# Patient Record
Sex: Female | Born: 1965 | Race: Black or African American | Hispanic: No | Marital: Single | Smoking: Former smoker
Health system: Southern US, Community
[De-identification: ages and names within clinical notes are randomized; demographics above are authoritative.]

## PROBLEM LIST (undated history)

## (undated) DIAGNOSIS — E119 Type 2 diabetes mellitus without complications: Secondary | ICD-10-CM

## (undated) DIAGNOSIS — I10 Essential (primary) hypertension: Secondary | ICD-10-CM

## (undated) DIAGNOSIS — J45909 Unspecified asthma, uncomplicated: Secondary | ICD-10-CM

## (undated) DIAGNOSIS — K529 Noninfective gastroenteritis and colitis, unspecified: Secondary | ICD-10-CM

## (undated) HISTORY — DX: Noninfective gastroenteritis and colitis, unspecified: K52.9

---

## 2019-11-15 DIAGNOSIS — G5603 Carpal tunnel syndrome, bilateral upper limbs: Secondary | ICD-10-CM | POA: Insufficient documentation

## 2019-11-15 DIAGNOSIS — Z720 Tobacco use: Secondary | ICD-10-CM | POA: Insufficient documentation

## 2019-11-15 DIAGNOSIS — J302 Other seasonal allergic rhinitis: Secondary | ICD-10-CM | POA: Insufficient documentation

## 2019-11-15 DIAGNOSIS — G4733 Obstructive sleep apnea (adult) (pediatric): Secondary | ICD-10-CM | POA: Insufficient documentation

## 2019-11-15 DIAGNOSIS — I10 Essential (primary) hypertension: Secondary | ICD-10-CM | POA: Insufficient documentation

## 2019-11-15 DIAGNOSIS — E1169 Type 2 diabetes mellitus with other specified complication: Secondary | ICD-10-CM | POA: Insufficient documentation

## 2019-11-15 DIAGNOSIS — E119 Type 2 diabetes mellitus without complications: Secondary | ICD-10-CM | POA: Insufficient documentation

## 2019-11-15 DIAGNOSIS — G4700 Insomnia, unspecified: Secondary | ICD-10-CM | POA: Insufficient documentation

## 2019-11-15 DIAGNOSIS — J4520 Mild intermittent asthma, uncomplicated: Secondary | ICD-10-CM | POA: Insufficient documentation

## 2020-12-12 DIAGNOSIS — J386 Stenosis of larynx: Secondary | ICD-10-CM

## 2020-12-12 HISTORY — DX: Stenosis of larynx: J38.6

## 2021-09-20 ENCOUNTER — Emergency Department (HOSPITAL_COMMUNITY): Payer: Medicare Other

## 2021-09-20 ENCOUNTER — Encounter (HOSPITAL_COMMUNITY): Payer: Self-pay

## 2021-09-20 ENCOUNTER — Other Ambulatory Visit (HOSPITAL_COMMUNITY): Payer: Medicare Other

## 2021-09-20 ENCOUNTER — Inpatient Hospital Stay (HOSPITAL_COMMUNITY): Payer: Medicare Other

## 2021-09-20 ENCOUNTER — Inpatient Hospital Stay (HOSPITAL_COMMUNITY)
Admission: EM | Admit: 2021-09-20 | Discharge: 2021-11-15 | DRG: 003 | Disposition: A | Discharge status: Home Health | Payer: Medicare Other | Attending: Internal Medicine | Admitting: Internal Medicine

## 2021-09-20 ENCOUNTER — Other Ambulatory Visit: Payer: Self-pay

## 2021-09-20 DIAGNOSIS — R0902 Hypoxemia: Secondary | ICD-10-CM

## 2021-09-20 DIAGNOSIS — Z930 Tracheostomy status: Secondary | ICD-10-CM

## 2021-09-20 DIAGNOSIS — Z79899 Other long term (current) drug therapy: Secondary | ICD-10-CM

## 2021-09-20 DIAGNOSIS — J9601 Acute respiratory failure with hypoxia: Secondary | ICD-10-CM | POA: Diagnosis present

## 2021-09-20 DIAGNOSIS — G9340 Encephalopathy, unspecified: Secondary | ICD-10-CM

## 2021-09-20 DIAGNOSIS — J4551 Severe persistent asthma with (acute) exacerbation: Secondary | ICD-10-CM | POA: Diagnosis not present

## 2021-09-20 DIAGNOSIS — I161 Hypertensive emergency: Secondary | ICD-10-CM | POA: Diagnosis present

## 2021-09-20 DIAGNOSIS — I160 Hypertensive urgency: Secondary | ICD-10-CM | POA: Diagnosis not present

## 2021-09-20 DIAGNOSIS — T380X5A Adverse effect of glucocorticoids and synthetic analogues, initial encounter: Secondary | ICD-10-CM | POA: Diagnosis not present

## 2021-09-20 DIAGNOSIS — J384 Edema of larynx: Secondary | ICD-10-CM | POA: Diagnosis not present

## 2021-09-20 DIAGNOSIS — R0603 Acute respiratory distress: Secondary | ICD-10-CM

## 2021-09-20 DIAGNOSIS — I249 Acute ischemic heart disease, unspecified: Secondary | ICD-10-CM

## 2021-09-20 DIAGNOSIS — N184 Chronic kidney disease, stage 4 (severe): Secondary | ICD-10-CM | POA: Diagnosis present

## 2021-09-20 DIAGNOSIS — J810 Acute pulmonary edema: Secondary | ICD-10-CM | POA: Diagnosis present

## 2021-09-20 DIAGNOSIS — K3184 Gastroparesis: Secondary | ICD-10-CM | POA: Diagnosis not present

## 2021-09-20 DIAGNOSIS — I4510 Unspecified right bundle-branch block: Secondary | ICD-10-CM | POA: Diagnosis present

## 2021-09-20 DIAGNOSIS — E1143 Type 2 diabetes mellitus with diabetic autonomic (poly)neuropathy: Secondary | ICD-10-CM | POA: Diagnosis present

## 2021-09-20 DIAGNOSIS — R140 Abdominal distension (gaseous): Secondary | ICD-10-CM

## 2021-09-20 DIAGNOSIS — Z781 Physical restraint status: Secondary | ICD-10-CM

## 2021-09-20 DIAGNOSIS — D6489 Other specified anemias: Secondary | ICD-10-CM | POA: Diagnosis not present

## 2021-09-20 DIAGNOSIS — G928 Other toxic encephalopathy: Principal | ICD-10-CM | POA: Diagnosis present

## 2021-09-20 DIAGNOSIS — I469 Cardiac arrest, cause unspecified: Secondary | ICD-10-CM | POA: Diagnosis not present

## 2021-09-20 DIAGNOSIS — G4700 Insomnia, unspecified: Secondary | ICD-10-CM | POA: Diagnosis not present

## 2021-09-20 DIAGNOSIS — E1165 Type 2 diabetes mellitus with hyperglycemia: Secondary | ICD-10-CM | POA: Diagnosis present

## 2021-09-20 DIAGNOSIS — J189 Pneumonia, unspecified organism: Secondary | ICD-10-CM | POA: Diagnosis not present

## 2021-09-20 DIAGNOSIS — J440 Chronic obstructive pulmonary disease with acute lower respiratory infection: Secondary | ICD-10-CM | POA: Diagnosis present

## 2021-09-20 DIAGNOSIS — E669 Obesity, unspecified: Secondary | ICD-10-CM | POA: Diagnosis present

## 2021-09-20 DIAGNOSIS — J45901 Unspecified asthma with (acute) exacerbation: Secondary | ICD-10-CM | POA: Diagnosis not present

## 2021-09-20 DIAGNOSIS — R0602 Shortness of breath: Secondary | ICD-10-CM

## 2021-09-20 DIAGNOSIS — Y95 Nosocomial condition: Secondary | ICD-10-CM | POA: Diagnosis present

## 2021-09-20 DIAGNOSIS — F05 Delirium due to known physiological condition: Secondary | ICD-10-CM | POA: Diagnosis not present

## 2021-09-20 DIAGNOSIS — H510 Palsy (spasm) of conjugate gaze: Secondary | ICD-10-CM | POA: Diagnosis present

## 2021-09-20 DIAGNOSIS — Z6838 Body mass index (BMI) 38.0-38.9, adult: Secondary | ICD-10-CM | POA: Diagnosis not present

## 2021-09-20 DIAGNOSIS — Z01818 Encounter for other preprocedural examination: Secondary | ICD-10-CM

## 2021-09-20 DIAGNOSIS — J9503 Malfunction of tracheostomy stoma: Secondary | ICD-10-CM | POA: Diagnosis present

## 2021-09-20 DIAGNOSIS — J4541 Moderate persistent asthma with (acute) exacerbation: Secondary | ICD-10-CM | POA: Diagnosis not present

## 2021-09-20 DIAGNOSIS — E1122 Type 2 diabetes mellitus with diabetic chronic kidney disease: Secondary | ICD-10-CM | POA: Diagnosis present

## 2021-09-20 DIAGNOSIS — I129 Hypertensive chronic kidney disease with stage 1 through stage 4 chronic kidney disease, or unspecified chronic kidney disease: Secondary | ICD-10-CM | POA: Diagnosis present

## 2021-09-20 DIAGNOSIS — R092 Respiratory arrest: Secondary | ICD-10-CM | POA: Diagnosis not present

## 2021-09-20 DIAGNOSIS — R490 Dysphonia: Secondary | ICD-10-CM | POA: Diagnosis not present

## 2021-09-20 DIAGNOSIS — E11649 Type 2 diabetes mellitus with hypoglycemia without coma: Secondary | ICD-10-CM | POA: Diagnosis not present

## 2021-09-20 DIAGNOSIS — B370 Candidal stomatitis: Secondary | ICD-10-CM | POA: Diagnosis present

## 2021-09-20 DIAGNOSIS — J9600 Acute respiratory failure, unspecified whether with hypoxia or hypercapnia: Secondary | ICD-10-CM | POA: Diagnosis not present

## 2021-09-20 DIAGNOSIS — K219 Gastro-esophageal reflux disease without esophagitis: Secondary | ICD-10-CM | POA: Diagnosis present

## 2021-09-20 DIAGNOSIS — R061 Stridor: Secondary | ICD-10-CM | POA: Diagnosis not present

## 2021-09-20 DIAGNOSIS — N179 Acute kidney failure, unspecified: Secondary | ICD-10-CM | POA: Diagnosis not present

## 2021-09-20 DIAGNOSIS — Z4659 Encounter for fitting and adjustment of other gastrointestinal appliance and device: Secondary | ICD-10-CM

## 2021-09-20 DIAGNOSIS — I468 Cardiac arrest due to other underlying condition: Secondary | ICD-10-CM | POA: Diagnosis present

## 2021-09-20 DIAGNOSIS — R1313 Dysphagia, pharyngeal phase: Secondary | ICD-10-CM | POA: Diagnosis not present

## 2021-09-20 DIAGNOSIS — N1832 Chronic kidney disease, stage 3b: Secondary | ICD-10-CM | POA: Diagnosis not present

## 2021-09-20 DIAGNOSIS — N170 Acute kidney failure with tubular necrosis: Secondary | ICD-10-CM | POA: Diagnosis present

## 2021-09-20 DIAGNOSIS — F17210 Nicotine dependence, cigarettes, uncomplicated: Secondary | ICD-10-CM | POA: Diagnosis present

## 2021-09-20 DIAGNOSIS — R339 Retention of urine, unspecified: Secondary | ICD-10-CM | POA: Diagnosis not present

## 2021-09-20 DIAGNOSIS — I152 Hypertension secondary to endocrine disorders: Secondary | ICD-10-CM | POA: Diagnosis not present

## 2021-09-20 DIAGNOSIS — Z0189 Encounter for other specified special examinations: Secondary | ICD-10-CM

## 2021-09-20 DIAGNOSIS — R1312 Dysphagia, oropharyngeal phase: Secondary | ICD-10-CM | POA: Diagnosis not present

## 2021-09-20 DIAGNOSIS — D649 Anemia, unspecified: Secondary | ICD-10-CM | POA: Diagnosis not present

## 2021-09-20 DIAGNOSIS — Z978 Presence of other specified devices: Secondary | ICD-10-CM | POA: Diagnosis not present

## 2021-09-20 DIAGNOSIS — F419 Anxiety disorder, unspecified: Secondary | ICD-10-CM | POA: Diagnosis not present

## 2021-09-20 DIAGNOSIS — Z20822 Contact with and (suspected) exposure to covid-19: Secondary | ICD-10-CM | POA: Diagnosis present

## 2021-09-20 DIAGNOSIS — E875 Hyperkalemia: Secondary | ICD-10-CM | POA: Diagnosis not present

## 2021-09-20 DIAGNOSIS — J45909 Unspecified asthma, uncomplicated: Secondary | ICD-10-CM

## 2021-09-20 DIAGNOSIS — R739 Hyperglycemia, unspecified: Secondary | ICD-10-CM | POA: Diagnosis not present

## 2021-09-20 DIAGNOSIS — E119 Type 2 diabetes mellitus without complications: Secondary | ICD-10-CM | POA: Diagnosis not present

## 2021-09-20 DIAGNOSIS — J383 Other diseases of vocal cords: Secondary | ICD-10-CM | POA: Diagnosis not present

## 2021-09-20 DIAGNOSIS — Z794 Long term (current) use of insulin: Secondary | ICD-10-CM

## 2021-09-20 DIAGNOSIS — J386 Stenosis of larynx: Secondary | ICD-10-CM | POA: Diagnosis present

## 2021-09-20 DIAGNOSIS — E1159 Type 2 diabetes mellitus with other circulatory complications: Secondary | ICD-10-CM | POA: Diagnosis not present

## 2021-09-20 DIAGNOSIS — I10 Essential (primary) hypertension: Secondary | ICD-10-CM | POA: Diagnosis not present

## 2021-09-20 DIAGNOSIS — Z430 Encounter for attention to tracheostomy: Secondary | ICD-10-CM

## 2021-09-20 DIAGNOSIS — R112 Nausea with vomiting, unspecified: Secondary | ICD-10-CM | POA: Diagnosis not present

## 2021-09-20 HISTORY — DX: Type 2 diabetes mellitus without complications: E11.9

## 2021-09-20 HISTORY — DX: Essential (primary) hypertension: I10

## 2021-09-20 HISTORY — DX: Unspecified asthma, uncomplicated: J45.909

## 2021-09-20 HISTORY — DX: Encephalopathy, unspecified: G93.40

## 2021-09-20 LAB — CBC
HCT: 41.5 % (ref 36.0–46.0)
Hemoglobin: 13.3 g/dL (ref 12.0–15.0)
MCH: 28.9 pg (ref 26.0–34.0)
MCHC: 32 g/dL (ref 30.0–36.0)
MCV: 90.2 fL (ref 80.0–100.0)
Platelets: 227 10*3/uL (ref 150–400)
RBC: 4.6 MIL/uL (ref 3.87–5.11)
RDW: 13.1 % (ref 11.5–15.5)
WBC: 14.4 10*3/uL — ABNORMAL HIGH (ref 4.0–10.5)
nRBC: 0 % (ref 0.0–0.2)

## 2021-09-20 LAB — ECHOCARDIOGRAM COMPLETE
AR max vel: 1.92 cm2
AV Area VTI: 2.28 cm2
AV Area mean vel: 1.95 cm2
AV Mean grad: 6 mmHg
AV Peak grad: 11.7 mmHg
Ao pk vel: 1.71 m/s
Area-P 1/2: 2.93 cm2
Height: 66 in
MV VTI: 2.58 cm2
S' Lateral: 2.4 cm
Weight: 3527.36 oz

## 2021-09-20 LAB — COMPREHENSIVE METABOLIC PANEL
ALT: 15 U/L (ref 0–44)
AST: 31 U/L (ref 15–41)
Albumin: 3.1 g/dL — ABNORMAL LOW (ref 3.5–5.0)
Alkaline Phosphatase: 104 U/L (ref 38–126)
Anion gap: 12 (ref 5–15)
BUN: 36 mg/dL — ABNORMAL HIGH (ref 6–20)
CO2: 26 mmol/L (ref 22–32)
Calcium: 9.8 mg/dL (ref 8.9–10.3)
Chloride: 98 mmol/L (ref 98–111)
Creatinine, Ser: 2.17 mg/dL — ABNORMAL HIGH (ref 0.44–1.00)
GFR, Estimated: 26 mL/min — ABNORMAL LOW (ref >60)
Glucose, Bld: 457 mg/dL — ABNORMAL HIGH (ref 70–99)
Potassium: 4.4 mmol/L (ref 3.5–5.1)
Sodium: 136 mmol/L (ref 135–145)
Total Bilirubin: 0.9 mg/dL (ref 0.3–1.2)
Total Protein: 6.9 g/dL (ref 6.5–8.1)

## 2021-09-20 LAB — DIFFERENTIAL
Abs Immature Granulocytes: 0.07 10*3/uL (ref 0.00–0.07)
Basophils Absolute: 0 10*3/uL (ref 0.0–0.1)
Basophils Relative: 0 %
Eosinophils Absolute: 0.1 10*3/uL (ref 0.0–0.5)
Eosinophils Relative: 1 %
Immature Granulocytes: 1 %
Lymphocytes Relative: 19 %
Lymphs Abs: 2.7 10*3/uL (ref 0.7–4.0)
Monocytes Absolute: 0.6 10*3/uL (ref 0.1–1.0)
Monocytes Relative: 4 %
Neutro Abs: 10.9 10*3/uL — ABNORMAL HIGH (ref 1.7–7.7)
Neutrophils Relative %: 75 %

## 2021-09-20 LAB — I-STAT ARTERIAL BLOOD GAS, ED
Acid-Base Excess: 1 mmol/L (ref 0.0–2.0)
Bicarbonate: 26 mmol/L (ref 20.0–28.0)
Calcium, Ion: 1.24 mmol/L (ref 1.15–1.40)
HCT: 41 % (ref 36.0–46.0)
Hemoglobin: 13.9 g/dL (ref 12.0–15.0)
O2 Saturation: 100 %
Patient temperature: 98.9
Potassium: 3.8 mmol/L (ref 3.5–5.1)
Sodium: 135 mmol/L (ref 135–145)
TCO2: 27 mmol/L (ref 22–32)
pCO2 arterial: 40.7 mmHg (ref 32.0–48.0)
pH, Arterial: 7.414 (ref 7.350–7.450)
pO2, Arterial: 230 mmHg — ABNORMAL HIGH (ref 83.0–108.0)

## 2021-09-20 LAB — HEMOGLOBIN A1C
Hgb A1c MFr Bld: 12.7 % — ABNORMAL HIGH (ref 4.8–5.6)
Mean Plasma Glucose: 317.79 mg/dL

## 2021-09-20 LAB — RAPID URINE DRUG SCREEN, HOSP PERFORMED
Amphetamines: NOT DETECTED
Barbiturates: NOT DETECTED
Benzodiazepines: NOT DETECTED
Cocaine: NOT DETECTED
Opiates: NOT DETECTED
Tetrahydrocannabinol: NOT DETECTED

## 2021-09-20 LAB — URINALYSIS, ROUTINE W REFLEX MICROSCOPIC
Bilirubin Urine: NEGATIVE
Bilirubin Urine: NEGATIVE
Glucose, UA: >=500 mg/dL — AB
Glucose, UA: >=500 mg/dL — AB
Ketones, ur: NEGATIVE mg/dL
Ketones, ur: NEGATIVE mg/dL
Leukocytes,Ua: NEGATIVE
Leukocytes,Ua: NEGATIVE
Nitrite: NEGATIVE
Nitrite: NEGATIVE
Protein, ur: >=300 mg/dL — AB
Protein, ur: 100 mg/dL — AB
Specific Gravity, Urine: 1.022 (ref 1.005–1.030)
Specific Gravity, Urine: 1.012 (ref 1.005–1.030)
pH: 6 (ref 5.0–8.0)
pH: 5 (ref 5.0–8.0)

## 2021-09-20 LAB — GLUCOSE, CAPILLARY
Glucose-Capillary: 197 mg/dL — ABNORMAL HIGH (ref 70–99)
Glucose-Capillary: 197 mg/dL — ABNORMAL HIGH (ref 70–99)
Glucose-Capillary: 217 mg/dL — ABNORMAL HIGH (ref 70–99)
Glucose-Capillary: 219 mg/dL — ABNORMAL HIGH (ref 70–99)
Glucose-Capillary: 230 mg/dL — ABNORMAL HIGH (ref 70–99)
Glucose-Capillary: 293 mg/dL — ABNORMAL HIGH (ref 70–99)
Glucose-Capillary: 330 mg/dL — ABNORMAL HIGH (ref 70–99)
Glucose-Capillary: 376 mg/dL — ABNORMAL HIGH (ref 70–99)
Glucose-Capillary: 399 mg/dL — ABNORMAL HIGH (ref 70–99)
Glucose-Capillary: 438 mg/dL — ABNORMAL HIGH (ref 70–99)
Glucose-Capillary: 466 mg/dL — ABNORMAL HIGH (ref 70–99)
Glucose-Capillary: 516 mg/dL (ref 70–99)

## 2021-09-20 LAB — CBG MONITORING, ED
Glucose-Capillary: 477 mg/dL — ABNORMAL HIGH (ref 70–99)
Glucose-Capillary: 470 mg/dL — ABNORMAL HIGH (ref 70–99)
Glucose-Capillary: 547 mg/dL (ref 70–99)

## 2021-09-20 LAB — PROTIME-INR
INR: 0.9 (ref 0.8–1.2)
Prothrombin Time: 12.4 seconds (ref 11.4–15.2)

## 2021-09-20 LAB — HIV ANTIBODY (ROUTINE TESTING W REFLEX): HIV Screen 4th Generation wRfx: NONREACTIVE

## 2021-09-20 LAB — AMMONIA: Ammonia: 30 umol/L (ref 9–35)

## 2021-09-20 LAB — I-STAT CHEM 8, ED
BUN: 52 mg/dL — ABNORMAL HIGH (ref 6–20)
Calcium, Ion: 1.08 mmol/L — ABNORMAL LOW (ref 1.15–1.40)
Chloride: 104 mmol/L (ref 98–111)
Creatinine, Ser: 2.2 mg/dL — ABNORMAL HIGH (ref 0.44–1.00)
Glucose, Bld: 458 mg/dL — ABNORMAL HIGH (ref 70–99)
HCT: 41 % (ref 36.0–46.0)
Hemoglobin: 13.9 g/dL (ref 12.0–15.0)
Potassium: 4.6 mmol/L (ref 3.5–5.1)
Sodium: 136 mmol/L (ref 135–145)
TCO2: 28 mmol/L (ref 22–32)

## 2021-09-20 LAB — I-STAT BETA HCG BLOOD, ED (MC, WL, AP ONLY): I-stat hCG, quantitative: <5 m[IU]/mL (ref <5)

## 2021-09-20 LAB — ETHANOL: Alcohol, Ethyl (B): <10 mg/dL (ref <10)

## 2021-09-20 LAB — APTT: aPTT: 27 seconds (ref 24–36)

## 2021-09-20 LAB — MRSA NEXT GEN BY PCR, NASAL: MRSA by PCR Next Gen: NOT DETECTED

## 2021-09-20 LAB — OSMOLALITY: Osmolality: 333 mOsm/kg (ref 275–295)

## 2021-09-20 LAB — RESP PANEL BY RT-PCR (FLU A&B, COVID) ARPGX2
Influenza A by PCR: NEGATIVE
Influenza B by PCR: NEGATIVE
SARS Coronavirus 2 by RT PCR: NEGATIVE

## 2021-09-20 LAB — CK: Total CK: 316 U/L — ABNORMAL HIGH (ref 38–234)

## 2021-09-20 LAB — BETA-HYDROXYBUTYRIC ACID: Beta-Hydroxybutyric Acid: 0.36 mmol/L — ABNORMAL HIGH (ref 0.05–0.27)

## 2021-09-20 LAB — MAGNESIUM: Magnesium: 1.9 mg/dL (ref 1.7–2.4)

## 2021-09-20 MED ORDER — AMLODIPINE BESYLATE 5 MG PO TABS
5.0000 mg | ORAL_TABLET | Freq: Every day | ORAL | Status: DC
  Administered 2021-09-20: 5 mg
  Filled 2021-09-20: qty 1

## 2021-09-20 MED ORDER — DOCUSATE SODIUM 50 MG/5ML PO LIQD
100.0000 mg | Freq: Two times a day (BID) | ORAL | Status: DC | PRN
  Administered 2021-09-22: 100 mg
  Filled 2021-09-20: qty 10

## 2021-09-20 MED ORDER — ETOMIDATE 2 MG/ML IV SOLN
30.0000 mg | Freq: Once | INTRAVENOUS | Status: AC
  Administered 2021-09-20: 30 mg via INTRAVENOUS
  Filled 2021-09-20: qty 20

## 2021-09-20 MED ORDER — ORAL CARE MOUTH RINSE
15.0000 mL | OROMUCOSAL | Status: DC
  Administered 2021-09-20 – 2021-09-25 (×53): 15 mL via OROMUCOSAL

## 2021-09-20 MED ORDER — INSULIN REGULAR(HUMAN) IN NACL 100-0.9 UT/100ML-% IV SOLN
INTRAVENOUS | Status: AC
  Administered 2021-09-20: 5 [IU]/h via INTRAVENOUS
  Administered 2021-09-21: 4 [IU]/h via INTRAVENOUS
  Filled 2021-09-20 (×3): qty 100

## 2021-09-20 MED ORDER — FENTANYL CITRATE PF 50 MCG/ML IJ SOSY
100.0000 ug | PREFILLED_SYRINGE | INTRAMUSCULAR | Status: DC | PRN
  Administered 2021-09-20: 100 ug via INTRAVENOUS
  Filled 2021-09-20: qty 2

## 2021-09-20 MED ORDER — LORAZEPAM 2 MG/ML IJ SOLN
2.0000 mg | Freq: Once | INTRAMUSCULAR | Status: AC
  Administered 2021-09-20: 2 mg via INTRAVENOUS
  Filled 2021-09-20: qty 1

## 2021-09-20 MED ORDER — NICARDIPINE HCL IN NACL 20-0.86 MG/200ML-% IV SOLN
3.0000 mg/h | INTRAVENOUS | Status: DC
Start: 2021-09-20 — End: 2021-09-21
  Administered 2021-09-20: 5 mg/h via INTRAVENOUS
  Administered 2021-09-20: 2.5 mg/h via INTRAVENOUS
  Administered 2021-09-20 (×2): 12.5 mg/h via INTRAVENOUS
  Filled 2021-09-20 (×6): qty 200

## 2021-09-20 MED ORDER — ONDANSETRON HCL 4 MG/2ML IJ SOLN
4.0000 mg | Freq: Four times a day (QID) | INTRAMUSCULAR | Status: DC | PRN
  Administered 2021-10-09 – 2021-11-14 (×8): 4 mg via INTRAVENOUS
  Filled 2021-09-20 (×9): qty 2

## 2021-09-20 MED ORDER — POLYETHYLENE GLYCOL 3350 17 G PO PACK: 17.0000 g | PACK | Freq: Every day | ORAL | Status: DC | PRN

## 2021-09-20 MED ORDER — ACETAMINOPHEN 325 MG PO TABS
650.0000 mg | ORAL_TABLET | Freq: Four times a day (QID) | ORAL | Status: DC | PRN
  Filled 2021-09-20: qty 2

## 2021-09-20 MED ORDER — PROPOFOL 1000 MG/100ML IV EMUL
0.0000 ug/kg/min | INTRAVENOUS | Status: DC
  Administered 2021-09-20: 5 ug/kg/min via INTRAVENOUS
  Administered 2021-09-20: 40 ug/kg/min via INTRAVENOUS
  Administered 2021-09-20 – 2021-09-21 (×3): 30 ug/kg/min via INTRAVENOUS
  Filled 2021-09-20: qty 200
  Filled 2021-09-20 (×5): qty 100

## 2021-09-20 MED ORDER — DEXTROSE 50 % IV SOLN
0.0000 mL | INTRAVENOUS | Status: DC | PRN
  Administered 2021-09-24 – 2021-10-13 (×3): 50 mL via INTRAVENOUS
  Filled 2021-09-20 (×5): qty 50

## 2021-09-20 MED ORDER — ACETAMINOPHEN 325 MG PO TABS
650.0000 mg | ORAL_TABLET | Freq: Four times a day (QID) | ORAL | Status: DC | PRN
  Administered 2021-09-20 – 2021-09-21 (×3): 650 mg
  Filled 2021-09-20 (×2): qty 2

## 2021-09-20 MED ORDER — LABETALOL HCL 5 MG/ML IV SOLN
5.0000 mg | Freq: Once | INTRAVENOUS | Status: AC
  Administered 2021-09-20: 5 mg via INTRAVENOUS
  Filled 2021-09-20: qty 4

## 2021-09-20 MED ORDER — LABETALOL HCL 5 MG/ML IV SOLN
10.0000 mg | Freq: Once | INTRAVENOUS | Status: AC
  Administered 2021-09-20: 10 mg via INTRAVENOUS

## 2021-09-20 MED ORDER — DEXTROSE IN LACTATED RINGERS 5 % IV SOLN: INTRAVENOUS | Status: DC

## 2021-09-20 MED ORDER — AMLODIPINE BESYLATE 10 MG PO TABS
10.0000 mg | ORAL_TABLET | Freq: Every day | ORAL | Status: DC
  Administered 2021-09-21 – 2021-09-24 (×4): 10 mg
  Filled 2021-09-20 (×4): qty 1

## 2021-09-20 MED ORDER — PANTOPRAZOLE 2 MG/ML SUSPENSION
40.0000 mg | Freq: Every day | ORAL | Status: DC
  Administered 2021-09-20 – 2021-09-24 (×5): 40 mg
  Filled 2021-09-20 (×5): qty 20

## 2021-09-20 MED ORDER — CARVEDILOL 12.5 MG PO TABS: 12.5000 mg | ORAL_TABLET | Freq: Two times a day (BID) | ORAL | Status: DC

## 2021-09-20 MED ORDER — ROCURONIUM BROMIDE 10 MG/ML (PF) SYRINGE
PREFILLED_SYRINGE | INTRAVENOUS | Status: AC
  Administered 2021-09-20: 100 mg via INTRAVENOUS
  Filled 2021-09-20: qty 10

## 2021-09-20 MED ORDER — HEPARIN SODIUM (PORCINE) 5000 UNIT/ML IJ SOLN
5000.0000 [IU] | Freq: Three times a day (TID) | INTRAMUSCULAR | Status: DC
  Administered 2021-09-20 – 2021-10-14 (×74): 5000 [IU] via SUBCUTANEOUS
  Filled 2021-09-20 (×73): qty 1

## 2021-09-20 MED ORDER — ZIPRASIDONE MESYLATE 20 MG IM SOLR
INTRAMUSCULAR | Status: AC
  Administered 2021-09-20: 20 mg via INTRAMUSCULAR
  Filled 2021-09-20: qty 20

## 2021-09-20 MED ORDER — CHLORHEXIDINE GLUCONATE CLOTH 2 % EX PADS
6.0000 | MEDICATED_PAD | Freq: Every day | CUTANEOUS | Status: DC
Start: 2021-09-20 — End: 2021-11-08
  Administered 2021-09-20 – 2021-11-07 (×46): 6 via TOPICAL

## 2021-09-20 MED ORDER — FENTANYL CITRATE PF 50 MCG/ML IJ SOSY
50.0000 ug | PREFILLED_SYRINGE | INTRAMUSCULAR | Status: DC | PRN
  Administered 2021-09-20: 50 ug via INTRAVENOUS
  Filled 2021-09-20: qty 1

## 2021-09-20 MED ORDER — MIDAZOLAM HCL 2 MG/2ML IJ SOLN
2.0000 mg | INTRAMUSCULAR | Status: DC | PRN
Start: 2021-09-20 — End: 2021-09-20
  Filled 2021-09-20: qty 2

## 2021-09-20 MED ORDER — ROCURONIUM BROMIDE 10 MG/ML (PF) SYRINGE: 1.0000 mg/kg | PREFILLED_SYRINGE | Freq: Once | INTRAVENOUS | Status: AC

## 2021-09-20 MED ORDER — ZIPRASIDONE MESYLATE 20 MG IM SOLR: 20.0000 mg | Freq: Once | INTRAMUSCULAR | Status: AC

## 2021-09-20 MED ORDER — SODIUM CHLORIDE 0.9% FLUSH
3.0000 mL | Freq: Once | INTRAVENOUS | Status: AC
  Administered 2021-09-20: 3 mL via INTRAVENOUS

## 2021-09-20 MED ORDER — LACTATED RINGERS IV SOLN: INTRAVENOUS | Status: DC

## 2021-09-20 MED ORDER — FENTANYL CITRATE PF 50 MCG/ML IJ SOSY
100.0000 ug | PREFILLED_SYRINGE | INTRAMUSCULAR | Status: DC | PRN
  Administered 2021-09-21 (×2): 100 ug via INTRAVENOUS
  Filled 2021-09-20 (×4): qty 2

## 2021-09-20 MED ORDER — CHLORHEXIDINE GLUCONATE 0.12% ORAL RINSE (MEDLINE KIT)
15.0000 mL | Freq: Two times a day (BID) | OROMUCOSAL | Status: DC
  Administered 2021-09-20 – 2021-09-25 (×11): 15 mL via OROMUCOSAL

## 2021-09-20 MED ORDER — CARVEDILOL 12.5 MG PO TABS
12.5000 mg | ORAL_TABLET | Freq: Two times a day (BID) | ORAL | Status: DC
  Administered 2021-09-20: 12.5 mg
  Filled 2021-09-20: qty 1

## 2021-09-20 MED ORDER — FUROSEMIDE 10 MG/ML IJ SOLN
60.0000 mg | Freq: Once | INTRAMUSCULAR | Status: AC
  Administered 2021-09-20: 60 mg via INTRAVENOUS
  Filled 2021-09-20: qty 6

## 2021-09-20 MED ORDER — MIDAZOLAM HCL 2 MG/2ML IJ SOLN
2.0000 mg | INTRAMUSCULAR | Status: DC | PRN
  Administered 2021-09-20 – 2021-09-23 (×9): 2 mg via INTRAVENOUS
  Filled 2021-09-20 (×9): qty 2

## 2021-09-20 MED ORDER — FENTANYL CITRATE PF 50 MCG/ML IJ SOSY: 100.0000 ug | PREFILLED_SYRINGE | INTRAMUSCULAR | Status: DC | PRN

## 2021-09-20 MED ORDER — SODIUM CHLORIDE 0.9 % IV SOLN
2.0000 g | INTRAVENOUS | Status: DC
  Filled 2021-09-20 (×2): qty 20

## 2021-09-20 NOTE — Consult Note (Signed)
NEURO HOSPITALIST CONSULT NOTE   Requestig physician: Dr. Dayna Barker  Reason for Consult: Bizarre behavior  History obtained from:  EMS and Chart     HPI:                                                                                                                                          Maureen Jones is an 55 y.o. female who presented to the ED via EMS from home for assessment of bizarre, fluctuating behavior. LKN was 0200 when she was outside smoking with a family member (unknown what type of smoking implement or substance(s) used). She then went inside and family heard a loud boom coming from the room she was in. The patient was then found to be confused, pulling her clothes off and combative. EMS was called and on arrival to the home, the patient was unable to answer questions and seemed to be favoring her left side.   SBP was 166 on scene. HR 88. O2 saturation normal. CBG 456.   History reviewed. No pertinent past medical history.  History reviewed. No pertinent surgical history.  No family history on file.          Social History:  has no history on file for tobacco use, alcohol use, and drug use.  No Known Allergies  HOME MEDICATIONS:                                                                                                                     Unknown  ROS:  Unable to obtain due to AMS.    Blood pressure (!) 215/88, pulse 90, resp. rate (!) 33, weight 100 kg, SpO2 96 %.   General Examination:                                                                                                       Physical Exam  HEENT-  Normocephalic   Lungs- Respirations unlabored Extremities- No edema  Neurological Examination Mental Status: Initially patient with eyes closed and not answering questions or following commands,  appearing to have a depressed level of consciousness. After sustained sternal rub, patient opens eyes and starts exclaiming with a combined agitated/excited and sedated affect. She does not directly answer questions while in this state, but her exclamations are with intact grammar and syntax. Mild dysarthria which has quality that is suggestive of sedation. Not following commands when awakened and agitated.   Cranial Nerves: II: Does track. Briefly fixated on examiner after sternal rub. No definite blink to threat.  III,IV, VI: No ptosis. Eyes conjugate. No forced gaze deviation. Unable to formally assess EOM.  V: Unable to formally assess facial sensation.  VII: Face is symmetric at rest and when agitated.  VIII: Not responding to questions or commands IX,X: No hypophonia or hoarseness XI: Unable to assess. Will turn head to left and right while agitated XII: Does not protrude tongue to command. No lingual dysarthria.  Motor: Not following commands.  Moves BUE and BLE equally and forcefully when agitated.  Tone normal BUE and BLE when initially in a calm, unresponsive state.  Muscle bulk normal and symmetric x 4.  When agitated, was able to rise up to a seated position abruptly and rapidly without assistance, while on the CT scanner table   Sensory: Reacts to stimuli x 4.  Deep Tendon Reflexes: Unable to obtain due to agitation and thrashing. Toes downgoing bilaterally when initially not responding Cerebellar: Unable to formally assess. Spontaneous movements without ataxia.  Gait: Unable to assess   Lab Results: Basic Metabolic Panel: Recent Labs  Lab 09/20/21 0323 09/20/21 0330  NA 136 136  K 4.4 4.6  CL 98 104  CO2 26  --   GLUCOSE 457* 458*  BUN 36* 52*  CREATININE 2.17* 2.20*  CALCIUM 9.8  --     CBC: Recent Labs  Lab 09/20/21 0323 09/20/21 0330  WBC 14.4*  --   NEUTROABS 10.9*  --   HGB 13.3 13.9  HCT 41.5 41.0  MCV 90.2  --   PLT 227  --     Cardiac  Enzymes: No results for input(s): CKTOTAL, CKMB, CKMBINDEX, TROPONINI in the last 168 hours.  Lipid Panel: No results for input(s): CHOL, TRIG, HDL, CHOLHDL, VLDL, LDLCALC in the last 168 hours.  Imaging: CT HEAD CODE STROKE WO CONTRAST  Result Date: 09/20/2021 CLINICAL DATA:  Code stroke.  Right-sided weakness EXAM: CT HEAD WITHOUT CONTRAST TECHNIQUE: Contiguous axial images were obtained from the base of the skull through the vertex without intravenous contrast. COMPARISON:  None. FINDINGS: Brain: There is no mass, hemorrhage or extra-axial  collection. The size and configuration of the ventricles and extra-axial CSF spaces are normal. 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 Vascular: No abnormal hyperdensity of the major intracranial arteries or dural venous sinuses. No intracranial atherosclerosis. Skull: The visualized skull base, calvarium and extracranial soft tissues are normal. Sinuses/Orbits: No fluid levels or advanced mucosal thickening of the visualized paranasal sinuses. No mastoid or middle ear effusion. The orbits are normal. ASPECTS [facility] Stroke Program Early CT Score) - Ganglionic level infarction (caudate, lentiform nuclei, internal capsule, insula, M1-M3 cortex): 7 - Supraganglionic infarction (M4-M6 cortex): 3 Total score (0-10 with 10 being normal): 10 IMPRESSION: 1. No acute intracranial abnormality. 2. 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 3. ASPECTS is 10. These results were communicated to Dr. Kerney Elbe at 3:32 am on 09/20/2021 by text page via the [facility] messaging system. Electronically Signed   By: Ulyses Jarred M.D.   On: 09/20/2021 03:34     Assessment: 55 year old female presenting as a Code Stroke with acute onset of agitation with bizarre behavior 1. Exam reveals findings most consistent with an agitated delirium. DDx includes illicit substance intoxication and psychotic break. Low on the DDx but also possible would be an atypical  presentation of seizure. No focal weakness noted. Also with no tremor, jerking, posturing or twitching.   2. CT head: No acute intracranial abnormality. 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 ASPECTS is 10.  3. Overall presentation not consistent with stroke. No ataxia, ocular motility defect or pupillary abnormalities to suggest an unusual presentation of top of basilar syndrome or an artery of [place]on infarction. Risks of IV thrombolysis significantly outweigh potential benefits.   Recommendations: 1. [place] toxicology 2. Close monitoring with frequent neuro checks 3. May require sedation.  4. EEG 5. MRI brain when able.    Electronically signed: Dr. Kerney Elbe 09/20/2021, 4:19 AM

## 2021-09-20 NOTE — ED Triage Notes (Signed)
Pt arrives [facility] after family member called out for bizarre behavior. Per family pt went outside at 77 to smoke a cigarette with her nephew. Immediately afterwards pt went inside and family heard a "loud boom". Family then found pt confused, pulling clothes off and combative. Upon EMS arrival pt unable to answer questions and favoring her left extremities.

## 2021-09-20 NOTE — Progress Notes (Signed)
EEG done at bedside. Results pending.  

## 2021-09-20 NOTE — Code Documentation (Addendum)
Stroke Response Nurse Documentation Code Documentation  Maureen Jones is a 55 y.o. female arriving to [facility] ED via [facility] on 10/10 with past medical hx of HTN, DM. On No antithrombotic. Code stroke was activated by EMS.   Patient from home where she was LKW at 0200 after reportedly smoking a cigarette and now complaining of AMS, confusion. Pt is agitated and uncooperative making for a difficult exam. She is not following commands, however localizing to noxious stimuli bilaterally and stating responses like "that hurts" when stimulated.   Stroke team at the bedside on patient arrival. Labs drawn and patient cleared for CT by Dr. Dayna Barker. Patient to CT with team. NIHSS 4, see documentation for details and code stroke times. Patient with disoriented and not following commands on exam. The following imaging  was completed:  CT. Patient required frequent direction and reorientation for exam including medications to complete CT. Patient is not a candidate for IV Thrombolytic and not a candidate for IR due to No LVO symptoms.   Bedside handoff with ED RN Ysidro Evert.    Madelynn Done  Rapid Response RN

## 2021-09-20 NOTE — Procedures (Signed)
Patient Name: Maureen Jones  MRN: [id number]  Epilepsy Attending: Lora Havens  Referring Physician/Provider: Noe Gens, NP Date: 09/20/2021 Duration: 24.35 mins  Patient history: 55yo F with ams. EEG to evaluate for seizure  Level of alertness: comatose  AEDs during EEG study: Propofol  Technical aspects: This EEG study was done with scalp electrodes positioned according to the 10-20 International system of electrode placement. Electrical activity was acquired at a sampling rate of '500Hz'$  and reviewed with a high frequency filter of '70Hz'$  and a low frequency filter of '1Hz'$ . EEG data were recorded continuously and digitally stored.   Description: EEG showed continuous generalized background attenuation. Hyperventilation and photic stimulation were not performed.     Of note, EEG was technically difficult due to significant myogenic artifact.  ABNORMALITY - Background attenuation, generalized  IMPRESSION: This technically difficult study is suggestive of severe to profound diffuse encephalopathy, nonspecific etiology. No seizures or epileptiform discharges were seen throughout the recording.  If suspicion for ictal- interictal activity remains a concern, a prolonged study can be considered.   Camp [name]

## 2021-09-20 NOTE — Progress Notes (Signed)
Pt transported via vent to 4N to MRI & back with complications noted.

## 2021-09-20 NOTE — ED Notes (Signed)
Pt refused CXR and became combative with radiology.

## 2021-09-20 NOTE — Progress Notes (Signed)
  Echocardiogram 2D Echocardiogram has been performed.  Maureen Jones 09/20/2021, 5:10 PM

## 2021-09-20 NOTE — Progress Notes (Signed)
Belongings brought up with patient: shirt, underwear.

## 2021-09-20 NOTE — Progress Notes (Signed)
Inpatient Diabetes Program Recommendations  AACE/ADA: New Consensus Statement on Inpatient Glycemic Control (2015)  Target Ranges:  Prepandial:   less than 140 mg/dL      Peak postprandial:   less than 180 mg/dL (1-2 hours)      Critically ill patients:  140 - 180 mg/dL   Lab Results  Component Value Date   GLUCAP 470 (H) 09/20/2021    Review of Glycemic Control  Diabetes history: DM2 Outpatient Diabetes medications: Basaglar 60 units am, 65 units pm, Novolog 15 units tid meal coverage, 5 units ac smoothies, Metformin 850 mg bid Current orders for Inpatient glycemic control: IV insulin  Inpatient Diabetes Program Recommendations:   Agree with plan to start on IV insulin. Will follow regarding transition. See above for home regimen of insulin.  Thank you, Nani Gasser. Marquee Fuchs, RN, MSN, CDE  Diabetes Coordinator Inpatient Glycemic Control Team Team Pager [phone] (8am-5pm) 09/20/2021 9:56 AM

## 2021-09-20 NOTE — Plan of Care (Signed)
Neurology Plan of Care with addition to consult:  We were paged by the RN because patient had a downward gaze with a few slow beats of nystagmus in the OS. Asked RN to get the stat MRI brain ordered prior and if there was a delay with that, get a stat CT head. MRI was able to be done. Dr. Theda Sers reviewed MRI brain with no acute changes noted. Final read also showed no acute finding.  We examined patient's eyes. Downward gaze OU was found with a few slow beats of nystagmus with fatigability to OS. No new findings on MRIb to suggest emergent concerns, but will get MRA head and neck to assess intra/extracranial arteries and vascular deformities. This could also be metabolic issue.   Plan: MRA head and neck. Neuro will f/up results.    Clance Boll, MSN, APN-BC Neurology Nurse Practitioner Pager [phone]  Above discussed with and approved by Dr. Corine Shelter.

## 2021-09-20 NOTE — ED Notes (Signed)
Pt's sister called to get an update on patient, Maureen Jones [phone]

## 2021-09-20 NOTE — ED Provider Notes (Signed)
Blood pressure (!) 200/81, pulse (!) 112, temperature 98.8 F (37.1 C), temperature source Axillary, resp. rate (!) 24, height '5\' 6"'$  (1.676 m), weight 100 kg, SpO2 98 %.  Assuming care from Dr. Dayna Barker.  In short, Maureen Jones is a 55 y.o. female with a chief complaint of Code Stroke .  Refer to the original H&P for additional details.  The current plan of care is to follow up MRI. ICU team made aware and will follow/admit after MRI.     Margette Fast, MD 09/20/21 [phone]

## 2021-09-20 NOTE — H&P (Signed)
NAMETomecka Jones, MRN:  [id number], DOB:  1966/05/08, LOS: 0 ADMISSION DATE:  09/20/2021, CONSULTATION DATE: 10/10 REFERRING MD:  Dr. Laverta Baltimore, CHIEF COMPLAINT:  AMS   History of Present Illness:  55 y/o F who presented to [facility] on 10/10 with reports of acute altered mental status.   She was reportedly at home and went to smoke a cigarette 0200 with her nephew. She was apparently seen by her family at that time.  After smoking, the family heard a loud thud and she was found altered and confused.  On arrival to the ER she was noted to be agitated, confused, localized to pain and stated "that hurts" with stimulation.  She was seen initially as a CODE STROKE.  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 She was hypertensive with initial pressure of 240/112 with MAP of 136.  Initial labs showed glucose of 437, BUN 36, Sr CR 2.17, CK 316, ammonia 30, Mg 1.9, WBC 14.4. UDS was negative.  COVID & flu screening were negative.  CXR showed low lung volumes, ETT in good position, L>R interstitial opacities.     PCCM called for ICU admission.   Pertinent  Medical History  HTN DM   Significant Hospital Events: Including procedures, antibiotic start and stop dates in addition to other pertinent events   10/10 Admit with acute encephalopathy, hypertensive  Interim History / Subjective:  As above   Objective   Blood pressure (!) 143/84, pulse (!) 109, temperature 98.8 F (37.1 C), temperature source Axillary, resp. rate 19, height '5\' 6"'$  (1.676 m), weight 100 kg, SpO2 96 %.    Vent Mode: PRVC FiO2 (%):  [40 %-100 %] 40 % Set Rate:  [16 bmp-24 bmp] 16 bmp Vt Set:  [470 mL] 470 mL PEEP:  [5 cmH20] 5 cmH20 Plateau Pressure:  [14 cmH20-20 cmH20] 20 cmH20  No intake or output data in the 24 hours ending 09/20/21 0937 Filed Weights   09/20/21 0326  Weight: 100 kg    Examination: General: ill appearing adult female lying in bed on  vent HENT: ETT, pupils 80m, anicteric Lungs: non-labored on vent, lungs bilaterally coarse with rhonchi Cardiovascular: S1S2 RRR, no m/r/g Abdomen: protuberant, soft, bsx4 active  Extremities: warm/dry, no edema  Neuro: sedate on propofol, moves all extremities spontaneously, does not follow commands  Resolved Hospital Problem list     Assessment & Plan:   Acute Hypertensive Emergency  Initial BP 240/112, MAP 136  -admit to ICU -cardene gtt for MAP reduction of 25% 6-24h, goal MAP ~100 -avoid hypotension -add oral amlodipine to wean cardene gtt -assess ECHO given cardiomegaly on CXR  Acute Metabolic Encephalopathy  Suspect in setting of hypertensive emergency, possible PRES. CT head negative.  -MRI to rule out PRES -propofol for sedation in setting of hypertension -correct hyperglycemia  -frequent neuro exams  Acute Hypoxic Respiratory Failure  Rule out pulmonary edema vs Aspiration  Tobacco Abuse Low lung volumes on CXR -PRVC 8cc/kg -wean PEEP / fiO2 for sats >90% -follow CXR -lasix as below  -empiric rocephin for possible aspiration  -cessation counseling when able for tobacco abuse   AKI  10/2020 cr 1.7 -lasix 60 mg IV x1  -Trend BMP / urinary output -Replace electrolytes as indicated -Avoid nephrotoxic agents as able, ensure adequate renal perfusion  DM with Hyperglycemia Not DKA, doubt HHS given glucose.  Suspect uncontrolled DM with stress response.  -assess HgbA1c -begin insulin gtt -assess beta  hydroxy  Best Practice (right click and "Reselect all SmartList Selections" daily)  Diet/type: tubefeeds DVT prophylaxis: systemic heparin GI prophylaxis: PPI Lines: N/A Foley:  N/A Code Status:  full code Last date of multidisciplinary goals of care discussion: pending   Labs   CBC: Recent Labs  Lab 09/20/21 0323 09/20/21 0330 09/20/21 0715  WBC 14.4*  --   --   NEUTROABS 10.9*  --   --   HGB 13.3 13.9 13.9  HCT 41.5 41.0 41.0  MCV 90.2  --   --    PLT 227  --   --     Basic Metabolic Panel: Recent Labs  Lab 09/20/21 0323 09/20/21 0330 09/20/21 0644 09/20/21 0715  NA 136 136  --  135  K 4.4 4.6  --  3.8  CL 98 104  --   --   CO2 26  --   --   --   GLUCOSE 457* 458*  --   --   BUN 36* 52*  --   --   CREATININE 2.17* 2.20*  --   --   CALCIUM 9.8  --   --   --   MG  --   --  1.9  --    GFR: Estimated Creatinine Clearance: 34.9 mL/min (A) (by C-G formula based on SCr of 2.2 mg/dL (H)). Recent Labs  Lab 09/20/21 0323  WBC 14.4*    Liver Function Tests: Recent Labs  Lab 09/20/21 0323  AST 31  ALT 15  ALKPHOS 104  BILITOT 0.9  PROT 6.9  ALBUMIN 3.1*   No results for input(s): LIPASE, AMYLASE in the last 168 hours. Recent Labs  Lab 09/20/21 0644  AMMONIA 30    ABG    Component Value Date/Time   PHART 7.414 09/20/2021 0715   PCO2ART 40.7 09/20/2021 0715   PO2ART 230 (H) 09/20/2021 0715   HCO3 26.0 09/20/2021 0715   TCO2 27 09/20/2021 0715   O2SAT 100.0 09/20/2021 0715     Coagulation Profile: Recent Labs  Lab 09/20/21 0323  INR 0.9    Cardiac Enzymes: Recent Labs  Lab 09/20/21 0611  CKTOTAL 316*    HbA1C: No results found for: HGBA1C  CBG: Recent Labs  Lab 09/20/21 0321 09/20/21 0403  GLUCAP 477* 470*    Review of Systems:   Unable to complete as patient is altered on mechanical ventilation.   Past Medical History:  She,  has a past medical history of Diabetes ([place]) and Hypertension.   Surgical History:  History reviewed. No pertinent surgical history.   Social History:   reports that she has been smoking cigarettes. She does not have any smokeless tobacco history on file.   Family History:  Her family history is not on file.   Allergies No Known Allergies   Home Medications  Prior to Admission medications   Not on File     Critical care time: 36 minutes     Noe Gens, MSN, APRN, NP-C, AGACNP-BC [facility] 09/20/2021, 9:37  AM   Please see [email or web address] for pager details.   From 7A-7P if no response, please call [phone] After hours, please call ELink [phone]

## 2021-09-20 NOTE — Procedures (Signed)
Intubation Procedure Note  Devondra Nakano  [id number]  1966/04/10  Date:09/20/21  Time:6:16 AM   Provider Performing:Warren Kugelman, Raelyn Number    Procedure: Intubation ([id number])  Indication(s) Respiratory Failure  Consent Unable to obtain consent due to emergent nature of procedure.   Anesthesia Per bedsideMD   Time Out Verified patient identification, verified procedure, site/side was marked, verified correct patient position, special equipment/implants available, medications/allergies/relevant history reviewed, required imaging and test results available.   Sterile Technique Usual hand hygeine, masks, and gloves were used   Procedure Description Patient positioned in bed supine.  Sedation given as noted above.  Patient was intubated with endotracheal tube using Glidescope.  View was Grade 1 full glottis .  Number of attempts was 1.  Colorimetric CO2 detector was consistent with tracheal placement.   Complications/Tolerance None; patient tolerated the procedure well. Chest X-ray is ordered to verify placement.   EBL Minimal   Specimen(s) None

## 2021-09-20 NOTE — Progress Notes (Signed)
Pt transported from the ED via vent to 4N ICU. RT notified of transport. No complications noted.

## 2021-09-20 NOTE — ED Provider Notes (Signed)
[facility] EMERGENCY DEPARTMENT Provider Note   CSN: [id number] Arrival date & time: 09/20/21  [id number]  An emergency department physician performed an initial assessment on this suspected stroke patient at 0317.  History Chief Complaint  Patient presents with   Code Stroke    Maureen Jones is a 55 y.o. female.  Last seen normal at 0200 when smoking cigarette. Now is altered with reported right sided weakness. Not able to assess secondary to mental status and not following commands.    Altered Mental Status Presenting symptoms: behavior changes   Severity:  Mild Episode history:  Single Timing:  Constant Progression:  Worsening Chronicity:  New     History reviewed. No pertinent past medical history.  There are no problems to display for this patient.  More information obtained from his sister and niece and nephew.  Apparently patient was smoking a cigarette on 2:00.  Niece and nephew heard her vomit.  When they went into the room where she was vomiting she collapsed and fell.  She states that she was making moaning and groaning type noises.  At that time is when the sister went in there and saw the same thing.  Patient was unresponsive.  And that when EMS showed up.  She adamantly denies any recent illnesses, trauma, drug use, alcohol use or any other possible precipitating factor.  OB History   No obstetric history on file.     No family history on file.  Social History   Tobacco Use   Smoking status: Unknown    Home Medications Prior to Admission medications   Not on File    Allergies    Patient has no known allergies.  Review of Systems   Review of Systems  Unable to perform ROS: Mental status change   Physical Exam Updated Vital Signs BP (!) 134/114 (BP Location: Right Leg)   Pulse (!) 109   Temp 98.8 F (37.1 C) (Axillary)   Resp (!) 25   Ht '5\' 6"'$  (1.676 m)   Wt 100 kg   SpO2 94%   BMI 35.58 kg/m   Physical  Exam Vitals and nursing note reviewed.  Constitutional:      Appearance: She is well-developed.  HENT:     Head: Normocephalic and atraumatic.     Mouth/Throat:     Pharynx: No oropharyngeal exudate or posterior oropharyngeal erythema.  Eyes:     Conjunctiva/sclera: Conjunctivae normal.     Pupils: Pupils are equal, round, and reactive to light.  Cardiovascular:     Rate and Rhythm: Normal rate and regular rhythm.  Pulmonary:     Effort: No respiratory distress.     Breath sounds: No stridor.  Abdominal:     General: Abdomen is flat. There is no distension.  Musculoskeletal:     Cervical back: Normal range of motion.  Neurological:     Mental Status: She is alert.     Comments: Not following commands. No obvious facial droop. Using right side slightly less but still is able to use it and being restrained by paramedics/EMS.    ED Results / Procedures / Treatments   Labs (all labs ordered are listed, but only abnormal results are displayed) Labs Reviewed  CBC - Abnormal; Notable for the following components:      Result Value   WBC 14.4 (*)    All other components within normal limits  DIFFERENTIAL - Abnormal; Notable for the following components:   Neutro Abs 10.9 (*)  All other components within normal limits  COMPREHENSIVE METABOLIC PANEL - Abnormal; Notable for the following components:   Glucose, Bld 457 (*)    BUN 36 (*)    Creatinine, Ser 2.17 (*)    Albumin 3.1 (*)    GFR, Estimated 26 (*)    All other components within normal limits  URINALYSIS, ROUTINE W REFLEX MICROSCOPIC - Abnormal; Notable for the following components:   Glucose, UA >=500 (*)    Hgb urine dipstick SMALL (*)    Protein, ur >=300 (*)    Bacteria, UA RARE (*)    All other components within normal limits  I-STAT CHEM 8, ED - Abnormal; Notable for the following components:   BUN 52 (*)    Creatinine, Ser 2.20 (*)    Glucose, Bld 458 (*)    Calcium, Ion 1.08 (*)    All other components  within normal limits  CBG MONITORING, ED - Abnormal; Notable for the following components:   Glucose-Capillary 477 (*)    All other components within normal limits  CBG MONITORING, ED - Abnormal; Notable for the following components:   Glucose-Capillary 470 (*)    All other components within normal limits  RESP PANEL BY RT-PCR (FLU A&B, COVID) ARPGX2  PROTIME-INR  APTT  RAPID URINE DRUG SCREEN, HOSP PERFORMED  I-STAT BETA HCG BLOOD, ED ([facility], [facility], [facility] ONLY)    EKG EKG Interpretation  Date/Time:  Monday September 20 2021 03:50:33 EDT Ventricular Rate:  95 PR Interval:  160 QRS Duration: 153 QT Interval:  397 QTC Calculation: 500 R Axis:   55 Text Interpretation: Sinus rhythm Right atrial enlargement Right bundle branch block Confirmed by Merrily Pew [phone]) on 09/20/2021 4:00:59 AM  Radiology CT HEAD CODE STROKE WO CONTRAST  Result Date: 09/20/2021 CLINICAL DATA:  Code stroke.  Right-sided weakness EXAM: CT HEAD WITHOUT CONTRAST TECHNIQUE: Contiguous axial images were obtained from the base of the skull through the vertex without intravenous contrast. COMPARISON:  None. FINDINGS: Brain: There is no mass, hemorrhage or extra-axial collection. The size and configuration of the ventricles and extra-axial CSF spaces are normal. 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 Vascular: No abnormal hyperdensity of the major intracranial arteries or dural venous sinuses. No intracranial atherosclerosis. Skull: The visualized skull base, calvarium and extracranial soft tissues are normal. Sinuses/Orbits: No fluid levels or advanced mucosal thickening of the visualized paranasal sinuses. No mastoid or middle ear effusion. The orbits are normal. ASPECTS [facility] Stroke Program Early CT Score) - Ganglionic level infarction (caudate, lentiform nuclei, internal capsule, insula, M1-M3 cortex): 7 - Supraganglionic infarction (M4-M6 cortex): 3 Total score (0-10 with 10 being normal): 10 IMPRESSION: 1.  No acute intracranial abnormality. 2. 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 3. ASPECTS is 10. These results were communicated to Dr. Kerney Elbe at 3:32 am on 09/20/2021 by text page via the [facility] messaging system. Electronically Signed   By: Ulyses Jarred M.D.   On: 09/20/2021 03:34    Procedures .Critical Care Performed by: Merrily Pew, MD Authorized by: Merrily Pew, MD   Critical care provider statement:    Critical care time (minutes):  65   Critical care was necessary to treat or prevent imminent or life-threatening deterioration of the following conditions:  Respiratory failure, CNS failure or compromise and endocrine crisis   Critical care was time spent personally by me on the following activities:  Discussions with consultants, evaluation of patient's response to treatment, examination of patient, ordering  and performing treatments and interventions, ordering and review of laboratory studies, ordering and review of radiographic studies, pulse oximetry, re-evaluation of patient's condition, obtaining history from patient or surrogate and review of old charts   Medications Ordered in ED Medications  fentaNYL (SUBLIMAZE) injection 100 mcg (has no administration in time range)  fentaNYL (SUBLIMAZE) injection 100 mcg (has no administration in time range)  propofol (DIPRIVAN) 1000 MG/100ML infusion (5 mcg/kg/min  100 kg Intravenous New Bag/Given 09/20/21 0604)  midazolam (VERSED) injection 2 mg (has no administration in time range)  midazolam (VERSED) injection 2 mg (has no administration in time range)  sodium chloride flush (NS) 0.9 % injection 3 mL (3 mLs Intravenous Given 09/20/21 0350)  ziprasidone (GEODON) injection 20 mg (20 mg Intramuscular Given 09/20/21 0330)  labetalol (NORMODYNE) injection 5 mg (5 mg Intravenous Given 09/20/21 0401)  labetalol (NORMODYNE) injection 10 mg (10 mg Intravenous Given 09/20/21 0529)  LORazepam (ATIVAN) injection 2 mg (2 mg  Intravenous Given 09/20/21 0456)  LORazepam (ATIVAN) injection 2 mg (2 mg Intravenous Given 09/20/21 0537)  etomidate (AMIDATE) injection 30 mg (30 mg Intravenous Given 09/20/21 0557)  rocuronium bromide 10 mg/mL (PF) syringe (100 mg Intravenous Given 09/20/21 0558)    ED Course  I have reviewed the triage vital signs and the nursing notes.  Pertinent labs & imaging results that were available during my care of the patient were reviewed by me and considered in my medical decision making (see chart for details).    MDM Rules/Calculators/A&P                         MS of unclear etiology. Reported R sided weakness and abrupt onset, will continue code stroke. Ct scan.   Patient without an obvious cause for encephalopathy besides her hypertension.  This was aggressively lowered using labetalol however patient remained persistently agitated requiring multiple doses of antipsychotic and benzodiazepines.  She needs an MRI but is not following commands and is not staying still continuously pulling leads off and blood pressure cuff and pulse ox.  Consulted critical care who agreed with likely need for intubation to perform the MRI and they suggested using Cleviprex if possible for blood pressure control however if she is on propofol sedation they said that Cardene may be a better choice.  This things will be initiated.  Attempted to update the sister but she did not answer the phone.  We will try again.  ICU aware, will admit after MRI. Dr. Laverta Baltimore aware.   Final Clinical Impression(s) / ED Diagnoses Final diagnoses:  None    Rx / DC Orders ED Discharge Orders     None        Mikiya Nebergall, Corene Cornea, MD 09/20/21 2308

## 2021-09-21 ENCOUNTER — Inpatient Hospital Stay (HOSPITAL_COMMUNITY): Payer: Medicare Other

## 2021-09-21 DIAGNOSIS — G9340 Encephalopathy, unspecified: Secondary | ICD-10-CM | POA: Diagnosis not present

## 2021-09-21 LAB — GLUCOSE, CAPILLARY
Glucose-Capillary: 145 mg/dL — ABNORMAL HIGH (ref 70–99)
Glucose-Capillary: 162 mg/dL — ABNORMAL HIGH (ref 70–99)
Glucose-Capillary: 166 mg/dL — ABNORMAL HIGH (ref 70–99)
Glucose-Capillary: 166 mg/dL — ABNORMAL HIGH (ref 70–99)
Glucose-Capillary: 168 mg/dL — ABNORMAL HIGH (ref 70–99)
Glucose-Capillary: 175 mg/dL — ABNORMAL HIGH (ref 70–99)
Glucose-Capillary: 175 mg/dL — ABNORMAL HIGH (ref 70–99)
Glucose-Capillary: 179 mg/dL — ABNORMAL HIGH (ref 70–99)
Glucose-Capillary: 184 mg/dL — ABNORMAL HIGH (ref 70–99)
Glucose-Capillary: 213 mg/dL — ABNORMAL HIGH (ref 70–99)
Glucose-Capillary: 243 mg/dL — ABNORMAL HIGH (ref 70–99)

## 2021-09-21 LAB — CBC
HCT: 39.2 % (ref 36.0–46.0)
Hemoglobin: 13.1 g/dL (ref 12.0–15.0)
MCH: 29.2 pg (ref 26.0–34.0)
MCHC: 33.4 g/dL (ref 30.0–36.0)
MCV: 87.5 fL (ref 80.0–100.0)
Platelets: 221 10*3/uL (ref 150–400)
RBC: 4.48 MIL/uL (ref 3.87–5.11)
RDW: 13.2 % (ref 11.5–15.5)
WBC: 23.2 10*3/uL — ABNORMAL HIGH (ref 4.0–10.5)
nRBC: 0 % (ref 0.0–0.2)

## 2021-09-21 LAB — BASIC METABOLIC PANEL
Anion gap: 14 (ref 5–15)
BUN: 35 mg/dL — ABNORMAL HIGH (ref 6–20)
CO2: 23 mmol/L (ref 22–32)
Calcium: 10 mg/dL (ref 8.9–10.3)
Chloride: 104 mmol/L (ref 98–111)
Creatinine, Ser: 2.5 mg/dL — ABNORMAL HIGH (ref 0.44–1.00)
GFR, Estimated: 22 mL/min — ABNORMAL LOW (ref >60)
Glucose, Bld: 170 mg/dL — ABNORMAL HIGH (ref 70–99)
Potassium: 3.7 mmol/L (ref 3.5–5.1)
Sodium: 141 mmol/L (ref 135–145)

## 2021-09-21 LAB — PHOSPHORUS
Phosphorus: 3.1 mg/dL (ref 2.5–4.6)
Phosphorus: 3 mg/dL (ref 2.5–4.6)
Phosphorus: 3.3 mg/dL (ref 2.5–4.6)

## 2021-09-21 LAB — MAGNESIUM
Magnesium: 1.8 mg/dL (ref 1.7–2.4)
Magnesium: 1.8 mg/dL (ref 1.7–2.4)
Magnesium: 1.8 mg/dL (ref 1.7–2.4)

## 2021-09-21 LAB — TRIGLYCERIDES: Triglycerides: 607 mg/dL — ABNORMAL HIGH (ref <150)

## 2021-09-21 MED ORDER — HYDRALAZINE HCL 25 MG PO TABS
25.0000 mg | ORAL_TABLET | Freq: Three times a day (TID) | ORAL | Status: DC | PRN
  Filled 2021-09-21: qty 1

## 2021-09-21 MED ORDER — INSULIN DETEMIR 100 UNIT/ML ~~LOC~~ SOLN
50.0000 [IU] | Freq: Two times a day (BID) | SUBCUTANEOUS | Status: DC
  Administered 2021-09-21 – 2021-09-22 (×4): 50 [IU] via SUBCUTANEOUS
  Filled 2021-09-21 (×6): qty 0.5

## 2021-09-21 MED ORDER — LACTATED RINGERS IV BOLUS
1000.0000 mL | Freq: Once | INTRAVENOUS | Status: AC
  Administered 2021-09-21: 1000 mL via INTRAVENOUS

## 2021-09-21 MED ORDER — PROSOURCE TF PO LIQD
45.0000 mL | Freq: Two times a day (BID) | ORAL | Status: DC
  Administered 2021-09-21: 45 mL
  Filled 2021-09-21: qty 45

## 2021-09-21 MED ORDER — CARVEDILOL 12.5 MG PO TABS
25.0000 mg | ORAL_TABLET | Freq: Two times a day (BID) | ORAL | Status: DC
  Administered 2021-09-21 – 2021-09-24 (×6): 25 mg
  Filled 2021-09-21 (×6): qty 2

## 2021-09-21 MED ORDER — GLUCERNA 1.5 CAL PO LIQD
1000.0000 mL | ORAL | Status: DC
  Administered 2021-09-21 – 2021-09-22 (×2): 1000 mL
  Filled 2021-09-21 (×5): qty 1000

## 2021-09-21 MED ORDER — FENTANYL 2500MCG IN NS 250ML (10MCG/ML) PREMIX INFUSION
0.0000 ug/h | INTRAVENOUS | Status: DC
  Administered 2021-09-21 – 2021-09-22 (×2): 50 ug/h via INTRAVENOUS
  Administered 2021-09-23: 200 ug/h via INTRAVENOUS
  Administered 2021-09-23: 175 ug/h via INTRAVENOUS
  Administered 2021-09-23: 200 ug/h via INTRAVENOUS
  Administered 2021-09-24: 250 ug/h via INTRAVENOUS
  Filled 2021-09-21 (×6): qty 250

## 2021-09-21 MED ORDER — SODIUM CHLORIDE 0.9 % IV SOLN
2.0000 g | INTRAVENOUS | Status: AC
  Administered 2021-09-21 – 2021-09-25 (×5): 2 g via INTRAVENOUS
  Filled 2021-09-21 (×5): qty 20

## 2021-09-21 MED ORDER — NICARDIPINE HCL IN NACL 20-0.86 MG/200ML-% IV SOLN
0.0000 mg/h | INTRAVENOUS | Status: DC
  Administered 2021-09-21: 5 mg/h via INTRAVENOUS
  Administered 2021-09-22: 3 mg/h via INTRAVENOUS
  Administered 2021-09-23 – 2021-09-25 (×7): 5 mg/h via INTRAVENOUS
  Administered 2021-09-25 (×3): 7.5 mg/h via INTRAVENOUS
  Filled 2021-09-21 (×3): qty 200
  Filled 2021-09-21: qty 400
  Filled 2021-09-21 (×8): qty 200

## 2021-09-21 MED ORDER — DEXMEDETOMIDINE HCL IN NACL 400 MCG/100ML IV SOLN
0.4000 ug/kg/h | INTRAVENOUS | Status: DC
Start: 2021-09-21 — End: 2021-09-24
  Administered 2021-09-21: 0.4 ug/kg/h via INTRAVENOUS
  Administered 2021-09-21 – 2021-09-22 (×4): 1.2 ug/kg/h via INTRAVENOUS
  Administered 2021-09-22: 1 ug/kg/h via INTRAVENOUS
  Administered 2021-09-22 (×4): 1.2 ug/kg/h via INTRAVENOUS
  Administered 2021-09-23: 1 ug/kg/h via INTRAVENOUS
  Administered 2021-09-23: 1.2 ug/kg/h via INTRAVENOUS
  Administered 2021-09-23: 0.8 ug/kg/h via INTRAVENOUS
  Administered 2021-09-23: 1.2 ug/kg/h via INTRAVENOUS
  Administered 2021-09-23: 0.8 ug/kg/h via INTRAVENOUS
  Administered 2021-09-24 (×2): 1 ug/kg/h via INTRAVENOUS
  Filled 2021-09-21 (×5): qty 100
  Filled 2021-09-21: qty 200
  Filled 2021-09-21 (×13): qty 100

## 2021-09-21 MED ORDER — VITAL HIGH PROTEIN PO LIQD
1000.0000 mL | ORAL | Status: DC
  Administered 2021-09-21: 1000 mL

## 2021-09-21 MED ORDER — HYDRALAZINE HCL 25 MG PO TABS
25.0000 mg | ORAL_TABLET | Freq: Three times a day (TID) | ORAL | Status: DC | PRN
  Administered 2021-09-21 – 2021-09-23 (×3): 25 mg
  Filled 2021-09-21: qty 1

## 2021-09-21 MED ORDER — VITAL HIGH PROTEIN PO LIQD
1000.0000 mL | ORAL | Status: AC
  Administered 2021-09-21: 1000 mL

## 2021-09-21 MED ORDER — FENTANYL BOLUS VIA INFUSION
100.0000 ug | INTRAVENOUS | Status: DC | PRN
Start: 2021-09-21 — End: 2021-09-24
  Administered 2021-09-21 – 2021-09-24 (×8): 100 ug via INTRAVENOUS
  Filled 2021-09-21: qty 100

## 2021-09-21 MED ORDER — INSULIN ASPART 100 UNIT/ML IJ SOLN
0.0000 [IU] | INTRAMUSCULAR | Status: DC
  Administered 2021-09-21: 3 [IU] via SUBCUTANEOUS
  Administered 2021-09-21 (×2): 5 [IU] via SUBCUTANEOUS
  Administered 2021-09-21: 3 [IU] via SUBCUTANEOUS
  Administered 2021-09-22: 6 [IU] via SUBCUTANEOUS
  Administered 2021-09-22: 3 [IU] via SUBCUTANEOUS
  Administered 2021-09-22: 8 [IU] via SUBCUTANEOUS
  Administered 2021-09-22 (×2): 3 [IU] via SUBCUTANEOUS
  Administered 2021-09-23 (×2): 5 [IU] via SUBCUTANEOUS
  Administered 2021-09-23 (×2): 3 [IU] via SUBCUTANEOUS
  Administered 2021-09-23: 5 [IU] via SUBCUTANEOUS
  Administered 2021-09-23 – 2021-09-24 (×2): 2 [IU] via SUBCUTANEOUS
  Administered 2021-09-25 (×2): 5 [IU] via SUBCUTANEOUS
  Administered 2021-09-25: 8 [IU] via SUBCUTANEOUS
  Administered 2021-09-25: 2 [IU] via SUBCUTANEOUS
  Administered 2021-09-25: 11 [IU] via SUBCUTANEOUS
  Administered 2021-09-26: 5 [IU] via SUBCUTANEOUS
  Administered 2021-09-26 (×2): 11 [IU] via SUBCUTANEOUS
  Administered 2021-09-26 (×2): 5 [IU] via SUBCUTANEOUS
  Administered 2021-09-26: 11 [IU] via SUBCUTANEOUS
  Administered 2021-09-27 (×2): 2 [IU] via SUBCUTANEOUS
  Administered 2021-09-27: 8 [IU] via SUBCUTANEOUS
  Administered 2021-09-27: 15 [IU] via SUBCUTANEOUS
  Administered 2021-09-28: 11 [IU] via SUBCUTANEOUS
  Administered 2021-09-28: 3 [IU] via SUBCUTANEOUS
  Administered 2021-09-28: 11 [IU] via SUBCUTANEOUS
  Administered 2021-09-28: 2 [IU] via SUBCUTANEOUS
  Administered 2021-09-28: 5 [IU] via SUBCUTANEOUS
  Administered 2021-09-28: 3 [IU] via SUBCUTANEOUS
  Administered 2021-09-29: 2 [IU] via SUBCUTANEOUS
  Administered 2021-09-29: 8 [IU] via SUBCUTANEOUS

## 2021-09-21 MED ORDER — ACETAMINOPHEN 325 MG PO TABS
650.0000 mg | ORAL_TABLET | ORAL | Status: DC | PRN
  Administered 2021-09-22 – 2021-09-23 (×2): 650 mg
  Filled 2021-09-21 (×2): qty 2

## 2021-09-21 MED ORDER — VITAL HIGH PROTEIN PO LIQD: 1000.0000 mL | ORAL | Status: DC

## 2021-09-21 NOTE — Progress Notes (Signed)
Maureen Jones, MRN:  [id number], DOB:  [birth year], LOS: 1 ADMISSION DATE:  09/20/2021, CONSULTATION DATE: 10/10 REFERRING MD:  Dr. Laverta [place], CHIEF COMPLAINT:  AMS   History of Present Illness:  55 y/o F who presented to [facility] on 10/10 with reports of acute altered mental status.   She was reportedly at home and went to smoke a cigarette 0200 with her nephew. She was apparently seen by her family at that time.  After smoking, the family heard a loud thud and she was found altered and confused.  On arrival to the ER she was noted to be agitated, confused, localized to pain and stated "that hurts" with stimulation.  She was seen initially as a CODE STROKE.  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 She was hypertensive with initial pressure of 240/112 with MAP of 136.  Initial labs showed glucose of 437, BUN 36, Sr CR 2.17, CK 316, ammonia 30, Mg 1.9, WBC 14.4. UDS was negative.  COVID & flu screening were negative.  CXR showed low lung volumes, ETT in good position, L>R interstitial opacities.     PCCM called for ICU admission.   Pertinent  Medical History  HTN DM   Significant Hospital Events: Including procedures, antibiotic start and stop dates in addition to other pertinent events   10/10 Admit with acute encephalopathy, hypertensive  Interim History / Subjective:  NAEON. Remains febrile. Did not get CTX ordered yesterday. Minimal vent settings. Off cardene.  Objective   Blood pressure (!) 143/69, pulse 75, temperature (!) 101.1 F (38.4 C), temperature source Axillary, resp. rate (!) 24, height '5\' 6"'$  (1.676 m), weight 100.2 kg, SpO2 94 %.    Vent Mode: PRVC FiO2 (%):  [40 %] 40 % Set Rate:  [24 bmp] 24 bmp Vt Set:  [470 mL] 470 mL PEEP:  [5 cmH20] 5 cmH20 Pressure Support:  [5 cmH20] 5 cmH20 Plateau Pressure:  [12 S192499 cmH20] 21 cmH20   Intake/Output Summary (Last 24 hours) at 09/21/2021 1202 Last data  filed at 09/21/2021 0700 Gross per 24 hour  Intake 1598.43 ml  Output 1060 ml  Net 538.43 ml   Filed Weights   09/20/21 0326 09/21/21 0500  Weight: 100 kg 100.2 kg    Examination: General: ill appearing adult female lying in bed on vent HENT: ETT, pupils 57m, anicteric Lungs: non-labored on vent, lungs bilaterally coarse with rhonchi Cardiovascular: S1S2 RRR, no m/r/g Abdomen: protuberant, soft, bsx4 active  Extremities: warm/dry, no edema  Neuro: sedate on propofol, moves all extremities spontaneously, does not follow commands  Resolved Hospital Problem list     Assessment & Plan:   Acute Hypertensive Emergency  Initial BP 240/112, MAP 136  -amlodipine 10 mg daily, coreg 25 mg BID --off cardene --TTE WNL --SBP < 10000000 Acute Metabolic Encephalopathy  Suspect in setting of hypertensive emergency, possible PRES. CT head negative.  -MRI negative -stop propofol (hyperTG) -Precedex, fentanyl gtt -hyperglycemia improved   Acute Hypoxic Respiratory Failure  Rule out pulmonary edema vs Aspiration  Tobacco Abuse -PRVC 8cc/kg -wean PEEP / fiO2 for sats >90% -empiric rocephin for aspiration/CAP -cessation counseling when able for tobacco abuse   AKI  10/2020 cr 1.7 -Trend BMP / urinary output -Replace electrolytes as indicated -Avoid nephrotoxic agents as able, ensure adequate renal perfusion - Start TF  DM with Hyperglycemia Not DKA, doubt HHS given glucose.  Suspect uncontrolled DM with stress response.  -hyperglycemia improved -  SQ insulin levemir 50 u BID (120 u at home) and SSI  Best Practice (right click and "Reselect all SmartList Selections" daily)  Diet/type: tubefeeds DVT prophylaxis: systemic heparin GI prophylaxis: PPI Lines: N/A Foley:  N/A Code Status:  full code Last date of multidisciplinary goals of care discussion: pending   Labs   CBC: Recent Labs  Lab 09/20/21 0323 09/20/21 0330 09/20/21 0715 09/21/21 0454  WBC 14.4*  --   --  23.2*   NEUTROABS 10.9*  --   --   --   HGB 13.3 13.9 13.9 13.1  HCT 41.5 41.0 41.0 39.2  MCV 90.2  --   --  87.5  PLT 227  --   --  221     Basic Metabolic Panel: Recent Labs  Lab 09/20/21 0323 09/20/21 0330 09/20/21 0644 09/20/21 0715 09/21/21 0454 09/21/21 1026  NA 136 136  --  135 141  --   K 4.4 4.6  --  3.8 3.7  --   CL 98 104  --   --  104  --   CO2 26  --   --   --  23  --   GLUCOSE 457* 458*  --   --  170*  --   BUN 36* 52*  --   --  35*  --   CREATININE 2.17* 2.20*  --   --  2.50*  --   CALCIUM 9.8  --   --   --  10.0  --   MG  --   --  1.9  --  1.8 1.8  PHOS  --   --   --   --  3.1 3.0    GFR: Estimated Creatinine Clearance: 30.7 mL/min (A) (by C-G formula based on SCr of 2.5 mg/dL (H)). Recent Labs  Lab 09/20/21 0323 09/21/21 0454  WBC 14.4* 23.2*     Liver Function Tests: Recent Labs  Lab 09/20/21 0323  AST 31  ALT 15  ALKPHOS 104  BILITOT 0.9  PROT 6.9  ALBUMIN 3.1*    No results for input(s): LIPASE, AMYLASE in the last 168 hours. Recent Labs  Lab 09/20/21 0644  AMMONIA 30     ABG    Component Value Date/Time   PHART 7.414 09/20/2021 0715   PCO2ART 40.7 09/20/2021 0715   PO2ART 230 (H) 09/20/2021 0715   HCO3 26.0 09/20/2021 0715   TCO2 27 09/20/2021 0715   O2SAT 100.0 09/20/2021 0715      Coagulation Profile: Recent Labs  Lab 09/20/21 0323  INR 0.9     Cardiac Enzymes: Recent Labs  Lab 09/20/21 0611  CKTOTAL 316*     HbA1C: Hgb A1c MFr Bld  Date/Time Value Ref Range Status  09/20/2021 12:02 PM 12.7 (H) 4.8 - 5.6 % Final    Comment:    (NOTE) Pre diabetes:          5.7%-6.4%  Diabetes:              >6.4%  Glycemic control for   <7.0% adults with diabetes     CBG: Recent Labs  Lab 09/21/21 0359 09/21/21 0551 09/21/21 0810 09/21/21 1009 09/21/21 1139  GLUCAP 175* 145* 166* 162* 166*     Review of Systems:   Unable to complete as patient is altered on mechanical ventilation.   Past Medical  History:  She,  has a past medical history of Diabetes ([place]) and Hypertension.   Surgical History:  History reviewed. No pertinent  surgical history.   Social History:   reports that she has been smoking cigarettes. She does not have any smokeless tobacco history on file.   Family History:  Her family history is not on file.   Allergies No Known Allergies   Home Medications  Prior to Admission medications   Not on File     Critical care time:     CRITICAL CARE Performed by: Lanier Clam   Total critical care time: 35 minutes  Critical care time was exclusive of separately billable procedures and treating other patients.  Critical care was necessary to treat or prevent imminent or life-threatening deterioration.  Critical care was time spent personally by me on the following activities: development of treatment plan with patient and/or surrogate as well as nursing, discussions with consultants, evaluation of patient's response to treatment, examination of patient, obtaining history from patient or surrogate, ordering and performing treatments and interventions, ordering and review of laboratory studies, ordering and review of radiographic studies, pulse oximetry and re-evaluation of patient's condition.   Lanier Clam, MD [facility] 09/21/2021, 12:02 PM   Please see [email or web address] for contact info   From 7A-7P if no response, please call [phone] After hours, please call ELink [phone]

## 2021-09-21 NOTE — Progress Notes (Signed)
[facility] Progress Note Patient Name: Maureen Jones DOB: 1966-08-19 MRN: [id number]   Date of Service  09/21/2021  HPI/Events of Note  Despite being on oral antihypertensives and PRN hydralazine; pt's BP still quite higher than goal  eICU Interventions  Restarted nicardipine gtt     Intervention Category Intermediate Interventions: Hypertension - evaluation and management  Tilden Dome 09/21/2021, 10:42 PM

## 2021-09-21 NOTE — Progress Notes (Signed)
Initial Nutrition Assessment  DOCUMENTATION CODES:   Obesity unspecified  INTERVENTION:   Initiate tube feeding via OG tube: Glucerna 1.5 at 50 ml/h (1200 ml per day)  Provides 1800 kcal, 99 gm protein, 912 ml free water daily Total carbohydrate: 157 grams  D/C Vital High Protein and ProSource    NUTRITION DIAGNOSIS:   Inadequate oral intake related to inability to eat as evidenced by NPO status.  GOAL:   Patient will meet greater than or equal to 90% of their needs  MONITOR:   TF tolerance, Diet advancement  REASON FOR ASSESSMENT:   Consult, Ventilator Enteral/tube feeding initiation and management  ASSESSMENT:   Pt with PMH of DM, HTN, and smoking admitted with acute encephalopathy.   Pt discussed during ICU rounds and with RN.  Per MD possible PRES. Pt weaned off of insulin drip. Started on levemir 50 u BID, is on 120 units per day at home, in additional novolog for meal coverage.   Patient is currently intubated on ventilator support MV: 11.3 L/min Temp (24hrs), Avg:100.8 F (38.2 C), Min:98.4 F (36.9 C), Max:103.1 F (39.5 C)   Medications reviewed and include: SSI, levemir 50 units BID, protonix  Precedex Fentanyl   Labs reviewed:  CBG's 162-168   NUTRITION - FOCUSED PHYSICAL EXAM:  Flowsheet Row Most Recent Value  Orbital Region No depletion  Upper Arm Region No depletion  Thoracic and Lumbar Region No depletion  Buccal Region No depletion  Temple Region No depletion  Clavicle Bone Region No depletion  Clavicle and Acromion Bone Region No depletion  Scapular Bone Region No depletion  Dorsal Hand No depletion  Patellar Region No depletion  Anterior Thigh Region No depletion  Posterior Calf Region No depletion  Edema (RD Assessment) None  Hair Reviewed  Eyes Unable to assess  Mouth Unable to assess  Skin Reviewed  Nails Reviewed       Diet Order:   Diet Order             Diet NPO time specified  Diet effective now                    EDUCATION NEEDS:   Not appropriate for education at this time  Skin:  Skin Assessment: Reviewed RN Assessment  Last BM:  10/11 small  Height:   Ht Readings from Last 1 Encounters:  09/20/21 '5\' 6"'$  (1.676 m)    Weight:   Wt Readings from Last 1 Encounters:  09/21/21 100.2 kg    BMI:  Body mass index is 35.65 kg/m.  Estimated Nutritional Needs:   Kcal:  1700-1900  Protein:  90-110 grams  Fluid:  > 1.7 L/day  Lockie Pares., RD, LDN, CNSC See AMiON for contact information

## 2021-09-21 NOTE — H&P (Signed)
NAMEOrfelinda Jones, MRN:  [id number], DOB:  01/17/1966, LOS: 1 ADMISSION DATE:  09/20/2021, CONSULTATION DATE: 10/10 REFERRING MD:  Dr. Laverta Baltimore, CHIEF COMPLAINT:  AMS   History of Present Illness:  55 y/o F who presented to [facility] on 10/10 with reports of acute altered mental status.   She was reportedly at home and went to smoke a cigarette 0200 with her nephew. She was apparently seen by her family at that time.  After smoking, the family heard a loud thud and she was found altered and confused.  On arrival to the ER she was noted to be agitated, confused, localized to pain and stated "that hurts" with stimulation.  She was seen initially as a CODE STROKE.  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 She was hypertensive with initial pressure of 240/112 with MAP of 136.  Initial labs showed glucose of 437, BUN 36, Sr CR 2.17, CK 316, ammonia 30, Mg 1.9, WBC 14.4. UDS was negative.  COVID & flu screening were negative.  CXR showed low lung volumes, ETT in good position, L>R interstitial opacities.     PCCM called for ICU admission.   Pertinent  Medical History  HTN DM   Significant Hospital Events: Including procedures, antibiotic start and stop dates in addition to other pertinent events   10/10 Admit with acute encephalopathy, hypertensive  Interim History / Subjective:  This AM more agitated and tachypneic when trying to wean sedation and given 100 of fentanyl. SBT for 1 hour and back on PRVC after desatting to 80s.   Objective   Blood pressure (!) 172/88, pulse 95, temperature (!) 101.1 F (38.4 C), temperature source Axillary, resp. rate (!) 21, height '5\' 6"'$  (1.676 m), weight 100.2 kg, SpO2 95 %.    Vent Mode: PSV;CPAP FiO2 (%):  [40 %] 40 % Set Rate:  [24 bmp] 24 bmp Vt Set:  [470 mL] 470 mL PEEP:  [5 cmH20] 5 cmH20 Pressure Support:  [5 cmH20] 5 cmH20 Plateau Pressure:  [12 cmH20-20 cmH20] 19 cmH20   Intake/Output  Summary (Last 24 hours) at 09/21/2021 0910 Last data filed at 09/21/2021 0700 Gross per 24 hour  Intake 1598.43 ml  Output 1060 ml  Net 538.43 ml   Filed Weights   09/20/21 0326 09/21/21 0500  Weight: 100 kg 100.2 kg    Examination: General: ill appearing adult female lying in bed on vent HENT: ETT, pupils 29m, anicteric Lungs: non-labored on vent, bilateral rhonchi on auscultation Cardiovascular: S1S2 RRR, no m/r/g Abdomen: protuberant, soft, normal active bowel sounds  Extremities: warm/dry, no edema  Neuro: sedate on fentanyl and propofol, does not follow commands or arouse to vocal or tactile stimuli  Resolved Hospital Problem list     Assessment & Plan:  Hypertensive Emergency  Blood pressure improving   Blood pressure has improved on Cardene gtt. Can discontinue Cardene gtt and start amlodipine and coreg.  -cardene gtt discontinued  -amlodipine and coreg  -avoid hypotension -given cardiomegaly on CXR, TTE done which showed normal EF (65-70%) and LVH  Acute Metabolic Encephalopathy  Suspect in setting of hypertensive emergency, possible PRES. CT head negative. MRI head negative, thus making PRES less likely. MRA head and neck negative as well. -hypertension improving and weaned off of cardene gtt -wean propofol for sedation as triglycerides in 600s, start presedex -overall, continue to wean sedation as tolerated for extubation -blood glucose trending down hyperglycemia  -frequent neuro exams  Leukocytosis  Persistent fever  Patient febrile yesterday, overnight, and this AM. Blood cx pending. Possible source could be pulmonary 2/2 CAP. UA negative.  - Start on empiric coverage with ceftriaxone  - f/u blood cx - tylenol PRN  - follow CXR   Acute Hypoxic Respiratory Failure  Pulmonary edema improved with diuresis  CAP Tobacco Abuse 10/11 CXR improved from 10/10, however possible persistent left hilar opacities consistent with CAP.  -PRVC 8cc/kg -wean PEEP /  fiO2 for sats >90% -follow CXR -s/p lasix -empiric rocephin for possible CAP  -cessation counseling when able for tobacco abuse   AKI  10/2020 cr 1.7 -s/p lasix 60 mg IV x1  -Trend BMP / urinary output -Replace electrolytes as indicated -Avoid nephrotoxic agents as able, ensure adequate renal perfusion  DM with Hyperglycemia (a1c 12.7) Unlikely DKA or HHS, most likely due to uncontrolled DM with stress response as A1c is 12.7. BG has improved with insulin gtt and can likely transition to subQ insulin this afternoon.  -insulin gtt and can transition to subQ insulin this afternoon  Best Practice (right click and "Reselect all SmartList Selections" daily)  Diet/type: tubefeeds DVT prophylaxis: systemic heparin GI prophylaxis: PPI Lines: N/A Foley:  N/A Code Status:  full code Last date of multidisciplinary goals of care discussion: pending   Labs   CBC: Recent Labs  Lab 09/20/21 0323 09/20/21 0330 09/20/21 0715 09/21/21 0454  WBC 14.4*  --   --  23.2*  NEUTROABS 10.9*  --   --   --   HGB 13.3 13.9 13.9 13.1  HCT 41.5 41.0 41.0 39.2  MCV 90.2  --   --  87.5  PLT 227  --   --  221     Basic Metabolic Panel: Recent Labs  Lab 09/20/21 0323 09/20/21 0330 09/20/21 0644 09/20/21 0715 09/21/21 0454  NA 136 136  --  135 141  K 4.4 4.6  --  3.8 3.7  CL 98 104  --   --  104  CO2 26  --   --   --  23  GLUCOSE 457* 458*  --   --  170*  BUN 36* 52*  --   --  35*  CREATININE 2.17* 2.20*  --   --  2.50*  CALCIUM 9.8  --   --   --  10.0  MG  --   --  1.9  --  1.8  PHOS  --   --   --   --  3.1    GFR: Estimated Creatinine Clearance: 30.7 mL/min (A) (by C-G formula based on SCr of 2.5 mg/dL (H)). Recent Labs  Lab 09/20/21 0323 09/21/21 0454  WBC 14.4* 23.2*     Liver Function Tests: Recent Labs  Lab 09/20/21 0323  AST 31  ALT 15  ALKPHOS 104  BILITOT 0.9  PROT 6.9  ALBUMIN 3.1*    No results for input(s): LIPASE, AMYLASE in the last 168 hours. Recent  Labs  Lab 09/20/21 0644  AMMONIA 30     ABG    Component Value Date/Time   PHART 7.414 09/20/2021 0715   PCO2ART 40.7 09/20/2021 0715   PO2ART 230 (H) 09/20/2021 0715   HCO3 26.0 09/20/2021 0715   TCO2 27 09/20/2021 0715   O2SAT 100.0 09/20/2021 0715      Coagulation Profile: Recent Labs  Lab 09/20/21 0323  INR 0.9     Cardiac Enzymes: Recent Labs  Lab 09/20/21 0611  CKTOTAL 316*  HbA1C: Hgb A1c MFr Bld  Date/Time Value Ref Range Status  09/20/2021 12:02 PM 12.7 (H) 4.8 - 5.6 % Final    Comment:    (NOTE) Pre diabetes:          5.7%-6.4%  Diabetes:              >6.4%  Glycemic control for   <7.0% adults with diabetes     CBG: Recent Labs  Lab 09/21/21 0142 09/21/21 0255 09/21/21 0359 09/21/21 0551 09/21/21 0810  GLUCAP 179* 175* 175* 145* 166*     Review of Systems:   Unable to complete as patient is altered on mechanical ventilation.   Past Medical History:  She,  has a past medical history of Diabetes ([place]) and Hypertension.   Surgical History:  History reviewed. No pertinent surgical history.   Social History:   reports that she has been smoking cigarettes. She does not have any smokeless tobacco history on file.   Family History:  Her family history is not on file.   Allergies No Known Allergies   Home Medications  Prior to Admission medications   Not on File     Critical care time: 30 minutes     Jacqlyn Larsen, MS4

## 2021-09-21 NOTE — Progress Notes (Signed)
Alerted Elink MD that patient's systolic [id number] despite prn hydralazine being given.  Was given orders to start Cardene.

## 2021-09-21 NOTE — Progress Notes (Signed)
Pt transported to and from MRI w/o event.

## 2021-09-21 NOTE — Progress Notes (Signed)
Stroke Neurology Progress Note  IDENTIFYING INFORMATION  Maureen Jones MR# [id number] 09/21/2021  HISTORY OF PRESENT ILLNESS  Maureen Jones is a 55 y.o. female who  has a past medical history of Diabetes ([place]) and Hypertension. Presenting as a Code Stroke with acute onset of agitation with bizarre behavior 1. Exam reveals findings most consistent with an agitated delirium. DDx includes illicit substance intoxication and psychotic break. Low on the DDx but also possible would be an atypical presentation of seizure. No focal weakness noted. Also with no tremor, jerking, posturing or twitching.   2. CT head: No acute intracranial abnormality. 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 ASPECTS is 10.  3. Overall presentation not consistent with stroke. No ataxia, ocular motility defect or pupillary abnormalities to suggest an unusual presentation of top of basilar syndrome or an artery of [place] infarction. Risks of IV thrombolysis significantly outweigh potential benefits.   INTERVAL HISTORY  Yesterday (09/20/2021) patient noted to have downward gaze. MRI brain with no acute changes noted. Angiography did not show vascular abnormality as etiology for up gaze palsy. Thought more likely metabolic. Today, eyes are in primary gaze.   MEDICATIONS    Current Facility-Administered Medications (Endocrine & Metabolic):    insulin regular, human (MYXREDLIN) 100 units/ 100 mL infusion   Current Facility-Administered Medications (Cardiovascular):    amLODipine (NORVASC) tablet 10 mg   carvedilol (COREG) tablet 25 mg     Current Facility-Administered Medications (Analgesics):    acetaminophen (TYLENOL) tablet 650 mg   fentaNYL (SUBLIMAZE) bolus via infusion 100 mcg   fentaNYL (SUBLIMAZE) injection 100 mcg   fentaNYL (SUBLIMAZE) injection 50 mcg   fentaNYL 2542mg in NS 2535m(1038mml) infusion-PREMIX   propofol (DIPRIVAN) 1000 MG/100ML infusion   Current  Facility-Administered Medications (Hematological):    heparin injection 5,000 Units   Current Facility-Administered Medications (Other):    cefTRIAXone (ROCEPHIN) 2 g in sodium chloride 0.9 % 100 mL IVPB   chlorhexidine gluconate (MEDLINE KIT) (PERIDEX) 0.12 % solution 15 mL   Chlorhexidine Gluconate Cloth 2 % PADS 6 each   dexmedetomidine (PRECEDEX) 400 MCG/100ML (4 mcg/mL) infusion   dextrose 50 % solution 0-50 mL   docusate (COLACE) 50 MG/5ML liquid 100 mg   MEDLINE mouth rinse   midazolam (VERSED) injection 2 mg   ondansetron (ZOFRAN) injection 4 mg   pantoprazole sodium (PROTONIX) 40 mg/20 mL oral suspension 40 mg   polyethylene glycol (MIRALAX / GLYCOLAX) packet 17 g  No current outpatient medications on file.  VITAL SIGNS  Temp:  [100.9 F (38.3 C)-103.1 F (39.5 C)] 101.1 F (38.4 C) (10/11 0800) Pulse Rate:  [91-116] 95 (10/11 0700) Resp:  [18-28] 21 (10/11 0700) BP: (93-172)/(60-99) 130/66 (10/11 0921) SpO2:  [93 %-100 %] 95 % (10/11 0757) FiO2 (%):  [40 %] 40 % (10/11 0757) Weight:  [100.2 kg] 100.2 kg (10/11 0500)  PHYSICAL EXAM   General Physical Exam  General: NAD, intubated and heavily sedated lying comfortably in bed. HENT: ET tube in place. Normal external appearance of ears and nose. CV/Chest: No JVD, normal S1S2 Lungs: No audible wheezing/cuff leak. No accessory muscle use Abdomen: Non distended, non tender Extremities: Warm and well perfused. No appreciable edema, cyanosis or deformity. Skin: No rash. Normal palpation of skin.   Musculoskeletal: No joint tenderness. Normal digits and nails by inspection. No clubbing.  Neurologic Examination   The patient is intubated and heavily sedated fentanyl and propofol. Eyes in primary gaze. Doll's (-)  Pupils equal, round, ~3mm72m> ~  78m to light. No purposeful movements or withdrawal to pain.   IMAGING/DIAGNOSTIC STUDIES   MRI brain 09/20/2021: No acute infarction, hemorrhage, hydrocephalus, extra-axial  collection or mass lesion. Mild T2 hyperintense signal in the periventricular white matter, likely the sequela of chronic small vessel ischemic disease. 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 Mildly prominent ventricles for age, which may reflect central atrophy.  MRA head and neck 09/20/2021: Normal   EEG: This technically difficult study is suggestive of severe to profound diffuse encephalopathy, nonspecific etiology. No seizures or epileptiform discharges were seen throughout the recording.  Lab Results  Component Value Date   HGBA1C 12.7 (H) 09/20/2021   Glucose 458 (09/20/2021)--->170 (09/21/2021)  ASSESSMENT AND PLAN  Maureen Jones is a 55y.o. female who presents with acute hypoxic respiratory failure, HTN emergency and toxic Metabolic encephalopathy likely multifactorial. Today the patient is normoglycemic and  there is no longer up gaze palsy and eyes are in primary gaze.  Continue correcting metabolic derangements. Neurology will continue to follow.  Electronically signed by:  HLynnae Sandhoff MD Page: [phone]/10/2021, 9:23 AM

## 2021-09-21 NOTE — Progress Notes (Signed)
Inpatient Diabetes Program Recommendations  AACE/ADA: New Consensus Statement on Inpatient Glycemic Control (2015)  Target Ranges:  Prepandial:   less than 140 mg/dL      Peak postprandial:   less than 180 mg/dL (1-2 hours)      Critically ill patients:  140 - 180 mg/dL   Lab Results  Component Value Date   GLUCAP 166 (H) 09/21/2021   HGBA1C 12.7 (H) 09/20/2021    Review of Glycemic Control Results for Maureen Jones, Maureen Jones (MRN [id number]) as of 09/21/2021 09:29  Ref. Range 09/21/2021 02:55 09/21/2021 03:59 09/21/2021 05:51 09/21/2021 08:10  Glucose-Capillary Latest Ref Range: 70 - 99 mg/dL 175 (H) 175 (H) 145 (H) 166 (H)    Diabetes history: DM2 Outpatient Diabetes medications: Basaglar 60 units am, 65 units pm, Novolog 15 units tid meal coverage, 5 units ac smoothies, Metformin 850 mg bid Current orders for Inpatient glycemic control: IV insulin   Inpatient Diabetes Program Recommendations:    When ready to transition consider: -Semglee 60 units 2 hours prior to discontinuation of IV insulin, then QD to follow -Novolog 2-6 units Q4H  Thanks, Bronson Curb, MSN, RNC-OB Diabetes Coordinator [phone] (8a-5p)

## 2021-09-22 ENCOUNTER — Inpatient Hospital Stay (HOSPITAL_COMMUNITY): Payer: Medicare Other

## 2021-09-22 DIAGNOSIS — Z978 Presence of other specified devices: Secondary | ICD-10-CM | POA: Diagnosis not present

## 2021-09-22 DIAGNOSIS — G9340 Encephalopathy, unspecified: Secondary | ICD-10-CM | POA: Diagnosis not present

## 2021-09-22 LAB — CBC
HCT: 40.1 % (ref 36.0–46.0)
Hemoglobin: 13 g/dL (ref 12.0–15.0)
MCH: 29.1 pg (ref 26.0–34.0)
MCHC: 32.4 g/dL (ref 30.0–36.0)
MCV: 89.7 fL (ref 80.0–100.0)
Platelets: 164 10*3/uL (ref 150–400)
RBC: 4.47 MIL/uL (ref 3.87–5.11)
RDW: 13.2 % (ref 11.5–15.5)
WBC: 18 10*3/uL — ABNORMAL HIGH (ref 4.0–10.5)
nRBC: 0 % (ref 0.0–0.2)

## 2021-09-22 LAB — POCT I-STAT 7, (LYTES, BLD GAS, ICA,H+H)
Acid-Base Excess: 2 mmol/L (ref 0.0–2.0)
Bicarbonate: 25.7 mmol/L (ref 20.0–28.0)
Calcium, Ion: 1.29 mmol/L (ref 1.15–1.40)
HCT: 33 % — ABNORMAL LOW (ref 36.0–46.0)
Hemoglobin: 11.2 g/dL — ABNORMAL LOW (ref 12.0–15.0)
O2 Saturation: 100 %
Potassium: 3.5 mmol/L (ref 3.5–5.1)
Sodium: 142 mmol/L (ref 135–145)
TCO2: 27 mmol/L (ref 22–32)
pCO2 arterial: 36.5 mmHg (ref 32.0–48.0)
pH, Arterial: 7.455 — ABNORMAL HIGH (ref 7.350–7.450)
pO2, Arterial: 254 mmHg — ABNORMAL HIGH (ref 83.0–108.0)

## 2021-09-22 LAB — MAGNESIUM
Magnesium: 1.9 mg/dL (ref 1.7–2.4)
Magnesium: 2 mg/dL (ref 1.7–2.4)

## 2021-09-22 LAB — BASIC METABOLIC PANEL
Anion gap: 10 (ref 5–15)
BUN: 36 mg/dL — ABNORMAL HIGH (ref 6–20)
CO2: 24 mmol/L (ref 22–32)
Calcium: 9.7 mg/dL (ref 8.9–10.3)
Chloride: 108 mmol/L (ref 98–111)
Creatinine, Ser: 2.18 mg/dL — ABNORMAL HIGH (ref 0.44–1.00)
GFR, Estimated: 26 mL/min — ABNORMAL LOW (ref >60)
Glucose, Bld: 184 mg/dL — ABNORMAL HIGH (ref 70–99)
Potassium: 3.1 mmol/L — ABNORMAL LOW (ref 3.5–5.1)
Sodium: 142 mmol/L (ref 135–145)

## 2021-09-22 LAB — GLUCOSE, CAPILLARY
Glucose-Capillary: 174 mg/dL — ABNORMAL HIGH (ref 70–99)
Glucose-Capillary: 198 mg/dL — ABNORMAL HIGH (ref 70–99)
Glucose-Capillary: 172 mg/dL — ABNORMAL HIGH (ref 70–99)
Glucose-Capillary: 183 mg/dL — ABNORMAL HIGH (ref 70–99)
Glucose-Capillary: 245 mg/dL — ABNORMAL HIGH (ref 70–99)
Glucose-Capillary: 261 mg/dL — ABNORMAL HIGH (ref 70–99)

## 2021-09-22 LAB — PHOSPHORUS
Phosphorus: 3.4 mg/dL (ref 2.5–4.6)
Phosphorus: 3.3 mg/dL (ref 2.5–4.6)

## 2021-09-22 MED ORDER — ETOMIDATE 2 MG/ML IV SOLN
INTRAVENOUS | Status: AC
  Administered 2021-09-22: 20 mg via INTRAVENOUS
  Filled 2021-09-22: qty 20

## 2021-09-22 MED ORDER — HALOPERIDOL LACTATE 5 MG/ML IJ SOLN
5.0000 mg | Freq: Once | INTRAMUSCULAR | Status: AC
  Administered 2021-09-22: 5 mg via INTRAVENOUS
  Filled 2021-09-22: qty 1

## 2021-09-22 MED ORDER — DEXAMETHASONE SODIUM PHOSPHATE 10 MG/ML IJ SOLN
6.0000 mg | Freq: Once | INTRAMUSCULAR | Status: AC
  Administered 2021-09-22: 6 mg via INTRAVENOUS
  Filled 2021-09-22: qty 1

## 2021-09-22 MED ORDER — ETOMIDATE 2 MG/ML IV SOLN: 20.0000 mg | Freq: Once | INTRAVENOUS | Status: AC

## 2021-09-22 MED ORDER — HYDRALAZINE HCL 50 MG PO TABS
50.0000 mg | ORAL_TABLET | Freq: Three times a day (TID) | ORAL | Status: DC
  Administered 2021-09-22 – 2021-09-23 (×2): 50 mg
  Filled 2021-09-22 (×3): qty 1

## 2021-09-22 MED ORDER — FENTANYL CITRATE PF 50 MCG/ML IJ SOSY
100.0000 ug | PREFILLED_SYRINGE | Freq: Once | INTRAMUSCULAR | Status: AC
  Administered 2021-09-22: 100 ug via INTRAVENOUS

## 2021-09-22 MED ORDER — MORPHINE SULFATE (PF) 2 MG/ML IV SOLN
2.0000 mg | Freq: Once | INTRAVENOUS | Status: AC
  Administered 2021-09-22: 2 mg via INTRAVENOUS
  Filled 2021-09-22: qty 1

## 2021-09-22 MED ORDER — ROCURONIUM BROMIDE 10 MG/ML (PF) SYRINGE
PREFILLED_SYRINGE | INTRAVENOUS | Status: AC
  Administered 2021-09-22: 100 mg via INTRAVENOUS
  Filled 2021-09-22: qty 10

## 2021-09-22 MED ORDER — POTASSIUM CHLORIDE 10 MEQ/100ML IV SOLN
10.0000 meq | INTRAVENOUS | Status: AC
  Administered 2021-09-22 (×4): 10 meq via INTRAVENOUS
  Filled 2021-09-22 (×4): qty 100

## 2021-09-22 MED ORDER — ROCURONIUM BROMIDE 50 MG/5ML IV SOLN
100.0000 mg | Freq: Once | INTRAVENOUS | Status: AC
  Filled 2021-09-22: qty 10

## 2021-09-22 NOTE — Procedures (Signed)
Intubation Procedure Note  Jaimelynn Dimmick  [id number]  [birth year]  Date:09/22/21  Time:11:14 AM   Provider Performing:Celvin Taney D Rollene Rotunda    Procedure: Intubation ([id number])  Indication(s) Respiratory Failure  Consent Unable to obtain consent due to emergent nature of procedure.   Anesthesia Etomidate, Fentanyl, and Rocuronium   Time Out Verified patient identification, verified procedure, site/side was marked, verified correct patient position, special equipment/implants available, medications/allergies/relevant history reviewed, required imaging and test results available.   Sterile Technique Usual hand hygeine, masks, and gloves were used   Procedure Description Patient positioned in bed supine.  Sedation given as noted above.  Patient was intubated with endotracheal tube using Glidescope.  View was Grade 1 full glottis .  Number of attempts was 1.  Colorimetric CO2 detector was consistent with tracheal placement.   Complications/Tolerance None; patient tolerated the procedure well. Chest X-ray is ordered to verify placement.   EBL Minimal   Specimen(s) None  JD Rexene Agent [facility] 09/22/2021, 11:15 AM  Please see [email or web address] for pager details.  From 7A-7P if no response, please call [phone]. After hours, please call ELink [phone].

## 2021-09-22 NOTE — Progress Notes (Signed)
NAMEOrvella Jones, MRN:  [id number], DOB:  [birth year], LOS: 2 ADMISSION DATE:  09/20/2021, CONSULTATION DATE: 10/10 REFERRING MD:  Dr. Laverta Baltimore, CHIEF COMPLAINT:  AMS   History of Present Illness:  55 y/o F who presented to [facility] on 10/10 with reports of acute altered mental status.   She was reportedly at home and went to smoke a cigarette 0200 with her nephew. She was apparently seen by her family at that time.  After smoking, the family heard a loud thud and she was found altered and confused.  On arrival to the ER she was noted to be agitated, confused, localized to pain and stated "that hurts" with stimulation.  She was seen initially as a CODE STROKE.  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 She was hypertensive with initial pressure of 240/112 with MAP of 136.  Initial labs showed glucose of 437, BUN 36, Sr CR 2.17, CK 316, ammonia 30, Mg 1.9, WBC 14.4. UDS was negative.  COVID & flu screening were negative.  CXR showed low lung volumes, ETT in good position, L>R interstitial opacities.     PCCM called for ICU admission.   Pertinent  Medical History  HTN DM   Significant Hospital Events: Including procedures, antibiotic start and stop dates in addition to other pertinent events   10/10 Admit with acute encephalopathy, hypertensive  Interim History / Subjective:  Passed SBT, intermittent followed commands. Extubated. Developed tachypnea and increased WOB, stridulous sounds. Re-intubated. Minimal edema on fiberoptic view. Suspect not mobilizing secretions.   Objective   Blood pressure (!) 167/72, pulse 76, temperature (!) 100.6 F (38.1 C), temperature source Axillary, resp. rate (!) 24, height '5\' 6"'$  (1.676 m), weight 101.9 kg, SpO2 99 %.    Vent Mode: PRVC FiO2 (%):  [40 %-100 %] 100 % Set Rate:  [24 bmp] 24 bmp Vt Set:  [470 mL] 470 mL PEEP:  [5 cmH20-10 cmH20] 10 cmH20 Pressure Support:  [5 cmH20] 5  cmH20 Plateau Pressure:  [20 cmH20-25 cmH20] 25 cmH20   Intake/Output Summary (Last 24 hours) at 09/22/2021 1315 Last data filed at 09/22/2021 1200 Gross per 24 hour  Intake 3352.95 ml  Output 1132 ml  Net 2220.95 ml    Filed Weights   09/20/21 0326 09/21/21 0500 09/22/21 0500  Weight: 100 kg 100.2 kg 101.9 kg    Examination: General: ill appearing adult female lying in bed on vent HENT: ETT, pupils 53m, anicteric Lungs: non-labored on vent, lungs bilaterally coarse with rhonchi Cardiovascular: S1S2 RRR, no m/r/g Abdomen: protuberant, soft, bsx4 active  Extremities: warm/dry, no edema  Neuro: sedate on propofol, moves all extremities spontaneously, does not follow commands  Resolved Hospital Problem list     Assessment & Plan:   Acute Hypertensive Emergency  Initial BP 240/112, MAP 136  -amlodipine 10 mg daily, coreg 25 mg BID, hydralazine 50 mg TID --wean cardene --TTE WNL --SBP < 10000000 Acute Metabolic Encephalopathy  Suspect in setting of hypertensive emergency, possible PRES. CT head negative.  -MRI negative -Precedex, fentanyl gtt -hyperglycemia improved   Acute Hypoxic Respiratory Failure  Pulmonary edema vs Aspiration  Tobacco Abuse -PRVC 8cc/kg -wean PEEP / fiO2 for sats >90% -empiric rocephin for aspiration/CAP -cessation counseling when able for tobacco abuse  -Reintubated 10/12 with increased WOB, not mobilizing secretions, stridor, dexamethasone given  AKI  10/2020 cr 1.7. Cr improved after gentle hydration 10/11. -Trend BMP / urinary output -Replace electrolytes as  indicated -Avoid nephrotoxic agents as able, ensure adequate renal perfusion  DM with Hyperglycemia Not DKA, doubt HHS given glucose.  Suspect uncontrolled DM with stress response.  -hyperglycemia improved -SQ insulin levemir 50 u BID (120 u at home) and SSI  Best Practice (right click and "Reselect all SmartList Selections" daily)  Diet/type: tubefeeds DVT prophylaxis: systemic  heparin GI prophylaxis: PPI Lines: N/A Foley:  N/A Code Status:  full code Last date of multidisciplinary goals of care discussion: pending   Labs   CBC: Recent Labs  Lab 09/20/21 0323 09/20/21 0330 09/20/21 0715 09/21/21 0454 09/22/21 0448 09/22/21 1226  WBC 14.4*  --   --  23.2* 18.0*  --   NEUTROABS 10.9*  --   --   --   --   --   HGB 13.3 13.9 13.9 13.1 13.0 11.2*  HCT 41.5 41.0 41.0 39.2 40.1 33.0*  MCV 90.2  --   --  87.5 89.7  --   PLT 227  --   --  221 164  --      Basic Metabolic Panel: Recent Labs  Lab 09/20/21 0323 09/20/21 0330 09/20/21 0644 09/20/21 0715 09/21/21 0454 09/21/21 1026 09/21/21 1622 09/22/21 0448 09/22/21 1226  NA 136 136  --  135 141  --   --  142 142  K 4.4 4.6  --  3.8 3.7  --   --  3.1* 3.5  CL 98 104  --   --  104  --   --  108  --   CO2 26  --   --   --  23  --   --  24  --   GLUCOSE 457* 458*  --   --  170*  --   --  184*  --   BUN 36* 52*  --   --  35*  --   --  36*  --   CREATININE 2.17* 2.20*  --   --  2.50*  --   --  2.18*  --   CALCIUM 9.8  --   --   --  10.0  --   --  9.7  --   MG  --   --  1.9  --  1.8 1.8 1.8 1.9  --   PHOS  --   --   --   --  3.1 3.0 3.3 3.4  --     GFR: Estimated Creatinine Clearance: 35.5 mL/min (A) (by C-G formula based on SCr of 2.18 mg/dL (H)). Recent Labs  Lab 09/20/21 0323 09/21/21 0454 09/22/21 0448  WBC 14.4* 23.2* 18.0*     Liver Function Tests: Recent Labs  Lab 09/20/21 0323  AST 31  ALT 15  ALKPHOS 104  BILITOT 0.9  PROT 6.9  ALBUMIN 3.1*    No results for input(s): LIPASE, AMYLASE in the last 168 hours. Recent Labs  Lab 09/20/21 0644  AMMONIA 30     ABG    Component Value Date/Time   PHART 7.455 (H) 09/22/2021 1226   PCO2ART 36.5 09/22/2021 1226   PO2ART 254 (H) 09/22/2021 1226   HCO3 25.7 09/22/2021 1226   TCO2 27 09/22/2021 1226   O2SAT 100.0 09/22/2021 1226      Coagulation Profile: Recent Labs  Lab 09/20/21 0323  INR 0.9     Cardiac  Enzymes: Recent Labs  Lab 09/20/21 0611  CKTOTAL 316*     HbA1C: Hgb A1c MFr Bld  Date/Time Value Ref Range Status  09/20/2021 12:02 PM 12.7 (H) 4.8 - 5.6 % Final    Comment:    (NOTE) Pre diabetes:          5.7%-6.4%  Diabetes:              >6.4%  Glycemic control for   <7.0% adults with diabetes     CBG: Recent Labs  Lab 09/21/21 1934 09/21/21 2327 09/22/21 0339 09/22/21 0743 09/22/21 1132  GLUCAP 243* 213* 174* 198* 172*     Review of Systems:   Unable to complete as patient is altered on mechanical ventilation.   Past Medical History:  She,  has a past medical history of Diabetes ([place]) and Hypertension.   Surgical History:  History reviewed. No pertinent surgical history.   Social History:   reports that she has been smoking cigarettes. She does not have any smokeless tobacco history on file.   Family History:  Her family history is not on file.   Allergies No Known Allergies   Home Medications  Prior to Admission medications   Not on File     Critical care time:     CRITICAL CARE Performed by: Lanier Clam   Total critical care time: 40 minutes  Critical care time was exclusive of separately billable procedures and treating other patients.  Critical care was necessary to treat or prevent imminent or life-threatening deterioration.  Critical care was time spent personally by me on the following activities: development of treatment plan with patient and/or surrogate as well as nursing, discussions with consultants, evaluation of patient's response to treatment, examination of patient, obtaining history from patient or surrogate, ordering and performing treatments and interventions, ordering and review of laboratory studies, ordering and review of radiographic studies, pulse oximetry and re-evaluation of patient's condition.   Lanier Clam, MD [facility] 09/22/2021, 1:15 PM   Please see [email or web address] for  contact info   From 7A-7P if no response, please call [phone] After hours, please call ELink [phone]

## 2021-09-22 NOTE — Progress Notes (Signed)
NAMEBrandilee Jones, MRN:  [id number], DOB:  1966-07-03, LOS: 2 ADMISSION DATE:  09/20/2021, CONSULTATION DATE: 10/10 REFERRING MD:  Dr. Laverta Baltimore, CHIEF COMPLAINT:  AMS   History of Present Illness:  55 y/o F who presented to [facility] on 10/10 with reports of acute altered mental status.   She was reportedly at home and went to smoke a cigarette 0200 with her nephew. She was apparently seen by her family at that time.  After smoking, the family heard a loud thud and she was found altered and confused.  On arrival to the ER she was noted to be agitated, confused, localized to pain and stated "that hurts" with stimulation.  She was seen initially as a CODE STROKE.  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 She was hypertensive with initial pressure of 240/112 with MAP of 136.  Initial labs showed glucose of 437, BUN 36, Sr CR 2.17, CK 316, ammonia 30, Mg 1.9, WBC 14.4. UDS was negative.  COVID & flu screening were negative.  CXR showed low lung volumes, ETT in good position, L>R interstitial opacities.     PCCM called for ICU admission.   Pertinent  Medical History  HTN DM   Significant Hospital Events: Including procedures, antibiotic start and stop dates in addition to other pertinent events   10/10 Admit with acute encephalopathy, hypertensive 10/11 off cardene gtt, hyperglycemia improved and off insulin gtt  10/11 ON restarted cardene gtt d/t hypertensive episodes  10/12 AM normotensive and plan to transition off cardene gtt, start hydralazine  Interim History / Subjective:  ON hypertensive on oral BP meds and PRN hydralazine, so Cardene drip restarted. Normotensive this AM.  Objective   Blood pressure 137/73, pulse 72, temperature 98.3 F (36.8 C), temperature source Axillary, resp. rate (!) 24, height '5\' 6"'$  (1.676 m), weight 101.9 kg, SpO2 96 %.    Vent Mode: PRVC FiO2 (%):  [40 %] 40 % Set Rate:  [24 bmp] 24 bmp Vt Set:   [470 mL] 470 mL PEEP:  [5 cmH20] 5 cmH20 Pressure Support:  [5 cmH20] 5 cmH20 Plateau Pressure:  [20 cmH20-24 cmH20] 20 cmH20   Intake/Output Summary (Last 24 hours) at 09/22/2021 [id number] Last data filed at 09/22/2021 0600 Gross per 24 hour  Intake 2781.76 ml  Output 1022 ml  Net 1759.76 ml    Filed Weights   09/20/21 0326 09/21/21 0500 09/22/21 0500  Weight: 100 kg 100.2 kg 101.9 kg    Examination: General: ill appearing adult female lying in bed on vent, somnolent HENT: ETT, pupils 55m, anicteric Lungs: non-labored on vent, lungs clear on auscultation anteriorly, difficult to appreciate lung sounds d/t vent Cardiovascular: S1S2 RRR, no m/r/g Abdomen: protuberant, soft, bsx4 active  Extremities: warm/dry, no edema  Neuro: sedated on presedex and fentanyl, moves all extremities spontaneously, does not follow commands  Resolved Hospital Problem list     Assessment & Plan:   Acute Hypertensive Emergency  Long standing hypertension Initial BP 240/112, MAP 136. Cardene gtt restarted ON d/t persistent hypertension, which could have been precipitated by transitioning propofol to presedex. Additionally, pt has long standing, uncontrolled HTN which is likely contributing to persistent hypertension.  -discontinue Cardene gtt -start hydralazine 50 mg q8h for BP control -once renal function improves, transition hydralazine to ACE or ARB -continue amlodipine 10 mg daily, coreg 25 mg BID -TTE WNL -goal SBP < 10000000 Acute Metabolic Encephalopathy  Suspect in setting of  hypertensive emergency. CT head negative.  -MRI negative, making PRES less likely -stopped propofol (hyperTG) -Precedex, fentanyl gtt -hyperglycemia improved  -continue to wean sedation as tolerated  Acute Hypoxic Respiratory Failure  Rule out pulmonary edema vs Aspiration  Tobacco Abuse Patient now afebrile and CBC pending.  -PRVC 8cc/kg -wean PEEP / fiO2 for sats >90% -day 2 of empiric rocephin for  aspiration/CAP, continue for 3 more days -cessation counseling when able for tobacco abuse   AKI  10/2020 cr 1.7 On 10/11, low UO throughout the day that improved with IV hydration -Trend BMP / urinary output -Replace electrolytes as indicated -Avoid nephrotoxic agents as able, ensure adequate renal perfusion -Continue TF   DM with Hyperglycemia Not DKA, doubt HHS given glucose.  Suspect uncontrolled DM with stress response.  -hyperglycemia much improved -SQ insulin levemir 50 u BID (120 u at home) and SSI -Once off TF, will likely need to adjust insulin requirments  Best Practice (right click and "Reselect all SmartList Selections" daily)  Diet/type: tubefeeds DVT prophylaxis: systemic heparin GI prophylaxis: PPI Lines: N/A Foley:  N/A Code Status:  full code Last date of multidisciplinary goals of care discussion: pending   Labs   CBC: Recent Labs  Lab 09/20/21 0323 09/20/21 0330 09/20/21 0715 09/21/21 0454  WBC 14.4*  --   --  23.2*  NEUTROABS 10.9*  --   --   --   HGB 13.3 13.9 13.9 13.1  HCT 41.5 41.0 41.0 39.2  MCV 90.2  --   --  87.5  PLT 227  --   --  221     Basic Metabolic Panel: Recent Labs  Lab 09/20/21 0323 09/20/21 0330 09/20/21 0644 09/20/21 0715 09/21/21 0454 09/21/21 1026 09/21/21 1622 09/22/21 0448  NA 136 136  --  135 141  --   --  142  K 4.4 4.6  --  3.8 3.7  --   --  3.1*  CL 98 104  --   --  104  --   --  108  CO2 26  --   --   --  23  --   --  24  GLUCOSE 457* 458*  --   --  170*  --   --  184*  BUN 36* 52*  --   --  35*  --   --  36*  CREATININE 2.17* 2.20*  --   --  2.50*  --   --  2.18*  CALCIUM 9.8  --   --   --  10.0  --   --  9.7  MG  --   --  1.9  --  1.8 1.8 1.8 1.9  PHOS  --   --   --   --  3.1 3.0 3.3 3.4    GFR: Estimated Creatinine Clearance: 35.5 mL/min (A) (by C-G formula based on SCr of 2.18 mg/dL (H)). Recent Labs  Lab 09/20/21 0323 09/21/21 0454  WBC 14.4* 23.2*     Liver Function Tests: Recent Labs   Lab 09/20/21 0323  AST 31  ALT 15  ALKPHOS 104  BILITOT 0.9  PROT 6.9  ALBUMIN 3.1*    No results for input(s): LIPASE, AMYLASE in the last 168 hours. Recent Labs  Lab 09/20/21 0644  AMMONIA 30     ABG    Component Value Date/Time   PHART 7.414 09/20/2021 0715   PCO2ART 40.7 09/20/2021 0715   PO2ART 230 (H) 09/20/2021 0715   HCO3 26.0 09/20/2021  0715   TCO2 27 09/20/2021 0715   O2SAT 100.0 09/20/2021 0715      Coagulation Profile: Recent Labs  Lab 09/20/21 0323  INR 0.9     Cardiac Enzymes: Recent Labs  Lab 09/20/21 0611  CKTOTAL 316*     HbA1C: Hgb A1c MFr Bld  Date/Time Value Ref Range Status  09/20/2021 12:02 PM 12.7 (H) 4.8 - 5.6 % Final    Comment:    (NOTE) Pre diabetes:          5.7%-6.4%  Diabetes:              >6.4%  Glycemic control for   <7.0% adults with diabetes     CBG: Recent Labs  Lab 09/21/21 1139 09/21/21 1532 09/21/21 1934 09/21/21 2327 09/22/21 0339  GLUCAP 166* 168* 243* 213* 174*     Review of Systems:   Unable to complete as patient is altered on mechanical ventilation.   Past Medical History:  She,  has a past medical history of Diabetes ([place]) and Hypertension.   Surgical History:  History reviewed. No pertinent surgical history.   Social History:   reports that she has been smoking cigarettes. She does not have any smokeless tobacco history on file.   Family History:  Her family history is not on file.   Allergies No Known Allergies   Home Medications  Prior to Admission medications   Not on File     Critical care time: 35 minutes     Total critical care time: 30 minutes  Jacqlyn Larsen, Medical Student

## 2021-09-22 NOTE — Procedures (Signed)
Extubation Procedure Note  Patient Details:   Name: Maureen Jones DOB: 1966/10/04 MRN: [id number]   Airway Documentation:    Vent end date: (not recorded) Vent end time: (not recorded)   Evaluation  O2 sats: stable throughout and currently acceptable Complications: No apparent complications Patient did tolerate procedure well. Bilateral Breath Sounds: Clear, Diminished   Yes  Patient extubated to a 6L [place]. Cuff leak was heard. No Stridor noted. MD and RN at bedside. Patient tolerated well.  Renato Gails [facility] 09/22/2021, 10:24 AM

## 2021-09-22 NOTE — Progress Notes (Signed)
[place] Progress Note Patient Name: Maureen Jones DOB: [birth year] MRN: [id number]   Date of Service  09/22/2021  HPI/Events of Note  K low  eICU Interventions  Ordered IV K     Intervention Category Minor Interventions: Electrolytes abnormality - evaluation and management  Tilden Dome 09/22/2021, 6:04 AM

## 2021-09-22 NOTE — Progress Notes (Signed)
Upon shift report patient was following commands,which was witnessed by dayshift RN, Estill Bamberg.  Subsequently patient was very restless, trying to talk over the tube and get out of bed.  Prn versed was given as to prevent the patient from self-extubation.  Will continue to observe and act accordingly.

## 2021-09-22 NOTE — Progress Notes (Signed)
Stroke Neurology Progress Note  IDENTIFYING INFORMATION  Lorah Kalina MR# [id number] 09/22/2021  HISTORY OF PRESENT ILLNESS  Maureen Jones is a 55 y.o. female who  has a past medical history of Diabetes ([place]) and Hypertension. Presenting as a Code Stroke with acute onset of agitation with bizarre behavior 1. Exam reveals findings most consistent with an agitated delirium. DDx includes illicit substance intoxication and psychotic break. Low on the DDx but also possible would be an atypical presentation of seizure. No focal weakness noted. Also with no tremor, jerking, posturing or twitching.   2. CT head: No acute intracranial abnormality. 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 ASPECTS 10.  3. Overall presentation not consistent with stroke. No ataxia, ocular motility defect or pupillary abnormalities to suggest an unusual presentation of top of basilar syndrome or an artery of [place]heron infarction. Risks of IV thrombolysis significantly outweigh potential benefits.  09/20/2021 patient noted to have downward gaze. MRI brain with no acute changes noted. Angiography did not show vascular abnormality as etiology for up gaze palsy. 93/73/4287 metabolic derangements significantly improved and eyes were in primary gaze.  INTERVAL HISTORY   Today she is awake, still intubated and agitated, trying to climb out of bed.  MEDICATIONS    Current Facility-Administered Medications (Endocrine & Metabolic):    insulin aspart (novoLOG) injection 0-15 Units   insulin detemir (LEVEMIR) injection 50 Units   Current Facility-Administered Medications (Cardiovascular):    amLODipine (NORVASC) tablet 10 mg   carvedilol (COREG) tablet 25 mg   hydrALAZINE (APRESOLINE) tablet 25 mg   hydrALAZINE (APRESOLINE) tablet 50 mg   nicardipine (CARDENE) 20mg  in 0.86% saline 260ml IV infusion (0.1 mg/ml)     Current Facility-Administered Medications (Analgesics):    acetaminophen (TYLENOL) tablet  650 mg   fentaNYL (SUBLIMAZE) bolus via infusion 100 mcg   fentaNYL (SUBLIMAZE) injection 100 mcg   fentaNYL (SUBLIMAZE) injection 50 mcg   fentaNYL 2560mcg in NS 219mL (60mcg/ml) infusion-PREMIX   Current Facility-Administered Medications (Hematological):    heparin injection 5,000 Units   Current Facility-Administered Medications (Other):    cefTRIAXone (ROCEPHIN) 2 g in sodium chloride 0.9 % 100 mL IVPB   chlorhexidine gluconate (MEDLINE KIT) (PERIDEX) 0.12 % solution 15 mL   Chlorhexidine Gluconate Cloth 2 % PADS 6 each   dexmedetomidine (PRECEDEX) 400 MCG/100ML (4 mcg/mL) infusion   dextrose 50 % solution 0-50 mL   docusate (COLACE) 50 MG/5ML liquid 100 mg   feeding supplement (GLUCERNA 1.5 CAL) liquid 1,000 mL   MEDLINE mouth rinse   midazolam (VERSED) injection 2 mg   ondansetron (ZOFRAN) injection 4 mg   pantoprazole sodium (PROTONIX) 40 mg/20 mL oral suspension 40 mg   polyethylene glycol (MIRALAX / GLYCOLAX) packet 17 g   potassium chloride 10 mEq in 100 mL IVPB  No current outpatient medications on file.  VITAL SIGNS  Temp:  [98.3 F (36.8 C)-99.4 F (37.4 C)] 99.4 F (37.4 C) (10/12 0800) Pulse Rate:  [65-75] 73 (10/12 0700) Resp:  [24] 24 (10/12 0700) BP: (119-188)/(68-107) 137/84 (10/12 0809) SpO2:  [92 %-96 %] 94 % (10/12 0809) FiO2 (%):  [40 %-50 %] 50 % (10/12 0809) Weight:  [101.9 kg] 101.9 kg (10/12 0500)  PHYSICAL EXAM   General Physical Exam  General: NAD, intubated and agitated, sitting up in bed, trying to climb out of bed. HENT: ET tube in place. Normal external appearance of ears and nose. CV/Chest: No JVD, normal S1S2 Lungs: No audible wheezing/cuff leak. No accessory muscle  use Abdomen: Non distended, non tender Extremities: Warm and well perfused. No appreciable edema, cyanosis or deformity. Skin: No rash. Normal palpation of skin.   Musculoskeletal: No joint tenderness. Normal digits and nails by inspection. No clubbing.  Neurologic  Examination   The patient is intubated but not sedated. EOMI, Pupils equal, round, ~66mm --> ~8mm to light. Moves all extremities purposefully and consistently with good strength.   IMAGING/DIAGNOSTIC STUDIES   MRI brain 09/20/2021: No acute infarction, hemorrhage, hydrocephalus, extra-axial collection or mass lesion. Mild T2 hyperintense signal in the periventricular white matter, likely the sequela of chronic small vessel ischemic disease. 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 Mildly prominent ventricles for age, which may reflect central atrophy.  MRA head and neck 09/20/2021: Normal   EEG: This technically difficult study is suggestive of severe to profound diffuse encephalopathy, nonspecific etiology. No seizures or epileptiform discharges were seen throughout the recording.  Lab Results  Component Value Date   HGBA1C 12.7 (H) 09/20/2021   Glucose (09/20/2021) 458 --->170 (09/21/2021)  09/22/2021  WBC (09/21/2021) 23.2 -------> 18.0 (09/22/2021)    ASSESSMENT AND PLAN  Maureen Jones is a 55 y.o. female who presents with acute hypoxic respiratory failure, HTN emergency and toxic metabolic encephalopathy likely multifactorial. She was treated with ABX for CAP and WBC count has significantly trended down. Today the patient is agitated and though still intubated she is sitting up in bed trying to climb out of bed. Her status continues to improve.  Continue correcting metabolic derangements. Monitor for delirium and avoid sedative/hypnotics and reorient patient, lights on during day, up out of bed. Neurology will follow peripherally.  Electronically signed by:  Lynnae Sandhoff, MD Page: [phone] 09/22/2021, 10:09 AM

## 2021-09-23 ENCOUNTER — Inpatient Hospital Stay (HOSPITAL_COMMUNITY): Payer: Medicare Other

## 2021-09-23 DIAGNOSIS — Z978 Presence of other specified devices: Secondary | ICD-10-CM | POA: Diagnosis not present

## 2021-09-23 DIAGNOSIS — G9340 Encephalopathy, unspecified: Secondary | ICD-10-CM | POA: Diagnosis not present

## 2021-09-23 LAB — BASIC METABOLIC PANEL
Anion gap: 10 (ref 5–15)
BUN: 36 mg/dL — ABNORMAL HIGH (ref 6–20)
CO2: 22 mmol/L (ref 22–32)
Calcium: 9.4 mg/dL (ref 8.9–10.3)
Chloride: 110 mmol/L (ref 98–111)
Creatinine, Ser: 2.02 mg/dL — ABNORMAL HIGH (ref 0.44–1.00)
GFR, Estimated: 29 mL/min — ABNORMAL LOW (ref >60)
Glucose, Bld: 281 mg/dL — ABNORMAL HIGH (ref 70–99)
Potassium: 3.8 mmol/L (ref 3.5–5.1)
Sodium: 142 mmol/L (ref 135–145)

## 2021-09-23 LAB — GLUCOSE, CAPILLARY
Glucose-Capillary: 234 mg/dL — ABNORMAL HIGH (ref 70–99)
Glucose-Capillary: 232 mg/dL — ABNORMAL HIGH (ref 70–99)
Glucose-Capillary: 198 mg/dL — ABNORMAL HIGH (ref 70–99)
Glucose-Capillary: 145 mg/dL — ABNORMAL HIGH (ref 70–99)
Glucose-Capillary: 208 mg/dL — ABNORMAL HIGH (ref 70–99)
Glucose-Capillary: 193 mg/dL — ABNORMAL HIGH (ref 70–99)

## 2021-09-23 MED ORDER — LACTATED RINGERS IV BOLUS
500.0000 mL | Freq: Once | INTRAVENOUS | Status: AC
  Administered 2021-09-23: 500 mL via INTRAVENOUS

## 2021-09-23 MED ORDER — INSULIN DETEMIR 100 UNIT/ML ~~LOC~~ SOLN
55.0000 [IU] | Freq: Two times a day (BID) | SUBCUTANEOUS | Status: DC
  Administered 2021-09-23 – 2021-09-24 (×3): 55 [IU] via SUBCUTANEOUS
  Filled 2021-09-23 (×4): qty 0.55

## 2021-09-23 MED ORDER — HYDRALAZINE HCL 25 MG PO TABS: 25.0000 mg | ORAL_TABLET | Freq: Three times a day (TID) | ORAL | Status: DC | PRN

## 2021-09-23 MED ORDER — METHYLPREDNISOLONE SODIUM SUCC 40 MG IJ SOLR
40.0000 mg | Freq: Once | INTRAMUSCULAR | Status: AC
  Administered 2021-09-24: 40 mg via INTRAVENOUS
  Filled 2021-09-23: qty 1

## 2021-09-23 MED ORDER — LACTATED RINGERS BOLUS PEDS: 500.0000 mL | Freq: Once | INTRAVENOUS | Status: DC

## 2021-09-23 MED ORDER — HYDRALAZINE HCL 50 MG PO TABS
100.0000 mg | ORAL_TABLET | Freq: Three times a day (TID) | ORAL | Status: DC
  Administered 2021-09-23 – 2021-09-24 (×3): 100 mg
  Filled 2021-09-23 (×3): qty 2

## 2021-09-23 NOTE — Progress Notes (Signed)
Patient experiencing hypertension 198/86 at 0300.  Prn per tube hydralazine given at 0246.  Alerted Elink RN at [employer].  Will allow time for prn bp med to have effects.  RN will contact Elink again if this does not decrease bp.

## 2021-09-23 NOTE — Progress Notes (Signed)
RT note. RT called to patient room per RN, patient had self extubated. Upon arrival ETT still in place reading 22cm @ the lips, previously reading 25 @ the lips. Advanced ETT back to 25cm, Chest XR ordered. Patient achieving volumes and sating 95% at this time. RT will continue to monitor.

## 2021-09-23 NOTE — Progress Notes (Signed)
   09/23/21 1200  Provider Notification  Provider Name/Title M. Hunsucker MD  Date Provider Notified 09/23/21  Time Provider Notified 1227  Notification Type Page  Notification Reason Other (Comment) (clarify SBP goal)  Provider response Other (Comment) (orders clarified)  Date of Provider Response 09/23/21  Time of Provider Response 1227   Contradicting SBP goal in notes, PRN med and gtt med.  Clarified with MD that SBP goal is <160. Will maintain SBP <160 at this time.

## 2021-09-23 NOTE — Progress Notes (Signed)
[place] Progress Note Patient Name: Maureen Jones DOB: 1966/03/28 MRN: [id number]   Date of Service  09/23/2021  HPI/Events of Note  May we increase fent gtt ceiling? Pt is on vent, restrained, and maxed on sedation and was found sitting on side of bed  On Fentanyl drip at 200, also receiving prn Fentanyl and midazolam  Wt 102 kg  eICU Interventions  Ordered to increase ceiling to Fentanyl 300 mcg     Intervention Category Minor Interventions: Agitation / anxiety - evaluation and management  Shona Needles Chinmayi Rumer 09/23/2021, 2:23 AM

## 2021-09-23 NOTE — Progress Notes (Addendum)
Inpatient Diabetes Program Recommendations  AACE/ADA: New Consensus Statement on Inpatient Glycemic Control (2015)  Target Ranges:  Prepandial:   less than 140 mg/dL      Peak postprandial:   less than 180 mg/dL (1-2 hours)      Critically ill patients:  140 - 180 mg/dL   Lab Results  Component Value Date   GLUCAP 198 (H) 09/23/2021   HGBA1C 12.7 (H) 09/20/2021    Review of Glycemic Control Results for Maureen Jones, Maureen Jones (MRN [id number]) as of 09/23/2021 13:31  Ref. Range 09/22/2021 19:35 09/22/2021 23:24 09/23/2021 03:30 09/23/2021 07:43 09/23/2021 11:39  Glucose-Capillary Latest Ref Range: 70 - 99 mg/dL 245 (H) 261 (H) 234 (H) 232 (H) 198 (H)   Diabetes history: DM 2 Outpatient Diabetes medications:  Tresiba 120 units daily Current orders for Inpatient glycemic control:  Novolog moderate q 4 hours Levemir 55 units bid Glucerna 50 ml/hr Inpatient Diabetes Program Recommendations:    May consider adding Novolog tube feed coverage 3 units q 4 hours (hold if tube feed held).   Thanks,  Adah Perl, RN, BC-ADM Inpatient Diabetes Coordinator Pager [phone]  (8a-5p)

## 2021-09-23 NOTE — Progress Notes (Signed)
NAMEVirgene Jones, MRN:  [id number], DOB:  27-Nov-1966, LOS: 3 ADMISSION DATE:  09/20/2021, CONSULTATION DATE: 10/10 REFERRING MD:  Dr. Laverta Baltimore, CHIEF COMPLAINT:  AMS   History of Present Illness:  55 y/o F who presented to [facility] on 10/10 with reports of acute altered mental status.   She was reportedly at home and went to smoke a cigarette 0200 with her nephew. She was apparently seen by her family at that time.  After smoking, the family heard a loud thud and she was found altered and confused.  On arrival to the ER she was noted to be agitated, confused, localized to pain and stated "that hurts" with stimulation.  She was seen initially as a CODE STROKE.  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 She was hypertensive with initial pressure of 240/112 with MAP of 136.  Initial labs showed glucose of 437, BUN 36, Sr CR 2.17, CK 316, ammonia 30, Mg 1.9, WBC 14.4. UDS was negative.  COVID & flu screening were negative.  CXR showed low lung volumes, ETT in good position, L>R interstitial opacities.     PCCM called for ICU admission.   Pertinent  Medical History  HTN DM   Significant Hospital Events: Including procedures, antibiotic start and stop dates in addition to other pertinent events   10/10 Admit with acute encephalopathy, hypertensive 10/12 extubated, increased WOB, re-intubated  Interim History / Subjective:  Passed SBT, requiring more sedation meds. Plan solumedrol tomorrow AM prior to extubation  Objective   Blood pressure 137/89, pulse 71, temperature 99.1 F (37.3 C), temperature source Axillary, resp. rate 13, height '5\' 6"'$  (1.676 m), weight 102.5 kg, SpO2 97 %.    Vent Mode: PSV;CPAP FiO2 (%):  [40 %-50 %] 40 % Set Rate:  [24 bmp] 24 bmp Vt Set:  [470 mL] 470 mL PEEP:  [5 cmH20-10 cmH20] 5 cmH20 Pressure Support:  [8 cmH20] 8 cmH20 Plateau Pressure:  [21 cmH20] 21 cmH20   Intake/Output Summary (Last 24  hours) at 09/23/2021 1602 Last data filed at 09/23/2021 1417 Gross per 24 hour  Intake 2652.35 ml  Output 760 ml  Net 1892.35 ml    Filed Weights   09/21/21 0500 09/22/21 0500 09/23/21 0500  Weight: 100.2 kg 101.9 kg 102.5 kg    Examination: General: ill appearing adult female lying in bed on vent HENT: ETT, pupils 28m, anicteric Lungs: non-labored on vent, lungs bilaterally coarse with rhonchi Cardiovascular: S1S2 RRR, no m/r/g Abdomen: protuberant, soft, bsx4 active  Extremities: warm/dry, no edema  Neuro: sedate, moves all extremities spontaneously,  follows commands  Resolved Hospital Problem list     Assessment & Plan:   Acute Hypertensive Emergency  Initial BP 240/112, MAP 136  -amlodipine 10 mg daily, coreg 25 mg BID, hydralazine 100 mg TID --wean cardene --TTE WNL --SBP < 10000000 Acute Metabolic Encephalopathy  Suspect in setting of hypertensive emergency, possible PRES. CT head negative.  -MRI negative -Precedex, fentanyl gtt -hyperglycemia improved   Acute Hypoxic Respiratory Failure  Pulmonary edema vs Aspiration  Tobacco Abuse -PRVC 8cc/kg -wean PEEP / fiO2 for sats >90% -empiric rocephin for aspiration/CAP -cessation counseling when able for tobacco abuse  -Reintubated 10/12 with increased WOB, not mobilizing secretions, stridor, dexamethasone given  AKI  10/2020 cr 1.7. Cr improved after gentle hydration 10/11. -Trend BMP / urinary output -Replace electrolytes as indicated -Avoid nephrotoxic agents as able, ensure adequate renal perfusion  DM with  Hyperglycemia Not DKA, doubt HHS given glucose.  Suspect uncontrolled DM with stress response.  -hyperglycemia improved -SQ insulin levemir 55 u BID (120 u daily at home) and SSI  Best Practice (right click and "Reselect all SmartList Selections" daily)  Diet/type: tubefeeds DVT prophylaxis: systemic heparin GI prophylaxis: PPI Lines: N/A Foley:  N/A Code Status:  full code Last date of  multidisciplinary goals of care discussion: pending   Labs   CBC: Recent Labs  Lab 09/20/21 0323 09/20/21 0330 09/20/21 0715 09/21/21 0454 09/22/21 0448 09/22/21 1226  WBC 14.4*  --   --  23.2* 18.0*  --   NEUTROABS 10.9*  --   --   --   --   --   HGB 13.3 13.9 13.9 13.1 13.0 11.2*  HCT 41.5 41.0 41.0 39.2 40.1 33.0*  MCV 90.2  --   --  87.5 89.7  --   PLT 227  --   --  221 164  --      Basic Metabolic Panel: Recent Labs  Lab 09/20/21 0323 09/20/21 0330 09/20/21 0644 09/20/21 0715 09/21/21 0454 09/21/21 1026 09/21/21 1622 09/22/21 0448 09/22/21 1226 09/22/21 1654 09/23/21 0159  NA 136 136  --  135 141  --   --  142 142  --  142  K 4.4 4.6  --  3.8 3.7  --   --  3.1* 3.5  --  3.8  CL 98 104  --   --  104  --   --  108  --   --  110  CO2 26  --   --   --  23  --   --  24  --   --  22  GLUCOSE 457* 458*  --   --  170*  --   --  184*  --   --  281*  BUN 36* 52*  --   --  35*  --   --  36*  --   --  36*  CREATININE 2.17* 2.20*  --   --  2.50*  --   --  2.18*  --   --  2.02*  CALCIUM 9.8  --   --   --  10.0  --   --  9.7  --   --  9.4  MG  --   --    < >  --  1.8 1.8 1.8 1.9  --  2.0  --   PHOS  --   --   --   --  3.1 3.0 3.3 3.4  --  3.3  --    < > = values in this interval not displayed.    GFR: Estimated Creatinine Clearance: 38.5 mL/min (A) (by C-G formula based on SCr of 2.02 mg/dL (H)). Recent Labs  Lab 09/20/21 0323 09/21/21 0454 09/22/21 0448  WBC 14.4* 23.2* 18.0*     Liver Function Tests: Recent Labs  Lab 09/20/21 0323  AST 31  ALT 15  ALKPHOS 104  BILITOT 0.9  PROT 6.9  ALBUMIN 3.1*    No results for input(s): LIPASE, AMYLASE in the last 168 hours. Recent Labs  Lab 09/20/21 0644  AMMONIA 30     ABG    Component Value Date/Time   PHART 7.455 (H) 09/22/2021 1226   PCO2ART 36.5 09/22/2021 1226   PO2ART 254 (H) 09/22/2021 1226   HCO3 25.7 09/22/2021 1226   TCO2 27 09/22/2021 1226   O2SAT 100.0 09/22/2021 1226  Coagulation  Profile: Recent Labs  Lab 09/20/21 0323  INR 0.9     Cardiac Enzymes: Recent Labs  Lab 09/20/21 0611  CKTOTAL 316*     HbA1C: Hgb A1c MFr Bld  Date/Time Value Ref Range Status  09/20/2021 12:02 PM 12.7 (H) 4.8 - 5.6 % Final    Comment:    (NOTE) Pre diabetes:          5.7%-6.4%  Diabetes:              >6.4%  Glycemic control for   <7.0% adults with diabetes     CBG: Recent Labs  Lab 09/22/21 2324 09/23/21 0330 09/23/21 0743 09/23/21 1139 09/23/21 1526  GLUCAP 261* 234* 232* 198* 145*     Review of Systems:   Unable to complete as patient is altered on mechanical ventilation.   Past Medical History:  She,  has a past medical history of Diabetes ([place]) and Hypertension.   Surgical History:  History reviewed. No pertinent surgical history.   Social History:   reports that she has been smoking cigarettes. She does not have any smokeless tobacco history on file.   Family History:  Her family history is not on file.   Allergies No Known Allergies   Home Medications  Prior to Admission medications   Not on File     Critical care time:     CRITICAL CARE Performed by: Lanier Clam   Total critical care time: 35 minutes  Critical care time was exclusive of separately billable procedures and treating other patients.  Critical care was necessary to treat or prevent imminent or life-threatening deterioration.  Critical care was time spent personally by me on the following activities: development of treatment plan with patient and/or surrogate as well as nursing, discussions with consultants, evaluation of patient's response to treatment, examination of patient, obtaining history from patient or surrogate, ordering and performing treatments and interventions, ordering and review of laboratory studies, ordering and review of radiographic studies, pulse oximetry and re-evaluation of patient's condition.   Lanier Clam, MD [facility] 09/23/2021, 4:02 PM   Please see [email or web address] for contact info   From 7A-7P if no response, please call [phone] After hours, please call ELink [phone]

## 2021-09-23 NOTE — Progress Notes (Signed)
Pt attempting to grab at ETT when placed on PS/CPAP. Mittens placed, ETT tightened to [facility] and soft wrist restraints shortened. Pt does better with minimal stimulation due to anxiety.

## 2021-09-23 NOTE — Progress Notes (Signed)
NAMEHensleigh Jones, MRN:  [id number], DOB:  10-25-1966, LOS: 3 ADMISSION DATE:  09/20/2021, CONSULTATION DATE: 10/10 REFERRING MD:  Dr. Laverta Baltimore, CHIEF COMPLAINT:  AMS   History of Present Illness:  55 y/o F who presented to [facility] on 10/10 with reports of acute altered mental status.   She was reportedly at home and went to smoke a cigarette 0200 with her nephew. She was apparently seen by her family at that time.  After smoking, the family heard a loud thud and she was found altered and confused.  On arrival to the ER she was noted to be agitated, confused, localized to pain and stated "that hurts" with stimulation.  She was seen initially as a CODE STROKE.  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 She was hypertensive with initial pressure of 240/112 with MAP of 136.  Initial labs showed glucose of 437, BUN 36, Sr CR 2.17, CK 316, ammonia 30, Mg 1.9, WBC 14.4. UDS was negative.  COVID & flu screening were negative.  CXR showed low lung volumes, ETT in good position, L>R interstitial opacities.     PCCM called for ICU admission.   Pertinent  Medical History  HTN DM   Significant Hospital Events: Including procedures, antibiotic start and stop dates in addition to other pertinent events   10/10 Admit with acute encephalopathy, hypertensive 10/11 off cardene gtt, hyperglycemia improved and off insulin gtt  10/11 ON restarted cardene gtt d/t hypertensive episodes  10/12 AM normotensive and plan to transition off cardene gtt, start hydralazine 10/12 extubated and re-intubated d/t increased work of breathing and tachypnea  Interim History / Subjective:  ON hypertensive episode, given PRN hydralazine and restarted on Cardene gtt. Found sitting up on side of bed and had pulled up ET tube, which had to be reinserted. Also pulled out OG tube overnight, which was placed again/ Fentanyl ceiling increased from 200 mcg to 300 mcg.    Objective   Blood pressure (!) 156/75, pulse 76, temperature 98.6 F (37 C), temperature source Axillary, resp. rate 18, height '5\' 6"'$  (1.676 m), weight 102.5 kg, SpO2 94 %.    Vent Mode: PRVC FiO2 (%):  [40 %-100 %] 40 % Set Rate:  [24 bmp] 24 bmp Vt Set:  [470 mL] 470 mL PEEP:  [5 cmH20-10 cmH20] 5 cmH20 Pressure Support:  [5 cmH20] 5 cmH20 Plateau Pressure:  [21 cmH20-25 cmH20] 21 cmH20   Intake/Output Summary (Last 24 hours) at 09/23/2021 [id number] Last data filed at 09/23/2021 [id number] Gross per 24 hour  Intake 2951.02 ml  Output 1235 ml  Net 1716.02 ml    Filed Weights   09/21/21 0500 09/22/21 0500 09/23/21 0500  Weight: 100.2 kg 101.9 kg 102.5 kg    Examination: General: ill appearing adult female lying in bed on vent, somnolent HENT: ETT, pupils 21m, anicteric Lungs: non-labored on vent, lungs clear on auscultation anteriorly Cardiovascular: S1S2 RRR, no m/r/g Abdomen: protuberant, soft, bsx4 active  Extremities: warm/dry, no edema  Neuro: sedated on presedex and fentanyl, moves all extremities spontaneously, follows commands   Resolved Hospital Problem list     Assessment & Plan:   Acute Hypertensive Emergency  Long standing hypertension Initial BP 240/112, MAP 136. Hypertensive ON and this AM. Patient is likely having hyperactive delirium overnight and this may be contributing to hypertension. Will increase hydralazine today for better control. -discontinue Cardene gtt -increase hydralazine to 100 mg q8h for BP control -once  renal function improves, transition hydralazine to ACE or ARB -continue amlodipine 10 mg daily, coreg 25 mg BID -TTE WNL -goal SBP < 0000000  Acute Metabolic Encephalopathy  Suspect in setting of hypertensive emergency. CT head negative.  Likely some component of hyperactive delirium at night contributing to agitation. -MRI negative, making PRES less likely -stopped propofol (hyperTG) -Precedex, fentanyl gtt -hyperglycemia improved   -continue to wean sedation as tolerated  Acute Hypoxic Respiratory Failure  Rule out pulmonary edema vs Aspiration  Tobacco Abuse Plan for extubation on 10/14 after administration of solumedrol for airway edema. -PRVC 8cc/kg -wean PEEP / fiO2 for sats >90% -day 3 of empiric rocephin for aspiration/CAP, continue for 2 more days -cessation counseling when able for tobacco abuse   AKI  10/2020 cr 1.7 Adequate UO on 10/12 -Trend BMP / urinary output -Replace electrolytes as indicated -Avoid nephrotoxic agents as able, ensure adequate renal perfusion -Continue TF   DM with Hyperglycemia Not DKA, doubt HHS given glucose.  Suspect uncontrolled DM with stress response.  -increase SQ insulin levemir 55 u BID (120 u at home) and SSI -Once off TF, will likely need to adjust insulin requirments  Best Practice (right click and "Reselect all SmartList Selections" daily)  Diet/type: tubefeeds DVT prophylaxis: systemic heparin GI prophylaxis: PPI Lines: N/A Foley:  N/A Code Status:  full code Last date of multidisciplinary goals of care discussion: pending   Labs   CBC: Recent Labs  Lab 09/20/21 0323 09/20/21 0330 09/20/21 0715 09/21/21 0454 09/22/21 0448 09/22/21 1226  WBC 14.4*  --   --  23.2* 18.0*  --   NEUTROABS 10.9*  --   --   --   --   --   HGB 13.3 13.9 13.9 13.1 13.0 11.2*  HCT 41.5 41.0 41.0 39.2 40.1 33.0*  MCV 90.2  --   --  87.5 89.7  --   PLT 227  --   --  221 164  --      Basic Metabolic Panel: Recent Labs  Lab 09/20/21 0323 09/20/21 0330 09/20/21 0644 09/20/21 0715 09/21/21 0454 09/21/21 1026 09/21/21 1622 09/22/21 0448 09/22/21 1226 09/22/21 1654 09/23/21 0159  NA 136 136  --  135 141  --   --  142 142  --  142  K 4.4 4.6  --  3.8 3.7  --   --  3.1* 3.5  --  3.8  CL 98 104  --   --  104  --   --  108  --   --  110  CO2 26  --   --   --  23  --   --  24  --   --  22  GLUCOSE 457* 458*  --   --  170*  --   --  184*  --   --  281*  BUN 36* 52*   --   --  35*  --   --  36*  --   --  36*  CREATININE 2.17* 2.20*  --   --  2.50*  --   --  2.18*  --   --  2.02*  CALCIUM 9.8  --   --   --  10.0  --   --  9.7  --   --  9.4  MG  --   --    < >  --  1.8 1.8 1.8 1.9  --  2.0  --   PHOS  --   --   --   --  3.1 3.0 3.3 3.4  --  3.3  --    < > = values in this interval not displayed.    GFR: Estimated Creatinine Clearance: 38.5 mL/min (A) (by C-G formula based on SCr of 2.02 mg/dL (H)). Recent Labs  Lab 09/20/21 0323 09/21/21 0454 09/22/21 0448  WBC 14.4* 23.2* 18.0*     Liver Function Tests: Recent Labs  Lab 09/20/21 0323  AST 31  ALT 15  ALKPHOS 104  BILITOT 0.9  PROT 6.9  ALBUMIN 3.1*    No results for input(s): LIPASE, AMYLASE in the last 168 hours. Recent Labs  Lab 09/20/21 0644  AMMONIA 30     ABG    Component Value Date/Time   PHART 7.455 (H) 09/22/2021 1226   PCO2ART 36.5 09/22/2021 1226   PO2ART 254 (H) 09/22/2021 1226   HCO3 25.7 09/22/2021 1226   TCO2 27 09/22/2021 1226   O2SAT 100.0 09/22/2021 1226      Coagulation Profile: Recent Labs  Lab 09/20/21 0323  INR 0.9     Cardiac Enzymes: Recent Labs  Lab 09/20/21 0611  CKTOTAL 316*     HbA1C: Hgb A1c MFr Bld  Date/Time Value Ref Range Status  09/20/2021 12:02 PM 12.7 (H) 4.8 - 5.6 % Final    Comment:    (NOTE) Pre diabetes:          5.7%-6.4%  Diabetes:              >6.4%  Glycemic control for   <7.0% adults with diabetes     CBG: Recent Labs  Lab 09/22/21 1132 09/22/21 1518 09/22/21 1935 09/22/21 2324 09/23/21 0330  GLUCAP 172* 183* 245* 261* 234*     Review of Systems:   Unable to complete as patient is altered on mechanical ventilation.   Past Medical History:  She,  has a past medical history of Diabetes ([place]) and Hypertension.   Surgical History:  History reviewed. No pertinent surgical history.   Social History:   reports that she has been smoking cigarettes. She does not have any smokeless tobacco  history on file.   Family History:  Her family history is not on file.   Allergies No Known Allergies   Home Medications  Prior to Admission medications   Not on File     Critical care time: 35 minutes    Jacqlyn Larsen, Medical Student

## 2021-09-23 NOTE — Progress Notes (Signed)
   09/23/21 1645  Provider Notification  Provider Name/Title JD Rollene Rotunda PA  Date Provider Notified 09/23/21  Time Provider Notified 1655  Notification Type Call  Notification Reason Other (Comment) (UOP low)  Provider response No new orders (continue to monitor)  Date of Provider Response 09/23/21  Time of Provider Response 1655   At 1000 Patient UOP 175m, '@1300'$  bladder scan for 1019m  At 1600 UOP 10071mbladder scan only 56m37mMade PA JD PaynRollene Rotundare, continue to monitor UOP at this time.

## 2021-09-23 NOTE — Progress Notes (Addendum)
The patient had sat at the edge of the bed, while bilaterally restrained at the wrists. She had pulled out her OG tube.  The RN that found her said that she was trying to say the words bathroom over the tube. PRN Versed and fentanyl boluses were given.  RT was called.  New OG tube will be placed.  Chest and abdomen xrays will be obtained to confirm ETT and OG placement.

## 2021-09-24 DIAGNOSIS — I161 Hypertensive emergency: Secondary | ICD-10-CM | POA: Diagnosis not present

## 2021-09-24 DIAGNOSIS — J9601 Acute respiratory failure with hypoxia: Secondary | ICD-10-CM

## 2021-09-24 DIAGNOSIS — G9340 Encephalopathy, unspecified: Secondary | ICD-10-CM | POA: Diagnosis not present

## 2021-09-24 LAB — BASIC METABOLIC PANEL
Anion gap: 9 (ref 5–15)
BUN: 40 mg/dL — ABNORMAL HIGH (ref 6–20)
CO2: 25 mmol/L (ref 22–32)
Calcium: 9.5 mg/dL (ref 8.9–10.3)
Chloride: 111 mmol/L (ref 98–111)
Creatinine, Ser: 2.07 mg/dL — ABNORMAL HIGH (ref 0.44–1.00)
GFR, Estimated: 28 mL/min — ABNORMAL LOW (ref >60)
Glucose, Bld: 131 mg/dL — ABNORMAL HIGH (ref 70–99)
Potassium: 4 mmol/L (ref 3.5–5.1)
Sodium: 145 mmol/L (ref 135–145)

## 2021-09-24 LAB — GLUCOSE, CAPILLARY
Glucose-Capillary: 135 mg/dL — ABNORMAL HIGH (ref 70–99)
Glucose-Capillary: 92 mg/dL (ref 70–99)
Glucose-Capillary: 73 mg/dL (ref 70–99)
Glucose-Capillary: 45 mg/dL — ABNORMAL LOW (ref 70–99)
Glucose-Capillary: 56 mg/dL — ABNORMAL LOW (ref 70–99)
Glucose-Capillary: 137 mg/dL — ABNORMAL HIGH (ref 70–99)
Glucose-Capillary: 121 mg/dL — ABNORMAL HIGH (ref 70–99)
Glucose-Capillary: 69 mg/dL — ABNORMAL LOW (ref 70–99)

## 2021-09-24 MED ORDER — RACEPINEPHRINE HCL 2.25 % IN NEBU: 0.5000 mL | INHALATION_SOLUTION | Freq: Once | RESPIRATORY_TRACT | Status: AC

## 2021-09-24 MED ORDER — DEXAMETHASONE SODIUM PHOSPHATE 10 MG/ML IJ SOLN
6.0000 mg | Freq: Four times a day (QID) | INTRAMUSCULAR | Status: AC
  Administered 2021-09-24 – 2021-09-25 (×4): 6 mg via INTRAVENOUS
  Filled 2021-09-24 (×4): qty 1

## 2021-09-24 MED ORDER — RACEPINEPHRINE HCL 2.25 % IN NEBU
INHALATION_SOLUTION | RESPIRATORY_TRACT | Status: AC
  Administered 2021-09-24: 0.5 mL via RESPIRATORY_TRACT
  Filled 2021-09-24: qty 0.5

## 2021-09-24 MED ORDER — INSULIN DETEMIR 100 UNIT/ML ~~LOC~~ SOLN: 55.0000 [IU] | Freq: Two times a day (BID) | SUBCUTANEOUS | Status: DC

## 2021-09-24 MED ORDER — HYDRALAZINE HCL 20 MG/ML IJ SOLN
10.0000 mg | INTRAMUSCULAR | Status: AC | PRN
  Administered 2021-09-25 – 2021-09-26 (×3): 20 mg via INTRAVENOUS
  Filled 2021-09-24 (×3): qty 1

## 2021-09-24 MED ORDER — FUROSEMIDE 10 MG/ML IJ SOLN
40.0000 mg | Freq: Once | INTRAMUSCULAR | Status: AC
  Administered 2021-09-24: 40 mg via INTRAVENOUS
  Filled 2021-09-24: qty 4

## 2021-09-24 MED ORDER — DEXTROSE 50 % IV SOLN
50.0000 mL | Freq: Once | INTRAVENOUS | Status: AC
  Administered 2021-09-24: 50 mL via INTRAVENOUS

## 2021-09-24 MED ORDER — ALBUTEROL SULFATE (2.5 MG/3ML) 0.083% IN NEBU
2.5000 mg | INHALATION_SOLUTION | Freq: Four times a day (QID) | RESPIRATORY_TRACT | Status: DC
  Administered 2021-09-24 – 2021-09-26 (×9): 2.5 mg via RESPIRATORY_TRACT
  Filled 2021-09-24 (×9): qty 3

## 2021-09-24 MED ORDER — AMLODIPINE BESYLATE 10 MG PO TABS
10.0000 mg | ORAL_TABLET | Freq: Every day | ORAL | Status: DC
  Administered 2021-09-25 – 2021-10-04 (×9): 10 mg via ORAL
  Filled 2021-09-24 (×9): qty 1

## 2021-09-24 MED ORDER — PANTOPRAZOLE SODIUM 40 MG PO TBEC
40.0000 mg | DELAYED_RELEASE_TABLET | Freq: Every day | ORAL | Status: DC
  Administered 2021-09-25 – 2021-09-26 (×2): 40 mg via ORAL
  Filled 2021-09-24 (×2): qty 1

## 2021-09-24 MED ORDER — DEXTROSE 50 % IV SOLN
12.5000 g | INTRAVENOUS | Status: AC
  Administered 2021-09-24: 12.5 g via INTRAVENOUS
  Filled 2021-09-24: qty 50

## 2021-09-24 MED ORDER — CARVEDILOL 25 MG PO TABS
25.0000 mg | ORAL_TABLET | Freq: Two times a day (BID) | ORAL | Status: DC
  Administered 2021-09-24 – 2021-10-04 (×18): 25 mg via ORAL
  Filled 2021-09-24 (×7): qty 1
  Filled 2021-09-24: qty 2
  Filled 2021-09-24 (×6): qty 1
  Filled 2021-09-24: qty 2
  Filled 2021-09-24: qty 1
  Filled 2021-09-24: qty 2
  Filled 2021-09-24 (×2): qty 1

## 2021-09-24 MED ORDER — SODIUM CHLORIDE 3 % IN NEBU
4.0000 mL | INHALATION_SOLUTION | Freq: Four times a day (QID) | RESPIRATORY_TRACT | Status: DC
  Administered 2021-09-24 – 2021-09-26 (×8): 4 mL via RESPIRATORY_TRACT
  Filled 2021-09-24 (×8): qty 4

## 2021-09-24 MED ORDER — DEXTROSE 10 % IV SOLN: INTRAVENOUS | Status: DC

## 2021-09-24 MED ORDER — DEXMEDETOMIDINE HCL IN NACL 400 MCG/100ML IV SOLN
0.4000 ug/kg/h | INTRAVENOUS | Status: DC
  Administered 2021-09-24: 0.8 ug/kg/h via INTRAVENOUS
  Administered 2021-09-24: 0.4 ug/kg/h via INTRAVENOUS
  Administered 2021-09-25: 0.9 ug/kg/h via INTRAVENOUS
  Administered 2021-09-25: 0.6 ug/kg/h via INTRAVENOUS
  Filled 2021-09-24 (×4): qty 100

## 2021-09-24 MED ORDER — RACEPINEPHRINE HCL 2.25 % IN NEBU
0.5000 mL | INHALATION_SOLUTION | RESPIRATORY_TRACT | Status: AC | PRN
  Administered 2021-09-25 – 2021-10-02 (×3): 0.5 mL via RESPIRATORY_TRACT
  Filled 2021-09-24 (×5): qty 0.5

## 2021-09-24 MED ORDER — HYDRALAZINE HCL 50 MG PO TABS
100.0000 mg | ORAL_TABLET | Freq: Three times a day (TID) | ORAL | Status: DC
  Administered 2021-09-24 (×2): 100 mg via ORAL
  Filled 2021-09-24 (×2): qty 2

## 2021-09-24 NOTE — Progress Notes (Signed)
Patient taken off BIPAP so nurse can do swallow test and breathing tx was done while off as well. Will place back on BIPAP once done.

## 2021-09-24 NOTE — Progress Notes (Signed)
[name], MRN:  [id number], DOB:  06-Mar-1966, LOS: 4 ADMISSION DATE:  09/20/2021, CONSULTATION DATE: 10/10 REFERRING MD:  Dr. Laverta Baltimore, CHIEF COMPLAINT:  AMS   History of Present Illness:  55 y/o F who presented to [facility] on 10/10 with reports of acute altered mental status.   She was reportedly at home and went to smoke a cigarette 0200 with her nephew. She was apparently seen by her family at that time.  After smoking, the family heard a loud thud and she was found altered and confused.  On arrival to the ER she was noted to be agitated, confused, localized to pain and stated "that hurts" with stimulation.  She was seen initially as a CODE STROKE.  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 She was hypertensive with initial pressure of 240/112 with MAP of 136.  Initial labs showed glucose of 437, BUN 36, Sr CR 2.17, CK 316, ammonia 30, Mg 1.9, WBC 14.4. UDS was negative.  COVID & flu screening were negative.  CXR showed low lung volumes, ETT in good position, L>R interstitial opacities.     PCCM called for ICU admission.   Pertinent  Medical History  HTN DM   Significant Hospital Events: Including procedures, antibiotic start and stop dates in addition to other pertinent events   10/10 Admit with acute encephalopathy, hypertensive 10/12 extubated, increased WOB, re-intubated  Interim History / Subjective:   No events overnight. Critically ill, intubated Low-grade febrile Slight decreased urine output 700 cc last 24 hours  Objective   Blood pressure (!) 143/83, pulse 74, temperature 99.3 F (37.4 C), temperature source Oral, resp. rate (!) 21, height '5\' 6"'$  (1.676 m), weight 107.2 kg, SpO2 94 %.    Vent Mode: PRVC FiO2 (%):  [35 %-40 %] 35 % Set Rate:  [24 bmp] 24 bmp Vt Set:  [470 mL] 470 mL PEEP:  [5 cmH20] 5 cmH20 Pressure Support:  [8 cmH20] 8 cmH20 Plateau Pressure:  [20 cmH20-22 cmH20] 22 cmH20    Intake/Output Summary (Last 24 hours) at 09/24/2021 [id number] Last data filed at 09/24/2021 [id number] Gross per 24 hour  Intake 2726.69 ml  Output 700 ml  Net 2026.69 ml    Filed Weights   09/22/21 0500 09/23/21 0500 09/24/21 0407  Weight: 101.9 kg 102.5 kg 107.2 kg    Examination: General: Acutely ill appearing adult female lying in bed on vent HENT: ETT, no pallor or icterus, pupils 3 mm reactive to light Lungs: No accessory muscle use, coarse breath sounds bilateral, no rhonchi Cardiovascular: S1S2 RRR, no m/r/g Abdomen: protuberant, soft, bsx4 active  Extremities: warm/dry, no edema  Neuro: Awake, interactive, moves all extremities spontaneously,  follows commands  Chest x-ray 10/13 independently reviewed shows mild bihilar infiltrates  Labs show normal electrolytes, stable creatinine, high CBGs  Resolved Hospital Problem list     Assessment & Plan:   Acute Hypertensive Emergency  Initial BP 240/112, MAP 136  Echo moderate LVH -amlodipine 10 mg daily, coreg 25 mg BID, hydralazine 100 mg TID -- Attempting to wean cardene --SBP < 0000000  Acute Metabolic Encephalopathy , resolved Suspect in setting of hypertensive emergency, possible PRES. CT head negative. MRI negative -Precedex, fentanyl gtt with goal RASS 0 while on vent  Acute Hypoxic Respiratory Failure  Pulmonary edema vs Aspiration  Tobacco Abuse -Reintubated 10/12 with increased WOB, not mobilizing secretions, stridor, received steroids, NO laryngeal edema noted -PRVC 8cc/kg -Spontaneous breathing trials,  check cuff leak and if okay will proceed with extubation today -empiric rocephin for aspiration/CAP -Tobacco cessation counseling    AKI  10/2020 cr 1.7. Cr improved after gentle hydration 10/11. -Trend BMP / urinary output, liters positive we will give 1 dose of Lasix to facilitate extubation -Replace electrolytes as indicated -Avoid nephrotoxic agents as able, ensure adequate renal perfusion  DM with  Hyperglycemia Not DKA, doubt HHS given glucose.  Suspect uncontrolled DM with stress response, worsened by steroids  -SQ insulin levemir 55 u BID (120 u daily at home) and SSI  Best Practice (right click and "Reselect all SmartList Selections" daily)  Diet/type: tubefeeds DVT prophylaxis: systemic heparin GI prophylaxis: PPI Lines: N/A Foley:  N/A Code Status:  full code Last date of multidisciplinary goals of care discussion: Full CODE STATUS  Labs   CBC: Recent Labs  Lab 09/20/21 0323 09/20/21 0330 09/20/21 0715 09/21/21 0454 09/22/21 0448 09/22/21 1226  WBC 14.4*  --   --  23.2* 18.0*  --   NEUTROABS 10.9*  --   --   --   --   --   HGB 13.3 13.9 13.9 13.1 13.0 11.2*  HCT 41.5 41.0 41.0 39.2 40.1 33.0*  MCV 90.2  --   --  87.5 89.7  --   PLT 227  --   --  221 164  --      Basic Metabolic Panel: Recent Labs  Lab 09/20/21 0323 09/20/21 0330 09/20/21 0644 09/21/21 0454 09/21/21 1026 09/21/21 1622 09/22/21 0448 09/22/21 1226 09/22/21 1654 09/23/21 0159 09/24/21 0349  NA 136 136   < > 141  --   --  142 142  --  142 145  K 4.4 4.6   < > 3.7  --   --  3.1* 3.5  --  3.8 4.0  CL 98 104  --  104  --   --  108  --   --  110 111  CO2 26  --   --  23  --   --  24  --   --  22 25  GLUCOSE 457* 458*  --  170*  --   --  184*  --   --  281* 131*  BUN 36* 52*  --  35*  --   --  36*  --   --  36* 40*  CREATININE 2.17* 2.20*  --  2.50*  --   --  2.18*  --   --  2.02* 2.07*  CALCIUM 9.8  --   --  10.0  --   --  9.7  --   --  9.4 9.5  MG  --   --    < > 1.8 1.8 1.8 1.9  --  2.0  --   --   PHOS  --   --   --  3.1 3.0 3.3 3.4  --  3.3  --   --    < > = values in this interval not displayed.    GFR: Estimated Creatinine Clearance: 38.5 mL/min (A) (by C-G formula based on SCr of 2.07 mg/dL (H)). Recent Labs  Lab 09/20/21 0323 09/21/21 0454 09/22/21 0448  WBC 14.4* 23.2* 18.0*     Liver Function Tests: Recent Labs  Lab 09/20/21 0323  AST 31  ALT 15  ALKPHOS 104   BILITOT 0.9  PROT 6.9  ALBUMIN 3.1*    No results for input(s): LIPASE, AMYLASE in the last 168 hours. Recent Labs  Lab 09/20/21 0644  AMMONIA 30     ABG    Component Value Date/Time   PHART 7.455 (H) 09/22/2021 1226   PCO2ART 36.5 09/22/2021 1226   PO2ART 254 (H) 09/22/2021 1226   HCO3 25.7 09/22/2021 1226   TCO2 27 09/22/2021 1226   O2SAT 100.0 09/22/2021 1226      Coagulation Profile: Recent Labs  Lab 09/20/21 0323  INR 0.9     Cardiac Enzymes: Recent Labs  Lab 09/20/21 0611  CKTOTAL 316*     HbA1C: Hgb A1c MFr Bld  Date/Time Value Ref Range Status  09/20/2021 12:02 PM 12.7 (H) 4.8 - 5.6 % Final    Comment:    (NOTE) Pre diabetes:          5.7%-6.4%  Diabetes:              >6.4%  Glycemic control for   <7.0% adults with diabetes     CBG: Recent Labs  Lab 09/23/21 1526 09/23/21 1928 09/23/21 2314 09/24/21 0331 09/24/21 0810  GLUCAP 145* 208* 193* 135* 92    My independent critical care time x 33 m  Kara Mead MD. FCCP. [place] [facility] Pager : [phone]  If no response to pager , please call [phone] until 7 pm After 7:00 pm call Elink  [phone]   09/24/2021

## 2021-09-24 NOTE — Progress Notes (Signed)
[facility] Progress Note Patient Name: Maureen Jones DOB: January 03, 1966 MRN: [id number]   Date of Service  09/24/2021  HPI/Events of Note  Patient with lingering post-extubation mild stridor.  eICU Interventions  24 hours of iv Decadron ordered.        [name] Randy Castrejon 09/24/2021, 9:24 PM

## 2021-09-24 NOTE — Plan of Care (Signed)
Neurology plan of care:   No complaints of pain. She just wants to get the ETT out.  Plan is for extubation this am. On no sedation except for 5 mcg Fentanyl.   She is awake and alert. PERRL, sluggish, or difficult to see due to very small pupils. No speech due to ETT, but she can communicate per body language and certain movements. Moving all extremities purposefully. Grips are 4+ and symmetric. Is able to lift LEs off bed. Sensation is intact to light touch and symmetrical.   Her prior neurology imaging has been unremarkable. Last time NP saw her she was obtunded and not reactive, so today is a big improvement.   NP thinks at this time, neurology has nothing else to add, unless she should have an acute change in mental status.   Please call us back as needed.   Clance Boll, MSN, APN-BC Neurology Nurse Practitioner Pager [phone]

## 2021-09-24 NOTE — Progress Notes (Signed)
NAMEAraiyah Jones, MRN:  [id number], DOB:  Apr 20, 1966, LOS: 4 ADMISSION DATE:  09/20/2021, CONSULTATION DATE: 10/10 REFERRING MD:  Dr. Laverta [place], CHIEF COMPLAINT:  AMS   History of Present Illness:  55 y/o F who presented to [facility] on 10/10 with reports of acute altered mental status.   She was reportedly at home and went to smoke a cigarette 0200 with her nephew. She was apparently seen by her family at that time.  After smoking, the family heard a loud thud and she was found altered and confused.  On arrival to the ER she was noted to be agitated, confused, localized to pain and stated "that hurts" with stimulation.  She was seen initially as a CODE STROKE.  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 She was hypertensive with initial pressure of 240/112 with MAP of 136.  Initial labs showed glucose of 437, BUN 36, Sr CR 2.17, CK 316, ammonia 30, Mg 1.9, WBC 14.4. UDS was negative.  COVID & flu screening were negative.  CXR showed low lung volumes, ETT in good position, L>R interstitial opacities.     PCCM called for ICU admission.   Pertinent  Medical History  HTN DM   Significant Hospital Events: Including procedures, antibiotic start and stop dates in addition to other pertinent events   10/10 Admit with acute encephalopathy, hypertensive 10/11 off cardene gtt, hyperglycemia improved and off insulin gtt  10/11 ON restarted cardene gtt d/t hypertensive episodes  10/12 AM normotensive and plan to transition off cardene gtt, start hydralazine 10/12 extubated and re-intubated d/t increased work of breathing and tachypnea 10/13 extubated  Interim History / Subjective:  No acute events overnight. Doing well with SBT this AM and following commands, very alert.  Objective   Blood pressure (!) 145/90, pulse 84, temperature 100.2 F (37.9 C), temperature source Axillary, resp. rate (!) 36, height '5\' 6"'$  (1.676 m), weight 107.2 kg,  SpO2 93 %.    Vent Mode: PSV;CPAP FiO2 (%):  [35 %-40 %] 40 % Set Rate:  [24 bmp] 24 bmp Vt Set:  [470 mL] 470 mL PEEP:  [5 cmH20] 5 cmH20 Pressure Support:  [5 cmH20-8 cmH20] 5 cmH20 Plateau Pressure:  [20 cmH20-22 cmH20] 22 cmH20   Intake/Output Summary (Last 24 hours) at 09/24/2021 1107 Last data filed at 09/24/2021 1011 Gross per 24 hour  Intake 3162.93 ml  Output 1075 ml  Net 2087.93 ml    Filed Weights   09/22/21 0500 09/23/21 0500 09/24/21 0407  Weight: 101.9 kg 102.5 kg 107.2 kg    Examination: General: Alert appearing adult female sitting up in bed intubated HENT: ETT anicteric Lungs: non-labored on vent, lungs clear on auscultation anteriorly Cardiovascular: S1S2 RRR, no m/r/g Abdomen: protuberant, soft, bsx4 active  Extremities: warm/dry, no edema  Neuro: alert and moves all extremities spontaneously, follows commands   Resolved Hospital Problem list     Assessment & Plan:   Acute Hypertensive Emergency  Long standing hypertension Initial BP 240/112, MAP 136. BP under better control with increased hydralazine. -discontinue Cardene gtt -continue hydralazine to 100 mg TID, amlodipine 10 mg daily, coreg 25 mg BID -once renal function improves, transition hydralazine to ACE or ARB -TTE with LVH -goal SBP < 0000000  Acute Metabolic Encephalopathy  Suspect in setting of hypertensive emergency. CT head negative.  -MRI negative, making PRES less likely -Mental status much improved and ready for extubation  Acute Hypoxic Respiratory Failure  Rule out pulmonary edema vs Aspiration  Tobacco Abuse Successfully extubated 10/14 and no stridor appreciated. Patient is doing well with normal work and breathing and maintaining oxygen saturation. -Continue self suction for clearance of secretions -Advanced liquid diet as tolerated -day 4 of empiric rocephin for aspiration/CAP, continue through 10/15 for 5 days total -cessation counseling when able for tobacco abuse    AKI  10/2020 cr 1.7 Given low UO on 10/13 and increased hilar congestion on CXR, given 1 dose of lasix prior to extubation. -Trend BMP / urinary output -Replace electrolytes as indicated -Avoid nephrotoxic agents as able, ensure adequate renal perfusion  DM with Hyperglycemia Not DKA, doubt HHS given glucose.  Suspect uncontrolled DM with stress response.  -increase SQ insulin levemir 55 u BID (120 u at home) and SSI -Once off TF, will likely need to adjust insulin requirments  Best Practice (right click and "Reselect all SmartList Selections" daily)  Diet/type: tubefeeds DVT prophylaxis: systemic heparin GI prophylaxis: PPI Lines: N/A Foley:  N/A Code Status:  full code Last date of multidisciplinary goals of care discussion: pending   Labs   CBC: Recent Labs  Lab 09/20/21 0323 09/20/21 0330 09/20/21 0715 09/21/21 0454 09/22/21 0448 09/22/21 1226  WBC 14.4*  --   --  23.2* 18.0*  --   NEUTROABS 10.9*  --   --   --   --   --   HGB 13.3 13.9 13.9 13.1 13.0 11.2*  HCT 41.5 41.0 41.0 39.2 40.1 33.0*  MCV 90.2  --   --  87.5 89.7  --   PLT 227  --   --  221 164  --      Basic Metabolic Panel: Recent Labs  Lab 09/20/21 0323 09/20/21 0330 09/20/21 0644 09/21/21 0454 09/21/21 1026 09/21/21 1622 09/22/21 0448 09/22/21 1226 09/22/21 1654 09/23/21 0159 09/24/21 0349  NA 136 136   < > 141  --   --  142 142  --  142 145  K 4.4 4.6   < > 3.7  --   --  3.1* 3.5  --  3.8 4.0  CL 98 104  --  104  --   --  108  --   --  110 111  CO2 26  --   --  23  --   --  24  --   --  22 25  GLUCOSE 457* 458*  --  170*  --   --  184*  --   --  281* 131*  BUN 36* 52*  --  35*  --   --  36*  --   --  36* 40*  CREATININE 2.17* 2.20*  --  2.50*  --   --  2.18*  --   --  2.02* 2.07*  CALCIUM 9.8  --   --  10.0  --   --  9.7  --   --  9.4 9.5  MG  --   --    < > 1.8 1.8 1.8 1.9  --  2.0  --   --   PHOS  --   --   --  3.1 3.0 3.3 3.4  --  3.3  --   --    < > = values in this interval  not displayed.    GFR: Estimated Creatinine Clearance: 38.5 mL/min (A) (by C-G formula based on SCr of 2.07 mg/dL (H)). Recent Labs  Lab 09/20/21 0323 09/21/21 0454 09/22/21 0448  WBC  14.4* 23.2* 18.0*     Liver Function Tests: Recent Labs  Lab 09/20/21 0323  AST 31  ALT 15  ALKPHOS 104  BILITOT 0.9  PROT 6.9  ALBUMIN 3.1*    No results for input(s): LIPASE, AMYLASE in the last 168 hours. Recent Labs  Lab 09/20/21 0644  AMMONIA 30     ABG    Component Value Date/Time   PHART 7.455 (H) 09/22/2021 1226   PCO2ART 36.5 09/22/2021 1226   PO2ART 254 (H) 09/22/2021 1226   HCO3 25.7 09/22/2021 1226   TCO2 27 09/22/2021 1226   O2SAT 100.0 09/22/2021 1226      Coagulation Profile: Recent Labs  Lab 09/20/21 0323  INR 0.9     Cardiac Enzymes: Recent Labs  Lab 09/20/21 0611  CKTOTAL 316*     HbA1C: Hgb A1c MFr Bld  Date/Time Value Ref Range Status  09/20/2021 12:02 PM 12.7 (H) 4.8 - 5.6 % Final    Comment:    (NOTE) Pre diabetes:          5.7%-6.4%  Diabetes:              >6.4%  Glycemic control for   <7.0% adults with diabetes     CBG: Recent Labs  Lab 09/23/21 1526 09/23/21 1928 09/23/21 2314 09/24/21 0331 09/24/21 0810  GLUCAP 145* 208* 193* 135* 92     Review of Systems:   Unable to complete as patient is altered on mechanical ventilation.   Past Medical History:  She,  has a past medical history of Diabetes ([place]) and Hypertension.   Surgical History:  History reviewed. No pertinent surgical history.   Social History:   reports that she has been smoking cigarettes. She does not have any smokeless tobacco history on file.   Family History:  Her family history is not on file.   Allergies No Known Allergies   Home Medications  Prior to Admission medications   Not on File     Critical care time: 35 minutes    Jacqlyn Larsen, Medical Student

## 2021-09-24 NOTE — Progress Notes (Addendum)
Inpatient Diabetes Program Recommendations  AACE/ADA: New Consensus Statement on Inpatient Glycemic Control (2015)  Target Ranges:  Prepandial:   less than 140 mg/dL      Peak postprandial:   less than 180 mg/dL (1-2 hours)      Critically ill patients:  140 - 180 mg/dL   Lab Results  Component Value Date   GLUCAP 73 09/24/2021   HGBA1C 12.7 (H) 09/20/2021    Review of Glycemic Control Results for Maureen Jones, Maureen Jones (MRN [id number]) as of 09/24/2021 14:21  Ref. Range 09/23/2021 19:28 09/23/2021 23:14 09/24/2021 03:31 09/24/2021 08:10 09/24/2021 12:05  Glucose-Capillary Latest Ref Range: 70 - 99 mg/dL 208 (H) 193 (H) 135 (H) 92 73   Diabetes history:  DM 2 Outpatient Diabetes medications:  Tresiba 120 units daily Current orders for Inpatient glycemic control:  Novolog moderate q 4 hours Levemir 55 units bid Inpatient Diabetes Program Recommendations:    Note this patient was extubated.  May consider reducing Levemir to 30 units bid and possibly add D10 40 ml/hr until diet started - Last blood sugar was 73 mg/dL.   Thanks,  Adah Perl, RN, BC-ADM Inpatient Diabetes Coordinator Pager [phone]  (8a-5p)

## 2021-09-24 NOTE — Progress Notes (Signed)
[facility] Progress Note Patient Name: Maureen Jones DOB: 07-May-1966 MRN: [id number]   Date of Service  09/24/2021  HPI/Events of Note  Patient having mild residual stridor after iv Decadron x 1, PRN Racemic Epinephrine ordered (it was reportedly effective when given earlier today.  eICU Interventions  Racemic Epinephrine, patient made NPO until ST evaluates her in the AM (there is concern for aspiration), oral Hydralazine discontinued and PRN iv Hydralazine substituted.        [name] Enid Maultsby 09/24/2021, 10:19 PM

## 2021-09-24 NOTE — Procedures (Signed)
Extubation Procedure Note  Patient Details:   Name: Maureen Jones DOB: September 13, 1966 MRN: [id number]   Airway Documentation:    Vent end date: 09/24/21 Vent end time: 0955   Evaluation  O2 sats: stable throughout Complications: No apparent complications Patient did tolerate procedure well. Bilateral Breath Sounds: Clear, Diminished   Yes   Patient did not have a cuff leak prior to extubation. MD Elsworth Soho was made aware and wanted to proceed with extubation. Patient was extubated to 3 LPM nasal cannula. BBS were rhonchi but patient has a strong productive cough. No stridor was noted. Patient compliant of being hoarse. RT will continue to monitor.   Juletta Berhe M 09/24/2021, 11:14 AM

## 2021-09-24 NOTE — Progress Notes (Signed)
   09/24/21 1600  Provider Notification  Provider Name/Title JD Rollene Rotunda PA, A. Elsworth Soho MD  Date Provider Notified 09/24/21  Time Provider Notified 1615  Notification Type Page  Notification Reason Critical result  Test performed and critical result CBG 45, 56, then 137. (2 amps D50 given)  Date Critical Result Received 09/24/21  Time Critical Result Received 1556  Provider response See new orders (D10 @ 48m/hr.)  Date of Provider Response 09/24/21  Time of Provider Response 1620   Patient remained alert and oriented. D10 started as patient remains NPO except sips with meds at this time.

## 2021-09-24 NOTE — Progress Notes (Signed)
RN placed patient back on BIPAP

## 2021-09-25 ENCOUNTER — Inpatient Hospital Stay (HOSPITAL_COMMUNITY): Payer: Medicare Other

## 2021-09-25 DIAGNOSIS — G9340 Encephalopathy, unspecified: Secondary | ICD-10-CM | POA: Diagnosis not present

## 2021-09-25 DIAGNOSIS — I160 Hypertensive urgency: Secondary | ICD-10-CM | POA: Diagnosis not present

## 2021-09-25 DIAGNOSIS — J9601 Acute respiratory failure with hypoxia: Secondary | ICD-10-CM | POA: Diagnosis not present

## 2021-09-25 LAB — CBC
HCT: 37.3 % (ref 36.0–46.0)
Hemoglobin: 11.5 g/dL — ABNORMAL LOW (ref 12.0–15.0)
MCH: 28.8 pg (ref 26.0–34.0)
MCHC: 30.8 g/dL (ref 30.0–36.0)
MCV: 93.3 fL (ref 80.0–100.0)
Platelets: 216 10*3/uL (ref 150–400)
RBC: 4 MIL/uL (ref 3.87–5.11)
RDW: 13.7 % (ref 11.5–15.5)
WBC: 21.1 10*3/uL — ABNORMAL HIGH (ref 4.0–10.5)
nRBC: 0 % (ref 0.0–0.2)

## 2021-09-25 LAB — CULTURE, BLOOD (ROUTINE X 2)
Culture: NO GROWTH
Culture: NO GROWTH

## 2021-09-25 LAB — GLUCOSE, CAPILLARY
Glucose-Capillary: 102 mg/dL — ABNORMAL HIGH (ref 70–99)
Glucose-Capillary: 136 mg/dL — ABNORMAL HIGH (ref 70–99)
Glucose-Capillary: 234 mg/dL — ABNORMAL HIGH (ref 70–99)
Glucose-Capillary: 239 mg/dL — ABNORMAL HIGH (ref 70–99)
Glucose-Capillary: 265 mg/dL — ABNORMAL HIGH (ref 70–99)
Glucose-Capillary: 347 mg/dL — ABNORMAL HIGH (ref 70–99)
Glucose-Capillary: 46 mg/dL — ABNORMAL LOW (ref 70–99)
Glucose-Capillary: 78 mg/dL (ref 70–99)

## 2021-09-25 LAB — BASIC METABOLIC PANEL
Anion gap: 8 (ref 5–15)
BUN: 42 mg/dL — ABNORMAL HIGH (ref 6–20)
CO2: 26 mmol/L (ref 22–32)
Calcium: 9.6 mg/dL (ref 8.9–10.3)
Chloride: 113 mmol/L — ABNORMAL HIGH (ref 98–111)
Creatinine, Ser: 2.19 mg/dL — ABNORMAL HIGH (ref 0.44–1.00)
GFR, Estimated: 26 mL/min — ABNORMAL LOW (ref >60)
Glucose, Bld: 94 mg/dL (ref 70–99)
Potassium: 4.1 mmol/L (ref 3.5–5.1)
Sodium: 147 mmol/L — ABNORMAL HIGH (ref 135–145)

## 2021-09-25 LAB — PHOSPHORUS: Phosphorus: 3.8 mg/dL (ref 2.5–4.6)

## 2021-09-25 LAB — MAGNESIUM: Magnesium: 2.4 mg/dL (ref 1.7–2.4)

## 2021-09-25 MED ORDER — ORAL CARE MOUTH RINSE
15.0000 mL | Freq: Two times a day (BID) | OROMUCOSAL | Status: DC
  Administered 2021-09-26 – 2021-10-05 (×16): 15 mL via OROMUCOSAL

## 2021-09-25 MED ORDER — CHLORHEXIDINE GLUCONATE 0.12 % MT SOLN
15.0000 mL | Freq: Two times a day (BID) | OROMUCOSAL | Status: DC
  Administered 2021-09-25 – 2021-10-05 (×20): 15 mL via OROMUCOSAL
  Filled 2021-09-25 (×14): qty 15

## 2021-09-25 MED ORDER — DEXTROSE 50 % IV SOLN
25.0000 g | INTRAVENOUS | Status: AC
  Administered 2021-09-25: 25 g via INTRAVENOUS

## 2021-09-25 MED ORDER — ACETAMINOPHEN 325 MG PO TABS
650.0000 mg | ORAL_TABLET | ORAL | Status: DC | PRN
  Administered 2021-09-25 – 2021-10-04 (×3): 650 mg via ORAL
  Filled 2021-09-25 (×3): qty 2

## 2021-09-25 NOTE — Progress Notes (Signed)
NAMEVaune Jones, MRN:  [id number], DOB:  1966-12-04, LOS: 5 ADMISSION DATE:  09/20/2021, CONSULTATION DATE: 10/10 REFERRING MD:  Dr. Laverta Jones, CHIEF COMPLAINT:  AMS   History of Present Illness:  55 y/o F who presented to [facility] on 10/10 with reports of acute altered mental status.   She was reportedly at home and went to smoke a cigarette 0200 with her nephew. She was apparently seen by her family at that time.  After smoking, the family heard a loud thud and she was found altered and confused.  On arrival to the ER she was noted to be agitated, confused, localized to pain and stated "that hurts" with stimulation.  She was seen initially as a CODE STROKE.  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 She was hypertensive with initial pressure of 240/112 with MAP of 136.  Initial labs showed glucose of 437, BUN 36, Sr CR 2.17, CK 316, ammonia 30, Mg 1.9, WBC 14.4. UDS was negative.  COVID & flu screening were negative.  CXR showed low lung volumes, ETT in good position, L>R interstitial opacities.     PCCM called for ICU admission.   Pertinent  Medical History  HTN DM   Significant Hospital Events: Including procedures, antibiotic start and stop dates in addition to other pertinent events   10/10 Admit with acute encephalopathy, hypertensive 10/12 extubated, increased WOB, re-intubated 10/14 extubated , lingering stridor  Interim History / Subjective:   Extubated yesterday, developed mild stridor and hoarseness. Received racemic epi x2 over the last 24 hours + another dose of Decadron Mild agitation requiring restart of Precedex, then restraints overnight Developed hypoglycemia, started on D10 and rate increased  Objective   Blood pressure (!) 139/93, pulse 76, temperature 98 F (36.7 C), temperature source Axillary, resp. rate 15, height '5\' 6"'$  (1.676 m), weight 107.2 kg, SpO2 98 %.    Vent Mode: BIPAP;PCV FiO2 (%):  [35  %-40 %] 35 % Set Rate:  [10 bmp] 10 bmp PEEP:  [5 cmH20] 5 cmH20 Pressure Support:  [5 cmH20] 5 cmH20   Intake/Output Summary (Last 24 hours) at 09/25/2021 0839 Last data filed at 09/25/2021 0800 Gross per 24 hour  Intake 2520.97 ml  Output 1600 ml  Net 920.97 ml    Filed Weights   09/22/21 0500 09/23/21 0500 09/24/21 0407  Weight: 101.9 kg 102.5 kg 107.2 kg    Examination: General: Acutely ill-appearing, no distress, able to phonate in full sentences HENT: ETT, no pallor or icterus, pupils 3 mm reactive to light Lungs: No accessory muscle use, mild upper airway stridor, coarse breath sounds bilateral Cardiovascular: S1S2 RRR, no m/r/g Abdomen: protuberant, soft, bsx4 active  Extremities: warm/dry, no edema  Neuro: Awake, interactive, moves all extremities spontaneously,  follows commands  Chest x-ray 10/15 independently reviewed, improved bilateral infiltrates  Labs show normal electrolytes, slight increase in creatinine and sodium, mild leukocytosis  Resolved Hospital Problem list     Assessment & Plan:   Acute Hypertensive Emergency  Initial BP 240/112, MAP 136  Echo moderate LVH -P.o. meds on hold -amlodipine 10 mg daily, coreg 25 mg BID, hydralazine 100 mg TID -- On IV Cardene, hopefully once able to take orally can taper this to off --SBP < 0000000  Acute Metabolic Encephalopathy , resolving Suspect in setting of hypertensive emergency, possible PRES. CT head negative. MRI negative -Precedex with goal RASS 0, taper to off as she improves  Acute Hypoxic  Respiratory Failure  Pulmonary edema vs Aspiration  Tobacco Abuse -Reintubated 10/12 with increased WOB, not mobilizing secretions, stridor, received steroids, NO laryngeal edema noted -Extubated 10/14 with lingering stridor, improved this morning -Continue dexamethasone for 2 more doses -empiric rocephin for aspiration/CAP -Tobacco cessation counseling    AKI  10/2020 cr 1.7. Cr improved after gentle  hydration 10/11. -Hold further Lasix -Replace electrolytes as indicated -Avoid nephrotoxic agents as able, ensure adequate renal perfusion  DM with Hyperglycemia Not DKA, doubt HHS given glucose.  Suspect uncontrolled DM with stress response, worsened by steroids -On D10 while n.p.o., can DC once she starts oral  -SQ insulin levemir 55 u BID (120 u daily at home) on hold while n.p.o.  - ct  SSI  Best Practice (right click and "Reselect all SmartList Selections" daily)  Diet/type: tubefeeds and NPO DVT prophylaxis: prophylactic heparin  GI prophylaxis: PPI Lines: N/A Foley:  N/A Code Status:  full code Last date of multidisciplinary goals of care discussion: Full CODE STATUS  Labs   CBC: Recent Labs  Lab 09/20/21 0323 09/20/21 0330 09/20/21 0715 09/21/21 0454 09/22/21 0448 09/22/21 1226 09/25/21 0316  WBC 14.4*  --   --  23.2* 18.0*  --  21.1*  NEUTROABS 10.9*  --   --   --   --   --   --   HGB 13.3   < > 13.9 13.1 13.0 11.2* 11.5*  HCT 41.5   < > 41.0 39.2 40.1 33.0* 37.3  MCV 90.2  --   --  87.5 89.7  --  93.3  PLT 227  --   --  221 164  --  216   < > = values in this interval not displayed.     Basic Metabolic Panel: Recent Labs  Lab 09/21/21 0454 09/21/21 1026 09/21/21 1622 09/22/21 0448 09/22/21 1226 09/22/21 1654 09/23/21 0159 09/24/21 0349 09/25/21 0316  NA 141  --   --  142 142  --  142 145 147*  K 3.7  --   --  3.1* 3.5  --  3.8 4.0 4.1  CL 104  --   --  108  --   --  110 111 113*  CO2 23  --   --  24  --   --  '22 25 26  '$ GLUCOSE 170*  --   --  184*  --   --  281* 131* 94  BUN 35*  --   --  36*  --   --  36* 40* 42*  CREATININE 2.50*  --   --  2.18*  --   --  2.02* 2.07* 2.19*  CALCIUM 10.0  --   --  9.7  --   --  9.4 9.5 9.6  MG 1.8 1.8 1.8 1.9  --  2.0  --   --  2.4  PHOS 3.1 3.0 3.3 3.4  --  3.3  --   --  3.8    GFR: Estimated Creatinine Clearance: 36.4 mL/min (A) (by C-G formula based on SCr of 2.19 mg/dL (H)). Recent Labs  Lab  09/20/21 0323 09/21/21 0454 09/22/21 0448 09/25/21 0316  WBC 14.4* 23.2* 18.0* 21.1*     Liver Function Tests: Recent Labs  Lab 09/20/21 0323  AST 31  ALT 15  ALKPHOS 104  BILITOT 0.9  PROT 6.9  ALBUMIN 3.1*    No results for input(s): LIPASE, AMYLASE in the last 168 hours. Recent Labs  Lab 09/20/21 0644  AMMONIA  30     ABG    Component Value Date/Time   PHART 7.455 (H) 09/22/2021 1226   PCO2ART 36.5 09/22/2021 1226   PO2ART 254 (H) 09/22/2021 1226   HCO3 25.7 09/22/2021 1226   TCO2 27 09/22/2021 1226   O2SAT 100.0 09/22/2021 1226      Coagulation Profile: Recent Labs  Lab 09/20/21 0323  INR 0.9     Cardiac Enzymes: Recent Labs  Lab 09/20/21 0611  CKTOTAL 316*     HbA1C: Hgb A1c MFr Bld  Date/Time Value Ref Range Status  09/20/2021 12:02 PM 12.7 (H) 4.8 - 5.6 % Final    Comment:    (NOTE) Pre diabetes:          5.7%-6.4%  Diabetes:              >6.4%  Glycemic control for   <7.0% adults with diabetes     CBG: Recent Labs  Lab 09/24/21 2011 09/25/21 0010 09/25/21 0042 09/25/21 0337 09/25/21 0802  GLUCAP 121* 46* 102* 347* 78    My independent critical care time x 32 m  Kara Mead MD. FCCP. [facility] Pager : [phone]  If no response to pager , please call [phone] until 7 pm After 7:00 pm call Elink  [phone]   09/25/2021

## 2021-09-25 NOTE — Evaluation (Signed)
Physical Therapy Evaluation Patient Details Name: Maureen Jones MRN: [id number] DOB: 15-Mar-1966 Today's Date: 09/25/2021  History of Present Illness  55 year old female admitted with AMS 10/10, intubated, stroke w/u negative. Diagnosed with acute metabolic encephalopathy in the setting of hypertensive emergency, possible PRES. Extubated but reintubated 10/12 with increased WOB. Extubated 10/14 with lingering stridor, improving 10/15 am.  PMH includes: HTN, DM.   Clinical Impression  Pt presents with condition above and deficits mentioned below, see PT Problem List. PTA, she was independent and living with her sister and her sister's 2 teenage children in a mobile home with 4 STE with a handrail. Currently, pt is very impulsive with poor awareness into her deficits, safety, and situation. At this time, she will require 24/7 assistance for safety due to her cognitive and balance deficits and impulsive tendencies. Pt limited in mobility away from EOB due to her BP being elevated. Pt required minA to prevent posterior LOB with transfer to stand and with lateral steps at EOB without UE support, +2 for safety. Based on pt's current cognitive and functional status, if pt has 24/7 assistance available then recommending follow-up with HHPT, but if pt does not have the necessary level of care available then may need to consider a short-term rehab stay at a SNF. Will continue to follow acutely.     Recommendations for follow up therapy are one component of a multi-disciplinary discharge planning process, led by the attending physician.  Recommendations may be updated based on patient status, additional functional criteria and insurance authorization.  Follow Up Recommendations Home health PT;Supervision/Assistance - 24 hour    Equipment Recommendations  3in1 (PT) (TBA need for RW)    Recommendations for Other Services       Precautions / Restrictions Precautions Precautions: Fall Precaution  Comments: SBP<160 Restrictions Weight Bearing Restrictions: No      Mobility  Bed Mobility Overal bed mobility: Needs Assistance Bed Mobility: Supine to Sit;Sit to Supine     Supine to sit: Min guard Sit to supine: Min guard   General bed mobility comments: for safety as pt is quick and impulsive with movements.    Transfers Overall transfer level: Needs assistance Equipment used: None Transfers: Sit to/from Stand Sit to Stand: Min assist;+2 safety/equipment         General transfer comment: for safety and balance, posterior bias with transfers requiring min assist to correct. Quick to power up.  Ambulation/Gait Ambulation/Gait assistance: Min assist;+2 safety/equipment Gait Distance (Feet): 3 Feet Assistive device: None Gait Pattern/deviations: Decreased stride length;Leaning posteriorly Gait velocity: reduced Gait velocity interpretation: <1.31 ft/sec, indicative of household ambulator General Gait Details: Pt with posterior trunk sway, needing minA to stabilize, +2 for safety as pt is impulsive and sits without warning. Lateral steps at EOB, held off on further mobility due to elevated BP.  Stairs            Wheelchair Mobility    Modified Rankin (Stroke Patients Only) Modified Rankin (Stroke Patients Only) Pre-Morbid Rankin Score: No symptoms Modified Rankin: Moderately severe disability     Balance Overall balance assessment: Needs assistance Sitting-balance support: No upper extremity supported;Feet supported Sitting balance-[name]: Fair Sitting balance - Comments: posterior lean dynamically Postural control: Posterior lean Standing balance support: No upper extremity supported;During functional activity Standing balance-[name]: Fair Standing balance comment: Able to stand without UE support but needs minA due to posterior trunk sway and impulsivity  Pertinent Vitals/Pain Pain Assessment:  0-10 Pain Score: 8  Pain Location: throat Pain Descriptors / Indicators: Tender Pain Intervention(s): Limited activity within patient's tolerance;Monitored during session;Repositioned    Home Living Family/patient expects to be discharged to:: Private residence Living Arrangements: Other relatives (sister and her sister's 54 y.o. and 23 y.o. children) Available Help at Discharge: Family;Available 24 hours/day Type of Home: Mobile home Home Access: Stairs to enter Entrance Stairs-Rails: Left (ascending) Entrance Stairs-Number of Steps: 4 Home Layout: One level Home Equipment: None      Prior Function Level of Independence: Independent         Comments: Her sister drives her. Pt on disability.     Hand Dominance   Dominant Hand: Right    Extremity/Trunk Assessment   Upper Extremity Assessment Upper Extremity Assessment: Defer to OT evaluation    Lower Extremity Assessment Lower Extremity Assessment: LLE deficits/detail;RLE deficits/detail RLE Deficits / Details: MMT scores of 4+ to 5 grossly; hx of peripheral neuropathy; gross incoordination RLE Sensation: history of peripheral neuropathy RLE Coordination: decreased gross motor LLE Deficits / Details: MMT scores of 4+ to 5 grossly; hx of peripheral neuropathy; gross incoordination LLE Sensation: history of peripheral neuropathy LLE Coordination: decreased gross motor    Cervical / Trunk Assessment Cervical / Trunk Assessment: Normal  Communication   Communication: No difficulties  Cognition Arousal/Alertness: Awake/alert Behavior During Therapy: Impulsive Overall Cognitive Status: No family/caregiver present to determine baseline cognitive functioning Area of Impairment: Attention;Following commands;Awareness;Problem solving;Safety/judgement;Memory                   Current Attention Level: Sustained Memory: Decreased short-term memory Following Commands: Follows one step commands consistently;Follows  one step commands with increased time Safety/Judgement: Decreased awareness of deficits;Decreased awareness of safety Awareness: Emergent Problem Solving: Slow processing;Difficulty sequencing;Requires verbal cues General Comments: patient oriented, impulsive and poor awareness to safety and deficits. Decreased overall awareness, suprised when pointed out she was wearing oxygen.  Pt reports "swallowing a creature" and RN reports pt hallucinating last night. Pt reports decreased recall, and noted requires cueing to problem solve simple ADL task of donning deodorant. Increased time and several attempts to correctly read clock.      General Comments General comments (skin integrity, edema, etc.): BP elevated at EOB, RN present and providing medication; pt on 6L upon entry, 4L upon exit with SpO2 >95%    Exercises     Assessment/Plan    PT Assessment Patient needs continued PT services  PT Problem List Decreased activity tolerance;Decreased balance;Decreased mobility;Decreased coordination;Decreased cognition;Decreased knowledge of use of DME;Decreased safety awareness;Cardiopulmonary status limiting activity;Impaired sensation       PT Treatment Interventions DME instruction;Gait training;Stair training;Functional mobility training;Therapeutic activities;Therapeutic exercise;Balance training;Neuromuscular re-education;Cognitive remediation;Patient/family education    PT Goals (Current goals can be found in the Care Plan section)  Acute Rehab PT Goals Patient Stated Goal: to feel better PT Goal Formulation: With patient Time For Goal Achievement: 10/09/21 Potential to Achieve Goals: Good    Frequency Min 3X/week   Barriers to discharge        Co-evaluation PT/OT/SLP Co-Evaluation/Treatment: Yes Reason for Co-Treatment: Necessary to address cognition/behavior during functional activity;For patient/therapist safety;To address functional/ADL transfers PT goals addressed during session:  Mobility/safety with mobility;Balance OT goals addressed during session: ADL's and self-care       AM-PAC PT "6 Clicks" Mobility  Outcome Measure Help needed turning from your back to your side while in a flat bed without using bedrails?: A Little Help needed moving from  lying on your back to sitting on the side of a flat bed without using bedrails?: A Little Help needed moving to and from a bed to a chair (including a wheelchair)?: A Little Help needed standing up from a chair using your arms (e.g., wheelchair or bedside chair)?: A Little Help needed to walk in hospital room?: A Little Help needed climbing 3-5 steps with a railing? : A Lot 6 Click Score: 17    End of Session Equipment Utilized During Treatment: Oxygen Activity Tolerance: Patient tolerated treatment well;Treatment limited secondary to medical complications (Comment) (HTN) Patient left: in bed;with call bell/phone within reach;with bed alarm set;with SCD's reapplied Nurse Communication: Mobility status;Other (comment) (mental status, impulsive) PT Visit Diagnosis: Unsteadiness on feet (R26.81);Other abnormalities of gait and mobility (R26.89);Difficulty in walking, not elsewhere classified (R26.2)    Time: [id number] PT Time Calculation (min) (ACUTE ONLY): 23 min   Charges:   PT Evaluation $PT Eval Moderate Complexity: 1 Mod          Moishe Spice, PT, DPT Acute Rehabilitation Services  Pager: [phone] Office: [phone]   Orvan Falconer 09/25/2021, 3:30 PM

## 2021-09-25 NOTE — Progress Notes (Signed)
Hypoglycemic Event  CBG: 46 09/25/2021 0010  Treatment: D50 50 mL (25 gm)  Symptoms: None  Follow-up CBG: Time: 0042 CBG Result:102  Possible Reasons for Event: Other  Comments/MD notified: MD notified about two consecutive hypoglycemic episodes    Leretha Pol

## 2021-09-25 NOTE — Progress Notes (Signed)
Hypoglycemic Event  CBG: 69 09/24/21 1935  Treatment: D50 25 mL (12.5 gm)  Symptoms: None  Follow-up CBG: Time: 2011 CBG Result:121  Possible Reasons for Event: Other     Leretha Pol

## 2021-09-25 NOTE — Evaluation (Signed)
Clinical/Bedside Swallow Evaluation Patient Details  Name: Maureen Jones MRN: [id number] Date of Birth: 1966-05-01  Today's Date: 09/25/2021 Time: SLP Start Time (ACUTE ONLY): 4 SLP Stop Time (ACUTE ONLY): 1103 SLP Time Calculation (min) (ACUTE ONLY): 22 min  Past Medical History:  Past Medical History:  Diagnosis Date   Diabetes ([place])    Hypertension    Past Surgical History: History reviewed. No pertinent surgical history. HPI:  55 year old female admitted with AMS 10/10, intubated, stroke w/u negative. Diagnosed with acute metabolic encephalopathy in the setting of hypertensive emergency, possible PRES. Extubated but reintubated 10/12 with increased WOB, not mobilizing secretions, stridor, received steroids, NO laryngeal edema noted. Extubated 10/14 with lingering stridor, improving 10/15 am. SLP consulted for swallow evaluation.    Assessment / Plan / Recommendation  Clinical Impression  Patient presents with evidence of a mild pharyngeal phase dysphagia. Notable dysphonia characterized by breathy, hoarse vocal quality with mild stridor indicative of pos intubation glottal irritation and suspected edema. Despite this however, patient able to consume pos via self fed small single bites and sips, no straws, with min-no s/s of aspiration. Throat clear x 1 while consuming thin liquid and cough x 1 while consuming solids. Difficult to determine origin given baseline cough however vocal quality unchanged from baseline throughout po trials and patient reports no sensation of difficulty. Although risk for aspiration remains, recommend initiation of a po diet with strict precautions. Patient has progressed since yesterday and should only improve from a swallowing standpoint now that she has been extubated. Education provided to patient adn Therapist, sports. Please hold pos with any s/s of aspiration. SLP to see next date for tolerance and potential need for instrumental testing if s/s persist. SLP Visit  Diagnosis: Dysphagia, pharyngeal phase (R13.13)    Aspiration Risk  Moderate aspiration risk    Diet Recommendation Dysphagia 3 (Mech soft);Thin liquid   Liquid Administration via: Cup;No straw Medication Administration: Crushed with puree Supervision: Patient able to self feed;Full supervision/cueing for compensatory strategies Compensations: Slow rate;Small sips/bites Postural Changes: Seated upright at 90 degrees    Other  Recommendations Oral Care Recommendations: Oral care BID    Recommendations for follow up therapy are one component of a multi-disciplinary discharge planning process, led by the attending physician.  Recommendations may be updated based on patient status, additional functional criteria and insurance authorization.  Follow up Recommendations None      Frequency and Duration min 2x/week  1 week       Prognosis Prognosis for Safe Diet Advancement: Good      Swallow Study   General HPI: 55 year old female admitted with AMS 10/10, intubated, stroke w/u negative. Diagnosed with acute metabolic encephalopathy in the setting of hypertensive emergency, possible PRES. Extubated but reintubated 10/12 with increased WOB, not mobilizing secretions, stridor, received steroids, NO laryngeal edema noted. Extubated 10/14 with lingering stridor, improving 10/15 am. SLP consulted for swallow evaluation. Type of Study: Bedside Swallow Evaluation Diet Prior to this Study: NPO Temperature Spikes Noted: No Respiratory Status: Nasal cannula History of Recent Intubation: Yes Length of Intubations (days): 4 days (x 2 intubations) Date extubated: 09/24/21 Behavior/Cognition: Alert;Cooperative;Pleasant mood Oral Cavity Assessment: Within Functional Limits Oral Care Completed by SLP: Recent completion by staff Oral Cavity - Dentition: Adequate natural dentition Vision: Functional for self-feeding Self-Feeding Abilities: Able to feed self Patient Positioning: Upright in  bed Baseline Vocal Quality: Breathy;Hoarse;Other (comment) (mild stridor) Volitional Cough: Strong Volitional Swallow: Able to elicit    Oral/Motor/Sensory Function Overall Oral  Motor/Sensory Function: Within functional limits   Ice Chips Ice chips: Within functional limits Presentation: Spoon   Thin Liquid Thin Liquid: Within functional limits Presentation: Cup;Self Fed    Nectar Thick Nectar Thick Liquid: Not tested   Honey Thick Honey Thick Liquid: Not tested   Puree Puree: Within functional limits Presentation: Spoon;Self Fed   Solid     Solid: Impaired Presentation: Self Fed Oral Phase Functional Implications:  (cough x 1 of unknown origin)     Kaylani Fromme MA, CCC-SLP  Ashtin Rosner Meryl 09/25/2021,11:20 AM

## 2021-09-25 NOTE — Evaluation (Signed)
Occupational Therapy Evaluation Patient Details Name: Maureen Jones MRN: [id number] DOB: 15-Apr-1966 Today's Date: 09/25/2021   History of Present Illness 55 year old female admitted with AMS 10/10, intubated, stroke w/u negative. Diagnosed with acute metabolic encephalopathy in the setting of hypertensive emergency, possible PRES. Extubated but reintubated 10/12 with increased WOB. Extubated 10/14 with lingering stridor, improving 10/15 am.  PMH includes: HTN, DM.   Clinical Impression   PTA patient reports independent ADLs, mobility but not driving. Admitted for above and presenting with problem list below, including impaired balance, activity tolerance, cognition.  She currently requires min assist +2 safety for transfers and side stepping at EOB, up to min assist for ADLs.  She presents with decreased attention, recall, problem solving, and awareness, but oriented.  She requires min cueing for problem solving through basic self care tasks, and she is impulsive with mobility tasks.  Believe she will benefit from continued OT services while admitted and after dc at [facility] level given 24/7 support. Will follow acutely.      Recommendations for follow up therapy are one component of a multi-disciplinary discharge planning process, led by the attending physician.  Recommendations may be updated based on patient status, additional functional criteria and insurance authorization.   Follow Up Recommendations  Home health OT;Supervision/Assistance - 24 hour    Equipment Recommendations  3 in 1 bedside commode    Recommendations for Other Services Speech consult     Precautions / Restrictions Precautions Precautions: Fall Precaution Comments: SBP<160 Restrictions Weight Bearing Restrictions: No      Mobility Bed Mobility Overal bed mobility: Needs Assistance Bed Mobility: Supine to Sit;Sit to Supine     Supine to sit: Min guard Sit to supine: Min guard   General bed mobility  comments: for safety    Transfers Overall transfer level: Needs assistance   Transfers: Sit to/from Stand Sit to Stand: Min assist;+2 safety/equipment         General transfer comment: for safety and balance, posterior bias with transfers requiring min assist to correct    Balance Overall balance assessment: Needs assistance Sitting-balance support: No upper extremity supported;Feet supported Sitting balance-[name]: Fair Sitting balance - Comments: posterior lean dynamically   Standing balance support: No upper extremity supported;During functional activity Standing balance-[name]: Fair                             ADL either performed or assessed with clinical judgement   ADL Overall ADL's : Needs assistance/impaired     Grooming: Set up;Sitting   Upper Body Bathing: Set up;Sitting   Lower Body Bathing: Minimal assistance;+2 for safety/equipment;Sit to/from stand   Upper Body Dressing : Minimal assistance;Sitting   Lower Body Dressing: Minimal assistance;Sit to/from stand;+2 for safety/equipment   Toilet Transfer: Minimal assistance;+2 for safety/equipment;Ambulation Toilet Transfer Details (indicate cue type and reason): simulated side stepping towards HOB         Functional mobility during ADLs: Minimal assistance;+2 for safety/equipment;Cueing for sequencing;Cueing for safety General ADL Comments: pt limited by cognition, balance and safety     Vision   Additional Comments: further assessment- requires cueing to locate clock on L side, able to read clock numbers with increased time     Perception     Praxis      Pertinent Vitals/Pain Pain Assessment: 0-10 Pain Score: 8  Pain Location: throat Pain Descriptors / Indicators: Tender Pain Intervention(s): Limited activity within patient's tolerance;Monitored during session;Repositioned  Hand Dominance Right   Extremity/Trunk Assessment Upper Extremity Assessment Upper  Extremity Assessment: Generalized weakness (reports bil carpal tunnel)   Lower Extremity Assessment Lower Extremity Assessment: LLE deficits/detail;RLE deficits/detail RLE Sensation: history of peripheral neuropathy LLE Sensation: history of peripheral neuropathy       Communication Communication Communication: No difficulties   Cognition Arousal/Alertness: Awake/alert Behavior During Therapy: Impulsive Overall Cognitive Status: No family/caregiver present to determine baseline cognitive functioning Area of Impairment: Attention;Following commands;Awareness;Problem solving;Safety/judgement;Memory                   Current Attention Level: Sustained Memory: Decreased short-term memory Following Commands: Follows one step commands consistently;Follows one step commands with increased time Safety/Judgement: Decreased awareness of deficits;Decreased awareness of safety Awareness: Emergent Problem Solving: Slow processing;Difficulty sequencing;Requires verbal cues General Comments: patient oriented, impulsive and poor awareness to safety and deficits. Decreased overall awareness, suprised when pointed out she was wearing oxygen.  Pt reports "swallowing a creature" and RN reports pt hallucinating last night. Pt reports decreased recall, and noted requires cueing to problem solve simple ADL task of donning deodorant. Increased time and several attempts to correctly read clock.   General Comments  BP elevated at EOB, RN present and providing medication; pt on 6L upon entry, 4L upon exit with SpO2 >95%    Exercises     Shoulder Instructions      Home Living Family/patient expects to be discharged to:: Private residence Living Arrangements: Other relatives (sister and her 74 y.o. and 67 y.o.) Available Help at Discharge: Family;Available 24 hours/day Type of Home: Mobile home Home Access: Stairs to enter Entrance Stairs-Number of Steps: 4 Entrance Stairs-Rails: Left  (ascending) Home Layout: One level     Bathroom Shower/Tub: Teacher, early years/pre: Standard     Home Equipment: None          Prior Functioning/Environment Level of Independence: Independent        Comments: Her sister drives her. Pt on disability.        OT Problem List: Decreased activity tolerance;Impaired balance (sitting and/or standing);Decreased cognition;Decreased safety awareness;Decreased knowledge of use of DME or AE;Decreased knowledge of precautions      OT Treatment/Interventions: Self-care/ADL training;Therapeutic exercise;DME and/or AE instruction;Therapeutic activities;Visual/perceptual remediation/compensation;Balance training;Patient/family education;Cognitive remediation/compensation    OT Goals(Current goals can be found in the care plan section) Acute Rehab OT Goals Patient Stated Goal: to feel better OT Goal Formulation: With patient Time For Goal Achievement: 10/09/21 Potential to Achieve Goals: Good  OT Frequency: Min 2X/week   Barriers to D/C:            Co-evaluation PT/OT/SLP Co-Evaluation/Treatment: Yes Reason for Co-Treatment: For patient/therapist safety;To address functional/ADL transfers;Necessary to address cognition/behavior during functional activity   OT goals addressed during session: ADL's and self-care      AM-PAC OT "6 Clicks" Daily Activity     Outcome Measure Help from another person eating meals?: A Little Help from another person taking care of personal grooming?: A Little Help from another person toileting, which includes using toliet, bedpan, or urinal?: A Little Help from another person bathing (including washing, rinsing, drying)?: A Little Help from another person to put on and taking off regular upper body clothing?: A Little Help from another person to put on and taking off regular lower body clothing?: A Little 6 Click Score: 18   End of Session Equipment Utilized During Treatment: Oxygen Nurse  Communication: Mobility status  Activity Tolerance: Patient tolerated treatment well Patient left: in  bed;with call bell/phone within reach;with bed alarm set  OT Visit Diagnosis: Other abnormalities of gait and mobility (R26.89);Other symptoms and signs involving cognitive function                Time: 1422-1445 OT Time Calculation (min): 23 min Charges:  OT General Charges $OT Visit: 1 Visit OT Evaluation $OT Eval Moderate Complexity: 1 Mod  Jolaine Artist, OT Acute Rehabilitation Services Pager [phone] Office [phone]   Delight Stare 09/25/2021, 3:07 PM

## 2021-09-25 NOTE — Progress Notes (Signed)
[facility] Progress Note Patient Name: Maureen Jones DOB: 1966/08/27 MRN: [id number]   Date of Service  09/25/2021  HPI/Events of Note  Patient with hypoglycemia requiring treatment with D 50 % water x 2 despite D 10 % gtt at 30 ml / hour.  eICU Interventions  D 10 % water gtt increased to 75 ml / hour.        [name] Maureen Jones 09/25/2021, 12:51 AM

## 2021-09-25 NOTE — Progress Notes (Signed)
Pt was placed on CPAP with no complications, vitals are stable.

## 2021-09-26 DIAGNOSIS — N179 Acute kidney failure, unspecified: Secondary | ICD-10-CM

## 2021-09-26 DIAGNOSIS — G9340 Encephalopathy, unspecified: Secondary | ICD-10-CM | POA: Diagnosis not present

## 2021-09-26 DIAGNOSIS — I161 Hypertensive emergency: Secondary | ICD-10-CM | POA: Diagnosis not present

## 2021-09-26 LAB — GLUCOSE, CAPILLARY
Glucose-Capillary: 309 mg/dL — ABNORMAL HIGH (ref 70–99)
Glucose-Capillary: 214 mg/dL — ABNORMAL HIGH (ref 70–99)
Glucose-Capillary: 302 mg/dL — ABNORMAL HIGH (ref 70–99)
Glucose-Capillary: 305 mg/dL — ABNORMAL HIGH (ref 70–99)
Glucose-Capillary: 236 mg/dL — ABNORMAL HIGH (ref 70–99)
Glucose-Capillary: 244 mg/dL — ABNORMAL HIGH (ref 70–99)

## 2021-09-26 LAB — CULTURE, RESPIRATORY W GRAM STAIN: Culture: NORMAL

## 2021-09-26 LAB — CBC
HCT: 35.5 % — ABNORMAL LOW (ref 36.0–46.0)
Hemoglobin: 10.7 g/dL — ABNORMAL LOW (ref 12.0–15.0)
MCH: 28.5 pg (ref 26.0–34.0)
MCHC: 30.1 g/dL (ref 30.0–36.0)
MCV: 94.7 fL (ref 80.0–100.0)
Platelets: 266 10*3/uL (ref 150–400)
RBC: 3.75 MIL/uL — ABNORMAL LOW (ref 3.87–5.11)
RDW: 13.7 % (ref 11.5–15.5)
WBC: 22.7 10*3/uL — ABNORMAL HIGH (ref 4.0–10.5)
nRBC: 0 % (ref 0.0–0.2)

## 2021-09-26 LAB — BASIC METABOLIC PANEL
Anion gap: 9 (ref 5–15)
BUN: 55 mg/dL — ABNORMAL HIGH (ref 6–20)
CO2: 21 mmol/L — ABNORMAL LOW (ref 22–32)
Calcium: 9.5 mg/dL (ref 8.9–10.3)
Chloride: 111 mmol/L (ref 98–111)
Creatinine, Ser: 2.45 mg/dL — ABNORMAL HIGH (ref 0.44–1.00)
GFR, Estimated: 23 mL/min — ABNORMAL LOW (ref >60)
Glucose, Bld: 307 mg/dL — ABNORMAL HIGH (ref 70–99)
Potassium: 4.4 mmol/L (ref 3.5–5.1)
Sodium: 141 mmol/L (ref 135–145)

## 2021-09-26 LAB — MAGNESIUM: Magnesium: 2.3 mg/dL (ref 1.7–2.4)

## 2021-09-26 LAB — PHOSPHORUS: Phosphorus: 3.5 mg/dL (ref 2.5–4.6)

## 2021-09-26 MED ORDER — ALBUTEROL SULFATE (2.5 MG/3ML) 0.083% IN NEBU
2.5000 mg | INHALATION_SOLUTION | Freq: Four times a day (QID) | RESPIRATORY_TRACT | Status: DC | PRN
  Administered 2021-09-27: 2.5 mg via RESPIRATORY_TRACT
  Filled 2021-09-26 (×2): qty 3

## 2021-09-26 MED ORDER — MELATONIN 3 MG PO TABS
3.0000 mg | ORAL_TABLET | Freq: Once | ORAL | Status: AC
  Administered 2021-09-26: 3 mg via ORAL
  Filled 2021-09-26: qty 1

## 2021-09-26 MED ORDER — INSULIN DETEMIR 100 UNIT/ML ~~LOC~~ SOLN
20.0000 [IU] | Freq: Two times a day (BID) | SUBCUTANEOUS | Status: DC
  Administered 2021-09-26 (×2): 20 [IU] via SUBCUTANEOUS
  Filled 2021-09-26 (×5): qty 0.2

## 2021-09-26 MED ORDER — MELATONIN 5 MG PO TABS
5.0000 mg | ORAL_TABLET | Freq: Every evening | ORAL | Status: DC | PRN
  Administered 2021-09-26 – 2021-10-02 (×2): 5 mg via ORAL
  Filled 2021-09-26 (×2): qty 1

## 2021-09-26 NOTE — Progress Notes (Signed)
Speech Language Pathology Treatment: Dysphagia  Patient Details Name: Maureen Jones MRN: [id number] DOB: Apr 27, 1966 Today's Date: 09/26/2021 Time: [id number] SLP Time Calculation (min) (ACUTE ONLY): 16 min  Assessment / Plan / Recommendation Clinical Impression  Pt seen for ongoing dysphagia management following extubation.  RN reports good tolerance of current diet. Pt refused crushed meds, but took medication whole with liquid without difficulty per report. Pt's vocal quality remains harsh, but improving. Voice breathy at times, but pt was able to attain phonation when requested.  No further stridor noted; RN reports respiratory status has improved. Pt reports her throat still feels uncomfortable, and oropharynx appears red and slightly swollen. Today pt exhibited immediate cough with first sip of water by cup, but tolerated all other trials, including serial sips of thin liquid by straw, with no clinical s/s of aspiration.  Pt fed self puree with spoon without difficulty and ate regular solid graham cracker.  There was good oral clearance of solids.   Recommend advancing to regular texture diet with thin liquids.    HPI HPI: 55 year old female admitted with AMS 10/10, intubated, stroke w/u negative. Diagnosed with acute metabolic encephalopathy in the setting of hypertensive emergency, possible PRES. Extubated but reintubated 10/12 with increased WOB, not mobilizing secretions, stridor, received steroids, NO laryngeal edema noted. Extubated 10/14 with lingering stridor, improving 10/15 am. SLP consulted for swallow evaluation.      SLP Plan  Continue with current plan of care      Recommendations for follow up therapy are one component of a multi-disciplinary discharge planning process, led by the attending physician.  Recommendations may be updated based on patient status, additional functional criteria and insurance authorization.    Recommendations  Diet recommendations: Regular;Thin  liquid Liquids provided via: Cup;Straw Medication Administration: Whole meds with liquid (As tolerated) Supervision: Patient able to self feed Compensations: Slow rate;Small sips/bites                Oral Care Recommendations: Oral care BID Follow up Recommendations: None SLP Visit Diagnosis: Dysphagia, pharyngeal phase (R13.13) Plan: Continue with current plan of care       [place] , [place], [place] Office: [phone]; Pager (10/16): [phone]  09/26/2021, 12:15 PM

## 2021-09-26 NOTE — Progress Notes (Signed)
Continuously have attempted to explain the importance of the nasal cannula when the patient is resting to maintain sufficient oxygen saturation. I have entered the room multiple times to reapply nasal cannula and patient removes as soon as I leave the room. The patient has been redirected and educated with no success.   Will monitor patients oxygen saturation closely for the remainder of my shift.

## 2021-09-26 NOTE — Progress Notes (Signed)
[place] Progress Note Patient Name: Maureen Jones DOB: Jan 04, 1966 MRN: [id number]   Date of Service  09/26/2021  HPI/Events of Note  Patient complaining of insomnia, requesting Melatonin.  eICU Interventions  Melatonin 3 mg po x 1 ordered.        [name] Gavinn Collard 09/26/2021, 4:08 AM

## 2021-09-26 NOTE — Progress Notes (Signed)
NAMEAllyna Jones, MRN:  [id number], DOB:  1966/10/21, LOS: 6 ADMISSION DATE:  09/20/2021, CONSULTATION DATE: 10/10 REFERRING MD:  Dr. Laverta Baltimore, CHIEF COMPLAINT:  AMS   History of Present Illness:  55 y/o F who presented to [facility] on 10/10 with reports of acute altered mental status.   She was reportedly at home and went to smoke a cigarette 0200 with her nephew. She was apparently seen by her family at that time.  After smoking, the family heard a loud thud and she was found altered and confused.  On arrival to the ER she was noted to be agitated, confused, localized to pain and stated "that hurts" with stimulation.  She was seen initially as a CODE STROKE.  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 She was hypertensive with initial pressure of 240/112 with MAP of 136.  Initial labs showed glucose of 437, BUN 36, Sr CR 2.17, CK 316, ammonia 30, Mg 1.9, WBC 14.4. UDS was negative.  COVID & flu screening were negative.  CXR showed low lung volumes, ETT in good position, L>R interstitial opacities.     PCCM called for ICU admission.   Pertinent  Medical History  HTN DM   Significant Hospital Events: Including procedures, antibiotic start and stop dates in addition to other pertinent events   10/10 Admit with acute encephalopathy, hypertensive 10/12 extubated, increased WOB, re-intubated 10/14 extubated , lingering stridor, Received racemic epi x2 over the last 24 hours + another dose of Decadron, Mild agitation requiring restart of Precedex 10/15 swallow evaluation >> dysphagia 3 diet  Interim History / Subjective:  Less agitation , Precedex tapered to off during the day but moving around in bed a lot and Posey restraint placed overnight. Used CPAP This morning on 2 L nasal cannula which she keeps taking off   Objective   Blood pressure (!) 146/70, pulse 78, temperature 98.2 F (36.8 C), temperature source Oral, resp. rate  17, height '5\' 6"'$  (1.676 m), weight 107.2 kg, SpO2 96 %.    FiO2 (%):  [35 %] 35 %   Intake/Output Summary (Last 24 hours) at 09/26/2021 1126 Last data filed at 09/26/2021 1000 Gross per 24 hour  Intake 1471.25 ml  Output 1900 ml  Net -428.75 ml    Filed Weights   09/22/21 0500 09/23/21 0500 09/24/21 0407  Weight: 101.9 kg 102.5 kg 107.2 kg    Examination: General: Acutely ill-appearing, no distress, moving around in bed HENT: ETT, no pallor or icterus, pupils 3 mm reactive to light Lungs: Mild upper airway pseudo wheeze transmitted to lowers, no accessory muscle use, no crackles Cardiovascular: S1S2 RRR, no m/r/g Abdomen: protuberant, soft, bsx4 active  Extremities: warm/dry, no edema  Neuro: Awake, interactive, moves all extremities spontaneously,  follows commands  Chest x-ray 10/15 independently reviewed, improved bilateral infiltrates  Labs show normal electrolytes, increasing creatinine, leukocytosis  Resolved Hospital Problem list     Assessment & Plan:   Acute Hypertensive Emergency  Initial BP 240/112, MAP 136  Echo moderate LVH -P.o. meds resumed and IV Cardene tapered to off-amlodipine 10 mg daily, coreg 25 mg BID, hydralazine 100 mg TID --SBP < 0000000  Acute Metabolic Encephalopathy , resolving Suspect in setting of hypertensive emergency, possible PRES. CT head negative. MRI negative -Precedex tapered off, seems more directable during the day than at night. Okay to use melatonin for sleep  Acute Hypoxic Respiratory Failure , resolved Pulmonary edema vs Aspiration  Tobacco Abuse -Reintubated 10/12 with increased WOB, not mobilizing secretions, stridor, received steroids, NO laryngeal edema noted -Extubated 10/14 with lingering stridor, improved  -DC steroids -empiric rocephin for aspiration/CAP x 5ds -Tobacco cessation counseling    AKI  10/2020 cr 1.7. Cr improved after gentle hydration 10/11. -Hold further Lasix -Replace electrolytes as  indicated -Avoid nephrotoxic agents as able, ensure adequate renal perfusion  DM with Hyperglycemia Not DKA, doubt HHS given glucose.  Suspect uncontrolled DM with stress response, worsened by steroids -Was SQ insulin levemir 55 u BID (120 u daily at home) , will resume Levemir 20 units twice daily - ct  SSI   Transfer to stepdown and to triad 10/17  Best Practice (right click and "Reselect all SmartList Selections" daily)  Diet/type: tubefeeds and dysphagia diet (see orders) DVT prophylaxis: prophylactic heparin  GI prophylaxis: PPI Lines: N/A Foley:  N/A Code Status:  full code Last date of multidisciplinary goals of care discussion: Full CODE STATUS  Labs   CBC: Recent Labs  Lab 09/20/21 0323 09/20/21 0330 09/21/21 0454 09/22/21 0448 09/22/21 1226 09/25/21 0316 09/26/21 0244  WBC 14.4*  --  23.2* 18.0*  --  21.1* 22.7*  NEUTROABS 10.9*  --   --   --   --   --   --   HGB 13.3   < > 13.1 13.0 11.2* 11.5* 10.7*  HCT 41.5   < > 39.2 40.1 33.0* 37.3 35.5*  MCV 90.2  --  87.5 89.7  --  93.3 94.7  PLT 227  --  221 164  --  216 266   < > = values in this interval not displayed.     Basic Metabolic Panel: Recent Labs  Lab 09/21/21 1622 09/22/21 0448 09/22/21 1226 09/22/21 1654 09/23/21 0159 09/24/21 0349 09/25/21 0316 09/26/21 0244  NA  --  142 142  --  142 145 147* 141  K  --  3.1* 3.5  --  3.8 4.0 4.1 4.4  CL  --  108  --   --  110 111 113* 111  CO2  --  24  --   --  '22 25 26 '$ 21*  GLUCOSE  --  184*  --   --  281* 131* 94 307*  BUN  --  36*  --   --  36* 40* 42* 55*  CREATININE  --  2.18*  --   --  2.02* 2.07* 2.19* 2.45*  CALCIUM  --  9.7  --   --  9.4 9.5 9.6 9.5  MG 1.8 1.9  --  2.0  --   --  2.4 2.3  PHOS 3.3 3.4  --  3.3  --   --  3.8 3.5    GFR: Estimated Creatinine Clearance: 32.5 mL/min (A) (by C-G formula based on SCr of 2.45 mg/dL (H)). Recent Labs  Lab 09/21/21 0454 09/22/21 0448 09/25/21 0316 09/26/21 0244  WBC 23.2* 18.0* 21.1* 22.7*      Liver Function Tests: Recent Labs  Lab 09/20/21 0323  AST 31  ALT 15  ALKPHOS 104  BILITOT 0.9  PROT 6.9  ALBUMIN 3.1*    No results for input(s): LIPASE, AMYLASE in the last 168 hours. Recent Labs  Lab 09/20/21 0644  AMMONIA 30     ABG    Component Value Date/Time   PHART 7.455 (H) 09/22/2021 1226   PCO2ART 36.5 09/22/2021 1226   PO2ART 254 (H) 09/22/2021 1226   HCO3 25.7 09/22/2021 1226  TCO2 27 09/22/2021 1226   O2SAT 100.0 09/22/2021 1226      Coagulation Profile: Recent Labs  Lab 09/20/21 0323  INR 0.9     Cardiac Enzymes: Recent Labs  Lab 09/20/21 0611  CKTOTAL 316*     HbA1C: Hgb A1c MFr Bld  Date/Time Value Ref Range Status  09/20/2021 12:02 PM 12.7 (H) 4.8 - 5.6 % Final    Comment:    (NOTE) Pre diabetes:          5.7%-6.4%  Diabetes:              >6.4%  Glycemic control for   <7.0% adults with diabetes     CBG: Recent Labs  Lab 09/25/21 1547 09/25/21 2029 09/25/21 2345 09/26/21 0424 09/26/21 [place] MD. FCCP. [facility] Pager : [phone]  If no response to pager , please call [phone] until 7 pm After 7:00 pm call Elink  [phone]   09/26/2021

## 2021-09-27 ENCOUNTER — Inpatient Hospital Stay (HOSPITAL_COMMUNITY): Payer: Medicare Other

## 2021-09-27 ENCOUNTER — Encounter (HOSPITAL_COMMUNITY): Payer: Self-pay | Admitting: Pulmonary Disease

## 2021-09-27 DIAGNOSIS — G9340 Encephalopathy, unspecified: Secondary | ICD-10-CM | POA: Diagnosis not present

## 2021-09-27 DIAGNOSIS — J9601 Acute respiratory failure with hypoxia: Secondary | ICD-10-CM | POA: Diagnosis not present

## 2021-09-27 DIAGNOSIS — J189 Pneumonia, unspecified organism: Secondary | ICD-10-CM | POA: Diagnosis not present

## 2021-09-27 DIAGNOSIS — R739 Hyperglycemia, unspecified: Secondary | ICD-10-CM

## 2021-09-27 LAB — MRSA NEXT GEN BY PCR, NASAL: MRSA by PCR Next Gen: NOT DETECTED

## 2021-09-27 LAB — BASIC METABOLIC PANEL
Anion gap: 5 (ref 5–15)
BUN: 55 mg/dL — ABNORMAL HIGH (ref 6–20)
CO2: 24 mmol/L (ref 22–32)
Calcium: 9.7 mg/dL (ref 8.9–10.3)
Chloride: 109 mmol/L (ref 98–111)
Creatinine, Ser: 1.99 mg/dL — ABNORMAL HIGH (ref 0.44–1.00)
GFR, Estimated: 29 mL/min — ABNORMAL LOW (ref >60)
Glucose, Bld: 138 mg/dL — ABNORMAL HIGH (ref 70–99)
Potassium: 4.1 mmol/L (ref 3.5–5.1)
Sodium: 138 mmol/L (ref 135–145)

## 2021-09-27 LAB — GLUCOSE, CAPILLARY
Glucose-Capillary: 138 mg/dL — ABNORMAL HIGH (ref 70–99)
Glucose-Capillary: 118 mg/dL — ABNORMAL HIGH (ref 70–99)
Glucose-Capillary: 148 mg/dL — ABNORMAL HIGH (ref 70–99)
Glucose-Capillary: 285 mg/dL — ABNORMAL HIGH (ref 70–99)
Glucose-Capillary: 381 mg/dL — ABNORMAL HIGH (ref 70–99)
Glucose-Capillary: 324 mg/dL — ABNORMAL HIGH (ref 70–99)

## 2021-09-27 LAB — POCT I-STAT 7, (LYTES, BLD GAS, ICA,H+H)
Acid-Base Excess: 0 mmol/L (ref 0.0–2.0)
Bicarbonate: 25.7 mmol/L (ref 20.0–28.0)
Calcium, Ion: 1.4 mmol/L (ref 1.15–1.40)
HCT: 31 % — ABNORMAL LOW (ref 36.0–46.0)
Hemoglobin: 10.5 g/dL — ABNORMAL LOW (ref 12.0–15.0)
O2 Saturation: 94 %
Patient temperature: 98.3
Potassium: 4.1 mmol/L (ref 3.5–5.1)
Sodium: 142 mmol/L (ref 135–145)
TCO2: 27 mmol/L (ref 22–32)
pCO2 arterial: 47 mmHg (ref 32.0–48.0)
pH, Arterial: 7.345 — ABNORMAL LOW (ref 7.350–7.450)
pO2, Arterial: 73 mmHg — ABNORMAL LOW (ref 83.0–108.0)

## 2021-09-27 LAB — CBC
HCT: 34.4 % — ABNORMAL LOW (ref 36.0–46.0)
Hemoglobin: 10.7 g/dL — ABNORMAL LOW (ref 12.0–15.0)
MCH: 28.5 pg (ref 26.0–34.0)
MCHC: 31.1 g/dL (ref 30.0–36.0)
MCV: 91.7 fL (ref 80.0–100.0)
Platelets: 303 10*3/uL (ref 150–400)
RBC: 3.75 MIL/uL — ABNORMAL LOW (ref 3.87–5.11)
RDW: 13.3 % (ref 11.5–15.5)
WBC: 21.9 10*3/uL — ABNORMAL HIGH (ref 4.0–10.5)
nRBC: 0 % (ref 0.0–0.2)

## 2021-09-27 LAB — MAGNESIUM: Magnesium: 2.2 mg/dL (ref 1.7–2.4)

## 2021-09-27 LAB — PHOSPHORUS: Phosphorus: 3.2 mg/dL (ref 2.5–4.6)

## 2021-09-27 MED ORDER — ENSURE ENLIVE PO LIQD
237.0000 mL | Freq: Two times a day (BID) | ORAL | Status: DC
  Administered 2021-09-27 – 2021-10-04 (×10): 237 mL via ORAL

## 2021-09-27 MED ORDER — DEXAMETHASONE SODIUM PHOSPHATE 4 MG/ML IJ SOLN: 4.0000 mg | Freq: Once | INTRAMUSCULAR | Status: DC

## 2021-09-27 MED ORDER — IPRATROPIUM-ALBUTEROL 0.5-2.5 (3) MG/3ML IN SOLN
3.0000 mL | RESPIRATORY_TRACT | Status: DC
  Administered 2021-09-27 – 2021-09-28 (×3): 3 mL via RESPIRATORY_TRACT
  Filled 2021-09-27 (×3): qty 3

## 2021-09-27 MED ORDER — QUETIAPINE FUMARATE 25 MG PO TABS
25.0000 mg | ORAL_TABLET | Freq: Every day | ORAL | Status: DC
  Administered 2021-09-27: 25 mg via ORAL
  Filled 2021-09-27 (×2): qty 1

## 2021-09-27 MED ORDER — INSULIN DETEMIR 100 UNIT/ML ~~LOC~~ SOLN
30.0000 [IU] | Freq: Two times a day (BID) | SUBCUTANEOUS | Status: DC
  Administered 2021-09-27 – 2021-09-28 (×3): 30 [IU] via SUBCUTANEOUS
  Filled 2021-09-27 (×4): qty 0.3

## 2021-09-27 MED ORDER — VANCOMYCIN HCL 1500 MG/300ML IV SOLN
1500.0000 mg | INTRAVENOUS | Status: DC
  Administered 2021-09-27: 1500 mg via INTRAVENOUS
  Filled 2021-09-27: qty 300

## 2021-09-27 MED ORDER — SODIUM CHLORIDE 0.9 % IV SOLN
2.0000 g | Freq: Two times a day (BID) | INTRAVENOUS | Status: DC
  Administered 2021-09-27 – 2021-10-02 (×11): 2 g via INTRAVENOUS
  Filled 2021-09-27 (×11): qty 2

## 2021-09-27 MED ORDER — DEXMEDETOMIDINE HCL IN NACL 400 MCG/100ML IV SOLN
0.4000 ug/kg/h | INTRAVENOUS | Status: DC
  Administered 2021-09-27: 0.4 ug/kg/h via INTRAVENOUS
  Administered 2021-09-28: 1 ug/kg/h via INTRAVENOUS
  Administered 2021-09-28: 0.4 ug/kg/h via INTRAVENOUS
  Filled 2021-09-27: qty 100
  Filled 2021-09-27: qty 200
  Filled 2021-09-27: qty 100

## 2021-09-27 MED ORDER — IPRATROPIUM-ALBUTEROL 0.5-2.5 (3) MG/3ML IN SOLN
3.0000 mL | RESPIRATORY_TRACT | Status: AC
  Administered 2021-09-27: 3 mL via RESPIRATORY_TRACT
  Filled 2021-09-27: qty 3

## 2021-09-27 MED ORDER — METHYLPREDNISOLONE SODIUM SUCC 125 MG IJ SOLR
125.0000 mg | Freq: Every day | INTRAMUSCULAR | Status: DC
  Administered 2021-09-27 – 2021-09-28 (×2): 125 mg via INTRAVENOUS
  Filled 2021-09-27 (×2): qty 2

## 2021-09-27 MED ORDER — LORAZEPAM 2 MG/ML IJ SOLN
1.0000 mg | Freq: Once | INTRAMUSCULAR | Status: AC | PRN
  Administered 2021-09-27: 1 mg via INTRAVENOUS

## 2021-09-27 MED ORDER — LORAZEPAM 2 MG/ML IJ SOLN
0.5000 mg | Freq: Once | INTRAMUSCULAR | Status: AC
  Administered 2021-09-27: 0.5 mg via INTRAVENOUS
  Filled 2021-09-27: qty 1

## 2021-09-27 MED ORDER — HYDRALAZINE HCL 20 MG/ML IJ SOLN
10.0000 mg | INTRAMUSCULAR | Status: DC | PRN
  Administered 2021-09-28 – 2021-10-27 (×26): 10 mg via INTRAVENOUS
  Filled 2021-09-27 (×26): qty 1

## 2021-09-27 MED ORDER — LORAZEPAM 2 MG/ML IJ SOLN
INTRAMUSCULAR | Status: AC
  Filled 2021-09-27: qty 1

## 2021-09-27 MED ORDER — ALBUTEROL SULFATE (2.5 MG/3ML) 0.083% IN NEBU
10.0000 mg/h | INHALATION_SOLUTION | RESPIRATORY_TRACT | Status: DC
  Filled 2021-09-27: qty 12

## 2021-09-27 MED ORDER — ALBUTEROL (5 MG/ML) CONTINUOUS INHALATION SOLN
10.0000 mg/h | INHALATION_SOLUTION | RESPIRATORY_TRACT | Status: AC
  Administered 2021-09-27: 10 mg/h via RESPIRATORY_TRACT
  Filled 2021-09-27: qty 0.5

## 2021-09-27 NOTE — Progress Notes (Signed)
PT Cancellation Note  Patient Details Name: Maureen Jones MRN: [id number] DOB: [birth year]   Cancelled Treatment:    Reason Eval/Treat Not Completed: Patient not medically ready. Pt transferred to ICU, on Bipap and precedex currently. RN asked to hold. PT to return as able, as appropriate.  Kittie Plater, PT, DPT Acute Rehabilitation Services Pager #: [phone] Office #: [phone]    Berline Lopes 09/27/2021, 11:09 AM

## 2021-09-27 NOTE — Progress Notes (Signed)
[place] Progress Note Patient Name: Maureen Jones DOB: 1966/06/29 MRN: [id number]   Date of Service  09/27/2021  HPI/Events of Note  Notified of agitation and hypertension with BP at 167/86.  Pt was started back on precedex gtt.   On camera assessment, pt is calmly resting and tolerating BIPAP.  BP 167/86, HR 83, RR 24, O2 sats 98%.  eICU Interventions  Will add hydralazine IV Prn for hypertension.      Intervention Category Intermediate Interventions: Hypertension - evaluation and management  Elsie Lincoln 09/27/2021, 10:26 PM

## 2021-09-27 NOTE — Progress Notes (Signed)
Patient remains agitated climbing out of bed no response from ativan.Call placed to MD. Critical care team to come assess patient.

## 2021-09-27 NOTE — Progress Notes (Signed)
Pt placed on BIPAP 14/7 per MD order. Pt is tolerating well at this time. RT to continue to monitor.

## 2021-09-27 NOTE — Progress Notes (Signed)
Pharmacy Antibiotic Note  Maureen Jones is a 55 y.o. female admitted on 09/20/2021 with hypertensive encephalopathy. Pt previously completed course of ceftriaxone. Pharmacy has been consulted for vancomycin and cefepime dosing with ongoing respiratory distress.  Plan: Vancomycin '1500mg'$  IV q48h - est AUC 486 Cefepime 2g IV q12h Follow Cr  Height: '5\' 6"'$  (167.6 cm) Weight: 105.5 kg (232 lb 9.4 oz) IBW/kg (Calculated) : 59.3  Temp (24hrs), Avg:98.5 F (36.9 C), Min:98.3 F (36.8 C), Max:98.8 F (37.1 C)  Recent Labs  Lab 09/21/21 0454 09/22/21 0448 09/23/21 0159 09/24/21 0349 09/25/21 0316 09/26/21 0244 09/27/21 0448  WBC 23.2* 18.0*  --   --  21.1* 22.7* 21.9*  CREATININE 2.50* 2.18* 2.02* 2.07* 2.19* 2.45* 1.99*    Estimated Creatinine Clearance: 39.7 mL/min (A) (by C-G formula based on SCr of 1.99 mg/dL (H)).    No Known Allergies  Antimicrobials this admission: Ceftriaxone 10/11 >> 10/15 Cefepime 10/17 >> Vancomycin 10/17 >>  Microbiology results: 10/10 BCx: negative 10/14 TA: negative  Thank you for allowing pharmacy to be a part of this patient's care.  Arrie Senate, PharmD, BCPS, [facility] Clinical Pharmacist [phone] Please check AMION for all [place] numbers 09/27/2021

## 2021-09-27 NOTE — Progress Notes (Signed)
NAMEKyomi Jones, MRN:  [id number], DOB:  26-Jan-1966, LOS: 7 ADMISSION DATE:  09/20/2021, CONSULTATION DATE: 10/10 REFERRING MD:  Dr. Laverta Baltimore, CHIEF COMPLAINT:  AMS   History of Present Illness:  55 y/o F who presented to [facility] on 10/10 with reports of acute altered mental status.   She was reportedly at home and went to smoke a cigarette 0200 with her nephew. She was apparently seen by her family at that time.  After smoking, the family heard a loud thud and she was found altered and confused.  On arrival to the ER she was noted to be agitated, confused, localized to pain and stated "that hurts" with stimulation.  She was seen initially as a CODE STROKE.  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 She was hypertensive with initial pressure of 240/112 with MAP of 136.  Initial labs showed glucose of 437, BUN 36, Sr CR 2.17, CK 316, ammonia 30, Mg 1.9, WBC 14.4. UDS was negative.  COVID & flu screening were negative.  CXR showed low lung volumes, ETT in good position, L>R interstitial opacities.    PCCM called for ICU admission.   Pertinent  Medical History  HTN DM   Significant Hospital Events: Including procedures, antibiotic start and stop dates in addition to other pertinent events   10/10 Admit with acute encephalopathy, hypertensive 10/12 extubated, increased WOB, re-intubated 10/14 extubated , lingering stridor, Received racemic epi x2 over the last 24 hours + another dose of Decadron, Mild agitation requiring restart of Precedex 10/15 swallow evaluation >> dysphagia 3 diet  Interim History / Subjective:  Moved to ICU last night for agitation. Concern for stridor and respiratory insufficiency causing her agitation.  Objective   Blood pressure (!) 162/78, pulse 74, temperature 98.4 F (36.9 C), temperature source Oral, resp. rate (!) 26, height '5\' 6"'$  (1.676 m), weight 105.5 kg, SpO2 94 %.        Intake/Output Summary  (Last 24 hours) at 09/27/2021 0736 Last data filed at 09/27/2021 0600 Gross per 24 hour  Intake 1240 ml  Output 1700 ml  Net -460 ml    Filed Weights   09/23/21 0500 09/24/21 0407 09/27/21 0400  Weight: 102.5 kg 107.2 kg 105.5 kg    Examination: General: ill appearing woman lying in bed in NAD, sleepy from sedatives given overnight HENT:  NA/AT, eyes anicteric Lungs: reduced L sided breath sounds which improved after suctioning, wheezing and rhonchi, expiratory accessory muscle use. Increased raspy breath sounds over her neck but no inspiratory stridor. Cardiovascular: S1S2, RRR Abdomen: obese, soft, NT Extremities: no peripheral edema, no cyanosis Neuro: RASS -3, moves extremities with stimulation, does not answer questions but mumbles.  Chest x-ray personally reviewed> cardiomegaly, persistent RML infiltrate.  ABG    Component Value Date/Time   PHART 7.345 (L) 09/27/2021 0724   PCO2ART 47.0 09/27/2021 0724   PO2ART 73 (L) 09/27/2021 0724   HCO3 25.7 09/27/2021 0724   TCO2 27 09/27/2021 0724   O2SAT 94.0 09/27/2021 0724   BUN 55 Cr 1.99 WBC 21.9  Resolved Hospital Problem list     Assessment & Plan:   Acute respiratory failure with hypoxia. Overnight there has been concern for subglottic stenosis, but her accessory breath sounds are more expiratory. CXR and elevated WBC are concerning for RML pneumonia.  Concern for obstructive lung disease, likely COPD with smoking history H/o tobacco abuse Previously intubated 2 times this admission, second time due  to stridor, but had no laryngeal edema noted during reintubation. Recently completed steroids. -reculture sputum -escalate antibiotics- vanc & cefepime. MRSA nasal swab -hour long neb -BiPAP to relieve WOB -NTS PRN -High risk for intubation. If subglottic stenosis is present, high risk for needing trach, potentially emergently. D/w RT and RN.  Acute Hypertensive Emergency > now just hypertensive Initial BP  240/112, MAP 136  Echo moderate LVH -P.o. meds resumed-amlodipine 10 mg daily, coreg 25 mg BID, hydralazine 100 mg TID --SBP < 0000000  Acute Metabolic Encephalopathy due to hypertensive emergency. Imaging negative for PRES. CT head negative. MRI negative. -previously required precedex -can assess what she needs once she is more awake and respiratory status is more stable  AKI, improving 10/2020 cr 1.7. Cr improved after gentle hydration 10/11. -holding additional diuresis -strict I/OS -Renally dose meds and avoid nephrotoxic  DM with uncontrolled hyperglycemia -PTA SQ insulin levemir 55 u BID (120 u daily at home) , increase today to 30 units BID with increasing steroids -SSI PRN -goal BG 140-180  Acute anemia due to critical illness -Transfuse for hemoglobin less than 7 or significant bleeding  Best Practice (right click and "Reselect all SmartList Selections" daily)  Diet/type: dysphagia diet (see orders) DVT prophylaxis: prophylactic heparin  GI prophylaxis: PPI Lines: N/A Foley:  N/A Code Status:  full code Last date of multidisciplinary goals of care discussion: Full CODE STATUS  Labs   CBC: Recent Labs  Lab 09/21/21 0454 09/22/21 0448 09/22/21 1226 09/25/21 0316 09/26/21 0244 09/27/21 0448 09/27/21 0724  WBC 23.2* 18.0*  --  21.1* 22.7* 21.9*  --   HGB 13.1 13.0 11.2* 11.5* 10.7* 10.7* 10.5*  HCT 39.2 40.1 33.0* 37.3 35.5* 34.4* 31.0*  MCV 87.5 89.7  --  93.3 94.7 91.7  --   PLT 221 164  --  216 266 303  --      Basic Metabolic Panel: Recent Labs  Lab 09/22/21 0448 09/22/21 1226 09/22/21 1654 09/23/21 0159 09/24/21 0349 09/25/21 0316 09/26/21 0244 09/27/21 0448 09/27/21 0724  NA 142   < >  --  142 145 147* 141 138 142  K 3.1*   < >  --  3.8 4.0 4.1 4.4 4.1 4.1  CL 108  --   --  110 111 113* 111 109  --   CO2 24  --   --  '22 25 26 '$ 21* 24  --   GLUCOSE 184*  --   --  281* 131* 94 307* 138*  --   BUN 36*  --   --  36* 40* 42* 55* 55*  --    CREATININE 2.18*  --   --  2.02* 2.07* 2.19* 2.45* 1.99*  --   CALCIUM 9.7  --   --  9.4 9.5 9.6 9.5 9.7  --   MG 1.9  --  2.0  --   --  2.4 2.3 2.2  --   PHOS 3.4  --  3.3  --   --  3.8 3.5 3.2  --    < > = values in this interval not displayed.    GFR: Estimated Creatinine Clearance: 39.7 mL/min (A) (by C-G formula based on SCr of 1.99 mg/dL (H)). Recent Labs  Lab 09/22/21 0448 09/25/21 0316 09/26/21 0244 09/27/21 0448  WBC 18.0* 21.1* 22.7* 21.9*     Liver Function Tests: No results for input(s): AST, ALT, ALKPHOS, BILITOT, PROT, ALBUMIN in the last 168 hours.  No results for input(s): LIPASE, AMYLASE  in the last 168 hours. No results for input(s): AMMONIA in the last 168 hours.   ABG    Component Value Date/Time   PHART 7.345 (L) 09/27/2021 0724   PCO2ART 47.0 09/27/2021 0724   PO2ART 73 (L) 09/27/2021 0724   HCO3 25.7 09/27/2021 0724   TCO2 27 09/27/2021 0724   O2SAT 94.0 09/27/2021 0724      This patient is critically ill with multiple organ system failure which requires frequent high complexity decision making, assessment, support, evaluation, and titration of therapies. This was completed through the application of advanced monitoring technologies and extensive interpretation of multiple databases. During this encounter critical care time was devoted to patient care services described in this note for 40 minutes.  Julian Hy, DO 09/27/21 8:05 AM [facility]

## 2021-09-27 NOTE — Progress Notes (Signed)
Pt removed from CPAP and placed on 6L [place] due to pt wanting to eat. Pt is tolerating well at this time.

## 2021-09-27 NOTE — Progress Notes (Signed)
Called to bedside for ongoing increasing agitation.  Not responding significantly.  Still trying to crawl out of bed.  She is rhonchorus B and some possible stridor.  Not cooperating for breathing treatment or racemic epi.  Per nurses and RT, she has been agitated all night, worsening.   Nurses have not been able to leave the bedside.   Will have to move her back to the ICU at this time for precedex infusion, airway monitoring.

## 2021-09-27 NOTE — Progress Notes (Signed)
Patient remains agitated seroquel did not help.Patient oxygen saturations remain 94-97% on room air.Call placed to MD.

## 2021-09-27 NOTE — Progress Notes (Signed)
[place] Progress Note Patient Name: Maureen Jones DOB: [birth year] MRN: [id number]   Date of Service  09/27/2021  HPI/Events of Note  still extremely agitated. Charge nurse saying that she is unable to leave room at all. Seroquil did not help.  eICU Interventions  Ativan 0.5 mg IV once for now with fall.asp precautions.  Discussed with Agricultural consultant.      Intervention Category Intermediate Interventions: Other:  Elmer Sow 09/27/2021, 5:29 AM

## 2021-09-27 NOTE — Progress Notes (Signed)
Rapid response called to come assess patient.

## 2021-09-27 NOTE — Progress Notes (Signed)
Patient has become more restless and agitated as the night has progressed.Patient has pulled off telemetry ,oxygen saturation monitor ,and IV during the night.Patient also getting out of bed very unsteady on her feet.Patient blood pressure elevated 170-180's no prn medicine ordered.Call placed to Dr. Prudencio Burly.

## 2021-09-27 NOTE — Progress Notes (Signed)
[place] Progress Note Patient Name: Maureen Jones   Date of Service  09/27/2021  HPI/Events of Note  was transferred to PCU from ICU yesterday. all night agitated, tried seroquil, ativan, charge RN says do not have staff to be there at bed side. almost fell once. Dx PRES-encephalopathy, was on precedex in ICU.   Now back in ICU for ? Stridor. Could be aspiration?.  Camera: VS stable, on NRB. Just rolled in. Confused.   Data: Reviewed    eICU Interventions  - to go on precedex gtt - might need intubation if not able to protect airways. - asp precautions. - continue other care for PRES.      Intervention Category Major Interventions: Respiratory failure - evaluation and management;Other:;Delirium, psychosis, severe agitation - evaluation and management Evaluation Type: New Patient Evaluation  Elmer Sow 09/27/2021, 6:54 AM

## 2021-09-27 NOTE — Progress Notes (Signed)
Nutrition Follow-up  DOCUMENTATION CODES:   Obesity unspecified  INTERVENTION:   Ensure Enlive po BID, each supplement provides 350 kcal and 20 grams of protein   NUTRITION DIAGNOSIS:   Inadequate oral intake related to inability to eat as evidenced by NPO status.  Being addressed via TF   GOAL:   Patient will meet greater than or equal to 90% of their needs  Progressing  MONITOR:   TF tolerance, Diet advancement  REASON FOR ASSESSMENT:   Consult, Ventilator Enteral/tube feeding initiation and management  ASSESSMENT:   Pt with PMH of DM, HTN, and smoking admitted with acute encephalopathy.  10/12 Extubated, Re-Intubated 10/14 Extubated 10/15 Diet advanced to Dysphagia 3  Agitated over night, transferred to ICU. Currently on BiPap, on precedex drip. High risk for reintubation  Recorded po intake on 10/15 and 10/16 100% of meals  Labs: CBGs 118-309 Meds: colace, ss novolog, levemir, solumedrol  Diet Order:   Diet Order             Diet heart healthy/carb modified Room service appropriate? Yes; Fluid consistency: Thin  Diet effective now                   EDUCATION NEEDS:   Not appropriate for education at this time  Skin:  Skin Assessment: Reviewed RN Assessment  Last BM:  10/16  Height:   Ht Readings from Last 1 Encounters:  09/22/21 '5\' 6"'$  (1.676 m)    Weight:   Wt Readings from Last 1 Encounters:  09/27/21 105.5 kg     BMI:  Body mass index is 37.54 kg/m.  Estimated Nutritional Needs:   Kcal:  1700-1900  Protein:  90-110 grams  Fluid:  > 1.7 L/day   Kerman Passey MS, RDN, LDN, CNSC Registered Dietitian III Clinical Nutrition RD Pager and On-Call Pager Number Located in [place]

## 2021-09-27 NOTE — Significant Event (Signed)
Rapid Response Event Note   Reason for Call :  Increasing agitation t/o night, now SOB  Initial Focused Assessment:  Pt rolling around bed, confused, agitated. She is oriented to self only. She is able to answer a few questions, though unable to speak in complete sentences d/t SOB. Pt says she can't breath. Breathing is labored and stridorous. Lungs with rhonchi. Stridor present. Skin cool to touch.  HR-74, BP-162/78, RR-30s, SpO2-92% on 4L [place].  Pt received '25mg'$  seroquel at 0410 and 0.'5mg'$  ativan at 0539 d/t agitation.  PCCM MD and PA came to bedside to assess pt. '1mg'$  ativan, Rac. Epi ordered as well as precedex gtt and tx back to ICU for closer monitoring.   RT attempted to give pt Rac. Epi however pt would not leave mask on.   Shortly after '1mg'$  ativan given, pt became less responsive and SpO2 dropped to mid 80s. Pt placed on NRB mask with SpO2 increasing to 95%.   Interventions:  '1mg'$  ativan IV Rac Epi-pt did not receive until ICU d/t agitation Precedex gtt Tx to [place] of Care:  Tx to 2H10   Event Summary:   MD Notified: Dr. Duwayne Heck and Mickel Baas, PCCM PA notified and came to beside Call [place] Time:0700  Dillard Essex, RN

## 2021-09-27 NOTE — Progress Notes (Signed)
[facility] Progress Note Patient Name: Maureen Jones DOB: 03/07/1966 MRN: [id number]   Date of Service  09/27/2021  HPI/Events of Note  agitated and continuosly pulling out ivs and telemetry monitoring. been attempting Out of bed and had near miss fall.  hypetensive too. Goal SBP < 160  Extubated on 14 th. On 2 lit nasal o2  Possible PRES encephalopathy as per notes.AKI. was on precedex in  ICU.  Got melatonin at bed time.  eICU Interventions  - Start Seroquil low dose 25 mg at night for 3 days, fall/asp and sz precautions. In progressive unit.  Discussed with RN.      Intervention Category Intermediate Interventions: Other:;Hypertension - evaluation and management  Elmer Sow 09/27/2021, 3:45 AM

## 2021-09-27 NOTE — Progress Notes (Signed)
Tracheal aspirate collected and sent to lab. 

## 2021-09-28 DIAGNOSIS — J4541 Moderate persistent asthma with (acute) exacerbation: Secondary | ICD-10-CM | POA: Diagnosis not present

## 2021-09-28 DIAGNOSIS — R490 Dysphonia: Secondary | ICD-10-CM

## 2021-09-28 DIAGNOSIS — G9340 Encephalopathy, unspecified: Secondary | ICD-10-CM | POA: Diagnosis not present

## 2021-09-28 DIAGNOSIS — I152 Hypertension secondary to endocrine disorders: Secondary | ICD-10-CM

## 2021-09-28 DIAGNOSIS — E1159 Type 2 diabetes mellitus with other circulatory complications: Secondary | ICD-10-CM

## 2021-09-28 DIAGNOSIS — J9601 Acute respiratory failure with hypoxia: Secondary | ICD-10-CM | POA: Diagnosis not present

## 2021-09-28 LAB — GLUCOSE, CAPILLARY
Glucose-Capillary: 161 mg/dL — ABNORMAL HIGH (ref 70–99)
Glucose-Capillary: 145 mg/dL — ABNORMAL HIGH (ref 70–99)
Glucose-Capillary: 166 mg/dL — ABNORMAL HIGH (ref 70–99)
Glucose-Capillary: 213 mg/dL — ABNORMAL HIGH (ref 70–99)
Glucose-Capillary: 349 mg/dL — ABNORMAL HIGH (ref 70–99)

## 2021-09-28 MED ORDER — PHENOL 1.4 % MT LIQD
1.0000 | OROMUCOSAL | Status: DC | PRN
  Administered 2021-10-03 – 2021-10-04 (×3): 1 via OROMUCOSAL
  Filled 2021-09-28: qty 177

## 2021-09-28 MED ORDER — IPRATROPIUM-ALBUTEROL 0.5-2.5 (3) MG/3ML IN SOLN
3.0000 mL | RESPIRATORY_TRACT | Status: DC | PRN
  Administered 2021-09-28 – 2021-09-30 (×6): 3 mL via RESPIRATORY_TRACT
  Filled 2021-09-28 (×6): qty 3

## 2021-09-28 MED ORDER — LOSARTAN POTASSIUM 25 MG PO TABS
25.0000 mg | ORAL_TABLET | Freq: Every day | ORAL | Status: DC
  Administered 2021-09-28 – 2021-09-29 (×2): 25 mg via ORAL
  Filled 2021-09-28 (×2): qty 1

## 2021-09-28 MED ORDER — HALOPERIDOL LACTATE 5 MG/ML IJ SOLN
2.0000 mg | Freq: Four times a day (QID) | INTRAMUSCULAR | Status: DC | PRN
  Administered 2021-09-30 – 2021-10-05 (×4): 2 mg via INTRAVENOUS
  Filled 2021-09-28 (×6): qty 1

## 2021-09-28 MED ORDER — MENTHOL 3 MG MT LOZG
1.0000 | LOZENGE | OROMUCOSAL | Status: DC | PRN
  Administered 2021-09-28 (×3): 3 mg via ORAL
  Filled 2021-09-28: qty 9

## 2021-09-28 MED ORDER — QUETIAPINE FUMARATE 50 MG PO TABS
50.0000 mg | ORAL_TABLET | Freq: Every day | ORAL | Status: DC
  Administered 2021-09-28 – 2021-10-04 (×7): 50 mg via ORAL
  Filled 2021-09-28 (×7): qty 1

## 2021-09-28 MED ORDER — INSULIN DETEMIR 100 UNIT/ML ~~LOC~~ SOLN
25.0000 [IU] | Freq: Two times a day (BID) | SUBCUTANEOUS | Status: DC
  Administered 2021-09-28 – 2021-09-30 (×4): 25 [IU] via SUBCUTANEOUS
  Filled 2021-09-28 (×7): qty 0.25

## 2021-09-28 MED ORDER — PREDNISONE 20 MG PO TABS
40.0000 mg | ORAL_TABLET | Freq: Every day | ORAL | Status: DC
  Administered 2021-09-29: 40 mg via ORAL
  Filled 2021-09-28: qty 2

## 2021-09-28 MED ORDER — FLUTICASONE FUROATE-VILANTEROL 100-25 MCG/INH IN AEPB
1.0000 | INHALATION_SPRAY | Freq: Every day | RESPIRATORY_TRACT | Status: DC
  Administered 2021-09-28 – 2021-09-29 (×2): 1 via RESPIRATORY_TRACT
  Filled 2021-09-28: qty 28

## 2021-09-28 NOTE — Progress Notes (Addendum)
Physical Therapy Treatment Patient Details Name: Maureen Jones MRN: [id number] DOB: 1966/02/23 Today's Date: 09/28/2021   History of Present Illness Pt is a 55 y.o. female admitted 09/20/21 with AMS. 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 CXR showed low lung volumes, L>R interstitial opacities. Workup for acute metabolic encephalopathy in setting of hypertensive urgency, possible PRES. ETT 10/10-10/12, reintubated 10/12-10/14. Course complicated by confusion/agitation overnight 10/17. PMH includes HTN, DM.   PT Comments    Pt progressing with mobility. Today's session focused on transfer and gait training without DME, pt requiring HHA and intermittent minA to prevent LOB. Pt remains limited by generalized weakness, decreased activity tolerance, poor balance strategies, and impaired cognition, including decreased safety awareness, poor problem solving. Recommend 24/7 supervision available at discharge due to fall risk and cognitive impairments, as well as HHPT services to maximize functional mobility and independence. Will continue to follow acutely to address established goals.  SpO2 >/93% on 3L with activity HR 60s-130s  Orthostatic BPs Sitting 161/76  Post-standing ADL tasks 140/69  Post-hallway ambulation 157/52     Recommendations for follow up therapy are one component of a multi-disciplinary discharge planning process, led by the attending physician.  Recommendations may be updated based on patient status, additional functional criteria and insurance authorization.  Follow Up Recommendations  Home health PT;Supervision/Assistance - 24 hour     Equipment Recommendations  TBD - RW, 3in1   Recommendations for Other Services       Precautions / Restrictions Precautions Precautions: Fall;Other (comment) Precaution Comments: goal SBP < 160 Restrictions Weight Bearing Restrictions: No     Mobility  Bed Mobility Overal bed mobility: Needs  Assistance Bed Mobility: Supine to Sit     Supine to sit: Supervision     General bed mobility comments: Supervision for safety/lines    Transfers Overall transfer level: Needs assistance Equipment used: 1 person hand held assist;None Transfers: Sit to/from Stand Sit to Stand: Min assist         General transfer comment: Multiple sit<>stands from EOB, low toilet height and recliner, min guard to minA for stability  Ambulation/Gait Ambulation/Gait assistance: Min guard;Min assist;+2 safety/equipment Gait Distance (Feet): 210 Feet Assistive device: 1 person hand held assist Gait Pattern/deviations: Step-through pattern;Decreased stride length Gait velocity: Decreased   General Gait Details: Slow, mildly unsteady gait with HHA and intermittent minA for balance; pt with bouts of instability, at times seem related to spikes in HR (up to 130s), requiring 3x standing rest breaks; pt endorses fatigue and SOB   Stairs             Wheelchair Mobility    Modified Rankin (Stroke Patients Only)       Balance Overall balance assessment: Needs assistance Sitting-balance support: Single extremity supported;Feet supported;No upper extremity supported Sitting balance-[name]: Fair     Standing balance support: No upper extremity supported;Single extremity supported Standing balance-[name]: Fair Standing balance comment: Able to stand without UE support but needs minA due to posterior trunk sway and impulsivity                            Cognition Arousal/Alertness: Awake/alert Behavior During Therapy: WFL for tasks assessed/performed;Impulsive Overall Cognitive Status: No family/caregiver present to determine baseline cognitive functioning Area of Impairment: Attention;Following commands;Awareness;Problem solving;Safety/judgement;Memory                   Current Attention Level: Selective Memory: Decreased short-term memory Following  Commands: Follows one step commands consistently;Follows one step commands with increased time Safety/Judgement: Decreased awareness of deficits;Decreased awareness of safety Awareness: Emergent Problem Solving: Slow processing;Difficulty sequencing;Requires verbal cues General Comments: Pt overall very pleasant and coopertive throughout session. Intermittent cues for completing mobility tasks, decreased safety awareness and insight into deficits      Exercises      General Comments General comments (skin integrity, edema, etc.): Pt complaining of dizziness "I just feel disoriented" throughout session; siting BP 161/76, post-ambulation BP 157/52; SpO2 >/93% on 3L with activity; DOE 2/4. Pt asking "why is this happening to me?"      Pertinent Vitals/Pain Pain Assessment: Faces Faces Pain Scale: Hurts a little bit Pain Location: throat Pain Descriptors / Indicators: Tender Pain Intervention(s): Monitored during session    Home Living                      Prior Function            PT Goals (current goals can now be found in the care plan section) Acute Rehab PT Goals Patient Stated Goal: to feel better Progress towards PT goals: Progressing toward goals    Frequency    Min 3X/week      PT Plan Current plan remains appropriate    Co-evaluation   Reason for Co-Treatment: Necessary to address cognition/behavior during functional activity;For patient/therapist safety;To address functional/ADL transfers (decline resulting in transfer to ICU, Pt with safety sitter) PT goals addressed during session: Mobility/safety with mobility;Balance OT goals addressed during session: ADL's and self-care      AM-PAC PT "6 Clicks" Mobility   Outcome Measure  Help needed turning from your back to your side while in a flat bed without using bedrails?: None Help needed moving from lying on your back to sitting on the side of a flat bed without using bedrails?: A Little Help  needed moving to and from a bed to a chair (including a wheelchair)?: A Little Help needed standing up from a chair using your arms (e.g., wheelchair or bedside chair)?: A Little Help needed to walk in hospital room?: A Little Help needed climbing 3-5 steps with a railing? : A Little 6 Click Score: 19    End of Session Equipment Utilized During Treatment: Gait belt Activity Tolerance: Patient tolerated treatment well;Patient limited by fatigue Patient left: in chair;with call bell/phone within reach;with chair alarm set;with nursing/sitter in room Nurse Communication: Mobility status PT Visit Diagnosis: Unsteadiness on feet (R26.81);Other abnormalities of gait and mobility (R26.89);Difficulty in walking, not elsewhere classified (R26.2)     Time: [id number] PT Time Calculation (min) (ACUTE ONLY): 32 min  Charges:  $Gait Training: 8-22 mins                     Mabeline Caras, PT, DPT Acute Rehabilitation Services  Pager [phone] Office [place] 09/28/2021, 12:33 PM

## 2021-09-28 NOTE — Progress Notes (Signed)
[facility] Progress Note Patient Name: Namiko Steedman DOB: [birth year] MRN: [id number]   Date of Service  09/28/2021  HPI/Events of Note  Received request for safety sitter.  Pt is on precedex gtt but is confused and tries to get out of bed and RN is concerned for patient safety.  eICU Interventions  Safety sitter ordered.     Intervention Category Minor Interventions: Other:  Elsie Lincoln 09/28/2021, 4:26 AM

## 2021-09-28 NOTE — Progress Notes (Signed)
Patient unable to tolerate NIV during the night. Frequently pulling the mask off and states that she does not want to wear it. Positive reinforcement given to patient numerous times regarding the NIV benefits and the profound necessity of the utilization of the NIV mask due to her frequent pauses of respirations and irregular breathing pattern while at rest/sleep. RN is aware and notified regarding patient inability to tolerate or willingness to wear the CPAP mask qhs. Patient is on a nasal cannula at this time.   Maureen Jones, BS, RRT-ACCS, RCP

## 2021-09-28 NOTE — Progress Notes (Signed)
Pt does not want to wear BiPAP but will agree to wearing the CPAP tonight.

## 2021-09-28 NOTE — Progress Notes (Signed)
Occupational Therapy Treatment Patient Details Name: Maureen Jones MRN: [id number] DOB: 30-Jan-1966 Today's Date: 09/28/2021   History of present illness 55 year old female admitted with AMS 10/10, intubated, stroke w/u negative. Diagnosed with acute metabolic encephalopathy in the setting of hypertensive emergency, possible PRES. Extubated but reintubated 10/12 with increased WOB. Extubated 10/14 with lingering stridor, improving 10/15 am.  PMH includes: HTN, DM.   OT comments  Pt progressing towards OT goals this session. Cognition seems slightly improved - no hallucinations but decreased safety awareness continues along with STM deficits and impulsivity. Pt also had multiple "spikes" in HR throughout session up to 132 from the 90's. She was able to complete toilet transfer at min A with HHA (frequently reaching into environment for external support) peri care at min guard, standing grooming at min guard (leaning against sink for balance) and min A for task completion and aspects of sequencing/problem solving. OT will continue to follow acutely- POC remains appropriate.  Of note: sister is a traveling Theme park manager and is away in [place]. Pt states that her aunt/uncle can assist at [place]. I am very concerned about cognitive activities like medicine management and believe that this patient does currently present as a very high fall risk.    Recommendations for follow up therapy are one component of a multi-disciplinary discharge planning process, led by the attending physician.  Recommendations may be updated based on patient status, additional functional criteria and insurance authorization.    Follow Up Recommendations  Home health OT;Supervision/Assistance - 24 hour    Equipment Recommendations  3 in 1 bedside commode    Recommendations for Other Services Speech consult    Precautions / Restrictions Precautions Precautions: Fall Precaution Comments: SBP<160 Restrictions Weight Bearing  Restrictions: No       Mobility Bed Mobility Overal bed mobility: Needs Assistance Bed Mobility: Supine to Sit     Supine to sit: Min guard     General bed mobility comments: for safety as pt is quick and impulsive with movements.    Transfers Overall transfer level: Needs assistance Equipment used: 1 person hand held assist;None Transfers: Sit to/from Stand Sit to Stand: Min assist;+2 safety/equipment         General transfer comment: for safety and balance, posterior bias with transfers requiring min assist to correct. Quick to power up.    Balance Overall balance assessment: Needs assistance Sitting-balance support: Single extremity supported;Feet supported Sitting balance-[name]: Fair     Standing balance support: No upper extremity supported;Single extremity supported Standing balance-[name]: Fair Standing balance comment: Able to stand without UE support but needs minA due to posterior trunk sway and impulsivity                           ADL either performed or assessed with clinical judgement   ADL Overall ADL's : Needs assistance/impaired     Grooming: Wash/dry hands;Wash/dry face;Oral care;Min guard;Standing Grooming Details (indicate cue type and reason): at sink                 Toilet Transfer: Minimal assistance;+2 for safety/equipment;Ambulation Toilet Transfer Details (indicate cue type and reason): into bathroom Toileting- Clothing Manipulation and Hygiene: Min guard;Sitting/lateral lean;Sit to/from stand Toileting - Clothing Manipulation Details (indicate cue type and reason): with paper and warm wash cloth     Functional mobility during ADLs: Minimal assistance;+2 for safety/equipment;Cueing for sequencing;Cueing for safety General ADL Comments: Pt with multiple LOB requring external assist  Vision       Perception     Praxis      Cognition Arousal/Alertness: Awake/alert Behavior During Therapy:  Impulsive Overall Cognitive Status: No family/caregiver present to determine baseline cognitive functioning Area of Impairment: Attention;Following commands;Awareness;Problem solving;Safety/judgement;Memory                   Current Attention Level: Selective Memory: Decreased short-term memory Following Commands: Follows one step commands consistently;Follows one step commands with increased time Safety/Judgement: Decreased awareness of deficits;Decreased awareness of safety Awareness: Emergent Problem Solving: Slow processing;Difficulty sequencing;Requires verbal cues General Comments: Pt overall very pleasant and coopertive throughout session. She was able to complete standing and sitting ADL tasks with min cues. with decreased awareness for safety.        Exercises     Shoulder Instructions       General Comments Pt complaining of dizziness "I just feel disoriented" throughout session. BP WFL, SpO2 >90% on 3L with activity. Pt was DOE 2/4 with activity. Pt asking "why is this happening to me?"    Pertinent Vitals/ Pain       Pain Assessment: Faces Faces Pain Scale: Hurts a little bit Pain Location: throat Pain Descriptors / Indicators: Tender Pain Intervention(s): Monitored during session (hot drink at EOS)  Home Living                                          Prior Functioning/Environment              Frequency  Min 2X/week        Progress Toward Goals  OT Goals(current goals can now be found in the care plan section)  Progress towards OT goals: Progressing toward goals  Acute Rehab OT Goals Patient Stated Goal: to feel better OT Goal Formulation: With patient Time For Goal Achievement: 10/09/21 Potential to Achieve Goals: Good  Plan Discharge plan remains appropriate    Co-evaluation    PT/OT/SLP Co-Evaluation/Treatment: Yes Reason for Co-Treatment: Necessary to address cognition/behavior during functional activity;For  patient/therapist safety;To address functional/ADL transfers (decline resulting in transfer to ICU, Pt with safety sitter) PT goals addressed during session: Mobility/safety with mobility;Balance OT goals addressed during session: ADL's and self-care      AM-PAC OT "6 Clicks" Daily Activity     Outcome Measure   Help from another person eating meals?: A Little Help from another person taking care of personal grooming?: A Little Help from another person toileting, which includes using toliet, bedpan, or urinal?: A Little Help from another person bathing (including washing, rinsing, drying)?: A Little Help from another person to put on and taking off regular upper body clothing?: A Little Help from another person to put on and taking off regular lower body clothing?: A Little 6 Click Score: 18    End of Session Equipment Utilized During Treatment: Gait belt;Oxygen (3L)  OT Visit Diagnosis: Other abnormalities of gait and mobility (R26.89);Other symptoms and signs involving cognitive function   Activity Tolerance Patient tolerated treatment well   Patient Left in chair;with call bell/phone within reach;with chair alarm set;with nursing/sitter in room   Nurse Communication Mobility status        Time: [id number] OT Time Calculation (min): 34 min  Charges: OT General Charges $OT Visit: 1 Visit OT Treatments $Self Care/Home Management : 8-22 mins  [place] Pager: [phone] Office: [phone]  [place] 09/28/2021, 11:10 AM

## 2021-09-28 NOTE — Progress Notes (Signed)
Inpatient Diabetes Program Recommendations  AACE/ADA: New Consensus Statement on Inpatient Glycemic Control (2015)  Target Ranges:  Prepandial:   less than 140 mg/dL      Peak postprandial:   less than 180 mg/dL (1-2 hours)      Critically ill patients:  140 - 180 mg/dL   Lab Results  Component Value Date   GLUCAP 166 (H) 09/28/2021   HGBA1C 12.7 (H) 09/20/2021    Review of Glycemic Control Results for Maureen Jones, VARS (MRN [id number]) as of 09/28/2021 12:24  Ref. Range 09/27/2021 08:43 09/27/2021 11:32 09/27/2021 16:10 09/27/2021 19:57 09/27/2021 23:10 09/28/2021 03:27 09/28/2021 07:52 09/28/2021 11:08  Glucose-Capillary Latest Ref Range: 70 - 99 mg/dL 118 (H) 148 (H) 285 (H) 381 (H) 324 (H) 161 (H) 145 (H) 166 (H)   Diabetes history: DM 2 Outpatient Diabetes medications:  Tresiba 120 units daily Current orders for Inpatient glycemic control:  Novolog moderate q 4 hours Levemir 25 units bid  Inpatient Diabetes Program Recommendations:    Consider adding Novolog 4 units tid with meals (hold if patient eats less than 50% or NPO).    Thanks,  Adah Perl, RN, BC-ADM Inpatient Diabetes Coordinator Pager [phone]  (8a-5p)

## 2021-09-28 NOTE — Consult Note (Signed)
Reason for Consult: Hoarseness, sore throat/difficulty swallowing Referring Physician: Julian Hy, DO  Maureen Jones is an 55 y.o. female.  HPI: Patient was admitted on October 10 with altered mental status, intubated.  She was extubated on October 12 and then reintubated due to respiratory distress.  She was extubated again on the 14th and since then has had hoarseness.  She complains of difficulty swallowing and pain on swallowing.  She is a chronic smoker.  She has never had throat problems before.  Past Medical History:  Diagnosis Date   Diabetes ([place])    Hypertension     History reviewed. No pertinent surgical history.  No family history on file.  Social History:  reports that she has been smoking cigarettes. She does not have any smokeless tobacco history on file. No history on file for alcohol use and drug use.  Allergies: No Known Allergies  Medications: Reviewed  Results for orders placed or performed during the hospital encounter of 09/20/21 (from the past 48 hour(s))  Glucose, capillary     Status: Abnormal   Collection Time: 09/26/21  4:47 PM  Result Value Ref Range   Glucose-Capillary 305 (H) 70 - 99 mg/dL    Comment: Glucose reference range applies only to samples taken after fasting for at least 8 hours.  Glucose, capillary     Status: Abnormal   Collection Time: 09/26/21  8:42 PM  Result Value Ref Range   Glucose-Capillary 236 (H) 70 - 99 mg/dL    Comment: Glucose reference range applies only to samples taken after fasting for at least 8 hours.  Glucose, capillary     Status: Abnormal   Collection Time: 09/26/21 11:10 PM  Result Value Ref Range   Glucose-Capillary 244 (H) 70 - 99 mg/dL    Comment: Glucose reference range applies only to samples taken after fasting for at least 8 hours.  Glucose, capillary     Status: Abnormal   Collection Time: 09/27/21  3:41 AM  Result Value Ref Range   Glucose-Capillary 138 (H) 70 - 99 mg/dL    Comment: Glucose  reference range applies only to samples taken after fasting for at least 8 hours.  CBC     Status: Abnormal   Collection Time: 09/27/21  4:48 AM  Result Value Ref Range   WBC 21.9 (H) 4.0 - 10.5 K/uL   RBC 3.75 (L) 3.87 - 5.11 MIL/uL   Hemoglobin 10.7 (L) 12.0 - 15.0 g/dL   HCT 34.4 (L) 36.0 - 46.0 %   MCV 91.7 80.0 - 100.0 fL   MCH 28.5 26.0 - 34.0 pg   MCHC 31.1 30.0 - 36.0 g/dL   RDW 13.3 11.5 - 15.5 %   Platelets 303 150 - 400 K/uL   nRBC 0.0 0.0 - 0.2 %    Comment: Performed at [facility], [place] [address], [place], [place] [id number]  Basic metabolic panel     Status: Abnormal   Collection Time: 09/27/21  4:48 AM  Result Value Ref Range   Sodium 138 135 - 145 mmol/L   Potassium 4.1 3.5 - 5.1 mmol/L   Chloride 109 98 - 111 mmol/L   CO2 24 22 - 32 mmol/L   Glucose, Bld 138 (H) 70 - 99 mg/dL    Comment: Glucose reference range applies only to samples taken after fasting for at least 8 hours.   BUN 55 (H) 6 - 20 mg/dL   Creatinine, Ser 1.99 (H) 0.44 - 1.00 mg/dL  Calcium 9.7 8.9 - 10.3 mg/dL   GFR, Estimated 29 (L) >60 mL/min    Comment: (NOTE) Calculated using the CKD-EPI Creatinine Equation (2021)    Anion gap 5 5 - 15    Comment: Performed at [facility], [place] [address] [place], [place] [address]  Magnesium     Status: None   Collection Time: 09/27/21  4:48 AM  Result Value Ref Range   Magnesium 2.2 1.7 - 2.4 mg/dL    Comment: Performed at [place] [address], [place], [place] [address]  Phosphorus     Status: None   Collection Time: 09/27/21  4:48 AM  Result Value Ref Range   Phosphorus 3.2 2.5 - 4.6 mg/dL    Comment: Performed at [place] [address] [place], [place] [address]  I-STAT 7, (LYTES, BLD GAS, ICA, H+H)     Status: Abnormal   Collection Time: 09/27/21  7:24 AM  Result Value Ref Range   pH, Arterial 7.345 (L) 7.350 - 7.450   pCO2 arterial 47.0 32.0 - 48.0 mmHg   pO2, Arterial 73 (L) 83.0 - 108.0 mmHg    Bicarbonate 25.7 20.0 - 28.0 mmol/L   TCO2 27 22 - 32 mmol/L   O2 Saturation 94.0 %   Acid-Base Excess 0.0 0.0 - 2.0 mmol/L   Sodium 142 135 - 145 mmol/L   Potassium 4.1 3.5 - 5.1 mmol/L   Calcium, Ion 1.40 1.15 - 1.40 mmol/L   HCT 31.0 (L) 36.0 - 46.0 %   Hemoglobin 10.5 (L) 12.0 - 15.0 g/dL   Patient temperature 98.3 F    Collection site Radial    Drawn by RT    Sample type ARTERIAL   MRSA Next Gen by PCR, Nasal     Status: None   Collection Time: 09/27/21  7:50 AM   Specimen: Nasal Mucosa; Nasal Swab  Result Value Ref Range   MRSA by PCR Next Gen NOT DETECTED NOT DETECTED    Comment: (NOTE) The GeneXpert MRSA Assay (FDA approved for NASAL specimens only), is one component of a comprehensive MRSA colonization surveillance program. It is not intended to diagnose MRSA infection nor to guide or monitor treatment for MRSA infections. Test performance is not FDA approved in patients less than 46 years old. Performed at [facility], [place] [address] [place], [place] [address]   Glucose, capillary     Status: Abnormal   Collection Time: 09/27/21  8:43 AM  Result Value Ref Range   Glucose-Capillary 118 (H) 70 - 99 mg/dL    Comment: Glucose reference range applies only to samples taken after fasting for at least 8 hours.  Glucose, capillary     Status: Abnormal   Collection Time: 09/27/21 11:32 AM  Result Value Ref Range   Glucose-Capillary 148 (H) 70 - 99 mg/dL    Comment: Glucose reference range applies only to samples taken after fasting for at least 8 hours.  Culture, Respiratory w Gram Stain     Status: None (Preliminary result)   Collection Time: 09/27/21  2:22 PM   Specimen: Tracheal Aspirate; Respiratory  Result Value Ref Range   Specimen Description TRACHEAL ASPIRATE    Special Requests NONE    Gram Stain      ABUNDANT WBC PRESENT,BOTH PMN AND MONONUCLEAR ABUNDANT GRAM NEGATIVE RODS MODERATE GRAM POSITIVE COCCI    Culture      CULTURE REINCUBATED FOR BETTER  GROWTH Performed at [facility]  Lab, [address] [place], [place] [address]    Report Status PENDING   Glucose, capillary     Status: Abnormal   Collection Time: 09/27/21  4:10 PM  Result Value Ref Range   Glucose-Capillary 285 (H) 70 - 99 mg/dL    Comment: Glucose reference range applies only to samples taken after fasting for at least 8 hours.  Glucose, capillary     Status: Abnormal   Collection Time: 09/27/21  7:57 PM  Result Value Ref Range   Glucose-Capillary 381 (H) 70 - 99 mg/dL    Comment: Glucose reference range applies only to samples taken after fasting for at least 8 hours.  Glucose, capillary     Status: Abnormal   Collection Time: 09/27/21 11:10 PM  Result Value Ref Range   Glucose-Capillary 324 (H) 70 - 99 mg/dL    Comment: Glucose reference range applies only to samples taken after fasting for at least 8 hours.  Glucose, capillary     Status: Abnormal   Collection Time: 09/28/21  3:27 AM  Result Value Ref Range   Glucose-Capillary 161 (H) 70 - 99 mg/dL    Comment: Glucose reference range applies only to samples taken after fasting for at least 8 hours.  Glucose, capillary     Status: Abnormal   Collection Time: 09/28/21  7:52 AM  Result Value Ref Range   Glucose-Capillary 145 (H) 70 - 99 mg/dL    Comment: Glucose reference range applies only to samples taken after fasting for at least 8 hours.  Glucose, capillary     Status: Abnormal   Collection Time: 09/28/21 11:08 AM  Result Value Ref Range   Glucose-Capillary 166 (H) 70 - 99 mg/dL    Comment: Glucose reference range applies only to samples taken after fasting for at least 8 hours.    DG CHEST PORT 1 VIEW  Result Date: 09/27/2021 CLINICAL DATA:  Code stroke.  Respiratory distress EXAM: PORTABLE CHEST 1 VIEW COMPARISON:  Two days ago FINDINGS: Low volume chest with cardiomegaly hazy basal/perihilar density. No Kerley lines, effusion, or air leak. IMPRESSION: Stable low volume chest with cardiomegaly  and presumed atelectasis. Electronically Signed   By: Jorje Guild M.D.   On: 09/27/2021 07:07    [id number] except as listed in admit H&P  Blood pressure (!) 153/64, pulse 75, temperature 97.9 F (36.6 C), temperature source Oral, resp. rate 18, height '5\' 6"'$  (1.676 m), weight 106.6 kg, SpO2 96 %.  PHYSICAL EXAM: Overall appearance: Breathing easily, without stridor.  She is in no obvious distress.  She does have significant hoarseness of her voice but during the evaluation the hoarseness started to clear somewhat. Head:  Normocephalic, atraumatic. Ears: External ears look healthy. Nose: Nasal prong oxygen in place.  External nose is healthy in appearance. Internal nasal exam reveals diffuse crusting bilaterally causing complete obstruction. Oral Cavity/Pharynx:  There are no mucosal lesions or masses identified. Larynx/Hypopharynx: See below Neuro:  No identifiable neurologic deficits. Neck: No palpable neck masses.  She is very tender along the anterior neck superior to the sternal notch.  Studies Reviewed: none  Procedures: Flexible fiberoptic laryngoscopy.  Nasal cavities were sprayed with Afrin/Xylocaine solution on multiple times and she was allowed to try to blow out some of the crusty material.  I was finally able to pass the scope through the left nasal cavity.  The nasopharynx is clear.  The oropharynx and hypopharynx are clear.  The larynx reveals that the cords are moving well.  The cords and the subglottic area is swollen with some slight fibrinous exudate bilaterally.  There is no pooling of secretions.   Assessment/Plan: Edema of the glottis and subglottic larynx following recent intubation and then repeat intubation.  There is no evidence of cord paralysis or any tumor present.  There is currently no stridor.  Allow more time for resolution of the swelling.  Unfortunately she is at risk for developing subglottic stenosis.  Recommend we treat for acid reflux aggressively.   Recommend keeping her blood sugars as close to normal as possible.  I will reevaluate her in a few weeks either in the hospital or in our clinic.   Dx:  J37.0  F17.200  R13.10  Izora Gala 09/28/2021, 3:27 PM

## 2021-09-28 NOTE — Progress Notes (Signed)
NAMEJyla Jones, MRN:  [id number], DOB:  1966/06/27, LOS: 62 ADMISSION DATE:  09/20/2021, CONSULTATION DATE: 10/10 REFERRING MD:  Dr. Laverta Baltimore, CHIEF COMPLAINT:  AMS   History of Present Illness:  55 y/o F who presented to [facility] on 10/10 with reports of acute altered mental status.   She was reportedly at home and went to smoke a cigarette 0200 with her nephew. She was apparently seen by her family at that time.  After smoking, the family heard a loud thud and she was found altered and confused.  On arrival to the ER she was noted to be agitated, confused, localized to pain and stated "that hurts" with stimulation.  She was seen initially as a CODE STROKE.  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 She was hypertensive with initial pressure of 240/112 with MAP of 136.  Initial labs showed glucose of 437, BUN 36, Sr CR 2.17, CK 316, ammonia 30, Mg 1.9, WBC 14.4. UDS was negative.  COVID & flu screening were negative.  CXR showed low lung volumes, ETT in good position, L>R interstitial opacities.    PCCM called for ICU admission.   Pertinent  Medical History  HTN DM   Significant Hospital Events: Including procedures, antibiotic start and stop dates in addition to other pertinent events   10/10 Admit with acute encephalopathy, hypertensive 10/12 extubated, increased WOB, re-intubated 10/14 extubated , lingering stridor, Received racemic epi x2 over the last 24 hours + another dose of Decadron, Mild agitation requiring restart of Precedex 10/15 swallow evaluation >> dysphagia 3 diet  Interim History / Subjective:  Still SOB. Had agitation overnight requiring precedex to be restarted and has 1:1 sitter. Sore throat. She admits to a history of asthma- only ever had albuterol.  Objective   Blood pressure (!) 121/48, pulse 64, temperature 97.7 F (36.5 C), temperature source Oral, resp. rate 20, height '5\' 6"'$  (1.676 m), weight 106.6  kg, SpO2 100 %.    Vent Mode: BIPAP;PCV FiO2 (%):  [40 %] 40 % Set Rate:  [15 bmp] 15 bmp PEEP:  [5 cmH20-10 cmH20] 5 cmH20 Pressure Support:  [0 cmH20] 0 cmH20   Intake/Output Summary (Last 24 hours) at 09/28/2021 0906 Last data filed at 09/28/2021 0748 Gross per 24 hour  Intake 1339.12 ml  Output 1050 ml  Net 289.12 ml    Filed Weights   09/24/21 0407 09/27/21 0400 09/28/21 0500  Weight: 107.2 kg 105.5 kg 106.6 kg    Examination: General: middle aged woman sitting in the chair in NAD HENT:  [place]/AT, still very hoarse voice. No harsh breath sounds over neck Lungs: scattered rhonchi in left base, wheezing improved, no longer using accessory expiratory muscles Cardiovascular: S1S2, RRR Abdomen: obese, soft, NT Extremities: no edema or cyanosis Neuro: awake and alert, cooperative with exam. Moving all extremities, answering questions appropriately.  Resp culture > abundant GNR, mod [facility]   Resolved Hospital Problem list     Assessment & Plan:   Acute respiratory failure with hypoxia. Initially there had been concern for subglottic stenosis, but her accessory breath sounds are more expiratory. CXR and elevated WBC are concerning for RML and LLL pneumonia.  Concern for obstructive lung disease, likely COPD with smoking history H/o tobacco abuse Previously intubated 2 times this admission, second time due to stridor, but had no laryngeal edema noted during reintubation. Extubated on 10/14.  -sputum culture pending -Con't cefepime, stop vanc. -adding Breo daily;  should go home on this at discharge -decrease steroids to prednisone 40 mg once daily -duonebs PRN -Seems to have improved respiratory status that she can go back to the floor. -wean O2 as able t maintain SpO2 >90%  Hoarseness persistent post-extubation, concern for VC injury -ENT consult  Acute hypertensive emergency > now just hypertensive Initial BP 240/112, MAP 136  Echo moderate LVH -P.o. meds  resumed-amlodipine 10 mg daily, coreg 25 mg BID, hydralazine 100 mg TID -adding losartan '25mg'$  daily --goal SBP < 0000000  Acute Metabolic Encephalopathy due to hypertensive emergency. Imaging negative for PRES. CT head negative. MRI negative. Sundowns at night -d/c precedex -seroquel '50mg'$  QHS; haldol PRN -con't 1:1 sitter  AKI, improving 10/2020 cr 1.7. Cr improved after gentle hydration 10/11. -strict I/Os -renally dose meds, avoid nephrotoxic medications -recheck tomorrow  DM with uncontrolled hyperglycemia -PTA SQ insulin levemir 55 u BID (120 u daily at home) , decrease today to 25units BID with decreased steroids -SSI PRN -goal BG 140-180 -adding losartan  Acute anemia due to critical illness -Transfuse for hemoglobin less than 7 or significant bleeding -recheck CBC tomorrow  Stable respiratory status to transfer back to the floor. TRH to assume care tomorrow.  Best Practice (right click and "Reselect all SmartList Selections" daily)  Diet/type: dysphagia diet (see orders) DVT prophylaxis: prophylactic heparin  GI prophylaxis: PPI Lines: N/A Foley:  N/A Code Status:  full code Last date of multidisciplinary goals of care discussion: Full CODE STATUS  Labs   CBC: Recent Labs  Lab 09/22/21 0448 09/22/21 1226 09/25/21 0316 09/26/21 0244 09/27/21 0448 09/27/21 0724  WBC 18.0*  --  21.1* 22.7* 21.9*  --   HGB 13.0 11.2* 11.5* 10.7* 10.7* 10.5*  HCT 40.1 33.0* 37.3 35.5* 34.4* 31.0*  MCV 89.7  --  93.3 94.7 91.7  --   PLT 164  --  216 266 303  --      Basic Metabolic Panel: Recent Labs  Lab 09/22/21 0448 09/22/21 1226 09/22/21 1654 09/23/21 0159 09/24/21 0349 09/25/21 0316 09/26/21 0244 09/27/21 0448 09/27/21 0724  NA 142   < >  --  142 145 147* 141 138 142  K 3.1*   < >  --  3.8 4.0 4.1 4.4 4.1 4.1  CL 108  --   --  110 111 113* 111 109  --   CO2 24  --   --  '22 25 26 '$ 21* 24  --   GLUCOSE 184*  --   --  281* 131* 94 307* 138*  --   BUN 36*  --   --   36* 40* 42* 55* 55*  --   CREATININE 2.18*  --   --  2.02* 2.07* 2.19* 2.45* 1.99*  --   CALCIUM 9.7  --   --  9.4 9.5 9.6 9.5 9.7  --   MG 1.9  --  2.0  --   --  2.4 2.3 2.2  --   PHOS 3.4  --  3.3  --   --  3.8 3.5 3.2  --    < > = values in this interval not displayed.    GFR: Estimated Creatinine Clearance: 39.9 mL/min (A) (by C-G formula based on SCr of 1.99 mg/dL (H)). Recent Labs  Lab 09/22/21 0448 09/25/21 0316 09/26/21 0244 09/27/21 0448  WBC 18.0* 21.1* 22.7* 21.9*     Liver Function Tests: No results for input(s): AST, ALT, ALKPHOS, BILITOT, PROT, ALBUMIN in the last 168 hours.  No results for input(s): LIPASE, AMYLASE in the last 168 hours. No results for input(s): AMMONIA in the last 168 hours.   ABG    Component Value Date/Time   PHART 7.345 (L) 09/27/2021 0724   PCO2ART 47.0 09/27/2021 0724   PO2ART 73 (L) 09/27/2021 0724   HCO3 25.7 09/27/2021 0724   TCO2 27 09/27/2021 0724   O2SAT 94.0 09/27/2021 [place] Xochilth Standish, DO 09/28/21 11:12 AM [facility]

## 2021-09-29 ENCOUNTER — Inpatient Hospital Stay (HOSPITAL_COMMUNITY): Payer: Medicare Other

## 2021-09-29 ENCOUNTER — Other Ambulatory Visit (HOSPITAL_COMMUNITY): Payer: Self-pay

## 2021-09-29 ENCOUNTER — Encounter (HOSPITAL_COMMUNITY): Payer: Self-pay | Admitting: Pulmonary Disease

## 2021-09-29 DIAGNOSIS — J4551 Severe persistent asthma with (acute) exacerbation: Secondary | ICD-10-CM

## 2021-09-29 DIAGNOSIS — J384 Edema of larynx: Secondary | ICD-10-CM

## 2021-09-29 DIAGNOSIS — G9340 Encephalopathy, unspecified: Secondary | ICD-10-CM | POA: Diagnosis not present

## 2021-09-29 DIAGNOSIS — J189 Pneumonia, unspecified organism: Secondary | ICD-10-CM | POA: Diagnosis not present

## 2021-09-29 DIAGNOSIS — R490 Dysphonia: Secondary | ICD-10-CM | POA: Diagnosis not present

## 2021-09-29 LAB — CBC
HCT: 34.3 % — ABNORMAL LOW (ref 36.0–46.0)
Hemoglobin: 10.8 g/dL — ABNORMAL LOW (ref 12.0–15.0)
MCH: 28.6 pg (ref 26.0–34.0)
MCHC: 31.5 g/dL (ref 30.0–36.0)
MCV: 90.7 fL (ref 80.0–100.0)
Platelets: 308 10*3/uL (ref 150–400)
RBC: 3.78 MIL/uL — ABNORMAL LOW (ref 3.87–5.11)
RDW: 13.1 % (ref 11.5–15.5)
WBC: 20.4 10*3/uL — ABNORMAL HIGH (ref 4.0–10.5)
nRBC: 0.1 % (ref 0.0–0.2)

## 2021-09-29 LAB — GLUCOSE, CAPILLARY
Glucose-Capillary: 139 mg/dL — ABNORMAL HIGH (ref 70–99)
Glucose-Capillary: 149 mg/dL — ABNORMAL HIGH (ref 70–99)
Glucose-Capillary: 257 mg/dL — ABNORMAL HIGH (ref 70–99)
Glucose-Capillary: 278 mg/dL — ABNORMAL HIGH (ref 70–99)
Glucose-Capillary: 281 mg/dL — ABNORMAL HIGH (ref 70–99)
Glucose-Capillary: 398 mg/dL — ABNORMAL HIGH (ref 70–99)

## 2021-09-29 LAB — BASIC METABOLIC PANEL
Anion gap: 8 (ref 5–15)
BUN: 63 mg/dL — ABNORMAL HIGH (ref 6–20)
CO2: 19 mmol/L — ABNORMAL LOW (ref 22–32)
Calcium: 9.3 mg/dL (ref 8.9–10.3)
Chloride: 105 mmol/L (ref 98–111)
Creatinine, Ser: 1.99 mg/dL — ABNORMAL HIGH (ref 0.44–1.00)
GFR, Estimated: 29 mL/min — ABNORMAL LOW (ref >60)
Glucose, Bld: 206 mg/dL — ABNORMAL HIGH (ref 70–99)
Potassium: 4.6 mmol/L (ref 3.5–5.1)
Sodium: 132 mmol/L — ABNORMAL LOW (ref 135–145)

## 2021-09-29 LAB — CULTURE, RESPIRATORY W GRAM STAIN: Culture: NORMAL

## 2021-09-29 MED ORDER — GUAIFENESIN ER 600 MG PO TB12
600.0000 mg | ORAL_TABLET | Freq: Two times a day (BID) | ORAL | Status: DC | PRN
  Administered 2021-10-02: 600 mg via ORAL
  Filled 2021-09-29: qty 1

## 2021-09-29 MED ORDER — SALINE SPRAY 0.65 % NA SOLN
2.0000 | Freq: Every day | NASAL | Status: DC
  Administered 2021-09-29 – 2021-10-04 (×6): 2 via NASAL
  Filled 2021-09-29: qty 44

## 2021-09-29 MED ORDER — LORATADINE 10 MG PO TABS
10.0000 mg | ORAL_TABLET | Freq: Every day | ORAL | Status: DC
  Administered 2021-09-29 – 2021-10-04 (×5): 10 mg via ORAL
  Filled 2021-09-29 (×6): qty 1

## 2021-09-29 MED ORDER — PANTOPRAZOLE SODIUM 40 MG IV SOLR
40.0000 mg | INTRAVENOUS | Status: DC
  Administered 2021-09-29: 40 mg via INTRAVENOUS
  Filled 2021-09-29: qty 40

## 2021-09-29 MED ORDER — LABETALOL HCL 5 MG/ML IV SOLN: 10.0000 mg | INTRAVENOUS | Status: DC | PRN

## 2021-09-29 MED ORDER — MONTELUKAST SODIUM 10 MG PO TABS
10.0000 mg | ORAL_TABLET | Freq: Every day | ORAL | Status: DC
  Administered 2021-09-29 – 2021-10-04 (×6): 10 mg via ORAL
  Filled 2021-09-29 (×6): qty 1

## 2021-09-29 MED ORDER — INSULIN ASPART 100 UNIT/ML IJ SOLN
0.0000 [IU] | Freq: Three times a day (TID) | INTRAMUSCULAR | Status: DC
  Administered 2021-09-29 (×2): 8 [IU] via SUBCUTANEOUS
  Administered 2021-09-29: 2 [IU] via SUBCUTANEOUS
  Administered 2021-09-30: 5 [IU] via SUBCUTANEOUS
  Administered 2021-09-30: 2 [IU] via SUBCUTANEOUS
  Administered 2021-10-01: 3 [IU] via SUBCUTANEOUS
  Administered 2021-10-01: 8 [IU] via SUBCUTANEOUS
  Administered 2021-10-01: 2 [IU] via SUBCUTANEOUS
  Administered 2021-10-02 (×2): 8 [IU] via SUBCUTANEOUS
  Administered 2021-10-02: 3 [IU] via SUBCUTANEOUS
  Administered 2021-10-03 (×2): 2 [IU] via SUBCUTANEOUS
  Administered 2021-10-03 – 2021-10-04 (×2): 5 [IU] via SUBCUTANEOUS
  Administered 2021-10-04 (×2): 2 [IU] via SUBCUTANEOUS

## 2021-09-29 MED ORDER — MOMETASONE FURO-FORMOTEROL FUM 100-5 MCG/ACT IN AERO
2.0000 | INHALATION_SPRAY | Freq: Two times a day (BID) | RESPIRATORY_TRACT | Status: DC
  Filled 2021-09-29: qty 8.8

## 2021-09-29 MED ORDER — INSULIN ASPART 100 UNIT/ML IJ SOLN
3.0000 [IU] | Freq: Three times a day (TID) | INTRAMUSCULAR | Status: DC
  Administered 2021-09-29 – 2021-10-03 (×10): 3 [IU] via SUBCUTANEOUS

## 2021-09-29 MED ORDER — PANTOPRAZOLE SODIUM 40 MG IV SOLR
40.0000 mg | Freq: Two times a day (BID) | INTRAVENOUS | Status: DC
Start: 2021-09-29 — End: 2021-10-01
  Administered 2021-09-29 – 2021-10-01 (×4): 40 mg via INTRAVENOUS
  Filled 2021-09-29 (×4): qty 40

## 2021-09-29 MED ORDER — LIVING WELL WITH DIABETES BOOK
Freq: Once | Status: AC
  Filled 2021-09-29: qty 1

## 2021-09-29 MED ORDER — HYDRALAZINE HCL 25 MG PO TABS
25.0000 mg | ORAL_TABLET | Freq: Three times a day (TID) | ORAL | Status: DC
  Administered 2021-09-29 – 2021-10-01 (×5): 25 mg via ORAL
  Filled 2021-09-29 (×5): qty 1

## 2021-09-29 MED ORDER — LABETALOL HCL 5 MG/ML IV SOLN
10.0000 mg | INTRAVENOUS | Status: DC | PRN
  Administered 2021-09-29 – 2021-10-12 (×2): 10 mg via INTRAVENOUS
  Filled 2021-09-29 (×2): qty 4

## 2021-09-29 MED ORDER — GUAIFENESIN ER 600 MG PO TB12
1200.0000 mg | ORAL_TABLET | Freq: Two times a day (BID) | ORAL | Status: AC
  Administered 2021-09-29 – 2021-09-30 (×3): 1200 mg via ORAL
  Filled 2021-09-29 (×4): qty 2

## 2021-09-29 NOTE — TOC Benefit Eligibility Note (Signed)
Patient Teacher, English as a foreign language completed.    The patient is currently admitted and upon discharge could be taking Breo Ellipta 200-25 mcg.  The current 30 day co-pay is, $61.64 due to being in Coverage gap (donut hole).   The patient is currently admitted and upon discharge could be taking Dulara 100-5 mcg.  The current 30 day co-pay is, $52.47 due to being in Coverage gap (donut hole).   The patient is currently admitted and upon discharge could be taking Albuterol 108 (90 base) mcg.  The current 30 day co-pay is, $4.65   The patient is insured through [employer], [place] Patient Advocate Specialist [place] Team Direct Number: [phone]  Fax: [phone]

## 2021-09-29 NOTE — Progress Notes (Signed)
[facility] notified of persisting HTN, too soon to give PRN hydralazine again. Awaiting new orders.

## 2021-09-29 NOTE — Progress Notes (Addendum)
Inpatient Diabetes Program Recommendations  AACE/ADA: New Consensus Statement on Inpatient Glycemic Control (2015)  Target Ranges:  Prepandial:   less than 140 mg/dL      Peak postprandial:   less than 180 mg/dL (1-2 hours)      Critically ill patients:  140 - 180 mg/dL   Lab Results  Component Value Date   GLUCAP 281 (H) 09/29/2021   HGBA1C 12.7 (H) 09/20/2021    Review of Glycemic Control Results for Maureen Jones, Maureen Jones (MRN [id number]) as of 09/29/2021 09:43  Ref. Range 09/28/2021 16:17 09/28/2021 19:29 09/29/2021 00:05 09/29/2021 03:24 09/29/2021 07:30  Glucose-Capillary Latest Ref Range: 70 - 99 mg/dL 213 (H) 349 (H) 257 (H) 149 (H) 281 (H)   Diabetes history: DM 2 Outpatient Diabetes medications:  Tresiba 120 units daily Novolog 18 units bid Current orders for Inpatient glycemic control:  Novolog moderate q 4 hours Levemir 25 units bid Novolog 3 units tid with meals Prednisone 40 mg daily Inpatient Diabetes Program Recommendations:   Consider increasing Levemir to 30 units bid.  Also consider changing Novolog to tid with meals with HS scale.    Thanks,  Adah Perl, RN, BC-ADM Inpatient Diabetes Coordinator Pager [phone]  (8a-5p)  Addendum 12:30 p- Spoke with patient regarding home glycemic control.  She voices concern regarding high blood sugars and states that she takes her insulin as ordered.  She states that in [place], her MD "gave up".  She was seeing endocrinologist in [facility] but wants to see MD in [place].  She only takes insulin at home for DM.  May need medication added for insulin resistance such as GLP-1?? She has been on Trulicity before, but states she is willing to try GLP again if needed?    Will order LWWD booklet and nutrition consult.  She voices frustration with DM and how it is so hard to control.

## 2021-09-29 NOTE — Progress Notes (Signed)
RT spoke to patient about wearing the CPAP tonight. Pt says she is able to put it on herself and will call when she needs help.

## 2021-09-29 NOTE — Progress Notes (Signed)
PROGRESS NOTE    Maureen Jones  [id number] DOB: [birth year] DOA: 09/20/2021 PCP: Pcp, No   Brief Narrative: 55 year old with past medical history significant for hypertension and diabetes who presented to [facility] on 10/10 with acute altered mental status.  She was reportedly at home and went to smoke a cigarette at 2 AM with her nephew.  She was apparently seen by her family at that time.  After smoking, the family heard a loud noise and she was found altered and confused.  On arrival to the ER she was noted to be agitated, confused.  She was seen initially as a code stroke.  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  She was hypertensive with initial pressure of 240/ 112.  CBG 437, creatinine 2.1, ammonia 30, white blood cell 14, UDS was negative.  Chest x-ray showed low lung volume.  Patient was intubated on admission for acute encephalopathy.  On 10/12 patient was extubated, due to increased work of breathing she was reintubated.  On 10/14 she was extubated, she had some stridors, received racemic epi, she was transiently on Precedex for agitation.  She has remained stable and she was transitioned to [facility] on 10/19.  He was evaluated by ENT who recommended treatment for reflux, patient has some mild edema of the glottis and supraglottic larynx post intubation.   Assessment & Plan:   Active Problems:   Encephalopathy acute   Endotracheal tube present  1-Acute hypoxic respiratory failure: Initially admitted for altered mental status, airway protection. Previously intubated 2 times during this admission, second time due to her stridor, but no significant laryngeal edema noted during reintubation.  Extubated on 10/14 with ongoing hoarseness.  ENT consulted.  Mild edema glottis and supraglottic larynx.  ENT recommended PPI and follow-up as an outpatient.  Patient is at risk for developing subglottic stenosis. Chest x-ray  concerning for right middle lobe and lower lobe pneumonia.  Plan to continue with IV antibiotics.  Need at least 7 days. Prednisone taper. Daily Claritin and Singulair. She has history of asthma, she would need at discharge Dulera, Spiriva,  2-Hoarseness: Persistent postextubation due to subglottic edema ENT consulted recommended aggressive care at management.  Protonix twice daily Is to follow-up with ENT as an outpatient.  Acute hypertensive emergency: Initial blood pressure 240/112 ECHO; Moderate left ventricular hypertrophy Continue with amlodipine, Coreg,. Hold cozaar.  Start scheduled hydralazine  Acute metabolic encephalopathy due to hypertensive emergency: CT head negative MRI negative. sundown at night. Continue with Seroquel  Dysphagia: Evaluated by speech today who recommended regular solids and thin liquids diet. Ptosis: Suspect related to steroid.  On IV antibiotics as well.  AKI: Improved monitor Cr g 1.9.  Peak to 2.4. Hold Cozaar.  Diabetes uncontrolled hyperglycemia: Continue with Levemir 25 units twice daily.  Will add meal coverage.  Anemia of critical illness  Nutrition Problem: Inadequate oral intake Etiology: inability to eat    Signs/Symptoms: NPO status    Interventions: Tube feeding  Estimated body mass index is 37.75 kg/m as calculated from the following:   Height as of this encounter: '5\' 6"'$  (1.676 m).   Weight as of this encounter: 106.1 kg.   DVT prophylaxis: Heparin Code Status: Full code Family Communication: Disposition Plan:  Status is: Inpatient  Remains inpatient appropriate because: Uncontrolled hypertension, respiratory failure, hyperglycemia        Consultants:  CM admitted patient ENT  Procedures:    Antimicrobials:  Subjective: Alert, she denies worsening dyspnea or cough.  Objective: Vitals:   09/29/21 0650 09/29/21 0700 09/29/21 0728 09/29/21 0810  BP: (!) 174/76 (!) 178/95    Pulse: 83 87 87    Resp: '14 20 19   '$ Temp:   98 F (36.7 C)   TempSrc:   Oral   SpO2: 92% 95% 94% 93%  Weight:      Height:        Intake/Output Summary (Last 24 hours) at 09/29/2021 0811 Last data filed at 09/29/2021 0700 Gross per 24 hour  Intake 1348.87 ml  Output 2750 ml  Net -1401.13 ml   Filed Weights   09/27/21 0400 09/28/21 0500 09/29/21 0500  Weight: 105.5 kg 106.6 kg 106.1 kg    Examination:  General exam: Appears calm and comfortable  Respiratory system: Respiratory effort normal.  Sporadic wheezing Cardiovascular system: S1 & S2 heard, RRR. No JVD, murmurs, rubs, gallops or clicks. No pedal edema. Gastrointestinal system: Abdomen is nondistended, soft and nontender. No organomegaly or masses felt. Normal bowel sounds heard. Central nervous system: Alert and oriented.  Extremities: Symmetric 5 x 5 power.    Data Reviewed: I have personally reviewed following labs and imaging studies  CBC: Recent Labs  Lab 09/25/21 0316 09/26/21 0244 09/27/21 0448 09/27/21 0724 09/29/21 0158  WBC 21.1* 22.7* 21.9*  --  20.4*  HGB 11.5* 10.7* 10.7* 10.5* 10.8*  HCT 37.3 35.5* 34.4* 31.0* 34.3*  MCV 93.3 94.7 91.7  --  90.7  PLT 216 266 303  --  [id number]   Basic Metabolic Panel: Recent Labs  Lab 09/22/21 1654 09/23/21 0159 09/24/21 0349 09/25/21 0316 09/26/21 0244 09/27/21 0448 09/27/21 0724 09/29/21 0158  NA  --    < > 145 147* 141 138 142 132*  K  --    < > 4.0 4.1 4.4 4.1 4.1 4.6  CL  --    < > 111 113* 111 109  --  105  CO2  --    < > 25 26 21* 24  --  19*  GLUCOSE  --    < > 131* 94 307* 138*  --  206*  BUN  --    < > 40* 42* 55* 55*  --  63*  CREATININE  --    < > 2.07* 2.19* 2.45* 1.99*  --  1.99*  CALCIUM  --    < > 9.5 9.6 9.5 9.7  --  9.3  MG 2.0  --   --  2.4 2.3 2.2  --   --   PHOS 3.3  --   --  3.8 3.5 3.2  --   --    < > = values in this interval not displayed.   GFR: Estimated Creatinine Clearance: 39.8 mL/min (A) (by C-G formula based on SCr of 1.99 mg/dL  (H)). Liver Function Tests: No results for input(s): AST, ALT, ALKPHOS, BILITOT, PROT, ALBUMIN in the last 168 hours. No results for input(s): LIPASE, AMYLASE in the last 168 hours. No results for input(s): AMMONIA in the last 168 hours. Coagulation Profile: No results for input(s): INR, PROTIME in the last 168 hours. Cardiac Enzymes: No results for input(s): CKTOTAL, CKMB, CKMBINDEX, TROPONINI in the last 168 hours. BNP (last 3 results) No results for input(s): PROBNP in the last 8760 hours. HbA1C: No results for input(s): HGBA1C in the last 72 hours. CBG: Recent Labs  Lab 09/28/21 1617 09/28/21 1929 09/29/21 0005 09/29/21 0324 09/29/21 0730  GLUCAP 213*  349* 257* 149* 281*   Lipid Profile: No results for input(s): CHOL, HDL, LDLCALC, TRIG, CHOLHDL, LDLDIRECT in the last 72 hours. Thyroid Function Tests: No results for input(s): TSH, T4TOTAL, FREET4, T3FREE, THYROIDAB in the last 72 hours. Anemia Panel: No results for input(s): VITAMINB12, FOLATE, FERRITIN, TIBC, IRON, RETICCTPCT in the last 72 hours. Sepsis Labs: No results for input(s): PROCALCITON, LATICACIDVEN in the last 168 hours.  Recent Results (from the past 240 hour(s))  Resp Panel by RT-PCR (Flu A&B, Covid) Nasopharyngeal Swab     Status: None   Collection Time: 09/20/21  3:22 AM   Specimen: Nasopharyngeal Swab; Nasopharyngeal(NP) swabs in vial transport medium  Result Value Ref Range Status   SARS Coronavirus 2 by RT PCR NEGATIVE NEGATIVE Final    Comment: (NOTE) SARS-CoV-2 target nucleic acids are NOT DETECTED.  The SARS-CoV-2 RNA is generally detectable in upper respiratory specimens during the acute phase of infection. The lowest concentration of SARS-CoV-2 viral copies this assay can detect is 138 copies/mL. A negative result does not preclude SARS-Cov-2 infection and should not be used as the sole basis for treatment or other patient management decisions. A negative result may occur with  improper  specimen collection/handling, submission of specimen other than nasopharyngeal swab, presence of viral mutation(s) within the areas targeted by this assay, and inadequate number of viral copies(<138 copies/mL). A negative result must be combined with clinical observations, patient history, and epidemiological information. The expected result is Negative.  Fact Sheet for Patients:  EntrepreneurPulse.com.au  Fact Sheet for Healthcare Providers:  IncredibleEmployment.be  This test is no t yet approved or cleared by the [place] FDA and  has been authorized for detection and/or diagnosis of SARS-CoV-2 by FDA under an Emergency Use Authorization (EUA). This EUA will remain  in effect (meaning this test can be used) for the duration of the COVID-19 declaration under Section 564(b)(1) of the Act, 21 U.S.C.section 360bbb-3(b)(1), unless the authorization is terminated  or revoked sooner.       Influenza A by PCR NEGATIVE NEGATIVE Final   Influenza B by PCR NEGATIVE NEGATIVE Final    Comment: (NOTE) The Xpert Xpress SARS-CoV-2/FLU/RSV plus assay is intended as an aid in the diagnosis of influenza from Nasopharyngeal swab specimens and should not be used as a sole basis for treatment. Nasal washings and aspirates are unacceptable for Xpert Xpress SARS-CoV-2/FLU/RSV testing.  Fact Sheet for Patients: EntrepreneurPulse.com.au  Fact Sheet for Healthcare Providers: IncredibleEmployment.be  This test is not yet approved or cleared by the [place] FDA and has been authorized for detection and/or diagnosis of SARS-CoV-2 by FDA under an Emergency Use Authorization (EUA). This EUA will remain in effect (meaning this test can be used) for the duration of the COVID-19 declaration under Section 564(b)(1) of the Act, 21 U.S.C. section 360bbb-3(b)(1), unless the authorization is terminated or revoked.  Performed at  [facility], [place] [address], [place], [place] [address]   Blood culture (routine x 2)     Status: None   Collection Time: 09/20/21  6:48 AM   Specimen: BLOOD LEFT HAND  Result Value Ref Range Status   Specimen Description BLOOD LEFT HAND  Final   Special Requests   Final    BOTTLES DRAWN AEROBIC AND ANAEROBIC Blood Culture results may not be optimal due to an inadequate volume of blood received in culture bottles   Culture   Final    NO GROWTH 5 DAYS Performed at [facility], [place]  [address] [place], [place] [address]    Report Status 09/25/2021 FINAL  Final  Blood culture (routine x 2)     Status: None   Collection Time: 09/20/21  6:55 AM   Specimen: BLOOD  Result Value Ref Range Status   Specimen Description BLOOD RIGHT ANTECUBITAL  Final   Special Requests   Final    BOTTLES DRAWN AEROBIC AND ANAEROBIC Blood Culture results may not be optimal due to an inadequate volume of blood received in culture bottles   Culture   Final    NO GROWTH 5 DAYS Performed at [facility], [place] [address] [place], [place] [address]    Report Status 09/25/2021 FINAL  Final  MRSA Next Gen by PCR, Nasal     Status: None   Collection Time: 09/20/21 11:24 AM   Specimen: Urine, Catheterized; Nasal Swab  Result Value Ref Range Status   MRSA by PCR Next Gen NOT DETECTED NOT DETECTED Final    Comment: (NOTE) The GeneXpert MRSA Assay (FDA approved for NASAL specimens only), is one component of a comprehensive MRSA colonization surveillance program. It is not intended to diagnose MRSA infection nor to guide or monitor treatment for MRSA infections. Test performance is not FDA approved in patients less than 35 years old. Performed at [facility], [place] [address], [place], [place] [address]   Culture, Respiratory w Gram Stain     Status: None   Collection Time: 09/24/21  9:48 AM   Specimen: Tracheal Aspirate; Respiratory  Result Value Ref Range Status   Specimen Description  TRACHEAL ASPIRATE  Final   Special Requests NONE  Final   Gram Stain   Final    FEW WBC PRESENT, PREDOMINANTLY PMN NO ORGANISMS SEEN    Culture   Final    RARE Normal respiratory flora-no Staph aureus or Pseudomonas seen Performed at [facility], [address] [place], [place] [address]    Report Status 09/26/2021 FINAL  Final  MRSA Next Gen by PCR, Nasal     Status: None   Collection Time: 09/27/21  7:50 AM   Specimen: Nasal Mucosa; Nasal Swab  Result Value Ref Range Status   MRSA by PCR Next Gen NOT DETECTED NOT DETECTED Final    Comment: (NOTE) The GeneXpert MRSA Assay (FDA approved for NASAL specimens only), is one component of a comprehensive MRSA colonization surveillance program. It is not intended to diagnose MRSA infection nor to guide or monitor treatment for MRSA infections. Test performance is not FDA approved in patients less than 39 years old. Performed at [facility], [place] [address] [place], [place] [address]   Culture, Respiratory w Gram Stain     Status: None (Preliminary result)   Collection Time: 09/27/21  2:22 PM   Specimen: Tracheal Aspirate; Respiratory  Result Value Ref Range Status   Specimen Description TRACHEAL ASPIRATE  Final   Special Requests NONE  Final   Gram Stain   Final    ABUNDANT WBC PRESENT,BOTH PMN AND MONONUCLEAR ABUNDANT GRAM NEGATIVE RODS MODERATE GRAM POSITIVE COCCI    Culture   Final    ABUNDANT Normal respiratory flora-no Staph aureus or Pseudomonas seen Performed at [facility], [address], [place],  [address]    Report Status PENDING  Incomplete         Radiology Studies: No results found.      Scheduled Meds:  amLODipine  10 mg Oral Daily   carvedilol  25 mg Oral BID WC   chlorhexidine  15 mL Mouth Rinse BID   Chlorhexidine Gluconate Cloth  6 each Topical Daily   feeding supplement  237 mL Oral BID BM   fluticasone furoate-vilanterol  1 puff Inhalation Daily   heparin  5,000  Units Subcutaneous Q8H   insulin aspart  0-15 Units Subcutaneous Q4H   insulin detemir  25 Units Subcutaneous BID   losartan  25 mg Oral Daily   mouth rinse  15 mL Mouth Rinse q12n4p   pantoprazole (PROTONIX) IV  40 mg Intravenous Q24H   predniSONE  40 mg Oral Q breakfast   QUEtiapine  50 mg Oral QHS   Continuous Infusions:  ceFEPime (MAXIPIME) IV 2 g (09/28/21 2119)     LOS: 9 days    Time spent: 35 minutes   Darold Miley A Kuba Shepherd, MD [facility]   If 7PM-7AM, please contact night-coverage [email or web address]  09/29/2021, 8:11 AM

## 2021-09-29 NOTE — Progress Notes (Signed)
NAMEMarijke Jones, MRN:  [id number], DOB:  March 13, 1966, LOS: 59 ADMISSION DATE:  09/20/2021, CONSULTATION DATE: 10/10 REFERRING MD:  Dr. Laverta Jones, CHIEF COMPLAINT:  AMS   History of Present Illness:  55 y/o F who presented to [facility] on 10/10 with reports of acute altered mental status.   She was reportedly at home and went to smoke a cigarette 0200 with her nephew. She was apparently seen by her family at that time.  After smoking, the family heard a loud thud and she was found altered and confused.  On arrival to the ER she was noted to be agitated, confused, localized to pain and stated "that hurts" with stimulation.  She was seen initially as a CODE STROKE.  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 She was hypertensive with initial pressure of 240/112 with MAP of 136.  Initial labs showed glucose of 437, BUN 36, Sr CR 2.17, CK 316, ammonia 30, Mg 1.9, WBC 14.4. UDS was negative.  COVID & flu screening were negative.  CXR showed low lung volumes, ETT in good position, L>R interstitial opacities.    PCCM called for ICU admission.   Pertinent  Medical History  HTN DM   Significant Hospital Events: Including procedures, antibiotic start and stop dates in addition to other pertinent events   10/10 Admit with acute encephalopathy, hypertensive 10/12 extubated, increased WOB, re-intubated 10/14 extubated , lingering stridor, Received racemic epi x2 over the last 24 hours + another dose of Decadron, Mild agitation requiring restart of Precedex 10/15 swallow evaluation >> dysphagia 3 diet  Interim History / Subjective:  Still hoarse, having chest congestion and trouble coughing mucus up. She denies reflux symptoms. She was evaluated by ENT yesterday- subglottic swelling but no VC issues.  Objective   Blood pressure (!) 178/95, pulse 87, temperature 98 F (36.7 C), temperature source Axillary, resp. rate 20, height '5\' 6"'$  (1.676 m),  weight 106.1 kg, SpO2 95 %.        Intake/Output Summary (Last 24 hours) at 09/29/2021 0718 Last data filed at 09/29/2021 0700 Gross per 24 hour  Intake 1708.87 ml  Output 2750 ml  Net -1041.13 ml    Filed Weights   09/27/21 0400 09/28/21 0500 09/29/21 0500  Weight: 105.5 kg 106.6 kg 106.1 kg    Examination: General: middle aged woman sitting up in bed in NAD HENT: Flushing/AT, eyes anicteric Neck: no coarse breath sounds over neck Lungs: more rhonchi today, still hoarse. No accessory muscle use or stridor. No wheezing. Cardiovascular: S1S2, RRR Abdomen: obese, ND Extremities: no clubbing or cyanosis Neuro: awake and alert, moving all extremities, answering questions appropriately.  Resp culture > abundant GNR, mod GPC> normal flora WBC 20   Resolved Hospital Problem list     Assessment & Plan:   Acute respiratory failure with hypoxia. Initially there had been concern for subglottic stenosis, but her accessory breath sounds are more expiratory. CXR and elevated WBC are concerning for RML and LLL pneumonia.  Concern for obstructive lung disease, asthma vs COPD Glottic & subglottic edema post-extubation, high risk of developing subglottic stenosis H/o tobacco abuse Previously intubated 2 times this admission, second time due to stridor, but had no laryngeal edema noted during reintubation.  Extubated on 10/14 with ongoing hoarseness. Now confirmed glottic and subglottic edema when ENT scoped her. -sputum culture pending; needs to continue resistant GNR coverage for 7 days given recent course of ceftriaxone -Con't LABA-ICS- Breo  here. At discharge can do Dulera, Spiriva, Advair, Wixella-- whichever is least expensive. -Con't steroids--prednisone 40 mg once daily-- first dose today. IF asthma worsens will need to go back to IV -duonebs PRN -adding claritin and singulair -Seems to have improved respiratory status that she can go back to the floor. I worry that she has poor insight  into her symptom burden, which is dangerous as an asthmatic, especially with high risk of developing subglottic stenosis. These symptoms could be very similar and hard to differentiate which disease process is worsening.  Hoarseness persistent post-extubation due to subglottic edema -Appreciate ENT's management> recommended aggressive GERD treatment. Will follow her as an outpatient as well.  -con't to monitor  Acute hypertensive emergency > now just hypertensive Initial BP 240/112, MAP 136  Echo moderate LVH -On amlodipine 10 mg daily, coreg 25 mg BID, hydralazine 100 mg TID, losartan -management per primary  Acute Metabolic Encephalopathy due to hypertensive emergency. Imaging negative for PRES. CT head negative. MRI negative. Sundowns at night -seroquel '50mg'$  QHS; haldol PRN. Checking EKG today.  AKI, improving 10/2020 cr 1.7. Cr improved after gentle hydration 10/11. -per primary  DM with uncontrolled hyperglycemia -per primary  Acute anemia due to critical illness -per primary  PCCM will continue to follow.  Best Practice (right click and "Reselect all SmartList Selections" daily)  Per primary  Labs   CBC: Recent Labs  Lab 09/25/21 0316 09/26/21 0244 09/27/21 0448 09/27/21 0724 09/29/21 0158  WBC 21.1* 22.7* 21.9*  --  20.4*  HGB 11.5* 10.7* 10.7* 10.5* 10.8*  HCT 37.3 35.5* 34.4* 31.0* 34.3*  MCV 93.3 94.7 91.7  --  90.7  PLT 216 266 303  --  308     Basic Metabolic Panel: Recent Labs  Lab 09/22/21 1654 09/23/21 0159 09/24/21 0349 09/25/21 0316 09/26/21 0244 09/27/21 0448 09/27/21 0724 09/29/21 0158  NA  --    < > 145 147* 141 138 142 132*  K  --    < > 4.0 4.1 4.4 4.1 4.1 4.6  CL  --    < > 111 113* 111 109  --  105  CO2  --    < > 25 26 21* 24  --  19*  GLUCOSE  --    < > 131* 94 307* 138*  --  206*  BUN  --    < > 40* 42* 55* 55*  --  63*  CREATININE  --    < > 2.07* 2.19* 2.45* 1.99*  --  1.99*  CALCIUM  --    < > 9.5 9.6 9.5 9.7  --  9.3   MG 2.0  --   --  2.4 2.3 2.2  --   --   PHOS 3.3  --   --  3.8 3.5 3.2  --   --    < > = values in this interval not displayed.    GFR: Estimated Creatinine Clearance: 39.8 mL/min (A) (by C-G formula based on SCr of 1.99 mg/dL (H)). Recent Labs  Lab 09/25/21 0316 09/26/21 0244 09/27/21 0448 09/29/21 0158  WBC 21.1* 22.7* 21.9* 20.4*     Liver Function Tests: No results for input(s): AST, ALT, ALKPHOS, BILITOT, PROT, ALBUMIN in the last 168 hours.  No results for input(s): LIPASE, AMYLASE in the last 168 hours. No results for input(s): AMMONIA in the last 168 hours.   ABG    Component Value Date/Time   PHART 7.345 (L) 09/27/2021 0724   PCO2ART  47.0 09/27/2021 0724   PO2ART 73 (L) 09/27/2021 0724   HCO3 25.7 09/27/2021 0724   TCO2 27 09/27/2021 0724   O2SAT 94.0 09/27/2021 [place] Terryn Rosenkranz, DO 09/29/21 9:27 AM [facility]

## 2021-09-29 NOTE — Progress Notes (Signed)
Occupational Therapy Treatment Patient Details Name: Maureen Jones MRN: [id number] DOB: 09-19-1966 Today's Date: 09/29/2021   History of present illness Pt is a 55 y.o. female admitted 09/20/21 with AMS. 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 CXR showed low lung volumes, L>R interstitial opacities. Workup for acute metabolic encephalopathy in setting of hypertensive urgency, possible PRES. ETT 10/10-10/12, reintubated 10/12-10/14. Course complicated by confusion/agitation overnight 10/17. PMH includes HTN, DM.   OT comments  Pt did not pass pill box cognitive test due to extended time required to complete, but was noted to double check and correct mistakes x 2 without cues. Pt ambulated with min assist to sink for grooming and returned to bed. Pt aware of dysphagia and recommendation of thickened liquids, directed OT to remove thin liquids from her table so she would not accidentally drink from those cups.    Recommendations for follow up therapy are one component of a multi-disciplinary discharge planning process, led by the attending physician.  Recommendations may be updated based on patient status, additional functional criteria and insurance authorization.    Follow Up Recommendations  Home health OT;Supervision/Assistance - 24 hour    Equipment Recommendations  3 in 1 bedside commode    Recommendations for Other Services      Precautions / Restrictions Precautions Precautions: Fall Precaution Comments: goal SBP < 160 Restrictions Weight Bearing Restrictions: No       Mobility Bed Mobility Overal bed mobility: Needs Assistance Bed Mobility: Supine to Sit;Sit to Supine     Supine to sit: Supervision Sit to supine: Supervision   General bed mobility comments: HOB up    Transfers   Equipment used: 1 person hand held assist;None Transfers: Sit to/from Stand Sit to Stand: Min assist         General transfer comment: min to min guard     Balance Overall balance assessment: Needs assistance   Sitting balance-[name]: Good     Standing balance support: No upper extremity supported;Single extremity supported Standing balance-[name]: Fair                             ADL either performed or assessed with clinical judgement   ADL Overall ADL's : Needs assistance/impaired     Grooming: Wash/dry hands;Min guard;Standing Grooming Details (indicate cue type and reason): washed hands at sink following pill box test                             Functional mobility during ADLs: Minimal assistance       Vision       Perception     Praxis      Cognition Arousal/Alertness: Awake/alert Behavior During Therapy: WFL for tasks assessed/performed;Impulsive                                   General Comments: Pt administered pill box test. Noted to make 2 errors, but self corrected. Pt completed task in 6 minutes. She initially dumped "pills" into first container, but learned that was not optimal and poured them into her hand for subsequent bottles.        Exercises     Shoulder Instructions       General Comments      Pertinent Vitals/ Pain       Pain Assessment: Faces  Faces Pain Scale: Hurts little more Pain Location: throat Pain Descriptors / Indicators: Sore Pain Intervention(s): Monitored during session  Home Living                                          Prior Functioning/Environment              Frequency  Min 2X/week        Progress Toward Goals  OT Goals(current goals can now be found in the care plan section)  Progress towards OT goals: Progressing toward goals  Acute Rehab OT Goals Patient Stated Goal: to breathe better OT Goal Formulation: With patient Time For Goal Achievement: 10/09/21 Potential to Achieve Goals: Good  Plan Discharge plan remains appropriate    Co-evaluation                 AM-PAC OT  "6 Clicks" Daily Activity     Outcome Measure   Help from another person eating meals?: None Help from another person taking care of personal grooming?: A Little Help from another person toileting, which includes using toliet, bedpan, or urinal?: A Little Help from another person bathing (including washing, rinsing, drying)?: A Little Help from another person to put on and taking off regular upper body clothing?: A Little Help from another person to put on and taking off regular lower body clothing?: A Little 6 Click Score: 19    End of Session Equipment Utilized During Treatment: Gait belt  OT Visit Diagnosis: Other abnormalities of gait and mobility (R26.89);Other symptoms and signs involving cognitive function   Activity Tolerance Patient tolerated treatment well   Patient Left in bed;with call bell/phone within reach   Nurse Communication Other (comment) (pt wants saline nose spray)        Time: [id number] OT Time Calculation (min): 18 min  Charges: OT General Charges $OT Visit: 1 Visit OT Treatments $Self Care/Home Management : 8-22 mins  Nestor Lewandowsky, OTR/L Acute Rehabilitation Services Pager: [phone] Office: [phone]   Malka So 09/29/2021, 11:19 AM

## 2021-09-29 NOTE — Plan of Care (Signed)
Care plan met 

## 2021-09-29 NOTE — TOC Initial Note (Signed)
Transition of Care [facility]) - Initial/Assessment Note    Patient Details  Name: Maureen Jones MRN: [id number] Date of Birth: Aug 13, 1966  [facility] CM/SW Contact:    Carles Collet, RN Phone Number: 09/29/2021, 4:20 PM  Clinical Narrative:     Spoke w patient at bedside regarding discharge plan. She states that she lives at home w her sister and family.   She has a CPAP, she may need a nebulizer, and oxygen (currently on 1L) for discharge. We discussed that these can be ordered for her before she leaves if she needs them to go home with. She declined RW and 3/1.   We discussed home health services, and recommendations for PT OT SLP. [facility] PT can follow for oxygen use, oral medication compliance, vital signs, and physical therapy). She is agreeable to these services. Discussed Medicare ratings and she is agreeable to referral through [facility] who can accept. Patient will need HH PT OT SLP order with [facility].       TOC will continue to follow for DC needs including O2 and nebulizer.            Expected Discharge Plan: [place] Barriers to Discharge: Continued Medical Work up   Patient Goals and CMS Choice Patient states their goals for this hospitalization and ongoing recovery are:: to go home CMS Medicare.gov Compare Post Acute Care list provided to:: Patient Choice offered to / list presented to : Patient  Expected Discharge Plan and Services Expected Discharge Plan: [place]   Discharge Planning Services: CM Consult Post Acute Care Choice: [place] arrangements for the past 2 months: Mobile Home                           HH Arranged: PT, OT, Speech Therapy [place] Agency: [place] Date [place]: 09/29/21 Time [place]: 1620 Representative spoke with at [place]: Tommi Rumps  Prior Living Arrangements/Services Living arrangements for the past 2 months: Mobile Home Lives with:: Relatives    Do you feel safe going back to the place where you live?: Yes          Current home services: DME    Activities of Daily Living      Permission Sought/Granted                  Emotional Assessment              Admission diagnosis:  Encephalopathy acute [G93.40] Acute encephalopathy [G93.40] Patient Active Problem List   Diagnosis Date Noted   Endotracheal tube present    Encephalopathy acute 09/20/2021   PCP:  Pcp, No Pharmacy:   [id number] [address]Phone:[phone]:[phone]    Social Determinants of Health (SDOH) Interventions    Readmission Risk Interventions No flowsheet data found.

## 2021-09-29 NOTE — Progress Notes (Signed)
Modified Barium Swallow Progress Note  Patient Details  Name: Maureen Jones MRN: [id number] Date of Birth: 04/17/1966  Today's Date: 09/29/2021  Modified Barium Swallow completed.  Full report located under Chart Review in the Imaging Section.  Brief recommendations include the following:  Clinical Impression Pt presents with mild oropharyngeal dysphagia, characterized as follows: Orally, pt manages all textures well. There was a trace amount of posterior spillage of oral residue after the initial bolus of thin liquid, which spilled to the vallecular sinus. This was not replicated on other consistencies.   Pharyngeal swallow is characterized by trigger of the swallow reflex at the vallecular sinus across consistencies. There was no post-swallow pharyngeal residue after any presentation. Very trace penetration was seen intermittently during the swallow of thin liquids. Penetrate cleared spontaneously and was not aspirated. This occurred more on when pt was drinking from a straw. No aspiration of any consistency was seen.   Recommend continuing regular diet with thin liquids, but avoid straw use. Meds whole with liquid. Safe swallow precautions were updated and sent with transport to post at [facility]. SLP will continue to follow to assess diet tolerance and provide education. RN and MD informed of results and recommendations.    Swallow Evaluation Recommendations  SLP Diet Recommendations: Regular solids;Thin liquid   Liquid Administration via: No straw;Cup   Medication Administration: Whole meds with liquid   Supervision: Patient able to self feed   Compensations: Slow rate;Small sips/bites   Postural Changes: Remain semi-upright after after feeds/meals (Comment);Seated upright at 90 degrees   Oral Care Recommendations: Oral care BID      Hadlei Stitt B. Quentin Ore, [facility], [place] Speech Language Pathologist Office: [phone]  Shonna Chock 09/29/2021,1:45 PM

## 2021-09-29 NOTE — Progress Notes (Signed)
Speech Language Pathology Treatment: Dysphagia  Patient Details Name: Maureen Jones MRN: [id number] DOB: 1966-06-16 Today's Date: 09/29/2021 Time: [id number] SLP Time Calculation (min) (ACUTE ONLY): 19 min  Assessment / Plan / Recommendation Clinical Impression  Pt has had a decline in status since seen on 10/16.  Pt with raspy breath sounds on arrival. Pt's vocal quality remains hoarse.  She can attain some phonation.  Pt reports she is having trouble breathing.  ENT saw pt yesterday and completed laryngoscopy with no evidence of VF paralysis, but reports risk of subglottic stenosis.  Today pt tolerated regular solid cracker without any clinical s/s of aspiration but there was wet cough on 2/3 trials.  With trials of nectar thick, pt exhibited better tolerance (throat clear x1) and reported subjective improvement in symptoms with thicker consistency.  Pt says she was told she has pneumonia.  Recommend changing diet to regular solids with NECTAR thick liquid with MBSS to follow for further evaluation of pharyngeal swallow function.     HPI HPI: 55 year old female admitted with AMS 10/10, intubated, stroke w/u negative. Diagnosed with acute metabolic encephalopathy in the setting of hypertensive emergency, possible PRES. Extubated but reintubated 10/12 with increased WOB, not mobilizing secretions, stridor, received steroids, NO laryngeal edema noted. Extubated 10/14 with lingering stridor, improving 10/15 am. ENT scoped pt 10/18: "Edema of the glottis and subglottic larynx following recent intubation and then repeat intubation.  There is no evidence of cord paralysis or any tumor present.  There is currently no stridor.  Allow more time for resolution of the swelling.  Unfortunately she is at risk for developing subglottic stenosis."      SLP Plan  MBS      Recommendations for follow up therapy are one component of a multi-disciplinary discharge planning process, led by the attending  physician.  Recommendations may be updated based on patient status, additional functional criteria and insurance authorization.    Recommendations  Diet recommendations: Regular;Nectar-thick liquid Liquids provided via: Cup;Straw Medication Administration: Whole meds with liquid Supervision: Patient able to self feed Compensations: Slow rate;Small sips/bites Postural Changes and/or Swallow Maneuvers: Seated upright 90 degrees                Oral Care Recommendations: Oral care BID Follow up Recommendations:  (TBD) SLP Visit Diagnosis: Dysphagia, pharyngeal phase (R13.13) Plan: MBS       GO                Celedonio Savage, MA, [place] Office: [phone]  09/29/2021, 10:25 AM

## 2021-09-30 ENCOUNTER — Inpatient Hospital Stay (HOSPITAL_COMMUNITY): Payer: Medicare Other

## 2021-09-30 DIAGNOSIS — J4551 Severe persistent asthma with (acute) exacerbation: Secondary | ICD-10-CM | POA: Diagnosis not present

## 2021-09-30 DIAGNOSIS — J384 Edema of larynx: Secondary | ICD-10-CM | POA: Diagnosis not present

## 2021-09-30 DIAGNOSIS — G9340 Encephalopathy, unspecified: Secondary | ICD-10-CM | POA: Diagnosis not present

## 2021-09-30 DIAGNOSIS — J189 Pneumonia, unspecified organism: Secondary | ICD-10-CM | POA: Diagnosis not present

## 2021-09-30 DIAGNOSIS — J9601 Acute respiratory failure with hypoxia: Secondary | ICD-10-CM | POA: Diagnosis not present

## 2021-09-30 LAB — CBC
HCT: 33.1 % — ABNORMAL LOW (ref 36.0–46.0)
Hemoglobin: 10.5 g/dL — ABNORMAL LOW (ref 12.0–15.0)
MCH: 29.2 pg (ref 26.0–34.0)
MCHC: 31.7 g/dL (ref 30.0–36.0)
MCV: 92.2 fL (ref 80.0–100.0)
Platelets: 297 10*3/uL (ref 150–400)
RBC: 3.59 MIL/uL — ABNORMAL LOW (ref 3.87–5.11)
RDW: 13.3 % (ref 11.5–15.5)
WBC: 23.9 10*3/uL — ABNORMAL HIGH (ref 4.0–10.5)
nRBC: 0 % (ref 0.0–0.2)

## 2021-09-30 LAB — BASIC METABOLIC PANEL
Anion gap: 8 (ref 5–15)
BUN: 62 mg/dL — ABNORMAL HIGH (ref 6–20)
CO2: 20 mmol/L — ABNORMAL LOW (ref 22–32)
Calcium: 9.6 mg/dL (ref 8.9–10.3)
Chloride: 109 mmol/L (ref 98–111)
Creatinine, Ser: 2.01 mg/dL — ABNORMAL HIGH (ref 0.44–1.00)
GFR, Estimated: 29 mL/min — ABNORMAL LOW (ref >60)
Glucose, Bld: 217 mg/dL — ABNORMAL HIGH (ref 70–99)
Potassium: 4.7 mmol/L (ref 3.5–5.1)
Sodium: 137 mmol/L (ref 135–145)

## 2021-09-30 LAB — GLUCOSE, CAPILLARY
Glucose-Capillary: 148 mg/dL — ABNORMAL HIGH (ref 70–99)
Glucose-Capillary: 170 mg/dL — ABNORMAL HIGH (ref 70–99)
Glucose-Capillary: 220 mg/dL — ABNORMAL HIGH (ref 70–99)
Glucose-Capillary: 220 mg/dL — ABNORMAL HIGH (ref 70–99)
Glucose-Capillary: 237 mg/dL — ABNORMAL HIGH (ref 70–99)
Glucose-Capillary: 249 mg/dL — ABNORMAL HIGH (ref 70–99)
Glucose-Capillary: 296 mg/dL — ABNORMAL HIGH (ref 70–99)

## 2021-09-30 MED ORDER — FUROSEMIDE 10 MG/ML IJ SOLN
60.0000 mg | Freq: Once | INTRAMUSCULAR | Status: AC
  Administered 2021-09-30: 60 mg via INTRAVENOUS
  Filled 2021-09-30: qty 6

## 2021-09-30 MED ORDER — IPRATROPIUM-ALBUTEROL 0.5-2.5 (3) MG/3ML IN SOLN
3.0000 mL | RESPIRATORY_TRACT | Status: DC
  Administered 2021-09-30 – 2021-10-01 (×5): 3 mL via RESPIRATORY_TRACT
  Filled 2021-09-30 (×5): qty 3

## 2021-09-30 MED ORDER — LORAZEPAM 2 MG/ML IJ SOLN
2.0000 mg | Freq: Once | INTRAMUSCULAR | Status: AC
  Administered 2021-09-30: 2 mg via INTRAVENOUS
  Filled 2021-09-30: qty 1

## 2021-09-30 MED ORDER — LORAZEPAM 2 MG/ML IJ SOLN
2.0000 mg | Freq: Four times a day (QID) | INTRAMUSCULAR | Status: DC | PRN
  Administered 2021-10-01: 2 mg via INTRAVENOUS
  Filled 2021-09-30: qty 1

## 2021-09-30 MED ORDER — INSULIN ASPART 100 UNIT/ML IJ SOLN: 5.0000 [IU] | Freq: Once | INTRAMUSCULAR | Status: DC

## 2021-09-30 MED ORDER — INSULIN ASPART 100 UNIT/ML IJ SOLN
7.0000 [IU] | Freq: Once | INTRAMUSCULAR | Status: AC
  Administered 2021-09-30: 7 [IU] via SUBCUTANEOUS

## 2021-09-30 MED ORDER — ALBUTEROL (5 MG/ML) CONTINUOUS INHALATION SOLN
10.0000 mg/h | INHALATION_SOLUTION | RESPIRATORY_TRACT | Status: AC
  Administered 2021-09-30: 10 mg/h via RESPIRATORY_TRACT
  Filled 2021-09-30 (×3): qty 0.5

## 2021-09-30 MED ORDER — METHYLPREDNISOLONE SODIUM SUCC 125 MG IJ SOLR
125.0000 mg | Freq: Every day | INTRAMUSCULAR | Status: DC
  Administered 2021-09-30 – 2021-10-03 (×4): 125 mg via INTRAVENOUS
  Filled 2021-09-30 (×4): qty 2

## 2021-09-30 MED ORDER — MOMETASONE FURO-FORMOTEROL FUM 200-5 MCG/ACT IN AERO
2.0000 | INHALATION_SPRAY | Freq: Two times a day (BID) | RESPIRATORY_TRACT | Status: DC
  Administered 2021-10-01 – 2021-10-04 (×8): 2 via RESPIRATORY_TRACT
  Filled 2021-09-30: qty 8.8

## 2021-09-30 MED ORDER — HALOPERIDOL LACTATE 5 MG/ML IJ SOLN
5.0000 mg | Freq: Once | INTRAMUSCULAR | Status: AC
  Administered 2021-09-30: 5 mg via INTRAVENOUS

## 2021-09-30 NOTE — Progress Notes (Signed)
PT Cancellation Note  Patient Details Name: Maureen Jones MRN: [id number] DOB: 29-Oct-1966   Cancelled Treatment:    Reason Eval/Treat Not Completed: Patient not medically ready at this time. Per RN, pt with increased work of breathing overnight, needing bipap today as well as multiple doses of ativan to maintain respiratory stability. Will continue to follow and progress as pt able.   [place] Carbo, PT, DPT   Acute Rehabilitation Department Pager #: [phone]   Sandra Cockayne 09/30/2021, 3:30 PM

## 2021-09-30 NOTE — Progress Notes (Signed)
PROGRESS NOTE    Maureen Jones  [id number] DOB: September 15, 1966 DOA: 09/20/2021 PCP: Pcp, No   Brief Narrative: 55 year old with past medical history significant for hypertension and diabetes who presented to [facility] on 10/10 with acute altered mental status.  She was reportedly at home and went to smoke a cigarette at 2 AM with her nephew.  She was apparently seen by her family at that time.  After smoking, the family heard a loud noise and she was found altered and confused.  On arrival to the ER she was noted to be agitated, confused.  She was seen initially as a code stroke.  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  She was hypertensive with initial pressure of 240/ 112.  CBG 437, creatinine 2.1, ammonia 30, white blood cell 14, UDS was negative.  Chest x-ray showed low lung volume.  Patient was intubated on admission for acute encephalopathy.  On 10/12 patient was extubated, due to increased work of breathing she was reintubated.  On 10/14 she was extubated, she had some stridors, received racemic epi, she was transiently on Precedex for agitation.  She has remained stable and she was transitioned to [facility] on 10/19.  He was evaluated by ENT who recommended treatment for reflux, patient has some mild edema of the glottis and supraglottic larynx post intubation.   Assessment & Plan:   Active Problems:   Encephalopathy acute   Endotracheal tube present  1-Acute hypoxic respiratory failure: -Initially admitted for altered mental status, airway protection. -Previously intubated 2 times during this admission, second time due to her stridor, but no significant laryngeal edema noted during reintubation.  Extubated on 10/14 with ongoing hoarseness.  ENT consulted.  Mild edema glottis and supraglottic larynx.  ENT recommended PPI and follow-up as an outpatient.  Patient is at risk for developing subglottic stenosis. -Chest x-ray  concerning for right middle lobe and lower lobe pneumonia.  Plan to continue with IV antibiotics.  Need at least 7 days. -On Daily Claritin and Singulair. -She has history of asthma, she would need at discharge Dulera, Spiriva, Worsening respiratory distress. Back on BIPAP, IV steroids. Appreciate CM eval.  -Chest x ray vascular congestion. Started on IV lasix.  -ECHO NL ef, Diastolic parameter indeterminate.   2-Hoarseness: Persistent postextubation due to subglottic edema. ENT consulted recommended aggressive care for GERD. Protonix twice daily Needs to  follow-up with ENT as an outpatient.  Acute hypertensive emergency: Initial blood pressure 240/112 ECHO; Moderate left ventricular hypertrophy Continue with amlodipine, Coreg,. Hold cozaar.  Start scheduled hydralazine  Acute metabolic encephalopathy due to hypertensive emergency: CT head negative MRI negative. sundown at night. Continue with Seroquel She has required ativan for agitation.   Dysphagia: Evaluated by speech today who recommended regular solids and thin liquids diet. Leukocytosis : Suspect related to steroid.  On IV antibiotics as well.  AKI: Improved monitor. Cr: 1.9.  Peak to 2.4. Hold Cozaar.  Diabetes uncontrolled hyperglycemia: Continue with Levemir 25 units twice daily.   meal coverage.  Anemia of critical illness  Nutrition Problem: Inadequate oral intake Etiology: inability to eat    Signs/Symptoms: NPO status    Interventions: Ensure Enlive (each supplement provides 350kcal and 20 grams of protein), MVI  Estimated body mass index is 38.18 kg/m as calculated from the following:   Height as of this encounter: '5\' 6"'$  (1.676 m).   Weight as of this encounter: 107.3 kg.   DVT prophylaxis:  Heparin Code Status: Full code Family Communication: Disposition Plan:  Status is: Inpatient  Remains inpatient appropriate because: Uncontrolled hypertension, respiratory failure,  hyperglycemia        Consultants:  CM admitted patient ENT  Procedures:    Antimicrobials:    Subjective: Patient on BIPAP, sleepy, received ativan for agitation.   Objective: Vitals:   09/30/21 1539 09/30/21 1556 09/30/21 1600 09/30/21 1605  BP:   (!) 164/87 (!) 164/87  Pulse: 89  87 84  Resp: (!) 21  (!) 21   Temp:  98.3 F (36.8 C)    TempSrc:  Axillary    SpO2: 100%  100%   Weight:      Height:        Intake/Output Summary (Last 24 hours) at 09/30/2021 1643 Last data filed at 09/30/2021 1600 Gross per 24 hour  Intake 780.13 ml  Output 2200 ml  Net -1419.87 ml    Filed Weights   09/28/21 0500 09/29/21 0500 09/30/21 0500  Weight: 106.6 kg 106.1 kg 107.3 kg    Examination:  General exam: On BIPAP/ Respiratory system: Resp distress, BL wheezing.  Cardiovascular system: S 1, S 2 RRR Gastrointestinal system: BS present, soft, nt Central nervous system: sleepy Extremities: no edema  Data Reviewed: I have personally reviewed following labs and imaging studies  CBC: Recent Labs  Lab 09/25/21 0316 09/26/21 0244 09/27/21 0448 09/27/21 0724 09/29/21 0158 09/30/21 0353  WBC 21.1* 22.7* 21.9*  --  20.4* 23.9*  HGB 11.5* 10.7* 10.7* 10.5* 10.8* 10.5*  HCT 37.3 35.5* 34.4* 31.0* 34.3* 33.1*  MCV 93.3 94.7 91.7  --  90.7 92.2  PLT 216 266 303  --  308 [id number]    Basic Metabolic Panel: Recent Labs  Lab 09/25/21 0316 09/26/21 0244 09/27/21 0448 09/27/21 0724 09/29/21 0158 09/30/21 0353  NA 147* 141 138 142 132* 137  K 4.1 4.4 4.1 4.1 4.6 4.7  CL 113* 111 109  --  105 109  CO2 26 21* 24  --  19* 20*  GLUCOSE 94 307* 138*  --  206* 217*  BUN 42* 55* 55*  --  63* 62*  CREATININE 2.19* 2.45* 1.99*  --  1.99* 2.01*  CALCIUM 9.6 9.5 9.7  --  9.3 9.6  MG 2.4 2.3 2.2  --   --   --   PHOS 3.8 3.5 3.2  --   --   --     GFR: Estimated Creatinine Clearance: 39.7 mL/min (A) (by C-G formula based on SCr of 2.01 mg/dL (H)). Liver Function Tests: No  results for input(s): AST, ALT, ALKPHOS, BILITOT, PROT, ALBUMIN in the last 168 hours. No results for input(s): LIPASE, AMYLASE in the last 168 hours. No results for input(s): AMMONIA in the last 168 hours. Coagulation Profile: No results for input(s): INR, PROTIME in the last 168 hours. Cardiac Enzymes: No results for input(s): CKTOTAL, CKMB, CKMBINDEX, TROPONINI in the last 168 hours. BNP (last 3 results) No results for input(s): PROBNP in the last 8760 hours. HbA1C: No results for input(s): HGBA1C in the last 72 hours. CBG: Recent Labs  Lab 09/30/21 0222 09/30/21 0329 09/30/21 0635 09/30/21 1116 09/30/21 1558  GLUCAP 249* 220* 148* 170* 237*    Lipid Profile: No results for input(s): CHOL, HDL, LDLCALC, TRIG, CHOLHDL, LDLDIRECT in the last 72 hours. Thyroid Function Tests: No results for input(s): TSH, T4TOTAL, FREET4, T3FREE, THYROIDAB in the last 72 hours. Anemia Panel: No results for input(s): VITAMINB12, FOLATE, FERRITIN, TIBC, IRON, RETICCTPCT  in the last 72 hours. Sepsis Labs: No results for input(s): PROCALCITON, LATICACIDVEN in the last 168 hours.  Recent Results (from the past 240 hour(s))  Culture, Respiratory w Gram Stain     Status: None   Collection Time: 09/24/21  9:48 AM   Specimen: Tracheal Aspirate; Respiratory  Result Value Ref Range Status   Specimen Description TRACHEAL ASPIRATE  Final   Special Requests NONE  Final   Gram Stain   Final    FEW WBC PRESENT, PREDOMINANTLY PMN NO ORGANISMS SEEN    Culture   Final    RARE Normal respiratory flora-no Staph aureus or Pseudomonas seen Performed at [facility], [address], [place], [place] [address]    Report Status 09/26/2021 FINAL  Final  MRSA Next Gen by PCR, Nasal     Status: None   Collection Time: 09/27/21  7:50 AM   Specimen: Nasal Mucosa; Nasal Swab  Result Value Ref Range Status   MRSA by PCR Next Gen NOT DETECTED NOT DETECTED Final    Comment: (NOTE) The GeneXpert MRSA Assay  (FDA approved for NASAL specimens only), is one component of a comprehensive MRSA colonization surveillance program. It is not intended to diagnose MRSA infection nor to guide or monitor treatment for MRSA infections. Test performance is not FDA approved in patients less than 59 years old. Performed at [facility], [place] [address], [place], [place] [address]   Culture, Respiratory w Gram Stain     Status: None   Collection Time: 09/27/21  2:22 PM   Specimen: Tracheal Aspirate; Respiratory  Result Value Ref Range Status   Specimen Description TRACHEAL ASPIRATE  Final   Special Requests NONE  Final   Gram Stain   Final    ABUNDANT WBC PRESENT,BOTH PMN AND MONONUCLEAR ABUNDANT GRAM NEGATIVE RODS MODERATE GRAM POSITIVE COCCI    Culture   Final    ABUNDANT Normal respiratory flora-no Staph aureus or Pseudomonas seen Performed at [facility], [address] [place], [place] [address]    Report Status 09/29/2021 FINAL  Final          Radiology Studies: DG CHEST PORT 1 VIEW  Result Date: 09/30/2021 CLINICAL DATA:  Shortness of breath EXAM: PORTABLE CHEST 1 VIEW COMPARISON:  Radiograph 09/27/2021 FINDINGS: Unchanged, enlarged cardiomediastinal silhouette. There is central pulmonary vascular congestion. There is no large pleural effusion or visible pneumothorax. Low lung volumes. No acute osseous abnormality. IMPRESSION: Unchanged cardiomegaly with central pulmonary vascular congestion. Unchanged low lung volumes. Electronically Signed   By: Maurine Simmering M.D.   On: 09/30/2021 10:29   DG Swallowing Func-Speech Pathology  Result Date: 09/29/2021 Table formatting from the original result was not included. Objective Swallowing Evaluation: Type of Study: MBS-Modified Barium Swallow Study  Patient Details Name: Zylie Erazo MRN: [id number] Date of Birth: 02-13-1966 Today's Date: 09/29/2021 Time: SLP Start Time (ACUTE ONLY): 1300 -SLP Stop Time (ACUTE ONLY): 1315 SLP Time  Calculation (min) (ACUTE ONLY): 15 min Past Medical History: Past Medical History: Diagnosis Date  Asthma   Diabetes ([place])   Glottic and subglottic edema 2022  Hypertension  Past Surgical History: No past surgical history on file. HPI: 55 year old female admitted with AMS 10/10, intubated, stroke w/u negative. Diagnosed with acute metabolic encephalopathy in the setting of hypertensive emergency, possible PRES. Extubated but reintubated 10/12 with increased WOB, not mobilizing secretions, stridor, received steroids, NO laryngeal edema noted. Extubated 10/14 with lingering stridor, improving 10/15 am. ENT scoped  pt 10/18: "Edema of the glottis and subglottic larynx following recent intubation and then repeat intubation.  There is no evidence of cord paralysis or any tumor present.  There is currently no stridor.  Allow more time for resolution of the swelling.  Unfortunately she is at risk for developing subglottic stenosis."  Subjective: Pt seen in radiology. No family present Assessment / Plan / Recommendation CHL IP CLINICAL IMPRESSIONS 09/29/2021 Clinical Impression Pt presents with mild oropharyngeal dysphagia, characterized as follows: Orally, pt manages all textures well. There was a trace amount of posterior spillage of oral residue after the initial bolus of thin liquid, which spilled to the vallecular sinus. This was not replicated on other consistencies. Pharyngeal swallow is characterized by trigger of the swallow reflex at the vallecular sinus across consistencies. There was no post-swallow pharyngeal residue after any presentation. Very trace penetration was seen intermittently during the swallow of thin liquids. Penetrate cleared spontaneously and was not aspirated. This occurred more on when pt was drinking from a straw. No aspiration of any consistency was seen. Recommend continuing regular diet with thin liquids, but avoid straw use. Meds whole with liquid. Safe swallow precautions were updated and  sent with transport to post [facility]. SLP will continue to follow to assess diet tolerance and provide education. RN and MD informed of results and recommendations.  SLP Visit Diagnosis Dysphagia, oropharyngeal phase (R13.12)     Impact on safety and function Mild aspiration risk   CHL IP TREATMENT RECOMMENDATION 09/29/2021 Treatment Recommendations Therapy as outlined in treatment plan below   Prognosis 09/29/2021 Prognosis for Safe Diet Advancement Good     CHL IP DIET RECOMMENDATION 09/29/2021 SLP Diet Recommendations Regular solids;Thin liquid Liquid Administration via No straw;Cup Medication Administration Whole meds with liquid Compensations Slow rate;Small sips/bites Postural Changes Remain semi-upright after after feeds/meals (Comment);Seated upright at 90 degrees   CHL IP OTHER RECOMMENDATIONS 09/29/2021   Oral Care Recommendations Oral care BID     CHL IP FOLLOW UP RECOMMENDATIONS 09/29/2021 Follow up Recommendations TBD   CHL IP FREQUENCY AND DURATION 09/29/2021 Speech Therapy Frequency (ACUTE ONLY) min 2x/week Treatment Duration 1 week      CHL IP ORAL PHASE 09/29/2021 Oral Phase Impaired Posterior spillage of thin liquids after the initial swallow     CHL IP PHARYNGEAL PHASE 09/29/2021 Pharyngeal Phase Impaired Trigger at Vallecular sinus Trace penetration seen intermittently during straw drinking thin liquids  CHL IP CERVICAL ESOPHAGEAL PHASE 09/29/2021 Cervical Esophageal Phase [facility]  Celia B. Bueche, [place], [place] Speech Language Pathologist Office: [phone] Shonna Chock 09/29/2021, 1:41 PM                   Scheduled Meds:  amLODipine  10 mg Oral Daily   carvedilol  25 mg Oral BID WC   chlorhexidine  15 mL Mouth Rinse BID   Chlorhexidine Gluconate Cloth  6 each Topical Daily   feeding supplement  237 mL Oral BID BM   furosemide  60 mg Intravenous Once   guaiFENesin  1,200 mg Oral BID   heparin  5,000 Units Subcutaneous Q8H   hydrALAZINE  25 mg Oral Q8H   insulin aspart  0-15  Units Subcutaneous TID WC   insulin aspart  3 Units Subcutaneous TID WC   insulin detemir  25 Units Subcutaneous BID   ipratropium-albuterol  3 mL Nebulization Q4H   loratadine  10 mg Oral Daily   mouth rinse  15 mL Mouth Rinse q12n4p   methylPREDNISolone (SOLU-MEDROL) injection  125 mg Intravenous Daily   mometasone-formoterol  2 puff Inhalation BID   montelukast  10 mg Oral QHS   pantoprazole (PROTONIX) IV  40 mg Intravenous Q12H   QUEtiapine  50 mg Oral QHS   sodium chloride  2 spray Each Nare Q2000   Continuous Infusions:  ceFEPime (MAXIPIME) IV 200 mL/hr at 09/30/21 1400     LOS: 10 days    Time spent: 35 minutes   Aleeah Greeno A Cliffard Hair, MD [facility]   If 7PM-7AM, please contact night-coverage [email or web address]  09/30/2021, 4:43 PM

## 2021-09-30 NOTE — Progress Notes (Signed)
NAMEHau Jones, MRN:  [id number], DOB:  [birth year], LOS: 48 ADMISSION DATE:  09/20/2021, CONSULTATION DATE: 10/10 REFERRING MD:  Dr. Laverta Jones, CHIEF COMPLAINT:  AMS   History of Present Illness:  55 y/o F who presented to [facility] on 10/10 with reports of acute altered mental status.   She was reportedly at home and went to smoke a cigarette 0200 with her nephew. She was apparently seen by her family at that time.  After smoking, the family heard a loud thud and she was found altered and confused.  On arrival to the ER she was noted to be agitated, confused, localized to pain and stated "that hurts" with stimulation.  She was seen initially as a CODE STROKE.  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 She was hypertensive with initial pressure of 240/112 with MAP of 136.  Initial labs showed glucose of 437, BUN 36, Sr CR 2.17, CK 316, ammonia 30, Mg 1.9, WBC 14.4. UDS was negative.  COVID & flu screening were negative.  CXR showed low lung volumes, ETT in good position, L>R interstitial opacities.    PCCM called for ICU admission.   Pertinent  Medical History  HTN DM   Significant Hospital Events: Including procedures, antibiotic start and stop dates in addition to other pertinent events   10/10 Admit with acute encephalopathy, hypertensive 10/12 extubated, increased WOB, re-intubated 10/14 extubated , lingering stridor, Received racemic epi x2 over the last 24 hours + another dose of Decadron, Mild agitation requiring restart of Precedex 10/15 swallow evaluation >> dysphagia 3 diet  Interim History / Subjective:  Anxiety this morning, agitated overnight due to SOB.   Objective   Blood pressure (!) 143/75, pulse 64, temperature 98.1 F (36.7 C), temperature source Axillary, resp. rate 17, height '5\' 6"'$  (1.676 m), weight 107.3 kg, SpO2 100 %.    Vent Mode: BIPAP;PCV FiO2 (%):  [50 %] 50 % Set Rate:  [15 bmp] 15 bmp PEEP:  [5  cmH20] 5 cmH20   Intake/Output Summary (Last 24 hours) at 09/30/2021 [id number] Last data filed at 09/30/2021 0600 Gross per 24 hour  Intake 1300 ml  Output 2100 ml  Net -800 ml    Filed Weights   09/28/21 0500 09/29/21 0500 09/30/21 0500  Weight: 106.6 kg 106.1 kg 107.3 kg    Examination: General: middle aged woman sitting up in bed in BiPAP, agitated but redirectable, mild respiratory distress HENT: [place]/AT, eyes anicteric Neck: expiratory course breath sounds over neck, no inspiratory stridor Lungs: rhonchi, wheezing bilaterally, tachypnea, abdominal expiratory accessory muscle use. Able to control breathing with coaching. Cardiovascular: S1S2, tachycardic, reg rhythm Abdomen: obese, soft, NT Extremities: no peripheral edema, no cyanosis or clubbing Neuro: awake, agitated, able to answer questions and communicate, moving all extremities spontaneously.  Resp culture > abundant GNR, mod GPC> normal flora WBC 23.9 BUN 62 Cr 2.01 Bicarb 20   Resolved Hospital Problem list     Assessment & Plan:   Acute respiratory failure with hypoxia RML and LLL pneumonia.  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  Extubated on 10/14 with ongoing hoarseness. Now confirmed glottic and subglottic edema when ENT scoped her. -Back on bipap this morning. Ok to give ativan to help relieve dyspnea while we are treating acute  episode of respiratory distress. -4h CAT -schedled duonebs Q4h today -Repeat sputum culture. Keep cefepime. Previously had 7 days of ceftriaxone. -Con't LABA/ICS--Dulera here, increase to high dose. At discharge can do Dulera, Spiriva, Advair, [place]-- whichever is least expensive. -Failed transition to PO steroids today. Switch back to IV. -Con't claritin and  singulair -CXR today -lasix today -repeat sputum if able to collect sample -I worry that she has poor insight into her symptom burden, which is dangerous as an asthmatic, especially with high risk of developing subglottic stenosis. These symptoms could be very similar and hard to differentiate which disease process is worsening.  Hoarseness persistent post-extubation due to subglottic edema -Appreciate ENT's management> con't aggressive GERD treatment. Dr. Constance Jones will follow her as an outpatient as well.  -con't to monitor  Acute hypertensive emergency > now just hypertensive Initial BP 240/112, MAP 136  Echo moderate LVH -On amlodipine 10 mg daily, coreg 25 mg BID, hydralazine 100 mg TID, losartan -management per primary  Acute Metabolic Encephalopathy due to hypertensive emergency. Imaging negative for PRES. CT head negative. MRI negative. Has had sundowning at night this admission, even nights without respiratory distress -seroquel '50mg'$  QHS; haldol PRN.   AKI, improving 10/2020 cr 1.7. Cr improved after gentle hydration 10/11. -per primary  DM with uncontrolled hyperglycemia -per primary  Acute anemia due to critical illness -per primary  PCCM will continue to follow.  Best Practice (right click and "Reselect all SmartList Selections" daily)  Per primary  Labs   CBC: Recent Labs  Lab 09/25/21 0316 09/26/21 0244 09/27/21 0448 09/27/21 0724 09/29/21 0158 09/30/21 0353  WBC 21.1* 22.7* 21.9*  --  20.4* 23.9*  HGB 11.5* 10.7* 10.7* 10.5* 10.8* 10.5*  HCT 37.3 35.5* 34.4* 31.0* 34.3* 33.1*  MCV 93.3 94.7 91.7  --  90.7 92.2  PLT 216 266 303  --  308 297     Basic Metabolic Panel: Recent Labs  Lab 09/25/21 0316 09/26/21 0244 09/27/21 0448 09/27/21 0724 09/29/21 0158 09/30/21 0353  NA 147* 141 138 142 132* 137  K 4.1 4.4 4.1 4.1 4.6 4.7  CL 113* 111 109  --  105 109  CO2 26 21* 24  --  19* 20*  GLUCOSE 94 307* 138*  --  206* 217*  BUN 42* 55* 55*  --  63*  62*  CREATININE 2.19* 2.45* 1.99*  --  1.99* 2.01*  CALCIUM 9.6 9.5 9.7  --  9.3 9.6  MG 2.4 2.3 2.2  --   --   --   PHOS 3.8 3.5 3.2  --   --   --     GFR: Estimated Creatinine Clearance: 39.7 mL/min (A) (by C-G formula based on SCr of 2.01 mg/dL (H)). Recent Labs  Lab 09/26/21 0244 09/27/21 0448 09/29/21 0158 09/30/21 0353  WBC 22.7* 21.9* 20.4* 23.9*     Liver Function Tests: No results for input(s): AST, ALT, ALKPHOS, BILITOT, PROT, ALBUMIN in the last 168 hours.  No results for input(s): LIPASE, AMYLASE in the last 168 hours. No results for input(s): AMMONIA in the last 168 hours.   ABG    Component Value Date/Time   PHART 7.345 (L) 09/27/2021 0724   PCO2ART 47.0 09/27/2021 0724   PO2ART 73 (L) 09/27/2021 0724   HCO3 25.7 09/27/2021 0724   TCO2 27 09/27/2021 0724   O2SAT 94.0 09/27/2021 0724    This patient is critically ill with multiple organ system failure which requires frequent high complexity decision making, assessment, support,  evaluation, and titration of therapies. This was completed through the application of advanced monitoring technologies and extensive interpretation of multiple databases. During this encounter critical care time was devoted to patient care services described in this note for 35 minutes.     Julian Hy, DO 09/30/21 7:28 AM [facility]

## 2021-09-30 NOTE — Progress Notes (Signed)
Nutrition Follow-up  DOCUMENTATION CODES:   Obesity unspecified  INTERVENTION:   Continue Ensure Enlive po BID, each supplement provides 350 kcal and 20 grams of protein  Multivitamin w/ minerals daily  NUTRITION DIAGNOSIS:   Inadequate oral intake related to inability to eat as evidenced by NPO status. - Progressing, pt now on Heart Healthy/Carb Modified diet  GOAL:   Patient will meet greater than or equal to 90% of their needs - Progressing  MONITOR:   PO intake, I & O's, Weight trends, Supplement acceptance  REASON FOR ASSESSMENT:   Consult, Ventilator Enteral/tube feeding initiation and management  ASSESSMENT:   Pt with PMH of DM, HTN, and smoking admitted with acute encephalopathy.  10/12 Extubated, Re-Intubated 10/14 Extubated 10/15 Diet advanced to Dysphagia 3 10/19 MBS - Diet advanced to Heart Healthy/ Carb Mod   Per nurse, pt will eat anything.  Per EMR, pt intake includes: 10/18: Breakfast 100%, Lunch 100%, Dinner 100% 10/19: Breakfast 100%, Lunch 100%, Dinner 100%  Pt currently on Bi-PAP, pt continue to be agitated and required ativan.   Received a consult for diet education, pt not appropriate due to current status. Left diet education at bedside, will continue to follow and provide education when appropriate.   Medications reviewed and include: SSI 0-15 units TID + 3 units TID, Levemir 25 units, Solu-Medrol, Protonix, IV antibiotics Labs reviewed: BUN 62, Creatinine 2.01, 24 hr BG trends 138-398  Admission Weight: 100 kg Current Weight: 107.3 kg  UOP: 2100 mL + 3 unmeasured occurrence x 24 hr I/O's: +4982 mL since admit  Diet Order:   Diet Order             Diet heart healthy/carb modified Room service appropriate? Yes; Fluid consistency: Thin  Diet effective now                   EDUCATION NEEDS:   Not appropriate for education at this time  Skin:  Skin Assessment: Reviewed RN Assessment  Last BM:  09/28/2021  Height:   Ht  Readings from Last 1 Encounters:  09/22/21 '5\' 6"'$  (1.676 m)    Weight:   Wt Readings from Last 1 Encounters:  09/30/21 107.3 kg    Ideal Body Weight:  59.1 kg  BMI:  Body mass index is 38.18 kg/m.  Estimated Nutritional Needs:   Kcal:  1700-1900  Protein:  90-110 grams  Fluid:  > 1.7 L/day    Maureen Jones BS, PLDN Clinical Dietitian See [facility] for contact information.

## 2021-09-30 NOTE — Progress Notes (Signed)
Pharmacy Antibiotic Note  Maureen Jones is a 55 y.o. female admitted on 09/20/2021 with hypertensive encephalopathy. Pt previously completed course of ceftriaxone. Pharmacy has been consulted for vancomycin and cefepime dosing with ongoing respiratory distress.  Vancomycin stopped 10/17  Plan: Continue Cefepime 2g IV q12h Watch renal function, cultures and clinical course.  Height: '5\' 6"'$  (167.6 cm) Weight: 107.3 kg (236 lb 8.9 oz) IBW/kg (Calculated) : 59.3  Temp (24hrs), Avg:97.9 F (36.6 C), Min:97 F (36.1 C), Max:98.4 F (36.9 C)  Recent Labs  Lab 09/25/21 0316 09/26/21 0244 09/27/21 0448 09/29/21 0158 09/30/21 0353  WBC 21.1* 22.7* 21.9* 20.4* 23.9*  CREATININE 2.19* 2.45* 1.99* 1.99* 2.01*     Estimated Creatinine Clearance: 39.7 mL/min (A) (by C-G formula based on SCr of 2.01 mg/dL (H)).    No Known Allergies  Antimicrobials this admission: Ceftriaxone 10/11 >> 10/15 Cefepime 10/17 >> Vancomycin 10/17 >> 10/17  Microbiology results: 10/10 BCx: negative 10/14 TA: negative  Thank you for allowing pharmacy to be a part of this patient's care.  [place] Crane, Roylene Reason, BCCP Clinical Pharmacist  09/30/2021 2:17 PM   [facility] pharmacy phone numbers are listed on [email or web address]

## 2021-10-01 DIAGNOSIS — G9340 Encephalopathy, unspecified: Secondary | ICD-10-CM | POA: Diagnosis not present

## 2021-10-01 LAB — BASIC METABOLIC PANEL
Anion gap: 10 (ref 5–15)
BUN: 68 mg/dL — ABNORMAL HIGH (ref 6–20)
CO2: 22 mmol/L (ref 22–32)
Calcium: 9.9 mg/dL (ref 8.9–10.3)
Chloride: 107 mmol/L (ref 98–111)
Creatinine, Ser: 2.39 mg/dL — ABNORMAL HIGH (ref 0.44–1.00)
GFR, Estimated: 24 mL/min — ABNORMAL LOW (ref >60)
Glucose, Bld: 198 mg/dL — ABNORMAL HIGH (ref 70–99)
Potassium: 5.1 mmol/L (ref 3.5–5.1)
Sodium: 139 mmol/L (ref 135–145)

## 2021-10-01 LAB — CBC WITH DIFFERENTIAL/PLATELET
Abs Immature Granulocytes: 0.43 10*3/uL — ABNORMAL HIGH (ref 0.00–0.07)
Basophils Absolute: 0 10*3/uL (ref 0.0–0.1)
Basophils Relative: 0 %
Eosinophils Absolute: 0 10*3/uL (ref 0.0–0.5)
Eosinophils Relative: 0 %
HCT: 32.9 % — ABNORMAL LOW (ref 36.0–46.0)
Hemoglobin: 10.5 g/dL — ABNORMAL LOW (ref 12.0–15.0)
Immature Granulocytes: 2 %
Lymphocytes Relative: 11 %
Lymphs Abs: 2.8 10*3/uL (ref 0.7–4.0)
MCH: 29 pg (ref 26.0–34.0)
MCHC: 31.9 g/dL (ref 30.0–36.0)
MCV: 90.9 fL (ref 80.0–100.0)
Monocytes Absolute: 1.5 10*3/uL — ABNORMAL HIGH (ref 0.1–1.0)
Monocytes Relative: 6 %
Neutro Abs: 20.7 10*3/uL — ABNORMAL HIGH (ref 1.7–7.7)
Neutrophils Relative %: 81 %
Platelets: 314 10*3/uL (ref 150–400)
RBC: 3.62 MIL/uL — ABNORMAL LOW (ref 3.87–5.11)
RDW: 13.3 % (ref 11.5–15.5)
WBC: 25.4 10*3/uL — ABNORMAL HIGH (ref 4.0–10.5)
nRBC: 0 % (ref 0.0–0.2)

## 2021-10-01 LAB — GLUCOSE, CAPILLARY
Glucose-Capillary: 135 mg/dL — ABNORMAL HIGH (ref 70–99)
Glucose-Capillary: 182 mg/dL — ABNORMAL HIGH (ref 70–99)
Glucose-Capillary: 189 mg/dL — ABNORMAL HIGH (ref 70–99)
Glucose-Capillary: 191 mg/dL — ABNORMAL HIGH (ref 70–99)
Glucose-Capillary: 222 mg/dL — ABNORMAL HIGH (ref 70–99)
Glucose-Capillary: 276 mg/dL — ABNORMAL HIGH (ref 70–99)

## 2021-10-01 LAB — SURGICAL PCR SCREEN
MRSA, PCR: NEGATIVE
Staphylococcus aureus: NEGATIVE

## 2021-10-01 LAB — BRAIN NATRIURETIC PEPTIDE: B Natriuretic Peptide: 48.7 pg/mL (ref 0.0–100.0)

## 2021-10-01 LAB — MAGNESIUM: Magnesium: 2.1 mg/dL (ref 1.7–2.4)

## 2021-10-01 MED ORDER — IPRATROPIUM-ALBUTEROL 0.5-2.5 (3) MG/3ML IN SOLN
3.0000 mL | RESPIRATORY_TRACT | Status: DC | PRN
  Administered 2021-10-01 – 2021-10-02 (×4): 3 mL via RESPIRATORY_TRACT
  Filled 2021-10-01 (×2): qty 3
  Filled 2021-10-01: qty 6
  Filled 2021-10-01: qty 3

## 2021-10-01 MED ORDER — FUROSEMIDE 10 MG/ML IJ SOLN
60.0000 mg | Freq: Once | INTRAMUSCULAR | Status: AC
  Administered 2021-10-01: 60 mg via INTRAVENOUS
  Filled 2021-10-01: qty 6

## 2021-10-01 MED ORDER — MUPIROCIN 2 % EX OINT: 1.0000 "application " | TOPICAL_OINTMENT | Freq: Two times a day (BID) | CUTANEOUS | Status: DC

## 2021-10-01 MED ORDER — INSULIN DETEMIR 100 UNIT/ML ~~LOC~~ SOLN
30.0000 [IU] | Freq: Two times a day (BID) | SUBCUTANEOUS | Status: DC
  Administered 2021-10-01 – 2021-10-04 (×8): 30 [IU] via SUBCUTANEOUS
  Filled 2021-10-01 (×11): qty 0.3

## 2021-10-01 MED ORDER — LACTATED RINGERS IV BOLUS: 500.0000 mL | Freq: Once | INTRAVENOUS | Status: DC

## 2021-10-01 MED ORDER — LIP MEDEX EX OINT
TOPICAL_OINTMENT | CUTANEOUS | Status: DC | PRN
  Administered 2021-10-01: 75 via TOPICAL
  Filled 2021-10-01: qty 7

## 2021-10-01 MED ORDER — HYDRALAZINE HCL 50 MG PO TABS
50.0000 mg | ORAL_TABLET | Freq: Three times a day (TID) | ORAL | Status: DC
  Administered 2021-10-01 – 2021-10-04 (×10): 50 mg via ORAL
  Filled 2021-10-01 (×10): qty 1

## 2021-10-01 MED ORDER — PANTOPRAZOLE SODIUM 40 MG PO TBEC
40.0000 mg | DELAYED_RELEASE_TABLET | Freq: Every day | ORAL | Status: DC
  Administered 2021-10-02 – 2021-10-04 (×3): 40 mg via ORAL
  Filled 2021-10-01 (×3): qty 1

## 2021-10-01 NOTE — Progress Notes (Signed)
SLP Cancellation Note  Patient Details Name: Maureen Jones MRN: [id number] DOB: 11/05/1966   Cancelled treatment:       Reason Eval/Treat Not Completed: Medical issues which prohibited therapy; pt may need to go back on BiPAP per RN - has been giving only limited POs this am.  SLP will follow - please page weekend SLP pager Sat/Sun if our services are needed: [phone].  Brack Shaddock L. Tivis Ringer, [place] CCC/SLP Acute Rehabilitation Services Office number [phone] Pager [phone]  Juan Quam Laurice 10/01/2021, 10:50 AM

## 2021-10-01 NOTE — Progress Notes (Signed)
Physical Therapy Treatment Patient Details Name: Maureen Jones MRN: [id number] DOB: 10/07/1966 Today's Date: 10/01/2021   History of Present Illness Pt is a 55 y.o. female admitted 09/20/21 with AMS. 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 CXR showed low lung volumes, L>R interstitial opacities. Workup for acute metabolic encephalopathy in setting of hypertensive urgency, possible PRES. ETT 10/10-10/12, reintubated 10/12-10/14. Course complicated by confusion/agitation overnight 10/17. PMH includes HTN, DM.   PT Comments    Pt progressing with mobility. Today's session focused on transfer and gait training with RW; pt with instability requiring intermittent minA to maintain balance; pt quick to fatigue with DOE 3/4 after short bouts of standing activity. Pt limited by generalized weakness, decreased activity tolerance, and impaired balance strategies/postural reactions. Will continue to follow acutely to address established goals.    Recommendations for follow up therapy are one component of a multi-disciplinary discharge planning process, led by the attending physician.  Recommendations may be updated based on patient status, additional functional criteria and insurance authorization.  Follow Up Recommendations  Home health PT;Supervision/Assistance - 24 hour     Equipment Recommendations  3in1 (PT) (TBD on RW)    Recommendations for Other Services       Precautions / Restrictions Precautions Precautions: Fall;Other (comment) Precaution Comments: Urinary urgency Restrictions Weight Bearing Restrictions: No     Mobility  Bed Mobility Overal bed mobility: Needs Assistance Bed Mobility: Supine to Sit     Supine to sit: Supervision     General bed mobility comments: supervision for safety and lines, HOB up    Transfers Overall transfer level: Needs assistance Equipment used: None Transfers: Sit to/from Stand Sit to Stand: Min guard          General transfer comment: Multiple sit<>stands from EOB, toilet and recliner, min guard for balance  Ambulation/Gait Ambulation/Gait assistance: Min guard;Min assist Gait Distance (Feet): 10 Feet (10') Assistive device: None;1 person hand held assist Gait Pattern/deviations: Step-through pattern;Decreased stride length Gait velocity: Decreased   General Gait Details: Slow, mildly unsteady gait with intermittent HHA for balance, min guard; pt with seated rest to void on toilet, noted DOE 3-4/4; additional 10' ambulation to recliner, pt very fatigued with DOE requiring seated rest   Stairs             Wheelchair Mobility    Modified Rankin (Stroke Patients Only)       Balance Overall balance assessment: Needs assistance   Sitting balance-[name]: Good       Standing balance-[name]: Poor Standing balance comment: fair static standing during functional activity, needs min assist for ambulation                            Cognition Arousal/Alertness: Awake/alert Behavior During Therapy: Impulsive Overall Cognitive Status: No family/caregiver present to determine baseline cognitive functioning Area of Impairment: Safety/judgement;Awareness;Problem solving                         Safety/Judgement: Decreased awareness of safety;Decreased awareness of deficits Awareness: Emergent Problem Solving: Requires verbal cues General Comments: pt with urinary urgency contributing to impulsivity and decreased awareness of lines      Exercises      General Comments        Pertinent Vitals/Pain Pain Assessment: Faces Faces Pain Scale: Hurts a little bit Pain Location: throat Pain Descriptors / Indicators: Sore Pain Intervention(s): Monitored during session  Home Living                      Prior Function            PT Goals (current goals can now be found in the care plan section) Acute Rehab PT Goals Patient Stated Goal:  to breathe better Progress towards PT goals: Progressing toward goals    Frequency    Min 3X/week      PT Plan Current plan remains appropriate    Co-evaluation       OT goals addressed during session: ADL's and self-care      AM-PAC PT "6 Clicks" Mobility   Outcome Measure  Help needed turning from your back to your side while in a flat bed without using bedrails?: None Help needed moving from lying on your back to sitting on the side of a flat bed without using bedrails?: A Little Help needed moving to and from a bed to a chair (including a wheelchair)?: A Little Help needed standing up from a chair using your arms (e.g., wheelchair or bedside chair)?: A Little Help needed to walk in hospital room?: A Little Help needed climbing 3-5 steps with a railing? : A Little 6 Click Score: 19    End of Session   Activity Tolerance: Patient tolerated treatment well;Patient limited by fatigue Patient left: in chair;with call bell/phone within reach;with chair alarm set;with nursing/sitter in room Nurse Communication: Mobility status PT Visit Diagnosis: Unsteadiness on feet (R26.81);Other abnormalities of gait and mobility (R26.89);Difficulty in walking, not elsewhere classified (R26.2)     Time: [id number] PT Time Calculation (min) (ACUTE ONLY): 32 min  Charges:  $Therapeutic Activity: 8-22 mins                    Mabeline Caras, PT, DPT Acute Rehabilitation Services  Pager [phone] Office [place] 10/01/2021, 3:29 PM

## 2021-10-01 NOTE — Progress Notes (Signed)
PROGRESS NOTE    Maureen Jones  [id number] DOB: 04/16/1966 DOA: 09/20/2021 PCP: Pcp, No   Brief Narrative: 55 year old with past medical history significant for hypertension and diabetes who presented to [facility] on 10/10 with acute altered mental status.  She was reportedly at home and went to smoke a cigarette at 2 AM with her nephew.  She was apparently seen by her family at that time.  After smoking, the family heard a loud noise and she was found altered and confused.  On arrival to the ER she was noted to be agitated, confused.  She was seen initially as a code stroke.  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  She was hypertensive with initial pressure of 240/ 112.  CBG 437, creatinine 2.1, ammonia 30, white blood cell 14, UDS was negative.  Chest x-ray showed low lung volume.  Patient was intubated on admission for acute encephalopathy.  On 10/12 patient was extubated, due to increased work of breathing she was reintubated.  On 10/14 she was extubated, she had some stridors, received racemic epi, she was transiently on Precedex for agitation.  She has remained stable and she was transitioned to [facility] on 10/19.  He was evaluated by ENT who recommended treatment for reflux, patient has some mild edema of the glottis and supraglottic larynx post intubation.   Assessment & Plan:   Active Problems:   Encephalopathy acute   Endotracheal tube present  1-Acute Hypoxic Respiratory Failure: -Initially admitted for altered mental status, airway protection. -Previously intubated 2 times during this admission, second time due to her stridor, but no significant laryngeal edema noted during reintubation.  Extubated on 10/14 with ongoing hoarseness.  ENT consulted.  Mild edema glottis and supraglottic larynx.  ENT recommended PPI and follow-up as an outpatient.  Patient is at risk for developing subglottic stenosis. -Chest x-ray  concerning for right middle lobe and lower lobe pneumonia.  Plan to continue with IV antibiotics.  Need at least 7 days. -On Daily Claritin and Singulair. -She has history of asthma, she would need at discharge Dulera, Spiriva, Worsening respiratory distress. Back on BIPAP, IV steroids. Appreciate CM eval.  -Chest x ray vascular congestion. She has received lasix doses.  -ECHO NL ef, Diastolic parameter indeterminate.  Discussed with CCM, resp failure now from asthma exacerbation.  BIPAP PRN today.   2-Hoarseness: Persistent postextubation due to subglottic edema. ENT consulted recommended aggressive care for GERD. Protonix twice daily Needs to  follow-up with ENT as an outpatient.  Acute hypertensive emergency: Initial blood pressure 240/112 ECHO; Moderate left ventricular hypertrophy Continue with amlodipine, Coreg,. Hold cozaar.  Start scheduled hydralazine  Acute metabolic encephalopathy due to hypertensive emergency: CT head negative MRI negative. sundown at night. Continue with Seroquel Ativan PRN>   Dysphagia: Evaluated by speech today who recommended regular solids and thin liquids diet. Leukocytosis : Suspect related to steroid.  On IV antibiotics as well.  AKI: Improved monitor. Cr: 1.9.  Peak to 2.4. Hold Cozaar.  Diabetes uncontrolled hyperglycemia: Continue with Levemir 25 units twice daily.   meal coverage.  Anemia of critical illness  Nutrition Problem: Inadequate oral intake Etiology: inability to eat    Signs/Symptoms: NPO status    Interventions: Ensure Enlive (each supplement provides 350kcal and 20 grams of protein), MVI  Estimated body mass index is 37.22 kg/m as calculated from the following:   Height as of this encounter: '5\' 6"'$  (1.676 m).  Weight as of this encounter: 104.6 kg.   DVT prophylaxis: Heparin Code Status: Full code Family Communication: Disposition Plan:  Status is: Inpatient  Remains inpatient appropriate because:  Uncontrolled hypertension, respiratory failure, hyperglycemia        Consultants:  CM admitted patient ENT  Procedures:    Antimicrobials:    Subjective: Off BIPAP, breathing better.   Objective: Vitals:   10/01/21 0800 10/01/21 0802 10/01/21 0808 10/01/21 0913  BP: (!) 148/86 (!) 148/86  (!) 147/87  Pulse: 96 95    Resp: 19     Temp:      TempSrc:      SpO2: 100%  99%   Weight:      Height:        Intake/Output Summary (Last 24 hours) at 10/01/2021 0954 Last data filed at 10/01/2021 0800 Gross per 24 hour  Intake 300.13 ml  Output 2800 ml  Net -2499.87 ml    Filed Weights   09/29/21 0500 09/30/21 0500 10/01/21 0500  Weight: 106.1 kg 107.3 kg 104.6 kg    Examination:  General exam: NAD Respiratory system: BL crackle, no wheezing Cardiovascular system: S 1, S 2 RRR Gastrointestinal system: BS present, soft, nt Central nervous system: alert Extremities: No edema  Data Reviewed: I have personally reviewed following labs and imaging studies  CBC: Recent Labs  Lab 09/26/21 0244 09/27/21 0448 09/27/21 0724 09/29/21 0158 09/30/21 0353 10/01/21 0438  WBC 22.7* 21.9*  --  20.4* 23.9* 25.4*  NEUTROABS  --   --   --   --   --  20.7*  HGB 10.7* 10.7* 10.5* 10.8* 10.5* 10.5*  HCT 35.5* 34.4* 31.0* 34.3* 33.1* 32.9*  MCV 94.7 91.7  --  90.7 92.2 90.9  PLT 266 303  --  308 297 [id number]    Basic Metabolic Panel: Recent Labs  Lab 09/25/21 0316 09/26/21 0244 09/27/21 0448 09/27/21 0724 09/29/21 0158 09/30/21 0353 10/01/21 0438  NA 147* 141 138 142 132* 137 139  K 4.1 4.4 4.1 4.1 4.6 4.7 5.1  CL 113* 111 109  --  105 109 107  CO2 26 21* 24  --  19* 20* 22  GLUCOSE 94 307* 138*  --  206* 217* 198*  BUN 42* 55* 55*  --  63* 62* 68*  CREATININE 2.19* 2.45* 1.99*  --  1.99* 2.01* 2.39*  CALCIUM 9.6 9.5 9.7  --  9.3 9.6 9.9  MG 2.4 2.3 2.2  --   --   --  2.1  PHOS 3.8 3.5 3.2  --   --   --   --     GFR: Estimated Creatinine Clearance: 32.9 mL/min  (A) (by C-G formula based on SCr of 2.39 mg/dL (H)). Liver Function Tests: No results for input(s): AST, ALT, ALKPHOS, BILITOT, PROT, ALBUMIN in the last 168 hours. No results for input(s): LIPASE, AMYLASE in the last 168 hours. No results for input(s): AMMONIA in the last 168 hours. Coagulation Profile: No results for input(s): INR, PROTIME in the last 168 hours. Cardiac Enzymes: No results for input(s): CKTOTAL, CKMB, CKMBINDEX, TROPONINI in the last 168 hours. BNP (last 3 results) No results for input(s): PROBNP in the last 8760 hours. HbA1C: No results for input(s): HGBA1C in the last 72 hours. CBG: Recent Labs  Lab 09/30/21 1558 09/30/21 2226 10/01/21 0033 10/01/21 0319 10/01/21 0640  GLUCAP 237* 220* 191* 189* 182*    Lipid Profile: No results for input(s): CHOL, HDL, LDLCALC, TRIG, CHOLHDL, LDLDIRECT in  the last 72 hours. Thyroid Function Tests: No results for input(s): TSH, T4TOTAL, FREET4, T3FREE, THYROIDAB in the last 72 hours. Anemia Panel: No results for input(s): VITAMINB12, FOLATE, FERRITIN, TIBC, IRON, RETICCTPCT in the last 72 hours. Sepsis Labs: No results for input(s): PROCALCITON, LATICACIDVEN in the last 168 hours.  Recent Results (from the past 240 hour(s))  Culture, Respiratory w Gram Stain     Status: None   Collection Time: 09/24/21  9:48 AM   Specimen: Tracheal Aspirate; Respiratory  Result Value Ref Range Status   Specimen Description TRACHEAL ASPIRATE  Final   Special Requests NONE  Final   Gram Stain   Final    FEW WBC PRESENT, PREDOMINANTLY PMN NO ORGANISMS SEEN    Culture   Final    RARE Normal respiratory flora-no Staph aureus or Pseudomonas seen Performed at [facility], [address], [place], [place] [address]    Report Status 09/26/2021 FINAL  Final  MRSA Next Gen by PCR, Nasal     Status: None   Collection Time: 09/27/21  7:50 AM   Specimen: Nasal Mucosa; Nasal Swab  Result Value Ref Range Status   MRSA by PCR Next Gen  NOT DETECTED NOT DETECTED Final    Comment: (NOTE) The GeneXpert MRSA Assay (FDA approved for NASAL specimens only), is one component of a comprehensive MRSA colonization surveillance program. It is not intended to diagnose MRSA infection nor to guide or monitor treatment for MRSA infections. Test performance is not FDA approved in patients less than 89 years old. Performed at [facility], [place] [address] [place], [place] [address]   Culture, Respiratory w Gram Stain     Status: None   Collection Time: 09/27/21  2:22 PM   Specimen: Tracheal Aspirate; Respiratory  Result Value Ref Range Status   Specimen Description TRACHEAL ASPIRATE  Final   Special Requests NONE  Final   Gram Stain   Final    ABUNDANT WBC PRESENT,BOTH PMN AND MONONUCLEAR ABUNDANT GRAM NEGATIVE RODS MODERATE GRAM POSITIVE COCCI    Culture   Final    ABUNDANT Normal respiratory flora-no Staph aureus or Pseudomonas seen Performed at [facility], [address], [place], [place] [address]    Report Status 09/29/2021 FINAL  Final  Surgical PCR screen     Status: None   Collection Time: 10/01/21  8:26 AM   Specimen: Nasal Mucosa; Nasal Swab  Result Value Ref Range Status   MRSA, PCR NEGATIVE NEGATIVE Final   Staphylococcus aureus NEGATIVE NEGATIVE Final    Comment: (NOTE) The Xpert SA Assay (FDA approved for NASAL specimens in patients 54 years of age and older), is one component of a comprehensive surveillance program. It is not intended to diagnose infection nor to guide or monitor treatment. Performed at [facility], [place] [address], [place],  [address]           Radiology Studies: DG CHEST PORT 1 VIEW  Result Date: 09/30/2021 CLINICAL DATA:  Shortness of breath EXAM: PORTABLE CHEST 1 VIEW COMPARISON:  Radiograph 09/27/2021 FINDINGS: Unchanged, enlarged cardiomediastinal silhouette. There is central pulmonary vascular congestion. There is no large pleural effusion or visible  pneumothorax. Low lung volumes. No acute osseous abnormality. IMPRESSION: Unchanged cardiomegaly with central pulmonary vascular congestion. Unchanged low lung volumes. Electronically Signed   By: Maurine Simmering M.D.   On: 09/30/2021 10:29   DG Swallowing Func-Speech Pathology  Result Date: 09/29/2021 Table formatting from the original result  was not included. Objective Swallowing Evaluation: Type of Study: MBS-Modified Barium Swallow Study  Patient Details Name: Leonah Stobbs MRN: [id number] Date of Birth: [birth year] Today's Date: 09/29/2021 Time: SLP Start Time (ACUTE ONLY): 1300 -SLP Stop Time (ACUTE ONLY): 1315 SLP Time Calculation (min) (ACUTE ONLY): 15 min Past Medical History: Past Medical History: Diagnosis Date  Asthma   Diabetes ([place])   Glottic and subglottic edema 2022  Hypertension  Past Surgical History: No past surgical history on file. HPI: 55 year old female admitted with AMS 10/10, intubated, stroke w/u negative. Diagnosed with acute metabolic encephalopathy in the setting of hypertensive emergency, possible PRES. Extubated but reintubated 10/12 with increased WOB, not mobilizing secretions, stridor, received steroids, NO laryngeal edema noted. Extubated 10/14 with lingering stridor, improving 10/15 am. ENT scoped pt 10/18: "Edema of the glottis and subglottic larynx following recent intubation and then repeat intubation.  There is no evidence of cord paralysis or any tumor present.  There is currently no stridor.  Allow more time for resolution of the swelling.  Unfortunately she is at risk for developing subglottic stenosis."  Subjective: Pt seen in radiology. No family present Assessment / Plan / Recommendation CHL IP CLINICAL IMPRESSIONS 09/29/2021 Clinical Impression Pt presents with mild oropharyngeal dysphagia, characterized as follows: Orally, pt manages all textures well. There was a trace amount of posterior spillage of oral residue after the initial bolus of thin liquid, which spilled  to the vallecular sinus. This was not replicated on other consistencies. Pharyngeal swallow is characterized by trigger of the swallow reflex at the vallecular sinus across consistencies. There was no post-swallow pharyngeal residue after any presentation. Very trace penetration was seen intermittently during the swallow of thin liquids. Penetrate cleared spontaneously and was not aspirated. This occurred more on when pt was drinking from a straw. No aspiration of any consistency was seen. Recommend continuing regular diet with thin liquids, but avoid straw use. Meds whole with liquid. Safe swallow precautions were updated and sent with transport [facility]. SLP will continue to follow to assess diet tolerance and provide education. RN and MD informed of results and recommendations.  SLP Visit Diagnosis Dysphagia, oropharyngeal phase (R13.12)     Impact on safety and function Mild aspiration risk   CHL IP TREATMENT RECOMMENDATION 09/29/2021 Treatment Recommendations Therapy as outlined in treatment plan below   Prognosis 09/29/2021 Prognosis for Safe Diet Advancement Good     CHL IP DIET RECOMMENDATION 09/29/2021 SLP Diet Recommendations Regular solids;Thin liquid Liquid Administration via No straw;Cup Medication Administration Whole meds with liquid Compensations Slow rate;Small sips/bites Postural Changes Remain semi-upright after after feeds/meals (Comment);Seated upright at 90 degrees   CHL IP OTHER RECOMMENDATIONS 09/29/2021   Oral Care Recommendations Oral care BID     CHL IP FOLLOW UP RECOMMENDATIONS 09/29/2021 Follow up Recommendations TBD   CHL IP FREQUENCY AND DURATION 09/29/2021 Speech Therapy Frequency (ACUTE ONLY) min 2x/week Treatment Duration 1 week      CHL IP ORAL PHASE 09/29/2021 Oral Phase Impaired Posterior spillage of thin liquids after the initial swallow     CHL IP PHARYNGEAL PHASE 09/29/2021 Pharyngeal Phase Impaired Trigger at Vallecular sinus Trace penetration seen intermittently during  straw drinking thin liquids  CHL IP CERVICAL ESOPHAGEAL PHASE 09/29/2021 Cervical Esophageal Phase [facility]  Celia B. Quentin Ore, [facility], [place] Speech Language Pathologist Office: [phone] Shonna Chock 09/29/2021, 1:41 PM                   Scheduled Meds:  amLODipine  10 mg Oral Daily   carvedilol  25 mg Oral BID WC   chlorhexidine  15 mL Mouth Rinse BID   Chlorhexidine Gluconate Cloth  6 each Topical Daily   feeding supplement  237 mL Oral BID BM   guaiFENesin  1,200 mg Oral BID   heparin  5,000 Units Subcutaneous Q8H   hydrALAZINE  50 mg Oral Q8H   insulin aspart  0-15 Units Subcutaneous TID WC   insulin aspart  3 Units Subcutaneous TID WC   insulin detemir  30 Units Subcutaneous BID   loratadine  10 mg Oral Daily   mouth rinse  15 mL Mouth Rinse q12n4p   methylPREDNISolone (SOLU-MEDROL) injection  125 mg Intravenous Daily   mometasone-formoterol  2 puff Inhalation BID   montelukast  10 mg Oral QHS   pantoprazole (PROTONIX) IV  40 mg Intravenous Q12H   QUEtiapine  50 mg Oral QHS   sodium chloride  2 spray Each Nare Q2000   Continuous Infusions:  ceFEPime (MAXIPIME) IV 2 g (10/01/21 0907)     LOS: 11 days    Time spent: 35 minutes   Jarrid Lienhard A Haeli Gerlich, MD [facility]   If 7PM-7AM, please contact night-coverage [email or web address]  10/01/2021, 9:54 AM

## 2021-10-01 NOTE — Progress Notes (Signed)
[place] Progress Note Patient Name: Maureen Jones DOB: 1966/03/18 MRN: [id number]   Date of Service  10/01/2021  HPI/Events of Note  Bedside RN reporting coarse crackles. Most recent CXR showed vascular congestion.  eICU Interventions  Lasix 60 mg iv x 1 ordered, BNP ordered.        [name] [name] 10/01/2021, 4:46 AM

## 2021-10-01 NOTE — Progress Notes (Signed)
NAMEKaida Jones, MRN:  [id number], DOB:  1966/11/29, LOS: 74 ADMISSION DATE:  09/20/2021, CONSULTATION DATE: 10/10 REFERRING MD:  Dr. Laverta Baltimore, CHIEF COMPLAINT:  AMS   History of Present Illness:  55 y/o F who presented to [facility] on 10/10 with reports of acute altered mental status.   She was reportedly at home and went to smoke a cigarette 0200 with her nephew. She was apparently seen by her family at that time.  After smoking, the family heard a loud thud and she was found altered and confused.  On arrival to the ER she was noted to be agitated, confused, localized to pain and stated "that hurts" with stimulation.  She was seen initially as a CODE STROKE.  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 She was hypertensive with initial pressure of 240/112 with MAP of 136.  Initial labs showed glucose of 437, BUN 36, Sr CR 2.17, CK 316, ammonia 30, Mg 1.9, WBC 14.4. UDS was negative.  COVID & flu screening were negative.  CXR showed low lung volumes, ETT in good position, L>R interstitial opacities.    PCCM called for ICU admission.   Pertinent  Medical History  HTN DM   Significant Hospital Events: Including procedures, antibiotic start and stop dates in addition to other pertinent events   10/10 Admit with acute encephalopathy, hypertensive 10/12 extubated, increased WOB, re-intubated 10/14 extubated , lingering stridor, Received racemic epi x2 over the last 24 hours + another dose of Decadron, Mild agitation requiring restart of Precedex 10/15 swallow evaluation >> dysphagia 3 diet  Interim History / Subjective:  Breathing significantly better, able to come off bipap this morning.  Objective   Blood pressure (!) 148/86, pulse 95, temperature 98.3 F (36.8 C), temperature source Axillary, resp. rate 18, height 5\' 6"  (1.676 m), weight 104.6 kg, SpO2 100 %.    Vent Mode: PSV FiO2 (%):  [30 %-50 %] 30 % Set Rate:  [15 bmp] 15  bmp PEEP:  [5 cmH20] 5 cmH20 Pressure Support:  [10 cmH20] 10 cmH20   Intake/Output Summary (Last 24 hours) at 10/01/2021 0803 Last data filed at 10/01/2021 0630 Gross per 24 hour  Intake 300.13 ml  Output 2150 ml  Net -1849.87 ml    Filed Weights   09/29/21 0500 09/30/21 0500 10/01/21 0500  Weight: 106.1 kg 107.3 kg 104.6 kg    Examination: General: middle aged woman sitting up in bed in NAD HENT: [place]/AT, eyes anicteric, mild throat erythema but no plaques Neck: no stridor, hoarseness improving Lungs: no wheezing, no accessory muscles today. Still coughing up thick sputum-tan colored. Cardiovascular: S1S2, RRR Abdomen: obese, soft, NT, ND Extremities: no c/c/e Neuro: awake and alert,   Resp culture > normal flora WBC 25.4 BUN 62 Cr 2.39 Bicarb 20 BNP 48.7 CXR 10/20 personally reviewed>  low lung volumes, RML opacity improving   Resolved Hospital Problem list     Assessment & Plan:   Acute respiratory failure with hypoxia RML and LLL pneumonia.  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  Extubated on 10/14 with ongoing hoarseness. Now confirmed glottic and subglottic edema when ENT scoped her. -wean supllemental O2 as able -bipap PRN -change duonebs to PRN -repeat sputum culture, reswab nares for MRSA. May need to change  antibiotics, but today very stable so will wait on culture data. -Con't LABA/ICS--Dulera here, increased to high dose with flare on low dose. At discharge can do [place], Spiriva, Advair, [place]-- whichever is least expensive. I reinforced today that she will need an outpatient inhaler regimen and cannot use albuterol PRN. -Keep high dose steroids today. She has proven she needs a very slow taper. -Con't claritin and singulair -no  more lasix todya -I worry that she has poor insight into her symptom burden, which is dangerous as an asthmatic, especially with high risk of developing subglottic stenosis. These symptoms could be very similar and hard to differentiate which disease process is worsening.  Hoarseness persistent post-extubation due to subglottic edema -Appreciate ENT's management> con't aggressive GERD treatment. Dr. Constance Holster will follow her as an outpatient as well.  -Con't to monitor. So far no stridor this week.  Acute hypertensive emergency > now just hypertensive Initial BP 240/112, MAP 136  Echo moderate LVH -On amlodipine 10 mg daily, coreg 25 mg BID, hydralazine 50 mg TID -management per primary  Acute Metabolic Encephalopathy due to hypertensive emergency. Imaging negative for PRES. CT head negative. MRI negative. Has had sundowning at night this admission, even nights without respiratory distress -seroquel 50mg  QHS; haldol PRN.   AKI, improving 10/2020 cr 1.7. Cr improved after gentle hydration 10/11. -per primary  DM with uncontrolled hyperglycemia -per primary; increased lantus this morning given high numbers with steroids  Acute anemia due to critical illness -per primary  PCCM will continue to follow.  Best Practice (right click and "Reselect all SmartList Selections" daily)  Per primary  Labs   CBC: Recent Labs  Lab 09/26/21 0244 09/27/21 0448 09/27/21 0724 09/29/21 0158 09/30/21 0353 10/01/21 0438  WBC 22.7* 21.9*  --  20.4* 23.9* 25.4*  NEUTROABS  --   --   --   --   --  20.7*  HGB 10.7* 10.7* 10.5* 10.8* 10.5* 10.5*  HCT 35.5* 34.4* 31.0* 34.3* 33.1* 32.9*  MCV 94.7 91.7  --  90.7 92.2 90.9  PLT 266 303  --  308 297 314     Basic Metabolic Panel: Recent Labs  Lab 09/25/21 0316 09/26/21 0244 09/27/21 0448 09/27/21 0724 09/29/21 0158 09/30/21 0353 10/01/21 0438  NA 147* 141 138 142 132* 137 139  K 4.1 4.4 4.1 4.1 4.6 4.7 5.1  CL 113* 111 109  --  105 109 107   CO2 26 21* 24  --  19* 20* 22  GLUCOSE 94 307* 138*  --  206* 217* 198*  BUN 42* 55* 55*  --  63* 62* 68*  CREATININE 2.19* 2.45* 1.99*  --  1.99* 2.01* 2.39*  CALCIUM 9.6 9.5 9.7  --  9.3 9.6 9.9  MG 2.4 2.3 2.2  --   --   --  2.1  PHOS 3.8 3.5 3.2  --   --   --   --     GFR: Estimated Creatinine Clearance: 32.9 mL/min (A) (by C-G formula based on SCr of 2.39 mg/dL (H)). Recent Labs  Lab 09/27/21 0448 09/29/21 0158 09/30/21 0353 10/01/21 0438  WBC 21.9* 20.4* 23.9* 25.4*     Liver Function Tests: No results for input(s): AST, ALT, ALKPHOS, BILITOT, PROT, ALBUMIN in the last 168 hours.  No results for input(s): LIPASE, AMYLASE in the last 168 hours. No results for input(s): AMMONIA in the last 168 hours.   ABG    Component Value Date/Time   PHART 7.345 (L) 09/27/2021  0724   PCO2ART 47.0 09/27/2021 0724   PO2ART 73 (L) 09/27/2021 0724   HCO3 25.7 09/27/2021 0724   TCO2 27 09/27/2021 0724   O2SAT 94.0 09/27/2021 [place] Raychel Dowler, DO 10/01/21 8:35 AM [facility]

## 2021-10-01 NOTE — Progress Notes (Signed)
Occupational Therapy Treatment Patient Details Name: Maureen Jones MRN: [id number] DOB: 14-Nov-1966 Today's Date: 10/01/2021   History of present illness Pt is a 55 y.o. female admitted 09/20/21 with AMS. 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 CXR showed low lung volumes, L>R interstitial opacities. Workup for acute metabolic encephalopathy in setting of hypertensive urgency, possible PRES. ETT 10/10-10/12, reintubated 10/12-10/14. Course complicated by confusion/agitation overnight 10/17. PMH includes HTN, DM.   OT comments  Pt continues to be unsteady with ambulation, requiring min (hand held assist). Ambulated to bathroom for toileting and back to chair to be pushed in chair to outside door, pt requesting fresh air. Pt needing min assist for UB and LB dressing and during pericare to manage clothing. Pt continues to be impulsive with poor regard for safety and lines.    Recommendations for follow up therapy are one component of a multi-disciplinary discharge planning process, led by the attending physician.  Recommendations may be updated based on patient status, additional functional criteria and insurance authorization.    Follow Up Recommendations  Home health OT;Supervision/Assistance - 24 hour    Equipment Recommendations  3 in 1 bedside commode    Recommendations for Other Services      Precautions / Restrictions Precautions Precautions: Fall       Mobility Bed Mobility Overal bed mobility: Needs Assistance Bed Mobility: Supine to Sit     Supine to sit: Supervision     General bed mobility comments: supervision for safety and lines, HOB up    Transfers Overall transfer level: Needs assistance Equipment used: None Transfers: Sit to/from Stand Sit to Stand: Min guard         General transfer comment: min guard for safety and lines    Balance Overall balance assessment: Needs assistance   Sitting balance-[name]: Good        Standing balance-[name]: Poor Standing balance comment: fair static standing during functional activity, needs min assist for ambulation                           ADL either performed or assessed with clinical judgement   ADL Overall ADL's : Needs assistance/impaired     Grooming: Wash/dry face;Wash/dry hands;Sitting;Set up           Upper Body Dressing : Set up;Standing Upper Body Dressing Details (indicate cue type and reason): front opening gown Lower Body Dressing: Minimal assistance;Sit to/from stand;+2 for safety/equipment Lower Body Dressing Details (indicate cue type and reason): mesh underwear Toilet Transfer: Minimal assistance;+2 for safety/equipment;Ambulation;Regular Toilet   Toileting- Clothing Manipulation and Hygiene: Minimal assistance;Sit to/from stand Toileting - Clothing Manipulation Details (indicate cue type and reason): min assist to manage clothing, pt able to complete pericare     Functional mobility during ADLs: Minimal assistance;+2 for safety/equipment       Vision       Perception     Praxis      Cognition Arousal/Alertness: Awake/alert Behavior During Therapy: Impulsive Overall Cognitive Status: No family/caregiver present to determine baseline cognitive functioning Area of Impairment: Safety/judgement                         Safety/Judgement: Decreased awareness of safety     General Comments: pt with urinary urgency contributing to impulsivity and decreased awareness of lines        Exercises     Shoulder Instructions  General Comments      Pertinent Vitals/ Pain       Pain Assessment: Faces Faces Pain Scale: Hurts a little bit Pain Location: throat Pain Descriptors / Indicators: Sore Pain Intervention(s): Monitored during session  Home Living                                          Prior Functioning/Environment              Frequency  Min 2X/week         Progress Toward Goals  OT Goals(current goals can now be found in the care plan section)  Progress towards OT goals: Progressing toward goals  Acute Rehab OT Goals Patient Stated Goal: to breathe better OT Goal Formulation: With patient Time For Goal Achievement: 10/09/21 Potential to Achieve Goals: Good  Plan Discharge plan remains appropriate    Co-evaluation          OT goals addressed during session: ADL's and self-care      AM-PAC OT "6 Clicks" Daily Activity     Outcome Measure   Help from another person eating meals?: None Help from another person taking care of personal grooming?: A Little Help from another person toileting, which includes using toliet, bedpan, or urinal?: A Little Help from another person bathing (including washing, rinsing, drying)?: A Little Help from another person to put on and taking off regular upper body clothing?: A Little Help from another person to put on and taking off regular lower body clothing?: A Little 6 Click Score: 19    End of Session    OT Visit Diagnosis: Other abnormalities of gait and mobility (R26.89);Other symptoms and signs involving cognitive function   Activity Tolerance Patient tolerated treatment well   Patient Left in chair;with call bell/phone within reach;with chair alarm set   Nurse Communication Mobility status (needs carmex, ok to go to outside door)        Time: [id number] OT Time Calculation (min): 37 min  Charges: OT General Charges $OT Visit: 1 Visit OT Treatments $Self Care/Home Management : 8-22 mins  Nestor Lewandowsky, OTR/L Acute Rehabilitation Services Pager: [phone] Office: [phone]   Malka So 10/01/2021, 1:51 PM

## 2021-10-02 ENCOUNTER — Inpatient Hospital Stay (HOSPITAL_COMMUNITY): Payer: Medicare Other

## 2021-10-02 DIAGNOSIS — R061 Stridor: Secondary | ICD-10-CM

## 2021-10-02 LAB — CBC
HCT: 32 % — ABNORMAL LOW (ref 36.0–46.0)
Hemoglobin: 9.9 g/dL — ABNORMAL LOW (ref 12.0–15.0)
MCH: 28.3 pg (ref 26.0–34.0)
MCHC: 30.9 g/dL (ref 30.0–36.0)
MCV: 91.4 fL (ref 80.0–100.0)
Platelets: 282 10*3/uL (ref 150–400)
RBC: 3.5 MIL/uL — ABNORMAL LOW (ref 3.87–5.11)
RDW: 13.4 % (ref 11.5–15.5)
WBC: 22.5 10*3/uL — ABNORMAL HIGH (ref 4.0–10.5)
nRBC: 0 % (ref 0.0–0.2)

## 2021-10-02 LAB — BASIC METABOLIC PANEL
Anion gap: 6 (ref 5–15)
BUN: 62 mg/dL — ABNORMAL HIGH (ref 6–20)
CO2: 24 mmol/L (ref 22–32)
Calcium: 9.6 mg/dL (ref 8.9–10.3)
Chloride: 106 mmol/L (ref 98–111)
Creatinine, Ser: 2.36 mg/dL — ABNORMAL HIGH (ref 0.44–1.00)
GFR, Estimated: 24 mL/min — ABNORMAL LOW (ref >60)
Glucose, Bld: 187 mg/dL — ABNORMAL HIGH (ref 70–99)
Potassium: 4.6 mmol/L (ref 3.5–5.1)
Sodium: 136 mmol/L (ref 135–145)

## 2021-10-02 LAB — GLUCOSE, CAPILLARY: Glucose-Capillary: 265 mg/dL — ABNORMAL HIGH (ref 70–99)

## 2021-10-02 MED ORDER — LORAZEPAM 2 MG/ML IJ SOLN
0.5000 mg | Freq: Every day | INTRAMUSCULAR | Status: DC
  Administered 2021-10-02 – 2021-10-03 (×2): 0.5 mg via INTRAVENOUS
  Filled 2021-10-02 (×2): qty 1

## 2021-10-02 MED ORDER — IPRATROPIUM-ALBUTEROL 0.5-2.5 (3) MG/3ML IN SOLN
3.0000 mL | RESPIRATORY_TRACT | Status: DC
  Administered 2021-10-02 – 2021-10-08 (×35): 3 mL via RESPIRATORY_TRACT
  Filled 2021-10-02 (×35): qty 3

## 2021-10-02 MED ORDER — VANCOMYCIN HCL 2000 MG/400ML IV SOLN
2000.0000 mg | Freq: Once | INTRAVENOUS | Status: AC
  Administered 2021-10-02: 2000 mg via INTRAVENOUS
  Filled 2021-10-02: qty 400

## 2021-10-02 MED ORDER — GUAIFENESIN ER 600 MG PO TB12
1200.0000 mg | ORAL_TABLET | Freq: Two times a day (BID) | ORAL | Status: AC
  Administered 2021-10-02 – 2021-10-03 (×4): 1200 mg via ORAL
  Filled 2021-10-02 (×4): qty 2

## 2021-10-02 MED ORDER — CLONAZEPAM 1 MG PO TABS
1.0000 mg | ORAL_TABLET | Freq: Two times a day (BID) | ORAL | Status: DC
  Administered 2021-10-02 – 2021-10-04 (×6): 1 mg via ORAL
  Filled 2021-10-02 (×6): qty 1

## 2021-10-02 MED ORDER — VANCOMYCIN HCL 1250 MG/250ML IV SOLN: 1250.0000 mg | INTRAVENOUS | Status: DC

## 2021-10-02 MED ORDER — RACEPINEPHRINE HCL 2.25 % IN NEBU
0.5000 mL | INHALATION_SOLUTION | Freq: Once | RESPIRATORY_TRACT | Status: AC
  Administered 2021-10-02: 0.5 mL via RESPIRATORY_TRACT
  Filled 2021-10-02: qty 0.5

## 2021-10-02 MED ORDER — GUAIFENESIN ER 600 MG PO TB12
600.0000 mg | ORAL_TABLET | Freq: Two times a day (BID) | ORAL | Status: DC | PRN
  Administered 2021-10-03: 600 mg via ORAL
  Filled 2021-10-02 (×2): qty 1

## 2021-10-02 MED ORDER — FLUOXETINE HCL 10 MG PO CAPS
10.0000 mg | ORAL_CAPSULE | Freq: Every day | ORAL | Status: DC
  Administered 2021-10-02 – 2021-10-04 (×3): 10 mg via ORAL
  Filled 2021-10-02 (×3): qty 1

## 2021-10-02 MED ORDER — SODIUM CHLORIDE 0.9 % IV SOLN
2.0000 g | Freq: Two times a day (BID) | INTRAVENOUS | Status: DC
  Administered 2021-10-02 – 2021-10-03 (×3): 2 g via INTRAVENOUS
  Filled 2021-10-02 (×3): qty 2

## 2021-10-02 NOTE — Progress Notes (Signed)
Pt unable to tolerate CPAP, and is refusing BIPAP at this time. Pt placed on 6L [place] for extra humidification. RT will continue to monitor.

## 2021-10-02 NOTE — Plan of Care (Signed)
  Problem: Health Behavior/Discharge Planning: Goal: Ability to manage health-related needs will improve Outcome: Progressing   Problem: Clinical Measurements: Goal: Ability to maintain clinical measurements within normal limits will improve Outcome: Progressing Goal: Will remain free from infection Outcome: Progressing Goal: Diagnostic test results will improve Outcome: Progressing Goal: Respiratory complications will improve Outcome: Progressing Goal: Cardiovascular complication will be avoided Outcome: Progressing   Problem: Activity: Goal: Risk for activity intolerance will decrease Outcome: Progressing   Problem: Nutrition: Goal: Adequate nutrition will be maintained Outcome: Progressing   Problem: Elimination: Goal: Will not experience complications related to bowel motility Outcome: Progressing Goal: Will not experience complications related to urinary retention Outcome: Progressing   Problem: Pain Managment: Goal: General experience of comfort will improve Outcome: Progressing   Problem: Activity: Goal: Ability to tolerate increased activity will improve Outcome: Progressing   Problem: Coping: Goal: Will verbalize positive feelings about self Outcome: Progressing Goal: Will identify appropriate support needs Outcome: Progressing   Problem: Health Behavior/Discharge Planning: Goal: Ability to manage health-related needs will improve Outcome: Progressing   Problem: Self-Care: Goal: Ability to participate in self-care as condition permits will improve Outcome: Progressing Goal: Verbalization of feelings and concerns over difficulty with self-care will improve Outcome: Progressing   Problem: Nutrition: Goal: Risk of aspiration will decrease Outcome: Progressing Goal: Dietary intake will improve Outcome: Progressing   Problem: Intracerebral Hemorrhage Tissue Perfusion: Goal: Complications of Intracerebral Hemorrhage will be minimized Outcome:  Progressing

## 2021-10-02 NOTE — Progress Notes (Signed)
[place] Progress Note Patient Name: Maureen Jones DOB: 09-13-1966 MRN: [id number]   Date of Service  10/02/2021  HPI/Events of Note  Increased WOB - has been refusing BiPAP. Now Back on BiPAP, however, he looks rough! Hx of asthma and subglottic stenosis. ABG pending.   eICU Interventions  Plan: Portable CXR STAT. Will request PCCM ground team evaluate the patient at bedside.      Intervention Category Major Interventions: Other:  Mili Piltz Cornelia Copa 10/02/2021, 6:05 AM

## 2021-10-02 NOTE — Progress Notes (Signed)
PROGRESS NOTE    Maureen Jones  OZD:[id number] DOB: 22-Apr-1966 DOA: 09/20/2021 PCP: Pcp, No   Brief Narrative: 55 year old with past medical history significant for hypertension and diabetes who presented to [facility] on 10/10 with acute altered mental status.  She was reportedly at home and went to smoke a cigarette at 2 AM with her nephew.  She was apparently seen by her family at that time.  After smoking, the family heard a loud noise and she was found altered and confused.  On arrival to the ER she was noted to be agitated, confused.  She was seen initially as a code stroke.  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  She was hypertensive with initial pressure of 240/ 112.  CBG 437, creatinine 2.1, ammonia 30, white blood cell 14, UDS was negative.  Chest x-ray showed low lung volume.  Patient was intubated on admission for acute encephalopathy.  On 10/12 patient was extubated, due to increased work of breathing she was reintubated.  On 10/14 she was extubated, she had some stridors, received racemic epi, she was transiently on Precedex for agitation.  She has remained stable and she was transitioned to [facility] on 10/19.  He was evaluated by ENT who recommended treatment for reflux, patient has some mild edema of the glottis and supraglottic larynx post intubation.   Assessment & Plan:   Active Problems:   Encephalopathy acute   Endotracheal tube present  1-Acute Hypoxic Respiratory Failure: related to asthma, PNA.  -Initially admitted for altered mental status, airway protection. -Previously intubated 2 times during this admission, second time due to her stridor, but no significant laryngeal edema noted during reintubation.  Extubated on 10/14 with ongoing hoarseness.  ENT consulted.  Mild edema glottis and supraglottic larynx.  ENT recommended PPI and follow-up as an outpatient.  Patient is at risk for developing subglottic  stenosis. -Chest x-ray concerning for right middle lobe and lower lobe pneumonia.   -On Cefepime, vancomycin started 10/22. Gram positive cocci in sputum.  -On Daily Claritin and Singulair. -She has history of asthma, she would need at discharge Dulera, Spiriva, -Chest x ray vascular congestion. She has received lasix doses.  -ECHO NL ef, Diastolic parameter indeterminate.  -BIPAP PRN, worsening work of breathing overnight. Received dose racemic epinephrine and inhale which help per nurse.  Appreciate Dr Carlis Abbott assistance with respiratory failure management.  She just ate breakfast, might need BIPAP   2-Hoarseness: Persistent postextubation due to subglottic edema. ENT consulted recommended aggressive care for GERD. Protonix twice daily Needs to  follow-up with ENT as an outpatient.  Acute hypertensive emergency: Initial blood pressure 240/112 ECHO; Moderate left ventricular hypertrophy Continue with amlodipine, Coreg,hydralazine Hold cozaar due to AKI.   Acute metabolic encephalopathy due to hypertensive emergency: CT head negative MRI negative. sundown at night. Continue with Seroquel Ativan PRN>   Dysphagia: Evaluated by speech today who recommended regular solids and thin liquids diet. Leukocytosis : Suspect related to steroid.  On IV antibiotics as well. Vancomycin added. 10/22  AKI: Improved monitor. Cr: 1.9.  Peak to 2.4. Hold Cozaar.  Diabetes uncontrolled hyperglycemia: Continue with Levemir 25 units twice daily.   meal coverage.  Anemia of critical illness; hb stable.   Nutrition Problem: Inadequate oral intake Etiology: inability to eat    Signs/Symptoms: NPO status    Interventions: Ensure Enlive (each supplement provides 350kcal and 20 grams of protein), MVI  Estimated body mass index is  37.22 kg/m as calculated from the following:   Height as of this encounter: 5\' 6"  (1.676 m).   Weight as of this encounter: 104.6 kg.   DVT prophylaxis:  Heparin Code Status: Full code Family Communication: Disposition Plan:  Status is: Inpatient  Remains inpatient appropriate because: Uncontrolled hypertension, respiratory failure, hyperglycemia        Consultants:  CM admitted patient ENT  Procedures:    Antimicrobials:    Subjective: She develops worsening respiratory distress overnight again,.  She report SOB, laborer breathing   Objective: Vitals:   10/02/21 0500 10/02/21 0600 10/02/21 0630 10/02/21 0635  BP: (!) 164/104 (!) 183/97 (!) 161/98   Pulse: 89 89 94   Resp: (!) 22 16 (!) 22   Temp:    97.9 F (36.6 C)  TempSrc:    Oral  SpO2: 99% 99% 98%   Weight:      Height:        Intake/Output Summary (Last 24 hours) at 10/02/2021 1116 Last data filed at 10/02/2021 0400 Gross per 24 hour  Intake 820 ml  Output 2350 ml  Net -1530 ml    Filed Weights   09/29/21 0500 09/30/21 0500 10/01/21 0500  Weight: 106.1 kg 107.3 kg 104.6 kg    Examination:  Jones exam: NAD Respiratory system: BL crackles, wheezing, respiratory distress.  Cardiovascular system: S 1, S 2 RRR Gastrointestinal system: BS present, soft, nt Central nervous system: Alert Extremities; no edema  Data Reviewed: I have personally reviewed following labs and imaging studies  CBC: Recent Labs  Lab 09/27/21 0448 09/27/21 0724 09/29/21 0158 09/30/21 0353 10/01/21 0438 10/02/21 0234  WBC 21.9*  --  20.4* 23.9* 25.4* 22.5*  NEUTROABS  --   --   --   --  20.7*  --   HGB 10.7* 10.5* 10.8* 10.5* 10.5* 9.9*  HCT 34.4* 31.0* 34.3* 33.1* 32.9* 32.0*  MCV 91.7  --  90.7 92.2 90.9 91.4  PLT 303  --  308 297 314 962    Basic Metabolic Panel: Recent Labs  Lab 09/26/21 0244 09/27/21 0448 09/27/21 0724 09/29/21 0158 09/30/21 0353 10/01/21 0438 10/02/21 0234  NA 141 138 142 132* 137 139 136  K 4.4 4.1 4.1 4.6 4.7 5.1 4.6  CL 111 109  --  105 109 107 106  CO2 21* 24  --  19* 20* 22 24  GLUCOSE 307* 138*  --  206* 217* 198* 187*   BUN 55* 55*  --  63* 62* 68* 62*  CREATININE 2.45* 1.99*  --  1.99* 2.01* 2.39* 2.36*  CALCIUM 9.5 9.7  --  9.3 9.6 9.9 9.6  MG 2.3 2.2  --   --   --  2.1  --   PHOS 3.5 3.2  --   --   --   --   --     GFR: Estimated Creatinine Clearance: 33.3 mL/min (A) (by C-G formula based on SCr of 2.36 mg/dL (H)). Liver Function Tests: No results for input(s): AST, ALT, ALKPHOS, BILITOT, PROT, ALBUMIN in the last 168 hours. No results for input(s): LIPASE, AMYLASE in the last 168 hours. No results for input(s): AMMONIA in the last 168 hours. Coagulation Profile: No results for input(s): INR, PROTIME in the last 168 hours. Cardiac Enzymes: No results for input(s): CKTOTAL, CKMB, CKMBINDEX, TROPONINI in the last 168 hours. BNP (last 3 results) No results for input(s): PROBNP in the last 8760 hours. HbA1C: No results for input(s): HGBA1C in the last  72 hours. CBG: Recent Labs  Lab 10/01/21 0319 10/01/21 0640 10/01/21 1115 10/01/21 1642 10/01/21 2220  GLUCAP 189* 182* 135* 276* 222*    Lipid Profile: No results for input(s): CHOL, HDL, LDLCALC, TRIG, CHOLHDL, LDLDIRECT in the last 72 hours. Thyroid Function Tests: No results for input(s): TSH, T4TOTAL, FREET4, T3FREE, THYROIDAB in the last 72 hours. Anemia Panel: No results for input(s): VITAMINB12, FOLATE, FERRITIN, TIBC, IRON, RETICCTPCT in the last 72 hours. Sepsis Labs: No results for input(s): PROCALCITON, LATICACIDVEN in the last 168 hours.  Recent Results (from the past 240 hour(s))  Culture, Respiratory w Gram Stain     Status: None   Collection Time: 09/24/21  9:48 AM   Specimen: Tracheal Aspirate; Respiratory  Result Value Ref Range Status   Specimen Description TRACHEAL ASPIRATE  Final   Special Requests NONE  Final   Gram Stain   Final    FEW WBC PRESENT, PREDOMINANTLY PMN NO ORGANISMS SEEN    Culture   Final    RARE Normal respiratory flora-no Staph aureus or Pseudomonas seen Performed at [facility],  [address], [place], [place] [address]    Report Status 09/26/2021 FINAL  Final  MRSA Next Gen by PCR, Nasal     Status: None   Collection Time: 09/27/21  7:50 AM   Specimen: Nasal Mucosa; Nasal Swab  Result Value Ref Range Status   MRSA by PCR Next Gen NOT DETECTED NOT DETECTED Final    Comment: (NOTE) The GeneXpert MRSA Assay (FDA approved for NASAL specimens only), is one component of a comprehensive MRSA colonization surveillance program. It is not intended to diagnose MRSA infection nor to guide or monitor treatment for MRSA infections. Test performance is not FDA approved in patients less than 28 years old. Performed at [facility], [place] [address], [place], [place] [address]   Culture, Respiratory w Gram Stain     Status: None   Collection Time: 09/27/21  2:22 PM   Specimen: Tracheal Aspirate; Respiratory  Result Value Ref Range Status   Specimen Description TRACHEAL ASPIRATE  Final   Special Requests NONE  Final   Gram Stain   Final    ABUNDANT WBC PRESENT,BOTH PMN AND MONONUCLEAR ABUNDANT GRAM NEGATIVE RODS MODERATE GRAM POSITIVE COCCI    Culture   Final    ABUNDANT Normal respiratory flora-no Staph aureus or Pseudomonas seen Performed at [facility], [address] [place], [place] [address]    Report Status 09/29/2021 FINAL  Final  Surgical PCR screen     Status: None   Collection Time: 10/01/21  8:26 AM   Specimen: Nasal Mucosa; Nasal Swab  Result Value Ref Range Status   MRSA, PCR NEGATIVE NEGATIVE Final   Staphylococcus aureus NEGATIVE NEGATIVE Final    Comment: (NOTE) The Xpert SA Assay (FDA approved for NASAL specimens in patients 14 years of age and older), is one component of a comprehensive surveillance program. It is not intended to diagnose infection nor to guide or monitor treatment. Performed at [facility], [place] [address], [place], [place] [address]   Culture, Respiratory w Gram Stain     Status: None (Preliminary result)    Collection Time: 10/01/21  4:20 PM   Specimen: Tracheal Aspirate  Result Value Ref Range Status   Specimen Description TRACHEAL ASPIRATE  Final   Special Requests NONE  Final   Gram Stain   Final    RARE SQUAMOUS EPITHELIAL CELLS PRESENT MODERATE  WBC PRESENT,BOTH PMN AND MONONUCLEAR MODERATE GRAM POSITIVE COCCI FEW GRAM POSITIVE RODS FEW GRAM NEGATIVE RODS Performed at [facility], [place] [address] [place], [place] [address]    Culture PENDING  Incomplete   Report Status PENDING  Incomplete          Radiology Studies: DG CHEST PORT 1 VIEW  Result Date: 10/02/2021 CLINICAL DATA:  Shortness of breath EXAM: PORTABLE CHEST 1 VIEW COMPARISON:  10/08/2021 FINDINGS: There are low lung volumes with asymmetric elevation of the right hemidiaphragm. Atelectasis is identified within the left lung base. No airspace consolidation. No signs of interstitial edema. IMPRESSION: 1. Low lung volumes and asymmetric elevation of the right hemidiaphragm. 2. Left base atelectasis. Electronically Signed   By: Kerby Moors M.D.   On: 10/02/2021 07:27        Scheduled Meds:  amLODipine  10 mg Oral Daily   carvedilol  25 mg Oral BID WC   chlorhexidine  15 mL Mouth Rinse BID   Chlorhexidine Gluconate Cloth  6 each Topical Daily   feeding supplement  237 mL Oral BID BM   guaiFENesin  1,200 mg Oral BID   heparin  5,000 Units Subcutaneous Q8H   hydrALAZINE  50 mg Oral Q8H   insulin aspart  0-15 Units Subcutaneous TID WC   insulin aspart  3 Units Subcutaneous TID WC   insulin detemir  30 Units Subcutaneous BID   loratadine  10 mg Oral Daily   mouth rinse  15 mL Mouth Rinse q12n4p   methylPREDNISolone (SOLU-MEDROL) injection  125 mg Intravenous Daily   mometasone-formoterol  2 puff Inhalation BID   montelukast  10 mg Oral QHS   pantoprazole  40 mg Oral Daily   QUEtiapine  50 mg Oral QHS   sodium chloride  2 spray Each Nare Q2000   Continuous Infusions:  ceFEPime (MAXIPIME) IV     [START  ON 10/04/2021] vancomycin     vancomycin       LOS: 12 days    Time spent: 35 minutes   Riva Sesma A Kaleena Corrow, MD [facility]   If 7PM-7AM, please contact night-coverage [email or web address]  10/02/2021, 11:16 AM

## 2021-10-02 NOTE — Progress Notes (Signed)
Speech Language Pathology Treatment: Dysphagia  Patient Details Name: Maureen Jones MRN: [id number] DOB: December 04, 1966 Today's Date: 10/02/2021 Time: 5465-6812 SLP Time Calculation (min) (ACUTE ONLY): 26 min  Assessment / Plan / Recommendation Clinical Impression  Pt seen this am for follow-up dysphagia therapy since instrumental assessment 10/19 and change in respiratory status. Recent CXR (10/02/21) without identification of infection related to PNA. RN reports that pt has increased WOB, although her vitals are stable. He also reports that pt consumed am meal and meds without overt difficulty. He gave OK for SLP to trial POs with her to assess for any change in swallow function. Pt with RR in low 20s on average throughout session, increasing to mid 20s with verbal communication. She self-fed large bites of regular textured meal tray items, exhibiting inconsistent overt coughing. She independently consumed small sips of thin liquids via cup, resulting in intermittent coughing with quick recovery. With reduction in size of solid boluses to 1/3 teaspoon and smaller sips of liquids, s/sx of aspiration were significantly reduced, however pt is still at risk for aspiration given increased WOB. NTL trialed x1 with delayed cough noted and subsequent pt refusal of further trials. Recommend continue current diet with implementation of small bites/sips at slow rate, taking breaks as needed given WOB. SLP to f/u to assess swallow function and diet tolerance. Please page weekend SLP if services are needed Sunday [phone]).   HPI HPI: 55 year old female admitted with AMS 10/10, intubated, stroke w/u negative. Diagnosed with acute metabolic encephalopathy in the setting of hypertensive emergency, possible PRES. Extubated but reintubated 10/12 with increased WOB, not mobilizing secretions, stridor, received steroids, NO laryngeal edema noted. Extubated 10/14 with lingering stridor, improving 10/15 am. ENT scoped pt  10/18: "Edema of the glottis and subglottic larynx following recent intubation and then repeat intubation.  There is no evidence of cord paralysis or any tumor present.  There is currently no stridor.  Allow more time for resolution of the swelling.  Unfortunately she is at risk for developing subglottic stenosis."      SLP Plan  Continue with current plan of care      Recommendations for follow up therapy are one component of a multi-disciplinary discharge planning process, led by the attending physician.  Recommendations may be updated based on patient status, additional functional criteria and insurance authorization.    Recommendations  Diet recommendations: Regular;Thin liquid Liquids provided via: Cup;No straw Medication Administration: Whole meds with liquid Supervision: Patient able to self feed Compensations: Slow rate;Small sips/bites;Minimize environmental distractions Postural Changes and/or Swallow Maneuvers: Seated upright 90 degrees                Oral Care Recommendations: Oral care BID Follow up Recommendations: Other (comment) (TBD) SLP Visit Diagnosis: Dysphagia, oropharyngeal phase (R13.12) Plan: Continue with current plan of care       [place], [place], [place] Office Number: [phone]  Acie Fredrickson  10/02/2021, 12:13 PM

## 2021-10-02 NOTE — Progress Notes (Signed)
NAMEYareth Jones, MRN:  [id number], DOB:  Apr 21, 1966, LOS: 70 ADMISSION DATE:  09/20/2021, CONSULTATION DATE: 10/10 REFERRING MD:  Dr. Laverta Baltimore, CHIEF COMPLAINT:  AMS   History of Present Illness:  55 y/o F who presented to [facility] on 10/10 with reports of acute altered mental status.   She was reportedly at home and went to smoke a cigarette 0200 with her nephew. She was apparently seen by her family at that time.  After smoking, the family heard a loud thud and she was found altered and confused.  On arrival to the ER she was noted to be agitated, confused, localized to pain and stated "that hurts" with stimulation.  She was seen initially as a CODE STROKE.  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 She was hypertensive with initial pressure of 240/112 with MAP of 136.  Initial labs showed glucose of 437, BUN 36, Sr CR 2.17, CK 316, ammonia 30, Mg 1.9, WBC 14.4. UDS was negative.  COVID & flu screening were negative.  CXR showed low lung volumes, ETT in good position, L>R interstitial opacities.    PCCM called for ICU admission.   Pertinent  Medical History  HTN DM   Significant Hospital Events: Including procedures, antibiotic start and stop dates in addition to other pertinent events   10/10 Admit with acute encephalopathy, hypertensive 10/12 extubated, increased WOB, re-intubated 10/14 extubated , lingering stridor, Received racemic epi x2 over the last 24 hours + another dose of Decadron, Mild agitation requiring restart of Precedex 10/15 swallow evaluation >> dysphagia 3 diet 10/20 decompensated after switch to PO steroids 10/21 better after CAT and back on IV steroids  Interim History / Subjective:  Agitated overnight, back on BiPAP after initially refusing. On RA during the day yesterday with good symptom control.  Objective   Blood pressure (!) 161/98, pulse 94, temperature 97.9 F (36.6 C), temperature source Oral,  resp. rate (!) 22, height 5\' 6"  (1.676 m), weight 104.6 kg, SpO2 98 %.    Vent Mode: BIPAP;PSV FiO2 (%):  [40 %] 40 % PEEP:  [5 cmH20] 5 cmH20 Pressure Support:  [7 cmH20] 7 cmH20   Intake/Output Summary (Last 24 hours) at 10/02/2021 1014 Last data filed at 10/02/2021 0400 Gross per 24 hour  Intake 820 ml  Output 2350 ml  Net -1530 ml    Filed Weights   09/29/21 0500 09/30/21 0500 10/01/21 0500  Weight: 106.1 kg 107.3 kg 104.6 kg    Examination: General: ill appearing woman sitting up in the chair, mild resp distress HENT: [place]/AT, eyes anicteric Neck: mild inspiratory stridor Lungs: wheezing with expiratory but not inspiratory accessory muscle use. Stridor better with racemic epi. Cardiovascular: S1S2, RRR Abdomen: obese, ND, NT Extremities: no c/c/e Neuro: awake, alert, moving all extremities, answering questions appropriately.  Resp culture 10/21> GPCs, few GNR, few GPR WBC 22.5 BUN 62 Cr 2.36 Bicarb 24 CXR 10/22 personally reviewed> low lung volumes, otherwise no acute abnormalities   Resolved Hospital Problem list     Assessment & Plan:   Acute respiratory failure with hypoxia RML and LLL pneumonia.  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 Previously intubated 2 times this admission, second time due to stridor, but had no laryngeal edema noted during  reintubation.  Extubated on 10/14 with ongoing hoarseness. Now confirmed glottic and subglottic edema when ENT scoped her. -wean supllemental O2 as able  -need CPAP QHS; willing to give ativan PRN -racemic epi today -duonebs Q4h> scheduled today -con't IV steroids -BiPAP PRN -con't high dose Dulera; anticipate she will need triple inhaled therapy as an outpatient.  -add vanc based  on sputum gram stain and decompensation; con't cefepime -follow repeat resp culture. -Keep high dose steroids. She has proven she needs a very slow taper. -Con't claritin and singulair. -I worry that she has poor insight into her symptom burden, which is dangerous as an asthmatic, especially with high risk of developing subglottic stenosis. These symptoms could be very similar and hard to differentiate which disease process is worsening.  Concern for anxiety- related to VCD complicating her asthma & glottic/ subglottic edema. -adding daily prozac -adding klonopin BID  Hoarseness persistent post-extubation due to subglottic edema -Appreciate ENT's management> con't aggressive GERD treatment. Dr. Constance Holster will follow her as an outpatient as well.  -Con't to monitor. If stridor is worsening will ask ENT to re-examine her if she is stable enough.   Acute hypertensive emergency > now just hypertensive Initial BP 240/112, MAP 136  Echo moderate LVH -On amlodipine 10 mg daily, coreg 25 mg BID, hydralazine 50 mg TID -management per primary  Acute Metabolic Encephalopathy due to hypertensive emergency. Imaging negative for PRES. CT head negative. MRI negative. Has had sundowning at night this admission, even nights without respiratory distress -seroquel 50mg  QHS; haldol PRN.   AKI, improving 10/2020 cr 1.7. Cr improved after gentle hydration 10/11. -per primary  DM with uncontrolled hyperglycemia -per primary; increased lantus this morning given high numbers with steroids  Acute anemia due to critical illness -per primary  PCCM will continue to follow.  Best Practice (right click and "Reselect all SmartList Selections" daily)  Per primary  Labs   CBC: Recent Labs  Lab 09/27/21 0448 09/27/21 0724 09/29/21 0158 09/30/21 0353 10/01/21 0438 10/02/21 0234  WBC 21.9*  --  20.4* 23.9* 25.4* 22.5*  NEUTROABS  --   --   --   --  20.7*  --   HGB 10.7* 10.5* 10.8* 10.5* 10.5* 9.9*  HCT  34.4* 31.0* 34.3* 33.1* 32.9* 32.0*  MCV 91.7  --  90.7 92.2 90.9 91.4  PLT 303  --  308 297 314 282     Basic Metabolic Panel: Recent Labs  Lab 09/26/21 0244 09/27/21 0448 09/27/21 0724 09/29/21 0158 09/30/21 0353 10/01/21 0438 10/02/21 0234  NA 141 138 142 132* 137 139 136  K 4.4 4.1 4.1 4.6 4.7 5.1 4.6  CL 111 109  --  105 109 107 106  CO2 21* 24  --  19* 20* 22 24  GLUCOSE 307* 138*  --  206* 217* 198* 187*  BUN 55* 55*  --  63* 62* 68* 62*  CREATININE 2.45* 1.99*  --  1.99* 2.01* 2.39* 2.36*  CALCIUM 9.5 9.7  --  9.3 9.6 9.9 9.6  MG 2.3 2.2  --   --   --  2.1  --   PHOS 3.5 3.2  --   --   --   --   --     GFR: Estimated Creatinine Clearance: 33.3 mL/min (A) (by C-G formula based on SCr of 2.36 mg/dL (H)). Recent Labs  Lab 09/29/21 0158 09/30/21 0353 10/01/21 0438 10/02/21 0234  WBC 20.4* 23.9* 25.4* 22.5*     Liver Function Tests:  No results for input(s): AST, ALT, ALKPHOS, BILITOT, PROT, ALBUMIN in the last 168 hours.  No results for input(s): LIPASE, AMYLASE in the last 168 hours. No results for input(s): AMMONIA in the last 168 hours.   ABG    Component Value Date/Time   PHART 7.345 (L) 09/27/2021 0724   PCO2ART 47.0 09/27/2021 0724   PO2ART 73 (L) 09/27/2021 0724   HCO3 25.7 09/27/2021 0724   TCO2 27 09/27/2021 0724   O2SAT 94.0 09/27/2021 0724    This patient is critically ill with multiple organ system failure which requires frequent high complexity decision making, assessment, support, evaluation, and titration of therapies. This was completed through the application of advanced monitoring technologies and extensive interpretation of multiple databases. During this encounter critical care time was devoted to patient care services described in this note for 40 minutes.    Julian Hy, DO 10/02/21 10:14 AM [facility]

## 2021-10-02 NOTE — Progress Notes (Signed)
ABG results not crossing over into Epic.   7.35/45/105/25.4/98% on BIPAP 12/5 40%

## 2021-10-02 NOTE — Progress Notes (Signed)
Pt agreeing to wear BIPAP tonight.

## 2021-10-02 NOTE — Progress Notes (Signed)
Pharmacy Antibiotic Note  Maureen Jones is a 55 y.o. female admitted on 09/20/2021 with hypertensive encephalopathy. Pt previously completed course of ceftriaxone. Pharmacy has been consulted for vancomycin and cefepime dosing with ongoing respiratory distress.  Vancomycin stopped 10/17.  Patient with increased WOB - back on Bipap this morning. WBC 22.5, afebrile. Scr 2.36 (CrCl 33 mL/min). Still on IV steroids. Most recent trach aspirate yesterday still in process, MRSA PCR negative - will adjust based abx on trach aspirate.   Plan: Ordered vancomycin 2g IV once then 1250 mg IV every 48 hours (estAUC 467) Continue Cefepime 2g IV q12h Watch renal function, cultures and clinical course.  Height: 5\' 6"  (167.6 cm) Weight: 104.6 kg (230 lb 9.6 oz) IBW/kg (Calculated) : 59.3  Temp (24hrs), Avg:98.1 F (36.7 C), Min:97.9 F (36.6 C), Max:98.4 F (36.9 C)  Recent Labs  Lab 09/27/21 0448 09/29/21 0158 09/30/21 0353 10/01/21 0438 10/02/21 0234  WBC 21.9* 20.4* 23.9* 25.4* 22.5*  CREATININE 1.99* 1.99* 2.01* 2.39* 2.36*     Estimated Creatinine Clearance: 33.3 mL/min (A) (by C-G formula based on SCr of 2.36 mg/dL (H)).    No Known Allergies  Antimicrobials this admission: Ceftriaxone 10/11 >> 10/15 Cefepime 10/17 >> Vancomycin 10/17 >> 10/17, 10/22>>  Microbiology results: 10/10 BCx: negative 10/14 TA: negative 10/17 MRSA PCR: neg 10/17 TA: abundant norm flora 10/21 MRSA PCR: neg 10/21 TA: pending  Thank you for allowing pharmacy to be a part of this patient's care.  Antonietta Jewel, PharmD, [facility] Clinical Pharmacist  Phone: [phone] 10/02/2021 9:43 AM  Please check AMION for all [place] phone numbers After 10:00 PM, call [place] [phone]

## 2021-10-03 DIAGNOSIS — G9340 Encephalopathy, unspecified: Secondary | ICD-10-CM | POA: Diagnosis not present

## 2021-10-03 LAB — CBC
HCT: 31.8 % — ABNORMAL LOW (ref 36.0–46.0)
Hemoglobin: 9.8 g/dL — ABNORMAL LOW (ref 12.0–15.0)
MCH: 28.7 pg (ref 26.0–34.0)
MCHC: 30.8 g/dL (ref 30.0–36.0)
MCV: 93.3 fL (ref 80.0–100.0)
Platelets: 274 10*3/uL (ref 150–400)
RBC: 3.41 MIL/uL — ABNORMAL LOW (ref 3.87–5.11)
RDW: 13.5 % (ref 11.5–15.5)
WBC: 21.5 10*3/uL — ABNORMAL HIGH (ref 4.0–10.5)
nRBC: 0 % (ref 0.0–0.2)

## 2021-10-03 LAB — POCT I-STAT 7, (LYTES, BLD GAS, ICA,H+H)
Acid-Base Excess: 0 mmol/L (ref 0.0–2.0)
Bicarbonate: 25.4 mmol/L (ref 20.0–28.0)
Calcium, Ion: 1.38 mmol/L (ref 1.15–1.40)
HCT: 32 % — ABNORMAL LOW (ref 36.0–46.0)
Hemoglobin: 10.9 g/dL — ABNORMAL LOW (ref 12.0–15.0)
O2 Saturation: 98 %
Patient temperature: 98.3
Potassium: 4.5 mmol/L (ref 3.5–5.1)
Sodium: 137 mmol/L (ref 135–145)
TCO2: 27 mmol/L (ref 22–32)
pCO2 arterial: 45.2 mmHg (ref 32.0–48.0)
pH, Arterial: 7.357 (ref 7.350–7.450)
pO2, Arterial: 105 mmHg (ref 83.0–108.0)

## 2021-10-03 LAB — BASIC METABOLIC PANEL
Anion gap: 7 (ref 5–15)
BUN: 59 mg/dL — ABNORMAL HIGH (ref 6–20)
CO2: 22 mmol/L (ref 22–32)
Calcium: 9.8 mg/dL (ref 8.9–10.3)
Chloride: 108 mmol/L (ref 98–111)
Creatinine, Ser: 2.26 mg/dL — ABNORMAL HIGH (ref 0.44–1.00)
GFR, Estimated: 25 mL/min — ABNORMAL LOW (ref >60)
Glucose, Bld: 162 mg/dL — ABNORMAL HIGH (ref 70–99)
Potassium: 5.2 mmol/L — ABNORMAL HIGH (ref 3.5–5.1)
Sodium: 137 mmol/L (ref 135–145)

## 2021-10-03 LAB — CULTURE, RESPIRATORY W GRAM STAIN: Culture: NORMAL

## 2021-10-03 LAB — GLUCOSE, CAPILLARY
Glucose-Capillary: 178 mg/dL — ABNORMAL HIGH (ref 70–99)
Glucose-Capillary: 185 mg/dL — ABNORMAL HIGH (ref 70–99)
Glucose-Capillary: 253 mg/dL — ABNORMAL HIGH (ref 70–99)
Glucose-Capillary: 271 mg/dL — ABNORMAL HIGH (ref 70–99)
Glucose-Capillary: 148 mg/dL — ABNORMAL HIGH (ref 70–99)
Glucose-Capillary: 183 mg/dL — ABNORMAL HIGH (ref 70–99)
Glucose-Capillary: 233 mg/dL — ABNORMAL HIGH (ref 70–99)
Glucose-Capillary: 310 mg/dL — ABNORMAL HIGH (ref 70–99)
Glucose-Capillary: 304 mg/dL — ABNORMAL HIGH (ref 70–99)

## 2021-10-03 MED ORDER — SODIUM ZIRCONIUM CYCLOSILICATE 5 G PO PACK
5.0000 g | PACK | Freq: Once | ORAL | Status: AC
  Administered 2021-10-03: 5 g via ORAL
  Filled 2021-10-03: qty 1

## 2021-10-03 NOTE — Progress Notes (Signed)
NAMEAdyline Jones, MRN:  [id number], DOB:  02/15/1966, LOS: 84 ADMISSION DATE:  09/20/2021, CONSULTATION DATE: 10/10 REFERRING MD:  Dr. Laverta Baltimore, CHIEF COMPLAINT:  AMS   History of Present Illness:  55 y/o F who presented to [facility] on 10/10 with reports of acute altered mental status.   She was reportedly at home and went to smoke a cigarette 0200 with her nephew. She was apparently seen by her family at that time.  After smoking, the family heard a loud thud and she was found altered and confused.  On arrival to the ER she was noted to be agitated, confused, localized to pain and stated "that hurts" with stimulation.  She was seen initially as a CODE STROKE.  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 She was hypertensive with initial pressure of 240/112 with MAP of 136.  Initial labs showed glucose of 437, BUN 36, Sr CR 2.17, CK 316, ammonia 30, Mg 1.9, WBC 14.4. UDS was negative.  COVID & flu screening were negative.  CXR showed low lung volumes, ETT in good position, L>R interstitial opacities.    PCCM called for ICU admission.   Pertinent  Medical History  HTN DM   Significant Hospital Events: Including procedures, antibiotic start and stop dates in addition to other pertinent events   10/10 Admit with acute encephalopathy, hypertensive 10/12 extubated, increased WOB, re-intubated 10/14 extubated , lingering stridor, Received racemic epi x2 over the last 24 hours + another dose of Decadron, Mild agitation requiring restart of Precedex 10/15 swallow evaluation >> dysphagia 3 diet 10/20 decompensated after switch to PO steroids 10/21 better after CAT and back on IV steroids  Interim History / Subjective:  Her breathing is better than yesterday. She wore bipap overnight.  Objective   Blood pressure (!) 204/81, pulse 73, temperature 97.9 F (36.6 C), temperature source Axillary, resp. rate (!) 21, height 5\' 6"  (1.676 m),  weight 104.6 kg, SpO2 100 %.    Vent Mode: BIPAP;PSV FiO2 (%):  [40 %] 40 % PEEP:  [5 cmH20] 5 cmH20 Pressure Support:  [10 cmH20] 10 cmH20   Intake/Output Summary (Last 24 hours) at 10/03/2021 0708 Last data filed at 10/03/2021 0600 Gross per 24 hour  Intake 700 ml  Output 500 ml  Net 200 ml    Filed Weights   09/29/21 0500 09/30/21 0500 10/01/21 0500  Weight: 106.1 kg 107.3 kg 104.6 kg    Examination: General: middle aged woman sitting up in bed in NAD HENT: [place]/AT, eyes anicteric Lungs: No wheezing, still hoarse. No accessory muscle use. 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 Previously intubated 2 times this admission, second time due to stridor, but had no laryngeal edema noted during reintubation.  Extubated on 10/14 with ongoing hoarseness. Now confirmed glottic and subglottic edema when ENT scoped her. -wean O2 as able -CPAP QHS; ativan PRN to help with her tolerating it -Racemic epi  nebs PRN -duonebs Q4h> continued scheduled -Con't IV steroids -Con't high dose Dulera; she will need high dose ICS and triple inhaled therapy -con't cefepime and vanc; follow resp culture results -Keep high dose steroids. She has proven she needs a very slow taper.  -Con't claritin and singulair. -I worry that she has poor insight into her symptom burden, which is dangerous as an asthmatic, especially with  high risk of developing subglottic stenosis. These symptoms could be very similar and hard to differentiate which disease process is worsening.  Concern for anxiety- related to VCD complicating her asthma & glottic/ subglottic edema. -con't daily prozac; added 10/22 -con't  klonopin BID, added 10/22  Hoarseness persistent post-extubation due to subglottic edema -Appreciate ENT's management> con't aggressive GERD treatment. Dr. Constance Holster will follow her as an outpatient as well.  -Con't to monitor. If stridor is worsening will ask ENT to re-examine her if she is stable enough. Con't to monitor at this time as she is breathing adequately.  Acute hypertensive emergency > now just hypertensive Initial BP 240/112, MAP 136  Echo moderate LVH -On amlodipine, coreg , hydralazine -management per primary  Acute Metabolic Encephalopathy due to hypertensive emergency. Imaging negative for PRES. CT head negative. MRI negative. Has had sundowning at night this admission, even nights without respiratory distress -seroquel 50mg  QHS; haldol PRN.   AKI, improving 10/2020 cr 1.7. Cr improved after gentle hydration 10/11. -per primary; repeat BMP pending  DM with uncontrolled hyperglycemia -per primary  Acute anemia due to critical illness -per primary  PCCM will continue to follow. Lateral to 35M today.  Best Practice (right click and "Reselect all SmartList Selections" daily)  Per primary  Labs   CBC: Recent Labs  Lab 09/27/21 0448 09/27/21 0724 09/29/21 0158 09/30/21 0353 10/01/21 0438 10/02/21 0234  WBC 21.9*  --  20.4* 23.9* 25.4* 22.5*  NEUTROABS  --   --   --   --  20.7*  --   HGB 10.7* 10.5* 10.8* 10.5* 10.5* 9.9*  HCT 34.4* 31.0* 34.3* 33.1* 32.9* 32.0*  MCV 91.7  --  90.7 92.2 90.9 91.4  PLT 303  --  308 297 314 282     Basic Metabolic Panel: Recent Labs  Lab 09/27/21 0448 09/27/21 0724 09/29/21 0158 09/30/21 0353 10/01/21 0438 10/02/21 0234  NA 138 142 132* 137 139  136  K 4.1 4.1 4.6 4.7 5.1 4.6  CL 109  --  105 109 107 106  CO2 24  --  19* 20* 22 24  GLUCOSE 138*  --  206* 217* 198* 187*  BUN 55*  --  63* 62* 68* 62*  CREATININE 1.99*  --  1.99* 2.01* 2.39* 2.36*  CALCIUM 9.7  --  9.3 9.6 9.9 9.6  MG 2.2  --   --   --  2.1  --   PHOS 3.2  --   --   --   --   --     GFR: Estimated Creatinine Clearance: 33.3 mL/min (A) (by C-G formula based on SCr of 2.36 mg/dL (H)). Recent Labs  Lab 09/29/21 0158 09/30/21 0353 10/01/21 0438 10/02/21 0234  WBC 20.4* 23.9* 25.4* 22.5*     Liver Function Tests: No results for input(s): AST, ALT, ALKPHOS, BILITOT, PROT, ALBUMIN in the last 168 hours.  No results for input(s): LIPASE, AMYLASE in the last 168 hours. No results for input(s): AMMONIA in the last 168 hours.   ABG    Component Value Date/Time   PHART  7.345 (L) 09/27/2021 0724   PCO2ART 47.0 09/27/2021 0724   PO2ART 73 (L) 09/27/2021 0724   HCO3 25.7 09/27/2021 0724   TCO2 27 09/27/2021 0724   O2SAT 94.0 09/27/2021 Lane Deneene Tarver, DO 10/03/21 8:50 AM [facility]

## 2021-10-03 NOTE — Progress Notes (Signed)
[place] Progress Note Patient Name: Maureen Jones DOB: Dec 07, 1966 MRN: [id number]   Date of Service  10/03/2021  HPI/Events of Note  Hyperglycemia - Patient on Levemir + AC moderate Novolog SSI. Currently on AC/HS and Q 4 hour POC blood glucose checks.  eICU Interventions  Will D/C Q 4 hour POC blood glucose checks.      Intervention Category Major Interventions: Hyperglycemia - active titration of insulin therapy  Danuta Huseman [place] 10/03/2021, 8:30 PM

## 2021-10-03 NOTE — Progress Notes (Signed)
PROGRESS NOTE    Maureen Jones  TFT:[id number] DOB: 21-Aug-1966 DOA: 09/20/2021 PCP: Pcp, No   Brief Narrative: 55 year old with past medical history significant for hypertension and diabetes who presented to [facility] on 10/10 with acute altered mental status.  She was reportedly at home and went to smoke a cigarette at 2 AM with her nephew.  She was apparently seen by her family at that time.  After smoking, the family heard a loud noise and she was found altered and confused.  On arrival to the ER she was noted to be agitated, confused.  She was seen initially as a code stroke.  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  She was hypertensive with initial pressure of 240/ 112.  CBG 437, creatinine 2.1, ammonia 30, white blood cell 14, UDS was negative.  Chest x-ray showed low lung volume.  Patient was intubated on admission for acute encephalopathy.  On 10/12 patient was extubated, due to increased work of breathing she was reintubated.  On 10/14 she was extubated, she had some stridors, received racemic epi, she was transiently on Precedex for agitation.  She has remained stable and she was transitioned to [facility] on 10/19.  He was evaluated by ENT who recommended treatment for reflux, patient has some mild edema of the glottis and supraglottic larynx post intubation.   Assessment & Plan:   Active Problems:   Encephalopathy acute   Endotracheal tube present  1-Acute Hypoxic Respiratory Failure: related to asthma, PNA.  -Initially admitted for altered mental status, airway protection. -Previously intubated 2 times during this admission, second time due to her stridor, but no significant laryngeal edema noted during reintubation.  Extubated on 10/14 with ongoing hoarseness.  ENT consulted.  Mild edema glottis and supraglottic larynx.  ENT recommended PPI and follow-up as an outpatient.  Patient is at risk for developing subglottic  stenosis. -Chest x-ray concerning for right middle lobe and lower lobe pneumonia.   -On Cefepime, vancomycin started 10/22. Gram positive cocci in sputum.  -On Daily Claritin and Singulair. -She has history of asthma, she would need at discharge Dulera, Spiriva, -Chest x ray vascular congestion. She has received lasix doses.  -ECHO NL ef, Diastolic parameter indeterminate.  - 10/22: BIPAP PRN, worsening work of breathing overnight. Received dose racemic epinephrine and inhale which help per nurse.  -patien timproved today, plan to continue with current management.   2-Hoarseness: Persistent postextubation due to subglottic edema. ENT consulted recommended aggressive care for GERD. Protonix twice daily Needs to  follow-up with ENT as an outpatient.  Acute hypertensive emergency: Initial blood pressure 240/112 ECHO; Moderate left ventricular hypertrophy Continue with amlodipine, Coreg,hydralazine Hold cozaar due to AKI.   Acute metabolic encephalopathy due to hypertensive emergency: CT head negative MRI negative. sundown at night. Continue with Seroquel Ativan PRN>   Dysphagia: Evaluated by speech today who recommended regular solids and thin liquids diet. Leukocytosis : Suspect related to steroid.  On IV antibiotics as well. Vancomycin added. 10/22  AKI: Improved monitor. Cr: 1.9.  Peak to 2.4. Hold Cozaar.  Diabetes uncontrolled hyperglycemia: Continue with Levemir 25 units twice daily.   meal coverage.  Anemia of critical illness; hb stable.  Mild hyperkalemia; will give lokelma.   Nutrition Problem: Inadequate oral intake Etiology: inability to eat    Signs/Symptoms: NPO status    Interventions: Ensure Enlive (each supplement provides 350kcal and 20 grams of protein), MVI  Estimated body mass index  is 37.22 kg/m as calculated from the following:   Height as of this encounter: 5\' 6"  (1.676 m).   Weight as of this encounter: 104.6 kg.   DVT prophylaxis:  Heparin Code Status: Full code Family Communication: Disposition Plan:  Status is: Inpatient  Remains inpatient appropriate because: Uncontrolled hypertension, respiratory failure, hyperglycemia        Consultants:  CM admitted patient ENT  Procedures:    Antimicrobials:    Subjective: She is resting on BIPAP. She report less SOB  Objective: Vitals:   10/03/21 0400 10/03/21 0408 10/03/21 0500 10/03/21 0600  BP:      Pulse: 79  73   Resp: 18  16 (!) 21  Temp: 97.9 F (36.6 C)     TempSrc: Axillary     SpO2: 100% 100% 100% 100%  Weight:      Height:        Intake/Output Summary (Last 24 hours) at 10/03/2021 0815 Last data filed at 10/03/2021 0600 Gross per 24 hour  Intake 700 ml  Output 500 ml  Net 200 ml    Filed Weights   09/29/21 0500 09/30/21 0500 10/01/21 0500  Weight: 106.1 kg 107.3 kg 104.6 kg    Examination:  General exam: NAD Respiratory system: BL ronchus, on BIPAP>  Cardiovascular system: S 1, S 2 RRR Gastrointestinal system: BS present, soft, nt Central nervous system: Alert Extremities; No edema  Data Reviewed: I have personally reviewed following labs and imaging studies  CBC: Recent Labs  Lab 09/27/21 0448 09/27/21 0724 09/29/21 0158 09/30/21 0353 10/01/21 0438 10/02/21 0234  WBC 21.9*  --  20.4* 23.9* 25.4* 22.5*  NEUTROABS  --   --   --   --  20.7*  --   HGB 10.7* 10.5* 10.8* 10.5* 10.5* 9.9*  HCT 34.4* 31.0* 34.3* 33.1* 32.9* 32.0*  MCV 91.7  --  90.7 92.2 90.9 91.4  PLT 303  --  308 297 314 453    Basic Metabolic Panel: Recent Labs  Lab 09/27/21 0448 09/27/21 0724 09/29/21 0158 09/30/21 0353 10/01/21 0438 10/02/21 0234  NA 138 142 132* 137 139 136  K 4.1 4.1 4.6 4.7 5.1 4.6  CL 109  --  105 109 107 106  CO2 24  --  19* 20* 22 24  GLUCOSE 138*  --  206* 217* 198* 187*  BUN 55*  --  63* 62* 68* 62*  CREATININE 1.99*  --  1.99* 2.01* 2.39* 2.36*  CALCIUM 9.7  --  9.3 9.6 9.9 9.6  MG 2.2  --   --   --  2.1   --   PHOS 3.2  --   --   --   --   --     GFR: Estimated Creatinine Clearance: 33.3 mL/min (A) (by C-G formula based on SCr of 2.36 mg/dL (H)). Liver Function Tests: No results for input(s): AST, ALT, ALKPHOS, BILITOT, PROT, ALBUMIN in the last 168 hours. No results for input(s): LIPASE, AMYLASE in the last 168 hours. No results for input(s): AMMONIA in the last 168 hours. Coagulation Profile: No results for input(s): INR, PROTIME in the last 168 hours. Cardiac Enzymes: No results for input(s): CKTOTAL, CKMB, CKMBINDEX, TROPONINI in the last 168 hours. BNP (last 3 results) No results for input(s): PROBNP in the last 8760 hours. HbA1C: No results for input(s): HGBA1C in the last 72 hours. CBG: Recent Labs  Lab 10/01/21 0640 10/01/21 1115 10/01/21 1642 10/01/21 2220 10/02/21 2122  GLUCAP 182* 135*  276* 222* 265*    Lipid Profile: No results for input(s): CHOL, HDL, LDLCALC, TRIG, CHOLHDL, LDLDIRECT in the last 72 hours. Thyroid Function Tests: No results for input(s): TSH, T4TOTAL, FREET4, T3FREE, THYROIDAB in the last 72 hours. Anemia Panel: No results for input(s): VITAMINB12, FOLATE, FERRITIN, TIBC, IRON, RETICCTPCT in the last 72 hours. Sepsis Labs: No results for input(s): PROCALCITON, LATICACIDVEN in the last 168 hours.  Recent Results (from the past 240 hour(s))  Culture, Respiratory w Gram Stain     Status: None   Collection Time: 09/24/21  9:48 AM   Specimen: Tracheal Aspirate; Respiratory  Result Value Ref Range Status   Specimen Description TRACHEAL ASPIRATE  Final   Special Requests NONE  Final   Gram Stain   Final    FEW WBC PRESENT, PREDOMINANTLY PMN NO ORGANISMS SEEN    Culture   Final    RARE Normal respiratory flora-no Staph aureus or Pseudomonas seen Performed at [facility], [address] [place], [place] [address]    Report Status 09/26/2021 FINAL  Final  MRSA Next Gen by PCR, Nasal     Status: None   Collection Time: 09/27/21  7:50  AM   Specimen: Nasal Mucosa; Nasal Swab  Result Value Ref Range Status   MRSA by PCR Next Gen NOT DETECTED NOT DETECTED Final    Comment: (NOTE) The GeneXpert MRSA Assay (FDA approved for NASAL specimens only), is one component of a comprehensive MRSA colonization surveillance program. It is not intended to diagnose MRSA infection nor to guide or monitor treatment for MRSA infections. Test performance is not FDA approved in patients less than 86 years old. Performed at [facility], [place] [address], [place], [place] [address]   Culture, Respiratory w Gram Stain     Status: None   Collection Time: 09/27/21  2:22 PM   Specimen: Tracheal Aspirate; Respiratory  Result Value Ref Range Status   Specimen Description TRACHEAL ASPIRATE  Final   Special Requests NONE  Final   Gram Stain   Final    ABUNDANT WBC PRESENT,BOTH PMN AND MONONUCLEAR ABUNDANT GRAM NEGATIVE RODS MODERATE GRAM POSITIVE COCCI    Culture   Final    ABUNDANT Normal respiratory flora-no Staph aureus or Pseudomonas seen Performed at [facility], [address] [place], [place] [address]    Report Status 09/29/2021 FINAL  Final  Surgical PCR screen     Status: None   Collection Time: 10/01/21  8:26 AM   Specimen: Nasal Mucosa; Nasal Swab  Result Value Ref Range Status   MRSA, PCR NEGATIVE NEGATIVE Final   Staphylococcus aureus NEGATIVE NEGATIVE Final    Comment: (NOTE) The Xpert SA Assay (FDA approved for NASAL specimens in patients 66 years of age and older), is one component of a comprehensive surveillance program. It is not intended to diagnose infection nor to guide or monitor treatment. Performed at [facility], [place] [address], [place], [place] [address]   Culture, Respiratory w Gram Stain     Status: None (Preliminary result)   Collection Time: 10/01/21  4:20 PM   Specimen: Tracheal Aspirate  Result Value Ref Range Status   Specimen Description TRACHEAL ASPIRATE  Final   Special  Requests NONE  Final   Gram Stain   Final    RARE SQUAMOUS EPITHELIAL CELLS PRESENT MODERATE WBC PRESENT,BOTH PMN AND MONONUCLEAR MODERATE GRAM POSITIVE COCCI FEW GRAM POSITIVE RODS FEW GRAM NEGATIVE RODS    Culture  Final    CULTURE REINCUBATED FOR BETTER GROWTH Performed at [facility], [place] [address], [place], [place] [address]    Report Status PENDING  Incomplete          Radiology Studies: DG CHEST PORT 1 VIEW  Result Date: 10/02/2021 CLINICAL DATA:  Shortness of breath EXAM: PORTABLE CHEST 1 VIEW COMPARISON:  10/08/2021 FINDINGS: There are low lung volumes with asymmetric elevation of the right hemidiaphragm. Atelectasis is identified within the left lung base. No airspace consolidation. No signs of interstitial edema. IMPRESSION: 1. Low lung volumes and asymmetric elevation of the right hemidiaphragm. 2. Left base atelectasis. Electronically Signed   By: Kerby Moors M.D.   On: 10/02/2021 07:27        Scheduled Meds:  amLODipine  10 mg Oral Daily   carvedilol  25 mg Oral BID WC   chlorhexidine  15 mL Mouth Rinse BID   Chlorhexidine Gluconate Cloth  6 each Topical Daily   clonazePAM  1 mg Oral BID   feeding supplement  237 mL Oral BID BM   FLUoxetine  10 mg Oral Daily   guaiFENesin  1,200 mg Oral BID   heparin  5,000 Units Subcutaneous Q8H   hydrALAZINE  50 mg Oral Q8H   insulin aspart  0-15 Units Subcutaneous TID WC   insulin aspart  3 Units Subcutaneous TID WC   insulin detemir  30 Units Subcutaneous BID   ipratropium-albuterol  3 mL Nebulization Q4H   loratadine  10 mg Oral Daily   LORazepam  0.5 mg Intravenous QHS   mouth rinse  15 mL Mouth Rinse q12n4p   methylPREDNISolone (SOLU-MEDROL) injection  125 mg Intravenous Daily   mometasone-formoterol  2 puff Inhalation BID   montelukast  10 mg Oral QHS   pantoprazole  40 mg Oral Daily   QUEtiapine  50 mg Oral QHS   sodium chloride  2 spray Each Nare Q2000   Continuous Infusions:  ceFEPime  (MAXIPIME) IV Stopped (10/02/21 2322)   [START ON 10/04/2021] vancomycin       LOS: 13 days    Time spent: 35 minutes   Ziya Coonrod A Anayansi Rundquist, MD [facility]   If 7PM-7AM, please contact night-coverage [email or web address]  10/03/2021, 8:15 AM

## 2021-10-03 NOTE — Plan of Care (Signed)
  Problem: Health Behavior/Discharge Planning: Goal: Ability to manage health-related needs will improve Outcome: Progressing   Problem: Clinical Measurements: Goal: Ability to maintain clinical measurements within normal limits will improve Outcome: Progressing Goal: Will remain free from infection Outcome: Progressing Goal: Diagnostic test results will improve Outcome: Progressing Goal: Respiratory complications will improve Outcome: Progressing Goal: Cardiovascular complication will be avoided Outcome: Progressing   Problem: Activity: Goal: Risk for activity intolerance will decrease Outcome: Progressing   Problem: Nutrition: Goal: Adequate nutrition will be maintained Outcome: Progressing   Problem: Elimination: Goal: Will not experience complications related to bowel motility Outcome: Progressing Goal: Will not experience complications related to urinary retention Outcome: Progressing   Problem: Pain Managment: Goal: General experience of comfort will improve Outcome: Progressing   Problem: Activity: Goal: Ability to tolerate increased activity will improve Outcome: Progressing   Problem: Coping: Goal: Will verbalize positive feelings about self Outcome: Progressing Goal: Will identify appropriate support needs Outcome: Progressing   Problem: Health Behavior/Discharge Planning: Goal: Ability to manage health-related needs will improve Outcome: Progressing   Problem: Self-Care: Goal: Ability to participate in self-care as condition permits will improve Outcome: Progressing Goal: Verbalization of feelings and concerns over difficulty with self-care will improve Outcome: Progressing   Problem: Nutrition: Goal: Risk of aspiration will decrease Outcome: Progressing Goal: Dietary intake will improve Outcome: Progressing   Problem: Intracerebral Hemorrhage Tissue Perfusion: Goal: Complications of Intracerebral Hemorrhage will be minimized Outcome:  Progressing

## 2021-10-04 ENCOUNTER — Inpatient Hospital Stay (HOSPITAL_COMMUNITY): Payer: Medicare Other

## 2021-10-04 DIAGNOSIS — G9340 Encephalopathy, unspecified: Secondary | ICD-10-CM | POA: Diagnosis not present

## 2021-10-04 DIAGNOSIS — J9600 Acute respiratory failure, unspecified whether with hypoxia or hypercapnia: Secondary | ICD-10-CM

## 2021-10-04 LAB — GLUCOSE, CAPILLARY
Glucose-Capillary: 126 mg/dL — ABNORMAL HIGH (ref 70–99)
Glucose-Capillary: 142 mg/dL — ABNORMAL HIGH (ref 70–99)
Glucose-Capillary: 239 mg/dL — ABNORMAL HIGH (ref 70–99)
Glucose-Capillary: 284 mg/dL — ABNORMAL HIGH (ref 70–99)

## 2021-10-04 LAB — CBC
HCT: 32.2 % — ABNORMAL LOW (ref 36.0–46.0)
Hemoglobin: 9.9 g/dL — ABNORMAL LOW (ref 12.0–15.0)
MCH: 28.7 pg (ref 26.0–34.0)
MCHC: 30.7 g/dL (ref 30.0–36.0)
MCV: 93.3 fL (ref 80.0–100.0)
Platelets: 266 10*3/uL (ref 150–400)
RBC: 3.45 MIL/uL — ABNORMAL LOW (ref 3.87–5.11)
RDW: 13.6 % (ref 11.5–15.5)
WBC: 23.3 10*3/uL — ABNORMAL HIGH (ref 4.0–10.5)
nRBC: 0 % (ref 0.0–0.2)

## 2021-10-04 LAB — BASIC METABOLIC PANEL
Anion gap: 7 (ref 5–15)
BUN: 58 mg/dL — ABNORMAL HIGH (ref 6–20)
CO2: 22 mmol/L (ref 22–32)
Calcium: 9.8 mg/dL (ref 8.9–10.3)
Chloride: 107 mmol/L (ref 98–111)
Creatinine, Ser: 2.36 mg/dL — ABNORMAL HIGH (ref 0.44–1.00)
GFR, Estimated: 24 mL/min — ABNORMAL LOW (ref >60)
Glucose, Bld: 189 mg/dL — ABNORMAL HIGH (ref 70–99)
Potassium: 5 mmol/L (ref 3.5–5.1)
Sodium: 136 mmol/L (ref 135–145)

## 2021-10-04 MED ORDER — METHYLPREDNISOLONE SODIUM SUCC 125 MG IJ SOLR
80.0000 mg | Freq: Every day | INTRAMUSCULAR | Status: DC
  Administered 2021-10-04: 80 mg via INTRAVENOUS
  Filled 2021-10-04: qty 2

## 2021-10-04 MED ORDER — INSULIN ASPART 100 UNIT/ML IJ SOLN
4.0000 [IU] | Freq: Three times a day (TID) | INTRAMUSCULAR | Status: DC
  Administered 2021-10-04 (×3): 4 [IU] via SUBCUTANEOUS

## 2021-10-04 MED ORDER — LORAZEPAM 2 MG/ML IJ SOLN
0.5000 mg | Freq: Every evening | INTRAMUSCULAR | Status: DC | PRN
  Administered 2021-10-05: 0.5 mg via INTRAVENOUS
  Filled 2021-10-04: qty 1

## 2021-10-04 MED ORDER — HYDRALAZINE HCL 50 MG PO TABS
75.0000 mg | ORAL_TABLET | Freq: Three times a day (TID) | ORAL | Status: DC
  Administered 2021-10-04: 75 mg via ORAL
  Filled 2021-10-04: qty 2

## 2021-10-04 NOTE — Progress Notes (Signed)
RT note: Pt placed on Bipap for the night 10/5 after 10-15 min pt took Bipap off and stated she did not want to wear it tonight. Pt back on [place].

## 2021-10-04 NOTE — Progress Notes (Signed)
RT attempted to place pt on bipap for the night, pt refusing at this time.

## 2021-10-04 NOTE — Progress Notes (Signed)
Patient ID: Maureen Jones, female   DOB: February 05, 1966, 55 y.o.   MRN: [id number] Asked to reevaluate the airway.  She is sitting up in bed talking to somebody on the telephone.  She is moving air well without any stridor or obstruction.  Her voice is a little bit hoarse but much improved compared to the last visit I had with her.  I reviewed the CT scan.  The subglottic airway narrows slightly but not critically.  Clinically she seems to be improving.  No airway intervention needed.  Call for additional problems.

## 2021-10-04 NOTE — Progress Notes (Signed)
Physical Therapy Treatment Patient Details Name: Maureen Jones MRN: [id number] DOB: [birth year] Today's Date: 10/04/2021   History of Present Illness Pt is a 55 y.o. female admitted 09/20/21 with AMS. 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 CXR showed low lung volumes, L>R interstitial opacities. Workup for acute metabolic encephalopathy in setting of hypertensive urgency, possible PRES. ETT 10/10-10/12, reintubated 10/12-10/14. Course complicated by confusion/agitation overnight 10/17. PMH includes HTN, DM.    PT Comments    Pt received in bed, wheezing and increased WOB noted on 8L. SpO2 98-100%. Pt required supervision bed mobility, min guard assist transfers, and min guard assist amb 3' without AD (bed to recliner). Pt performed LE exercises in recliner, seated and reclined with feet elevated. Wheezing and increased WOB noted throughout session with SpO2 sustained 98-100%. 2/4 DOE with minimal activity. Pt remained in recliner at end of session.     Recommendations for follow up therapy are one component of a multi-disciplinary discharge planning process, led by the attending physician.  Recommendations may be updated based on patient status, additional functional criteria and insurance authorization.  Follow Up Recommendations  Home health PT     Assistance Recommended at Discharge Frequent or constant Supervision/Assistance  Equipment Recommendations  3in1 (PT) (possible rollator (TBD))    Recommendations for Other Services       Precautions / Restrictions Precautions Precautions: Fall;Other (comment) Precaution Comments: Urinary urgency     Mobility  Bed Mobility Overal bed mobility: Needs Assistance Bed Mobility: Supine to Sit     Supine to sit: Supervision;HOB elevated     General bed mobility comments: supervision for safety and lines, HOB up    Transfers Overall transfer level: Needs assistance Equipment used: None Transfers: Sit  to/from Stand Sit to Stand: Min guard                Ambulation/Gait Ambulation/Gait assistance: Min guard Gait Distance (Feet): 3 Feet Assistive device: None Gait Pattern/deviations: Step-through pattern;Decreased stride length     General Gait Details: amb bed to recliner   Stairs             Wheelchair Mobility    Modified Rankin (Stroke Patients Only)       Balance Overall balance assessment: Needs assistance Sitting-balance support: No upper extremity supported;Feet supported Sitting balance-[name]: Good     Standing balance support: No upper extremity supported;During functional activity Standing balance-[name]: Fair Standing balance comment: fair static. Min assist for dynamic increased distances                            Cognition Arousal/Alertness: Awake/alert Behavior During Therapy: WFL for tasks assessed/performed Overall Cognitive Status: Within Functional Limits for tasks assessed                                          Exercises General Exercises - Lower Extremity Ankle Circles/Pumps: AROM;Both;10 reps;Seated Long Arc Quad: AROM;Right;Left;10 reps;Seated Straight Leg Raises: AROM;Right;Left;10 reps;Supine Hip Flexion/Marching: AROM;Right;Left;10 reps;Seated    General Comments General comments (skin integrity, edema, etc.): BP/HR stable. SpO2 98-100% on 8L. Wheezing noted throughout session and increased WOB. 2/4 DOE with minimal activity      Pertinent Vitals/Pain Pain Assessment: Faces Faces Pain Scale: Hurts a little bit Pain Location: throat Pain Descriptors / Indicators: Tender Pain Intervention(s): Monitored during session  Home Living                          Prior Function            PT Goals (current goals can now be found in the care plan section) Acute Rehab PT Goals Patient Stated Goal: to breathe better Progress towards PT goals: Progressing toward goals     Frequency    Min 3X/week      PT Plan Current plan remains appropriate    Co-evaluation              AM-PAC PT "6 Clicks" Mobility   Outcome Measure  Help needed turning from your back to your side while in a flat bed without using bedrails?: None Help needed moving from lying on your back to sitting on the side of a flat bed without using bedrails?: A Little Help needed moving to and from a bed to a chair (including a wheelchair)?: A Little Help needed standing up from a chair using your arms (e.g., wheelchair or bedside chair)?: A Little Help needed to walk in hospital room?: A Little Help needed climbing 3-5 steps with a railing? : A Little 6 Click Score: 19    End of Session Equipment Utilized During Treatment: Gait belt Activity Tolerance: Patient tolerated treatment well;Patient limited by fatigue Patient left: in chair;with call bell/phone within reach Nurse Communication: Mobility status PT Visit Diagnosis: Unsteadiness on feet (R26.81);Other abnormalities of gait and mobility (R26.89);Difficulty in walking, not elsewhere classified (R26.2)     Time: 3845-3646 PT Time Calculation (min) (ACUTE ONLY): 24 min  Charges:  $Gait Training: 8-22 mins $Therapeutic Exercise: 8-22 mins                     Lorrin Goodell, PT  Office # [phone] Pager [phone]    Lorriane Shire 10/04/2021, 9:15 AM

## 2021-10-04 NOTE — Progress Notes (Signed)
PROGRESS NOTE    Maureen Jones  DVV:[id number] DOB: 1966/10/16 DOA: 09/20/2021 PCP: Pcp, No   Brief Narrative: 55 year old with past medical history significant for hypertension and diabetes who presented [facility] on 10/10 with acute altered mental status.  She was reportedly at home and went to smoke a cigarette at 2 AM with her nephew.  She was apparently seen by her family at that time.  After smoking, the family heard a loud noise and she was found altered and confused.  On arrival to the ER she was noted to be agitated, confused.  She was seen initially as a code stroke.  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  She was hypertensive with initial pressure of 240/ 112.  CBG 437, creatinine 2.1, ammonia 30, white blood cell 14, UDS was negative.  Chest x-ray showed low lung volume.  Patient was intubated on admission for acute encephalopathy.  On 10/12 patient was extubated, due to increased work of breathing she was reintubated.  On 10/14 she was extubated, she had some stridors, received racemic epi, she was transiently on Precedex for agitation.  She has remained stable and she was transitioned to [facility] on 10/19.  He was evaluated by ENT who recommended treatment for reflux, patient has some mild edema of the glottis and supraglottic larynx post intubation.   Assessment & Plan:   Active Problems:   Encephalopathy acute   Endotracheal tube present  1-Acute Hypoxic Respiratory Failure: related to asthma, PNA.  -Initially admitted for altered mental status, airway protection. -Previously intubated 2 times during this admission, second time due to her stridor, but no significant laryngeal edema noted during reintubation.  Extubated on 10/14 with ongoing hoarseness.  ENT consulted.  Mild edema glottis and supraglottic larynx.  ENT recommended PPI and follow-up as an outpatient.  Patient is at risk for developing subglottic  stenosis. -Chest x-ray concerning for right middle lobe and lower lobe pneumonia.   -Completed  Cefepime for 7 days , vancomycin started 10/22--10/24.Marland Kitchen Sputum culture: few normal respiratory flora. Antibiotics discontinue.  -On Daily Claritin and Singulair. -She has history of asthma, she would need at discharge Dulera, Spiriva, -Chest x ray vascular congestion. She has received lasix doses.  -ECHO NL ef, Diastolic parameter indeterminate.  - 10/22: BIPAP PRN, worsening work of breathing overnight. Received dose racemic epinephrine and inhale which help per nurse.  -steroids decreased to 80 mg IV daily. ENT to re-evaluate patient.    2-Hoarseness: Persistent postextubation due to subglottic edema. ENT consulted recommended aggressive care for GERD. Protonix twice daily Needs to  follow-up with ENT as an outpatient. CCM will ask ENT to re-evaluate.   Acute hypertensive emergency: Initial blood pressure 240/112 ECHO; Moderate left ventricular hypertrophy Continue with amlodipine, Coreg,hydralazine Hold cozaar due to AKI.  Will increase hydralazine.   Acute metabolic encephalopathy due to hypertensive emergency: CT head negative MRI negative. sundown at night. Continue with Seroquel Klonopin BID. Prozac.   Dysphagia: Evaluated by speech today who recommended regular solids and thin liquids diet.  Leukocytosis : Suspect related to steroid.  On IV antibiotics as well.  AKI on CKD stage IIIb: prior Cr at 1.7 Cr: 1.9.  Peak to 2.4. Hold Cozaar. Monitor closely.   Diabetes uncontrolled hyperglycemia: Continue with Levemir 25 units twice daily.   meal coverage. Elevated, hopefully will decrease as steroids are taper down. Increase meals coverage today   Anemia of critical illness; hb stable.  Mild hyperkalemia; received  lokelma.   Nutrition Problem: Inadequate oral intake Etiology: inability to eat    Signs/Symptoms: NPO status    Interventions: Ensure Enlive (each  supplement provides 350kcal and 20 grams of protein), MVI  Estimated body mass index is 38.22 kg/m as calculated from the following:   Height as of this encounter: 5\' 6"  (1.676 m).   Weight as of this encounter: 107.4 kg.   DVT prophylaxis: Heparin Code Status: Full code Family Communication: Disposition Plan:  Status is: Inpatient  Remains inpatient appropriate because: Uncontrolled hypertension, respiratory failure, hyperglycemia        Consultants:  CM admitted patient ENT  Procedures:    Antimicrobials:    Subjective: She is alert, tachypnea. Report doing ok, keeps eyes close. Didn't want to use BIPAP last night, gives her too much pressure.   Objective: Vitals:   10/04/21 1129 10/04/21 1200 10/04/21 1400 10/04/21 1505  BP:   (!) 152/74   Pulse: 78 75 77 76  Resp: (!) 22 19 (!) 25 20  Temp:      TempSrc:      SpO2: 99% 98% 97% 98%  Weight:      Height:        Intake/Output Summary (Last 24 hours) at 10/04/2021 1529 Last data filed at 10/04/2021 0800 Gross per 24 hour  Intake 99.97 ml  Output 1450 ml  Net -1350.03 ml    Filed Weights   09/30/21 0500 10/01/21 0500 10/04/21 0238  Weight: 107.3 kg 104.6 kg 107.4 kg    Examination:  General exam: sleepy, tachypnea Respiratory system; BL ronchus Cardiovascular system: S 1, S 2 RRR Gastrointestinal system: BS present, soft, nt Central nervous system: Alert Extremities; No edema  Data Reviewed: I have personally reviewed following labs and imaging studies  CBC: Recent Labs  Lab 09/30/21 0353 10/01/21 0438 10/02/21 0234 10/02/21 0545 10/03/21 0832 10/04/21 0329  WBC 23.9* 25.4* 22.5*  --  21.5* 23.3*  NEUTROABS  --  20.7*  --   --   --   --   HGB 10.5* 10.5* 9.9* 10.9* 9.8* 9.9*  HCT 33.1* 32.9* 32.0* 32.0* 31.8* 32.2*  MCV 92.2 90.9 91.4  --  93.3 93.3  PLT 297 314 282  --  274 115    Basic Metabolic Panel: Recent Labs  Lab 09/30/21 0353 10/01/21 0438 10/02/21 0234  10/02/21 0545 10/03/21 0832 10/04/21 0329  NA 137 139 136 137 137 136  K 4.7 5.1 4.6 4.5 5.2* 5.0  CL 109 107 106  --  108 107  CO2 20* 22 24  --  22 22  GLUCOSE 217* 198* 187*  --  162* 189*  BUN 62* 68* 62*  --  59* 58*  CREATININE 2.01* 2.39* 2.36*  --  2.26* 2.36*  CALCIUM 9.6 9.9 9.6  --  9.8 9.8  MG  --  2.1  --   --   --   --     GFR: Estimated Creatinine Clearance: 33.8 mL/min (A) (by C-G formula based on SCr of 2.36 mg/dL (H)). Liver Function Tests: No results for input(s): AST, ALT, ALKPHOS, BILITOT, PROT, ALBUMIN in the last 168 hours. No results for input(s): LIPASE, AMYLASE in the last 168 hours. No results for input(s): AMMONIA in the last 168 hours. Coagulation Profile: No results for input(s): INR, PROTIME in the last 168 hours. Cardiac Enzymes: No results for input(s): CKTOTAL, CKMB, CKMBINDEX, TROPONINI in the last 168 hours. BNP (last 3 results) No results  for input(s): PROBNP in the last 8760 hours. HbA1C: No results for input(s): HGBA1C in the last 72 hours. CBG: Recent Labs  Lab 10/03/21 1500 10/03/21 2009 10/03/21 2214 10/04/21 0753 10/04/21 1121  GLUCAP 233* 310* 304* 126* 142*    Lipid Profile: No results for input(s): CHOL, HDL, LDLCALC, TRIG, CHOLHDL, LDLDIRECT in the last 72 hours. Thyroid Function Tests: No results for input(s): TSH, T4TOTAL, FREET4, T3FREE, THYROIDAB in the last 72 hours. Anemia Panel: No results for input(s): VITAMINB12, FOLATE, FERRITIN, TIBC, IRON, RETICCTPCT in the last 72 hours. Sepsis Labs: No results for input(s): PROCALCITON, LATICACIDVEN in the last 168 hours.  Recent Results (from the past 240 hour(s))  MRSA Next Gen by PCR, Nasal     Status: None   Collection Time: 09/27/21  7:50 AM   Specimen: Nasal Mucosa; Nasal Swab  Result Value Ref Range Status   MRSA by PCR Next Gen NOT DETECTED NOT DETECTED Final    Comment: (NOTE) The GeneXpert MRSA Assay (FDA approved for NASAL specimens only), is one component  of a comprehensive MRSA colonization surveillance program. It is not intended to diagnose MRSA infection nor to guide or monitor treatment for MRSA infections. Test performance is not FDA approved in patients less than 21 years old. Performed at [facility], [place] [address], [place], [place] [address]   Culture, Respiratory w Gram Stain     Status: None   Collection Time: 09/27/21  2:22 PM   Specimen: Tracheal Aspirate; Respiratory  Result Value Ref Range Status   Specimen Description TRACHEAL ASPIRATE  Final   Special Requests NONE  Final   Gram Stain   Final    ABUNDANT WBC PRESENT,BOTH PMN AND MONONUCLEAR ABUNDANT GRAM NEGATIVE RODS MODERATE GRAM POSITIVE COCCI    Culture   Final    ABUNDANT Normal respiratory flora-no Staph aureus or Pseudomonas seen Performed at [facility], [address], [place], [place] [address]    Report Status 09/29/2021 FINAL  Final  Surgical PCR screen     Status: None   Collection Time: 10/01/21  8:26 AM   Specimen: Nasal Mucosa; Nasal Swab  Result Value Ref Range Status   MRSA, PCR NEGATIVE NEGATIVE Final   Staphylococcus aureus NEGATIVE NEGATIVE Final    Comment: (NOTE) The Xpert SA Assay (FDA approved for NASAL specimens in patients 14 years of age and older), is one component of a comprehensive surveillance program. It is not intended to diagnose infection nor to guide or monitor treatment. Performed at [facility], [place] [address], [place], [place] [address]   Culture, Respiratory w Gram Stain     Status: None   Collection Time: 10/01/21  4:20 PM   Specimen: Tracheal Aspirate  Result Value Ref Range Status   Specimen Description TRACHEAL ASPIRATE  Final   Special Requests NONE  Final   Gram Stain   Final    RARE SQUAMOUS EPITHELIAL CELLS PRESENT MODERATE WBC PRESENT,BOTH PMN AND MONONUCLEAR MODERATE GRAM POSITIVE COCCI FEW GRAM POSITIVE RODS FEW GRAM NEGATIVE RODS    Culture   Final    FEW Normal respiratory  flora-no Staph aureus or Pseudomonas seen Performed at [facility], [place] [address], [place], [place] [address]    Report Status 10/03/2021 FINAL  Final          Radiology Studies: CT SOFT TISSUE NECK WO CONTRAST  Result Date: 10/04/2021 CLINICAL DATA:  Laryngeal edema. Hoarseness. Stridor. Encephalopathy. Recent intubations. EXAM: CT  NECK WITHOUT CONTRAST TECHNIQUE: Multidetector CT imaging of the neck was performed following the standard protocol without intravenous contrast. COMPARISON:  None. FINDINGS: Pharynx and larynx: No evidence of mucosal or submucosal mass lesion. Question vocal fold paresis on the right, the vocal fold approximating midline, with mild dilatation of the laryngeal ventricle and right para form sinus. No cause of that is established. I do not see evidence of tracheal stenosis. There could be a degree of tracheobronchial malacia at the distal trachea and proximal main stem bronchi. Salivary glands: Parotid and submandibular glands are normal. Thyroid: Normal. Lymph nodes: No adenopathy. Vascular: No abnormal vascular finding. Limited intracranial: Normal Visualized orbits: Limited visualization.  No abnormality seen. Mastoids and visualized paranasal sinuses: Clear Skeleton: Ordinary mid cervical spondylosis. Upper chest: Lung apices are clear. No superior mediastinal lesion seen. Detail is limited due to chest density, motion and lack of contrast. 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 No cause is identified. No evidence of tracheal stenosis secondary to previous intubation. Question a degree of tracheobronchial malacia of the distal trachea and mainstem bronchi. Electronically Signed   By: Nelson Chimes M.D.   On: 10/04/2021 14:11        Scheduled Meds:  amLODipine  10 mg Oral Daily   carvedilol  25 mg Oral BID WC   chlorhexidine  15 mL Mouth Rinse BID   Chlorhexidine  Gluconate Cloth  6 each Topical Daily   clonazePAM  1 mg Oral BID   feeding supplement  237 mL Oral BID BM   FLUoxetine  10 mg Oral Daily   heparin  5,000 Units Subcutaneous Q8H   hydrALAZINE  50 mg Oral Q8H   insulin aspart  0-15 Units Subcutaneous TID WC   insulin aspart  4 Units Subcutaneous TID WC   insulin detemir  30 Units Subcutaneous BID   ipratropium-albuterol  3 mL Nebulization Q4H   loratadine  10 mg Oral Daily   mouth rinse  15 mL Mouth Rinse q12n4p   methylPREDNISolone (SOLU-MEDROL) injection  80 mg Intravenous Daily   mometasone-formoterol  2 puff Inhalation BID   montelukast  10 mg Oral QHS   pantoprazole  40 mg Oral Daily   QUEtiapine  50 mg Oral QHS   sodium chloride  2 spray Each Nare Q2000   Continuous Infusions:     LOS: 14 days    Time spent: 35 minutes   Shonta Bourque A Alyzah Pelly, MD [facility]   If 7PM-7AM, please contact night-coverage [email or web address]  10/04/2021, 3:29 PM

## 2021-10-04 NOTE — Progress Notes (Signed)
Speech Language Pathology Treatment: Dysphagia  Patient Details Name: Maureen Jones MRN: [id number] DOB: 26-Sep-1966 Today's Date: 10/04/2021 Time: 4827-0786 SLP Time Calculation (min) (ACUTE ONLY): 22 min  Assessment / Plan / Recommendation Clinical Impression  Pt upright in chair with increased WOB this am, with baseline RR of 17-20, mid 20s with verbal communication. She reports no increased difficulty with swallow function since last treatment session, although c/o throat feeling "scratchy" with PO intake. Self-fed sips of thin liquids via cup resulted in x1 instance of brief weak, overt coughing, which appeared to be related to consecutive sips in larger than optimal volumes. Minimal verbal cues for small sips, x1 at a time, eliminated s/sx of aspiration. All solids self-fed in small bites at slow rate were unremarkable for signs of aspiration. RN reports this date observation of brief overt coughing with med intake whole with liquids. Given continued difficulty with coordination of swallowing and breathing, meds whole in puree may be more manageable at this time. Pt appears to be fairly independent with basic swallow precautions/strategies to increase safety with PO intake, although intermittent supervision is recommended to ensure implementation. Regular/thin liquid diet to remain with adherence to trained precautions/strategies. No further SLP f/u warranted at this time. Will s/o. Please re-consult if changes occur.    HPI HPI: 55 year old female admitted with AMS 10/10, intubated, stroke w/u negative. Diagnosed with acute metabolic encephalopathy in the setting of hypertensive emergency, possible PRES. Extubated but reintubated 10/12 with increased WOB, not mobilizing secretions, stridor, received steroids, NO laryngeal edema noted. Extubated 10/14 with lingering stridor, improving 10/15 am. ENT scoped pt 10/18: "Edema of the glottis and subglottic larynx following recent intubation and then  repeat intubation.  There is no evidence of cord paralysis or any tumor present.  There is currently no stridor.  Allow more time for resolution of the swelling.  Unfortunately she is at risk for developing subglottic stenosis."      SLP Plan  All goals met;Discharge SLP treatment due to (comment)      Recommendations for follow up therapy are one component of a multi-disciplinary discharge planning process, led by the attending physician.  Recommendations may be updated based on patient status, additional functional criteria and insurance authorization.    Recommendations  Diet recommendations: Regular;Thin liquid Liquids provided via: Cup;No straw Medication Administration: Whole meds with puree Supervision: Patient able to self feed Compensations: Slow rate;Small sips/bites;Minimize environmental distractions Postural Changes and/or Swallow Maneuvers: Seated upright 90 degrees                Oral Care Recommendations: Oral care BID Follow up Recommendations: None SLP Visit Diagnosis: Dysphagia, oropharyngeal phase (R13.12) Plan: All goals met;Discharge SLP treatment due to (comment)       [place], [place], [place] Office Number: [phone]   Maureen Jones  10/04/2021, 10:05 AM

## 2021-10-04 NOTE — Progress Notes (Signed)
NAMEAnaysia Jones, MRN:  [id number], DOB:  [birth year], LOS: 20 ADMISSION DATE:  09/20/2021, CONSULTATION DATE: 10/10 REFERRING MD:  Dr. Laverta Baltimore, CHIEF COMPLAINT:  AMS   History of Present Illness:  55 y/o F who presented to [facility] on 10/10 with reports of acute altered mental status.   She was reportedly at home and went to smoke a cigarette 0200 with her nephew. She was apparently seen by her family at that time.  After smoking, the family heard a loud thud and she was found altered and confused.  On arrival to the ER she was noted to be agitated, confused, localized to pain and stated "that hurts" with stimulation.  She was seen initially as a CODE STROKE.  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 She was hypertensive with initial pressure of 240/112 with MAP of 136.  Initial labs showed glucose of 437, BUN 36, Sr CR 2.17, CK 316, ammonia 30, Mg 1.9, WBC 14.4. UDS was negative.  COVID & flu screening were negative.  CXR showed low lung volumes, ETT in good position, L>R interstitial opacities.    PCCM called for ICU admission.   Pertinent  Medical History  HTN DM   Significant Hospital Events: Including procedures, antibiotic start and stop dates in addition to other pertinent events   10/10 Admit with acute encephalopathy, hypertensive 10/12 extubated, increased WOB, re-intubated 10/14 extubated , lingering stridor, Received racemic epi x2 over the last 24 hours + another dose of Decadron, Mild agitation requiring restart of Precedex 10/15 swallow evaluation >> dysphagia 3 diet 10/20 decompensated after switch to PO steroids 10/21 better after CAT and back on IV steroids  Interim History / Subjective:  Patient only worse BiPAP 10-15 minutes overnight. Reports breathing is same, no better, no worse   Objective   Blood pressure (!) 158/73, pulse 83, temperature 98.1 F (36.7 C), temperature source Oral, resp. rate (!) 23,  height 5\' 6"  (1.676 m), weight 107.4 kg, SpO2 100 %.    Vent Mode: PSV;BIPAP FiO2 (%):  [40 %] 40 % PEEP:  [5 cmH20] 5 cmH20 Pressure Support:  [5 cmH20] 5 cmH20   Intake/Output Summary (Last 24 hours) at 10/04/2021 1023 Last data filed at 10/04/2021 0800 Gross per 24 hour  Intake 339.97 ml  Output 1450 ml  Net -1110.03 ml   Filed Weights   09/30/21 0500 10/01/21 0500 10/04/21 0238  Weight: 107.3 kg 104.6 kg 107.4 kg    Examination: General: middle aged woman sitting up in chair  HENT: MMM Lungs: upper airway wheeze, hoarse. No accessory muscle use. 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 Previously intubated 2 times this admission, second time due to stridor, but had no laryngeal edema noted during reintubation.  Extubated on 10/14 with ongoing hoarseness. Now confirmed glottic and subglottic edema when ENT scoped her. Plan -wean O2 as able (currently on 6L [place])  -CPAP QHS; ativan PRN to help with her tolerating it -Racemic epi nebs PRN -duonebs Q4h> continued scheduled -Con't IV  steroids > discuss role and possible slow slow taper -Con't high dose Dulera; she will need high dose ICS and triple inhaled therapy -Cultures remains negative, completed 8 days of cefepime (will d/c as WBC is likely related to steroids) -Con't claritin and singulair.  Concern for anxiety- related to VCD complicating her asthma & glottic/ subglottic edema. -con't daily prozac; added 10/22 -con't  klonopin BID, added  10/22  Hoarseness persistent post-extubation due to subglottic edema -Appreciate ENT's management> con't aggressive GERD treatment. Dr. Constance Holster will follow her as an outpatient as well.  Plan -will ask ENT to re-evaluate given minimal improvement over last 10 days  Acute hypertensive emergency > now just hypertensive Initial BP 240/112, MAP 136  Echo moderate LVH -On amlodipine, coreg , hydralazine -management per primary  Acute Metabolic Encephalopathy due to hypertensive emergency. Imaging negative for PRES. CT head negative. MRI negative. Has had sundowning at night this admission, even nights without respiratory distress -seroquel 50mg  QHS; haldol PRN.   AKI, improving 10/2020 cr 1.7. Cr improved after gentle hydration 10/11. -per primary  DM with uncontrolled hyperglycemia -per primary  Acute anemia due to critical illness -per primary  PCCM will continue to follow.   Best Practice (right click and "Reselect all SmartList Selections" daily)  Per primary  Labs   CBC: Recent Labs  Lab 09/30/21 0353 10/01/21 0438 10/02/21 0234 10/02/21 0545 10/03/21 0832 10/04/21 0329  WBC 23.9* 25.4* 22.5*  --  21.5* 23.3*  NEUTROABS  --  20.7*  --   --   --   --   HGB 10.5* 10.5* 9.9* 10.9* 9.8* 9.9*  HCT 33.1* 32.9* 32.0* 32.0* 31.8* 32.2*  MCV 92.2 90.9 91.4  --  93.3 93.3  PLT 297 314 282  --  274 779    Basic Metabolic Panel: Recent Labs  Lab 09/30/21 0353 10/01/21 0438 10/02/21 0234 10/02/21 0545 10/03/21 0832 10/04/21 0329  NA 137 139 136 137 137 136  K 4.7 5.1 4.6 4.5 5.2* 5.0  CL 109 107 106  --  108 107  CO2 20* 22 24  --  22 22  GLUCOSE 217* 198* 187*  --  162* 189*  BUN 62* 68* 62*  --  59* 58*  CREATININE 2.01* 2.39* 2.36*  --  2.26* 2.36*  CALCIUM 9.6 9.9 9.6  --  9.8 9.8  MG  --  2.1  --   --   --   --    GFR: Estimated Creatinine Clearance: 33.8 mL/min (A) (by C-G formula based on SCr of 2.36 mg/dL (H)). Recent Labs  Lab 10/01/21 0438  10/02/21 0234 10/03/21 0832 10/04/21 0329  WBC 25.4* 22.5* 21.5* 23.3*    Liver Function Tests: No results for input(s): AST, ALT, ALKPHOS, BILITOT, PROT, ALBUMIN in the last 168 hours.  No results for input(s): LIPASE, AMYLASE in the last 168 hours. No results for input(s): AMMONIA in the last 168 hours.   ABG    Component Value Date/Time   PHART 7.357 10/02/2021 0545   PCO2ART 45.2 10/02/2021 0545   PO2ART 105 10/02/2021 0545   HCO3 25.4 10/02/2021 0545   TCO2 27 10/02/2021 0545   O2SAT 98.0 10/02/2021 0545    Hayden Pedro, AGACNP-BC [facility]  PCCM Pgr: [phone]

## 2021-10-05 ENCOUNTER — Inpatient Hospital Stay (HOSPITAL_COMMUNITY): Payer: Medicare Other

## 2021-10-05 ENCOUNTER — Inpatient Hospital Stay: Payer: Self-pay

## 2021-10-05 DIAGNOSIS — J384 Edema of larynx: Secondary | ICD-10-CM | POA: Diagnosis not present

## 2021-10-05 DIAGNOSIS — J9600 Acute respiratory failure, unspecified whether with hypoxia or hypercapnia: Secondary | ICD-10-CM | POA: Diagnosis not present

## 2021-10-05 DIAGNOSIS — J383 Other diseases of vocal cords: Secondary | ICD-10-CM

## 2021-10-05 LAB — POCT I-STAT 7, (LYTES, BLD GAS, ICA,H+H)
Acid-base deficit: 1 mmol/L (ref 0.0–2.0)
Acid-base deficit: 1 mmol/L (ref 0.0–2.0)
Bicarbonate: 26.1 mmol/L (ref 20.0–28.0)
Bicarbonate: 26.5 mmol/L (ref 20.0–28.0)
Calcium, Ion: 1.51 mmol/L (ref 1.15–1.40)
Calcium, Ion: 1.5 mmol/L — ABNORMAL HIGH (ref 1.15–1.40)
HCT: 31 % — ABNORMAL LOW (ref 36.0–46.0)
HCT: 32 % — ABNORMAL LOW (ref 36.0–46.0)
Hemoglobin: 10.5 g/dL — ABNORMAL LOW (ref 12.0–15.0)
Hemoglobin: 10.9 g/dL — ABNORMAL LOW (ref 12.0–15.0)
O2 Saturation: 96 %
O2 Saturation: 95 %
Patient temperature: 97.6
Potassium: 5.6 mmol/L — ABNORMAL HIGH (ref 3.5–5.1)
Potassium: 5.8 mmol/L — ABNORMAL HIGH (ref 3.5–5.1)
Sodium: 136 mmol/L (ref 135–145)
Sodium: 138 mmol/L (ref 135–145)
TCO2: 28 mmol/L (ref 22–32)
TCO2: 28 mmol/L (ref 22–32)
pCO2 arterial: 53.8 mmHg — ABNORMAL HIGH (ref 32.0–48.0)
pCO2 arterial: 55.3 mmHg — ABNORMAL HIGH (ref 32.0–48.0)
pH, Arterial: 7.291 — ABNORMAL LOW (ref 7.350–7.450)
pH, Arterial: 7.289 — ABNORMAL LOW (ref 7.350–7.450)
pO2, Arterial: 87 mmHg (ref 83.0–108.0)
pO2, Arterial: 86 mmHg (ref 83.0–108.0)

## 2021-10-05 LAB — BASIC METABOLIC PANEL
Anion gap: 7 (ref 5–15)
BUN: 59 mg/dL — ABNORMAL HIGH (ref 6–20)
CO2: 22 mmol/L (ref 22–32)
Calcium: 10 mg/dL (ref 8.9–10.3)
Chloride: 106 mmol/L (ref 98–111)
Creatinine, Ser: 2.24 mg/dL — ABNORMAL HIGH (ref 0.44–1.00)
GFR, Estimated: 25 mL/min — ABNORMAL LOW (ref >60)
Glucose, Bld: 303 mg/dL — ABNORMAL HIGH (ref 70–99)
Potassium: 5.8 mmol/L — ABNORMAL HIGH (ref 3.5–5.1)
Sodium: 135 mmol/L (ref 135–145)

## 2021-10-05 LAB — GLUCOSE, CAPILLARY
Glucose-Capillary: 246 mg/dL — ABNORMAL HIGH (ref 70–99)
Glucose-Capillary: 188 mg/dL — ABNORMAL HIGH (ref 70–99)
Glucose-Capillary: 211 mg/dL — ABNORMAL HIGH (ref 70–99)
Glucose-Capillary: 244 mg/dL — ABNORMAL HIGH (ref 70–99)
Glucose-Capillary: 270 mg/dL — ABNORMAL HIGH (ref 70–99)
Glucose-Capillary: 213 mg/dL — ABNORMAL HIGH (ref 70–99)
Glucose-Capillary: 149 mg/dL — ABNORMAL HIGH (ref 70–99)
Glucose-Capillary: 125 mg/dL — ABNORMAL HIGH (ref 70–99)

## 2021-10-05 LAB — CBC
HCT: 32.7 % — ABNORMAL LOW (ref 36.0–46.0)
Hemoglobin: 10 g/dL — ABNORMAL LOW (ref 12.0–15.0)
MCH: 28.7 pg (ref 26.0–34.0)
MCHC: 30.6 g/dL (ref 30.0–36.0)
MCV: 94 fL (ref 80.0–100.0)
Platelets: 243 10*3/uL (ref 150–400)
RBC: 3.48 MIL/uL — ABNORMAL LOW (ref 3.87–5.11)
RDW: 13.6 % (ref 11.5–15.5)
WBC: 19 10*3/uL — ABNORMAL HIGH (ref 4.0–10.5)
nRBC: 0 % (ref 0.0–0.2)

## 2021-10-05 LAB — POTASSIUM
Potassium: 5.9 mmol/L — ABNORMAL HIGH (ref 3.5–5.1)
Potassium: 5.7 mmol/L — ABNORMAL HIGH (ref 3.5–5.1)
Potassium: 5.2 mmol/L — ABNORMAL HIGH (ref 3.5–5.1)

## 2021-10-05 LAB — MAGNESIUM: Magnesium: 2 mg/dL (ref 1.7–2.4)

## 2021-10-05 LAB — PHOSPHORUS: Phosphorus: 3.7 mg/dL (ref 2.5–4.6)

## 2021-10-05 MED ORDER — INSULIN DETEMIR 100 UNIT/ML ~~LOC~~ SOLN
35.0000 [IU] | Freq: Two times a day (BID) | SUBCUTANEOUS | Status: DC
  Administered 2021-10-05 – 2021-10-07 (×5): 35 [IU] via SUBCUTANEOUS
  Filled 2021-10-05 (×6): qty 0.35

## 2021-10-05 MED ORDER — ROCURONIUM BROMIDE 10 MG/ML (PF) SYRINGE: 60.0000 mg | PREFILLED_SYRINGE | Freq: Once | INTRAVENOUS | Status: AC

## 2021-10-05 MED ORDER — PREDNISONE 20 MG PO TABS
40.0000 mg | ORAL_TABLET | Freq: Every day | ORAL | Status: DC
  Filled 2021-10-05: qty 2

## 2021-10-05 MED ORDER — PROSOURCE TF PO LIQD
45.0000 mL | Freq: Two times a day (BID) | ORAL | Status: DC
  Administered 2021-10-05 – 2021-10-10 (×11): 45 mL
  Filled 2021-10-05 (×14): qty 45

## 2021-10-05 MED ORDER — QUETIAPINE FUMARATE 50 MG PO TABS: 50.0000 mg | ORAL_TABLET | Freq: Every day | ORAL | Status: DC

## 2021-10-05 MED ORDER — QUETIAPINE FUMARATE 100 MG PO TABS
100.0000 mg | ORAL_TABLET | Freq: Every day | ORAL | Status: DC
  Administered 2021-10-05 – 2021-10-15 (×10): 100 mg
  Filled 2021-10-05 (×10): qty 1

## 2021-10-05 MED ORDER — ORAL CARE MOUTH RINSE
15.0000 mL | OROMUCOSAL | Status: DC
  Administered 2021-10-05 – 2021-10-08 (×23): 15 mL via OROMUCOSAL

## 2021-10-05 MED ORDER — MIDAZOLAM HCL 2 MG/2ML IJ SOLN
INTRAMUSCULAR | Status: AC
  Administered 2021-10-05 (×2): 2 mg
  Filled 2021-10-05: qty 4

## 2021-10-05 MED ORDER — ACETAMINOPHEN 325 MG PO TABS
650.0000 mg | ORAL_TABLET | ORAL | Status: DC | PRN
  Administered 2021-10-16 – 2021-10-18 (×3): 650 mg
  Filled 2021-10-05 (×3): qty 2

## 2021-10-05 MED ORDER — MIDAZOLAM HCL 2 MG/2ML IJ SOLN
2.0000 mg | Freq: Once | INTRAMUSCULAR | Status: AC
  Administered 2021-10-05: 2 mg via INTRAVENOUS

## 2021-10-05 MED ORDER — INSULIN ASPART 100 UNIT/ML IJ SOLN
0.0000 [IU] | INTRAMUSCULAR | Status: DC
  Administered 2021-10-05: 5 [IU] via SUBCUTANEOUS
  Administered 2021-10-05: 2 [IU] via SUBCUTANEOUS
  Administered 2021-10-05: 8 [IU] via SUBCUTANEOUS
  Administered 2021-10-05: 2 [IU] via SUBCUTANEOUS
  Administered 2021-10-05: 5 [IU] via SUBCUTANEOUS
  Administered 2021-10-06: 3 [IU] via SUBCUTANEOUS
  Administered 2021-10-06: 5 [IU] via SUBCUTANEOUS
  Administered 2021-10-06 (×2): 3 [IU] via SUBCUTANEOUS
  Administered 2021-10-06: 2 [IU] via SUBCUTANEOUS
  Administered 2021-10-07: 8 [IU] via SUBCUTANEOUS
  Administered 2021-10-07: 5 [IU] via SUBCUTANEOUS
  Administered 2021-10-07: 2 [IU] via SUBCUTANEOUS
  Administered 2021-10-07: 8 [IU] via SUBCUTANEOUS
  Administered 2021-10-08 (×2): 2 [IU] via SUBCUTANEOUS
  Administered 2021-10-08: 11 [IU] via SUBCUTANEOUS
  Administered 2021-10-08: 3 [IU] via SUBCUTANEOUS
  Administered 2021-10-08 – 2021-10-09 (×2): 5 [IU] via SUBCUTANEOUS
  Administered 2021-10-09: 11 [IU] via SUBCUTANEOUS
  Administered 2021-10-09: 3 [IU] via SUBCUTANEOUS
  Administered 2021-10-09: 2 [IU] via SUBCUTANEOUS
  Administered 2021-10-10 (×2): 5 [IU] via SUBCUTANEOUS
  Administered 2021-10-10: 15 [IU] via SUBCUTANEOUS
  Administered 2021-10-10: 2 [IU] via SUBCUTANEOUS
  Administered 2021-10-10: 5 [IU] via SUBCUTANEOUS
  Administered 2021-10-11: 8 [IU] via SUBCUTANEOUS
  Administered 2021-10-11: 2 [IU] via SUBCUTANEOUS

## 2021-10-05 MED ORDER — HYDRALAZINE HCL 50 MG PO TABS
75.0000 mg | ORAL_TABLET | Freq: Three times a day (TID) | ORAL | Status: DC
  Administered 2021-10-05 – 2021-10-07 (×5): 75 mg
  Filled 2021-10-05 (×6): qty 2

## 2021-10-05 MED ORDER — ETOMIDATE 2 MG/ML IV SOLN
INTRAVENOUS | Status: AC
  Filled 2021-10-05: qty 20

## 2021-10-05 MED ORDER — CLONAZEPAM 1 MG PO TABS
1.0000 mg | ORAL_TABLET | Freq: Two times a day (BID) | ORAL | Status: DC
  Administered 2021-10-05 – 2021-10-06 (×4): 1 mg
  Filled 2021-10-05 (×5): qty 1

## 2021-10-05 MED ORDER — FUROSEMIDE 10 MG/ML IJ SOLN
40.0000 mg | Freq: Once | INTRAMUSCULAR | Status: AC
  Administered 2021-10-05: 40 mg via INTRAVENOUS
  Filled 2021-10-05: qty 4

## 2021-10-05 MED ORDER — ETOMIDATE 2 MG/ML IV SOLN
20.0000 mg | Freq: Once | INTRAVENOUS | Status: AC
  Administered 2021-10-05: 20 mg via INTRAVENOUS

## 2021-10-05 MED ORDER — FLUOXETINE HCL 10 MG PO CAPS
10.0000 mg | ORAL_CAPSULE | Freq: Every day | ORAL | Status: DC
  Administered 2021-10-05 – 2021-10-12 (×8): 10 mg
  Filled 2021-10-05 (×9): qty 1

## 2021-10-05 MED ORDER — CARVEDILOL 25 MG PO TABS
25.0000 mg | ORAL_TABLET | Freq: Two times a day (BID) | ORAL | Status: DC
  Administered 2021-10-05 – 2021-10-07 (×4): 25 mg
  Filled 2021-10-05 (×4): qty 1

## 2021-10-05 MED ORDER — INSULIN ASPART 100 UNIT/ML IV SOLN
5.0000 [IU] | Freq: Once | INTRAVENOUS | Status: AC
  Administered 2021-10-05: 5 [IU] via INTRAVENOUS

## 2021-10-05 MED ORDER — ROCURONIUM BROMIDE 10 MG/ML (PF) SYRINGE
60.0000 mg | PREFILLED_SYRINGE | Freq: Once | INTRAVENOUS | Status: AC
  Administered 2021-10-05: 60 mg via INTRAVENOUS

## 2021-10-05 MED ORDER — NOREPINEPHRINE 4 MG/250ML-% IV SOLN
INTRAVENOUS | Status: AC
  Filled 2021-10-05: qty 250

## 2021-10-05 MED ORDER — ROCURONIUM BROMIDE 10 MG/ML (PF) SYRINGE
PREFILLED_SYRINGE | INTRAVENOUS | Status: AC
  Filled 2021-10-05: qty 10

## 2021-10-05 MED ORDER — FENTANYL CITRATE (PF) 100 MCG/2ML IJ SOLN: 50.0000 ug | INTRAMUSCULAR | Status: DC | PRN

## 2021-10-05 MED ORDER — PROPOFOL 1000 MG/100ML IV EMUL
INTRAVENOUS | Status: AC
  Filled 2021-10-05: qty 100

## 2021-10-05 MED ORDER — VITAL AF 1.2 CAL PO LIQD
1000.0000 mL | ORAL | Status: DC
  Administered 2021-10-05 – 2021-10-07 (×3): 1000 mL

## 2021-10-05 MED ORDER — SODIUM CHLORIDE 0.9% FLUSH
10.0000 mL | Freq: Two times a day (BID) | INTRAVENOUS | Status: DC
  Administered 2021-10-05 – 2021-10-20 (×29): 10 mL
  Administered 2021-10-21: 20 mL
  Administered 2021-10-21 – 2021-11-15 (×51): 10 mL

## 2021-10-05 MED ORDER — MELATONIN 5 MG PO TABS: 5.0000 mg | ORAL_TABLET | Freq: Every evening | ORAL | Status: DC | PRN

## 2021-10-05 MED ORDER — LORATADINE 10 MG PO TABS
10.0000 mg | ORAL_TABLET | Freq: Every day | ORAL | Status: DC
  Administered 2021-10-05 – 2021-10-22 (×17): 10 mg
  Filled 2021-10-05 (×18): qty 1

## 2021-10-05 MED ORDER — DOCUSATE SODIUM 50 MG/5ML PO LIQD
100.0000 mg | Freq: Two times a day (BID) | ORAL | Status: DC
  Administered 2021-10-05 – 2021-10-17 (×19): 100 mg
  Filled 2021-10-05 (×23): qty 10

## 2021-10-05 MED ORDER — POLYETHYLENE GLYCOL 3350 17 G PO PACK
17.0000 g | PACK | Freq: Every day | ORAL | Status: DC
  Administered 2021-10-06 – 2021-10-17 (×8): 17 g
  Filled 2021-10-05 (×10): qty 1

## 2021-10-05 MED ORDER — DEXMEDETOMIDINE HCL IN NACL 400 MCG/100ML IV SOLN
0.1000 ug/kg/h | INTRAVENOUS | Status: DC
  Administered 2021-10-05: 0.4 ug/kg/h via INTRAVENOUS
  Filled 2021-10-05: qty 100

## 2021-10-05 MED ORDER — MONTELUKAST SODIUM 10 MG PO TABS
10.0000 mg | ORAL_TABLET | Freq: Every day | ORAL | Status: DC
  Administered 2021-10-05 – 2021-10-19 (×14): 10 mg
  Filled 2021-10-05 (×14): qty 1

## 2021-10-05 MED ORDER — GUAIFENESIN 100 MG/5ML PO LIQD: 10.0000 mL | ORAL | Status: DC | PRN

## 2021-10-05 MED ORDER — AMLODIPINE BESYLATE 10 MG PO TABS
10.0000 mg | ORAL_TABLET | Freq: Every day | ORAL | Status: DC
  Administered 2021-10-05 – 2021-10-13 (×9): 10 mg
  Filled 2021-10-05 (×9): qty 1

## 2021-10-05 MED ORDER — MIDAZOLAM HCL 2 MG/2ML IJ SOLN
INTRAMUSCULAR | Status: AC
  Filled 2021-10-05: qty 2

## 2021-10-05 MED ORDER — FENTANYL CITRATE (PF) 100 MCG/2ML IJ SOLN
INTRAMUSCULAR | Status: AC
  Filled 2021-10-05: qty 2

## 2021-10-05 MED ORDER — SODIUM ZIRCONIUM CYCLOSILICATE 10 G PO PACK
10.0000 g | PACK | ORAL | Status: AC
  Administered 2021-10-05: 10 g
  Filled 2021-10-05: qty 1

## 2021-10-05 MED ORDER — METHYLPREDNISOLONE SODIUM SUCC 125 MG IJ SOLR
40.0000 mg | Freq: Once | INTRAMUSCULAR | Status: AC
  Administered 2021-10-05: 40 mg via INTRAVENOUS
  Filled 2021-10-05: qty 2

## 2021-10-05 MED ORDER — PROPOFOL 1000 MG/100ML IV EMUL
0.0000 ug/kg/min | INTRAVENOUS | Status: DC
  Administered 2021-10-05: 35 ug/kg/min via INTRAVENOUS
  Administered 2021-10-05: 50 ug/kg/min via INTRAVENOUS
  Administered 2021-10-05: 40 ug/kg/min via INTRAVENOUS
  Administered 2021-10-05: 35 ug/kg/min via INTRAVENOUS
  Administered 2021-10-05: 10 ug/kg/min via INTRAVENOUS
  Administered 2021-10-06 (×2): 35 ug/kg/min via INTRAVENOUS
  Filled 2021-10-05 (×5): qty 100

## 2021-10-05 MED ORDER — PREDNISONE 5 MG/5ML PO SOLN: 40.0000 mg | Freq: Every day | ORAL | Status: DC

## 2021-10-05 MED ORDER — SODIUM CHLORIDE 0.9% FLUSH: 10.0000 mL | INTRAVENOUS | Status: DC | PRN

## 2021-10-05 MED ORDER — CHLORHEXIDINE GLUCONATE 0.12% ORAL RINSE (MEDLINE KIT)
15.0000 mL | Freq: Two times a day (BID) | OROMUCOSAL | Status: DC
  Administered 2021-10-05 – 2021-10-08 (×6): 15 mL via OROMUCOSAL

## 2021-10-05 MED ORDER — ETOMIDATE 2 MG/ML IV SOLN
INTRAVENOUS | Status: AC
  Filled 2021-10-05: qty 10

## 2021-10-05 MED ORDER — FENTANYL CITRATE (PF) 100 MCG/2ML IJ SOLN
50.0000 ug | INTRAMUSCULAR | Status: DC | PRN
  Administered 2021-10-05 – 2021-10-06 (×8): 100 ug via INTRAVENOUS
  Filled 2021-10-05 (×3): qty 2
  Filled 2021-10-05: qty 4
  Filled 2021-10-05: qty 2
  Filled 2021-10-05: qty 4
  Filled 2021-10-05: qty 2

## 2021-10-05 MED ORDER — FENTANYL CITRATE (PF) 100 MCG/2ML IJ SOLN
100.0000 ug | Freq: Once | INTRAMUSCULAR | Status: AC
  Administered 2021-10-05: 100 ug via INTRAVENOUS

## 2021-10-05 MED ORDER — PANTOPRAZOLE 2 MG/ML SUSPENSION
40.0000 mg | Freq: Every day | ORAL | Status: DC
  Administered 2021-10-05 – 2021-10-13 (×9): 40 mg
  Filled 2021-10-05 (×9): qty 20

## 2021-10-05 MED ORDER — ROCURONIUM BROMIDE 10 MG/ML (PF) SYRINGE
PREFILLED_SYRINGE | INTRAVENOUS | Status: AC
  Administered 2021-10-05: 100 mg
  Filled 2021-10-05: qty 10

## 2021-10-05 NOTE — Progress Notes (Signed)
[place] Progress Note Patient Name: Maureen Jones DOB: Dec 22, 1965 MRN: [id number]   Date of Service  10/05/2021  HPI/Events of Note  Post extubation-asthma, sub glottic stenosis.  Camera: On BiPAP-tolerating well, Ve > 10 lit Sats 100%. HR 86.  eICU Interventions  Continue care     Intervention Category Intermediate Interventions: Other: (to do list follow through)  Elmer Sow 10/05/2021, 2:38 AM

## 2021-10-05 NOTE — Progress Notes (Addendum)
Nutrition Follow-up  DOCUMENTATION CODES:   Obesity unspecified  INTERVENTION:   Initiate tube feeding via NG tube: Vital AF 1.2 at 50 ml/h (1200 ml per day) Prosource TF 45 ml BID  Provides 1520 kcal (2030 kcal total with propofol), 112 gm protein, 973 ml free water daily  NUTRITION DIAGNOSIS:   Inadequate oral intake related to inability to eat as evidenced by NPO status.  Ongoing   GOAL:   Patient will meet greater than or equal to 90% of their needs  Progressing   MONITOR:   PO intake, I & O's, Weight trends, Supplement acceptance  REASON FOR ASSESSMENT:   Consult, Ventilator Enteral/tube feeding initiation and management  ASSESSMENT:   Pt with PMH of DM, HTN, and smoking admitted with acute encephalopathy.  Discussed patient in ICU rounds and with RN today. Patient required re-intubation this morning d/t increased WOB and stridor.  S/P bronchoscopy and trach placement this afternoon. RN placed NG tube today for initiation of nutrition support and administration of medications.  Patient is currently intubated on ventilator support MV: 8.4 L/min Temp (24hrs), Avg:97.9 F (36.6 C), Min:97.6 F (36.4 C), Max:98.1 F (36.7 C)  Propofol: 19.3 ml/hr providing 510 kcal from lipid. If unable to d/c propofol in the next few days, will need to adjust TF orders.  Labs reviewed. K 5.8 CBG: [phone]  Medications reviewed and include Colace, Novolog, Levemir, Protonix, Miralax, prednisone, propofol.  Current weight 107.4 kg (10/24) Admission weight 100 kg I/O -500 ml since admission  Diet Order:   Diet Order             Diet NPO time specified  Diet effective now                   EDUCATION NEEDS:   Not appropriate for education at this time  Skin:  Skin Assessment: Reviewed RN Assessment  Last BM:  10/24 type 2  Height:   Ht Readings from Last 1 Encounters:  10/05/21 5\' 6"  (1.676 m)    Weight:   Wt Readings from Last 1 Encounters:   10/04/21 107.4 kg    Ideal Body Weight:  59.1 kg  BMI:  Body mass index is 38.22 kg/m.  Estimated Nutritional Needs:   Kcal:  1300-1500  Protein:  >/= 115 gm  Fluid:  > 1.7 L/day    Lucas Mallow, RD, LDN, CNSC Please refer to Amion for contact information.

## 2021-10-05 NOTE — Progress Notes (Signed)
[place] Progress Note Patient Name: Benicia Bergevin DOB: 1966/10/25 MRN: [id number]   Date of Service  10/05/2021  HPI/Events of Note  Getting more restlessness, confused. Not able to keep BiPAP on. Rhonchi, got duoneb at 4 AM Type 2 failure. No stridor. HR over 80.    eICU Interventions  - start Precedex gtt-to go on BiPAP with asp precautions. Then get ABG. If worsening might need re intubation. Avoiding benzo/narcotics.      Intervention Category Major Interventions: Delirium, psychosis, severe agitation - evaluation and management  Elmer Sow 10/05/2021, 4:27 AM

## 2021-10-05 NOTE — Progress Notes (Signed)
Family updated s/p trach  Erick Colace ACNP-BC [place] Pager # [phone] OR # [phone] if no answer

## 2021-10-05 NOTE — Progress Notes (Signed)
ABG result given to RN at this time.

## 2021-10-05 NOTE — Progress Notes (Signed)
[place] Progress Note Patient Name: Maureen Jones DOB: 10/17/1966 MRN: [id number]   Date of Service  10/05/2021  HPI/Events of Note  Camera eval: Sitting at bed side, more confused. No stridor. VS stable. Sats good on nasal o2. Not wearing BiPAP.  RN asking for an ABG. Getting haldol now. Received seroquil, klonopin and ativan at around 11 PM.  eICU Interventions  - ABG ordered. Asp and sz precautions. Watch for qtc prolongation.K at 5.       Intervention Category Intermediate Interventions: Other: (encephalopathy. confusion. agitation)  Elmer Sow 10/05/2021, 3:01 AM

## 2021-10-05 NOTE — Progress Notes (Signed)
Telephone consent obtained from sister [name] for PICC.  Sister requested that Lucio Edward be called and given information.  Spoke with Mr. Arlis Porta regarding PICC.  Mr. Arlis Porta has many questions regarding patient condition and prognosis.  Informed him that as the PICC nurse I do not have any answers for him but I will have the staff caring for her call him.  Spoke with Alver Fisher, RN regarding need for follow up, Alver Fisher will call Mr. Arlis Porta with update.

## 2021-10-05 NOTE — Procedures (Signed)
Intubation Procedure Note  Chana Lindstrom  [id number]  [birth year]  Date:10/05/21  Time:9:02 AM   Provider Performing:Pete E Kary Kos    Procedure: Intubation (31500)  Indication(s) Respiratory Failure  Consent Risks of the procedure as well as the alternatives and risks of each were explained to the patient and/or caregiver.  Consent for the procedure was obtained and is signed in the bedside chart   Anesthesia Etomidate, Versed, Fentanyl, and Rocuronium   Time Out Verified patient identification, verified procedure, site/side was marked, verified correct patient position, special equipment/implants available, medications/allergies/relevant history reviewed, required imaging and test results available.   Sterile Technique Usual hand hygeine, masks, and gloves were used   Procedure Description Patient positioned in bed supine.  Sedation given as noted above.  Patient was intubated with endotracheal tube using Glidescope.  View was Grade 2 only posterior commissure .  Number of attempts was 1.  Colorimetric CO2 detector was consistent with tracheal placement.   Complications/Tolerance None; patient tolerated the procedure well. Chest X-ray is ordered to verify placement.   EBL Minimal   Specimen(s) None  Erick Colace ACNP-BC [place] Pager # [phone] OR # [phone] if no answer

## 2021-10-05 NOTE — Progress Notes (Signed)
[place] Progress Note Patient Name: Maureen Jones DOB: 1966/08/15 MRN: [id number]   Date of Service  10/05/2021  HPI/Events of Note  K 5.8 rising, Cr 2.3 CxR film seen. Low lung volumes, basal atelectasis. Discussed with RN. Precedex working. Trying to strap on BiPAP.  eICU Interventions  - CBG elevated, AG normal. - changed SSI to q4 hrly. Hyperkalemia focused protocol ordered. Follow K level.      Intervention Category Intermediate Interventions: Electrolyte abnormality - evaluation and management;Diagnostic test evaluation  Elmer Sow 10/05/2021, 4:54 AM

## 2021-10-05 NOTE — Procedures (Addendum)
Diagnostic Bronchoscopy  Maureen Jones  [id number]  1966/09/13  Date:10/05/21  Time:2:34 PM   Provider Performing: Angelyn Punt under supervision of Candee Furbish   Procedure: Diagnostic Bronchoscopy [phone])  Indication(s) Assist with direct visualization of tracheostomy placement  Consent Risks of the procedure as well as the alternatives and risks of each were explained to the patient and/or caregiver.  Consent for the procedure was obtained.   Anesthesia See separate tracheostomy note   Time Out Verified patient identification, verified procedure, site/side was marked, verified correct patient position, special equipment/implants available, medications/allergies/relevant history reviewed, required imaging and test results available.   Sterile Technique Usual hand hygiene, masks, gowns, and gloves were used   Procedure Description Bronchoscope advanced through endotracheal tube and into airway.  After suctioning out tracheal secretions, bronchoscope used to provide direct visualization of tracheostomy placement.   Complications/Tolerance None; patient tolerated the procedure well.   EBL None  Specimen(s) None

## 2021-10-05 NOTE — Progress Notes (Signed)
Patient required Intubation for increase work of breathing and stridor. CCM will take over care. They will treat Hyperkalemia. Please call TR[facility] when needed. Patient not seen. No charge.

## 2021-10-05 NOTE — Progress Notes (Addendum)
NAMEAmelda Hapke, MRN:  [id number], DOB:  [birth year], LOS: 74 ADMISSION DATE:  09/20/2021, CONSULTATION DATE: 10/10 REFERRING MD:  Dr. Laverta Baltimore, CHIEF COMPLAINT:  AMS   History of Present Illness:  55 y/o F who presented to [facility] on 10/10 with reports of acute altered mental status.   She was reportedly at home and went to smoke a cigarette 0200 with her nephew. She was apparently seen by her family at that time.  After smoking, the family heard a loud thud and she was found altered and confused.  On arrival to the ER she was noted to be agitated, confused, localized to pain and stated "that hurts" with stimulation.  She was seen initially as a CODE STROKE.  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 She was hypertensive with initial pressure of 240/112 with MAP of 136.  Initial labs showed glucose of 437, BUN 36, Sr CR 2.17, CK 316, ammonia 30, Mg 1.9, WBC 14.4. UDS was negative.  COVID & flu screening were negative.  CXR showed low lung volumes, ETT in good position, L>R interstitial opacities.    PCCM called for ICU admission.   Pertinent  Medical History  HTN DM   Significant Hospital Events: Including procedures, antibiotic start and stop dates in addition to other pertinent events   10/10 Admit with acute encephalopathy, hypertensive 10/12 extubated, increased WOB, re-intubated 10/14 extubated , lingering stridor, Received racemic epi x2 over the last 24 hours + another dose of Decadron, Mild agitation requiring restart of Precedex 10/15 swallow evaluation >> dysphagia 3 diet 10/20 decompensated after switch to PO steroids 10/21 better after CAT and back on IV steroids 10/22 agitated back on  BIPAP 10/25 stridor again. Marked work of breathing. Intubated, airway noises, stridor and wheezing completely resolved post intubation. Spoke to ENT who  will follow after trach   Interim History / Subjective:  Worse again  overnight   Objective   Blood pressure 129/71, pulse 72, temperature 97.9 F (36.6 C), temperature source Oral, resp. rate (Abnormal) 27, height 5\' 6"  (1.676 m), weight 107.4 kg, SpO2 98 %.    Vent Mode: BIPAP;PCV FiO2 (%):  [40 %] 40 % Set Rate:  [20 bmp] 20 bmp PEEP:  [5 cmH20] 5 cmH20 Pressure Support:  [10 cmH20] 10 cmH20   Intake/Output Summary (Last 24 hours) at 10/05/2021 0732 Last data filed at 10/05/2021 0600 Gross per 24 hour  Intake no documentation  Output 750 ml  Net -750 ml   Filed Weights   09/30/21 0500 10/01/21 0500 10/04/21 0238  Weight: 107.3 kg 104.6 kg 107.4 kg    Examination:  General 85 yof currently on BIPAP w/ marked distress HENT + inspiratory stridor. Marked expiratory wheezing too. Coarse diffuse rhonchi in UAW Pulm diffuse rhonchi w/ marked accessory use  Card RRR Abd soft Ext warm and dry  Neuro awakes to voice. Will follow simple commands but agitated at times  Resolved Hospital Problem list   RML/RLL PNA (treated) Hypertensive emergency  Assessment & Plan:   Acute respiratory failure with hypoxia 2/2 mixed picture of VCD/post intubation subglottic and glottic edema/vocal hoarseness  +/- asthmatic exacerbation.  -seen by ENT again 10/25 doing a little better. No new recs -Still on and off BIPAP; with delirium also being a large contributing factor Plan Proceed w/ intubation Will d/w ENT needs trach Will dec steroid dosing (Initiate taper today) Cont ICS and BDs Cont claritin and singulair  hypertensive Initial BP 240/112, MAP 136  Echo moderate LVH Plan Cont amlodipine, coreg and hydralazine   Acute Metabolic Encephalopathy due to hypertensive emergency. C/b anxiety Imaging negative for PRES. CT head negative. MRI negative. Has had sundowning at night this admission, even nights without respiratory distress; I wonder if the steroids are also contributing at this point Plan Cont Seroquel; think we need to inc the dosing to  100 Cont prozac (added 10/22) Has BID klonopin (added 10/22) Now on precedex   AKI, improving 10/2020 cr 1.7. Cr improved after gentle hydration 10/11. -slowly improving Plan Avoid nephrotoxins Daily chem Strict I&O  Hyperkalemia Plan Lokelma via tube Lasix  DM with uncontrolled hyperglycemia -glycemic control sub-optimal Plan Cont ssi Will inc levemir to 35 change for goal 140-180  Acute anemia due to critical illness No evidence of blood loss. H&H stable Plan Trend and transfuse as indicated  Best Practice (right click and "Reselect all SmartList Selections" daily)   Diet/type: tubefeeds DVT prophylaxis: prophylactic heparin  GI prophylaxis: PPI Lines: N/A Foley:  N/A Code Status:  full code Last date of multidisciplinary goals of care discussion [spoke to family ]  My cct 33 min   Maureen Jones ACNP-BC [place] Pager # [phone] OR # [phone] if no answer

## 2021-10-05 NOTE — Progress Notes (Signed)
Peripherally Inserted Central Catheter Placement  The IV Nurse has discussed with the patient and/or persons authorized to consent for the patient, the purpose of this procedure and the potential benefits and risks involved with this procedure.  The benefits include less needle sticks, lab draws from the catheter, and the patient may be discharged home with the catheter. Risks include, but not limited to, infection, bleeding, blood clot (thrombus formation), and puncture of an artery; nerve damage and irregular heartbeat and possibility to perform a PICC exchange if needed/ordered by physician.  Alternatives to this procedure were also discussed.  Bard Power PICC patient education guide, fact sheet on infection prevention and patient information card has been provided to patient /or left at bedside.  Telephone consent from father Lucio Edward.  PICC Placement Documentation  PICC Double Lumen 08/65/78 PICC Right Basilic 34 cm 0 cm (Active)  Indication for Insertion or Continuance of Line Prolonged intravenous therapies 10/05/21 2116  Exposed Catheter (cm) 0 cm 10/05/21 2116  Site Assessment Clean;Dry;Intact 10/05/21 2116  Lumen #1 Status Flushed;Saline locked;Blood return noted 10/05/21 2116  Lumen #2 Status Flushed;Saline locked;Blood return noted 10/05/21 2116  Dressing Type Transparent 10/05/21 2116  Dressing Status Clean;Dry;Intact 10/05/21 2116  Antimicrobial disc in place? Yes 10/05/21 2116  Safety Lock Not Applicable 46/96/29 5284  Line Care Connections checked and tightened 10/05/21 2116  Line Adjustment (NICU/IV Team Only) No 10/05/21 2116  Dressing Intervention New dressing 10/05/21 2116  Dressing Change Due 10/12/21 10/05/21 2116       Valeska Haislip, Nicolette Bang 10/05/2021, 9:17 PM

## 2021-10-05 NOTE — Procedures (Addendum)
Percutaneous Tracheostomy Procedure Note   Maureen Jones  [id number]  [birth year]  Date:10/05/21  Time:4:29 PM   Provider Performing:Kanyon Seibold, Marni Griffon, Erskine Emery  Procedure: Percutaneous Tracheostomy with Bronchoscopic Guidance (31600)  Indication(s) Stridor, recurrent resp failure  Consent Risks of the procedure as well as the alternatives and risks of each were explained to the patient and/or caregiver.  Consent for the procedure was obtained.  Anesthesia Etomidate, Versed, Fentanyl, Vecuronium   Time Out Verified patient identification, verified procedure, site/side was marked, verified correct patient position, special equipment/implants available, medications/allergies/relevant history reviewed, required imaging and test results available.   Sterile Technique Maximal sterile technique including sterile barrier drape, hand hygiene, sterile gown, sterile gloves, mask, hair covering.    Procedure Description Appropriate anatomy identified by palpation.  Patient's neck prepped and draped in sterile fashion.  1% lidocaine with epinephrine was used to anesthetize skin overlying neck.  1.5cm incision made and blunt dissection performed until tracheal rings could be easily palpated.   Then a size #6 Shiley tracheostomy was placed under bronchoscopic visualization using usual Seldinger technique and serial dilation.   Bronchoscope confirmed placement above the carina.  Tracheostomy was sutured in place with adhesive pad to protect skin under pressure.    Patient connected to ventilator.   Complications/Tolerance None; patient tolerated the procedure well. Chest X-ray is ordered to confirm no post-procedural complication.   EBL Minimal   Specimen(s) None    Marshell Garfinkel MD [facility] See Amion for pager  If no response to pager , please call [phone] until 7pm After 7:00 pm call Elink  [phone] 10/05/2021, 4:35 PM

## 2021-10-06 ENCOUNTER — Inpatient Hospital Stay (HOSPITAL_COMMUNITY): Payer: Medicare Other

## 2021-10-06 DIAGNOSIS — J9600 Acute respiratory failure, unspecified whether with hypoxia or hypercapnia: Secondary | ICD-10-CM | POA: Diagnosis not present

## 2021-10-06 LAB — BASIC METABOLIC PANEL
Anion gap: 7 (ref 5–15)
BUN: 56 mg/dL — ABNORMAL HIGH (ref 6–20)
CO2: 25 mmol/L (ref 22–32)
Calcium: 9.8 mg/dL (ref 8.9–10.3)
Chloride: 106 mmol/L (ref 98–111)
Creatinine, Ser: 2.11 mg/dL — ABNORMAL HIGH (ref 0.44–1.00)
GFR, Estimated: 27 mL/min — ABNORMAL LOW (ref >60)
Glucose, Bld: 101 mg/dL — ABNORMAL HIGH (ref 70–99)
Potassium: 4.3 mmol/L (ref 3.5–5.1)
Sodium: 138 mmol/L (ref 135–145)

## 2021-10-06 LAB — CBC
HCT: 29.4 % — ABNORMAL LOW (ref 36.0–46.0)
Hemoglobin: 9.6 g/dL — ABNORMAL LOW (ref 12.0–15.0)
MCH: 30.2 pg (ref 26.0–34.0)
MCHC: 32.7 g/dL (ref 30.0–36.0)
MCV: 92.5 fL (ref 80.0–100.0)
Platelets: 221 10*3/uL (ref 150–400)
RBC: 3.18 MIL/uL — ABNORMAL LOW (ref 3.87–5.11)
RDW: 13.7 % (ref 11.5–15.5)
WBC: 17.6 10*3/uL — ABNORMAL HIGH (ref 4.0–10.5)
nRBC: 0 % (ref 0.0–0.2)

## 2021-10-06 LAB — GLUCOSE, CAPILLARY
Glucose-Capillary: 100 mg/dL — ABNORMAL HIGH (ref 70–99)
Glucose-Capillary: 138 mg/dL — ABNORMAL HIGH (ref 70–99)
Glucose-Capillary: 221 mg/dL — ABNORMAL HIGH (ref 70–99)
Glucose-Capillary: 168 mg/dL — ABNORMAL HIGH (ref 70–99)
Glucose-Capillary: 152 mg/dL — ABNORMAL HIGH (ref 70–99)
Glucose-Capillary: 173 mg/dL — ABNORMAL HIGH (ref 70–99)

## 2021-10-06 LAB — TRIGLYCERIDES: Triglycerides: 967 mg/dL — ABNORMAL HIGH (ref <150)

## 2021-10-06 LAB — POTASSIUM: Potassium: 4.3 mmol/L (ref 3.5–5.1)

## 2021-10-06 MED ORDER — FENTANYL CITRATE (PF) 100 MCG/2ML IJ SOLN
25.0000 ug | INTRAMUSCULAR | Status: DC | PRN
Start: 2021-10-06 — End: 2021-10-07
  Administered 2021-10-06: 25 ug via INTRAVENOUS
  Filled 2021-10-06: qty 2

## 2021-10-06 MED ORDER — OXYCODONE-ACETAMINOPHEN 5-325 MG PO TABS
1.0000 | ORAL_TABLET | ORAL | Status: DC | PRN
  Administered 2021-10-06 – 2021-10-13 (×6): 1
  Filled 2021-10-06 (×6): qty 1

## 2021-10-06 MED ORDER — PREDNISONE 20 MG PO TABS
30.0000 mg | ORAL_TABLET | Freq: Every day | ORAL | Status: AC
  Administered 2021-10-06 – 2021-10-08 (×3): 30 mg
  Filled 2021-10-06 (×2): qty 1

## 2021-10-06 MED ORDER — PREDNISONE 20 MG PO TABS
20.0000 mg | ORAL_TABLET | Freq: Every day | ORAL | Status: AC
  Administered 2021-10-09 – 2021-10-11 (×3): 20 mg
  Filled 2021-10-06 (×3): qty 1

## 2021-10-06 MED ORDER — PREDNISONE 20 MG PO TABS: 30.0000 mg | ORAL_TABLET | Freq: Every day | ORAL | Status: DC

## 2021-10-06 MED ORDER — PREDNISONE 10 MG PO TABS
10.0000 mg | ORAL_TABLET | Freq: Every day | ORAL | Status: AC
  Administered 2021-10-12 – 2021-10-14 (×3): 10 mg
  Filled 2021-10-06 (×3): qty 1

## 2021-10-06 NOTE — Progress Notes (Signed)
OT Cancellation Note  Patient Details Name: Maureen Jones MRN: [id number] DOB: 11/24/1966   Cancelled Treatment:    Reason Eval/Treat Not Completed: Medical issues which prohibited therapy. Pt required re-intubation and new trach yesterday, 10/25. Pt currently sedated on vent. Plan to wean propofol today and attempt trach collar. OT to follow up tomorrow as medically appropriate and able.   Jolaine Artist, OT Acute Rehabilitation Services Pager [phone] Office [phone]   Delight Stare 10/06/2021, 9:38 AM

## 2021-10-06 NOTE — Procedures (Signed)
Cortrak  Person Inserting Tube:  Latacha Texeira E, RD Tube Type:  Cortrak - 43 inches Tube Size:  10 Tube Location:  Left nare Initial Placement:  Stomach Secured by: Bridle Technique Used to Measure Tube Placement:  Marking at nare/corner of mouth Cortrak Secured At:  65 cm   Cortrak Tube Team Note:  Consult received to place a Cortrak feeding tube.   X-ray is required, abdominal x-ray has been ordered by the Cortrak team. Please confirm tube placement before using the Cortrak tube.   If the tube becomes dislodged please keep the tube and contact the Cortrak team at [email or web address] ([email or web address]) for replacement.  If after hours and replacement cannot be delayed, place a NG tube and confirm placement with an abdominal x-ray.    Lucien Budney, MS, RD, LDN (she/her/hers) RD pager number and weekend/on-call pager number located in Amion.    

## 2021-10-06 NOTE — Progress Notes (Signed)
Inpatient Diabetes Program Recommendations  AACE/ADA: New Consensus Statement on Inpatient Glycemic Control (2015)  Target Ranges:  Prepandial:   less than 140 mg/dL      Peak postprandial:   less than 180 mg/dL (1-2 hours)      Critically ill patients:  140 - 180 mg/dL   Lab Results  Component Value Date   GLUCAP 138 (H) 10/06/2021   HGBA1C 12.7 (H) 09/20/2021    Review of Glycemic Control Results for Maureen Jones, Maureen Jones (MRN [id number]) as of 10/06/2021 10:15  Ref. Range 10/05/2021 19:13 10/05/2021 23:14 10/06/2021 03:06 10/06/2021 08:17  Glucose-Capillary Latest Ref Range: 70 - 99 mg/dL 149 (H) 125 (H) 100 (H) 138 (H)   Diabetes history: DM 2 Outpatient Diabetes medications:  Tresiba 120 units daily Novolog 18 units bid Current orders for Inpatient glycemic control:  Novolog moderate q 4 hours Levemir 35 units bid Prednisone 30 mg daily Inpatient Diabetes Program Recommendations:   With the taper of steroids, consider reducing Levemir 30 units BID to reduce the risk for hypoglycemia.   Thanks, Bronson Curb, MSN, RNC-OB Diabetes Coordinator [phone] (8a-5p)

## 2021-10-06 NOTE — Progress Notes (Signed)
NAMELux Jones, MRN:  [id number], DOB:  August 20, 1966, LOS: 46 ADMISSION DATE:  09/20/2021, CONSULTATION DATE: 10/10 REFERRING MD:  Dr. Laverta Baltimore, CHIEF COMPLAINT:  AMS   History of Present Illness:  55 y/o F who presented to [facility] on 10/10 with reports of acute altered mental status.   She was reportedly at home and went to smoke a cigarette 0200 with her nephew. She was apparently seen by her family at that time.  After smoking, the family heard a loud thud and she was found altered and confused.  On arrival to the ER she was noted to be agitated, confused, localized to pain and stated "that hurts" with stimulation.  She was seen initially as a CODE STROKE.  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 She was hypertensive with initial pressure of 240/112 with MAP of 136.  Initial labs showed glucose of 437, BUN 36, Sr CR 2.17, CK 316, ammonia 30, Mg 1.9, WBC 14.4. UDS was negative.  COVID & flu screening were negative.  CXR showed low lung volumes, ETT in good position, L>R interstitial opacities.    PCCM called for ICU admission.   Pertinent  Medical History  HTN DM   Significant Hospital Events: Including procedures, antibiotic start and stop dates in addition to other pertinent events   10/10 Admit with acute encephalopathy, hypertensive 10/12 extubated, increased WOB, re-intubated 10/14 extubated , lingering stridor, Received racemic epi x2 over the last 24 hours + another dose of Decadron, Mild agitation requiring restart of Precedex 10/15 swallow evaluation >> dysphagia 3 diet 10/20 decompensated after switch to PO steroids 10/21 better after CAT and back on IV steroids 10/22 agitated back on  BIPAP 10/25 stridor again. Marked work of breathing. Intubated, airway noises, stridor and wheezing completely resolved post intubation. Spoke to ENT who  will follow after trach. Trach placed by Dr Vaughan Browner. Seroquel increased to 100  qhs, changes solumedrol to prednisone to begin taper, rested on propofol over night. Tubefeeds started. RUE PICC placed  10/26 prop weaning off attempting ATC   Interim History / Subjective:  Quiet evening   Objective   Blood pressure 135/78, pulse 78, temperature 98.7 F (37.1 C), temperature source Axillary, resp. rate 18, height 5\' 6"  (1.676 m), weight 103.2 kg, SpO2 99 %.    Vent Mode: PRVC FiO2 (%):  [40 %-50 %] 40 % Set Rate:  [15 bmp-18 bmp] 18 bmp Vt Set:  [470 mL] 470 mL PEEP:  [5 cmH20] 5 cmH20 Plateau Pressure:  [19 cmH20-32 cmH20] 19 cmH20   Intake/Output Summary (Last 24 hours) at 10/06/2021 0806 Last data filed at 10/06/2021 0600 Gross per 24 hour  Intake 927.58 ml  Output 2200 ml  Net -1272.42 ml   Filed Weights   10/01/21 0500 10/04/21 0238 10/06/21 0244  Weight: 104.6 kg 107.4 kg 103.2 kg    Examination: General this is a 55 year old female who is currently sedated on vent. She is in no distress HENT 6 cuffed shiley w/ minimal bloody secretions. Otherwise NCAT  Pulm Clear. Minimal vent settings.  Card rrr Abd soft not tender  Ext trace LE edema pulse strong, brisk CR Neuro sedated.   Resolved Hospital Problem list   RML/RLL PNA (treated) Hypertensive emergency  Assessment & Plan:   Acute respiratory failure with hypoxia 2/2 mixed picture of VCD/post intubation subglottic and glottic edema/vocal hoarseness  +/- asthmatic exacerbation.  -now has trach. Wheezing COMPLETELY  resolved after UAW bypassed post intubation 10/25 Plan Wean sedation RASS 0 Working towards ATC; depending on how she looks will decide on nocturnal ventilation vs ATC 24/7; will change vent orders to PRN Taper steroids over next 7-10 d  Cont BDs ans ICS Cont clairitin and singulair  Will have SLP see 10/27 if doing OK today   hypertensive Initial BP 240/112, MAP 136  Echo moderate LVH Plan Cont amlodipine, coreg and hydralazine    Acute Metabolic Encephalopathy due to  hypertensive emergency. C/b anxiety Imaging negative for PRES. CT head negative. MRI negative. Has had sundowning at night this admission, even nights without respiratory distress; I wonder if the steroids are also contributing at this point Plan Cont Seroquel at 100 qhs (increased 10/25) Cont prozac (added 10/22) Taper steroids BID Clonazepam added 10/22 Dc propofol Will see if needs to go back on precedex   AKI, improving 10/2020 cr 1.7. Cr improved after gentle hydration 10/11. -slowly improving Plan Avoid nephrotoxins & renal adjust meds Avoid hypotension  Am chem  Strict I&O  Intermittent Fluid and Electrolyte imbalance Plan Monitor  Replace/recheck as indicated  DM with uncontrolled hyperglycemia -inc'd levemir to 35 bid 10/25 Plan Cont levemir at 35 bid Cont ssi (goal 140-180)   Acute anemia due to critical illness Plan Intermittent CBC Trigger for xfusion < 7   Best Practice (right click and "Reselect all SmartList Selections" daily)   Diet/type: tubefeeds DVT prophylaxis: prophylactic heparin  GI prophylaxis: PPI Lines: Central line and yes and it is still needed Foley:  N/A Code Status:  full code Last date of multidisciplinary goals of care discussion [spoke to family on 10/25)   My cct 72 min   Erick Colace ACNP-BC [place] Pager # [phone] OR # [phone] if no answer

## 2021-10-06 NOTE — Progress Notes (Signed)
PT Cancellation Note  Patient Details Name: Maureen Jones MRN: [id number] DOB: Apr 16, 1966   Cancelled Treatment:    Reason Eval/Treat Not Completed: Medical issues which prohibited therapy. Pt required re-intubation and new trach yesterday, 10/25. Pt currently sedated on vent. Plan to wean propofol today and attempt trach collar. PT to follow up tomorrow.   Lorriane Shire 10/06/2021, 8:36 AM  Lorrin Goodell, PT  Office # [phone] Pager [phone]

## 2021-10-07 ENCOUNTER — Inpatient Hospital Stay (HOSPITAL_COMMUNITY): Payer: Medicare Other

## 2021-10-07 DIAGNOSIS — J9600 Acute respiratory failure, unspecified whether with hypoxia or hypercapnia: Secondary | ICD-10-CM | POA: Diagnosis not present

## 2021-10-07 LAB — GLUCOSE, CAPILLARY
Glucose-Capillary: 112 mg/dL — ABNORMAL HIGH (ref 70–99)
Glucose-Capillary: 150 mg/dL — ABNORMAL HIGH (ref 70–99)
Glucose-Capillary: 164 mg/dL — ABNORMAL HIGH (ref 70–99)
Glucose-Capillary: 204 mg/dL — ABNORMAL HIGH (ref 70–99)
Glucose-Capillary: 255 mg/dL — ABNORMAL HIGH (ref 70–99)
Glucose-Capillary: 263 mg/dL — ABNORMAL HIGH (ref 70–99)

## 2021-10-07 LAB — CBC
HCT: 32 % — ABNORMAL LOW (ref 36.0–46.0)
Hemoglobin: 9.9 g/dL — ABNORMAL LOW (ref 12.0–15.0)
MCH: 29 pg (ref 26.0–34.0)
MCHC: 30.9 g/dL (ref 30.0–36.0)
MCV: 93.8 fL (ref 80.0–100.0)
Platelets: 227 10*3/uL (ref 150–400)
RBC: 3.41 MIL/uL — ABNORMAL LOW (ref 3.87–5.11)
RDW: 14.2 % (ref 11.5–15.5)
WBC: 20 10*3/uL — ABNORMAL HIGH (ref 4.0–10.5)
nRBC: 0 % (ref 0.0–0.2)

## 2021-10-07 LAB — BASIC METABOLIC PANEL
Anion gap: 9 (ref 5–15)
BUN: 54 mg/dL — ABNORMAL HIGH (ref 6–20)
CO2: 25 mmol/L (ref 22–32)
Calcium: 9.8 mg/dL (ref 8.9–10.3)
Chloride: 104 mmol/L (ref 98–111)
Creatinine, Ser: 2.11 mg/dL — ABNORMAL HIGH (ref 0.44–1.00)
GFR, Estimated: 27 mL/min — ABNORMAL LOW (ref >60)
Glucose, Bld: 233 mg/dL — ABNORMAL HIGH (ref 70–99)
Potassium: 4.4 mmol/L (ref 3.5–5.1)
Sodium: 138 mmol/L (ref 135–145)

## 2021-10-07 LAB — MAGNESIUM: Magnesium: 1.9 mg/dL (ref 1.7–2.4)

## 2021-10-07 LAB — PHOSPHORUS: Phosphorus: 3.3 mg/dL (ref 2.5–4.6)

## 2021-10-07 MED ORDER — PHENOL 1.4 % MT LIQD
1.0000 | OROMUCOSAL | Status: DC | PRN
  Administered 2021-10-07: 1 via OROMUCOSAL
  Filled 2021-10-07 (×2): qty 177

## 2021-10-07 MED ORDER — CARVEDILOL 25 MG PO TABS
50.0000 mg | ORAL_TABLET | Freq: Two times a day (BID) | ORAL | Status: DC
  Administered 2021-10-07 – 2021-10-13 (×12): 50 mg
  Filled 2021-10-07 (×12): qty 2

## 2021-10-07 MED ORDER — CLONAZEPAM 0.5 MG PO TABS
0.5000 mg | ORAL_TABLET | Freq: Two times a day (BID) | ORAL | Status: DC | PRN
  Administered 2021-10-07 – 2021-10-12 (×5): 0.5 mg
  Filled 2021-10-07 (×5): qty 1

## 2021-10-07 MED ORDER — CARVEDILOL 25 MG PO TABS
25.0000 mg | ORAL_TABLET | Freq: Once | ORAL | Status: AC
  Administered 2021-10-07: 25 mg
  Filled 2021-10-07: qty 1

## 2021-10-07 MED ORDER — INSULIN DETEMIR 100 UNIT/ML ~~LOC~~ SOLN
30.0000 [IU] | Freq: Two times a day (BID) | SUBCUTANEOUS | Status: DC
  Administered 2021-10-07: 30 [IU] via SUBCUTANEOUS
  Filled 2021-10-07 (×3): qty 0.3

## 2021-10-07 MED ORDER — HYDRALAZINE HCL 50 MG PO TABS
100.0000 mg | ORAL_TABLET | Freq: Three times a day (TID) | ORAL | Status: DC
  Administered 2021-10-07 – 2021-10-12 (×17): 100 mg
  Filled 2021-10-07 (×18): qty 2

## 2021-10-07 NOTE — Progress Notes (Signed)
Patient placed on 35% ATC.  Tolerating well.  Will continue to monitor.

## 2021-10-07 NOTE — Progress Notes (Signed)
Nutrition Follow-up  DOCUMENTATION CODES:   Obesity unspecified  INTERVENTION:   Continue tube feeding via Cortrak tube: Vital AF 1.2 at 50 ml/h (1200 ml per day) Prosource TF 45 ml BID  Provides 1520 kcal, 112 gm protein, 973 ml free water daily  NUTRITION DIAGNOSIS:   Inadequate oral intake related to inability to eat as evidenced by NPO status.  Ongoing   GOAL:   Patient will meet greater than or equal to 90% of their needs  Met with TF   MONITOR:   PO intake, I & O's, Weight trends, Supplement acceptance  REASON FOR ASSESSMENT:   Consult, Ventilator Enteral/tube feeding initiation and management  ASSESSMENT:   Pt with PMH of DM, HTN, and smoking admitted with acute encephalopathy.  Discussed patient in ICU rounds and with RN today. Cortrak tube placed yesterday for enteral nutrition, tip is gastric. Currently receiving Vital AF 1.2 at 50 ml/h with Prosource TF 45 ml BID. Tolerating well. Patient has been tolerating trach collar since early this morning, but switched back to vent support this afternoon. SLP unable to complete MBS.  Patient is currently intubated on ventilator support MV: 12.6 L/min Temp (24hrs), Avg:99.4 F (37.4 C), Min:99.1 F (37.3 C), Max:99.7 F (37.6 C)  Propofol off.  Labs reviewed.  CBG: 112-150-204  Medications reviewed and include Colace, Novolog, Levemir, Protonix, Miralax, prednisone.  Current weight 103.2 kg (10/26) Admission weight 100 kg I/O -1 L since admission  Diet Order:   Diet Order             Diet NPO time specified  Diet effective now                   EDUCATION NEEDS:   Not appropriate for education at this time  Skin:  Skin Assessment: Reviewed RN Assessment  Last BM:  10/24 type 2  Height:   Ht Readings from Last 1 Encounters:  10/05/21 $RemoveB'5\' 6"'bNHEVypA$  (1.676 m)    Weight:   Wt Readings from Last 1 Encounters:  10/06/21 103.2 kg    Ideal Body Weight:  59.1 kg  BMI:  Body mass index is  36.72 kg/m.  Estimated Nutritional Needs:   Kcal:  1300-1500  Protein:  >/= 115 gm  Fluid:  > 1.7 L/day    Lucas Mallow, RD, LDN, CNSC Please refer to Amion for contact information.

## 2021-10-07 NOTE — Progress Notes (Signed)
Modified Barium Swallow Progress Note  Patient Details  Name: Maureen Jones MRN: [id number] Date of Birth: 1966/04/04  Today's Date: 10/07/2021  Modified Barium Swallow completed.  Full report located under Chart Review in the Imaging Section.  Brief recommendations include the following:  Clinical Impression  Pt was seen in radiology suite for MBS. Pt was lethargic and the impact of this on her performance is considered. Pt's accompanying RN reported that the pt "has done a lot today". Pt tolerated PMSV intermittently during the study with stable vitals, but respiratory effort was increased by the end of the study. Pt presents with oropharyngeal dysphagia characterized by reduced bolus cohesion, reduced hyolaryngeal elevation, reduced anterior laryngeal movement, and a pharyngeal delay. Epiglottic inversion was incomplete and this appeared to be partly impacted by the Cortrak during intake of thin liquids. Trace vallecular residue was noted with solids and was cleared with a liquid wash. Penetration (PAS 3,5) was noted with thin liquids via cup and straw. Penetration was improved to PAS 2,3 with a chin tuck posture and eliminated with an effortful swallow. Prompted coughing was ineffective in expelling penetrated material. No definitive instances of aspiration were noted while the fluoro was on, but coughing was noted between swallows following repeated penetration events and aspiration is suspected. The study was completed with the PMSV donned and doffed with some reduction in laryngeal invasion noted when the PMSV was in place. A dysphagia 3 diet with nectar thick liquids will be initiated at this time. However, pt's prognosis for advancement is judged to be good and SLP will continue to follow pt.   Swallow Evaluation Recommendations       SLP Diet Recommendations: Dysphagia 3 (Mech soft) solids;Nectar thick liquid   Liquid Administration via: Cup;Straw   Medication Administration: Whole  meds with liquid   Supervision: Patient able to self feed   Compensations: Slow rate;Small sips/bites;Follow solids with liquid   Postural Changes: Seated upright at 90 degrees   Oral Care Recommendations: Oral care BID;Patient independent with oral care      Delina Kruczek I. Hardin Negus, [place], [place] Office number [phone] Pager [phone]   [name] 10/07/2021,2:50 PM

## 2021-10-07 NOTE — Evaluation (Signed)
[facility] Speaking Valve - Evaluation Patient Details  Name: Maureen Jones MRN: [id number] Date of Birth: 1966/09/12  Today's Date: 10/07/2021 Time: 0930-0945 SLP Time Calculation (min) (ACUTE ONLY): 15 min  Past Medical History:  Past Medical History:  Diagnosis Date   Asthma    Diabetes ([place])    Glottic and subglottic edema 2022   Hypertension    Past Surgical History: History reviewed. No pertinent surgical history. HPI:  Pt is a 55 y/o female admitted with AMS 10/10. Stroke w/u negative, dx with acute metabolic encephalopathy in the setting of hypertensive emergency. ETT 10/10-10/12; reintubated 10/12 d/t increased WOB, not mobilizing secretions, stridor, received steroids, extubated 10/14 with lingering stridor, which was improving 10/15. ENT scoped pt 10/18: "Edema of the glottis and subglottic larynx following recent intubation and then repeat intubation.  There is no evidence of cord paralysis or any tumor present.  There is currently no stridor.  Allow more time for resolution of the swelling.  Unfortunately she is at risk for developing subglottic stenosis." SLP followed pt 10/15-10/24. MBS 10/19: trace posterior spillage, mild pharyngeal delay, trace transient penetration; regular texture diet with thin liquids recommended and SLP ultimately signed off with pt demonstrating adequate tolerance. 10/25: Stridor again noted; pt intubated and trach placed. Cortrak placed 10/26.    Assessment / Plan / Recommendation  Clinical Impression  Pt was seen for PMSV evaluation. Pt was educated regarding the anatomy of the larynx, the impact of the trach on voicing, and the goals of the session. Pt verbalized understanding. Cuff was inflated upon SLP's arrival and pt tolerated cuff deflation well. She presented with vitals of RR 30, SpO2 99, and HR 88 at baseline and RR 25, SpO2 99, and HR 89 upon cuff deflation. She tolerated PMSV for 57 minutes including the time of the swallow evaluation  and it being left on while the SLP documented outside her room. Vitals ranged RR 17-26, SpO2 98-100, and HR 87-90. Pt was able to expectorate secretions orally and via trach. No signs of air trapping were noted during the evaluation. Vocal quality was hoarse and intensity was reduced. She denied any perceivable change in breath support, but WOB was mildly increased and she exhibited difficulty adequately coordinating respiration with speech during conversation. PMSV was left on at the end of the session, but subsequently removed by this SLP when SLP noted pt to fall asleep. PMSV may be used during all therapies with at least intermittent supervision. SLP will continue to follow pt. SLP Visit Diagnosis: Aphonia (R49.1)    SLP Assessment  Patient needs continued Speech [facility]    Recommendations for follow up therapy are one component of a multi-disciplinary discharge planning process, led by the attending physician.  Recommendations may be updated based on patient status, additional functional criteria and insurance authorization.  Follow Up Recommendations   (TBD)    Frequency and Duration min 2x/week  2 weeks    PMSV Trial PMSV was placed for: 50 Able to redirect subglottic air through upper airway: Yes Able to Attain Phonation: Yes Voice Quality: Hoarse Able to Expectorate Secretions: Yes Level of Secretion Expectoration with PMSV: Oral Breath Support for Phonation: Mildly decreased Intelligibility: Intelligibility reduced Word: 75-100% accurate Phrase: 75-100% accurate Sentence: 75-100% accurate Conversation: 75-100% accurate Respirations During Trial:  (17-25) SpO2 During Trial:  (98-100) Pulse During Trial:  (87-90) Behavior: Alert;Cooperative;Expresses self well;Good eye contact;Responsive to questions   Tracheostomy Tube       Vent Dependency  FiO2 (%): 35 %  Cuff Deflation Trial Tolerated Cuff Deflation: Yes Length of Time for Cuff Deflation  Trial: 60+ minutes Behavior: Alert;Cooperative;Expresses self well;Good eye contact    Jonathandavid Marlett I. Hardin Negus, [place], [place] Office number [phone] Pager [phone]          [name] 10/07/2021, 11:00 AM

## 2021-10-07 NOTE — Progress Notes (Signed)
RT NOTE: patient was placed back on ventilator, at 1325, due to increased WOB and use of accessory muscles.  Tolerating well at this time.  Will continue to monitor.

## 2021-10-07 NOTE — Evaluation (Signed)
Clinical/Bedside Swallow Evaluation Patient Details  Name: Maureen Jones MRN: [id number] Date of Birth: 1966/06/07  Today's Date: 10/07/2021 Time: SLP Start Time (ACUTE ONLY): 0946 SLP Stop Time (ACUTE ONLY): 1010 SLP Time Calculation (min) (ACUTE ONLY): 24 min  Past Medical History:  Past Medical History:  Diagnosis Date   Asthma    Diabetes ([place])    Glottic and subglottic edema 2022   Hypertension    Past Surgical History: History reviewed. No pertinent surgical history. HPI:  Pt is a 55 y/o female admitted with AMS 10/10. Stroke w/u negative, dx with acute metabolic encephalopathy in the setting of hypertensive emergency. ETT 10/10-10/12; reintubated 10/12 d/t increased WOB, not mobilizing secretions, stridor, received steroids, extubated 10/14 with lingering stridor, which was improving 10/15. ENT scoped pt 10/18: "Edema of the glottis and subglottic larynx following recent intubation and then repeat intubation.  There is no evidence of cord paralysis or any tumor present.  There is currently no stridor.  Allow more time for resolution of the swelling.  Unfortunately she is at risk for developing subglottic stenosis." SLP followed pt 10/15-10/24. MBS 10/19: trace posterior spillage, mild pharyngeal delay, trace transient penetration; regular texture diet with thin liquids recommended and SLP ultimately signed off with pt demonstrating adequate tolerance. 10/25: Stridor again noted; pt intubated and trach placed. Cortrak placed 10/26.    Assessment / Plan / Recommendation  Clinical Impression  Pt was seen for bedside swallow evaluation with PMSV in place throughout the evaluation. She reported that she was told to avoid straws after the MBS on 10/19, but did not have any difficulty swallowing PTA. Oral mechanism exam was WNL and she presented with adequate, natural dentition. Delayed throat clearing was noted following trials, but she tolerated all solids and liquids without immediate  s/sx of aspiration. Mastication and oral clearance were WNL. Considering the potential impact of the trach on swallowing, a modified barium swallow study will be conducted prior to initiated a p.o. diet. It is currently scheduled for today at 1230 and pt may have ice chips and water until it is completed. SLP Visit Diagnosis: Dysphagia, oropharyngeal phase (R13.12)    Aspiration Risk  Mild aspiration risk    Diet Recommendation Ice chips PRN after oral care   Liquid Administration via: Cup;No straw Medication Administration: Via alternative means Compensations: Slow rate;Small sips/bites Postural Changes: Seated upright at 90 degrees    Other  Recommendations Oral Care Recommendations: Oral care BID    Recommendations for follow up therapy are one component of a multi-disciplinary discharge planning process, led by the attending physician.  Recommendations may be updated based on patient status, additional functional criteria and insurance authorization.  Follow up Recommendations  (TBD)      Frequency and Duration min 2x/week  2 weeks       Prognosis Prognosis for Safe Diet Advancement: Good      Swallow Study   General Date of Onset: 09/29/21 HPI: Pt is a 55 y/o female admitted with AMS 10/10. Stroke w/u negative, dx with acute metabolic encephalopathy in the setting of hypertensive emergency. ETT 10/10-10/12; reintubated 10/12 d/t increased WOB, not mobilizing secretions, stridor, received steroids, extubated 10/14 with lingering stridor, which was improving 10/15. ENT scoped pt 10/18: "Edema of the glottis and subglottic larynx following recent intubation and then repeat intubation.  There is no evidence of cord paralysis or any tumor present.  There is currently no stridor.  Allow more time for resolution of the swelling.  Unfortunately she is at  risk for developing subglottic stenosis." SLP followed pt 10/15-10/24. MBS 10/19: trace posterior spillage, mild pharyngeal delay,  trace transient penetration; regular texture diet with thin liquids recommended and SLP ultimately signed off with pt demonstrating adequate tolerance. 10/25: Stridor again noted; pt intubated and trach placed. Cortrak placed 10/26. Type of Study: Bedside Swallow Evaluation Previous Swallow Assessment: See HPI Diet Prior to this Study: NPO;NG Tube Temperature Spikes Noted: No Respiratory Status: Trach Collar;Trach History of Recent Intubation: Yes Length of Intubations (days): 5 days Date extubated: 10/06/21 (trach 10/25) Behavior/Cognition: Alert;Cooperative;Pleasant mood Oral Cavity Assessment: Within Functional Limits Oral Care Completed by SLP: Recent completion by staff Oral Cavity - Dentition: Adequate natural dentition Vision: Functional for self-feeding Self-Feeding Abilities: Able to feed self Patient Positioning: Upright in chair;Postural control adequate for testing Baseline Vocal Quality: Hoarse Volitional Cough: Strong Volitional Swallow: Able to elicit    Oral/Motor/Sensory Function Overall Oral Motor/Sensory Function: Within functional limits   Ice Chips Ice chips: Within functional limits Presentation: Spoon   Thin Liquid Thin Liquid: Within functional limits Presentation: Cup    Nectar Thick Nectar Thick Liquid: Not tested   Honey Thick Honey Thick Liquid: Not tested   Puree Puree: Within functional limits Presentation: Spoon   Solid     Solid: Within functional limits Presentation: [place] I. Hardin Negus, [place], [place] Office number [phone] Pager [phone]  [name] 10/07/2021,11:05 AM

## 2021-10-07 NOTE — Progress Notes (Signed)
Physical Therapy Treatment Patient Details Name: Maureen Jones MRN: [id number] DOB: [birth year] Today's Date: 10/07/2021   History of Present Illness 55 y.o. female admitted 09/20/21 with AMS. 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 CXR with low lung volumes, L>R interstitial opacities. Acute metabolic encephalopathy in setting of HTN urgency, possible PRES. ETT (10/10-10/12, 10/12-10/14). Course complicated by confusion/agitation overnight 10/17. 10/25 trach and bronch with return to vent. PMHx:HTN, DM.    PT Comments    Pt pleasant and very willing to mobilize. Pt able to recognize need for BM and progress to walking to bathroom and in hall. Pt initially on 35% trach collar with SpO2 90% moving to toilet with pt stating SOB and increased to 40% for increased gait with pt limited by dizziness and SOB with max desaturation to 88% during gait. Sitting BP 167/74 (99). Pt educated for progressive mobility and current need for RW for stability. Will continue to follow to maximize function and encouraged OOB to chair and toilet throughout day with nursing staff.     Recommendations for follow up therapy are one component of a multi-disciplinary discharge planning process, led by the attending physician.  Recommendations may be updated based on patient status, additional functional criteria and insurance authorization.  Follow Up Recommendations  Home health PT     Assistance Recommended at Discharge Intermittent Supervision/Assistance  Equipment Recommendations  Rolling walker (2 wheels);3in1 (PT)    Recommendations for Other Services       Precautions / Restrictions Precautions Precautions: Fall;Other (comment) Precaution Comments: trach, cortrak, picc     Mobility  Bed Mobility Overal bed mobility: Needs Assistance Bed Mobility: Supine to Sit     Supine to sit: HOB elevated;Min guard     General bed mobility comments: HOB 35 degrees with pt able to transition to EOB with  guarding for lines only but no physical assist    Transfers Overall transfer level: Needs assistance   Transfers: Sit to/from Stand Sit to Stand: Min guard           General transfer comment: guarding for lines with pt able to stand from bed, recliner and toilet with riser    Ambulation/Gait Ambulation/Gait assistance: Min assist Gait Distance (Feet): 80 Feet Assistive device: Rolling walker (2 wheels) Gait Pattern/deviations: Step-through pattern;Decreased stride length   Gait velocity interpretation: <1.8 ft/sec, indicate of risk for recurrent falls General Gait Details: pt maintaining self posterior to RW with cues for safety, proximity and safety. Pt walked 8' to and from bathroom with seated rest prior ot 53' with RW. Pt with bump to 40% for increased gait with sats 88-95% with cues for breathing and pt accurately limiting mobility   Stairs             Wheelchair Mobility    Modified Rankin (Stroke Patients Only)       Balance Overall balance assessment: Needs assistance   Sitting balance-[name]: Good Sitting balance - Comments: EOb and toilet without assist   Standing balance support: Bilateral upper extremity supported Standing balance-[name]: Poor Standing balance comment: bil UE support in standing and gait                            Cognition Arousal/Alertness: Awake/alert Behavior During Therapy: WFL for tasks assessed/performed Overall Cognitive Status: Within Functional Limits for tasks assessed  General Comments: pt with trach but appropriately following commands and mouthing words        Exercises      General Comments        Pertinent Vitals/Pain Pain Assessment: Faces Pain Score: 4  Faces Pain Scale: Hurts little more Facial Expression: Relaxed, neutral Body Movements: Absence of movements Muscle Tension: Relaxed Compliance with ventilator (intubated pts.):  Tolerating ventilator or movement Vocalization (extubated pts.): N/A CPOT Total: 0 Pain Location: throat Pain Descriptors / Indicators: Tender Pain Intervention(s): Limited activity within patient's tolerance;Monitored during session    Home Living                          Prior Function            PT Goals (current goals can now be found in the care plan section) Progress towards PT goals: Progressing toward goals    Frequency    Min 3X/week      PT Plan Current plan remains appropriate    Co-evaluation              AM-PAC PT "6 Clicks" Mobility   Outcome Measure  Help needed turning from your back to your side while in a flat bed without using bedrails?: A Little Help needed moving from lying on your back to sitting on the side of a flat bed without using bedrails?: A Little Help needed moving to and from a bed to a chair (including a wheelchair)?: A Little Help needed standing up from a chair using your arms (e.g., wheelchair or bedside chair)?: A Little Help needed to walk in hospital room?: A Little Help needed climbing 3-5 steps with a railing? : A Lot 6 Click Score: 17    End of Session Equipment Utilized During Treatment: Oxygen Activity Tolerance: Patient tolerated treatment well Patient left: in chair;with call bell/phone within reach;with chair alarm set Nurse Communication: Mobility status PT Visit Diagnosis: Unsteadiness on feet (R26.81);Other abnormalities of gait and mobility (R26.89);Difficulty in walking, not elsewhere classified (R26.2)     Time: 4158-3094 PT Time Calculation (min) (ACUTE ONLY): 37 min  Charges:  $Gait Training: 8-22 mins $Therapeutic Activity: 8-22 mins                     Pradeep Beaubrun P, PT Acute Rehabilitation Services Pager: [phone] Office: [place] 10/07/2021, 9:38 AM

## 2021-10-07 NOTE — Progress Notes (Signed)
Occupational Therapy Treatment Patient Details Name: Maureen Jones MRN: [id number] DOB: [birth year] Today's Date: 10/07/2021   History of present illness 55 y.o. female admitted 09/20/21 with AMS. 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 CXR with low lung volumes, L>R interstitial opacities. Acute metabolic encephalopathy in setting of HTN urgency, possible PRES. ETT (10/10-10/12, 10/12-10/14). Course complicated by confusion/agitation overnight 10/17. 10/25 trach and bronch with return to vent. PMHx:HTN, DM.   OT comments  Patient seated in recliner upon entry and requesting to use restroom.  Patient on 8L trach collar at 35% FiO2 during session VSS.  Pt completing transfers with min guard using RW with cueing for hand placement and safety, functional mobility in room with min assist using RW.  Cueing throughout session for pacing and safety, impulsive at times but easily redirected with verbal cueing.  She is fatigued and requests to rest after toileting, as planned MBS this afternoon.  Will follow acutely, HHOT remains appropriate at this time.    Recommendations for follow up therapy are one component of a multi-disciplinary discharge planning process, led by the attending physician.  Recommendations may be updated based on patient status, additional functional criteria and insurance authorization.    Follow Up Recommendations  Home health OT    Assistance Recommended at Discharge Frequent or constant Supervision/Assistance  Equipment Recommendations  BSC    Recommendations for Other Services      Precautions / Restrictions Precautions Precautions: Fall;Other (comment) Precaution Comments: trach, cortrak, picc Restrictions Weight Bearing Restrictions: No       Mobility Bed Mobility Overal bed mobility: Needs Assistance Bed Mobility: Supine to Sit     Supine to sit: HOB elevated;Min guard     General bed mobility comments: OOB in recliner upon entry     Transfers Overall transfer level: Needs assistance Equipment used: Rolling walker (2 wheels) Transfers: Sit to/from Stand Sit to Stand: Min guard           General transfer comment: min guard for safety and balance, cueing for hand placement during transfer     Balance Overall balance assessment: Needs assistance Sitting-balance support: No upper extremity supported;Feet supported Sitting balance-[name]: Good Sitting balance - Comments: EOb and toilet without assist   Standing balance support: Bilateral upper extremity supported;During functional activity;No upper extremity supported Standing balance-[name]: Poor Standing balance comment: relies on BUE support dynamically, able to engage in toileting needs with 0-1 hand support and external support                           ADL either performed or assessed with clinical judgement   ADL Overall ADL's : Needs assistance/impaired     Grooming: Set up;Sitting                   Toilet Transfer: Minimal assistance;Ambulation;Regular Toilet;Rolling walker (2 wheels);BSC Toilet Transfer Details (indicate cue type and reason): into bathroom- BSC over toilet Toileting- Clothing Manipulation and Hygiene: Min guard;Sit to/from stand       Functional mobility during ADLs: Minimal assistance;Rolling walker (2 wheels);Cueing for safety General ADL Comments: pt fatigues easily, on trach collar     Vision       Perception     Praxis      Cognition Arousal/Alertness: Awake/alert Behavior During Therapy: WFL for tasks assessed/performed Overall Cognitive Status: Impaired/Different from baseline Area of Impairment: Safety/judgement  Safety/Judgement: Decreased awareness of safety     General Comments: pt with trach but following commands, aware of needs and mouthing words during session. Mildly impulsive with decreased safety, but easliy redirected.   Continue  assessment.          Exercises     Shoulder Instructions       General Comments VSS on Trach Collar at 8L, 35% fiO2    Pertinent Vitals/ Pain       Pain Assessment: Faces Pain Score: 10-Worst pain ever Faces Pain Scale: Hurts even more Pain Location: neck/throat Pain Descriptors / Indicators: Tender Pain Intervention(s): Limited activity within patient's tolerance;Monitored during session;Repositioned  Home Living                                          Prior Functioning/Environment              Frequency  Min 2X/week        Progress Toward Goals  OT Goals(current goals can now be found in the care plan section)  Progress towards OT goals: Progressing toward goals     Plan Discharge plan remains appropriate;Frequency remains appropriate    Co-evaluation                 AM-PAC OT "6 Clicks" Daily Activity     Outcome Measure   Help from another person eating meals?: None Help from another person taking care of personal grooming?: A Little Help from another person toileting, which includes using toliet, bedpan, or urinal?: A Little Help from another person bathing (including washing, rinsing, drying)?: A Little Help from another person to put on and taking off regular upper body clothing?: A Little Help from another person to put on and taking off regular lower body clothing?: A Little 6 Click Score: 19    End of Session Equipment Utilized During Treatment: Rolling walker (2 wheels);Oxygen (8L TC)  OT Visit Diagnosis: Other abnormalities of gait and mobility (R26.89);Other symptoms and signs involving cognitive function   Activity Tolerance Patient tolerated treatment well   Patient Left in chair;with call bell/phone within reach;with chair alarm set;with nursing/sitter in room   Nurse Communication Mobility status        Time: 1114-1130 OT Time Calculation (min): 16 min  Charges: OT General Charges $OT Visit: 1  Visit OT Treatments $Self Care/Home Management : 8-22 mins  Jolaine Artist, OT [place] Pager [phone] Office [place] 10/07/2021, 12:11 PM

## 2021-10-07 NOTE — Progress Notes (Signed)
NAMEEmberleigh Jones, MRN:  [id number], DOB:  08-15-1966, LOS: 16 ADMISSION DATE:  09/20/2021, CONSULTATION DATE: 10/10 REFERRING MD:  Dr. Laverta Baltimore, CHIEF COMPLAINT:  AMS   History of Present Illness:  55 y/o F who presented to [facility] on 10/10 with reports of acute altered mental status.   She was reportedly at home and went to smoke a cigarette 0200 with her nephew. She was apparently seen by her family at that time.  After smoking, the family heard a loud thud and she was found altered and confused.  On arrival to the ER she was noted to be agitated, confused, localized to pain and stated "that hurts" with stimulation.  She was seen initially as a CODE STROKE.  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 She was hypertensive with initial pressure of 240/112 with MAP of 136.  Initial labs showed glucose of 437, BUN 36, Sr CR 2.17, CK 316, ammonia 30, Mg 1.9, WBC 14.4. UDS was negative.  COVID & flu screening were negative.  CXR showed low lung volumes, ETT in good position, L>R interstitial opacities.    PCCM called for ICU admission.   Pertinent  Medical History  HTN DM   Significant Hospital Events: Including procedures, antibiotic start and stop dates in addition to other pertinent events   10/10 Admit with acute encephalopathy, hypertensive 10/12 extubated, increased WOB, re-intubated 10/14 extubated , lingering stridor, Received racemic epi x2 over the last 24 hours + another dose of Decadron, Mild agitation requiring restart of Precedex 10/15 swallow evaluation >> dysphagia 3 diet 10/20 decompensated after switch to PO steroids 10/21 better after CAT and back on IV steroids 10/22 agitated back on  BIPAP 10/25 stridor again. Marked work of breathing. Intubated, airway noises, stridor and wheezing completely resolved post intubation. Spoke to ENT who will follow after trach. Trach placed by Dr Vaughan Browner. Seroquel increased to 100  qhs, changes solumedrol to prednisone to begin taper, rested on propofol over night. Tubefeeds started. RUE PICC placed  10/26 prop weaning off attempting ATC   Interim History / Subjective:  Remains on Trach collar   Objective   Blood pressure (!) 165/90, pulse 95, temperature 99.5 F (37.5 C), temperature source Oral, resp. rate (!) 22, height 5\' 6"  (1.676 m), weight 103.2 kg, SpO2 92 %.    Vent Mode: PSV;CPAP FiO2 (%):  [35 %-40 %] 35 % PEEP:  [5 cmH20] 5 cmH20 Pressure Support:  [10 cmH20-15 cmH20] 15 cmH20   Intake/Output Summary (Last 24 hours) at 10/07/2021 1032 Last data filed at 10/07/2021 0600 Gross per 24 hour  Intake 1127.7 ml  Output 1650 ml  Net -522.3 ml   Filed Weights   10/01/21 0500 10/04/21 0238 10/06/21 0244  Weight: 104.6 kg 107.4 kg 103.2 kg    Examination: General Adult female, sitting in chair, no distress   HENT 6 cuffed shiley with some noted secretions  Pulm coarse breaths sounds, no use of accessory muscles  Card RRR, HR 82 Abd soft not tender, active bowels sounds   Ext -edema  Neuro alert, oriented, follows commands, pupils intact and reactive   Resolved Hospital Problem list   RML/RLL PNA (treated) Hypertensive emergency  Assessment & Plan:   Acute respiratory failure with hypoxia 2/2 mixed picture of VCD/post intubation subglottic and glottic edema/vocal hoarseness  +/- asthmatic exacerbation.  -now has trach. Wheezing COMPLETELY resolved after UAW bypassed post intubation 10/25 Plan -Continue Trach Collar >  has remained since 3 am, Vent PRN  -Continue Steroid Taper  -Cont BDs ans ICS -Cont clairitin and singulair  -ST planning for swallow today   HTN Initial BP 240/112, MAP 136  Echo moderate LVH Plan -Cont amlodipine, coreg and hydralazine >increase hydralazine to 100 mg q8h and Coreg to 50 mg BID   Acute Metabolic Encephalopathy due to hypertensive emergency. C/b anxiety Imaging negative for PRES. CT head negative. MRI  negative. Has had sundowning at night this admission, even nights without respiratory distress; I wonder if the steroids are also contributing at this point Plan -Cont Seroquel at 100 qhs (increased 10/25) -Cont prozac (added 10/22) -Taper steroids -BID Clonazepam added 10/22 > Change to 0.5 mg PRN  AKI, improving 10/2020 cr 1.7. Cr improved after gentle hydration 10/11. -slowly improving Plan -Avoid nephrotoxins & renal adjust meds -Trend BMP >> AM Labs pending   DM with uncontrolled hyperglycemia -inc'd levemir to 35 bid 10/25 Plan -Cont levemir at 35 bid > Decrease to 30 BID, will need to be adjusted as sterids are tapered  -Cont ssi (goal 140-180)   Acute anemia due to critical illness Plan -Trend CBC, Transfuse for hemglobin <7  Best Practice (right click and "Reselect all SmartList Selections" daily)   Diet/type: tubefeeds DVT prophylaxis: prophylactic heparin  GI prophylaxis: PPI Lines: PICC 10/25, difficult access  Foley:  N/A Code Status:  full code Last date of multidisciplinary goals of care discussion [spoke to family on 10/25)   Hayden Pedro, AGACNP-BC [place] Pgr: [phone]

## 2021-10-08 DIAGNOSIS — J383 Other diseases of vocal cords: Secondary | ICD-10-CM | POA: Diagnosis not present

## 2021-10-08 DIAGNOSIS — J384 Edema of larynx: Secondary | ICD-10-CM | POA: Diagnosis not present

## 2021-10-08 DIAGNOSIS — J9600 Acute respiratory failure, unspecified whether with hypoxia or hypercapnia: Secondary | ICD-10-CM | POA: Diagnosis not present

## 2021-10-08 LAB — CBC
HCT: 28.4 % — ABNORMAL LOW (ref 36.0–46.0)
Hemoglobin: 8.8 g/dL — ABNORMAL LOW (ref 12.0–15.0)
MCH: 29 pg (ref 26.0–34.0)
MCHC: 31 g/dL (ref 30.0–36.0)
MCV: 93.7 fL (ref 80.0–100.0)
Platelets: 182 10*3/uL (ref 150–400)
RBC: 3.03 MIL/uL — ABNORMAL LOW (ref 3.87–5.11)
RDW: 14.3 % (ref 11.5–15.5)
WBC: 15.2 10*3/uL — ABNORMAL HIGH (ref 4.0–10.5)
nRBC: 0 % (ref 0.0–0.2)

## 2021-10-08 LAB — BASIC METABOLIC PANEL
Anion gap: 5 (ref 5–15)
BUN: 53 mg/dL — ABNORMAL HIGH (ref 6–20)
CO2: 27 mmol/L (ref 22–32)
Calcium: 9.4 mg/dL (ref 8.9–10.3)
Chloride: 107 mmol/L (ref 98–111)
Creatinine, Ser: 2.18 mg/dL — ABNORMAL HIGH (ref 0.44–1.00)
GFR, Estimated: 26 mL/min — ABNORMAL LOW (ref >60)
Glucose, Bld: 167 mg/dL — ABNORMAL HIGH (ref 70–99)
Potassium: 4 mmol/L (ref 3.5–5.1)
Sodium: 139 mmol/L (ref 135–145)

## 2021-10-08 LAB — PHOSPHORUS: Phosphorus: 3.5 mg/dL (ref 2.5–4.6)

## 2021-10-08 LAB — GLUCOSE, CAPILLARY
Glucose-Capillary: 146 mg/dL — ABNORMAL HIGH (ref 70–99)
Glucose-Capillary: 145 mg/dL — ABNORMAL HIGH (ref 70–99)
Glucose-Capillary: 209 mg/dL — ABNORMAL HIGH (ref 70–99)
Glucose-Capillary: 306 mg/dL — ABNORMAL HIGH (ref 70–99)
Glucose-Capillary: 369 mg/dL — ABNORMAL HIGH (ref 70–99)

## 2021-10-08 LAB — MAGNESIUM: Magnesium: 1.8 mg/dL (ref 1.7–2.4)

## 2021-10-08 MED ORDER — CHLORHEXIDINE GLUCONATE 0.12 % MT SOLN
15.0000 mL | Freq: Two times a day (BID) | OROMUCOSAL | Status: DC
  Administered 2021-10-09 – 2021-10-13 (×9): 15 mL via OROMUCOSAL
  Filled 2021-10-08 (×10): qty 15

## 2021-10-08 MED ORDER — ARFORMOTEROL TARTRATE 15 MCG/2ML IN NEBU
15.0000 ug | INHALATION_SOLUTION | Freq: Two times a day (BID) | RESPIRATORY_TRACT | Status: DC
  Administered 2021-10-08 – 2021-11-15 (×77): 15 ug via RESPIRATORY_TRACT
  Filled 2021-10-08 (×79): qty 2

## 2021-10-08 MED ORDER — INSULIN DETEMIR 100 UNIT/ML ~~LOC~~ SOLN
35.0000 [IU] | Freq: Two times a day (BID) | SUBCUTANEOUS | Status: DC
  Administered 2021-10-08 (×2): 35 [IU] via SUBCUTANEOUS
  Filled 2021-10-08 (×4): qty 0.35

## 2021-10-08 MED ORDER — ORAL CARE MOUTH RINSE
15.0000 mL | Freq: Two times a day (BID) | OROMUCOSAL | Status: DC
  Administered 2021-10-08 – 2021-10-12 (×9): 15 mL via OROMUCOSAL

## 2021-10-08 MED ORDER — MAGNESIUM SULFATE 2 GM/50ML IV SOLN
2.0000 g | Freq: Once | INTRAVENOUS | Status: AC
  Administered 2021-10-08: 2 g via INTRAVENOUS
  Filled 2021-10-08: qty 50

## 2021-10-08 MED ORDER — FLUTICASONE PROPIONATE 50 MCG/ACT NA SUSP
2.0000 | Freq: Every day | NASAL | Status: DC
  Administered 2021-10-09 – 2021-11-13 (×31): 2 via NASAL
  Filled 2021-10-08 (×4): qty 16

## 2021-10-08 MED ORDER — OSMOLITE 1.5 CAL PO LIQD
355.0000 mL | Freq: Three times a day (TID) | ORAL | Status: DC | PRN
  Filled 2021-10-08 (×3): qty 474

## 2021-10-08 MED ORDER — ENSURE ENLIVE PO LIQD
237.0000 mL | Freq: Two times a day (BID) | ORAL | Status: DC
  Administered 2021-10-10 – 2021-10-13 (×7): 237 mL via ORAL

## 2021-10-08 MED ORDER — BUDESONIDE 0.25 MG/2ML IN SUSP
0.2500 mg | Freq: Two times a day (BID) | RESPIRATORY_TRACT | Status: DC
  Administered 2021-10-08 – 2021-11-15 (×76): 0.25 mg via RESPIRATORY_TRACT
  Filled 2021-10-08 (×77): qty 2

## 2021-10-08 NOTE — Progress Notes (Signed)
NAMENubia Jones, MRN:  [id number], DOB:  08/25/1966, LOS: 57 ADMISSION DATE:  09/20/2021, CONSULTATION DATE: 10/10 REFERRING MD:  Dr. Laverta Baltimore, CHIEF COMPLAINT:  AMS   History of Present Illness:  55 y/o F who presented to [facility] on 10/10 with reports of acute altered mental status secondary to hypertensive emergency with hypoxic respiratory failure related to VCD/post intubation subglottic and glottic edema/vocal hoarseness  +/- asthmatic exacerbation.  Ultimately required trach placement.   Pertinent  Medical History  HTN DM   Significant Hospital Events: Including procedures, antibiotic start and stop dates in addition to other pertinent events   10/10 Admit with acute encephalopathy, hypertensive 10/12 extubated, increased WOB, re-intubated 10/14 extubated , lingering stridor, Received racemic epi x2 over the last 24 hours + another dose of Decadron, Mild agitation requiring restart of Precedex 10/15 swallow evaluation >> dysphagia 3 diet 10/20 decompensated after switch to PO steroids 10/21 better after CAT and back on IV steroids 10/22 agitated back on  BIPAP 10/25 stridor again. Marked work of breathing. Intubated, airway noises, stridor and wheezing completely resolved post intubation. Spoke to ENT who will follow after trach. Trach placed by Dr Vaughan Browner. Seroquel increased to 100 qhs, changes solumedrol to prednisone to begin taper, rested on propofol over night. Tubefeeds started. RUE PICC placed  10/26 prop weaning off attempting ATC   Interim History / Subjective:  Alert and interactive this am, currently eating breakfast without difficulty. Tolerating PMV. Did not require ventilator overnight  Objective   Blood pressure (!) 159/81, pulse 80, temperature 98.6 F (37 C), temperature source Oral, resp. rate 19, height 5\' 6"  (1.676 m), weight 102.5 kg, SpO2 98 %.    Vent Mode: PRVC FiO2 (%):  [35 %-40 %] 35 % Set Rate:  [18 bmp] 18 bmp Vt Set:  [470 mL] 470 mL PEEP:  [5  cmH20] 5 cmH20 Plateau Pressure:  [19 cmH20] 19 cmH20   Intake/Output Summary (Last 24 hours) at 10/08/2021 0806 Last data filed at 10/07/2021 2200 Gross per 24 hour  Intake 250 ml  Output 700 ml  Net -450 ml    Filed Weights   10/04/21 0238 10/06/21 0244 10/08/21 0400  Weight: 107.4 kg 103.2 kg 102.5 kg    Examination: General Adult female, sitting in chair, no distress   HENT 6 cuffed shiley, sutures in place, midline. Minimal drainage  Pulm coarse breaths sounds, no use of accessory muscles, minimal secretions via trach Card RRR, no edema, warm and well perfused Abd soft not tender, active bowels sounds   Ext no rashes or deformities Neuro alert, oriented, follows commands, pupils intact and reactive   Resolved Hospital Problem list   RML/RLL PNA (treated) Hypertensive emergency  Assessment & Plan:   Acute respiratory failure with hypoxia 2/2 mixed picture of VCD/post intubation subglottic and glottic edema/vocal hoarseness  +/- asthmatic exacerbation.  -now has trach. Wheezing COMPLETELY resolved after UAW bypassed post intubation 10/25 Plan -Trach placed 10/25 -Vent last used 10/27 -Sutures to be removed 11/1 -Continue Steroid Taper -Cont Bds, Claritin, singulair -PMV as tolerated  HTN -Cont antihypertensives per primary team  Acute Metabolic Encephalopathy due to hypertensive emergency. C/b anxiety (resolved) Imaging   AKI, improving 10/2020 cr 1.7. Cr improved after gentle hydration 10/11. -slowly improving  DM with uncontrolled hyperglycemia -SSI and long acting insulin per primary team  Acute anemia due to critical illness -No bleeding, Transfuse for hemglobin <7  PCCM will continue to follow for trach, will see Friday unless needed sooner.  Maureen Jones, ACNP [facility]  Please use secure chat

## 2021-10-08 NOTE — Progress Notes (Signed)
Sputum culture labeled and walked down to lab w/ requisition.  Placed in lab bin, lab employee aware.

## 2021-10-08 NOTE — Progress Notes (Signed)
Speech Language Pathology Treatment: Dysphagia  Patient Details Name: Maureen Jones MRN: [id number] DOB: 1966/01/02 Today's Date: 10/08/2021 Time: 1041-1100 SLP Time Calculation (min) (ACUTE ONLY): 19 min  Assessment / Plan / Recommendation Clinical Impression  Pt was seen for treatment. She reported that she was very tired and quickly fell asleep once stimuli were removed. Pt tolerated nectar thick (i.e., mildly thick) and trials of slightly thick liquids without overt s/sx of aspiration. Pt tolerated PMSV with stable vitals, and no increase in respiratory effort. Vocal quality was improved compared to yesterday, but vocal intensity was still reduced. Pt's case was discussed with Estill Bamberg, RD and who advised that pt's TFs will be adjusted to facilitate improved p.o. intake and, hopefully, Cortrak removal in the near future. Pt's session was abbreviated to allow her to have a breathing treatment and to rest. Pt's current diet of dysphagia 3 solids and nectar thick liquids will be continued. Pt reported that she does not currently mind the thickened liquids. SLP will continue to follow pt.      HPI: Pt is a 55 y/o female admitted with AMS 10/10. Stroke w/u negative, dx with acute metabolic encephalopathy in the setting of hypertensive emergency. ETT 10/10-10/12; reintubated 10/12 d/t increased WOB, not mobilizing secretions, stridor, received steroids, extubated 10/14 with lingering stridor, which was improving 10/15. ENT scoped pt 10/18: "Edema of the glottis and subglottic larynx following recent intubation and then repeat intubation.  There is no evidence of cord paralysis or any tumor present.  There is currently no stridor.  Allow more time for resolution of the swelling.  Unfortunately she is at risk for developing subglottic stenosis." SLP followed pt 10/15-10/24. MBS 10/19: trace posterior spillage, mild pharyngeal delay, trace transient penetration; regular texture diet with thin liquids  recommended and SLP ultimately signed off with pt demonstrating adequate tolerance. 10/25: Stridor again noted; pt intubated and trach placed. Cortrak placed 10/26.      SLP Plan         Recommendations for follow up therapy are one component of a multi-disciplinary discharge planning process, led by the attending physician.  Recommendations may be updated based on patient status, additional functional criteria and insurance authorization.    Recommendations  Diet recommendations: Dysphagia 3 (mechanical soft);Nectar-thick liquid Liquids provided via: Cup;Straw Medication Administration: Whole meds with puree Supervision: Patient able to self feed Compensations: Slow rate;Small sips/bites;Follow solids with liquid                Follow up Recommendations:  (TBD) SLP Visit Diagnosis: Dysphagia, oropharyngeal phase (R13.12)       Hartwell Vandiver I. Hardin Negus, [place], [place] Office number [phone] Pager [place]  10/08/2021, 11:20 AM

## 2021-10-08 NOTE — Progress Notes (Signed)
PROGRESS NOTE    Maureen Jones  CHY:[id number] DOB: 1966-03-10 DOA: 09/20/2021 PCP: Pcp, No   Brief Narrative:  55 y/o F with PMH HTN, DM who presented to [facility] on 10/10 with reports of acute altered mental status. HTN Emergency.  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 She was hypertensive with initial pressure of 240/112. Intubated.  10/12 extubated, increased WOB, re-intubated. 10/14 extubated, lingering stridor, Received racemic epi x2 + another dose of Decadron, Mild agitation requiring restart of Precedex. Did not require re-intubation, however stay complicated by continued stridor. 10/18 ENT scope > showed edema of the glottis and subglottic larynx. Continue on high dose steroids. 10/25 stridor and respiratory distress, requiring intubation and tracheostomy. Now on [employer]. PT/OT/ST following. Pulmonary will continue to follow for trach management.    Significant Hospital Events: Including procedures, antibiotic start and stop dates in addition to other pertinent events   10/10 Admit with acute encephalopathy, hypertensive 10/12 extubated, increased WOB, re-intubated 10/14 extubated , lingering stridor, Received racemic epi x2 over the last 24 hours + another dose of Decadron, Mild agitation requiring restart of Precedex 10/15 swallow evaluation >> dysphagia 3 diet 10/20 decompensated after switch to PO steroids 10/21 better after CAT and back on IV steroids 10/22 agitated back on  BIPAP 10/25 stridor again. Marked work of breathing. Intubated, airway noises, stridor and wheezing completely resolved post intubation. Spoke to ENT who will follow after trach. Trach placed by Dr Vaughan Browner. Seroquel increased to 100 qhs, changes solumedrol to prednisone to begin taper, rested on propofol over night. Tubefeeds started. RUE PICC placed  10/26 prop weaning off attempting ATC     Assessment & Plan:   Active Problems:    Encephalopathy acute   Endotracheal tube present   Acute respiratory failure (HCC)   Subglottic edema   Vocal cord dysfunction  Acute respiratory failure with hypoxia 2/2 mixed picture of VCD/post intubation subglottic and glottic edema/vocal hoarseness  +/- asthmatic exacerbation: She was Intubated upon presentation,  10/12 extubated, increased WOB, re-intubated. 10/14 extubated, lingering stridor, Received racemic epi x2 + another dose of Decadron.  Seen by ENT, had epiglottic and subglottic edema, started on tapering dose of prednisone.  Now has trach.  We will continue steroid taper dose, continue Claritin, Singulair, BDs and ICS and management per pulmonology.   Hypertensive urgency:  Initial BP 240/112, MAP 136.  Echo with moderate LVH.  Blood pressure now controlled.  Continue amlodipine, Coreg, hydralazine.  Acute Metabolic Encephalopathy due to hypertensive emergency. C/b anxiety Imaging negative for PRES. CT head negative. MRI negative. Has had sundowning at night this admission, even nights without respiratory distress, this morning, she is fully alert and oriented.  -Cont Seroquel at 100 qhs (increased 10/25) -Cont prozac (added 10/22) -Taper steroids -BID Clonazepam added 10/22 > Change to 0.5 mg PRN   CKD stage IV: Please note that previous all progress notes have indicated that patient had AKI however after chart review, it appears that since admission, patient's creatinine has remained between 2-2 0.3 with a GFR that falls in CKD stage IV, there is no previous lab available to compare creatinine with.  I believe this is her CKD stage IV.  This is at baseline.   DM with uncontrolled hyperglycemia: Slightly elevated blood sugar, increase   Acute anemia due to critical illness: Hemoglobin stable around 9, transfuse if less than 7.  DVT prophylaxis: heparin injection 5,000 Units Start: 09/20/21 0845  SCDs Start: 09/20/21 0831   Code Status: Full Code  Family Communication:  None  present at bedside.  Plan of care discussed with patient in length and he verbalized understanding and agreed with it.  Status is: Inpatient  Remains inpatient appropriate because: Needs further work-up.  Medical management.  Estimated body mass index is 36.47 kg/m as calculated from the following:   Height as of this encounter: 5\' 6"  (1.676 m).   Weight as of this encounter: 102.5 kg.     Nutritional Assessment: Body mass index is 36.47 kg/m.Marland Kitchen Seen by dietician.  I agree with the assessment and plan as outlined below: Nutrition Status: Nutrition Problem: Inadequate oral intake Etiology: inability to eat Signs/Symptoms: NPO status Interventions: Ensure Enlive (each supplement provides 350kcal and 20 grams of protein), MVI  .  Skin Assessment: I have examined the patient's skin and I agree with the wound assessment as performed by the wound care RN as outlined below:    Consultants:  PCCM  Procedures:  Intubation  Antimicrobials:  Anti-infectives (From admission, onward)    Start     Dose/Rate Route Frequency Ordered Stop   10/04/21 1100  vancomycin (VANCOREADY) IVPB 1250 mg/250 mL  Status:  Discontinued        1,250 mg 166.7 mL/hr over 90 Minutes Intravenous Every 48 hours 10/02/21 0939 10/03/21 1508   10/02/21 2200  ceFEPIme (MAXIPIME) 2 g in sodium chloride 0.9 % 100 mL IVPB  Status:  Discontinued        2 g 200 mL/hr over 30 Minutes Intravenous Every 12 hours 10/02/21 0948 10/04/21 1010   10/02/21 1030  vancomycin (VANCOREADY) IVPB 2000 mg/400 mL        2,000 mg 200 mL/hr over 120 Minutes Intravenous  Once 10/02/21 0939 10/02/21 1324   09/27/21 0900  ceFEPIme (MAXIPIME) 2 g in sodium chloride 0.9 % 100 mL IVPB  Status:  Discontinued        2 g 200 mL/hr over 30 Minutes Intravenous Every 12 hours 09/27/21 0800 10/02/21 0948   09/27/21 0900  vancomycin (VANCOREADY) IVPB 1500 mg/300 mL  Status:  Discontinued        1,500 mg 150 mL/hr over 120 Minutes Intravenous  Every 48 hours 09/27/21 0800 09/28/21 1058   09/21/21 0800  cefTRIAXone (ROCEPHIN) 2 g in sodium chloride 0.9 % 100 mL IVPB        2 g 200 mL/hr over 30 Minutes Intravenous Every 24 hours 09/21/21 0742 09/25/21 0808   09/20/21 0945  cefTRIAXone (ROCEPHIN) 2 g in sodium chloride 0.9 % 100 mL IVPB  Status:  Discontinued        2 g 200 mL/hr over 30 Minutes Intravenous Every 24 hours 09/20/21 0936 09/21/21 0742          Subjective: Patient seen and examined.  Despite of being on track, she was very pleasant and alert and oriented and had no complaints.  Objective: Vitals:   10/08/21 1111 10/08/21 1123 10/08/21 1200 10/08/21 1300  BP:      Pulse: 77  75 83  Resp: 18  (!) 0 20  Temp:  98.7 F (37.1 C)    TempSrc:  Oral    SpO2: 94%  91% 92%  Weight:      Height:        Intake/Output Summary (Last 24 hours) at 10/08/2021 1350 Last data filed at 10/07/2021 2200 Gross per 24 hour  Intake 50 ml  Output 700 ml  Net -650 ml  Filed Weights   10/04/21 0238 10/06/21 0244 10/08/21 0400  Weight: 107.4 kg 103.2 kg 102.5 kg    Examination:  General exam: Appears calm and comfortable  Respiratory system: Clear to auscultation. Respiratory effort normal.  Trach in place. Cardiovascular system: S1 & S2 heard, RRR. No JVD, murmurs, rubs, gallops or clicks. No pedal edema. Gastrointestinal system: Abdomen is nondistended, soft and nontender. No organomegaly or masses felt. Normal bowel sounds heard. Central nervous system: Alert and oriented. No focal neurological deficits. Extremities: Symmetric 5 x 5 power. Skin: No rashes, lesions or ulcers Psychiatry: Judgement and insight appear normal. Mood & affect appropriate.    Data Reviewed: I have personally reviewed following labs and imaging studies  CBC: Recent Labs  Lab 10/04/21 0329 10/05/21 0316 10/05/21 0323 10/05/21 0937 10/06/21 0306 10/07/21 1149 10/08/21 0415  WBC 23.3*  --  19.0*  --  17.6* 20.0* 15.2*  HGB 9.9*    < > 10.0* 10.9* 9.6* 9.9* 8.8*  HCT 32.2*   < > 32.7* 32.0* 29.4* 32.0* 28.4*  MCV 93.3  --  94.0  --  92.5 93.8 93.7  PLT 266  --  243  --  221 227 182   < > = values in this interval not displayed.   Basic Metabolic Panel: Recent Labs  Lab 10/04/21 0329 10/05/21 0316 10/05/21 0323 10/05/21 0535 10/05/21 0937 10/05/21 1626 10/06/21 0053 10/06/21 0306 10/07/21 1149 10/08/21 0415  NA 136   < > 135  --  138  --   --  138 138 139  K 5.0   < > 5.8*   < > 5.8* 5.2* 4.3 4.3 4.4 4.0  CL 107  --  106  --   --   --   --  106 104 107  CO2 22  --  22  --   --   --   --  25 25 27   GLUCOSE 189*  --  303*  --   --   --   --  101* 233* 167*  BUN 58*  --  59*  --   --   --   --  56* 54* 53*  CREATININE 2.36*  --  2.24*  --   --   --   --  2.11* 2.11* 2.18*  CALCIUM 9.8  --  10.0  --   --   --   --  9.8 9.8 9.4  MG  --   --  2.0  --   --   --   --   --  1.9 1.8  PHOS  --   --  3.7  --   --   --   --   --  3.3 3.5   < > = values in this interval not displayed.   GFR: Estimated Creatinine Clearance: 35.7 mL/min (A) (by C-G formula based on SCr of 2.18 mg/dL (H)). Liver Function Tests: No results for input(s): AST, ALT, ALKPHOS, BILITOT, PROT, ALBUMIN in the last 168 hours. No results for input(s): LIPASE, AMYLASE in the last 168 hours. No results for input(s): AMMONIA in the last 168 hours. Coagulation Profile: No results for input(s): INR, PROTIME in the last 168 hours. Cardiac Enzymes: No results for input(s): CKTOTAL, CKMB, CKMBINDEX, TROPONINI in the last 168 hours. BNP (last 3 results) No results for input(s): PROBNP in the last 8760 hours. HbA1C: No results for input(s): HGBA1C in the last 72 hours. CBG: Recent Labs  Lab 10/07/21 1918 10/07/21 2331  10/08/21 0324 10/08/21 0726 10/08/21 1122  GLUCAP 255* 164* 146* 145* 209*   Lipid Profile: Recent Labs    10/06/21 0306  TRIG 967*   Thyroid Function Tests: No results for input(s): TSH, T4TOTAL, FREET4, T3FREE, THYROIDAB  in the last 72 hours. Anemia Panel: No results for input(s): VITAMINB12, FOLATE, FERRITIN, TIBC, IRON, RETICCTPCT in the last 72 hours. Sepsis Labs: No results for input(s): PROCALCITON, LATICACIDVEN in the last 168 hours.  Recent Results (from the past 240 hour(s))  Surgical PCR screen     Status: None   Collection Time: 10/01/21  8:26 AM   Specimen: Nasal Mucosa; Nasal Swab  Result Value Ref Range Status   MRSA, PCR NEGATIVE NEGATIVE Final   Staphylococcus aureus NEGATIVE NEGATIVE Final    Comment: (NOTE) The Xpert SA Assay (FDA approved for NASAL specimens in patients 72 years of age and older), is one component of a comprehensive surveillance program. It is not intended to diagnose infection nor to guide or monitor treatment. Performed at [facility], [place] [address], [place], [place] [address]   Culture, Respiratory w Gram Stain     Status: None   Collection Time: 10/01/21  4:20 PM   Specimen: Tracheal Aspirate  Result Value Ref Range Status   Specimen Description TRACHEAL ASPIRATE  Final   Special Requests NONE  Final   Gram Stain   Final    RARE SQUAMOUS EPITHELIAL CELLS PRESENT MODERATE WBC PRESENT,BOTH PMN AND MONONUCLEAR MODERATE GRAM POSITIVE COCCI FEW GRAM POSITIVE RODS FEW GRAM NEGATIVE RODS    Culture   Final    FEW Normal respiratory flora-no Staph aureus or Pseudomonas seen Performed at [facility], [place] [address], [place], [place] [address]    Report Status 10/03/2021 FINAL  Final      Radiology Studies: DG Abd Portable 1V  Result Date: 10/06/2021 CLINICAL DATA:  Feeding tube placement EXAM: PORTABLE ABDOMEN - 1 VIEW COMPARISON:  09/23/2021 FINDINGS: Tip of feeding tube is noted in the distal antrum of the stomach. Bowel gas pattern in the upper abdomen is unremarkable. Lower abdomen is not included in the study. IMPRESSION: Tip of feeding tube is seen in the antrum of the stomach. Reading location: [place], [place]. Electronically Signed    By: Elmer Picker M.D.   On: 10/06/2021 14:44   DG Swallowing Func-Speech Pathology  Result Date: 10/07/2021 Table formatting from the original result was not included. Objective Swallowing Evaluation: Type of Study: MBS-Modified Barium Swallow Study  Patient Details Name: Maureen Jones MRN: [id number] Date of Birth: 10-22-1966 Today's Date: 10/07/2021 Time: SLP Start Time (ACUTE ONLY): 72 -SLP Stop Time (ACUTE ONLY): 1245 SLP Time Calculation (min) (ACUTE ONLY): 15 min Past Medical History: Past Medical History: Diagnosis Date  Asthma   Diabetes ([place])   Glottic and subglottic edema 2022  Hypertension  Past Surgical History: No past surgical history on file. HPI: Pt is a 55 y/o female admitted with AMS 10/10. Stroke w/u negative, dx with acute metabolic encephalopathy in the setting of hypertensive emergency. ETT 10/10-10/12; reintubated 10/12 d/t increased WOB, not mobilizing secretions, stridor, received steroids, extubated 10/14 with lingering stridor, which was improving 10/15. ENT scoped pt 10/18: "Edema of the glottis and subglottic larynx following recent intubation and then repeat intubation.  There is no evidence of cord paralysis or any tumor present.  There is currently no stridor.  Allow more time for resolution of the swelling.  Unfortunately she is at risk for developing subglottic  stenosis." SLP followed pt 10/15-10/24. MBS 10/19: trace posterior spillage, mild pharyngeal delay, trace transient penetration; regular texture diet with thin liquids recommended and SLP ultimately signed off with pt demonstrating adequate tolerance. 10/25: Stridor again noted; pt intubated and trach placed. Cortrak placed 10/26.  Subjective: Pt seen in radiology. No family present Assessment / Plan / Recommendation CHL IP CLINICAL IMPRESSIONS 10/07/2021 Clinical Impression Pt was seen in radiology suite for MBS. Pt was lethargic and the impact of this on her performance is considered. Pt's accompanying RN  reported that the pt "has done a lot today". Pt tolerated PMSV intermittently during the study with stable vitals, but respiratory effort was increased by the end of the study. Pt presents with oropharyngeal dysphagia characterized by reduced bolus cohesion, reduced hyolaryngeal elevation, reduced anterior laryngeal movement, and a pharyngeal delay. Epiglottic inversion was incomplete and this appeared to be partly impacted by the Cortrak during intake of thin liquids. Trace vallecular residue was noted with solids and was cleared with a liquid wash. Penetration (PAS 3,5) was noted with thin liquids via cup and straw. Penetration was improved to PAS 2,3 with a chin tuck posture and eliminated with an effortful swallow. Prompted coughing was ineffective in expelling penetrated material. No definitive instances of aspiration were noted while the fluoro was on, but coughing was noted between swallows following repeated penetration events and aspiration is suspected. The study was completed with the PMSV donned and doffed with some reduction in laryngeal invasion noted when the PMSV was in place. A dysphagia 3 diet with nectar thick liquids will be initiated at this time. However, pt's prognosis for advancement is judged to be good and SLP will continue to follow pt. SLP Visit Diagnosis Dysphagia, oropharyngeal phase (R13.12) Attention and concentration deficit following -- Frontal lobe and executive function deficit following -- Impact on safety and function Mild aspiration risk   CHL IP TREATMENT RECOMMENDATION 10/07/2021 Treatment Recommendations Therapy as outlined in treatment plan below   Prognosis 10/07/2021 Prognosis for Safe Diet Advancement Good Barriers to Reach Goals -- Barriers/Prognosis Comment -- CHL IP DIET RECOMMENDATION 10/07/2021 SLP Diet Recommendations Dysphagia 3 (Mech soft) solids;Nectar thick liquid Liquid Administration via Cup;Straw Medication Administration Whole meds with liquid Compensations  Slow rate;Small sips/bites;Follow solids with liquid Postural Changes Seated upright at 90 degrees   CHL IP OTHER RECOMMENDATIONS 10/07/2021 Recommended Consults -- Oral Care Recommendations Oral care BID;Patient independent with oral care Other Recommendations --   CHL IP FOLLOW UP RECOMMENDATIONS 10/07/2021 Follow up Recommendations (No Data)   CHL IP FREQUENCY AND DURATION 10/07/2021 Speech Therapy Frequency (ACUTE ONLY) min 2x/week Treatment Duration 2 weeks      CHL IP ORAL PHASE 10/07/2021 Oral Phase Impaired Oral - Pudding Teaspoon -- Oral - Pudding Cup -- Oral - Honey Teaspoon -- Oral - Honey Cup -- Oral - Nectar Teaspoon -- Oral - Nectar Cup Decreased bolus cohesion;Premature spillage Oral - Nectar Straw Decreased bolus cohesion;Premature spillage Oral - Thin Teaspoon -- Oral - Thin Cup Decreased bolus cohesion;Premature spillage Oral - Thin Straw Decreased bolus cohesion;Premature spillage Oral - Puree Decreased bolus cohesion;Premature spillage Oral - Mech Soft NT Oral - Regular WFL Oral - Multi-Consistency -- Oral - Pill -- Oral Phase - Comment --  CHL IP PHARYNGEAL PHASE 10/07/2021 Pharyngeal Phase Impaired Pharyngeal- Pudding Teaspoon -- Pharyngeal -- Pharyngeal- Pudding Cup -- Pharyngeal -- Pharyngeal- Honey Teaspoon -- Pharyngeal -- Pharyngeal- Honey Cup -- Pharyngeal -- Pharyngeal- Nectar Teaspoon -- Pharyngeal -- Pharyngeal- Nectar Cup Reduced anterior laryngeal mobility;Reduced  laryngeal elevation;Reduced epiglottic inversion Pharyngeal -- Pharyngeal- Nectar Straw Reduced anterior laryngeal mobility;Reduced laryngeal elevation;Reduced epiglottic inversion Pharyngeal -- Pharyngeal- Thin Teaspoon -- Pharyngeal -- Pharyngeal- Thin Cup Reduced anterior laryngeal mobility;Reduced laryngeal elevation;Reduced epiglottic inversion;Penetration/Aspiration during swallow Pharyngeal Material enters airway, remains ABOVE vocal cords and not ejected out Pharyngeal- Thin Straw Reduced anterior laryngeal  mobility;Reduced laryngeal elevation;Reduced epiglottic inversion;Penetration/Aspiration during swallow;Penetration/Apiration after swallow Pharyngeal Material enters airway, CONTACTS cords and not ejected out;Material enters airway, remains ABOVE vocal cords and not ejected out Pharyngeal- Puree Reduced anterior laryngeal mobility;Reduced laryngeal elevation;Reduced epiglottic inversion;Pharyngeal residue - valleculae Pharyngeal -- Pharyngeal- Mechanical Soft -- Pharyngeal -- Pharyngeal- Regular Reduced anterior laryngeal mobility;Reduced laryngeal elevation;Reduced epiglottic inversion;Pharyngeal residue - valleculae Pharyngeal -- Pharyngeal- Multi-consistency -- Pharyngeal -- Pharyngeal- Pill Reduced anterior laryngeal mobility;Reduced laryngeal elevation;Reduced epiglottic inversion Pharyngeal -- Pharyngeal Comment --  CHL IP CERVICAL ESOPHAGEAL PHASE 10/07/2021 Cervical Esophageal Phase WFL Pudding Teaspoon -- Pudding Cup -- Honey Teaspoon -- Honey Cup -- Nectar Teaspoon -- Nectar Cup -- Nectar Straw -- Thin Teaspoon -- Thin Cup -- Thin Straw -- Puree -- Mechanical Soft -- Regular -- Multi-consistency -- Pill -- Cervical Esophageal Comment -- Shanika I. Hardin Negus, [place], [place] Office number [phone] Pager [place] 10/07/2021, 3:02 PM               Scheduled Meds:  amLODipine  10 mg Per Tube Daily   arformoterol  15 mcg Nebulization BID   budesonide (PULMICORT) nebulizer solution  0.25 mg Nebulization BID   carvedilol  50 mg Per Tube BID WC   chlorhexidine  15 mL Mouth Rinse BID   Chlorhexidine Gluconate Cloth  6 each Topical Daily   docusate  100 mg Per Tube BID   feeding supplement (PROSource TF)  45 mL Per Tube BID   FLUoxetine  10 mg Per Tube Daily   fluticasone  2 spray Each Nare Daily   heparin  5,000 Units Subcutaneous Q8H   hydrALAZINE  100 mg Per Tube Q8H   insulin aspart  0-15 Units Subcutaneous Q4H   insulin detemir  35 Units  Subcutaneous BID   loratadine  10 mg Per Tube Daily   mouth rinse  15 mL Mouth Rinse q12n4p   montelukast  10 mg Per Tube QHS   pantoprazole sodium  40 mg Per Tube Daily   polyethylene glycol  17 g Per Tube Daily   [START ON 10/12/2021] predniSONE  10 mg Per Tube Q breakfast   [START ON 10/09/2021] predniSONE  20 mg Per Tube Q breakfast   QUEtiapine  100 mg Per Tube QHS   sodium chloride flush  10-40 mL Intracatheter Q12H   Continuous Infusions:  feeding supplement (VITAL AF 1.2 CAL) 1,000 mL (10/07/21 2115)     LOS: 18 days   Time spent: 36 minutes   Darliss Cheney, MD [facility]  10/08/2021, 1:50 PM  Please page via Amion and do not message via secure chat for anything urgent. Secure chat can be used for anything non urgent.  How to contact the [place] Attending or Consulting provider [place] or covering provider during after hours [place], for this patient?  Check the care team in [facility] and look for a) attending/consulting TR[facility] provider listed and b) the [facility] team listed. Page or secure chat [phone]. Log into [email or web address] and use [place]'s universal password to access. If you do not have the password, please contact the hospital operator. Locate the [facility] provider you are looking  for under [facility] and page to a number that you can be directly reached. If you still have difficulty reaching the provider, please page the [facility] (Director on Call) for the Hospitalists listed on amion for assistance.

## 2021-10-08 NOTE — Progress Notes (Signed)
Nutrition Follow-up  DOCUMENTATION CODES:   Obesity unspecified  INTERVENTION:  -Transition pt to PRN bolus TF via Cortrak If pt eats <50% of meal, provide bolus of 1.5 cartons (32ml) Osmolite 1.5 via Cortrak tube (Flush with 33ml free water before and after each bolus)  Each 34ml bolus of Osmolite 1.5 provides 532 kcals, 22 grams protein, 246ml free water  -Continue 44ml Prosource TF BID per tube  -Magic cup po BID with meals, each supplement provides 290 kcal and 9 grams of protein -Ensure Enlive po BID, each supplement provides 350 kcal and 20 grams of protein (thickened to nectar thick consistency per diet order/SLP)  NUTRITION DIAGNOSIS:  Inadequate oral intake related to inability to eat as evidenced by NPO status. Progressing, pt on dysphagia 3 diet with nectar thick liquids  GOAL:  Patient will meet greater than or equal to 90% of their needs progressing  MONITOR:   PO intake, I & O's, Weight trends, Supplement acceptance  REASON FOR ASSESSMENT:   Consult, Ventilator Enteral/tube feeding initiation and management  ASSESSMENT:   Pt with PMH of DM, HTN, and smoking admitted with acute encephalopathy.  10/10 - admit, intubated 10/12 - extubated and re-intubated 10/14 - extubated  10/15 - dysphagia 3 diet s/p swallow eval 10/22 - returned to BiPAP 10/25 - re-intubated, trach 10/26 - Cortrak placed (gastric tip confirmed via xray)  Discussed pt with RN and SLP. Now that pt's diet has advanced, NP and SLP are wanting to reduce TF to see how pt does with PO diet. Additionally, pt c/o sinus congestion and nasal discomfort so staff is hopeful pt's intake will be adequate enough to remove Cortrak. Per CCM, pt is tolerating PMV and was able to eat breakfast without difficulty. Pt endorses feeling of fullness at this time which is likely 2/2 having been on continuous TF. RN stopped TF to stimulate appetite. Discussed plan with RN to transition pt to bolus TF with  instructions to only give boluses if pt eats <50% of her meals. If pt does well with this and does not need any/many boluses, may remove Cortrak in 24-36 hours. RN and SLP in agreement with plan.   No PO intake documented since 10/23. Discussed consistent meal documentation with RN so that decision regarding Cortrak can be made.   Current TF via Cortrak: Vital AF 1.2 @ 56ml/hr w/ 28ml Prosource TF BID  Medications: colace, levemir, protonix, miralax, deltasone Labs: BUN 53 (H), Cr 2.18 (H) CBGs: [phone]  UOP: 721ml x24 hours I/O: -1785ml since admit  Admit weight: 100 kg Current weight: 102.5 kg  Diet Order:   Diet Order             DIET DYS 3 Room service appropriate? Yes with Assist; Fluid consistency: Nectar Thick  Diet effective now                   EDUCATION NEEDS:   Not appropriate for education at this time  Skin:  Skin Assessment: Reviewed RN Assessment  Last BM:  10/27  Height:   Ht Readings from Last 1 Encounters:  10/05/21 5\' 6"  (1.676 m)    Weight:   Wt Readings from Last 1 Encounters:  10/08/21 102.5 kg    Ideal Body Weight:  59.1 kg  BMI:  Body mass index is 36.47 kg/m.  Estimated Nutritional Needs:   Kcal:  1900-2100  Protein:  >/= 115 gm  Fluid:  >2L    Larkin Ina, MS, RD, LDN (she/her/hers) RD  pager number and weekend/on-call pager number located in [place].

## 2021-10-08 NOTE — Progress Notes (Signed)
Physical Therapy Treatment Patient Details Name: Maureen Jones MRN: [id number] DOB: 1966/09/23 Today's Date: 10/08/2021   History of Present Illness 55 y.o. female admitted 09/20/21 with AMS. 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 CXR with low lung volumes, L>R interstitial opacities. Acute metabolic encephalopathy in setting of HTN urgency, possible PRES. ETT (10/10-10/12, 10/12-10/14). Course complicated by confusion/agitation overnight 10/17. 10/25 trach and bronch with return to vent. PMHx:HTN, DM.    PT Comments    Pt pleasant with PMSV on throughout session and SPO2 >93% on 35% FiO2 trach collar. Pt with increased gait tolerance and HEP. Pt encouraged to be OOB for meals and toileting. D/C plan remains appropriate and will continue to follow.  HR 86-110 with gait    Recommendations for follow up therapy are one component of a multi-disciplinary discharge planning process, led by the attending physician.  Recommendations may be updated based on patient status, additional functional criteria and insurance authorization.  Follow Up Recommendations  Home health PT     Assistance Recommended at Discharge Intermittent Supervision/Assistance  Equipment Recommendations  Rolling walker (2 wheels);3in1 (PT)    Recommendations for Other Services       Precautions / Restrictions Precautions Precautions: Fall;Other (comment) Precaution Comments: trach, cortrak, picc Restrictions Weight Bearing Restrictions: No     Mobility  Bed Mobility Overal bed mobility: Needs Assistance Bed Mobility: Supine to Sit     Supine to sit: Min guard;HOB elevated     General bed mobility comments: HOB 30 degrees with guarding for lines and safety    Transfers Overall transfer level: Needs assistance   Transfers: Sit to/from Stand Sit to Stand: Min guard           General transfer comment: guarding for lines and safety with cues for hand placement     Ambulation/Gait Ambulation/Gait assistance: Min guard Gait Distance (Feet): 150 Feet Assistive device: Rolling walker (2 wheels) Gait Pattern/deviations: Step-through pattern;Decreased stride length   Gait velocity interpretation: <1.8 ft/sec, indicate of risk for recurrent falls General Gait Details: 3 standing rest breaks with pt with sats >93% on 35% trach collar with PMSV throughout gait. pt reported fatigue and difficulty breathing with PMSV removed and will plan to attempt gait without PMSV next session for comparison of fatigue and SOB   Stairs             Wheelchair Mobility    Modified Rankin (Stroke Patients Only)       Balance Overall balance assessment: Needs assistance   Sitting balance-[name]: Good Sitting balance - Comments: EOb without assist   Standing balance support: Bilateral upper extremity supported Standing balance-[name]: Poor Standing balance comment: RW for gait                            Cognition Arousal/Alertness: Awake/alert Behavior During Therapy: WFL for tasks assessed/performed Overall Cognitive Status: Within Functional Limits for tasks assessed                                          Exercises General Exercises - Lower Extremity Long Arc Quad: AROM;Seated;20 reps;Both Hip Flexion/Marching: AROM;Seated;Both;20 reps    General Comments        Pertinent Vitals/Pain Pain Assessment: No/denies pain Faces Pain Scale: Hurts a little bit Pain Location: neck/throat Pain Descriptors / Indicators: Tender    Home Living  Prior Function            PT Goals (current goals can now be found in the care plan section) Progress towards PT goals: Progressing toward goals    Frequency    Min 3X/week      PT Plan Current plan remains appropriate    Co-evaluation              AM-PAC PT "6 Clicks" Mobility   Outcome Measure  Help needed  turning from your back to your side while in a flat bed without using bedrails?: A Little Help needed moving from lying on your back to sitting on the side of a flat bed without using bedrails?: A Little Help needed moving to and from a bed to a chair (including a wheelchair)?: A Little Help needed standing up from a chair using your arms (e.g., wheelchair or bedside chair)?: A Little Help needed to walk in hospital room?: A Little Help needed climbing 3-5 steps with a railing? : A Lot 6 Click Score: 17    End of Session Equipment Utilized During Treatment: Oxygen Activity Tolerance: Patient tolerated treatment well Patient left: in chair;with call bell/phone within reach;with chair alarm set Nurse Communication: Mobility status PT Visit Diagnosis: Unsteadiness on feet (R26.81);Other abnormalities of gait and mobility (R26.89);Difficulty in walking, not elsewhere classified (R26.2)     Time: 2956-2130 PT Time Calculation (min) (ACUTE ONLY): 26 min  Charges:  $Gait Training: 8-22 mins $Therapeutic Exercise: 8-22 mins                     [place], PT Acute Rehabilitation Services Pager: [phone] Office: [place] 10/08/2021, 11:09 AM

## 2021-10-09 DIAGNOSIS — J9600 Acute respiratory failure, unspecified whether with hypoxia or hypercapnia: Secondary | ICD-10-CM | POA: Diagnosis not present

## 2021-10-09 DIAGNOSIS — Z978 Presence of other specified devices: Secondary | ICD-10-CM | POA: Diagnosis not present

## 2021-10-09 LAB — GLUCOSE, CAPILLARY
Glucose-Capillary: 118 mg/dL — ABNORMAL HIGH (ref 70–99)
Glucose-Capillary: 135 mg/dL — ABNORMAL HIGH (ref 70–99)
Glucose-Capillary: 166 mg/dL — ABNORMAL HIGH (ref 70–99)
Glucose-Capillary: 196 mg/dL — ABNORMAL HIGH (ref 70–99)
Glucose-Capillary: 238 mg/dL — ABNORMAL HIGH (ref 70–99)
Glucose-Capillary: 301 mg/dL — ABNORMAL HIGH (ref 70–99)

## 2021-10-09 LAB — BASIC METABOLIC PANEL
Anion gap: 7 (ref 5–15)
BUN: 49 mg/dL — ABNORMAL HIGH (ref 6–20)
CO2: 27 mmol/L (ref 22–32)
Calcium: 9.9 mg/dL (ref 8.9–10.3)
Chloride: 107 mmol/L (ref 98–111)
Creatinine, Ser: 2.18 mg/dL — ABNORMAL HIGH (ref 0.44–1.00)
GFR, Estimated: 26 mL/min — ABNORMAL LOW (ref >60)
Glucose, Bld: 151 mg/dL — ABNORMAL HIGH (ref 70–99)
Potassium: 4.5 mmol/L (ref 3.5–5.1)
Sodium: 141 mmol/L (ref 135–145)

## 2021-10-09 MED ORDER — INSULIN DETEMIR 100 UNIT/ML ~~LOC~~ SOLN
40.0000 [IU] | Freq: Two times a day (BID) | SUBCUTANEOUS | Status: DC
  Administered 2021-10-09 – 2021-10-10 (×4): 40 [IU] via SUBCUTANEOUS
  Filled 2021-10-09 (×6): qty 0.4

## 2021-10-09 NOTE — Progress Notes (Signed)
PROGRESS NOTE    Maureen Jones  SWF:[id number] DOB: 01-May-1966 DOA: 09/20/2021 PCP: Pcp, No   Brief Narrative:  55 y/o F with PMH HTN, DM who presented to [facility] on 10/10 with reports of acute altered mental status. HTN Emergency.  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 She was hypertensive with initial pressure of 240/112. Intubated.  10/12 extubated, increased WOB, re-intubated. 10/14 extubated, lingering stridor, Received racemic epi x2 + another dose of Decadron, Mild agitation requiring restart of Precedex. Did not require re-intubation, however stay complicated by continued stridor. 10/18 ENT scope > showed edema of the glottis and subglottic larynx. Continue on high dose steroids. 10/25 stridor and respiratory distress, requiring intubation and tracheostomy. Now on [employer]. PT/OT/ST following. Pulmonary will continue to follow for trach management.    Significant Hospital Events: Including procedures, antibiotic start and stop dates in addition to other pertinent events   10/10 Admit with acute encephalopathy, hypertensive 10/12 extubated, increased WOB, re-intubated 10/14 extubated , lingering stridor, Received racemic epi x2 over the last 24 hours + another dose of Decadron, Mild agitation requiring restart of Precedex 10/15 swallow evaluation >> dysphagia 3 diet 10/20 decompensated after switch to PO steroids 10/21 better after CAT and back on IV steroids 10/22 agitated back on  BIPAP 10/25 stridor again. Marked work of breathing. Intubated, airway noises, stridor and wheezing completely resolved post intubation. Spoke to ENT who will follow after trach. Trach placed by Dr Vaughan Browner. Seroquel increased to 100 qhs, changes solumedrol to prednisone to begin taper, rested on propofol over night. Tubefeeds started. RUE PICC placed  10/26 prop weaning off attempting ATC     Assessment & Plan:   Active Problems:    Encephalopathy acute   Endotracheal tube present   Acute respiratory failure (HCC)   Subglottic edema   Vocal cord dysfunction  Acute respiratory failure with hypoxia 2/2 mixed picture of VCD/post intubation subglottic and glottic edema/vocal hoarseness  +/- asthmatic exacerbation: She was Intubated upon presentation,  10/12 extubated, increased WOB, re-intubated. 10/14 extubated, lingering stridor, Received racemic epi x2 + another dose of Decadron.  Seen by ENT, had epiglottic and subglottic edema, started on tapering dose of prednisone.  Now has trach.  We will continue steroid taper dose, continue Claritin, Singulair, BDs and ICS and management per pulmonology.   Hypertensive urgency:  Initial BP 240/112, MAP 136.  Echo with moderate LVH.  Blood pressure only borderline high.  We will continue hydralazine, amlodipine and current dose of Coreg and watch for another day.  Consider adding Imdur as next day.  Acute Metabolic Encephalopathy due to hypertensive emergency. C/b anxiety Imaging negative for PRES. CT head negative. MRI negative. Has had sundowning at night this admission, even nights without respiratory distress, this morning, she is fully alert and oriented.  -Cont Seroquel at 100 qhs (increased 10/25) -Cont prozac (added 10/22) -Taper steroids -BID Clonazepam added 10/22 > Change to 0.5 mg PRN   CKD stage IV: Please note that previous all progress notes have indicated that patient had AKI however after chart review, it appears that since admission, patient's creatinine has remained between 2-2 0.3 with a GFR that falls in CKD stage IV, there is no previous lab available to compare creatinine with.  I believe this is her CKD stage IV.  This is at baseline.   DM with uncontrolled hyperglycemia: Slightly elevated blood sugar, increase Levemir further to 40 units twice daily and  continue SSI.  Acute anemia due to critical illness: Hemoglobin stable around 9, transfuse if less than  7.  DVT prophylaxis: heparin injection 5,000 Units Start: 09/20/21 0845 SCDs Start: 09/20/21 0831   Code Status: Full Code  Family Communication:  None present at bedside.  Plan of care discussed with patient in length and he verbalized understanding and agreed with it.  Status is: Inpatient  Remains inpatient appropriate because: Needs further work-up.  Medical management.  Estimated body mass index is 36.47 kg/m as calculated from the following:   Height as of this encounter: 5\' 6"  (1.676 m).   Weight as of this encounter: 102.5 kg.     Nutritional Assessment: Body mass index is 36.47 kg/m.Marland Kitchen Seen by dietician.  I agree with the assessment and plan as outlined below: Nutrition Status: Nutrition Problem: Inadequate oral intake Etiology: inability to eat Signs/Symptoms: NPO status Interventions: Ensure Enlive (each supplement provides 350kcal and 20 grams of protein), MVI  .  Skin Assessment: I have examined the patient's skin and I agree with the wound assessment as performed by the wound care RN as outlined below:    Consultants:  PCCM  Procedures:  Intubation  Antimicrobials:  Anti-infectives (From admission, onward)    Start     Dose/Rate Route Frequency Ordered Stop   10/04/21 1100  vancomycin (VANCOREADY) IVPB 1250 mg/250 mL  Status:  Discontinued        1,250 mg 166.7 mL/hr over 90 Minutes Intravenous Every 48 hours 10/02/21 0939 10/03/21 1508   10/02/21 2200  ceFEPIme (MAXIPIME) 2 g in sodium chloride 0.9 % 100 mL IVPB  Status:  Discontinued        2 g 200 mL/hr over 30 Minutes Intravenous Every 12 hours 10/02/21 0948 10/04/21 1010   10/02/21 1030  vancomycin (VANCOREADY) IVPB 2000 mg/400 mL        2,000 mg 200 mL/hr over 120 Minutes Intravenous  Once 10/02/21 0939 10/02/21 1324   09/27/21 0900  ceFEPIme (MAXIPIME) 2 g in sodium chloride 0.9 % 100 mL IVPB  Status:  Discontinued        2 g 200 mL/hr over 30 Minutes Intravenous Every 12 hours 09/27/21 0800  10/02/21 0948   09/27/21 0900  vancomycin (VANCOREADY) IVPB 1500 mg/300 mL  Status:  Discontinued        1,500 mg 150 mL/hr over 120 Minutes Intravenous Every 48 hours 09/27/21 0800 09/28/21 1058   09/21/21 0800  cefTRIAXone (ROCEPHIN) 2 g in sodium chloride 0.9 % 100 mL IVPB        2 g 200 mL/hr over 30 Minutes Intravenous Every 24 hours 09/21/21 0742 09/25/21 0808   09/20/21 0945  cefTRIAXone (ROCEPHIN) 2 g in sodium chloride 0.9 % 100 mL IVPB  Status:  Discontinued        2 g 200 mL/hr over 30 Minutes Intravenous Every 24 hours 09/20/21 0936 09/21/21 0742          Subjective:  Patient seen and examined.  She has no complaints.  Objective: Vitals:   10/09/21 0545 10/09/21 0732 10/09/21 0801 10/09/21 1128  BP:   (!) 164/84   Pulse: 86 84 81 85  Resp: (!) 21 20 (!) 26 20  Temp:      TempSrc:      SpO2: 94% 96% 95% 100%  Weight:      Height:        Intake/Output Summary (Last 24 hours) at 10/09/2021 1353 Last data filed at 10/08/2021 1655 Gross  per 24 hour  Intake --  Output 450 ml  Net -450 ml    Filed Weights   10/06/21 0244 10/08/21 0400 10/09/21 0500  Weight: 103.2 kg 102.5 kg 102.5 kg    Examination:  General exam: Appears calm and comfortable, trach in place. Respiratory system: Clear to auscultation. Respiratory effort normal. Cardiovascular system: S1 & S2 heard, RRR. No JVD, murmurs, rubs, gallops or clicks. No pedal edema. Gastrointestinal system: Abdomen is nondistended, soft and nontender. No organomegaly or masses felt. Normal bowel sounds heard. Central nervous system: Alert and oriented. No focal neurological deficits. Extremities: Symmetric 5 x 5 power. Skin: No rashes, lesions or ulcers.    Data Reviewed: I have personally reviewed following labs and imaging studies  CBC: Recent Labs  Lab 10/04/21 0329 10/05/21 0316 10/05/21 0323 10/05/21 0937 10/06/21 0306 10/07/21 1149 10/08/21 0415  WBC 23.3*  --  19.0*  --  17.6* 20.0* 15.2*   HGB 9.9*   < > 10.0* 10.9* 9.6* 9.9* 8.8*  HCT 32.2*   < > 32.7* 32.0* 29.4* 32.0* 28.4*  MCV 93.3  --  94.0  --  92.5 93.8 93.7  PLT 266  --  243  --  221 227 182   < > = values in this interval not displayed.    Basic Metabolic Panel: Recent Labs  Lab 10/05/21 0323 10/05/21 0535 10/05/21 0937 10/05/21 1626 10/06/21 0053 10/06/21 0306 10/07/21 1149 10/08/21 0415 10/09/21 0356  NA 135  --  138  --   --  138 138 139 141  K 5.8*   < > 5.8*   < > 4.3 4.3 4.4 4.0 4.5  CL 106  --   --   --   --  106 104 107 107  CO2 22  --   --   --   --  25 25 27 27   GLUCOSE 303*  --   --   --   --  101* 233* 167* 151*  BUN 59*  --   --   --   --  56* 54* 53* 49*  CREATININE 2.24*  --   --   --   --  2.11* 2.11* 2.18* 2.18*  CALCIUM 10.0  --   --   --   --  9.8 9.8 9.4 9.9  MG 2.0  --   --   --   --   --  1.9 1.8  --   PHOS 3.7  --   --   --   --   --  3.3 3.5  --    < > = values in this interval not displayed.    GFR: Estimated Creatinine Clearance: 35.7 mL/min (A) (by C-G formula based on SCr of 2.18 mg/dL (H)). Liver Function Tests: No results for input(s): AST, ALT, ALKPHOS, BILITOT, PROT, ALBUMIN in the last 168 hours. No results for input(s): LIPASE, AMYLASE in the last 168 hours. No results for input(s): AMMONIA in the last 168 hours. Coagulation Profile: No results for input(s): INR, PROTIME in the last 168 hours. Cardiac Enzymes: No results for input(s): CKTOTAL, CKMB, CKMBINDEX, TROPONINI in the last 168 hours. BNP (last 3 results) No results for input(s): PROBNP in the last 8760 hours. HbA1C: No results for input(s): HGBA1C in the last 72 hours. CBG: Recent Labs  Lab 10/08/21 1603 10/09/21 0030 10/09/21 0705 10/09/21 0913 10/09/21 1156  GLUCAP 306* 238* 118* 196* 166*    Lipid Profile: No results for input(s): CHOL, HDL,  LDLCALC, TRIG, CHOLHDL, LDLDIRECT in the last 72 hours.  Thyroid Function Tests: No results for input(s): TSH, T4TOTAL, FREET4, T3FREE, THYROIDAB  in the last 72 hours. Anemia Panel: No results for input(s): VITAMINB12, FOLATE, FERRITIN, TIBC, IRON, RETICCTPCT in the last 72 hours. Sepsis Labs: No results for input(s): PROCALCITON, LATICACIDVEN in the last 168 hours.  Recent Results (from the past 240 hour(s))  Surgical PCR screen     Status: None   Collection Time: 10/01/21  8:26 AM   Specimen: Nasal Mucosa; Nasal Swab  Result Value Ref Range Status   MRSA, PCR NEGATIVE NEGATIVE Final   Staphylococcus aureus NEGATIVE NEGATIVE Final    Comment: (NOTE) The Xpert SA Assay (FDA approved for NASAL specimens in patients 57 years of age and older), is one component of a comprehensive surveillance program. It is not intended to diagnose infection nor to guide or monitor treatment. Performed at [facility], [place] [address] [place], [place] [address]   Culture, Respiratory w Gram Stain     Status: None   Collection Time: 10/01/21  4:20 PM   Specimen: Tracheal Aspirate  Result Value Ref Range Status   Specimen Description TRACHEAL ASPIRATE  Final   Special Requests NONE  Final   Gram Stain   Final    RARE SQUAMOUS EPITHELIAL CELLS PRESENT MODERATE WBC PRESENT,BOTH PMN AND MONONUCLEAR MODERATE GRAM POSITIVE COCCI FEW GRAM POSITIVE RODS FEW GRAM NEGATIVE RODS    Culture   Final    FEW Normal respiratory flora-no Staph aureus or Pseudomonas seen Performed at [facility], [place] [address], [place], [place] [address]    Report Status 10/03/2021 FINAL  Final  Culture, Respiratory w Gram Stain     Status: None (Preliminary result)   Collection Time: 10/08/21  3:30 PM   Specimen: Sputum; Respiratory  Result Value Ref Range Status   Specimen Description SPU  Final   Special Requests NONE  Final   Gram Stain   Final    NO ORGANISMS SEEN SQUAMOUS EPITHELIAL CELLS PRESENT MODERATE WBC PRESENT,BOTH PMN AND MONONUCLEAR FEW GRAM NEGATIVE RODS RARE GRAM POSITIVE COCCI    Culture   Final    CULTURE REINCUBATED FOR BETTER  GROWTH Performed at [facility], [place] [address], [place], [place] [address]    Report Status PENDING  Incomplete       Radiology Studies: No results found.  Scheduled Meds:  amLODipine  10 mg Per Tube Daily   arformoterol  15 mcg Nebulization BID   budesonide (PULMICORT) nebulizer solution  0.25 mg Nebulization BID   carvedilol  50 mg Per Tube BID WC   chlorhexidine  15 mL Mouth Rinse BID   Chlorhexidine Gluconate Cloth  6 each Topical Daily   docusate  100 mg Per Tube BID   feeding supplement  237 mL Oral BID BM   feeding supplement (PROSource TF)  45 mL Per Tube BID   FLUoxetine  10 mg Per Tube Daily   fluticasone  2 spray Each Nare Daily   heparin  5,000 Units Subcutaneous Q8H   hydrALAZINE  100 mg Per Tube Q8H   insulin aspart  0-15 Units Subcutaneous Q4H   insulin detemir  40 Units Subcutaneous BID   loratadine  10 mg Per Tube Daily   mouth rinse  15 mL Mouth Rinse q12n4p   montelukast  10 mg Per Tube QHS   pantoprazole sodium  40 mg Per Tube Daily   polyethylene glycol  17 g Per Tube Daily   [START ON 10/12/2021] predniSONE  10 mg Per Tube Q breakfast   predniSONE  20 mg Per Tube Q breakfast   QUEtiapine  100 mg Per Tube QHS   sodium chloride flush  10-40 mL Intracatheter Q12H   Continuous Infusions:     LOS: 19 days   Time spent: 30 minutes   Darliss Cheney, MD [facility]  10/09/2021, 1:53 PM  Please page via [place] and do not message via secure chat for anything urgent. Secure chat can be used for anything non urgent.  How to contact the [facility] Attending or Consulting provider [place] or covering provider during after hours [place], for this patient?  Check the care team in [facility] and look for a) attending/consulting [facility] provider listed and b) the [facility] team listed. Page or secure chat [phone]. Log into [email or web address] and use [place]'s universal password to access. If you do not have the password, please contact the hospital operator. Locate the [facility]  provider you are looking for under [facility] and page to a number that you can be directly reached. If you still have difficulty reaching the provider, please page the [facility] (Director on Call) for the Hospitalists listed on amion for assistance.

## 2021-10-10 DIAGNOSIS — Z978 Presence of other specified devices: Secondary | ICD-10-CM | POA: Diagnosis not present

## 2021-10-10 DIAGNOSIS — J9600 Acute respiratory failure, unspecified whether with hypoxia or hypercapnia: Secondary | ICD-10-CM | POA: Diagnosis not present

## 2021-10-10 LAB — GLUCOSE, CAPILLARY
Glucose-Capillary: 111 mg/dL — ABNORMAL HIGH (ref 70–99)
Glucose-Capillary: 131 mg/dL — ABNORMAL HIGH (ref 70–99)
Glucose-Capillary: 214 mg/dL — ABNORMAL HIGH (ref 70–99)
Glucose-Capillary: 218 mg/dL — ABNORMAL HIGH (ref 70–99)
Glucose-Capillary: 242 mg/dL — ABNORMAL HIGH (ref 70–99)
Glucose-Capillary: 377 mg/dL — ABNORMAL HIGH (ref 70–99)

## 2021-10-10 LAB — CULTURE, RESPIRATORY W GRAM STAIN: Culture: NORMAL

## 2021-10-10 MED ORDER — ISOSORBIDE MONONITRATE ER 30 MG PO TB24
15.0000 mg | ORAL_TABLET | Freq: Every day | ORAL | Status: DC
  Administered 2021-10-10 – 2021-10-14 (×5): 15 mg via ORAL
  Filled 2021-10-10 (×7): qty 1

## 2021-10-10 NOTE — Progress Notes (Signed)
Speech Language Pathology Treatment: Dysphagia;Passy Muir Speaking valve  Patient Details Name: Maureen Jones MRN: [id number] DOB: November 01, 1966 Today's Date: 10/10/2021 Time: 3094-0768 SLP Time Calculation (min) (ACUTE ONLY): 24 min  Assessment / Plan / Recommendation Clinical Impression  Pt was seen for treatment. She was alert and cooperative during the session. Pt stated that she is beginning to dislike the nectar thick liquids and would like to have regular water. Pt was able to maintain an adequate level of alertness throughout the session. PMSV was in place upon SLP's entry and she stated that she has been using it without difficulty. She tolerated PMSV for the duration of the session with stable vitals and no evidence of air trapping.  She was educated regarding PMSV care, precautions, and donning/doffing. She verbalized understanding and demonstrated donning and doffing twice with verbal prompts for finger placement. Pt tolerated regular texture solids and thin liquids without overt s/sx of aspiration. She was educated on use of an effortful swallow and she was able to demonstrate this without difficulty. Mastication and oral clearance were WFL. Pt's diet will be advanced to regular texture solids and thin liquids with observance of swallowing precautions as listed below. SLP will continue to follow pt.    PMSV was placed for: 25 Able to redirect subglottic air through upper airway: Yes Able to Attain Phonation: Yes Voice Quality:  (rough) Able to Expectorate Secretions: No attempts Breath Support for Phonation: Mildly decreased Intelligibility: Intelligible Respirations During Trial:  (24) SpO2 During Trial:  (100) Behavior: Alert;Cooperative;Expresses self well;Good eye contact;Responsive to questions      HPI HPI: Pt is a 55 y/o female admitted with AMS 10/10. Stroke w/u negative, dx with acute metabolic encephalopathy in the setting of hypertensive emergency. ETT 10/10-10/12;  reintubated 10/12 d/t increased WOB, not mobilizing secretions, stridor, received steroids, extubated 10/14 with lingering stridor, which was improving 10/15. ENT scoped pt 10/18: "Edema of the glottis and subglottic larynx following recent intubation and then repeat intubation.  There is no evidence of cord paralysis or any tumor present.  There is currently no stridor.  Allow more time for resolution of the swelling.  Unfortunately she is at risk for developing subglottic stenosis." SLP followed pt 10/15-10/24. MBS 10/19: trace posterior spillage, mild pharyngeal delay, trace transient penetration; regular texture diet with thin liquids recommended and SLP ultimately signed off with pt demonstrating adequate tolerance. 10/25: Stridor again noted; pt intubated and trach placed. Cortrak placed 10/26.      SLP Plan  Continue with current plan of care      Recommendations for follow up therapy are one component of a multi-disciplinary discharge planning process, led by the attending physician.  Recommendations may be updated based on patient status, additional functional criteria and insurance authorization.    Recommendations  Diet recommendations: Regular;Thin liquid Liquids provided via: Cup;Straw Medication Administration: Whole meds with puree Supervision: Patient able to self feed Compensations: Slow rate;Small sips/bites;Follow solids with liquid      Patient may use Passy-Muir Speech Valve: During all waking hours (remove during sleep);During PO intake/meals PMSV Supervision: Intermittent MD: Please consider changing trach tube to : Cuffless         Oral Care Recommendations: Oral care BID Follow up Recommendations:  (TBD) SLP Visit Diagnosis: Dysphagia, oropharyngeal phase (R13.12) Plan: Continue with current plan of care       [name], [place], [place] Office number [phone] Pager [phone]  [name]  10/10/2021, 4:50 PM

## 2021-10-10 NOTE — Progress Notes (Signed)
PROGRESS NOTE    Maureen Jones  QPR:[id number] DOB: Oct 24, 1966 DOA: 09/20/2021 PCP: Pcp, No   Brief Narrative:  55 y/o F with PMH HTN, DM who presented to [facility] on 10/10 with reports of acute altered mental status. HTN Emergency.  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 She was hypertensive with initial pressure of 240/112. Intubated.  10/12 extubated, increased WOB, re-intubated. 10/14 extubated, lingering stridor, Received racemic epi x2 + another dose of Decadron, Mild agitation requiring restart of Precedex. Did not require re-intubation, however stay complicated by continued stridor. 10/18 ENT scope > showed edema of the glottis and subglottic larynx. Continue on high dose steroids. 10/25 stridor and respiratory distress, requiring intubation and tracheostomy. Now on [employer]. PT/OT/ST following. Pulmonary will continue to follow for trach management.    Significant Hospital Events: Including procedures, antibiotic start and stop dates in addition to other pertinent events   10/10 Admit with acute encephalopathy, hypertensive 10/12 extubated, increased WOB, re-intubated 10/14 extubated , lingering stridor, Received racemic epi x2 over the last 24 hours + another dose of Decadron, Mild agitation requiring restart of Precedex 10/15 swallow evaluation >> dysphagia 3 diet 10/20 decompensated after switch to PO steroids 10/21 better after CAT and back on IV steroids 10/22 agitated back on  BIPAP 10/25 stridor again. Marked work of breathing. Intubated, airway noises, stridor and wheezing completely resolved post intubation. Spoke to ENT who will follow after trach. Trach placed by Dr Vaughan Browner. Seroquel increased to 100 qhs, changes solumedrol to prednisone to begin taper, rested on propofol over night. Tubefeeds started. RUE PICC placed  10/26 prop weaning off attempting ATC     Assessment & Plan:   Active Problems:    Encephalopathy acute   Endotracheal tube present   Acute respiratory failure (HCC)   Subglottic edema   Vocal cord dysfunction  Acute respiratory failure with hypoxia 2/2 mixed picture of VCD/post intubation subglottic and glottic edema/vocal hoarseness  +/- asthmatic exacerbation: She was Intubated upon presentation,  10/12 extubated, increased WOB, re-intubated. 10/14 extubated, lingering stridor, Received racemic epi x2 + another dose of Decadron.  Seen by ENT, had epiglottic and subglottic edema, started on tapering dose of prednisone.  Now has trach.  We will continue steroid taper dose, continue Claritin, Singulair, BDs and ICS and management per pulmonology.   Hypertensive urgency:  Initial BP 240/112, MAP 136.  Echo with moderate LVH.  Blood pressure only borderline high.  We will continue hydralazine, amlodipine and current dose of Coreg and add Imdur 15 mg today.  Acute Metabolic Encephalopathy due to hypertensive emergency. C/b anxiety Imaging negative for PRES. CT head negative. MRI negative. Has had sundowning at night this admission, even nights without respiratory distress, this morning, she is fully alert and oriented.  -Cont Seroquel at 100 qhs (increased 10/25) -Cont prozac (added 10/22) -Taper steroids -BID Clonazepam added 10/22 > Change to 0.5 mg PRN   CKD stage IV: Please note that previous all progress notes have indicated that patient had AKI however after chart review, it appears that since admission, patient's creatinine has remained between 2-2 0.3 with a GFR that falls in CKD stage IV, there is no previous lab available to compare creatinine with.  I believe this is her CKD stage IV.  This is at baseline.   DM with uncontrolled hyperglycemia: Blood sugar controlled, continue Levemir 40 units twice daily and continue SSI.  Acute anemia due to critical illness:  Hemoglobin stable around 9, transfuse if less than 7.  DVT prophylaxis: heparin injection 5,000 Units Start:  09/20/21 0845 SCDs Start: 09/20/21 0831   Code Status: Full Code  Family Communication:  None present at bedside.  Plan of care discussed with patient in length and he verbalized understanding and agreed with it.  Status is: Inpatient  Remains inpatient appropriate because: Needs further work-up.  Medical management.  Estimated body mass index is 36.47 kg/m as calculated from the following:   Height as of this encounter: 5\' 6"  (1.676 m).   Weight as of this encounter: 102.5 kg.     Nutritional Assessment: Body mass index is 36.47 kg/m.Marland Kitchen Seen by dietician.  I agree with the assessment and plan as outlined below: Nutrition Status: Nutrition Problem: Inadequate oral intake Etiology: inability to eat Signs/Symptoms: NPO status Interventions: Ensure Enlive (each supplement provides 350kcal and 20 grams of protein), MVI  .  Skin Assessment: I have examined the patient's skin and I agree with the wound assessment as performed by the wound care RN as outlined below:    Consultants:  PCCM  Procedures:  Intubation  Antimicrobials:  Anti-infectives (From admission, onward)    Start     Dose/Rate Route Frequency Ordered Stop   10/04/21 1100  vancomycin (VANCOREADY) IVPB 1250 mg/250 mL  Status:  Discontinued        1,250 mg 166.7 mL/hr over 90 Minutes Intravenous Every 48 hours 10/02/21 0939 10/03/21 1508   10/02/21 2200  ceFEPIme (MAXIPIME) 2 g in sodium chloride 0.9 % 100 mL IVPB  Status:  Discontinued        2 g 200 mL/hr over 30 Minutes Intravenous Every 12 hours 10/02/21 0948 10/04/21 1010   10/02/21 1030  vancomycin (VANCOREADY) IVPB 2000 mg/400 mL        2,000 mg 200 mL/hr over 120 Minutes Intravenous  Once 10/02/21 0939 10/02/21 1324   09/27/21 0900  ceFEPIme (MAXIPIME) 2 g in sodium chloride 0.9 % 100 mL IVPB  Status:  Discontinued        2 g 200 mL/hr over 30 Minutes Intravenous Every 12 hours 09/27/21 0800 10/02/21 0948   09/27/21 0900  vancomycin (VANCOREADY) IVPB  1500 mg/300 mL  Status:  Discontinued        1,500 mg 150 mL/hr over 120 Minutes Intravenous Every 48 hours 09/27/21 0800 09/28/21 1058   09/21/21 0800  cefTRIAXone (ROCEPHIN) 2 g in sodium chloride 0.9 % 100 mL IVPB        2 g 200 mL/hr over 30 Minutes Intravenous Every 24 hours 09/21/21 0742 09/25/21 0808   09/20/21 0945  cefTRIAXone (ROCEPHIN) 2 g in sodium chloride 0.9 % 100 mL IVPB  Status:  Discontinued        2 g 200 mL/hr over 30 Minutes Intravenous Every 24 hours 09/20/21 0936 09/21/21 0742          Subjective:  Patient seen and examined.  She has no complaints.  Objective: Vitals:   10/10/21 0400 10/10/21 0726 10/10/21 0835 10/10/21 1120  BP: (!) 166/68  (!) 158/65   Pulse:  88 88 84  Resp: 20 (!) 24  18  Temp: 98.4 F (36.9 C)  99.3 F (37.4 C)   TempSrc: Oral  Oral   SpO2: 91% 100% 96% (!) 10%  Weight:      Height:        Intake/Output Summary (Last 24 hours) at 10/10/2021 1213 Last data filed at 10/09/2021 2110 Gross per 24  hour  Intake 240 ml  Output --  Net 240 ml    Filed Weights   10/06/21 0244 10/08/21 0400 10/09/21 0500  Weight: 103.2 kg 102.5 kg 102.5 kg    Examination:  General exam: Appears calm and comfortable, trach in place. Respiratory system: Clear to auscultation. Respiratory effort normal. Cardiovascular system: S1 & S2 heard, RRR. No JVD, murmurs, rubs, gallops or clicks. No pedal edema. Gastrointestinal system: Abdomen is nondistended, soft and nontender. No organomegaly or masses felt. Normal bowel sounds heard. Central nervous system: Alert and oriented. No focal neurological deficits. Extremities: Symmetric 5 x 5 power. Skin: No rashes, lesions or ulcers.  Psychiatry: Judgement and insight appear normal. Mood & affect appropriate.    Data Reviewed: I have personally reviewed following labs and imaging studies  CBC: Recent Labs  Lab 10/04/21 0329 10/05/21 0316 10/05/21 0323 10/05/21 0937 10/06/21 0306 10/07/21 1149  10/08/21 0415  WBC 23.3*  --  19.0*  --  17.6* 20.0* 15.2*  HGB 9.9*   < > 10.0* 10.9* 9.6* 9.9* 8.8*  HCT 32.2*   < > 32.7* 32.0* 29.4* 32.0* 28.4*  MCV 93.3  --  94.0  --  92.5 93.8 93.7  PLT 266  --  243  --  221 227 182   < > = values in this interval not displayed.    Basic Metabolic Panel: Recent Labs  Lab 10/05/21 0323 10/05/21 0535 10/05/21 0937 10/05/21 1626 10/06/21 0053 10/06/21 0306 10/07/21 1149 10/08/21 0415 10/09/21 0356  NA 135  --  138  --   --  138 138 139 141  K 5.8*   < > 5.8*   < > 4.3 4.3 4.4 4.0 4.5  CL 106  --   --   --   --  106 104 107 107  CO2 22  --   --   --   --  25 25 27 27   GLUCOSE 303*  --   --   --   --  101* 233* 167* 151*  BUN 59*  --   --   --   --  56* 54* 53* 49*  CREATININE 2.24*  --   --   --   --  2.11* 2.11* 2.18* 2.18*  CALCIUM 10.0  --   --   --   --  9.8 9.8 9.4 9.9  MG 2.0  --   --   --   --   --  1.9 1.8  --   PHOS 3.7  --   --   --   --   --  3.3 3.5  --    < > = values in this interval not displayed.    GFR: Estimated Creatinine Clearance: 35.7 mL/min (A) (by C-G formula based on SCr of 2.18 mg/dL (H)). Liver Function Tests: No results for input(s): AST, ALT, ALKPHOS, BILITOT, PROT, ALBUMIN in the last 168 hours. No results for input(s): LIPASE, AMYLASE in the last 168 hours. No results for input(s): AMMONIA in the last 168 hours. Coagulation Profile: No results for input(s): INR, PROTIME in the last 168 hours. Cardiac Enzymes: No results for input(s): CKTOTAL, CKMB, CKMBINDEX, TROPONINI in the last 168 hours. BNP (last 3 results) No results for input(s): PROBNP in the last 8760 hours. HbA1C: No results for input(s): HGBA1C in the last 72 hours. CBG: Recent Labs  Lab 10/09/21 1607 10/09/21 2039 10/10/21 0038 10/10/21 0423 10/10/21 0832  GLUCAP 135* 301* 218* 111* 131*  Lipid Profile: No results for input(s): CHOL, HDL, LDLCALC, TRIG, CHOLHDL, LDLDIRECT in the last 72 hours.  Thyroid Function Tests: No  results for input(s): TSH, T4TOTAL, FREET4, T3FREE, THYROIDAB in the last 72 hours. Anemia Panel: No results for input(s): VITAMINB12, FOLATE, FERRITIN, TIBC, IRON, RETICCTPCT in the last 72 hours. Sepsis Labs: No results for input(s): PROCALCITON, LATICACIDVEN in the last 168 hours.  Recent Results (from the past 240 hour(s))  Surgical PCR screen     Status: None   Collection Time: 10/01/21  8:26 AM   Specimen: Nasal Mucosa; Nasal Swab  Result Value Ref Range Status   MRSA, PCR NEGATIVE NEGATIVE Final   Staphylococcus aureus NEGATIVE NEGATIVE Final    Comment: (NOTE) The Xpert SA Assay (FDA approved for NASAL specimens in patients 103 years of age and older), is one component of a comprehensive surveillance program. It is not intended to diagnose infection nor to guide or monitor treatment. Performed at [facility], [place] [address] [place], [place] [address]   Culture, Respiratory w Gram Stain     Status: None   Collection Time: 10/01/21  4:20 PM   Specimen: Tracheal Aspirate  Result Value Ref Range Status   Specimen Description TRACHEAL ASPIRATE  Final   Special Requests NONE  Final   Gram Stain   Final    RARE SQUAMOUS EPITHELIAL CELLS PRESENT MODERATE WBC PRESENT,BOTH PMN AND MONONUCLEAR MODERATE GRAM POSITIVE COCCI FEW GRAM POSITIVE RODS FEW GRAM NEGATIVE RODS    Culture   Final    FEW Normal respiratory flora-no Staph aureus or Pseudomonas seen Performed at [facility], [place] [address] [place], [place] [address]    Report Status 10/03/2021 FINAL  Final  Culture, Respiratory w Gram Stain     Status: None   Collection Time: 10/08/21  3:30 PM   Specimen: Sputum; Respiratory  Result Value Ref Range Status   Specimen Description SPU  Final   Special Requests NONE  Final   Gram Stain   Final    NO ORGANISMS SEEN SQUAMOUS EPITHELIAL CELLS PRESENT MODERATE WBC PRESENT,BOTH PMN AND MONONUCLEAR FEW GRAM NEGATIVE RODS RARE GRAM POSITIVE COCCI    Culture    Final    RARE Normal respiratory flora-no Staph aureus or Pseudomonas seen Performed at [place] [address], [place], [place] [address]    Report Status 10/10/2021 FINAL  Final       Radiology Studies: No results found.  Scheduled Meds:  amLODipine  10 mg Per Tube Daily   arformoterol  15 mcg Nebulization BID   budesonide (PULMICORT) nebulizer solution  0.25 mg Nebulization BID   carvedilol  50 mg Per Tube BID WC   chlorhexidine  15 mL Mouth Rinse BID   Chlorhexidine Gluconate Cloth  6 each Topical Daily   docusate  100 mg Per Tube BID   feeding supplement  237 mL Oral BID BM   feeding supplement (PROSource TF)  45 mL Per Tube BID   FLUoxetine  10 mg Per Tube Daily   fluticasone  2 spray Each Nare Daily   heparin  5,000 Units Subcutaneous Q8H   hydrALAZINE  100 mg Per Tube Q8H   insulin aspart  0-15 Units Subcutaneous Q4H   insulin detemir  40 Units Subcutaneous BID   loratadine  10 mg Per Tube Daily   mouth rinse  15 mL Mouth Rinse q12n4p   montelukast  10 mg Per Tube QHS   pantoprazole sodium  40 mg Per Tube Daily   polyethylene glycol  17 g Per Tube Daily   [START ON 10/12/2021] predniSONE  10 mg Per Tube Q breakfast   predniSONE  20 mg Per Tube Q breakfast   QUEtiapine  100 mg Per Tube QHS   sodium chloride flush  10-40 mL Intracatheter Q12H   Continuous Infusions:     LOS: 20 days   Time spent: 28 minutes   Darliss Cheney, MD [facility]  10/10/2021, 12:13 PM  Please page via [place] and do not message via secure chat for anything urgent. Secure chat can be used for anything non urgent.  How to contact the [facility] Attending or Consulting provider [place] or covering provider during after hours [place], for this patient?  Check the care team in [facility] and look for a) attending/consulting [facility] provider listed and b) the [facility] team listed. Page or secure chat [phone]. Log into [email or web address] and use [place]'s universal password to access. If you do not  have the password, please contact the hospital operator. Locate the [facility] provider you are looking for under [facility] and page to a number that you can be directly reached. If you still have difficulty reaching the provider, please page the [facility] (Director on Call) for the Hospitalists listed on amion for assistance.

## 2021-10-11 DIAGNOSIS — Z978 Presence of other specified devices: Secondary | ICD-10-CM | POA: Diagnosis not present

## 2021-10-11 DIAGNOSIS — J9600 Acute respiratory failure, unspecified whether with hypoxia or hypercapnia: Secondary | ICD-10-CM | POA: Diagnosis not present

## 2021-10-11 LAB — GLUCOSE, CAPILLARY
Glucose-Capillary: 140 mg/dL — ABNORMAL HIGH (ref 70–99)
Glucose-Capillary: 144 mg/dL — ABNORMAL HIGH (ref 70–99)
Glucose-Capillary: 153 mg/dL — ABNORMAL HIGH (ref 70–99)
Glucose-Capillary: 274 mg/dL — ABNORMAL HIGH (ref 70–99)
Glucose-Capillary: 299 mg/dL — ABNORMAL HIGH (ref 70–99)
Glucose-Capillary: 359 mg/dL — ABNORMAL HIGH (ref 70–99)
Glucose-Capillary: 99 mg/dL (ref 70–99)

## 2021-10-11 MED ORDER — INSULIN ASPART 100 UNIT/ML IJ SOLN
0.0000 [IU] | Freq: Three times a day (TID) | INTRAMUSCULAR | Status: DC
  Administered 2021-10-11: 11 [IU] via SUBCUTANEOUS
  Administered 2021-10-11 – 2021-10-13 (×3): 3 [IU] via SUBCUTANEOUS

## 2021-10-11 MED ORDER — GUAIFENESIN 100 MG/5ML PO LIQD
5.0000 mL | ORAL | Status: DC | PRN
  Administered 2021-10-11 – 2021-10-13 (×4): 5 mL via ORAL
  Filled 2021-10-11 (×4): qty 5

## 2021-10-11 MED ORDER — INSULIN ASPART 100 UNIT/ML IJ SOLN
4.0000 [IU] | Freq: Three times a day (TID) | INTRAMUSCULAR | Status: DC
  Administered 2021-10-11 – 2021-10-13 (×4): 4 [IU] via SUBCUTANEOUS

## 2021-10-11 MED ORDER — INSULIN ASPART 100 UNIT/ML IJ SOLN
0.0000 [IU] | Freq: Every day | INTRAMUSCULAR | Status: DC
  Administered 2021-10-11: 5 [IU] via SUBCUTANEOUS

## 2021-10-11 MED ORDER — INSULIN DETEMIR 100 UNIT/ML ~~LOC~~ SOLN: 45.0000 [IU] | Freq: Two times a day (BID) | SUBCUTANEOUS | Status: DC

## 2021-10-11 MED ORDER — INSULIN DETEMIR 100 UNIT/ML ~~LOC~~ SOLN
35.0000 [IU] | Freq: Two times a day (BID) | SUBCUTANEOUS | Status: DC
  Administered 2021-10-11 (×2): 35 [IU] via SUBCUTANEOUS
  Filled 2021-10-11 (×5): qty 0.35

## 2021-10-11 NOTE — Progress Notes (Signed)
PROGRESS NOTE    Maureen Jones  ZOX:[id number] DOB: 06-04-1966 DOA: 09/20/2021 PCP: Pcp, No   Brief Narrative:  55 y/o F with PMH HTN, DM who presented to [facility] on 10/10 with reports of acute altered mental status. HTN Emergency.  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 She was hypertensive with initial pressure of 240/112. Intubated.  10/12 extubated, increased WOB, re-intubated. 10/14 extubated, lingering stridor, Received racemic epi x2 + another dose of Decadron, Mild agitation requiring restart of Precedex. Did not require re-intubation, however stay complicated by continued stridor. 10/18 ENT scope > showed edema of the glottis and subglottic larynx. Continue on high dose steroids. 10/25 stridor and respiratory distress, requiring intubation and tracheostomy. Now on [employer]. PT/OT/ST following. Pulmonary will continue to follow for trach management.    Significant Hospital Events: Including procedures, antibiotic start and stop dates in addition to other pertinent events   10/10 Admit with acute encephalopathy, hypertensive 10/12 extubated, increased WOB, re-intubated 10/14 extubated , lingering stridor, Received racemic epi x2 over the last 24 hours + another dose of Decadron, Mild agitation requiring restart of Precedex 10/15 swallow evaluation >> dysphagia 3 diet 10/20 decompensated after switch to PO steroids 10/21 better after CAT and back on IV steroids 10/22 agitated back on  BIPAP 10/25 stridor again. Marked work of breathing. Intubated, airway noises, stridor and wheezing completely resolved post intubation. Spoke to ENT who will follow after trach. Trach placed by Dr Vaughan Browner. Seroquel increased to 100 qhs, changes solumedrol to prednisone to begin taper, rested on propofol over night. Tubefeeds started. RUE PICC placed  10/26 prop weaning off attempting ATC     Assessment & Plan:   Active Problems:    Encephalopathy acute   Endotracheal tube present   Acute respiratory failure (HCC)   Subglottic edema   Vocal cord dysfunction  Acute respiratory failure with hypoxia 2/2 mixed picture of VCD/post intubation subglottic and glottic edema/vocal hoarseness  +/- asthmatic exacerbation: She was Intubated upon presentation,  10/12 extubated, increased WOB, re-intubated. 10/14 extubated, lingering stridor, Received racemic epi x2 + another dose of Decadron.  Seen by ENT, had epiglottic and subglottic edema, started on tapering dose of prednisone.  Now has trach.  We will continue steroid taper dose, continue Claritin, Singulair, BDs and ICS and management per pulmonology.   Hypertensive urgency:  Initial BP 240/112, MAP 136.  Echo with moderate LVH.  Blood pressure now much better since we added Imdur.  We will continue hydralazine, amlodipine and, Coreg and Imdur.  Acute Metabolic Encephalopathy due to hypertensive emergency. C/b anxiety Imaging negative for PRES. CT head negative. MRI negative. Has had sundowning at night this admission, even nights without respiratory distress, this morning, she is fully alert and oriented.  -Cont Seroquel at 100 qhs (increased 10/25) -Cont prozac (added 10/22) -Taper steroids -BID Clonazepam added 10/22 > Change to 0.5 mg PRN   CKD stage IV: Please note that previous all progress notes have indicated that patient had AKI however after chart review, it appears that since admission, patient's creatinine has remained between 2-2 0.3 with a GFR that falls in CKD stage IV, there is no previous lab available to compare creatinine with.  I believe this is her CKD stage IV.  This is at baseline.   DM with uncontrolled hyperglycemia: Blood sugar controlled,, now on Levemir 35 units twice daily, NovoLog 4 units 3 times daily with meals and SSI.  Acute  anemia due to critical illness: Hemoglobin stable around 9, transfuse if less than 7.  Dysphagia: Patient is taking good  amount of p.o., evaluated by nutrition.  Plan to take cortrak out.  DVT prophylaxis: heparin injection 5,000 Units Start: 09/20/21 0845 SCDs Start: 09/20/21 0831   Code Status: Full Code  Family Communication:  None present at bedside.  Plan of care discussed with patient in length and he verbalized understanding and agreed with it.  Status is: Inpatient  Remains inpatient appropriate because: Needs further work-up.  Medical management.  Estimated body mass index is 37.22 kg/m as calculated from the following:   Height as of this encounter: 5\' 6"  (1.676 m).   Weight as of this encounter: 104.6 kg.     Nutritional Assessment: Body mass index is 37.22 kg/m.Marland Kitchen Seen by dietician.  I agree with the assessment and plan as outlined below: Nutrition Status: Nutrition Problem: Inadequate oral intake Etiology: inability to eat Signs/Symptoms: NPO status Interventions: Ensure Enlive (each supplement provides 350kcal and 20 grams of protein), MVI  .  Skin Assessment: I have examined the patient's skin and I agree with the wound assessment as performed by the wound care RN as outlined below:    Consultants:  PCCM  Procedures:  Intubation  Antimicrobials:  Anti-infectives (From admission, onward)    Start     Dose/Rate Route Frequency Ordered Stop   10/04/21 1100  vancomycin (VANCOREADY) IVPB 1250 mg/250 mL  Status:  Discontinued        1,250 mg 166.7 mL/hr over 90 Minutes Intravenous Every 48 hours 10/02/21 0939 10/03/21 1508   10/02/21 2200  ceFEPIme (MAXIPIME) 2 g in sodium chloride 0.9 % 100 mL IVPB  Status:  Discontinued        2 g 200 mL/hr over 30 Minutes Intravenous Every 12 hours 10/02/21 0948 10/04/21 1010   10/02/21 1030  vancomycin (VANCOREADY) IVPB 2000 mg/400 mL        2,000 mg 200 mL/hr over 120 Minutes Intravenous  Once 10/02/21 0939 10/02/21 1324   09/27/21 0900  ceFEPIme (MAXIPIME) 2 g in sodium chloride 0.9 % 100 mL IVPB  Status:  Discontinued        2 g 200  mL/hr over 30 Minutes Intravenous Every 12 hours 09/27/21 0800 10/02/21 0948   09/27/21 0900  vancomycin (VANCOREADY) IVPB 1500 mg/300 mL  Status:  Discontinued        1,500 mg 150 mL/hr over 120 Minutes Intravenous Every 48 hours 09/27/21 0800 09/28/21 1058   09/21/21 0800  cefTRIAXone (ROCEPHIN) 2 g in sodium chloride 0.9 % 100 mL IVPB        2 g 200 mL/hr over 30 Minutes Intravenous Every 24 hours 09/21/21 0742 09/25/21 0808   09/20/21 0945  cefTRIAXone (ROCEPHIN) 2 g in sodium chloride 0.9 % 100 mL IVPB  Status:  Discontinued        2 g 200 mL/hr over 30 Minutes Intravenous Every 24 hours 09/20/21 0936 09/21/21 0742          Subjective:  Seen and examined.  She is very pleasant, happy, complains of some sneezing and sinus pressure.    Objective: Vitals:   10/11/21 0733 10/11/21 0820 10/11/21 1149 10/11/21 1153  BP:  (!) 144/59  (!) 137/57  Pulse: 83 81 82 83  Resp: (!) 28 (!) 21 20 16   Temp:  99.4 F (37.4 C)  99.9 F (37.7 C)  TempSrc:  Oral  Oral  SpO2: 95% 97%  96%  Weight:      Height:        Intake/Output Summary (Last 24 hours) at 10/11/2021 1253 Last data filed at 10/10/2021 2045 Gross per 24 hour  Intake --  Output 650 ml  Net -650 ml    Filed Weights   10/08/21 0400 10/09/21 0500 10/11/21 0500  Weight: 102.5 kg 102.5 kg 104.6 kg    Examination:  General exam: Appears calm and comfortable, trach in place Respiratory system: Clear to auscultation. Respiratory effort normal. Cardiovascular system: S1 & S2 heard, RRR. No JVD, murmurs, rubs, gallops or clicks. No pedal edema. Gastrointestinal system: Abdomen is nondistended, soft and nontender. No organomegaly or masses felt. Normal bowel sounds heard. Central nervous system: Alert and oriented. No focal neurological deficits. Extremities: Symmetric 5 x 5 power. Skin: No rashes, lesions or ulcers.  Psychiatry: Judgement and insight appear normal. Mood & affect appropriate.   Data Reviewed: I have  personally reviewed following labs and imaging studies  CBC: Recent Labs  Lab 10/05/21 0323 10/05/21 0937 10/06/21 0306 10/07/21 1149 10/08/21 0415  WBC 19.0*  --  17.6* 20.0* 15.2*  HGB 10.0* 10.9* 9.6* 9.9* 8.8*  HCT 32.7* 32.0* 29.4* 32.0* 28.4*  MCV 94.0  --  92.5 93.8 93.7  PLT 243  --  221 227 759    Basic Metabolic Panel: Recent Labs  Lab 10/05/21 0323 10/05/21 0535 10/05/21 0937 10/05/21 1626 10/06/21 0053 10/06/21 0306 10/07/21 1149 10/08/21 0415 10/09/21 0356  NA 135  --  138  --   --  138 138 139 141  K 5.8*   < > 5.8*   < > 4.3 4.3 4.4 4.0 4.5  CL 106  --   --   --   --  106 104 107 107  CO2 22  --   --   --   --  25 25 27 27   GLUCOSE 303*  --   --   --   --  101* 233* 167* 151*  BUN 59*  --   --   --   --  56* 54* 53* 49*  CREATININE 2.24*  --   --   --   --  2.11* 2.11* 2.18* 2.18*  CALCIUM 10.0  --   --   --   --  9.8 9.8 9.4 9.9  MG 2.0  --   --   --   --   --  1.9 1.8  --   PHOS 3.7  --   --   --   --   --  3.3 3.5  --    < > = values in this interval not displayed.    GFR: Estimated Creatinine Clearance: 36 mL/min (A) (by C-G formula based on SCr of 2.18 mg/dL (H)). Liver Function Tests: No results for input(s): AST, ALT, ALKPHOS, BILITOT, PROT, ALBUMIN in the last 168 hours. No results for input(s): LIPASE, AMYLASE in the last 168 hours. No results for input(s): AMMONIA in the last 168 hours. Coagulation Profile: No results for input(s): INR, PROTIME in the last 168 hours. Cardiac Enzymes: No results for input(s): CKTOTAL, CKMB, CKMBINDEX, TROPONINI in the last 168 hours. BNP (last 3 results) No results for input(s): PROBNP in the last 8760 hours. HbA1C: No results for input(s): HGBA1C in the last 72 hours. CBG: Recent Labs  Lab 10/10/21 2044 10/11/21 0019 10/11/21 0450 10/11/21 0821 10/11/21 1159  GLUCAP 377* 274* 140* 99 144*    Lipid Profile: No results for input(s):  CHOL, HDL, LDLCALC, TRIG, CHOLHDL, LDLDIRECT in the last 72  hours.  Thyroid Function Tests: No results for input(s): TSH, T4TOTAL, FREET4, T3FREE, THYROIDAB in the last 72 hours. Anemia Panel: No results for input(s): VITAMINB12, FOLATE, FERRITIN, TIBC, IRON, RETICCTPCT in the last 72 hours. Sepsis Labs: No results for input(s): PROCALCITON, LATICACIDVEN in the last 168 hours.  Recent Results (from the past 240 hour(s))  Culture, Respiratory w Gram Stain     Status: None   Collection Time: 10/01/21  4:20 PM   Specimen: Tracheal Aspirate  Result Value Ref Range Status   Specimen Description TRACHEAL ASPIRATE  Final   Special Requests NONE  Final   Gram Stain   Final    RARE SQUAMOUS EPITHELIAL CELLS PRESENT MODERATE WBC PRESENT,BOTH PMN AND MONONUCLEAR MODERATE GRAM POSITIVE COCCI FEW GRAM POSITIVE RODS FEW GRAM NEGATIVE RODS    Culture   Final    FEW Normal respiratory flora-no Staph aureus or Pseudomonas seen Performed at [facility], [address], [place], [place] [address]    Report Status 10/03/2021 FINAL  Final  Culture, Respiratory w Gram Stain     Status: None   Collection Time: 10/08/21  3:30 PM   Specimen: Sputum; Respiratory  Result Value Ref Range Status   Specimen Description SPU  Final   Special Requests NONE  Final   Gram Stain   Final    NO ORGANISMS SEEN SQUAMOUS EPITHELIAL CELLS PRESENT MODERATE WBC PRESENT,BOTH PMN AND MONONUCLEAR FEW GRAM NEGATIVE RODS RARE GRAM POSITIVE COCCI    Culture   Final    RARE Normal respiratory flora-no Staph aureus or Pseudomonas seen Performed at [facility] [address], [place], [place] [address]    Report Status 10/10/2021 FINAL  Final       Radiology Studies: No results found.  Scheduled Meds:  amLODipine  10 mg Per Tube Daily   arformoterol  15 mcg Nebulization BID   budesonide (PULMICORT) nebulizer solution  0.25 mg Nebulization BID   carvedilol  50 mg Per Tube BID WC   chlorhexidine  15 mL Mouth Rinse BID   Chlorhexidine Gluconate Cloth  6 each  Topical Daily   docusate  100 mg Per Tube BID   feeding supplement  237 mL Oral BID BM   feeding supplement (PROSource TF)  45 mL Per Tube BID   FLUoxetine  10 mg Per Tube Daily   fluticasone  2 spray Each Nare Daily   heparin  5,000 Units Subcutaneous Q8H   hydrALAZINE  100 mg Per Tube Q8H   insulin aspart  0-20 Units Subcutaneous TID WC   insulin aspart  0-5 Units Subcutaneous QHS   insulin aspart  4 Units Subcutaneous TID WC   isosorbide mononitrate  15 mg Oral Daily   loratadine  10 mg Per Tube Daily   mouth rinse  15 mL Mouth Rinse q12n4p   montelukast  10 mg Per Tube QHS   pantoprazole sodium  40 mg Per Tube Daily   polyethylene glycol  17 g Per Tube Daily   [START ON 10/12/2021] predniSONE  10 mg Per Tube Q breakfast   QUEtiapine  100 mg Per Tube QHS   sodium chloride flush  10-40 mL Intracatheter Q12H   Continuous Infusions:     LOS: 21 days   Time spent: 27 minutes   Darliss Cheney, MD [facility]  10/11/2021, 12:53 PM  Please page via [facility] and do not message via secure chat  for anything urgent. Secure chat can be used for anything non urgent.  How to contact the [facility] Attending or Consulting provider [place] or covering provider during after hours [place], for this patient?  Check the care team in [facility] and look for a) attending/consulting [facility] provider listed and b) the [facility] team listed. Page or secure chat [phone]. Log into [email or web address] and use [place]'s universal password to access. If you do not have the password, please contact the hospital operator. Locate the [facility] provider you are looking for under [facility] and page to a number that you can be directly reached. If you still have difficulty reaching the provider, please page the [facility] (Director on Call) for the Hospitalists listed on amion for assistance.

## 2021-10-11 NOTE — Care Management Important Message (Signed)
Important Message  Patient Details  Name: Maureen Jones MRN: [id number] Date of Birth: [birth year]   Medicare Important Message Given:  Yes     Teller Wakefield 10/11/2021, 2:04 PM

## 2021-10-11 NOTE — Progress Notes (Signed)
Occupational Therapy Treatment Patient Details Name: Maureen Jones MRN: [id number] DOB: 1966/04/19 Today's Date: 10/11/2021   History of present illness 55 y.o. female admitted 09/20/21 with AMS. 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 CXR with low lung volumes, L>R interstitial opacities. Acute metabolic encephalopathy in setting of HTN urgency, possible PRES. ETT (10/10-10/12, 10/12-10/14). Course complicated by confusion/agitation overnight 10/17. 10/25 trach and bronch with return to vent. PMHx:HTN, DM.   OT comments  Patient supine in bed and agreeable to OT, reports feeling not well overall today but agreeable to engage.  Reports SOB throughout session but VSS via trach collar with SPO2 97-100%, RR ranged from 20-30.  Patient completing ADLs with supervision for LB bathing/dressing, min assist for UB dressing.  Min cueing for problem solving, unaware of BM incontinence.  She is highly motivated and eager to get home.  Reports feeling "foggy headed", reports its worse when shes tired and not feeling well.  Will follow acutely.    Recommendations for follow up therapy are one component of a multi-disciplinary discharge planning process, led by the attending physician.  Recommendations may be updated based on patient status, additional functional criteria and insurance authorization.    Follow Up Recommendations  Home health OT    Assistance Recommended at Discharge Frequent or constant Supervision/Assistance  Equipment Recommendations  BSC    Recommendations for Other Services      Precautions / Restrictions Precautions Precautions: Fall;Other (comment) Precaution Comments: trach, cortrak, picc Restrictions Weight Bearing Restrictions: No       Mobility Bed Mobility Overal bed mobility: Needs Assistance Bed Mobility: Supine to Sit     Supine to sit: Supervision     General bed mobility comments: HOB elevated but no assist required    Transfers Overall transfer  level: Needs assistance   Transfers: Sit to/from Stand Sit to Stand: Supervision           General transfer comment: close supervision for safety     Balance Overall balance assessment: Needs assistance Sitting-balance support: No upper extremity supported;Feet supported Sitting balance-[name]: Good Sitting balance - Comments: EOb without assist   Standing balance support: Single extremity supported;During functional activity Standing balance-[name]: Poor Standing balance comment: 1 UE support on bed rail during ADLs                           ADL either performed or assessed with clinical judgement   ADL Overall ADL's : Needs assistance/impaired     Grooming: Set up;Oral care;Sitting;Wash/dry hands       Lower Body Bathing: Supervison/ safety;Sit to/from stand Lower Body Bathing Details (indicate cue type and reason): to apply lotion to feet and complete peri care (after incontient BM) Upper Body Dressing : Minimal assistance;Sitting Upper Body Dressing Details (indicate cue type and reason): to change gown Lower Body Dressing: Supervision/safety;Sit to/from stand Lower Body Dressing Details (indicate cue type and reason): change socks, sit to stand supervision             Functional mobility during ADLs: Supervision/safety General ADL Comments: pt limited by decreased activity tolerance and endurance, reports SOB throughout session but VSS     Vision       Perception     Praxis      Cognition Arousal/Alertness: Awake/alert Behavior During Therapy: WFL for tasks assessed/performed Overall Cognitive Status: Impaired/Different from baseline Area of Impairment: Attention;Awareness;Problem solving  Current Attention Level: Selective       Awareness: Emergent Problem Solving: Requires verbal cues;Slow processing General Comments: pt with PMV on, reports difficulty attending and feeling "foggy headed" during  session.  Decreased awareness of bowel movement, and requires cueing for problem solving.          Exercises     Shoulder Instructions       General Comments VSS on TC at 5L 28%    Pertinent Vitals/ Pain       Pain Assessment: Faces Faces Pain Scale: Hurts little more Pain Location: neck/throat, ears Pain Descriptors / Indicators: Tender Pain Intervention(s): Limited activity within patient's tolerance;Monitored during session;Repositioned  Home Living                                          Prior Functioning/Environment              Frequency  Min 2X/week        Progress Toward Goals  OT Goals(current goals can now be found in the care plan section)  Progress towards OT goals: Progressing toward goals  Acute Rehab OT Goals OT Goal Formulation: With patient Time For Goal Achievement: 10/24/21 Potential to Achieve Goals: Good ADL Goals Pt Will Perform Grooming: with modified independence;standing Pt Will Perform Lower Body Dressing: with modified independence;sit to/from stand Pt Will Transfer to Toilet: with modified independence;ambulating;bedside commode Pt Will Perform Tub/Shower Transfer: Shower transfer;with supervision;shower seat;ambulating Additional ADL Goal #1: Pt will demonstrate anticipatory awareness during ADL routine. Additional ADL Goal #2: Pt will recall and complete 3 step trail making task with supervision.  Plan Discharge plan remains appropriate;Frequency remains appropriate    Co-evaluation                 AM-PAC OT "6 Clicks" Daily Activity     Outcome Measure   Help from another person eating meals?: None Help from another person taking care of personal grooming?: A Little Help from another person toileting, which includes using toliet, bedpan, or urinal?: A Little Help from another person bathing (including washing, rinsing, drying)?: A Little Help from another person to put on and taking off regular  upper body clothing?: A Little Help from another person to put on and taking off regular lower body clothing?: A Little 6 Click Score: 19    End of Session Equipment Utilized During Treatment: Oxygen (via trach)  OT Visit Diagnosis: Other abnormalities of gait and mobility (R26.89);Other symptoms and signs involving cognitive function   Activity Tolerance Patient tolerated treatment well   Patient Left with call bell/phone within reach;with bed alarm set;Other (comment) (seated EOB)   Nurse Communication Mobility status        Time: 8527-7824 OT Time Calculation (min): 38 min  Charges: OT General Charges $OT Visit: 1 Visit OT Treatments $Self Care/Home Management : 38-52 mins  [place] Pager [phone] Office [phone]   Delight Stare 10/11/2021, 10:33 AM

## 2021-10-11 NOTE — Progress Notes (Signed)
Speech Language Pathology Treatment: Dysphagia;Passy Muir Speaking valve  Patient Details Name: Maureen Jones MRN: [id number] DOB: 01/12/1966 Today's Date: 10/11/2021 Time: 4098-1191 SLP Time Calculation (min) (ACUTE ONLY): 30 min  Assessment / Plan / Recommendation Clinical Impression  Pt seen at bedside to assess diet tolerance and continue education of PMSV care and use. Pt was seated at EOB during this session. No family present.  Pt was sleeping upon arrival of SLP. It was noted that pt had PMSV in place while asleep. SLP provided education regarding the importance of removing PMSV during sleep (and breathing treatments). Pt responded that she didn't realize she was going to fall asleep. Pt indicated comfort with placement and removal of PMSV.  Pt reports new onset of sneezing and sinus pressure over the past day or so. She also indicates ongoing sore/irritated throat, likely due to Cortrak and trach placement. RN and MD informed.  Pt was observed with trials of solid textures and thin liquids. Pt verbalized awareness to avoid straws, take small bites/sips, and swallow 2x per bite. She reports she is trying to adhere to these strategies to maximize safe swallow. No overt s/s aspiration observed on any texture. Pt said she feels confident that she can meet nutrition/hydration needs PO, and looks forward to having the Cortrak tube removed.    HPI HPI: Pt is a 55 y/o female admitted with AMS 10/10. Stroke w/u negative, dx with acute metabolic encephalopathy in the setting of hypertensive emergency. ETT 10/10-10/12; reintubated 10/12 d/t increased WOB, not mobilizing secretions, stridor, received steroids, extubated 10/14 with lingering stridor, which was improving 10/15. ENT scoped pt 10/18: "Edema of the glottis and subglottic larynx following recent intubation and then repeat intubation.  There is no evidence of cord paralysis or any tumor present.  There is currently no stridor.  Allow more  time for resolution of the swelling.  Unfortunately she is at risk for developing subglottic stenosis." SLP followed pt 10/15-10/24. MBS 10/19: trace posterior spillage, mild pharyngeal delay, trace transient penetration; regular texture diet with thin liquids recommended and SLP ultimately signed off with pt demonstrating adequate tolerance. 10/25: Stridor again noted; pt intubated and trach placed. Cortrak placed 10/26.      SLP Plan  Continue with current plan of care      Recommendations for follow up therapy are one component of a multi-disciplinary discharge planning process, led by the attending physician.  Recommendations may be updated based on patient status, additional functional criteria and insurance authorization.    Recommendations  Diet recommendations: Regular;Thin liquid Liquids provided via: Cup;No straw Medication Administration: Whole meds with puree Supervision: Patient able to self feed Compensations: Slow rate;Small sips/bites;Follow solids with liquid Postural Changes and/or Swallow Maneuvers: Seated upright 90 degrees      Patient may use Passy-Muir Speech Valve: During all waking hours (remove during sleep);During PO intake/meals PMSV Supervision: Intermittent MD: Please consider changing trach tube to : Cuffless         Oral Care Recommendations: Oral care BID Follow up Recommendations: Other (comment) (tbd) SLP Visit Diagnosis: Dysphagia, oropharyngeal phase (R13.12);Aphonia (R49.1) Plan: Continue with current plan of care       Sharon. Quentin Ore, [facility], [place] Speech Language Pathologist Office: [phone]  Shonna Chock  10/11/2021, 12:01 PM

## 2021-10-11 NOTE — Progress Notes (Addendum)
Inpatient Diabetes Program Recommendations  AACE/ADA: New Consensus Statement on Inpatient Glycemic Control (2015)  Target Ranges:  Prepandial:   less than 140 mg/dL      Peak postprandial:   less than 180 mg/dL (1-2 hours)      Critically ill patients:  140 - 180 mg/dL   Lab Results  Component Value Date   GLUCAP 99 10/11/2021   HGBA1C 12.7 (H) 09/20/2021    Review of Glycemic Control Results for CHENEL, WERNLI (MRN [id number]) as of 10/11/2021 11:27  Ref. Range 10/10/2021 16:19 10/10/2021 20:44 10/11/2021 00:19 10/11/2021 04:50 10/11/2021 08:21  Glucose-Capillary Latest Ref Range: 70 - 99 mg/dL 242 (H) 377 (H) 274 (H) 140 (H) 99  Diabetes history: DM 2 Outpatient Diabetes medications:  Tresiba 120 units daily Novolog 18 units bid Current orders for Inpatient glycemic control:  Novolog resistant tid with meals and HS Novolog 4 units tid with meals Prednisone 20 mg daily (reduce to Prednisone 10 mg on 10/12/21) Inpatient Diabetes Program Recommendations:   Note patient received 10 units of Novolog correction overnight which likely reduced 0800 blood sugars to 99 mg/dL.  Agree with reduction of frequency of SSI to tid with meals and HS and the addition of meal coverage.   Recommend restart of Levemir 35 units bid as patient was on large dose of basal insulin prior to admit.  Also consider changing diet to CHO modified.   Thanks,  Adah Perl, RN, BC-ADM Inpatient Diabetes Coordinator Pager [phone]  (8a-5p)

## 2021-10-12 ENCOUNTER — Inpatient Hospital Stay (HOSPITAL_COMMUNITY): Payer: Medicare Other

## 2021-10-12 DIAGNOSIS — Z978 Presence of other specified devices: Secondary | ICD-10-CM | POA: Diagnosis not present

## 2021-10-12 DIAGNOSIS — J9600 Acute respiratory failure, unspecified whether with hypoxia or hypercapnia: Secondary | ICD-10-CM | POA: Diagnosis not present

## 2021-10-12 LAB — EXPECTORATED SPUTUM ASSESSMENT W GRAM STAIN, RFLX TO RESP C

## 2021-10-12 LAB — GLUCOSE, CAPILLARY
Glucose-Capillary: 104 mg/dL — ABNORMAL HIGH (ref 70–99)
Glucose-Capillary: 110 mg/dL — ABNORMAL HIGH (ref 70–99)
Glucose-Capillary: 164 mg/dL — ABNORMAL HIGH (ref 70–99)
Glucose-Capillary: 294 mg/dL — ABNORMAL HIGH (ref 70–99)
Glucose-Capillary: 62 mg/dL — ABNORMAL LOW (ref 70–99)
Glucose-Capillary: 86 mg/dL (ref 70–99)
Glucose-Capillary: >600 mg/dL (ref 70–99)

## 2021-10-12 MED ORDER — INSULIN DETEMIR 100 UNIT/ML ~~LOC~~ SOLN
40.0000 [IU] | Freq: Two times a day (BID) | SUBCUTANEOUS | Status: DC
  Administered 2021-10-12 (×2): 40 [IU] via SUBCUTANEOUS
  Filled 2021-10-12 (×4): qty 0.4

## 2021-10-12 NOTE — Progress Notes (Signed)
Physical Therapy Treatment Patient Details Name: Maureen Jones MRN: [id number] DOB: 1966/08/14 Today's Date: 10/12/2021   History of Present Illness 55 y.o. female admitted 09/20/21 with AMS. 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 CXR with low lung volumes, L>R interstitial opacities. Acute metabolic encephalopathy in setting of HTN urgency, possible PRES. ETT (10/10-10/12, 10/12-10/14). Course complicated by confusion/agitation overnight 10/17. 10/25 trach and bronch with return to vent. PMHx:HTN, DM.    PT Comments    Pt pleasant and reports a continued cough but able to increase ambulation distance on RA with PMSV in place. Pt denied stairs this session but agreeable to plan for next session. Pt encouraged to be OOB walking to bathroom and OOB to chair during day. HEP education provided and D/C plan appropriate.    95-97% on RA   Recommendations for follow up therapy are one component of a multi-disciplinary discharge planning process, led by the attending physician.  Recommendations may be updated based on patient status, additional functional criteria and insurance authorization.  Follow Up Recommendations  Home health PT     Assistance Recommended at Discharge Intermittent Supervision/Assistance  Equipment Recommendations  Rolling walker (2 wheels);3in1 (PT)    Recommendations for Other Services       Precautions / Restrictions Precautions Precautions: Other (comment) Precaution Comments: trach, cortrak, picc     Mobility  Bed Mobility               General bed mobility comments: pt sitting eOB on arrival    Transfers Overall transfer level: Modified independent                 General transfer comment: pt able to stand without assist, management for lines    Ambulation/Gait Ambulation/Gait assistance: Min guard Gait Distance (Feet): 230 Feet Assistive device: Rolling walker (2 wheels) Gait Pattern/deviations: Step-through pattern;Decreased  stride length   Gait velocity interpretation: <1.8 ft/sec, indicate of risk for recurrent falls General Gait Details: pt with PMSV on and RA throughout gait with SPO2 95-97% and pt without SOB. LImited reliance on RW but fatigued and agreeable to work toward stair ambulation next session.   Stairs             Wheelchair Mobility    Modified Rankin (Stroke Patients Only)       Balance Overall balance assessment: Needs assistance   Sitting balance-[name]: Good Sitting balance - Comments: EOb without assist   Standing balance support: Bilateral upper extremity supported   Standing balance comment: bil UE on RW for gait                            Cognition Arousal/Alertness: Awake/alert Behavior During Therapy: WFL for tasks assessed/performed Overall Cognitive Status: Within Functional Limits for tasks assessed                                          Exercises General Exercises - Lower Extremity Long Arc Quad: AROM;Seated;20 reps;Both Hip Flexion/Marching: AROM;Seated;Both;20 reps    General Comments        Pertinent Vitals/Pain Pain Assessment: No/denies pain    Home Living                          Prior Function            PT  Goals (current goals can now be found in the care plan section) Progress towards PT goals: Progressing toward goals    Frequency    Min 3X/week      PT Plan Current plan remains appropriate    Co-evaluation              AM-PAC PT "6 Clicks" Mobility   Outcome Measure  Help needed turning from your back to your side while in a flat bed without using bedrails?: None Help needed moving from lying on your back to sitting on the side of a flat bed without using bedrails?: None Help needed moving to and from a bed to a chair (including a wheelchair)?: A Little Help needed standing up from a chair using your arms (e.g., wheelchair or bedside chair)?: A Little Help needed to  walk in hospital room?: A Little Help needed climbing 3-5 steps with a railing? : A Lot 6 Click Score: 19    End of Session   Activity Tolerance: Patient tolerated treatment well Patient left: in bed;with call bell/phone within reach;with nursing/sitter in room Nurse Communication: Mobility status PT Visit Diagnosis: Other abnormalities of gait and mobility (R26.89);Difficulty in walking, not elsewhere classified (R26.2)     Time: 1062-6948 PT Time Calculation (min) (ACUTE ONLY): 22 min  Charges:  $Gait Training: 8-22 mins                     Bayard Males, PT Acute Rehabilitation Services Pager: [phone] Office: [place] 10/12/2021, 12:35 PM

## 2021-10-12 NOTE — Progress Notes (Addendum)
Pt noted to be in acute distress, having difficulty breathing, says that she is anxious, SBP elevated, Respiratory called to assess pt , PRN medication administered   10/12/21 1623  Vitals  Temp 98 F (36.7 C)  Temp Source Oral  BP (!) 172/68  MAP (mmHg) 100  BP Location Left Arm  BP Method Automatic  Patient Position (if appropriate) Lying  Pulse Rate 92  Pulse Rate Source Monitor  Resp 20  MEWS COLOR  MEWS Score Color Green  Oxygen Therapy  SpO2 93 %  MEWS Score  MEWS Temp 0  MEWS Systolic 0  MEWS Pulse 0  MEWS RR 0  MEWS LOC 0  MEWS Score 0

## 2021-10-12 NOTE — Progress Notes (Signed)
   NAMEElnoria Jones, MRN:  [id number], DOB:  Jun 14, 1966, LOS: 69 ADMISSION DATE:  09/20/2021, CONSULTATION DATE: 10/10 REFERRING MD:  Dr. Laverta Baltimore, CHIEF COMPLAINT:  AMS   History of Present Illness:  55 y/o F who presented to [facility] on 10/10 with reports of acute altered mental status secondary to hypertensive emergency with hypoxic respiratory failure related to VCD/post intubation subglottic and glottic edema/vocal hoarseness  +/- asthmatic exacerbation.  Ultimately required trach placement.   Pertinent  Medical History  HTN DM   Significant Hospital Events: Including procedures, antibiotic start and stop dates in addition to other pertinent events   10/10 Admit with acute encephalopathy, hypertensive 10/12 extubated, increased WOB, re-intubated 10/14 extubated , lingering stridor, Received racemic epi x2 over the last 24 hours + another dose of Decadron, Mild agitation requiring restart of Precedex 10/15 swallow evaluation >> dysphagia 3 diet 10/20 decompensated after switch to PO steroids 10/21 better after CAT and back on IV steroids 10/22 agitated back on  BIPAP 10/25 stridor again. Marked work of breathing. Intubated, airway noises, stridor and wheezing completely resolved post intubation. Spoke to ENT who will follow after trach. Trach placed by Dr Vaughan Browner. Seroquel increased to 100 qhs, changes solumedrol to prednisone to begin taper, rested on propofol over night. Tubefeeds started. RUE PICC placed  10/26 prop weaning off attempting ATC   Interim History / Subjective:  Feeling rough this am, more wheezing, new cough with tan sputum  Objective   Blood pressure (!) 157/75, pulse 74, temperature 98.6 F (37 C), temperature source Oral, resp. rate 15, height 5\' 6"  (1.676 m), weight 104.6 kg, SpO2 95 %.    FiO2 (%):  [21 %-28 %] 21 %  No intake or output data in the 24 hours ending 10/12/21 1215  Filed Weights   10/08/21 0400 10/09/21 0500 10/11/21 0500  Weight: 102.5 kg 102.5  kg 104.6 kg    Examination: Sitting up in no distress Wheezing bilaterally Thick tan secretions in suction tubing Ext with no edema Moves all 4 ext to command  Resolved Hospital Problem list   RML/RLL PNA (treated) Hypertensive emergency  Assessment & Plan:   Acute respiratory failure with hypoxia 2/2 mixed picture of VCD/post intubation subglottic and glottic edema/vocal hoarseness  +/- asthmatic exacerbation.  -Wheezing back with new sputum production; exam nonfocal  Plan -Trach placed 10/25 -Vent last used 10/27 -Sutures to be removed 11/1 -Continue Steroid Taper -Cont Bds, Claritin, singulair -PMV as tolerated - Check CXR, sputum culture, low threshold to initiate HCAP coverage - Will follow with you  Erskine Emery MD PCCM

## 2021-10-12 NOTE — Progress Notes (Signed)
PROGRESS NOTE    Maureen Jones  OMV:[id number] DOB: [birth year] DOA: 09/20/2021 PCP: Pcp, No   Brief Narrative:  55 y/o F with PMH HTN, DM who presented to [facility] on 10/10 with reports of acute altered mental status. HTN Emergency.  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 She was hypertensive with initial pressure of 240/112. Intubated.  10/12 extubated, increased WOB, re-intubated. 10/14 extubated, lingering stridor, Received racemic epi x2 + another dose of Decadron, Mild agitation requiring restart of Precedex. Did not require re-intubation, however stay complicated by continued stridor. 10/18 ENT scope > showed edema of the glottis and subglottic larynx. Continue on high dose steroids. 10/25 stridor and respiratory distress, requiring intubation and tracheostomy. Now on [employer]. PT/OT/ST following. Pulmonary will continue to follow for trach management.    Significant Hospital Events: Including procedures, antibiotic start and stop dates in addition to other pertinent events   10/10 Admit with acute encephalopathy, hypertensive 10/12 extubated, increased WOB, re-intubated 10/14 extubated , lingering stridor, Received racemic epi x2 over the last 24 hours + another dose of Decadron, Mild agitation requiring restart of Precedex 10/15 swallow evaluation >> dysphagia 3 diet 10/20 decompensated after switch to PO steroids 10/21 better after CAT and back on IV steroids 10/22 agitated back on  BIPAP 10/25 stridor again. Marked work of breathing. Intubated, airway noises, stridor and wheezing completely resolved post intubation. Spoke to ENT who will follow after trach. Trach placed by Dr Vaughan Browner. Seroquel increased to 100 qhs, changes solumedrol to prednisone to begin taper, rested on propofol over night. Tubefeeds started. RUE PICC placed  10/26 prop weaning off attempting ATC     Assessment & Plan:   Active Problems:    Encephalopathy acute   Endotracheal tube present   Acute respiratory failure (HCC)   Subglottic edema   Vocal cord dysfunction  Acute respiratory failure with hypoxia 2/2 mixed picture of VCD/post intubation subglottic and glottic edema/vocal hoarseness  +/- asthmatic exacerbation: She was Intubated upon presentation,  10/12 extubated, increased WOB, re-intubated. 10/14 extubated, lingering stridor, Received racemic epi x2 + another dose of Decadron.  Seen by ENT, had epiglottic and subglottic edema, started on tapering dose of prednisone.  Now has trach.  We will continue steroid taper dose, continue Claritin, Singulair, BDs and ICS and leave trach management per pulmonology.   Hypertensive urgency:  Initial BP 240/112, MAP 136.  Echo with moderate LVH.  Blood pressure was elevated, Imdur was added.  Blood pressure now slightly elevated but we will watch and will continue hydralazine, amlodipine and, Coreg and Imdur.  Acute Metabolic Encephalopathy due to hypertensive emergency. C/b anxiety Imaging negative for PRES. CT head negative. MRI negative. Has had sundowning at night this admission, even nights without respiratory distress, this morning, she is fully alert and oriented.  -Cont Seroquel at 100 qhs (increased 10/25) -Cont prozac (added 10/22) -Taper steroids -BID Clonazepam added 10/22 > Change to 0.5 mg PRN   CKD stage IV: Please note that previous all progress notes have indicated that patient had AKI however after chart review, it appears that since admission, patient's creatinine has remained between 2-2 0.3 with a GFR that falls in CKD stage IV, there is no previous lab available to compare creatinine with.  I believe this is her CKD stage IV.  This is at baseline.   DM with uncontrolled hyperglycemia: Blood sugar still slightly elevated on Levemir 35 units twice daily, NovoLog 4  units 3 times daily with meals and SSI.  Continue rest but increase Levemir to 40 units twice  daily.  Acute anemia due to critical illness: Hemoglobin stable around 9, transfuse if less than 7.  Dysphagia: Patient is taking good amount of p.o., evaluated by nutrition.  Plan was to remove her cortrak yesterday but she still has it.  I have personally discussed with RN to talk to nutrition so her cortrak can be removed.  DVT prophylaxis: heparin injection 5,000 Units Start: 09/20/21 0845 SCDs Start: 09/20/21 0831   Code Status: Full Code  Family Communication:  None present at bedside.  Plan of care discussed with patient in length and he verbalized understanding and agreed with it.  Status is: Inpatient  Remains inpatient appropriate because: Needs further work-up.  Medical management.  Estimated body mass index is 37.22 kg/m as calculated from the following:   Height as of this encounter: 5\' 6"  (1.676 m).   Weight as of this encounter: 104.6 kg.     Nutritional Assessment: Body mass index is 37.22 kg/m.Marland Kitchen Seen by dietician.  I agree with the assessment and plan as outlined below: Nutrition Status: Nutrition Problem: Inadequate oral intake Etiology: inability to eat Signs/Symptoms: NPO status Interventions: Ensure Enlive (each supplement provides 350kcal and 20 grams of protein), MVI  .  Skin Assessment: I have examined the patient's skin and I agree with the wound assessment as performed by the wound care RN as outlined below:    Consultants:  PCCM  Procedures:  Intubation  Antimicrobials:  Anti-infectives (From admission, onward)    Start     Dose/Rate Route Frequency Ordered Stop   10/04/21 1100  vancomycin (VANCOREADY) IVPB 1250 mg/250 mL  Status:  Discontinued        1,250 mg 166.7 mL/hr over 90 Minutes Intravenous Every 48 hours 10/02/21 0939 10/03/21 1508   10/02/21 2200  ceFEPIme (MAXIPIME) 2 g in sodium chloride 0.9 % 100 mL IVPB  Status:  Discontinued        2 g 200 mL/hr over 30 Minutes Intravenous Every 12 hours 10/02/21 0948 10/04/21 1010    10/02/21 1030  vancomycin (VANCOREADY) IVPB 2000 mg/400 mL        2,000 mg 200 mL/hr over 120 Minutes Intravenous  Once 10/02/21 0939 10/02/21 1324   09/27/21 0900  ceFEPIme (MAXIPIME) 2 g in sodium chloride 0.9 % 100 mL IVPB  Status:  Discontinued        2 g 200 mL/hr over 30 Minutes Intravenous Every 12 hours 09/27/21 0800 10/02/21 0948   09/27/21 0900  vancomycin (VANCOREADY) IVPB 1500 mg/300 mL  Status:  Discontinued        1,500 mg 150 mL/hr over 120 Minutes Intravenous Every 48 hours 09/27/21 0800 09/28/21 1058   09/21/21 0800  cefTRIAXone (ROCEPHIN) 2 g in sodium chloride 0.9 % 100 mL IVPB        2 g 200 mL/hr over 30 Minutes Intravenous Every 24 hours 09/21/21 0742 09/25/21 0808   09/20/21 0945  cefTRIAXone (ROCEPHIN) 2 g in sodium chloride 0.9 % 100 mL IVPB  Status:  Discontinued        2 g 200 mL/hr over 30 Minutes Intravenous Every 24 hours 09/20/21 0936 09/21/21 0742          Subjective:  Patient seen and examined.  She has no new complaint.  She feels tired because she could not sleep well due to cough.  She has Robitussin which helps somewhat.  Objective:  Vitals:   10/12/21 0359 10/12/21 0520 10/12/21 0742 10/12/21 0814  BP:  (!) 150/66  (!) 157/75  Pulse: 83 76  80  Resp: 19 18 16 15   Temp:      TempSrc:      SpO2: 98% 97% 96% 95%  Weight:      Height:       No intake or output data in the 24 hours ending 10/12/21 1101  Filed Weights   10/08/21 0400 10/09/21 0500 10/11/21 0500  Weight: 102.5 kg 102.5 kg 104.6 kg    Examination:  General exam: Appears calm and comfortable  Respiratory system: Clear to auscultation. Respiratory effort normal.  Trach in place Cardiovascular system: S1 & S2 heard, RRR. No JVD, murmurs, rubs, gallops or clicks. No pedal edema. Gastrointestinal system: Abdomen is nondistended, soft and nontender. No organomegaly or masses felt. Normal bowel sounds heard. Central nervous system: Alert and oriented. No focal neurological  deficits. Extremities: Symmetric 5 x 5 power. Skin: No rashes, lesions or ulcers.  Psychiatry: Judgement and insight appear normal. Mood & affect appropriate.    Data Reviewed: I have personally reviewed following labs and imaging studies  CBC: Recent Labs  Lab 10/06/21 0306 10/07/21 1149 10/08/21 0415  WBC 17.6* 20.0* 15.2*  HGB 9.6* 9.9* 8.8*  HCT 29.4* 32.0* 28.4*  MCV 92.5 93.8 93.7  PLT 221 227 505    Basic Metabolic Panel: Recent Labs  Lab 10/06/21 0053 10/06/21 0306 10/07/21 1149 10/08/21 0415 10/09/21 0356  NA  --  138 138 139 141  K 4.3 4.3 4.4 4.0 4.5  CL  --  106 104 107 107  CO2  --  25 25 27 27   GLUCOSE  --  101* 233* 167* 151*  BUN  --  56* 54* 53* 49*  CREATININE  --  2.11* 2.11* 2.18* 2.18*  CALCIUM  --  9.8 9.8 9.4 9.9  MG  --   --  1.9 1.8  --   PHOS  --   --  3.3 3.5  --     GFR: Estimated Creatinine Clearance: 36 mL/min (A) (by C-G formula based on SCr of 2.18 mg/dL (H)). Liver Function Tests: No results for input(s): AST, ALT, ALKPHOS, BILITOT, PROT, ALBUMIN in the last 168 hours. No results for input(s): LIPASE, AMYLASE in the last 168 hours. No results for input(s): AMMONIA in the last 168 hours. Coagulation Profile: No results for input(s): INR, PROTIME in the last 168 hours. Cardiac Enzymes: No results for input(s): CKTOTAL, CKMB, CKMBINDEX, TROPONINI in the last 168 hours. BNP (last 3 results) No results for input(s): PROBNP in the last 8760 hours. HbA1C: No results for input(s): HGBA1C in the last 72 hours. CBG: Recent Labs  Lab 10/11/21 1722 10/11/21 2050 10/12/21 0026 10/12/21 0443 10/12/21 0821  GLUCAP 299* 359* 294* 164* 110*    Lipid Profile: No results for input(s): CHOL, HDL, LDLCALC, TRIG, CHOLHDL, LDLDIRECT in the last 72 hours.  Thyroid Function Tests: No results for input(s): TSH, T4TOTAL, FREET4, T3FREE, THYROIDAB in the last 72 hours. Anemia Panel: No results for input(s): VITAMINB12, FOLATE, FERRITIN,  TIBC, IRON, RETICCTPCT in the last 72 hours. Sepsis Labs: No results for input(s): PROCALCITON, LATICACIDVEN in the last 168 hours.  Recent Results (from the past 240 hour(s))  Culture, Respiratory w Gram Stain     Status: None   Collection Time: 10/08/21  3:30 PM   Specimen: Sputum; Respiratory  Result Value Ref Range Status   Specimen Description SPU  Final   Special Requests NONE  Final   Gram Stain   Final    NO ORGANISMS SEEN SQUAMOUS EPITHELIAL CELLS PRESENT MODERATE WBC PRESENT,BOTH PMN AND MONONUCLEAR FEW GRAM NEGATIVE RODS RARE GRAM POSITIVE COCCI    Culture   Final    RARE Normal respiratory flora-no Staph aureus or Pseudomonas seen Performed at [facility], [place] [address] [place], [place] [address]    Report Status 10/10/2021 FINAL  Final       Radiology Studies: No results found.  Scheduled Meds:  amLODipine  10 mg Per Tube Daily   arformoterol  15 mcg Nebulization BID   budesonide (PULMICORT) nebulizer solution  0.25 mg Nebulization BID   carvedilol  50 mg Per Tube BID WC   chlorhexidine  15 mL Mouth Rinse BID   Chlorhexidine Gluconate Cloth  6 each Topical Daily   docusate  100 mg Per Tube BID   feeding supplement  237 mL Oral BID BM   feeding supplement (PROSource TF)  45 mL Per Tube BID   FLUoxetine  10 mg Per Tube Daily   fluticasone  2 spray Each Nare Daily   heparin  5,000 Units Subcutaneous Q8H   hydrALAZINE  100 mg Per Tube Q8H   insulin aspart  0-20 Units Subcutaneous TID WC   insulin aspart  0-5 Units Subcutaneous QHS   insulin aspart  4 Units Subcutaneous TID WC   insulin detemir  40 Units Subcutaneous BID   isosorbide mononitrate  15 mg Oral Daily   loratadine  10 mg Per Tube Daily   mouth rinse  15 mL Mouth Rinse q12n4p   montelukast  10 mg Per Tube QHS   pantoprazole sodium  40 mg Per Tube Daily   polyethylene glycol  17 g Per Tube Daily   predniSONE  10 mg Per Tube Q breakfast   QUEtiapine  100 mg Per Tube QHS   sodium  chloride flush  10-40 mL Intracatheter Q12H   Continuous Infusions:     LOS: 22 days   Time spent: 26 minutes   Darliss Cheney, MD [facility]  10/12/2021, 11:01 AM  Please page via [facility] and do not message via secure chat for anything urgent. Secure chat can be used for anything non urgent.  How to contact the [facility] Attending or Consulting provider [place] or covering provider during after hours [place], for this patient?  Check the care team in [facility] and look for a) attending/consulting [facility] provider listed and b) the [facility] team listed. Page or secure chat [phone]. Log into [email or web address] and use [place]'s universal password to access. If you do not have the password, please contact the hospital operator. Locate the [facility] provider you are looking for under [facility] and page to a number that you can be directly reached. If you still have difficulty reaching the provider, please page the [facility] (Director on Call) for the Hospitalists listed on amion for assistance.

## 2021-10-12 NOTE — Progress Notes (Signed)
Trach sutures were removed without complication.

## 2021-10-12 NOTE — Progress Notes (Signed)
PT Cancellation Note  Patient Details Name: [name] MRN: [id number] DOB: 1966-01-09   Cancelled Treatment:    Reason Eval/Treat Not Completed: Patient declined, no reason specified (pt reports fatigue and nausea unable to currently participate)   Kayle Correa B Tekeshia Klahr 10/12/2021, 11:16 AM Bayard Males, PT Acute Rehabilitation Services Pager: [phone] Office: [phone]

## 2021-10-12 NOTE — Progress Notes (Signed)
Called by RN to assess pt. Pt kept having desaturation periods so RT placed pt back on 5L/28% trach collar. Inner cannula was changed and suctioned. Pt claims that she is "anxious" and continues to have RR in  high 20s. RN made aware.

## 2021-10-13 ENCOUNTER — Inpatient Hospital Stay (HOSPITAL_COMMUNITY): Payer: Medicare Other

## 2021-10-13 DIAGNOSIS — Z978 Presence of other specified devices: Secondary | ICD-10-CM | POA: Diagnosis not present

## 2021-10-13 DIAGNOSIS — J9600 Acute respiratory failure, unspecified whether with hypoxia or hypercapnia: Secondary | ICD-10-CM | POA: Diagnosis not present

## 2021-10-13 DIAGNOSIS — I469 Cardiac arrest, cause unspecified: Secondary | ICD-10-CM

## 2021-10-13 LAB — POCT I-STAT 7, (LYTES, BLD GAS, ICA,H+H)
Acid-Base Excess: 4 mmol/L — ABNORMAL HIGH (ref 0.0–2.0)
Bicarbonate: 28.7 mmol/L — ABNORMAL HIGH (ref 20.0–28.0)
Calcium, Ion: 1.29 mmol/L (ref 1.15–1.40)
HCT: 23 % — ABNORMAL LOW (ref 36.0–46.0)
Hemoglobin: 7.8 g/dL — ABNORMAL LOW (ref 12.0–15.0)
O2 Saturation: 100 %
Patient temperature: 98.3
Potassium: 4.8 mmol/L (ref 3.5–5.1)
Sodium: 134 mmol/L — ABNORMAL LOW (ref 135–145)
TCO2: 30 mmol/L (ref 22–32)
pCO2 arterial: 45.9 mmHg (ref 32.0–48.0)
pH, Arterial: 7.404 (ref 7.350–7.450)
pO2, Arterial: 174 mmHg — ABNORMAL HIGH (ref 83.0–108.0)

## 2021-10-13 LAB — COMPREHENSIVE METABOLIC PANEL
ALT: 17 U/L (ref 0–44)
AST: 12 U/L — ABNORMAL LOW (ref 15–41)
Albumin: 2.2 g/dL — ABNORMAL LOW (ref 3.5–5.0)
Alkaline Phosphatase: 90 U/L (ref 38–126)
Anion gap: 9 (ref 5–15)
BUN: 52 mg/dL — ABNORMAL HIGH (ref 6–20)
CO2: 27 mmol/L (ref 22–32)
Calcium: 9.8 mg/dL (ref 8.9–10.3)
Chloride: 100 mmol/L (ref 98–111)
Creatinine, Ser: 2.33 mg/dL — ABNORMAL HIGH (ref 0.44–1.00)
GFR, Estimated: 24 mL/min — ABNORMAL LOW (ref >60)
Glucose, Bld: 65 mg/dL — ABNORMAL LOW (ref 70–99)
Potassium: 4.4 mmol/L (ref 3.5–5.1)
Sodium: 136 mmol/L (ref 135–145)
Total Bilirubin: 0.5 mg/dL (ref 0.3–1.2)
Total Protein: 6.2 g/dL — ABNORMAL LOW (ref 6.5–8.1)

## 2021-10-13 LAB — GLUCOSE, CAPILLARY
Glucose-Capillary: 105 mg/dL — ABNORMAL HIGH (ref 70–99)
Glucose-Capillary: 121 mg/dL — ABNORMAL HIGH (ref 70–99)
Glucose-Capillary: 123 mg/dL — ABNORMAL HIGH (ref 70–99)
Glucose-Capillary: 133 mg/dL — ABNORMAL HIGH (ref 70–99)
Glucose-Capillary: 188 mg/dL — ABNORMAL HIGH (ref 70–99)
Glucose-Capillary: 58 mg/dL — ABNORMAL LOW (ref 70–99)
Glucose-Capillary: 63 mg/dL — ABNORMAL LOW (ref 70–99)
Glucose-Capillary: 64 mg/dL — ABNORMAL LOW (ref 70–99)
Glucose-Capillary: 88 mg/dL (ref 70–99)

## 2021-10-13 LAB — TROPONIN I (HIGH SENSITIVITY)
Troponin I (High Sensitivity): 27 ng/L — ABNORMAL HIGH
Troponin I (High Sensitivity): 55 ng/L — ABNORMAL HIGH (ref <18)

## 2021-10-13 LAB — CBC
HCT: 26.6 % — ABNORMAL LOW (ref 36.0–46.0)
HCT: 27.5 % — ABNORMAL LOW (ref 36.0–46.0)
Hemoglobin: 8.1 g/dL — ABNORMAL LOW (ref 12.0–15.0)
Hemoglobin: 8.3 g/dL — ABNORMAL LOW (ref 12.0–15.0)
MCH: 28.4 pg (ref 26.0–34.0)
MCH: 28.5 pg (ref 26.0–34.0)
MCHC: 30.5 g/dL (ref 30.0–36.0)
MCHC: 30.2 g/dL (ref 30.0–36.0)
MCV: 93.3 fL (ref 80.0–100.0)
MCV: 94.5 fL (ref 80.0–100.0)
Platelets: 178 10*3/uL (ref 150–400)
Platelets: 188 10*3/uL (ref 150–400)
RBC: 2.85 MIL/uL — ABNORMAL LOW (ref 3.87–5.11)
RBC: 2.91 MIL/uL — ABNORMAL LOW (ref 3.87–5.11)
RDW: 13.7 % (ref 11.5–15.5)
RDW: 13.7 % (ref 11.5–15.5)
WBC: 16.3 10*3/uL — ABNORMAL HIGH (ref 4.0–10.5)
WBC: 19 10*3/uL — ABNORMAL HIGH (ref 4.0–10.5)
nRBC: 0 % (ref 0.0–0.2)
nRBC: 0 % (ref 0.0–0.2)

## 2021-10-13 LAB — BASIC METABOLIC PANEL WITH GFR
Anion gap: 9 (ref 5–15)
BUN: 52 mg/dL — ABNORMAL HIGH (ref 6–20)
CO2: 27 mmol/L (ref 22–32)
Calcium: 9.5 mg/dL (ref 8.9–10.3)
Chloride: 98 mmol/L (ref 98–111)
Creatinine, Ser: 2.41 mg/dL — ABNORMAL HIGH (ref 0.44–1.00)
GFR, Estimated: 23 mL/min — ABNORMAL LOW
Glucose, Bld: 162 mg/dL — ABNORMAL HIGH (ref 70–99)
Potassium: 4.8 mmol/L (ref 3.5–5.1)
Sodium: 134 mmol/L — ABNORMAL LOW (ref 135–145)

## 2021-10-13 LAB — LACTIC ACID, PLASMA
Lactic Acid, Venous: 2 mmol/L (ref 0.5–1.9)
Lactic Acid, Venous: 0.8 mmol/L (ref 0.5–1.9)

## 2021-10-13 LAB — MAGNESIUM: Magnesium: 1.9 mg/dL (ref 1.7–2.4)

## 2021-10-13 MED ORDER — ORAL CARE MOUTH RINSE
15.0000 mL | OROMUCOSAL | Status: DC
  Administered 2021-10-13 – 2021-10-18 (×49): 15 mL via OROMUCOSAL

## 2021-10-13 MED ORDER — ROCURONIUM BROMIDE 10 MG/ML (PF) SYRINGE
PREFILLED_SYRINGE | INTRAVENOUS | Status: AC
  Filled 2021-10-13: qty 10

## 2021-10-13 MED ORDER — FLUOXETINE HCL 20 MG PO CAPS: 20.0000 mg | ORAL_CAPSULE | Freq: Every day | ORAL | Status: DC

## 2021-10-13 MED ORDER — FENTANYL CITRATE (PF) 100 MCG/2ML IJ SOLN
50.0000 ug | Freq: Once | INTRAMUSCULAR | Status: AC
  Administered 2021-10-13: 50 ug via INTRAVENOUS

## 2021-10-13 MED ORDER — PANTOPRAZOLE SODIUM 40 MG IV SOLR
40.0000 mg | Freq: Every day | INTRAVENOUS | Status: DC
  Administered 2021-10-14 – 2021-10-16 (×3): 40 mg via INTRAVENOUS
  Filled 2021-10-13 (×3): qty 40

## 2021-10-13 MED ORDER — CLONAZEPAM 0.5 MG PO TABS
0.5000 mg | ORAL_TABLET | Freq: Two times a day (BID) | ORAL | Status: DC
  Administered 2021-10-13 – 2021-10-16 (×6): 0.5 mg
  Filled 2021-10-13 (×6): qty 1

## 2021-10-13 MED ORDER — FENTANYL CITRATE (PF) 100 MCG/2ML IJ SOLN
INTRAMUSCULAR | Status: AC
  Filled 2021-10-13: qty 2

## 2021-10-13 MED ORDER — SUCCINYLCHOLINE CHLORIDE 200 MG/10ML IV SOSY
PREFILLED_SYRINGE | INTRAVENOUS | Status: AC
  Filled 2021-10-13: qty 10

## 2021-10-13 MED ORDER — ETOMIDATE 2 MG/ML IV SOLN: 20.0000 mg | INTRAVENOUS | Status: AC

## 2021-10-13 MED ORDER — CLONAZEPAM 0.5 MG PO TABS
0.5000 mg | ORAL_TABLET | Freq: Three times a day (TID) | ORAL | Status: DC | PRN
  Administered 2021-10-15 – 2021-10-19 (×3): 0.5 mg
  Filled 2021-10-13 (×3): qty 1

## 2021-10-13 MED ORDER — NOREPINEPHRINE 4 MG/250ML-% IV SOLN: 0.0000 ug/min | INTRAVENOUS | Status: DC

## 2021-10-13 MED ORDER — KETAMINE HCL 50 MG/5ML IJ SOSY
PREFILLED_SYRINGE | INTRAMUSCULAR | Status: AC
  Filled 2021-10-13: qty 5

## 2021-10-13 MED ORDER — PROPOFOL 1000 MG/100ML IV EMUL
INTRAVENOUS | Status: AC
  Filled 2021-10-13: qty 100

## 2021-10-13 MED ORDER — FENTANYL BOLUS VIA INFUSION
50.0000 ug | INTRAVENOUS | Status: DC | PRN
  Filled 2021-10-13: qty 100

## 2021-10-13 MED ORDER — FLUOXETINE HCL 20 MG/5ML PO SOLN
20.0000 mg | Freq: Every day | ORAL | Status: DC
  Administered 2021-10-14 – 2021-10-19 (×6): 20 mg
  Filled 2021-10-13 (×9): qty 5

## 2021-10-13 MED ORDER — CHLORHEXIDINE GLUCONATE 0.12% ORAL RINSE (MEDLINE KIT)
15.0000 mL | Freq: Two times a day (BID) | OROMUCOSAL | Status: DC
  Administered 2021-10-13 – 2021-10-18 (×11): 15 mL via OROMUCOSAL

## 2021-10-13 MED ORDER — FENTANYL 2500MCG IN NS 250ML (10MCG/ML) PREMIX INFUSION
50.0000 ug/h | INTRAVENOUS | Status: DC
  Administered 2021-10-13: 100 ug/h via INTRAVENOUS
  Administered 2021-10-14: 200 ug/h via INTRAVENOUS
  Filled 2021-10-13 (×2): qty 250

## 2021-10-13 MED ORDER — MIDAZOLAM HCL 2 MG/2ML IJ SOLN
INTRAMUSCULAR | Status: AC
  Filled 2021-10-13: qty 2

## 2021-10-13 MED ORDER — ENSURE ENLIVE PO LIQD
237.0000 mL | Freq: Three times a day (TID) | ORAL | Status: DC
Start: 2021-10-13 — End: 2021-10-14

## 2021-10-13 MED ORDER — DEXTROSE-NACL 5-0.9 % IV SOLN: INTRAVENOUS | Status: DC

## 2021-10-13 MED ORDER — INSULIN DETEMIR 100 UNIT/ML ~~LOC~~ SOLN
32.0000 [IU] | Freq: Two times a day (BID) | SUBCUTANEOUS | Status: DC
Start: 2021-10-13 — End: 2021-10-13
  Administered 2021-10-13: 32 [IU] via SUBCUTANEOUS
  Filled 2021-10-13 (×3): qty 0.32

## 2021-10-13 MED ORDER — ETOMIDATE 2 MG/ML IV SOLN
INTRAVENOUS | Status: AC
  Filled 2021-10-13: qty 20

## 2021-10-13 MED ORDER — ROCURONIUM BROMIDE 50 MG/5ML IV SOLN
50.0000 mg | INTRAVENOUS | Status: AC
  Administered 2021-10-13: 50 mg via INTRAVENOUS

## 2021-10-13 MED ORDER — PROPOFOL 1000 MG/100ML IV EMUL
0.0000 ug/kg/min | INTRAVENOUS | Status: DC
  Administered 2021-10-13: 18 ug/kg/min via INTRAVENOUS
  Administered 2021-10-13: 10 ug/kg/min via INTRAVENOUS
  Administered 2021-10-14: 18 ug/kg/min via INTRAVENOUS
  Filled 2021-10-13 (×2): qty 100

## 2021-10-13 MED ORDER — ETOMIDATE 2 MG/ML IV SOLN
INTRAVENOUS | Status: AC
  Administered 2021-10-13: 20 mg via INTRAVENOUS
  Filled 2021-10-13: qty 20

## 2021-10-13 MED ORDER — INSULIN ASPART 100 UNIT/ML IJ SOLN
0.0000 [IU] | INTRAMUSCULAR | Status: DC
  Administered 2021-10-14 – 2021-10-15 (×4): 3 [IU] via SUBCUTANEOUS
  Administered 2021-10-15 (×2): 4 [IU] via SUBCUTANEOUS
  Administered 2021-10-15: 7 [IU] via SUBCUTANEOUS
  Administered 2021-10-15: 4 [IU] via SUBCUTANEOUS
  Administered 2021-10-15: 7 [IU] via SUBCUTANEOUS
  Administered 2021-10-15: 3 [IU] via SUBCUTANEOUS
  Administered 2021-10-16: 4 [IU] via SUBCUTANEOUS
  Administered 2021-10-16: 7 [IU] via SUBCUTANEOUS
  Administered 2021-10-16: 11 [IU] via SUBCUTANEOUS
  Administered 2021-10-16 (×3): 4 [IU] via SUBCUTANEOUS
  Administered 2021-10-17: 7 [IU] via SUBCUTANEOUS
  Administered 2021-10-17: 11 [IU] via SUBCUTANEOUS
  Administered 2021-10-17: 4 [IU] via SUBCUTANEOUS
  Administered 2021-10-17 (×2): 11 [IU] via SUBCUTANEOUS
  Administered 2021-10-17 – 2021-10-18 (×2): 7 [IU] via SUBCUTANEOUS
  Administered 2021-10-18 (×3): 4 [IU] via SUBCUTANEOUS
  Administered 2021-10-18 (×2): 7 [IU] via SUBCUTANEOUS
  Administered 2021-10-19: 11 [IU] via SUBCUTANEOUS
  Administered 2021-10-19 (×2): 7 [IU] via SUBCUTANEOUS
  Administered 2021-10-19: 4 [IU] via SUBCUTANEOUS
  Administered 2021-10-19: 7 [IU] via SUBCUTANEOUS
  Administered 2021-10-20 (×6): 4 [IU] via SUBCUTANEOUS
  Administered 2021-10-20 – 2021-10-22 (×9): 3 [IU] via SUBCUTANEOUS

## 2021-10-13 NOTE — Progress Notes (Signed)
Upon entering the room, Pt noted to be in distress, labored breathing, accessory muscle use, chest asymmetrical. While engaging conversation with pt, she grabbed my hands, squeezed it and said "help me". At this time there was no palpable pulse. A code was called and compressions started at approximately 11:12 am.  MD and pulmonologist was notified, pt then transferred to [facility]. On call [facility] notified pts father. Bedside report given to ICU RN.

## 2021-10-13 NOTE — Progress Notes (Signed)
Nutrition Follow-up  DOCUMENTATION CODES:   Obesity unspecified  INTERVENTION:  -d/c Cortrak per MD -d/c Magic cup po BID with meals, each supplement provides 290 kcal and 9 grams of protein -Ensure Enlive po TID, each supplement provides 350 kcal and 20 grams of protein  NUTRITION DIAGNOSIS:  Inadequate oral intake related to inability to eat as evidenced by NPO status. Progressing, pt on PO diet  GOAL:  Patient will meet greater than or equal to 90% of their needs progressing  MONITOR:   PO intake, I & O's, Weight trends, Supplement acceptance  REASON FOR ASSESSMENT:   Consult, Ventilator Enteral/tube feeding initiation and management  ASSESSMENT:   Pt with PMH of DM, HTN, and smoking admitted with acute encephalopathy.  10/10 - admit, intubated 10/12 - extubated and re-intubated 10/14 - extubated  10/15 - dysphagia 3 diet s/p swallow eval 10/22 - returned to BiPAP 10/25 - re-intubated, trach 10/26 - Cortrak placed (gastric tip confirmed via xray)  Pt c/o some difficulty breathing and feels it is r/t her Cortrak tube. MD wanting to remove tube today. Discussed pt with RN who reports pt with ~25% breakfast intake this morning, but feels her decreased intake is related to the breathing difficulties/tube. RN reports pt drank Ensure without problem. Pt agreeable to continue receiving/drinking Ensures. Agree with plan to remove Cortrak at this time. RD to monitor for adequacy of PO intake,   No PO intake documented since 10/28. 50-100% meal completion x 8 recorded meals (~91% average meal intake)  Medications: Ensure Enlive/Plus BID, SSI TID w/ meals and bedtime, 4 units Novolog TID w/ meals, colace, 32 units BID levemir, protonix, miralax, deltasone Labs: BUN 52 (H), Cr 2.33 (H) CBGs: 62-188 x24 hours  UOP: 323ml x24 hours I/O: -2863ml since admit  Admit weight: 100 kg Current weight: 104.6 kg  Diet Order:   Diet Order             Diet heart healthy/carb  modified Room service appropriate? No; Fluid consistency: Thin  Diet effective now                   EDUCATION NEEDS:   Not appropriate for education at this time  Skin:  Skin Assessment: Reviewed RN Assessment  Last BM:  11/01  Height:   Ht Readings from Last 1 Encounters:  10/05/21 5\' 6"  (1.676 m)    Weight:   Wt Readings from Last 1 Encounters:  10/11/21 104.6 kg    Ideal Body Weight:  59.1 kg  BMI:  Body mass index is 37.22 kg/m.  Estimated Nutritional Needs:   Kcal:  1900-2100  Protein:  >/= 115 gm  Fluid:  >2L    Larkin Ina, MS, RD, LDN (she/her/hers) RD pager number and weekend/on-call pager number located in [place].

## 2021-10-13 NOTE — Progress Notes (Signed)
OT Cancellation Note  Patient Details Name: Maureen Jones MRN: [id number] DOB: [birth year]   Cancelled Treatment:    Reason Eval/Treat Not Completed: Medical issues which prohibited therapy- pt with code blue and transitioned to ICU.  Will follow and see as able/medically appropriate.   Jolaine Artist, OT Acute Rehabilitation Services Pager [phone] Office [phone]   Delight Stare 10/13/2021, 11:59 AM

## 2021-10-13 NOTE — Progress Notes (Signed)
   10/13/21 1100  Clinical Encounter Type  Visited With Patient not available;Health care provider  Visit Type Initial;Code    CH responded to code blue on 2W; pt. being attended by medical team when [facility] arrived at 11:25, appearing to have regained a pulse.  AC contacted pt.'s father; RN will update father and sister.  No further needs evident at this time but chaplains remain available as needed.  Lindaann Pascal, Chaplain Pager: [phone]

## 2021-10-13 NOTE — Procedures (Signed)
Percutaneous Tracheostomy Procedure Note   Issabela Jones  [id number]  [birth year]  Date:10/13/21  Time:2:43 PM   Provider Performing:Jalana Moore Cipriano Mile w/ bronchoscopic guidance by Wandra Arthurs NP  Procedure: Percutaneous Tracheostomy with Bronchoscopic Guidance (45364)  Indication(s) Tracheal stomal failure  Consent Emergent  Anesthesia Etomidate, Versed, Fentanyl, Rocuronium   Time Out Verified patient identification, verified procedure, site/side was marked, verified correct patient position, special equipment/implants available, medications/allergies/relevant history reviewed, required imaging and test results available.   Sterile Technique Maximal sterile technique including sterile barrier drape, hand hygiene, sterile gown, sterile gloves, mask, hair covering.    Procedure Description Appropriate anatomy identified by palpation.  Patient's neck prepped and draped in sterile fashion.  1% lidocaine with epinephrine was used to anesthetize skin overlying neck.  Existing stoma palpated and carefully interrogated with soft catheter, kept going into false passage.  Decision then to redo tracheostomy so using bronchoscopic guidance, guide needle placed into anterior trachea and using seldinger technique with sequential dilation a distal size 6 XLT was advanced into the airway with placement confirmed by bronchoscopy to be ~4 cm above carina.  New tracheostomy tube secured with ties; tight fit into stoma so do not think needs sutures at present.   Complications/Tolerance None; patient tolerated the procedure well. Chest X-ray is ordered to confirm no post-procedural complication.   EBL Minimal   Specimen(s) None

## 2021-10-13 NOTE — Code Documentation (Signed)
CODE BLUE NOTE  Patient Name: Maureen Jones   MRN: [id number]   Date of Birth/ Sex: [birth year] , female      Admission Date: 09/20/2021  Attending Provider: Candee Furbish, MD  Primary Diagnosis: <principal problem not specified>     Indication: Pt was in her usual state of health until this AM, when she was noted to be dyspneic attempting to get back from the restroom. See prior code documentation.  Code blue was subsequently called. At the time of arrival on scene, ACLS protocol was underway.  She arrested a second time (which I was present for) during transport.     Technical Description:  - CPR performance duration:  3 minutes  - Was defibrillation or cardioversion used? No   - Was external pacer placed? No  - Was patient intubated pre/post CPR? Trach in place, in false track. Replaced, see separate note.     Medications Administered: Y = Yes; Blank = No Amiodarone    Atropine    Calcium    Epinephrine    Lidocaine    Magnesium    Norepinephrine    Phenylephrine    Sodium bicarbonate    Vasopressin      Post CPR evaluation:  - Final Status - Was patient successfully resuscitated ? Yes - What is current rhythm? NSR - What is current hemodynamic status? Hemodynamically stable.    Miscellaneous Information:  - Labs sent, including: CBC, BMP, lactic acid, troponin.   - Primary team notified?  Yes  - Family Notified? Yes.  Per [facility] Sherle Poe.   - Additional notes/ transfer status: See additional documentation in separate progress note.       Noe Gens, MSN, APRN, NP-C, AGACNP-BC [facility] 10/13/2021, 12:54 PM   Please see [email or web address] for pager details.   From 7A-7P if no response, please call [phone] After hours, please call ELink [phone]

## 2021-10-13 NOTE — Progress Notes (Signed)
Date and time results received: 10/13/21 1536   Test: troponin Critical Value: 25  Name of Provider Notified: Hillery Aldo, NP  Orders Received? Or Actions Taken?:  No new order given.

## 2021-10-13 NOTE — Code Documentation (Signed)
  Patient Name: Maureen Jones   MRN: [id number]   Date of Birth/ Sex: Aug 24, 1966 , female      Admission Date: 09/20/2021  Attending Provider: Antonieta Pert, MD  Primary Diagnosis: <principal problem not specified>    Indication: Pt was in her usual state of health until this AM, when she was noted to be in PEA arrest. Code blue was subsequently called. At the time of arrival on scene, ACLS protocol was underway.   Technical Description:  - CPR performance duration:  7-10 minutes  - Was defibrillation or cardioversion used? No   - Was external pacer placed? No  - Was patient intubated pre/post CPR? Has trach in place   Medications Administered: Y = Yes; Blank = No Amiodarone    Atropine    Calcium    Epinephrine  Y  Lidocaine    Magnesium    Norepinephrine    Phenylephrine    Sodium bicarbonate    Vasopressin     Post CPR evaluation:  - Final Status - Was patient successfully resuscitated ? Yes - What is current rhythm? Sinus tachycardia - What is current hemodynamic status? Bp stable, no pressors required  Miscellaneous Information:  - Labs sent, including: CBC, BMP, Troponins, Lactic acid, ABG  - Primary team notified?  Yes, present in room  - Family Notified? Father notified  - Additional notes/ transfer status: PCCM consulted and assumed care of the patient. Patient being transferred to [facility], [place], DO  10/13/2021, 11:27 AM

## 2021-10-13 NOTE — Progress Notes (Signed)
Called to room via secure chat for patient "passing out".  Was reportedly walking back from the restroom and was short of breath.  She collapsed and had to be put back in bed by staff.  After episode she was reportedly labored with respiratory distress.  RT was called and patient was difficult to bag.  End tidal CO2 was intermittently positive worrisome for dislodged trach.  Upon my arrival to room, the patient was post CPR from 1112 to 1119 am.  She received multiple rounds of CPR, epinephrine before ROSC.  The patient was emergently moved to the ICU.  She lost pulses in transport, CPR was initiated with ROSC.  Bronch was performed per Dr. Tamala Julian, trach in a false trach. Trach removed, ETT placed in stoma, placement verified with bronch.  Patient intubated from above with boogie and ETT passed over to allow for trach revision.  Placement verified with bronchoscopy.    Noe Gens, MSN, APRN, NP-C, AGACNP-BC [facility] 10/13/2021, 12:46 PM   Please see [email or web address] for pager details.   From 7A-7P if no response, please call [phone] After hours, please call ELink [phone]

## 2021-10-13 NOTE — Progress Notes (Addendum)
[facility] Progress Note Patient Name: Maureen Jones DOB: 12/21/1965 MRN: [id number]   Date of Service  10/13/2021  HPI/Events of Note  Notified of hypoglycemia with last POC glucose at 58.   eICU Interventions  Hold all insulin.  Start on D5NS@50cc /hr.      Intervention Category Minor Interventions: Other:  Maureen Jones 10/13/2021, 9:05 PM  12:30 AM POC glucose at 63 despite D5NS.  Bladder scan also showing 275cc of urine.   Plan> Increase D5NS to 100cc/hr.  Straight cath x 1.   1:26 AM Notified of fever with temp 102.103F.  CXR done earlier showed perihilar and bibasilar airspace opacities.   Plan> Obtain blood cultures.  Start on zosyn.  Tylenol PR.

## 2021-10-13 NOTE — Procedures (Signed)
Intubation Procedure Note  Orva Riles  [id number]  [birth year]  Date:10/13/21  Time:2:38 PM   Provider Performing:Nesa Distel C Tamala Julian    Procedure: Intubation (76720)  Indication(s) Respiratory Failure  Consent Unable to obtain consent due to emergent nature of procedure.   Anesthesia None code blue   Time Out Verified patient identification, verified procedure, site/side was marked, verified correct patient position, special equipment/implants available, medications/allergies/relevant history reviewed, required imaging and test results available.   Sterile Technique Usual hand hygeine, masks, and gloves were used   Procedure Description Patient positioned in bed supine.  Sedation given as noted above.  Patient was intubated through tracheal stoma peri-code with some resistance; location confirmed post with bronchoscope, patient attached to ventilator with improvement in sats, ROSC.  Complications/Tolerance See progress note 11/2 by Gwyndolyn Saxon Minor Chest X-ray is ordered to verify placement.   EBL Minimal   Specimen(s) None

## 2021-10-13 NOTE — Progress Notes (Signed)
   NAMEQuynh Jones, MRN:  [id number], DOB:  02-28-1966, LOS: 28 ADMISSION DATE:  09/20/2021, CONSULTATION DATE: 10/10 REFERRING MD:  Dr. Laverta Baltimore, CHIEF COMPLAINT:  AMS   History of Present Illness:  55 y/o F who presented to [facility] on 10/10 with reports of acute altered mental status secondary to hypertensive emergency with hypoxic respiratory failure related to VCD/post intubation subglottic and glottic edema/vocal hoarseness  +/- asthmatic exacerbation.  Ultimately required trach placement.   Pertinent  Medical History  HTN DM   Significant Hospital Events: Including procedures, antibiotic start and stop dates in addition to other pertinent events   10/10 Admit with acute encephalopathy, hypertensive 10/12 extubated, increased WOB, re-intubated 10/14 extubated , lingering stridor, Received racemic epi x2 over the last 24 hours + another dose of Decadron, Mild agitation requiring restart of Precedex 10/15 swallow evaluation >> dysphagia 3 diet 10/20 decompensated after switch to PO steroids 10/21 better after CAT and back on IV steroids 10/22 agitated back on  BIPAP 10/25 stridor again. Marked work of breathing. Intubated, airway noises, stridor and wheezing completely resolved post intubation. Spoke to ENT who will follow after trach. Trach placed by Dr Vaughan Browner. Seroquel increased to 100 qhs, changes solumedrol to prednisone to begin taper, rested on propofol over night. Tubefeeds started. RUE PICC placed  10/26 prop weaning off attempting ATC   Interim History / Subjective:  CXR benign Sputum culture pending She is resting comfortably this am, thinks breathing is improved  Objective   Blood pressure 122/75, pulse 74, temperature 98.6 F (37 C), temperature source Oral, resp. rate 13, height 5\' 6"  (1.676 m), weight 104.6 kg, SpO2 98 %.    FiO2 (%):  [20 %-40 %] 35 %   Intake/Output Summary (Last 24 hours) at 10/13/2021 5852 Last data filed at 10/13/2021 0505 Gross per 24 hour   Intake --  Output 300 ml  Net -300 ml    Filed Weights   10/08/21 0400 10/09/21 0500 10/11/21 0500  Weight: 102.5 kg 102.5 kg 104.6 kg    Examination: Laying in bed no distress Scattered transmitted upper airway sounds this am more than wheezing No edema Moves all 4 ext Fair insight but anxious  Resolved Hospital Problem list   RML/RLL PNA (treated) Hypertensive emergency  Assessment & Plan:   Acute respiratory failure with hypoxia 2/2 mixed picture of VCD/post intubation subglottic and glottic edema/vocal hoarseness  +/- asthmatic exacerbation.  -Wheezing back with new sputum production; exam nonfocal  Plan -Trach placed 10/25 -Vent last used 10/27 -Sutures are removed -Continue Steroid Taper -Cont Bds, Claritin, singulair -PMV as tolerated - f/u sputum culture - increase prozac, trial of standing klonipin with PRNs as I think much her decompensation during day is due to anxiety - RT will reach out to me if any further decompensation, appreciate help  Erskine Emery MD PCCM

## 2021-10-13 NOTE — Progress Notes (Signed)
NAMEMosella Jones, MRN:  [id number], DOB:  12-16-1965, LOS: 78 ADMISSION DATE:  09/20/2021, CONSULTATION DATE: 10/10 REFERRING MD:  Dr. Laverta Baltimore, CHIEF COMPLAINT:  AMS   History of Present Illness:  55 y/o F who presented to [facility] on 10/10 with reports of acute altered mental status secondary to hypertensive emergency with hypoxic respiratory failure related to VCD/post intubation subglottic and glottic edema/vocal hoarseness  +/- asthmatic exacerbation.  Ultimately required trach placement.   Pertinent  Medical History  HTN DM   Significant Hospital Events: Including procedures, antibiotic start and stop dates in addition to other pertinent events   10/10 Admit with acute encephalopathy, hypertensive 10/12 extubated, increased WOB, re-intubated 10/14 extubated , lingering stridor, Received racemic epi x2 over the last 24 hours + another dose of Decadron, Mild agitation requiring restart of Precedex 10/15 swallow evaluation >> dysphagia 3 diet 10/20 decompensated after switch to PO steroids 10/21 better after CAT and back on IV steroids 10/22 agitated back on  BIPAP 10/25 stridor again. Marked work of breathing. Intubated, airway noises, stridor and wheezing completely resolved post intubation. Spoke to ENT who will follow after trach. Trach placed by Dr Vaughan Browner. Seroquel increased to 100 qhs, changes solumedrol to prednisone to begin taper, rested on propofol over night. Tubefeeds started. RUE PICC placed  10/26 prop weaning off attempting ATC  PEA arrest in setting of dislodged tracheostomy" questionable syncope.  10/13/2021 10/13/2021 transferred to intensive care unit intubated via trach stoma for redo trach 10/13/2020 10/13/2021 redo tracheostomy  Interim History / Subjective:  CXR benign Sputum culture pending She is resting comfortably this am, thinks breathing is improved  Objective   Blood pressure 139/76, pulse 75, temperature 98.7 F (37.1 C), resp. rate 16, height 5\' 6"   (1.676 m), weight 104.6 kg, SpO2 99 %.    FiO2 (%):  [20 %-40 %] 35 %   Intake/Output Summary (Last 24 hours) at 10/13/2021 1147 Last data filed at 10/13/2021 0505 Gross per 24 hour  Intake --  Output 300 ml  Net -300 ml   Filed Weights   10/08/21 0400 10/09/21 0500 10/11/21 0500  Weight: 102.5 kg 102.5 kg 104.6 kg    Examination: Intubated sedated on full mechanical dilatory support following dislodged tracheostomy Endotracheal tube currently in stoma with plan to do a redo tracheostomy today Heart sounds are regular at this point.  She had been in PEA arrest later Coarse rhonchi bilaterally Abdomen is obese soft nontender Right PICC line is unremarkable Positive edema bilateral lower extremity  Resolved Hospital Problem list   RML/RLL PNA (treated) Hypertensive emergency  Assessment & Plan:   Acute respiratory failure with hypoxia 2/2 mixed picture of VCD/post intubation subglottic and glottic edema/vocal hoarseness  +/- asthmatic exacerbation.  -Wheezing back with new sputum production; exam nonfocal  Plan 10/13/2021 status post PEA arrest in the setting of questionable syncope followed by a fall and dislodgment of tracheostomy.  Note she just had a core track true removed for discomfort.  She suffered an arrest required tracheostomy being removed and endotracheal tube being placed via the stoma with fiberoptic bronchoscopy to confirm position of endotracheal tube She will need a revision of her tracheostomy in the near future. Resume full ventilatory support Sedate as needed Continue bronchodilators We will repeat a sputum culture since she has been intubated. Holding sedation medication at this time until we have ascertained her true neurological status Stat portable chest x-ray of chest Transferred to the intensive care unit. See code note per  Dr. Tamala Julian  Status post PEA arrest Monitor in intensive care See above  Chronic kidney disease Lab Results  Component  Value Date   CREATININE 2.33 (H) 10/13/2021   CREATININE 2.18 (H) 10/09/2021   CREATININE 2.18 (H) 10/08/2021   Continue to monitor  Right middle lobe right lower lobe pneumonia  antibiotic iotics have been completed Weanable culture  Diabetes mellitus CBG (last 3)  Recent Labs    10/13/21 0503 10/13/21 0830 10/13/21 1114  GLUCAP 88 64* 123*   Sliding-scale insulin protocol  Anemia Recent Labs    10/13/21 0827  HGB 8.1*   Transfuse per protocol  Nutrition Place OG tube

## 2021-10-13 NOTE — Progress Notes (Signed)
Post trach ABG obtained on ventilator settings of VT: 470, RR: 24, FIO2: 100%, and PEEP: 10.  Decreased FIO2 to 70% and will continue to wean as tolerated.   Ref. Range 10/13/2021 14:29  Sample type Unknown ARTERIAL  pH, Arterial Latest Ref Range: 7.350 - 7.450  7.404  pCO2 arterial Latest Ref Range: 32.0 - 48.0 mmHg 45.9  pO2, Arterial Latest Ref Range: 83.0 - 108.0 mmHg 174 (H)  TCO2 Latest Ref Range: 22 - 32 mmol/L 30  Acid-Base Excess Latest Ref Range: 0.0 - 2.0 mmol/L 4.0 (H)  Bicarbonate Latest Ref Range: 20.0 - 28.0 mmol/L 28.7 (H)  O2 Saturation Latest Units: % 100.0  Patient temperature Unknown 98.3 F  Collection site Unknown RADIAL, ALLEN'S TEST ACCEPTABLE

## 2021-10-13 NOTE — Progress Notes (Signed)
   10/13/21 1137  Clinical Encounter Type  Visited With Patient not available  Visit Type Code  Referral From Nurse  Consult/Referral To Chaplain   Chaplain responded to code. The patient is being attended to by the medical team. There are no support person present. This Chaplain remains available for follow-up spiritual/emotional support as needed. This note was prepared by Jeanine Luz, M.Div..  For questions please contact by phone [phone].

## 2021-10-13 NOTE — Progress Notes (Addendum)
PROGRESS NOTE    Maureen Jones  SNK:[id number] DOB: [birth year] DOA: 09/20/2021 PCP: Pcp, No   Chief Complaint  Patient presents with   Code Stroke  Brief Narrative/Hospital Course:  Maureen Jones, 55 y.o. female with PMH of  HTN, DM who presented to [facility] on 09/20/21  with reports of acute altered mental status,HTN Emergency. S required intubation and extubation twice, had glottic and subglottic edema diagnosed by ENT and managed with  tapering dose of steroids.  Again on 10/25  had stridor and respiratory distress, requiring intubation and tracheostomy placed by Dr. Vaughan Browner, She is onw Trach Collar. PT/OT/ST following.  11/2 transferred to medical floor and pulmonary will continue to follow for trach management.   Subjective: Overnight patient with respiratory distress but with a lot of anxiety issues. This a.m. was seen sitting on the edge of the bed trying to eat her breakfast.  She complains of difficulty with breathing due to her ng tube/feeding tube in nose  it has not been used for few days.  She complains of her sinus and throat bothering.  Assessment & Plan:  Acute respiratory failure with hypoxia secondary to mixed picture obesity/postintubation subglottic and glottic edema/vocal hoarseness +/- Asthmatic exacerbation: Initially needed intubation in the setting of acute encephalopathy and hypertensive emergency.  Now on trach since 10/25 off pain since 10/27, sutures are removed, she is on prednisone taper, bronchodilators Claritin and Singulair PMV as tolerated, being managed by pulmonary critical care.  Sputum culture with GNR and GPC follow-up.  Anxiety episode 11/1 night after suspecting causing worsening respiratory status.  Trial of standing Klonopin with as needed's and increased Prozac as per pulmonary.  Hopefully can get tube feeding out this am that will help her breathe better and she reports it is bothering her.  Hypertensive urgency on admission  in 240s: Bpessure  is now improving continue with multiple regimen including Imdur, hydralazine, Coreg, amlodipine  Encephalopathy acute due to hypertensive emergency: She is alert awake oriented communicative interactive. patient needing anxiety medication monitor respiratory status closely on Prozac Seroquel Klonopin.  She underwent extensive work-up with CT head, MRI brain and unremarkable.  CKD stage IV-chart review creatinine has remained between 2-2.3.  Previously documented as AKI, but no prior baseline, we believe she probably has CKD stage IV.  Monitor bmp. Labs repeated. Recent Labs  Lab 10/07/21 1149 10/08/21 0415 10/09/21 0356 10/13/21 0827  BUN 54* 53* 49* 52*  CREATININE 2.11* 2.18* 2.18* 2.33*    RML/RLL pneumonia: Treated and completed antibiotic.  Follow-up culture sputum  Diabetes with uncontrolled hyperglycemia: On NovoLog 4 units 3 times daily and Levemir 40 units twice daily- had hypoglycemia 11/1 noon in 62- I cut back Levemir to 32 units bid 11/2-cbg was low in lab draw this am - insulin was held, she was asymptomatic. Recent Labs  Lab 10/12/21 1619 10/12/21 2055 10/13/21 0038 10/13/21 0503 10/13/21 0830  GLUCAP 86 104* 188* 88 64*   Acute anemia due to critical illness monitor hemoglobin transfuse if less than 7 g Recent Labs  Lab 10/07/21 1149 10/08/21 0415 10/13/21 0827  HGB 9.9* 8.8* 8.1*  HCT 32.0* 28.4* 26.6*    Dysphagia diet as tolerated RD following.  She still has cortrack tube feeding in place, discussed with Dr. Tamala Julian, not being used, patient is endorsing discomfort respiratory difficulties, will remove it this am.    Class II Obesity:Patient's Body mass index is 37.22 kg/m. : Will benefit with PCP follow-up, weight loss  healthy lifestyle and outpatient  sleep evaluation.  Addendum at 11: 40 am: Informed by the nursing that patient "passed out"  did not fall, RN at bedside- had PEA arrest - CODE BLUE activated underwent CPR for 7 to 10 minutes with ROSC.   Difficulty with bagging concern about airway issues married bronchoscopy.  ICU team also came at the bedside and she is being taken back to ICU.  BP stable and in sinus tachycardic. Stat labs CBC CMP treatments lactic acid ABG being sent take. Further plan per pulmonary critical care team ?bronch.  DVT prophylaxis: heparin injection 5,000 Units Start: 09/20/21 0845 SCDs Start: 09/20/21 0831 Code Status:   Code Status: Full Code Family Communication: plan of care discussed with patient at bedside. Status is: Inpatient  Remains inpatient appropriate because: For ongoing management of her respiratory failure trach management.  Objective: Vitals last 24 hrs: Vitals:   10/13/21 0800 10/13/21 0806 10/13/21 0814 10/13/21 0827  BP: 139/76  139/76   Pulse:  74 75   Resp: 16     Temp:    98.7 F (37.1 C)  TempSrc:      SpO2: 99%     Weight:      Height:       Weight change:   Intake/Output Summary (Last 24 hours) at 10/13/2021 0926 Last data filed at 10/13/2021 0505 Gross per 24 hour  Intake --  Output 300 ml  Net -300 ml   Net IO Since Admission: -2,872.61 mL [10/13/21 0926]   Physical Examination: General exam: Aa0x3, obese,, weak,older than stated age. HEENT:Oral mucosa moist, Ear/Nose WNL grossly,dentition normal. Respiratory system: B/l clear BS, no use of accessory muscle, non tender. Cortrak++ Cardiovascular system: S1 & S2 +,No JVD. Gastrointestinal system: Abdomen soft, NT,ND, BS+. Nervous System:Alert, awake, moving extremities. Extremities: edema none, distal peripheral pulses palpable.  Skin: No rashes, no icterus. MSK: Normal muscle bulk, tone, power.  Medications reviewed:  Scheduled Meds:  amLODipine  10 mg Per Tube Daily   arformoterol  15 mcg Nebulization BID   budesonide (PULMICORT) nebulizer solution  0.25 mg Nebulization BID   carvedilol  50 mg Per Tube BID WC   chlorhexidine  15 mL Mouth Rinse BID   Chlorhexidine Gluconate Cloth  6 each Topical Daily    clonazePAM  0.5 mg Per Tube BID   docusate  100 mg Per Tube BID   feeding supplement  237 mL Oral BID BM   feeding supplement (PROSource TF)  45 mL Per Tube BID   FLUoxetine  20 mg Per Tube Daily   fluticasone  2 spray Each Nare Daily   heparin  5,000 Units Subcutaneous Q8H   hydrALAZINE  100 mg Per Tube Q8H   insulin aspart  0-20 Units Subcutaneous TID WC   insulin aspart  0-5 Units Subcutaneous QHS   insulin aspart  4 Units Subcutaneous TID WC   insulin detemir  32 Units Subcutaneous BID   isosorbide mononitrate  15 mg Oral Daily   loratadine  10 mg Per Tube Daily   mouth rinse  15 mL Mouth Rinse q12n4p   montelukast  10 mg Per Tube QHS   pantoprazole sodium  40 mg Per Tube Daily   polyethylene glycol  17 g Per Tube Daily   predniSONE  10 mg Per Tube Q breakfast   QUEtiapine  100 mg Per Tube QHS   sodium chloride flush  10-40 mL Intracatheter Q12H   Continuous Infusions:  Diet Order  Diet heart healthy/carb modified Room service appropriate? No; Fluid consistency: Thin  Diet effective now                   Nutrition Problem: Inadequate oral intake Etiology: inability to eat Signs/Symptoms: NPO status Interventions: Ensure Enlive (each supplement provides 350kcal and 20 grams of protein), MVI  Weight change:   Wt Readings from Last 3 Encounters:  10/11/21 104.6 kg     Consultants:see note  Procedures:see note Antimicrobials: Anti-infectives (From admission, onward)    Start     Dose/Rate Route Frequency Ordered Stop   10/04/21 1100  vancomycin (VANCOREADY) IVPB 1250 mg/250 mL  Status:  Discontinued        1,250 mg 166.7 mL/hr over 90 Minutes Intravenous Every 48 hours 10/02/21 0939 10/03/21 1508   10/02/21 2200  ceFEPIme (MAXIPIME) 2 g in sodium chloride 0.9 % 100 mL IVPB  Status:  Discontinued        2 g 200 mL/hr over 30 Minutes Intravenous Every 12 hours 10/02/21 0948 10/04/21 1010   10/02/21 1030  vancomycin (VANCOREADY) IVPB 2000 mg/400 mL         2,000 mg 200 mL/hr over 120 Minutes Intravenous  Once 10/02/21 0939 10/02/21 1324   09/27/21 0900  ceFEPIme (MAXIPIME) 2 g in sodium chloride 0.9 % 100 mL IVPB  Status:  Discontinued        2 g 200 mL/hr over 30 Minutes Intravenous Every 12 hours 09/27/21 0800 10/02/21 0948   09/27/21 0900  vancomycin (VANCOREADY) IVPB 1500 mg/300 mL  Status:  Discontinued        1,500 mg 150 mL/hr over 120 Minutes Intravenous Every 48 hours 09/27/21 0800 09/28/21 1058   09/21/21 0800  cefTRIAXone (ROCEPHIN) 2 g in sodium chloride 0.9 % 100 mL IVPB        2 g 200 mL/hr over 30 Minutes Intravenous Every 24 hours 09/21/21 0742 09/25/21 0808   09/20/21 0945  cefTRIAXone (ROCEPHIN) 2 g in sodium chloride 0.9 % 100 mL IVPB  Status:  Discontinued        2 g 200 mL/hr over 30 Minutes Intravenous Every 24 hours 09/20/21 0936 09/21/21 0742      Culture/Microbiology    Component Value Date/Time   SDES EXPECTORATED SPUTUM 10/12/2021 1359   SDES EXPECTORATED SPUTUM 10/12/2021 1359   SPECREQUEST NONE 10/12/2021 1359   SPECREQUEST NONE Reflexed from [id number] 10/12/2021 1359   CULT  10/12/2021 1359    TOO YOUNG TO READ Performed at [facility], [place] [address] [place], [place] [address]    REPTSTATUS 10/12/2021 FINAL 10/12/2021 1359   REPTSTATUS PENDING 10/12/2021 1359    Other culture-see note  Unresulted Labs (From admission, onward)     Start     Ordered   10/14/21 6761  Basic metabolic panel  Daily,   R     Question:  Specimen collection method  Answer:  Unit=Unit collect   10/13/21 0748   10/14/21 0500  CBC  Daily,   R     Question:  Specimen collection method  Answer:  Unit=Unit collect   10/13/21 0748          Data Reviewed: I have personally reviewed following labs and imaging studies CBC: Recent Labs  Lab 10/07/21 1149 10/08/21 0415 10/13/21 0827  WBC 20.0* 15.2* 16.3*  HGB 9.9* 8.8* 8.1*  HCT 32.0* 28.4* 26.6*  MCV 93.8 93.7 93.3  PLT 227 182 950   Basic Metabolic  Panel:  Recent Labs  Lab 10/07/21 1149 10/08/21 0415 10/09/21 0356 10/13/21 0827  NA 138 139 141 136  K 4.4 4.0 4.5 4.4  CL 104 107 107 100  CO2 25 27 27 27   GLUCOSE 233* 167* 151* 65*  BUN 54* 53* 49* 52*  CREATININE 2.11* 2.18* 2.18* 2.33*  CALCIUM 9.8 9.4 9.9 9.8  MG 1.9 1.8  --  1.9  PHOS 3.3 3.5  --   --    GFR: Estimated Creatinine Clearance: 33.7 mL/min (A) (by C-G formula based on SCr of 2.33 mg/dL (H)). Liver Function Tests: Recent Labs  Lab 10/13/21 0827  AST 12*  ALT 17  ALKPHOS 90  BILITOT 0.5  PROT 6.2*  ALBUMIN 2.2*   No results for input(s): LIPASE, AMYLASE in the last 168 hours. No results for input(s): AMMONIA in the last 168 hours. Coagulation Profile: No results for input(s): INR, PROTIME in the last 168 hours. Cardiac Enzymes: No results for input(s): CKTOTAL, CKMB, CKMBINDEX, TROPONINI in the last 168 hours. BNP (last 3 results) No results for input(s): PROBNP in the last 8760 hours. HbA1C: No results for input(s): HGBA1C in the last 72 hours. CBG: Recent Labs  Lab 10/12/21 1619 10/12/21 2055 10/13/21 0038 10/13/21 0503 10/13/21 0830  GLUCAP 86 104* 188* 88 64*   Lipid Profile: No results for input(s): CHOL, HDL, LDLCALC, TRIG, CHOLHDL, LDLDIRECT in the last 72 hours. Thyroid Function Tests: No results for input(s): TSH, T4TOTAL, FREET4, T3FREE, THYROIDAB in the last 72 hours. Anemia Panel: No results for input(s): VITAMINB12, FOLATE, FERRITIN, TIBC, IRON, RETICCTPCT in the last 72 hours. Sepsis Labs: No results for input(s): PROCALCITON, LATICACIDVEN in the last 168 hours.  Recent Results (from the past 240 hour(s))  Culture, Respiratory w Gram Stain     Status: None   Collection Time: 10/08/21  3:30 PM   Specimen: Sputum; Respiratory  Result Value Ref Range Status   Specimen Description SPU  Final   Special Requests NONE  Final   Gram Stain   Final    NO ORGANISMS SEEN SQUAMOUS EPITHELIAL CELLS PRESENT MODERATE WBC  PRESENT,BOTH PMN AND MONONUCLEAR FEW GRAM NEGATIVE RODS RARE GRAM POSITIVE COCCI    Culture   Final    RARE Normal respiratory flora-no Staph aureus or Pseudomonas seen Performed at [facility] [address], [place], [place] [address]    Report Status 10/10/2021 FINAL  Final  Expectorated Sputum Assessment w Gram Stain, Rflx to Resp Cult     Status: None   Collection Time: 10/12/21  1:59 PM   Specimen: Expectorated Sputum  Result Value Ref Range Status   Specimen Description EXPECTORATED SPUTUM  Final   Special Requests NONE  Final   Sputum evaluation   Final    THIS SPECIMEN IS ACCEPTABLE FOR SPUTUM CULTURE Performed at [facility], [place] [address], [place], [place] [address]    Report Status 10/12/2021 FINAL  Final  Culture, Respiratory w Gram Stain     Status: None (Preliminary result)   Collection Time: 10/12/21  1:59 PM  Result Value Ref Range Status   Specimen Description EXPECTORATED SPUTUM  Final   Special Requests NONE Reflexed from [id number]  Final   Gram Stain   Final    FEW WBC PRESENT, PREDOMINANTLY PMN FEW GRAM NEGATIVE RODS FEW GRAM POSITIVE COCCI IN PAIRS AND CHAINS    Culture   Final    TOO YOUNG TO READ Performed at [facility], [place]  [address], [place], [place] [address]    Report Status PENDING  Incomplete     Radiology Studies: DG Chest 1 View  Result Date: 10/12/2021 CLINICAL DATA:  Pneumonia EXAM: CHEST  1 VIEW COMPARISON:  10/05/2021 FINDINGS: Tracheostomy device is present. Enteric tube partially imaged. Right PICC line is present with tip overlying SVC. Low lung volumes. No definite new consolidation. No pleural effusion. Similar cardiomediastinal contours. IMPRESSION: No evidence of pneumonia. Electronically Signed   By: Macy Mis M.D.   On: 10/12/2021 14:06     LOS: 23 days   Antonieta Pert, MD [facility]  10/13/2021, 9:26 AM

## 2021-10-13 NOTE — Procedures (Signed)
Intubation Procedure Note  Maureen Jones  [id number]  07-09-1966  Date:10/13/21  Time:12:55 PM   Provider Performing:Ajeenah Heiny Alfredo Martinez, NP-C, AGACNP-BC   Procedure: Intubation (31500)  Indication(s) Respiratory Failure  Consent Unable to obtain consent due to emergent nature of procedure.   Anesthesia Etomidate and Rocuronium - given after ETT secured in stoma and verified with bronchoscopy.    Time Out Verified patient identification, verified procedure, site/side was marked, verified correct patient position, special equipment/implants available, medications/allergies/relevant history reviewed, required imaging and test results available.   Sterile Technique Usual hand hygeine, masks, and gloves were used   Procedure Description Patient positioned in bed supine.  Sedation given as noted above.  Patient was intubated with endotracheal tube using Glidescope.  View was Grade 1 full glottis .  Number of attempts was 1.  Colorimetric CO2 detector was consistent with tracheal placement.   Complications/Tolerance None; patient tolerated the procedure well. Chest X-ray is ordered to verify placement.   EBL Minimal   Specimen(s) None    Maureen Gens, MSN, APRN, NP-C, AGACNP-BC [facility] 10/13/2021, 12:56 PM   Please see [email or web address] for pager details.   From 7A-7P if no response, please call [phone] After hours, please call ELink [phone]

## 2021-10-13 NOTE — Procedures (Signed)
Intubation Procedure Note  Maureen Jones  [id number]  01-Jan-1966  Date:10/13/21  Time:2:40 PM   Provider Performing:Brandi Alfredo Martinez NP and Candee Furbish MD   Procedure: Intubation (88337)  Indication(s) Respiratory Failure  Consent Unable to obtain consent due to emergent nature of procedure.   Anesthesia Etomidate and Rocuronium   Time Out Verified patient identification, verified procedure, site/side was marked, verified correct patient position, special equipment/implants available, medications/allergies/relevant history reviewed, required imaging and test results available.   Sterile Technique Usual hand hygeine, masks, and gloves were used   Procedure Description Patient positioned in bed supine.  Sedation given as noted above.  Patient was intubated with endotracheal tube using Glidescope.  View was Grade 2 only posterior commissure .  Due to amount of glottic swelling Ollis held glidescope while Tamala Julian advance bougee through cords using cricoid pressure to make open airway less anterior.  Bronch through ETT post c/w tracheal placement so ETT in tracheal stoma removed.   Complications/Tolerance See progress note 11/2 by Gwyndolyn Saxon Minor Chest X-ray is ordered to verify placement.   EBL Minimal   Specimen(s) None

## 2021-10-13 NOTE — Procedures (Addendum)
Bronchoscopy Procedure Note  Maureen Jones  [id number]  06-Mar-1966  Date:10/13/21  Time:2:34 PM   Provider Performing:Jesseka Drinkard C Asmara Backs   Procedure(s):  Flexible Bronchoscopy [phone])  Indication(s) Occluded tracheostomy tube  Consent Unable to obtain consent due to emergent nature of procedure.  Anesthesia None, code blue   Time Out Verified patient identification, verified procedure, site/side was marked, verified correct patient position, special equipment/implants available, medications/allergies/relevant history reviewed, required imaging and test results available.   Sterile Technique Usual hand hygiene, masks, gowns, and gloves were used   Procedure Description Bronchoscope advanced through airway stoma and directly into soft tissue, no airway visualized.    Complications/Tolerance See progress note 11/2 by Gwyndolyn Saxon Minor Chest X-ray is not needed post procedure.   EBL Minimal   Specimen(s) none

## 2021-10-13 NOTE — Progress Notes (Signed)
SLP Cancellation Note  Patient Details Name: Minela Bridgewater MRN: [id number] DOB: Jan 07, 1966   Cancelled treatment:       Reason Eval/Treat Not Completed: Patient not medically ready (Pt had code blue today, has been transferred to ICU and is currently on the vent. SLP will follow up on subsequent date.)  Shakthi Scipio I. Hardin Negus, [place], [place] Office number [phone] Pager [place] 10/13/2021, 3:53 PM

## 2021-10-14 ENCOUNTER — Inpatient Hospital Stay (HOSPITAL_COMMUNITY): Payer: Medicare Other

## 2021-10-14 DIAGNOSIS — J9601 Acute respiratory failure with hypoxia: Secondary | ICD-10-CM | POA: Diagnosis not present

## 2021-10-14 DIAGNOSIS — Y95 Nosocomial condition: Secondary | ICD-10-CM

## 2021-10-14 DIAGNOSIS — J189 Pneumonia, unspecified organism: Secondary | ICD-10-CM | POA: Diagnosis not present

## 2021-10-14 DIAGNOSIS — N179 Acute kidney failure, unspecified: Secondary | ICD-10-CM | POA: Diagnosis not present

## 2021-10-14 DIAGNOSIS — I469 Cardiac arrest, cause unspecified: Secondary | ICD-10-CM | POA: Diagnosis not present

## 2021-10-14 LAB — GLUCOSE, CAPILLARY
Glucose-Capillary: 117 mg/dL — ABNORMAL HIGH (ref 70–99)
Glucose-Capillary: 119 mg/dL — ABNORMAL HIGH (ref 70–99)
Glucose-Capillary: 132 mg/dL — ABNORMAL HIGH (ref 70–99)
Glucose-Capillary: 143 mg/dL — ABNORMAL HIGH (ref 70–99)
Glucose-Capillary: 147 mg/dL — ABNORMAL HIGH (ref 70–99)
Glucose-Capillary: 159 mg/dL — ABNORMAL HIGH (ref 70–99)

## 2021-10-14 LAB — BASIC METABOLIC PANEL
Anion gap: 7 (ref 5–15)
BUN: 54 mg/dL — ABNORMAL HIGH (ref 6–20)
CO2: 26 mmol/L (ref 22–32)
Calcium: 9.1 mg/dL (ref 8.9–10.3)
Chloride: 103 mmol/L (ref 98–111)
Creatinine, Ser: 2.75 mg/dL — ABNORMAL HIGH (ref 0.44–1.00)
GFR, Estimated: 20 mL/min — ABNORMAL LOW (ref >60)
Glucose, Bld: 134 mg/dL — ABNORMAL HIGH (ref 70–99)
Potassium: 4.8 mmol/L (ref 3.5–5.1)
Sodium: 136 mmol/L (ref 135–145)

## 2021-10-14 LAB — MRSA NEXT GEN BY PCR, NASAL: MRSA by PCR Next Gen: NOT DETECTED

## 2021-10-14 LAB — CBC
HCT: 23.3 % — ABNORMAL LOW (ref 36.0–46.0)
Hemoglobin: 7.2 g/dL — ABNORMAL LOW (ref 12.0–15.0)
MCH: 28.8 pg (ref 26.0–34.0)
MCHC: 30.9 g/dL (ref 30.0–36.0)
MCV: 93.2 fL (ref 80.0–100.0)
Platelets: 161 10*3/uL (ref 150–400)
RBC: 2.5 MIL/uL — ABNORMAL LOW (ref 3.87–5.11)
RDW: 14 % (ref 11.5–15.5)
WBC: 13.1 10*3/uL — ABNORMAL HIGH (ref 4.0–10.5)
nRBC: 0 % (ref 0.0–0.2)

## 2021-10-14 LAB — URINALYSIS, ROUTINE W REFLEX MICROSCOPIC
Bilirubin Urine: NEGATIVE
Glucose, UA: 50 mg/dL — AB
Ketones, ur: NEGATIVE mg/dL
Leukocytes,Ua: NEGATIVE
Nitrite: NEGATIVE
Protein, ur: 100 mg/dL — AB
Specific Gravity, Urine: 1.016 (ref 1.005–1.030)
pH: 5 (ref 5.0–8.0)

## 2021-10-14 LAB — PHOSPHORUS
Phosphorus: 4.5 mg/dL (ref 2.5–4.6)
Phosphorus: 4.7 mg/dL — ABNORMAL HIGH (ref 2.5–4.6)

## 2021-10-14 LAB — TRIGLYCERIDES: Triglycerides: 212 mg/dL — ABNORMAL HIGH (ref <150)

## 2021-10-14 LAB — MAGNESIUM
Magnesium: 1.8 mg/dL (ref 1.7–2.4)
Magnesium: 1.9 mg/dL (ref 1.7–2.4)

## 2021-10-14 LAB — TROPONIN I (HIGH SENSITIVITY): Troponin I (High Sensitivity): 203 ng/L (ref <18)

## 2021-10-14 MED ORDER — FENTANYL CITRATE (PF) 100 MCG/2ML IJ SOLN
50.0000 ug | INTRAMUSCULAR | Status: DC | PRN
  Administered 2021-10-14 – 2021-10-16 (×4): 100 ug via INTRAVENOUS
  Filled 2021-10-14 (×4): qty 2

## 2021-10-14 MED ORDER — PIPERACILLIN-TAZOBACTAM 3.375 G IVPB 30 MIN
3.3750 g | Freq: Once | INTRAVENOUS | Status: AC
  Administered 2021-10-14: 3.375 g via INTRAVENOUS
  Filled 2021-10-14 (×2): qty 50

## 2021-10-14 MED ORDER — SODIUM CHLORIDE 0.9 % IV SOLN
2.0000 g | INTRAVENOUS | Status: DC
  Administered 2021-10-14 – 2021-10-17 (×4): 2 g via INTRAVENOUS
  Filled 2021-10-14 (×3): qty 2

## 2021-10-14 MED ORDER — PROSOURCE TF PO LIQD
45.0000 mL | Freq: Three times a day (TID) | ORAL | Status: DC
  Administered 2021-10-14 – 2021-10-19 (×17): 45 mL
  Filled 2021-10-14 (×17): qty 45

## 2021-10-14 MED ORDER — ACETAMINOPHEN 650 MG RE SUPP
650.0000 mg | RECTAL | Status: DC | PRN
  Administered 2021-10-14: 650 mg via RECTAL
  Filled 2021-10-14: qty 1

## 2021-10-14 MED ORDER — FENTANYL 2500MCG IN NS 250ML (10MCG/ML) PREMIX INFUSION
0.0000 ug/h | INTRAVENOUS | Status: AC
  Filled 2021-10-14: qty 250

## 2021-10-14 MED ORDER — NITROGLYCERIN 0.4 MG SL SUBL: 0.4000 mg | SUBLINGUAL_TABLET | SUBLINGUAL | Status: DC | PRN

## 2021-10-14 MED ORDER — DEXMEDETOMIDINE HCL IN NACL 400 MCG/100ML IV SOLN
0.0000 ug/kg/h | INTRAVENOUS | Status: DC
  Administered 2021-10-14: 0.5 ug/kg/h via INTRAVENOUS
  Administered 2021-10-14: 0.4 ug/kg/h via INTRAVENOUS
  Administered 2021-10-15: 0.5 ug/kg/h via INTRAVENOUS
  Administered 2021-10-15: 0.7 ug/kg/h via INTRAVENOUS
  Administered 2021-10-15: 0.3 ug/kg/h via INTRAVENOUS
  Administered 2021-10-16: 0.5 ug/kg/h via INTRAVENOUS
  Administered 2021-10-16 (×2): 0.6 ug/kg/h via INTRAVENOUS
  Administered 2021-10-17: 0.7 ug/kg/h via INTRAVENOUS
  Administered 2021-10-17: 0.5 ug/kg/h via INTRAVENOUS
  Filled 2021-10-14 (×8): qty 100

## 2021-10-14 MED ORDER — ASPIRIN 81 MG PO CHEW
81.0000 mg | CHEWABLE_TABLET | Freq: Every day | ORAL | Status: DC
Start: 2021-10-15 — End: 2021-10-20
  Administered 2021-10-15 – 2021-10-19 (×6): 81 mg
  Filled 2021-10-14 (×7): qty 1

## 2021-10-14 MED ORDER — VITAL 1.5 CAL PO LIQD
1000.0000 mL | ORAL | Status: DC
  Administered 2021-10-14 – 2021-10-18 (×6): 1000 mL
  Filled 2021-10-14 (×2): qty 1000

## 2021-10-14 MED ORDER — INSULIN DETEMIR 100 UNIT/ML ~~LOC~~ SOLN
15.0000 [IU] | Freq: Two times a day (BID) | SUBCUTANEOUS | Status: DC
  Administered 2021-10-14 – 2021-10-15 (×2): 15 [IU] via SUBCUTANEOUS
  Filled 2021-10-14 (×3): qty 0.15

## 2021-10-14 MED FILL — Medication: Qty: 2 | Status: AC

## 2021-10-14 NOTE — Progress Notes (Signed)
[facility] Progress Note Patient Name: Maureen Jones DOB: [birth year] MRN: [id number]   Date of Service  10/14/2021  HPI/Events of Note  Troponin #1 = 203.   eICU Interventions  Plan: ASA 81 mg per tube now and Q day.  Continue to trend Troponin,     Intervention Category Major Interventions: Other:  Lysle Dingwall 10/14/2021, 11:42 PM

## 2021-10-14 NOTE — Progress Notes (Signed)
Date and time results received: 10/14/21 2246   Test: troponin Critical Value: 203  Name of Provider Notified: Oletta Darter, MD  Orders Received? Or Actions Taken?:  awaiting orders, continue to monitor

## 2021-10-14 NOTE — Evaluation (Signed)
Physical Therapy Evaluation Patient Details Name: Maureen Jones MRN: [id number] DOB: 1966-09-16 Today's Date: 10/14/2021  History of Present Illness  55 y.o. female admitted 09/20/21 with AMS. 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 CXR with low lung volumes, L>R interstitial opacities. Acute metabolic encephalopathy in setting of HTN urgency, possible PRES. ETT (10/10-10/12, 10/12-10/14). Course complicated by confusion/agitation overnight 10/17. 10/25 trach and bronch with return to vent.  11/2 pt passed out while returning from the bathroom.  Once in bed experiencing respiratory distress leading to PEA arrest with CPR x2 for 10 min.  Transferred to ICU on vent.   PMHx:HTN, DM.  Clinical Impression  Due to lethargy and soreness from CPR, pt has lost a little ground from the last few days.  Expect her to "bounce" back and continue to progress well.  Emphasis today on warm up exercise,        Recommendations for follow up therapy are one component of a multi-disciplinary discharge planning process, led by the attending physician.  Recommendations may be updated based on patient status, additional functional criteria and insurance authorization.  Follow Up Recommendations Home health PT (if pt can bounce back from this episode, TBD)    Assistance Recommended at Discharge Intermittent Supervision/Assistance  Functional Status Assessment Patient has had a recent decline in their functional status and demonstrates the ability to make significant improvements in function in a reasonable and predictable amount of time.  Equipment Recommendations  Rolling walker (2 wheels);3in1 (PT)    Recommendations for Other Services Rehab consult     Precautions / Restrictions Precautions Precautions: Fall Precaution Comments: trach, cortrak, picc      Mobility  Bed Mobility Overal bed mobility: Needs Assistance Bed Mobility: Supine to Sit;Sit to Supine     Supine to sit: Min assist;+2 for  physical assistance Sit to supine: Mod assist;+2 for physical assistance   General bed mobility comments: cues for direction, truncal assist/support while pt getting UE's in position to assist    Transfers Overall transfer level: Needs assistance Equipment used: Rolling walker (2 wheels);None Transfers: Sit to/from Stand Sit to Stand: Min assist;+2 physical assistance           General transfer comment: cues for hand placement and stability assist    Ambulation/Gait Ambulation/Gait assistance: Mod assist Gait Distance (Feet): 3 Feet Assistive device: None Gait Pattern/deviations: Step-to pattern     General Gait Details: face to face assist to step up to [facility] with unsteady steps suspect due mostly from lethargy, but also due to some weakness  Stairs            Wheelchair Mobility    Modified Rankin (Stroke Patients Only) Modified Rankin (Stroke Patients Only) Modified Rankin: Moderately severe disability     Balance Overall balance assessment: Needs assistance Sitting-balance support: No upper extremity supported;Feet supported Sitting balance-[name]: Poor Sitting balance - Comments: listing forward mostly due to lethargy and "nodding off"   Standing balance support: Single extremity supported;Bilateral upper extremity supported;During functional activity Standing balance-[name]: Poor Standing balance comment: reliant on AD or externa support                             Pertinent Vitals/Pain Pain Assessment: Faces Faces Pain Scale: Hurts little more Pain Location: chest Pain Descriptors / Indicators: Sore Pain Intervention(s): Monitored during session    Home Living  Prior Function                       Hand Dominance   Dominant Hand: Right    Extremity/Trunk Assessment   Upper Extremity Assessment Upper Extremity Assessment: Generalized weakness    Lower Extremity  Assessment Lower Extremity Assessment: Generalized weakness (able to easy bear weight, but difficulty taking steps)       Communication   Communication: Tracheostomy  Cognition Arousal/Alertness: Lethargic Behavior During Therapy: WFL for tasks assessed/performed Overall Cognitive Status: Difficult to assess                                          General Comments General comments (skin integrity, edema, etc.): SpO2  92-95% on 60% FiO2 and 8 PEEP, HR at 77 bpm with BP sitting 101/55 (69)  RR low 20's rpm    Exercises Other Exercises Other Exercises: bil hip/knee flex/ext with graded resistance x10   Assessment/Plan    PT Assessment Patient needs continued PT services  PT Problem List Decreased activity tolerance;Decreased balance;Decreased mobility;Decreased coordination;Decreased cognition;Decreased knowledge of use of DME;Decreased safety awareness;Cardiopulmonary status limiting activity;Impaired sensation       PT Treatment Interventions DME instruction;Gait training;Stair training;Functional mobility training;Therapeutic activities;Therapeutic exercise;Balance training;Neuromuscular re-education;Cognitive remediation;Patient/family education    PT Goals (Current goals can be found in the Care Plan section)  Acute Rehab PT Goals Patient Stated Goal: to breathe better PT Goal Formulation: With patient Time For Goal Achievement: 10/09/21 Potential to Achieve Goals: Good    Frequency Min 3X/week   Barriers to discharge        Co-evaluation PT/OT/SLP Co-Evaluation/Treatment: Yes Reason for Co-Treatment: For patient/therapist safety;Other (comment) (newly transferred to ICU) PT goals addressed during session: Mobility/safety with mobility;Strengthening/ROM OT goals addressed during session: ADL's and self-care;Strengthening/ROM       AM-PAC PT "6 Clicks" Mobility  Outcome Measure Help needed turning from your back to your side while in a flat bed  without using bedrails?: A Little Help needed moving from lying on your back to sitting on the side of a flat bed without using bedrails?: A Little Help needed moving to and from a bed to a chair (including a wheelchair)?: A Lot Help needed standing up from a chair using your arms (e.g., wheelchair or bedside chair)?: A Little Help needed to walk in hospital room?: A Lot Help needed climbing 3-5 steps with a railing? : A Lot 6 Click Score: 15    End of Session Equipment Utilized During Treatment: Oxygen Activity Tolerance: Patient tolerated treatment well;Patient limited by lethargy Patient left: in bed;with call bell/phone within reach;with nursing/sitter in room Nurse Communication: Mobility status PT Visit Diagnosis: Other abnormalities of gait and mobility (R26.89);Difficulty in walking, not elsewhere classified (R26.2)    Time: 5366-4403 PT Time Calculation (min) (ACUTE ONLY): 24 min   Charges:   PT Evaluation $PT Re-evaluation: 1 Re-eval          10/14/2021  Ginger Carne., PT Acute Rehabilitation Services [phone]  (pager) [phone]  (office)  Tessie Fass Kimmberly Wisser 10/14/2021, 12:25 PM

## 2021-10-14 NOTE — Progress Notes (Signed)
SLP Cancellation Note  Patient Details Name: Maureen Jones MRN: [id number] DOB: June 05, 1966   Cancelled treatment:       Reason Eval/Treat Not Completed: Patient not medically ready (Pt remains on the vent at this time and RN reports that it is unlikely that she will be weaned today. SLP will follow up on subsequent date)  Shalah Estelle I. Hardin Negus, [place], [place] Office number [phone] Pager [place] 10/14/2021, 9:13 AM

## 2021-10-14 NOTE — Progress Notes (Addendum)
Occupational Therapy Re-eval  Patient Details Name: Maureen Jones MRN: [id number] DOB: 05-23-1966 Today's Date: 10/14/2021   History of present illness 55 y.o. female admitted 09/20/21 with AMS. 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 CXR with low lung volumes, L>R interstitial opacities. Acute metabolic encephalopathy in setting of HTN urgency, possible PRES. ETT (10/10-10/12, 10/12-10/14). Course complicated by confusion/agitation overnight 10/17. 10/25 trach and bronch with return to vent.  11/2 pt passed out while returning from the bathroom.  Once in bed experiencing respiratory distress from disloged trach leading to PEA arrest with CPR x2 for 10 min. Transferred to ICU on vent.  Trach revised 11/2.  PMHx:HTN, DM.   OT comments  Pt transferred back to ICU after dislodged trach and PEA arrest.  Patient currently requires min-mod assist +2, EOB sitting max assist for oral care, total assist for LB ADLs and min assist +2 for sit to stand. She is lethargic, but following simple commands with increased time.  Fatigues easily. Anticipate she will progress well.  Will follow acutely.   Pt on vent via trach at PEEP 8, FiO2 60%, SpO2 92-95%, RR low 20s.  HR 70-80, BP 101/55 (69)   Recommendations for follow up therapy are one component of a multi-disciplinary discharge planning process, led by the attending physician.  Recommendations may be updated based on patient status, additional functional criteria and insurance authorization.    Follow Up Recommendations  Home health OT (pending progress)    Assistance Recommended at Discharge Frequent or constant Supervision/Assistance  Equipment Recommendations  [facility]    Recommendations for Other Services      Precautions / Restrictions Precautions Precautions: Fall Precaution Comments: vent via trach, picc, NG tube Restrictions Weight Bearing Restrictions: No       Mobility Bed Mobility Overal bed mobility: Needs Assistance Bed Mobility:  Supine to Sit;Sit to Supine     Supine to sit: Min assist;+2 for physical assistance Sit to supine: Mod assist;+2 for physical assistance   General bed mobility comments: cues for direction, truncal assist/support while pt getting UE's in position to assist    Transfers Overall transfer level: Needs assistance Equipment used: Rolling walker (2 wheels);None Transfers: Sit to/from Stand Sit to Stand: Min assist;+2 physical assistance           General transfer comment: cues for hand placement and stability assist     Balance Overall balance assessment: Needs assistance Sitting-balance support: No upper extremity supported;Feet supported Sitting balance-[name]: Poor Sitting balance - Comments: listing forward mostly due to lethargy and "nodding off"   Standing balance support: Bilateral upper extremity supported;During functional activity Standing balance-[name]: Poor Standing balance comment: reliant on AD or external support                           ADL either performed or assessed with clinical judgement   ADL Overall ADL's : Needs assistance/impaired Eating/Feeding: NPO   Grooming: Maximal assistance;Sitting Grooming Details (indicate cue type and reason): sitting EOB, suction oral care with max assist Upper Body Bathing: Maximal assistance;Sitting       Upper Body Dressing : Maximal assistance;Sitting   Lower Body Dressing: Total assistance;+2 for physical assistance;Sit to/from stand               Functional mobility during ADLs: +2 for physical assistance;+2 for safety/equipment;Minimal assistance       Vision       Perception     Praxis  Cognition Arousal/Alertness: Lethargic Behavior During Therapy: WFL for tasks assessed/performed;Flat affect Overall Cognitive Status: Difficult to assess                                 General Comments: pt on vent via trach, follows simple commands but requires  increased time.          Exercises Exercises: Other exercises Other Exercises Other Exercises: bil hip/knee flex/ext with graded resistance x10   Shoulder Instructions       General Comments VSS    Pertinent Vitals/ Pain       Pain Assessment: Faces Faces Pain Scale: Hurts little more Pain Location: chest Pain Descriptors / Indicators: Sore Pain Intervention(s): Monitored during session;Repositioned  Home Living                                          Prior Functioning/Environment              Frequency  Min 2X/week        Progress Toward Goals  OT Goals(current goals can now be found in the care plan section)  Progress towards OT goals: Not progressing toward goals - comment  Acute Rehab OT Goals OT Goal Formulation: Patient unable to participate in goal setting Time For Goal Achievement: 10/28/21 Potential to Achieve Goals: Good  Plan Discharge plan remains appropriate;Frequency remains appropriate    Co-evaluation    PT/OT/SLP Co-Evaluation/Treatment: Yes Reason for Co-Treatment: To address functional/ADL transfers PT goals addressed during session: Mobility/safety with mobility;Strengthening/ROM OT goals addressed during session: ADL's and self-care      AM-PAC OT "6 Clicks" Daily Activity     Outcome Measure   Help from another person eating meals?: Total Help from another person taking care of personal grooming?: A Lot Help from another person toileting, which includes using toliet, bedpan, or urinal?: Total Help from another person bathing (including washing, rinsing, drying)?: A Lot Help from another person to put on and taking off regular upper body clothing?: A Lot Help from another person to put on and taking off regular lower body clothing?: Total 6 Click Score: 9    End of Session Equipment Utilized During Treatment: Oxygen (vent via trach)  OT Visit Diagnosis: Other abnormalities of gait and mobility  (R26.89);Other symptoms and signs involving cognitive function   Activity Tolerance Patient tolerated treatment well   Patient Left with call bell/phone within reach;with bed alarm set;in bed;with restraints reapplied   Nurse Communication Mobility status        Time: 6295-2841 OT Time Calculation (min): 24 min  Charges: OT General Charges $OT Visit: 1 Visit OT Treatments $Self Care/Home Management : 8-22 mins  [place] Pager [phone] Office [place] 10/14/2021, 1:30 PM

## 2021-10-14 NOTE — Progress Notes (Signed)
Nutrition Follow-up  DOCUMENTATION CODES:   Obesity unspecified  INTERVENTION:   - Plan Cortrak placement tomorrow  Initiate tube feeds via NG tube: - Vital 1.5 @ 55 ml/hr (1320 ml/day) - ProSource TF 45 ml TID  Tube feeding regimen provides 2100 kcal, 122 grams of protein, and 1008 ml of H2O.   NUTRITION DIAGNOSIS:   Inadequate oral intake related to inability to eat as evidenced by NPO status.  Ongoing, being addressed via TF  GOAL:   Patient will meet greater than or equal to 90% of their needs  Met via TF  MONITOR:   Vent status, Labs, Weight trends, TF tolerance, I & O's  REASON FOR ASSESSMENT:   Ventilator, Consult Enteral/tube feeding initiation and management  ASSESSMENT:   Pt with PMH of DM, HTN, and smoking admitted with acute encephalopathy.  10/10 - admit, intubated 10/12 - extubated and re-intubated 10/14 - extubated  10/15 - dysphagia 3 diet s/p swallow eval 10/22 - returned to BiPAP 10/25 - re-intubated, trach 10/26 - Cortrak placed (gastric tip confirmed via x-ray) 11/02 - Code Blue due to malpositioning of trach, s/p re-do trach 11/03 - NG tube placed (tip in stomach)  Discussed pt with RN and during ICU rounds. Consult received for tube feeding initiation and management. Pt with NG tube in place, x-ray confirmation that tube is in stomach. Plan is for Cortrak tomorrow.  Pt with excellent PO intake prior to requiring vent support with meal completions 75-100%.  Admit weight: 100 kg Current weight: 101 kg  Patient is currently requiring ventilator support via trach MV: 11.4 L/min Temp (24hrs), Avg:99.4 F (37.4 C), Min:97.9 F (36.6 C), Max:102.1 F (38.9 C)  Drips: Precedex  Medications reviewed and include: colace, SSI q 4 hours, IV protonix, miralax, prednisone, IV abx  Labs reviewed: BUN 54, creatinine 2.75, TG 212, hemoglobin 7.2 CBG's: 58-159 x 24 hours  UOP: 350 ml x 24 hours I/O's: -1.5 L since admit  Diet Order:    Diet Order             Diet NPO time specified  Diet effective now                   EDUCATION NEEDS:   Not appropriate for education at this time  Skin:  Skin Assessment: Reviewed RN Assessment  Last BM:  10/12/21  Height:   Ht Readings from Last 1 Encounters:  10/05/21 _0  (1.676 m)    Weight:   Wt Readings from Last 1 Encounters:  10/14/21 101 kg    Ideal Body Weight:  59.1 kg  BMI:  Body mass index is 35.94 kg/m.  Estimated Nutritional Needs:   Kcal:  1900-2100  Protein:  >/= 115 gm  Fluid:  >2L    Gustavus Bryant, MS, RD, LDN Inpatient Clinical Dietitian Please see AMiON for contact information.

## 2021-10-14 NOTE — Progress Notes (Addendum)
NAMEEvadna Jones, MRN:  [id number], DOB:  1966-04-16, LOS: 24 ADMISSION DATE:  09/20/2021, CONSULTATION DATE: 10/10 REFERRING MD:  Dr. Laverta Baltimore, CHIEF COMPLAINT:  AMS   History of Present Illness:  55 y/o F who presented to [facility] on 10/10 with reports of acute altered mental status secondary to hypertensive emergency with hypoxic respiratory failure related to VCD/post intubation subglottic and glottic edema/vocal hoarseness  +/- asthmatic exacerbation.  Ultimately required trach placement.   Transferred back to ICU on 11/2 after trach became dislodged on floor, resulting in PEA arrest with 10 min CPR and 2mg  epi. ROSC obtained. Trach revised to 6.0 XLT.  She remains critically ill.  Pertinent  Medical History  HTN DM   Significant Hospital Events: Including procedures, antibiotic start and stop dates in addition to other pertinent events   10/10 Admit with acute encephalopathy, hypertensive 10/12 extubated, increased WOB, re-intubated 10/14 extubated , lingering stridor, Received racemic epi x2 over the last 24 hours + another dose of Decadron, Mild agitation requiring restart of Precedex 10/15 swallow evaluation >> dysphagia 3 diet 10/20 decompensated after switch to PO steroids 10/21 better after CAT and back on IV steroids 10/22 agitated back on  BIPAP 10/25 stridor again. Marked work of breathing. Intubated, airway noises, stridor and wheezing completely resolved post intubation. Spoke to ENT who will follow after trach. Trach placed by Dr Vaughan Browner. Seroquel increased to 100 qhs, changes solumedrol to prednisone to begin taper, rested on propofol over night. Tubefeeds started. RUE PICC placed  10/26 prop weaning off attempting ATC  10/13/2021 PEA arrest in setting of dislodged tracheostomy. questionable syncope.  Total 10 minutes CPR. Transferred to intensive care unit intubated via trach stoma for redo trach 11/2/202.  Redo tracheostomy. 6 XLT.  Interim History / Subjective:  PEA  arrest after trach dislodgement. Found in false lumen. Trach revised to 6.0 XLT  Fent 200, prop 18  Trach/sedated  Febrile to 102.1, started on zosyn overnight. WBC 16>19>13.1  -8L admit, 350 uop  Unable to obtain subjective evaluation due to patient status  Objective   Blood pressure 113/62, pulse 70, temperature 99.1 F (37.3 C), temperature source Oral, resp. rate (!) 24, height 5\' 6"  (1.676 m), weight 101 kg, SpO2 93 %.    Vent Mode: PRVC FiO2 (%):  [35 %-100 %] 60 % Set Rate:  [24 bmp] 24 bmp Vt Set:  [470 mL] 470 mL PEEP:  [8 cmH20-10 cmH20] 8 cmH20 Plateau Pressure:  [18 cmH20-27 cmH20] 22 cmH20   Intake/Output Summary (Last 24 hours) at 10/14/2021 0724 Last data filed at 10/14/2021 0230 Gross per 24 hour  Intake 162.12 ml  Output 350 ml  Net -187.88 ml   Filed Weights   10/09/21 0500 10/11/21 0500 10/14/21 0500  Weight: 102.5 kg 104.6 kg 101 kg    Examination: General: In bed, NAD, appears comfortable HEENT: MM pink/moist, anicteric, atraumatic, new 6.0 xlt in place Neuro: RASS -1, PERRL 60mm, GCS 11t, MAE CV: S1S2, NSR, no m/r/g appreciated PULM:  air movement in all lobes, diminished in bases, trachea midline, chest expansion symmetric GI: rounded, bsx4 active, non-tender   Extremities: warm/dry, no pretibial edema, capillary refill less than 3 seconds  Skin:no rashes or lesions noted   Resolved Hospital Problem list   RML/RLL PNA (treated) Hypertensive emergency  Assessment & Plan:   Acute respiratory failure with hypoxia 2/2 mixed picture of VCD/post intubation subglottic and glottic edema/vocal hoarseness  +/- asthmatic exacerbation Tracheostomy dislodgement 11/2 Trach found to be  in false lumen with bronchosopy. Patient "passed out" on the floor and PEA arrested. Unclear how trach was dislodged. Trach revised to 6.0XLT.  P -LTVV strategy with tidal volumes of 4-8 cc/kg ideal body weight -Goal plateau pressures less than 30 and driving pressures less  than 15 -Wean PEEP/FiO2 for SpO2 92-98% -VAP bundle -Daily SAT and SBT, TCT as tolerated -Stop sedation this AM -RASS goal 0 to -1 -Follow intermittent CXR and ABG PRN -Continue bronchodilators -Follow up sputum culture -Pulmonary toilet as able -Re-engage SLP in setting of trach revision  Status post in hospital PEA arrest Troponin elevation 10 minutes CPR, 2 epi, rosc obtained. Believed to be purely respiratory driven. Troponin 27>55. Suspect secondary to chest trauma from CPR P -Continue telemetry -12lead PRN  Chronic kidney disease stage IV Baseline 2-2.3. Now 2.41>2.75 P -Ensure renal perfusion. Goal MAP 65 or greater. -Avoid neprotoxic drugs as possible. -Strict I&O's -Follow up AM creatinine  Right middle lobe right lower lobe pneumonia Anitbiotics completed previously. Zosyn started by night team on 11/3. Febrile 102.1 WBC 16>19>13.1. CXR concerning for RL pnu. -Check MRSA PCR -Switch Zosyn to Cefepime -Check TA  Diabetes mellitus Hypoglycemia BG 58, 63 overnight -Blood Glucose goal 140-180. -Hold SSI at this time -q4h BG  Normocytic Anemia HGB 7.8>7.2. No signs of active bleeding -Transfuse PRBC if HBG less than 7 -Obtain AM CBC to trend H&H -Monitor for signs of bleeding  HX HTN Stopped previous agents s/p cardiac arrest. Hypertensive urgency on admission.  -Monitor BP, resume previous agents as clinical course progresses  Anxiety Anxiety episode pm 11/1 -continue prozac, seroquel, klonopin.   Best Practice (right click and "Reselect all SmartList Selections" daily)   Diet/type: tubefeeds DVT prophylaxis: prophylactic heparin  GI prophylaxis: PPI Lines: PICC Foley:  N/A Code Status:  full code Last date of multidisciplinary goals of care discussion [pending]   Critical care time: 35 minutes    Redmond School., MSN, APRN, AGACNP-BC [facility]  10/14/2021 , 7:50 AM  Please see [email or web address] for pager  details  If no response, please call [phone] After hours, please call [place] at [phone]

## 2021-10-14 NOTE — Progress Notes (Signed)
[facility] Progress Note Patient Name: Maureen Jones DOB: Dec 26, 1965 MRN: [id number]   Date of Service  10/14/2021  HPI/Events of Note  Chest pain - 10/10. EKG reveals normal sinus rhythm and right bundle branch block. BP = 133/66 with HR = 88.  eICU Interventions  Plan: Cycle Troponin. Nitroglycerin 0.4 mg SL Q 5 minutes PRN chest pain.      Intervention Category Major Interventions: Other:  Lysle Dingwall 10/14/2021, 8:48 PM

## 2021-10-15 ENCOUNTER — Inpatient Hospital Stay (HOSPITAL_COMMUNITY): Payer: Medicare Other

## 2021-10-15 DIAGNOSIS — G9340 Encephalopathy, unspecified: Secondary | ICD-10-CM | POA: Diagnosis not present

## 2021-10-15 DIAGNOSIS — J383 Other diseases of vocal cords: Secondary | ICD-10-CM | POA: Diagnosis not present

## 2021-10-15 DIAGNOSIS — J9601 Acute respiratory failure with hypoxia: Secondary | ICD-10-CM | POA: Diagnosis not present

## 2021-10-15 DIAGNOSIS — J384 Edema of larynx: Secondary | ICD-10-CM | POA: Diagnosis not present

## 2021-10-15 LAB — CULTURE, RESPIRATORY W GRAM STAIN: Culture: NORMAL

## 2021-10-15 LAB — CBC
HCT: 24.8 % — ABNORMAL LOW (ref 36.0–46.0)
Hemoglobin: 7.4 g/dL — ABNORMAL LOW (ref 12.0–15.0)
MCH: 28.1 pg (ref 26.0–34.0)
MCHC: 29.8 g/dL — ABNORMAL LOW (ref 30.0–36.0)
MCV: 94.3 fL (ref 80.0–100.0)
Platelets: 198 10*3/uL (ref 150–400)
RBC: 2.63 MIL/uL — ABNORMAL LOW (ref 3.87–5.11)
RDW: 14.2 % (ref 11.5–15.5)
WBC: 12.7 10*3/uL — ABNORMAL HIGH (ref 4.0–10.5)
nRBC: 0 % (ref 0.0–0.2)

## 2021-10-15 LAB — BASIC METABOLIC PANEL
Anion gap: 10 (ref 5–15)
BUN: 55 mg/dL — ABNORMAL HIGH (ref 6–20)
CO2: 24 mmol/L (ref 22–32)
Calcium: 9.3 mg/dL (ref 8.9–10.3)
Chloride: 103 mmol/L (ref 98–111)
Creatinine, Ser: 2.59 mg/dL — ABNORMAL HIGH (ref 0.44–1.00)
GFR, Estimated: 21 mL/min — ABNORMAL LOW (ref >60)
Glucose, Bld: 191 mg/dL — ABNORMAL HIGH (ref 70–99)
Potassium: 4.6 mmol/L (ref 3.5–5.1)
Sodium: 137 mmol/L (ref 135–145)

## 2021-10-15 LAB — GLUCOSE, CAPILLARY
Glucose-Capillary: 132 mg/dL — ABNORMAL HIGH (ref 70–99)
Glucose-Capillary: 138 mg/dL — ABNORMAL HIGH (ref 70–99)
Glucose-Capillary: 184 mg/dL — ABNORMAL HIGH (ref 70–99)
Glucose-Capillary: 186 mg/dL — ABNORMAL HIGH (ref 70–99)
Glucose-Capillary: 191 mg/dL — ABNORMAL HIGH (ref 70–99)
Glucose-Capillary: 227 mg/dL — ABNORMAL HIGH (ref 70–99)
Glucose-Capillary: 241 mg/dL — ABNORMAL HIGH (ref 70–99)

## 2021-10-15 LAB — PHOSPHORUS
Phosphorus: 4.2 mg/dL (ref 2.5–4.6)
Phosphorus: 3.9 mg/dL (ref 2.5–4.6)

## 2021-10-15 LAB — MAGNESIUM
Magnesium: 2.1 mg/dL (ref 1.7–2.4)
Magnesium: 2.1 mg/dL (ref 1.7–2.4)

## 2021-10-15 LAB — TROPONIN I (HIGH SENSITIVITY): Troponin I (High Sensitivity): 191 ng/L (ref <18)

## 2021-10-15 MED ORDER — BETHANECHOL CHLORIDE 10 MG PO TABS
10.0000 mg | ORAL_TABLET | Freq: Three times a day (TID) | ORAL | Status: AC
  Administered 2021-10-15 – 2021-10-17 (×9): 10 mg
  Filled 2021-10-15 (×11): qty 1

## 2021-10-15 MED ORDER — CARVEDILOL 6.25 MG PO TABS
6.2500 mg | ORAL_TABLET | Freq: Two times a day (BID) | ORAL | Status: DC
Start: 2021-10-15 — End: 2021-10-20
  Administered 2021-10-15 – 2021-10-19 (×10): 6.25 mg
  Filled 2021-10-15 (×11): qty 1

## 2021-10-15 MED ORDER — INSULIN DETEMIR 100 UNIT/ML ~~LOC~~ SOLN
5.0000 [IU] | Freq: Once | SUBCUTANEOUS | Status: AC
  Administered 2021-10-15: 5 [IU] via SUBCUTANEOUS
  Filled 2021-10-15: qty 0.05

## 2021-10-15 MED ORDER — HEPARIN SODIUM (PORCINE) 5000 UNIT/ML IJ SOLN
5000.0000 [IU] | Freq: Three times a day (TID) | INTRAMUSCULAR | Status: DC
  Administered 2021-10-15 – 2021-11-15 (×93): 5000 [IU] via SUBCUTANEOUS
  Filled 2021-10-15 (×93): qty 1

## 2021-10-15 MED ORDER — HEPARIN (PORCINE) 25000 UT/250ML-% IV SOLN
900.0000 [IU]/h | INTRAVENOUS | Status: DC
  Administered 2021-10-15: 900 [IU]/h via INTRAVENOUS
  Filled 2021-10-15: qty 250

## 2021-10-15 MED ORDER — GUAIFENESIN 100 MG/5ML PO LIQD: 5.0000 mL | ORAL | Status: DC | PRN

## 2021-10-15 MED ORDER — ISOSORBIDE DINITRATE 10 MG PO TABS
10.0000 mg | ORAL_TABLET | Freq: Two times a day (BID) | ORAL | Status: DC
  Administered 2021-10-15 – 2021-10-22 (×15): 10 mg
  Filled 2021-10-15 (×17): qty 1

## 2021-10-15 MED ORDER — METOPROLOL TARTRATE 5 MG/5ML IV SOLN
2.5000 mg | Freq: Four times a day (QID) | INTRAVENOUS | Status: DC
  Administered 2021-10-15: 2.5 mg via INTRAVENOUS
  Filled 2021-10-15: qty 5

## 2021-10-15 MED ORDER — INSULIN DETEMIR 100 UNIT/ML ~~LOC~~ SOLN
20.0000 [IU] | Freq: Two times a day (BID) | SUBCUTANEOUS | Status: DC
Start: 2021-10-15 — End: 2021-10-17
  Administered 2021-10-15 – 2021-10-16 (×3): 20 [IU] via SUBCUTANEOUS
  Filled 2021-10-15 (×5): qty 0.2

## 2021-10-15 NOTE — Progress Notes (Addendum)
NAMEShanetta Jones, MRN:  [id number], DOB:  May 10, 1966, LOS: 26 ADMISSION DATE:  09/20/2021, CONSULTATION DATE: 10/10 REFERRING MD:  Dr. Laverta Baltimore, CHIEF COMPLAINT:  AMS   History of Present Illness:  55 y/o F who presented to [facility] on 10/10 with reports of acute altered mental status secondary to hypertensive emergency with hypoxic respiratory failure related to VCD/post intubation subglottic and glottic edema/vocal hoarseness  +/- asthmatic exacerbation.  Ultimately required trach placement.   Transferred back to ICU on 11/2 after trach became dislodged on floor, resulting in PEA arrest with 10 min CPR and 2mg  epi. ROSC obtained. Trach revised to 6.0 XLT.  She remains critically ill.  Pertinent  Medical History  HTN DM   Significant Hospital Events: Including procedures, antibiotic start and stop dates in addition to other pertinent events   10/10 Admit with acute encephalopathy, hypertensive 10/12 extubated, increased WOB, re-intubated 10/14 extubated , lingering stridor, Received racemic epi x2 over the last 24 hours + another dose of Decadron, Mild agitation requiring restart of Precedex 10/15 swallow evaluation >> dysphagia 3 diet 10/20 decompensated after switch to PO steroids 10/21 better after CAT and back on IV steroids 10/22 agitated back on  BIPAP 10/25 stridor again. Marked work of breathing. Intubated, airway noises, stridor and wheezing completely resolved post intubation. Spoke to ENT who will follow after trach. Trach placed by Dr Vaughan Browner. Seroquel increased to 100 qhs, changes solumedrol to prednisone to begin taper, rested on propofol over night. Tubefeeds started. RUE PICC placed  10/26 prop weaning off attempting ATC  10/13/2021 PEA arrest in setting of dislodged tracheostomy. questionable syncope.  Total 10 minutes CPR. Transferred to intensive care unit intubated via trach stoma for redo trach 11/2/202.  Redo tracheostomy. 6 XLT. 11/4- concern overnight for STEMI, hep  gtt started, cards consulted  Interim History / Subjective:  concern overnight for STEMI, hep gtt started, cards consulted  Tmax 99.1  -5.2 L admit, -350ml past 24, 1.4 L UOP  TA pending  0.4 precedex, hep gtt  On vent, sedation  Unable to obtain subjective evaluation due to patient status  Objective   Blood pressure (!) 130/109, pulse 75, temperature 98.9 F (37.2 C), temperature source Oral, resp. rate 20, height 5\' 6"  (1.676 m), weight 101.8 kg, SpO2 96 %.    Vent Mode: PRVC FiO2 (%):  [40 %-60 %] 40 % Set Rate:  [20 bmp-24 bmp] 20 bmp Vt Set:  [470 mL] 470 mL PEEP:  [8 cmH20] 8 cmH20 Plateau Pressure:  [21 cmH20-23 cmH20] 22 cmH20   Intake/Output Summary (Last 24 hours) at 10/15/2021 0716 Last data filed at 10/15/2021 0325 Gross per 24 hour  Intake 1170.94 ml  Output 1470 ml  Net -299.06 ml   Filed Weights   10/11/21 0500 10/14/21 0500 10/15/21 0350  Weight: 104.6 kg 101 kg 101.8 kg    Examination: General: In bed, NAD, appears comfortable HEENT: MM pink/moist, anicteric, atraumatic Neuro: RASS 0, PERRL 21mm, GCS 11t CV: S1S2, NSR, no m/r/g appreciated PULM: air movement in all lobes, trachea midline, chest expansion symmetric GI: soft, bsx4 active, non-tender   Extremities: warm/dry, no pretibial edema, capillary refill less than 3 seconds  Skin:  no rashes or lesions noted   Resolved Hospital Problem list   RML/RLL PNA (treated) Hypertensive emergency  Assessment & Plan:   Acute respiratory failure with hypoxia 2/2 mixed picture of VCD/post intubation subglottic and glottic edema/vocal hoarseness  +/- asthmatic exacerbation Tracheostomy dislodgement 11/2 Trach found to be  in false lumen with bronchosopy. Patient "passed out" on the floor and PEA arrested. Unclear how trach was dislodged. Trach revised to 6.0XLT.  P -LTVV strategy with tidal volumes of 4-8 cc/kg ideal body weight -Goal plateau pressures less than 30 and driving pressures less than  15 -Wean PEEP/FiO2 for SpO2 92-98% -VAP bundle -Daily SAT and SBT, hope for TCT today -PAD bundle with Precedex gtt, wean precedex off today -RASS goal 0 to -1 -Follow intermittent CXR and ABG PRN -Continue Bds -Follow up TA -Pulm toilet as able -SLP evaluation s/p trach change  Status post in hospital PEA arrest Troponin elevation RBBB 10 minutes CPR, 2 epi, rosc obtained. Believed to be purely respiratory driven. Troponin 27>55>203>191. Suspect secondary to chest trauma from CPR. Overnight nursing concerned about patient reports of chest pain and STE on bedside tele. Initial EKG 11/4 with STE in leads v3 and v4, 2 hours later no longer present. Heparin GTT started overnight and cards consulted. Chest pain suspected secondary to compressions. P -PRN EKG troponin -Follow up cards consult and recs -Continue heparin gtt at this time  Addendum Cards note reviewed: Stopping heparin gtt, plan to resume coreg today 6.25 BID  Chronic kidney disease stage IV Baseline 2-2.3. Now 2.41>2.75>2.59. 1.4 L UOP. P -Ensure renal perfusion. Goal MAP 65 or greater. -Avoid neprotoxic drugs as possible. -Strict I&O's -Follow up AM creatinine  HAP- Right middle lobe, right lower lobe  Anitbiotics completed previously. Zosyn started by night team on 11/3.  WBC 16>19>13.1>12.7. Afebrile past 24. CXR concerning for RL pnu. MRSA PCR neg.  -Continue cefepime, day 2 -Follow up TA.  Diabetes mellitus Hypoglycemia-Resolved BG 132-191. Tube feeding resumed -Blood Glucose goal 140-180. -SSI -levemir 15 bid, will monitor for adjustment to 20  Normocytic Anemia HGB 7.8>7.2>7.4. No signs of active bleeding -Transfuse PRBC if HBG less than 7 -Obtain AM CBC to trend H&H -Monitor for signs of bleeding  HX HTN Stopped previous agents s/p cardiac arrest. Hypertensive urgency on admission. metoprolol 2.5 q6h IV started overnight -Once off precedex, will evaluate for restarting anti-htn cautiously,    Anxiety Anxiety episode pm 11/1 -continue prozac, seroquel, klonopin.   Best Practice (right click and "Reselect all SmartList Selections" daily)   Diet/type: tubefeeds DVT prophylaxis: systemic heparin GI prophylaxis: PPI Lines: PICC Foley:  N/A Code Status:  full code Last date of multidisciplinary goals of care discussion [pending]   Critical care time: 32 minutes    Redmond School., MSN, APRN, AGACNP-BC [facility]  10/15/2021 , 7:16 AM  Please see [email or web address] for pager details  If no response, please call [phone] After hours, please call Elink at [phone]

## 2021-10-15 NOTE — Progress Notes (Signed)
[place] Progress Note Patient Name: Maureen Jones DOB: 08/23/1966 MRN: [id number]   Date of Service  10/15/2021  HPI/Events of Note  Urinary retention - Bladder scan with > 469 residual.   eICU Interventions  Plan: I/O Cath PRN.      Intervention Category Major Interventions: Other:  Keyontay Stolz Cornelia Copa 10/15/2021, 3:05 AM

## 2021-10-15 NOTE — Procedures (Signed)
Cortrak  Person Inserting Tube:  Tevita Gomer E, RD Tube Type:  Cortrak - 43 inches Tube Size:  10 Tube Location:  Left nare Initial Placement:  Stomach Secured by: Bridle Technique Used to Measure Tube Placement:  Marking at nare/corner of mouth Cortrak Secured At:  65 cm   Cortrak Tube Team Note:  Consult received to place a Cortrak feeding tube.   X-ray is required, abdominal x-ray has been ordered by the Cortrak team. Please confirm tube placement before using the Cortrak tube.   If the tube becomes dislodged please keep the tube and contact the Cortrak team at [email or web address] (password [email or web address]) for replacement.  If after hours and replacement cannot be delayed, place a NG tube and confirm placement with an abdominal x-ray.    Maureen Willard, MS, RD, LDN (she/her/hers) RD pager number and weekend/on-call pager number located in Amion.    

## 2021-10-15 NOTE — Progress Notes (Signed)
SLP Cancellation Note  Patient Details Name: Maureen Jones MRN: [id number] DOB: Sep 09, 1966   Cancelled treatment:       Reason Eval/Treat Not Completed: Patient not medically ready (Pt remains on the vent. SLP will follow up next week.)  Felissa Blouch I. Hardin Negus, [place], [place] Office number [phone] Pager [place] 10/15/2021, 2:27 PM

## 2021-10-15 NOTE — Progress Notes (Signed)
ANTICOAGULATION CONSULT NOTE - Initial Consult  Pharmacy Consult for Heparin  Indication: chest pain/ACS  No Known Allergies  Patient Measurements: Height: 5\' 6"  (167.6 cm) Weight: 101.8 kg (224 lb 6.9 oz) IBW/kg (Calculated) : 59.3  Vital Signs: Temp: 98.9 F (37.2 C) (11/04 0410) Temp Source: Oral (11/04 0410) BP: 130/109 (11/04 0335) Pulse Rate: 75 (11/04 0335)  Labs: Recent Labs    10/13/21 1118 10/13/21 1418 10/13/21 1429 10/14/21 0445 10/14/21 2134 10/14/21 2338 10/15/21 0339  HGB 8.3*  --  7.8* 7.2*  --   --  7.4*  HCT 27.5*  --  23.0* 23.3*  --   --  24.8*  PLT 188  --   --  161  --   --  198  CREATININE 2.41*  --   --  2.75*  --   --  2.59*  TROPONINIHS 27* 55*  --   --  203* 191*  --     Estimated Creatinine Clearance: 29.9 mL/min (A) (by C-G formula based on SCr of 2.59 mg/dL (H)).   Medical History: Past Medical History:  Diagnosis Date   Asthma    Diabetes ([place])    Glottic and subglottic edema 2022   Hypertension      Assessment: 55 y/o F with multiple medical issues including recent PEA arrest secondary to dislodgement of trach. Hgb is low at 7.4 but has been stable at that for a few days. Noted renal dysfunction. Mildly elevated troponin. Some ST elevation on EKG.   Goal of Therapy:  Heparin level 0.3-0.7 units/ml Monitor platelets by anticoagulation protocol: Yes   Plan:  DC subcutaneous heparin Start heparin drip at 900 units/hr 1400 heparin level Daily CBC and heparin level F/U cardiology consult  Narda Bonds, PharmD, [place] Clinical Pharmacist Phone: [phone]

## 2021-10-15 NOTE — Progress Notes (Signed)
I have been in touch with Dr. Susa Day, patient was complaining of chest pain, troponins ordered, fairly flat troponins and EKG read as STEMI inferolateral wall.  I reviewed the EKG, has baseline artifact and no reciprocal changes.  Hence advised repeat EKG, which confirms my suspicion that she has normal sinus rhythm with underlying right bundle branch block without evidence of ischemia.  Consultation not done and discussed with Dr. Susa Day who is in agreement.  Discontinue heparin, continue beta-blocker therapy and present conservative therapy as deemed appropriate by primary team.  I am happy to see her back if assistance is needed in her management, from discussions with Dr. Oletta Darter, he feels that she is conservative management only.   Adrian Prows, MD, [facility] 10/15/2021, 8:09 AM Office: [phone] Fax: [phone] Pager: [phone]

## 2021-10-15 NOTE — Progress Notes (Addendum)
[place] Progress Note Patient Name: Maureen Jones DOB: [birth year] MRN: [id number]   Date of Service  10/15/2021  HPI/Events of Note  Nursing noted ST elevation on bedside monitor. EKG obtained which revealed normal sinus rhythm, right bundle branch block and ST elevation consider lateral injury or acute infarct. Already on ASA. Creatinine = 2.59. Not a great candidate to get a IV dye load in cath lab.   eICU Interventions  Plan: Heparin IV infusion per pharmacy consult. D/C Heparin [place]. Metoprolol 2.5 mg IV now and Q 6 hours. Hold for SBP < 105 or HR < 60. Will ask Cardiology to see the patient.     Intervention Category Major Interventions: Other:  Lysle Dingwall 10/15/2021, 4:53 AM

## 2021-10-15 NOTE — Progress Notes (Signed)
PT Cancellation Note  Patient Details Name: Maureen Jones MRN: [id number] DOB: 1966-10-05   Cancelled Treatment:    Reason Eval/Treat Not Completed: Patient not medically ready.  RN asking to hold, pt very lethargic and having trouble breathing.  She will chat if pt becomes appropriate. 10/15/2021  Ginger Carne., PT Acute Rehabilitation Services [phone]  (pager) [phone]  (office)   Tessie Fass Malayah Demuro 10/15/2021, 12:20 PM

## 2021-10-15 NOTE — Progress Notes (Signed)
[place] Progress Note Patient Name: Maureen Jones DOB: [birth year] MRN: [id number]   Date of Service  10/15/2021  HPI/Events of Note  Troponin = 203 --> 191.  eICU Interventions  Continue present management.      Intervention Category Major Interventions: Other:  Maureen Jones 10/15/2021, 3:08 AM

## 2021-10-16 ENCOUNTER — Inpatient Hospital Stay (HOSPITAL_COMMUNITY): Payer: Medicare Other

## 2021-10-16 DIAGNOSIS — G9340 Encephalopathy, unspecified: Secondary | ICD-10-CM | POA: Diagnosis not present

## 2021-10-16 DIAGNOSIS — R0603 Acute respiratory distress: Secondary | ICD-10-CM

## 2021-10-16 DIAGNOSIS — J9601 Acute respiratory failure with hypoxia: Secondary | ICD-10-CM | POA: Diagnosis not present

## 2021-10-16 LAB — BASIC METABOLIC PANEL
Anion gap: 8 (ref 5–15)
BUN: 50 mg/dL — ABNORMAL HIGH (ref 6–20)
CO2: 26 mmol/L (ref 22–32)
Calcium: 9.3 mg/dL (ref 8.9–10.3)
Chloride: 106 mmol/L (ref 98–111)
Creatinine, Ser: 2.44 mg/dL — ABNORMAL HIGH (ref 0.44–1.00)
GFR, Estimated: 23 mL/min — ABNORMAL LOW (ref >60)
Glucose, Bld: 162 mg/dL — ABNORMAL HIGH (ref 70–99)
Potassium: 4.4 mmol/L (ref 3.5–5.1)
Sodium: 140 mmol/L (ref 135–145)

## 2021-10-16 LAB — GLUCOSE, CAPILLARY
Glucose-Capillary: 157 mg/dL — ABNORMAL HIGH (ref 70–99)
Glucose-Capillary: 173 mg/dL — ABNORMAL HIGH (ref 70–99)
Glucose-Capillary: 173 mg/dL — ABNORMAL HIGH (ref 70–99)
Glucose-Capillary: 187 mg/dL — ABNORMAL HIGH (ref 70–99)
Glucose-Capillary: 213 mg/dL — ABNORMAL HIGH (ref 70–99)
Glucose-Capillary: 254 mg/dL — ABNORMAL HIGH (ref 70–99)

## 2021-10-16 LAB — CULTURE, RESPIRATORY W GRAM STAIN: Culture: NORMAL

## 2021-10-16 LAB — CBC
HCT: 24.6 % — ABNORMAL LOW (ref 36.0–46.0)
Hemoglobin: 7.2 g/dL — ABNORMAL LOW (ref 12.0–15.0)
MCH: 28.1 pg (ref 26.0–34.0)
MCHC: 29.3 g/dL — ABNORMAL LOW (ref 30.0–36.0)
MCV: 96.1 fL (ref 80.0–100.0)
Platelets: 239 10*3/uL (ref 150–400)
RBC: 2.56 MIL/uL — ABNORMAL LOW (ref 3.87–5.11)
RDW: 14.3 % (ref 11.5–15.5)
WBC: 11.5 10*3/uL — ABNORMAL HIGH (ref 4.0–10.5)
nRBC: 0 % (ref 0.0–0.2)

## 2021-10-16 LAB — PHOSPHORUS: Phosphorus: 3.4 mg/dL (ref 2.5–4.6)

## 2021-10-16 LAB — MAGNESIUM: Magnesium: 2.1 mg/dL (ref 1.7–2.4)

## 2021-10-16 MED ORDER — ALBUTEROL SULFATE (2.5 MG/3ML) 0.083% IN NEBU
INHALATION_SOLUTION | RESPIRATORY_TRACT | Status: AC
  Filled 2021-10-16: qty 3

## 2021-10-16 MED ORDER — CLONAZEPAM 1 MG PO TABS
1.0000 mg | ORAL_TABLET | Freq: Once | ORAL | Status: AC
  Administered 2021-10-16: 1 mg
  Filled 2021-10-16: qty 1

## 2021-10-16 MED ORDER — MIDAZOLAM HCL 2 MG/2ML IJ SOLN
INTRAMUSCULAR | Status: AC
  Administered 2021-10-16: 2 mg
  Filled 2021-10-16: qty 2

## 2021-10-16 MED ORDER — ETOMIDATE 2 MG/ML IV SOLN
INTRAVENOUS | Status: AC
  Administered 2021-10-16: 20 mg
  Filled 2021-10-16: qty 20

## 2021-10-16 MED ORDER — SUCCINYLCHOLINE CHLORIDE 200 MG/10ML IV SOSY
PREFILLED_SYRINGE | INTRAVENOUS | Status: AC
  Filled 2021-10-16: qty 10

## 2021-10-16 MED ORDER — QUETIAPINE FUMARATE 100 MG PO TABS
100.0000 mg | ORAL_TABLET | Freq: Two times a day (BID) | ORAL | Status: DC
Start: 2021-10-16 — End: 2021-10-20
  Administered 2021-10-16 – 2021-10-19 (×8): 100 mg
  Filled 2021-10-16 (×9): qty 1

## 2021-10-16 MED ORDER — ALBUTEROL SULFATE (2.5 MG/3ML) 0.083% IN NEBU
2.5000 mg | INHALATION_SOLUTION | Freq: Four times a day (QID) | RESPIRATORY_TRACT | Status: DC
Start: 2021-10-16 — End: 2021-10-17
  Administered 2021-10-16 (×2): 2.5 mg via RESPIRATORY_TRACT
  Filled 2021-10-16: qty 3

## 2021-10-16 MED ORDER — PANTOPRAZOLE 2 MG/ML SUSPENSION
40.0000 mg | Freq: Every day | ORAL | Status: DC
Start: 2021-10-17 — End: 2021-10-20
  Administered 2021-10-17 – 2021-10-19 (×3): 40 mg
  Filled 2021-10-16 (×3): qty 20

## 2021-10-16 MED ORDER — KETAMINE HCL 50 MG/5ML IJ SOSY
PREFILLED_SYRINGE | INTRAMUSCULAR | Status: AC
  Filled 2021-10-16: qty 5

## 2021-10-16 MED ORDER — FENTANYL CITRATE (PF) 100 MCG/2ML IJ SOLN
INTRAMUSCULAR | Status: AC
  Administered 2021-10-16: 100 ug via INTRAVENOUS
  Filled 2021-10-16: qty 2

## 2021-10-16 MED ORDER — ROCURONIUM BROMIDE 10 MG/ML (PF) SYRINGE
PREFILLED_SYRINGE | INTRAVENOUS | Status: AC
  Administered 2021-10-16: 40 mg
  Filled 2021-10-16: qty 10

## 2021-10-16 MED ORDER — CLONAZEPAM 1 MG PO TABS
2.0000 mg | ORAL_TABLET | Freq: Two times a day (BID) | ORAL | Status: DC
Start: 2021-10-16 — End: 2021-10-20
  Administered 2021-10-16 – 2021-10-19 (×7): 2 mg
  Filled 2021-10-16 (×8): qty 2

## 2021-10-16 NOTE — Progress Notes (Addendum)
10/16/2021  Seen for recurrent resp failure and cardiac arrest No events, waking up, following commands Placed back on vent last night Lungs with scattered rhonci, minimal edema, ext warm Labs reviewed and stable No new chest imaging F/u tracheal aspirate Still failing TC likely related to aspirated blood from cardiac arrest two days ago, will take time to heal   Patient critically ill due to resp failure Interventions to address this today vent titration Risk of deterioration without these interventions is high  I personally spent 33 minutes providing critical care not including any separately billable procedures  Erskine Emery MD [facility] [place] epic messenger for cross cover needs If after hours, please call E-link      NAME:  Maureen Jones, MRN:  [id number], DOB:  03-Feb-1966, LOS: 30 ADMISSION DATE:  09/20/2021, CONSULTATION DATE: 10/10 REFERRING MD:  Dr. Laverta Baltimore, CHIEF COMPLAINT:  AMS   History of Present Illness:  55 y/o F who presented to [facility] on 10/10 with reports of acute altered mental status secondary to hypertensive emergency with hypoxic respiratory failure related to VCD/post intubation subglottic and glottic edema/vocal hoarseness  +/- asthmatic exacerbation.  Ultimately required trach placement.   Transferred back to ICU on 11/2 after trach became dislodged on floor, resulting in PEA arrest with 10 min CPR and 2mg  epi. ROSC obtained. Trach revised to 6.0 XLT.  She remains critically ill.  Pertinent  Medical History  HTN DM   Significant Hospital Events: Including procedures, antibiotic start and stop dates in addition to other pertinent events   10/10 Admit with acute encephalopathy, hypertensive 10/12 extubated, increased WOB, re-intubated 10/14 extubated , lingering stridor, Received racemic epi x2 over the last 24 hours + another dose of Decadron, Mild agitation requiring restart of Precedex 10/15 swallow evaluation >> dysphagia 3  diet 10/20 decompensated after switch to PO steroids 10/21 better after CAT and back on IV steroids 10/22 agitated back on  BIPAP 10/25 stridor again. Marked work of breathing. Intubated, airway noises, stridor and wheezing completely resolved post intubation. Spoke to ENT who will follow after trach. Trach placed by Dr Vaughan Browner. Seroquel increased to 100 qhs, changes solumedrol to prednisone to begin taper, rested on propofol over night. Tubefeeds started. RUE PICC placed  10/26 prop weaning off attempting ATC  11/2 PEA arrest in setting of dislodged tracheostomy. questionable syncope.  Total 10 minutes CPR. Transferred to intensive care unit intubated via trach stoma for redo trach 11/2/202.  Redo tracheostomy. 6 XLT. 11/4 Concern overnight for STEMI, hep gtt started, cards consulted  Interim History / Subjective:  No acute events overnight, rested well on ventilator  Objective   Blood pressure (!) 142/66, pulse 76, temperature 99.7 F (37.6 C), temperature source Oral, resp. rate (!) 21, height 5\' 6"  (1.676 m), weight 102.4 kg, SpO2 96 %.    Vent Mode: PRVC FiO2 (%):  [40 %-60 %] 40 % Set Rate:  [20 bmp] 20 bmp Vt Set:  [140 mL-470 mL] 140 mL PEEP:  [5 cmH20-8 cmH20] 5 cmH20 Plateau Pressure:  [18 cmH20-22 cmH20] 22 cmH20   Intake/Output Summary (Last 24 hours) at 10/16/2021 0854 Last data filed at 10/16/2021 0600 Gross per 24 hour  Intake 1287.69 ml  Output 520 ml  Net 767.69 ml    Filed Weights   10/14/21 0500 10/15/21 0350 10/16/21 0500  Weight: 101 kg 101.8 kg 102.4 kg    Examination: General: Acute on chronic mildly deconditioned middle-aged female lying in bed on mechanical ventilation through  trach in no acute distress HEENT: 6 XLT Shiley midline, MM pink/moist, PERRL,  Neuro: Will arouse to verbal stimuli very easily slightly sleepy on low-dose Precedex drip CV: s1s2 regular rate and rhythm, no murmur, rubs, or gallops,  PULM: Clear to auscultation bilaterally, no  increased work of breathing, tolerating ventilator well GI: soft, bowel sounds active in all 4 quadrants, non-tender, non-distended, tolerating oral diet Extremities: warm/dry, no edema  Skin: no rashes or lesions  Resolved Hospital Problem list   RML/RLL PNA (treated) Hypertensive emergency  Assessment & Plan:   Acute respiratory failure with hypoxia 2/2 mixed picture of vocal cord dysfunction/post intubation subglottic and glottic edema/vocal hoarseness  +/- asthmatic exacerbation Tracheostomy dislodgement 11/2 -11/2 trach found to be in false lumen with bronchosopy. Patient "passed out" on the floor and PEA arrested. Unclear how trach was dislodged. Trach revised to 6.0XLT.  P Tolerated trach trial yesterday we will reattempt ATC trial this a.m. with goal to maintain Routine trach care Wean PEEP and FiO2 for sats greater than 90%. Head of bed elevated 30 degrees. Ensure adequate pulmonary hygiene  Continue bronchodilators  Status post in hospital PEA arrest Troponin elevation RBBB -10 minutes CPR, 2 epi, rosc obtained. Believed to be purely respiratory driven.  -Troponin 27>55>203>191. Suspect secondary to chest trauma from CPR. -11/4 overnight nursing concerned about patient reports of chest pain and ST elevation on bedside tele. Initial EKG 11/4 with STE in leads v3 and v4, 2 hours later no longer present. Heparin GTT started overnight and cards consulted. Chest pain suspected secondary to compressions. P: Cardiology originally consulted but on repeat EKG no concerning features of ischemia seen in consultation was canceled Heparin drip discontinued Continue beta-blocker therapy Continuous telemetry Monitor volume status  Chronic kidney disease stage IV -Baseline 2-2.3. Now 2.41>2.75>2.59. 1.4 L UOP. P: Follow renal function  Monitor urine output Trend Bmet Avoid nephrotoxins Ensure adequate renal perfusion   HCAP- Right middle lobe, right lower lobe  -Antibiotics  completed previously. Zosyn started by night team on 11/3.  WBC 16>19>13.1>12.7. Afebrile past 24. CXR concerning for RL pnu. MRSA PCR neg.  P: Remains on empiric cefepime Trend CBC and fever curve Follow respiratory culture  Diabetes mellitus Hypoglycemia-Resolved -BG 132-191. Tube feeding resumed P: Continue SSI CBG checks every 4 hours Continue long-acting Levemir CBG goal 1 40-1 80  Normocytic Anemia -HGB 7.8>7.2>7.4. No signs of active bleeding P: Transfuse per protocol Hemoglobin goal greater than 7 Monitor for signs of bleedine  HX HTN -Stopped previous agents s/p cardiac arrest. Hypertensive urgency on admission. -Home medication includes losartan/HCTZ P: Continue low-dose carvedilol continuous telemetry  Anxiety -Anxiety episode pm 11/1 P: Continue Prozac, increase Seroquel to 100 twice daily to facilitate weaning Precedex drip, and Klonopin  Best Practice (right click and "Reselect all SmartList Selections" daily)   Diet/type: tubefeeds DVT prophylaxis: systemic heparin GI prophylaxis: PPI Lines: PICC Foley:  N/A Code Status:  full code Last date of multidisciplinary goals of care discussion [pending]   Critical care time:   CRITICAL CARE Performed by: Whitney D. Harris  Total critical care time: 37 minutes  Critical care time was exclusive of separately billable procedures and treating other patients.  Critical care was necessary to treat or prevent imminent or life-threatening deterioration.  Critical care was time spent personally by me on the following activities: development of treatment plan with patient and/or surrogate as well as nursing, discussions with consultants, evaluation of patient's response to treatment, examination of patient, obtaining history from patient  or surrogate, ordering and performing treatments and interventions, ordering and review of laboratory studies, ordering and review of radiographic studies, pulse oximetry and  re-evaluation of patient's condition.  Whitney D. Kenton Kingfisher, NP-C [facility] Personal contact information can be found on Amion  10/16/2021, 9:05 AM

## 2021-10-16 NOTE — Progress Notes (Signed)
RT note- Trach changed to a 6 xlt proximal with Dr. Tamala Julian.

## 2021-10-16 NOTE — Procedures (Signed)
Bronchoscopy Procedure Note  Maureen Jones  [id number]  January 29, 1966  Date:10/16/21  Time:3:57 PM   Provider Performing:Valerie Cones C Venissa Nappi   Procedure(s):  Flexible Bronchoscopy [phone])  Indication(s) Difficulty ventilating  Consent Unable to obtain consent due to emergent nature of procedure.  Anesthesia Etomidate 86m   Time Out Verified patient identification, verified procedure, site/side was marked, verified correct patient position, special equipment/implants available, medications/allergies/relevant history reviewed, required imaging and test results available.   Sterile Technique Usual hand hygiene, masks, gowns, and gloves were used   Procedure Description Bronchoscope advanced through tracheostomy tube and into airway.    Findings:  - Distal XLT 6-0 shiley abutting posterior trachea with ~60% occlusion likely explanation for repeated weaning failures   Complications/Tolerance None; patient tolerated the procedure well. Chest X-ray is not needed post procedure.   EBL Minimal   Specimen(s) None

## 2021-10-16 NOTE — Progress Notes (Signed)
RT note- placed back to full support with ventilator due to mild distress and anxiety. Patient does seem to have some difficulty while suctioning. BBS clear diminished right greater than left.

## 2021-10-16 NOTE — Progress Notes (Signed)
RT attempted placing pt on trach collar 40%/10L. Pt tolerated at first, however started to point at trach like she was panicked and desaturated. Pt placed pt back on vent at this time. RT will monitor.

## 2021-10-16 NOTE — Procedures (Signed)
Tracheostomy Exchange Procedure Note  Maureen Jones  [id number]  1966-07-16  Date:10/16/21  Time:4:00 PM   Provider Performing:Shaden Lacher Cipriano Mile   Procedure: Tracheostomy Exchange Through Immature Stoma [phone])  Indication(s) Trach malposition  Consent Unable to obtain consent due to emergent nature of procedure.  Anesthesia Versed/fent/rocuronium/etomidate.   Time Out Verified patient identification, verified procedure, site/side was marked, verified correct patient position, special equipment/implants available, medications/allergies/relevant history reviewed, required imaging and test results available.   Sterile Technique Hand hygiene, gloves   Procedure Description Size 6-0 distal XLT cuffed existing Shiley removed and size 6-0 proximal cuffed Shiley placed through stoma.  Placement confirmed by bronchoscopy: looks in better position, should facilitate wean better.  Blood and secretions suctioned from airway, still pretty thick, culture pending.   Complications/Tolerance None; patient tolerated the procedure well..   EBL Minimal

## 2021-10-17 DIAGNOSIS — J9601 Acute respiratory failure with hypoxia: Secondary | ICD-10-CM | POA: Diagnosis not present

## 2021-10-17 LAB — BASIC METABOLIC PANEL
Anion gap: 8 (ref 5–15)
BUN: 47 mg/dL — ABNORMAL HIGH (ref 6–20)
CO2: 24 mmol/L (ref 22–32)
Calcium: 9.5 mg/dL (ref 8.9–10.3)
Chloride: 109 mmol/L (ref 98–111)
Creatinine, Ser: 2.15 mg/dL — ABNORMAL HIGH (ref 0.44–1.00)
GFR, Estimated: 27 mL/min — ABNORMAL LOW (ref >60)
Glucose, Bld: 298 mg/dL — ABNORMAL HIGH (ref 70–99)
Potassium: 4.7 mmol/L (ref 3.5–5.1)
Sodium: 141 mmol/L (ref 135–145)

## 2021-10-17 LAB — CBC
HCT: 24.9 % — ABNORMAL LOW (ref 36.0–46.0)
Hemoglobin: 7.2 g/dL — ABNORMAL LOW (ref 12.0–15.0)
MCH: 28.1 pg (ref 26.0–34.0)
MCHC: 28.9 g/dL — ABNORMAL LOW (ref 30.0–36.0)
MCV: 97.3 fL (ref 80.0–100.0)
Platelets: 251 K/uL (ref 150–400)
RBC: 2.56 MIL/uL — ABNORMAL LOW (ref 3.87–5.11)
RDW: 14.3 % (ref 11.5–15.5)
WBC: 9.8 K/uL (ref 4.0–10.5)
nRBC: 0 % (ref 0.0–0.2)

## 2021-10-17 LAB — GLUCOSE, CAPILLARY
Glucose-Capillary: 267 mg/dL — ABNORMAL HIGH (ref 70–99)
Glucose-Capillary: 282 mg/dL — ABNORMAL HIGH (ref 70–99)
Glucose-Capillary: 293 mg/dL — ABNORMAL HIGH (ref 70–99)
Glucose-Capillary: 170 mg/dL — ABNORMAL HIGH (ref 70–99)
Glucose-Capillary: 217 mg/dL — ABNORMAL HIGH (ref 70–99)
Glucose-Capillary: 203 mg/dL — ABNORMAL HIGH (ref 70–99)

## 2021-10-17 LAB — MAGNESIUM: Magnesium: 2.1 mg/dL (ref 1.7–2.4)

## 2021-10-17 MED ORDER — SODIUM CHLORIDE 0.9 % IV SOLN
2.0000 g | Freq: Two times a day (BID) | INTRAVENOUS | Status: AC
  Administered 2021-10-17 – 2021-10-18 (×3): 2 g via INTRAVENOUS
  Filled 2021-10-17 (×3): qty 2

## 2021-10-17 MED ORDER — SODIUM CHLORIDE 0.9 % IV SOLN: 2.0000 g | INTRAVENOUS | Status: DC

## 2021-10-17 MED ORDER — NYSTATIN 100000 UNIT/ML MT SUSP
5.0000 mL | Freq: Four times a day (QID) | OROMUCOSAL | Status: AC
  Administered 2021-10-17 – 2021-10-24 (×24): 500000 [IU] via ORAL
  Filled 2021-10-17 (×20): qty 5

## 2021-10-17 MED ORDER — ALBUTEROL SULFATE (2.5 MG/3ML) 0.083% IN NEBU
2.5000 mg | INHALATION_SOLUTION | RESPIRATORY_TRACT | Status: DC | PRN
  Administered 2021-10-18 – 2021-10-22 (×2): 2.5 mg via RESPIRATORY_TRACT
  Filled 2021-10-17 (×2): qty 3

## 2021-10-17 MED ORDER — INSULIN DETEMIR 100 UNIT/ML ~~LOC~~ SOLN
30.0000 [IU] | Freq: Two times a day (BID) | SUBCUTANEOUS | Status: DC
Start: 2021-10-17 — End: 2021-10-20
  Administered 2021-10-17 – 2021-10-20 (×8): 30 [IU] via SUBCUTANEOUS
  Filled 2021-10-17 (×9): qty 0.3

## 2021-10-17 MED ORDER — LIDOCAINE 5 % EX PTCH
2.0000 | MEDICATED_PATCH | CUTANEOUS | Status: DC
  Administered 2021-10-17: 2 via TRANSDERMAL
  Administered 2021-10-18: 1 via TRANSDERMAL
  Administered 2021-10-19 – 2021-11-14 (×10): 2 via TRANSDERMAL
  Filled 2021-10-17 (×22): qty 2

## 2021-10-17 NOTE — Progress Notes (Signed)
NAMERonnell Jones, MRN:  [id number], DOB:  1966-08-29, LOS: 66 ADMISSION DATE:  09/20/2021, CONSULTATION DATE: 10/10 REFERRING MD:  Dr. Laverta Baltimore, CHIEF COMPLAINT:  AMS   History of Present Illness:  55 y/o F who presented to [facility] on 10/10 with reports of acute altered mental status secondary to hypertensive emergency with hypoxic respiratory failure related to VCD/post intubation subglottic and glottic edema/vocal hoarseness  +/- asthmatic exacerbation.  Ultimately required trach placement.   Transferred back to ICU on 11/2 after trach became dislodged on floor, resulting in PEA arrest with 10 min CPR and 2mg  epi. ROSC obtained. Trach revised to 6.0 XLT.  She remains critically ill.  Pertinent  Medical History  HTN DM   Significant Hospital Events: Including procedures, antibiotic start and stop dates in addition to other pertinent events   10/10 Admit with acute encephalopathy, hypertensive 10/12 extubated, increased WOB, re-intubated 10/14 extubated , lingering stridor, Received racemic epi x2 over the last 24 hours + another dose of Decadron, Mild agitation requiring restart of Precedex 10/15 swallow evaluation >> dysphagia 3 diet 10/20 decompensated after switch to PO steroids 10/21 better after CAT and back on IV steroids 10/22 agitated back on  BIPAP 10/25 stridor again. Marked work of breathing. Intubated, airway noises, stridor and wheezing completely resolved post intubation. Spoke to ENT who will follow after trach. Trach placed by Dr Vaughan Browner. Seroquel increased to 100 qhs, changes solumedrol to prednisone to begin taper, rested on propofol over night. Tubefeeds started. RUE PICC placed  10/26 prop weaning off attempting ATC  11/2 PEA arrest in setting of dislodged tracheostomy. questionable syncope.  Total 10 minutes CPR. Transferred to intensive care unit intubated via trach stoma for redo trach 11/2/202.  Redo tracheostomy. 6 XLT. 11/4 Concern overnight for STEMI, hep  gtt started, cards consulted; this was artifact 11/5 changed out distal XLT for proximal due to positioning within trachea  Interim History / Subjective:  No events. Somnolent on precedex, RN to wean.  Objective   Blood pressure (!) 152/70, pulse 79, temperature 98.3 F (36.8 C), temperature source Oral, resp. rate (!) 27, height 5\' 6"  (1.676 m), weight 102.4 kg, SpO2 93 %.    Vent Mode: PRVC FiO2 (%):  [40 %-60 %] 45 % Set Rate:  [20 bmp] 20 bmp Vt Set:  [460 mL] 460 mL PEEP:  [5 cmH20] 5 cmH20 Plateau Pressure:  [16 cmH20-23 cmH20] 23 cmH20   Intake/Output Summary (Last 24 hours) at 10/17/2021 0827 Last data filed at 10/17/2021 0700 Gross per 24 hour  Intake 1853.63 ml  Output 1175 ml  Net 678.63 ml    Filed Weights   10/15/21 0350 10/16/21 0500 10/17/21 0500  Weight: 101.8 kg 102.4 kg 102.4 kg    Examination: No distress Rhonci on R Peak/plats good on vent Minimal bloody secretions No edema Making urine  Cr improved Stable anemia, WBC improved  Resolved Hospital Problem list   RML/RLL PNA (treated) Hypertensive emergency  Assessment & Plan:   Acute respiratory failure with hypoxia 2/2 mixed picture of vocal cord dysfunction/post intubation subglottic and glottic edema/vocal hoarseness  +/- asthmatic exacerbation Tracheostomy dislodgement 11/2 causing cardiac arrest Tracheostomy type change 11/5 HCAP on R, question just aspirated blood AKI on Chronic kidney disease stage IV, improved Type II NSTEMI- see prior discussions, NTD with this DM Normocytic Anemia HTN Anxiety  - Push TC today - Finish out cefepime course, f/u culture data - DC precedex, see how increased seroquel and klonipin dosing  does - Increase basal levemir from 20 to 30 BID, continue SSI/TF coverage - Once on TC x 24h and off precedex can go to floor  Best Practice (right click and "Reselect all SmartList Selections" daily)   Diet/type: tubefeeds DVT prophylaxis: heparin subQ GI  prophylaxis: PPI Lines: PICC Foley:  N/A Code Status:  full code Last date of multidisciplinary goals of care discussion [pending]   Critical care time:   CRITICAL CARE Performed by: Candee Furbish  Total critical care time: 33 minutes  Critical care time was exclusive of separately billable procedures and treating other patients.  Critical care was necessary to treat or prevent imminent or life-threatening deterioration.  Critical care was time spent personally by me on the following activities: development of treatment plan with patient and/or surrogate as well as nursing, discussions with consultants, evaluation of patient's response to treatment, examination of patient, obtaining history from patient or surrogate, ordering and performing treatments and interventions, ordering and review of laboratory studies, ordering and review of radiographic studies, pulse oximetry and re-evaluation of patient's condition.

## 2021-10-17 NOTE — Progress Notes (Signed)
Patient now awake, off all sedation. She states she would not want chest compressions, shocks, or dialysis even if it was to save her life. She is okay with vent. I will update code status. Also has some thrush, nystatin ordered.  Erskine Emery MD PCCM

## 2021-10-18 DIAGNOSIS — J9601 Acute respiratory failure with hypoxia: Secondary | ICD-10-CM | POA: Diagnosis not present

## 2021-10-18 DIAGNOSIS — J383 Other diseases of vocal cords: Secondary | ICD-10-CM | POA: Diagnosis not present

## 2021-10-18 DIAGNOSIS — J384 Edema of larynx: Secondary | ICD-10-CM | POA: Diagnosis not present

## 2021-10-18 LAB — BASIC METABOLIC PANEL
Anion gap: 6 (ref 5–15)
BUN: 42 mg/dL — ABNORMAL HIGH (ref 6–20)
CO2: 27 mmol/L (ref 22–32)
Calcium: 9.7 mg/dL (ref 8.9–10.3)
Chloride: 111 mmol/L (ref 98–111)
Creatinine, Ser: 2 mg/dL — ABNORMAL HIGH (ref 0.44–1.00)
GFR, Estimated: 29 mL/min — ABNORMAL LOW (ref >60)
Glucose, Bld: 247 mg/dL — ABNORMAL HIGH (ref 70–99)
Potassium: 4.7 mmol/L (ref 3.5–5.1)
Sodium: 144 mmol/L (ref 135–145)

## 2021-10-18 LAB — CULTURE, RESPIRATORY W GRAM STAIN: Culture: NORMAL

## 2021-10-18 LAB — GLUCOSE, CAPILLARY
Glucose-Capillary: 228 mg/dL — ABNORMAL HIGH (ref 70–99)
Glucose-Capillary: 180 mg/dL — ABNORMAL HIGH (ref 70–99)
Glucose-Capillary: 221 mg/dL — ABNORMAL HIGH (ref 70–99)
Glucose-Capillary: 238 mg/dL — ABNORMAL HIGH (ref 70–99)
Glucose-Capillary: 180 mg/dL — ABNORMAL HIGH (ref 70–99)
Glucose-Capillary: 159 mg/dL — ABNORMAL HIGH (ref 70–99)

## 2021-10-18 LAB — CBC
HCT: 25.2 % — ABNORMAL LOW (ref 36.0–46.0)
Hemoglobin: 7.3 g/dL — ABNORMAL LOW (ref 12.0–15.0)
MCH: 28.6 pg (ref 26.0–34.0)
MCHC: 29 g/dL — ABNORMAL LOW (ref 30.0–36.0)
MCV: 98.8 fL (ref 80.0–100.0)
Platelets: 283 10*3/uL (ref 150–400)
RBC: 2.55 MIL/uL — ABNORMAL LOW (ref 3.87–5.11)
RDW: 14.3 % (ref 11.5–15.5)
WBC: 11.3 10*3/uL — ABNORMAL HIGH (ref 4.0–10.5)
nRBC: 0.3 % — ABNORMAL HIGH (ref 0.0–0.2)

## 2021-10-18 MED ORDER — CHLORHEXIDINE GLUCONATE 0.12% ORAL RINSE (MEDLINE KIT)
15.0000 mL | Freq: Two times a day (BID) | OROMUCOSAL | Status: DC
  Administered 2021-10-18 – 2021-10-23 (×10): 15 mL via OROMUCOSAL

## 2021-10-18 MED ORDER — DOCUSATE SODIUM 50 MG/5ML PO LIQD: 100.0000 mg | Freq: Two times a day (BID) | ORAL | Status: DC | PRN

## 2021-10-18 MED ORDER — CHLORHEXIDINE GLUCONATE 0.12 % MT SOLN: 15.0000 mL | Freq: Two times a day (BID) | OROMUCOSAL | Status: DC

## 2021-10-18 MED ORDER — ORAL CARE MOUTH RINSE
15.0000 mL | Freq: Two times a day (BID) | OROMUCOSAL | Status: DC
  Administered 2021-10-18 (×2): 15 mL via OROMUCOSAL

## 2021-10-18 MED ORDER — ORAL CARE MOUTH RINSE
15.0000 mL | OROMUCOSAL | Status: DC
  Administered 2021-10-18 – 2021-10-23 (×31): 15 mL via OROMUCOSAL

## 2021-10-18 MED ORDER — INSULIN ASPART 100 UNIT/ML IJ SOLN
5.0000 [IU] | INTRAMUSCULAR | Status: DC
  Administered 2021-10-18 – 2021-10-19 (×9): 5 [IU] via SUBCUTANEOUS

## 2021-10-18 NOTE — Progress Notes (Addendum)
Occupational Therapy Treatment Patient Details Name: Maureen Jones MRN: [id number] DOB: 06/21/1966 Today's Date: 10/18/2021   History of present illness 55 y.o. female admitted 09/20/21 with AMS. 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 CXR with low lung volumes, L>R interstitial opacities. Acute metabolic encephalopathy in setting of HTN urgency, possible PRES. ETT (10/10-10/12, 10/12-10/14). Course complicated by confusion/agitation overnight 10/17. 10/25 trach and bronch with return to vent.  11/2 pt passed out while returning from the bathroom pt with respiratory distress from disloged trach leading to PEA arrest with CPR x 10 min. ETT via stoma.  Trach revised 11/2.  PMHx:HTN, DM.   OT comments  Patient seated in recliner, engaged in LB dressing and simulated bathing (applying lotion) from recliner with increased time with up to mod assist.  She requires cueing for slow breathing throughout session, reinforcement of O2 levels as pt presenting with increased anxiety when having difficulty breathing. She reports fatigued but voices "I want to get stronger". Initiated energy conservation education with rest breaks prior to exhaustion. Pt assisted back to bed with min assist using RW, but requires mod assist to return supine.  Encouraged pt to get up this afternoon after resting.  Will follow acutely.   Pt on Trach Collar at 10L 60% FiO2, SPO2 ranged from 89-92%   Recommendations for follow up therapy are one component of a multi-disciplinary discharge planning process, led by the attending physician.  Recommendations may be updated based on patient status, additional functional criteria and insurance authorization.    Follow Up Recommendations  Home health OT    Assistance Recommended at Discharge Frequent or constant Supervision/Assistance  Equipment Recommendations  BSC/3in1    Recommendations for Other Services Speech consult    Precautions / Restrictions Precautions Precautions:  Fall Precaution Comments: nocturnal vent via trach, picc, cortrak Restrictions Weight Bearing Restrictions: No       Mobility Bed Mobility Overal bed mobility: Needs Assistance Bed Mobility: Sit to Supine       Sit to supine: Mod assist   General bed mobility comments: requires assist for BLEs back to supine, adjusting trunk to midline once supine    Transfers Overall transfer level: Needs assistance Equipment used: Rolling walker (2 wheels) Transfers: Sit to/from Stand;Bed to chair/wheelchair/BSC Sit to Stand: Min assist   Step pivot transfers: Min guard       General transfer comment: min assist to power up from recliner, cueing for hand placement; stepping to EOB with min guard     Balance Overall balance assessment: Needs assistance Sitting-balance support: No upper extremity supported;Feet supported Sitting balance-[name]: Fair Sitting balance - Comments: EOB without physical assist   Standing balance support: Bilateral upper extremity supported;During functional activity Standing balance-[name]: Poor Standing balance comment: RW for standing and mobility                           ADL either performed or assessed with clinical judgement   ADL Overall ADL's : Needs assistance/impaired Eating/Feeding: NPO           Lower Body Bathing: Minimal assistance Lower Body Bathing Details (indicate cue type and reason): able to apply lotion to BLEs, min assist in stand     Lower Body Dressing: Moderate assistance;Sit to/from stand Lower Body Dressing Details (indicate cue type and reason): able to don /doff socks, min assist in standing but relies on BUE support Toilet Transfer: Minimal assistance;Stand-pivot;Rolling walker (2 wheels) Toilet Transfer Details (indicate cue  type and reason): simulated from recliner         Functional mobility during ADLs: Minimal assistance;+2 for safety/equipment;Rolling walker (2 wheels) General ADL  Comments: pt limited by decreased activity tolerance and endurance    Extremity/Trunk Assessment Upper Extremity Assessment Upper Extremity Assessment: Generalized weakness            Vision       Perception     Praxis      Cognition Arousal/Alertness: Awake/alert Behavior During Therapy: WFL for tasks assessed/performed Overall Cognitive Status: Within Functional Limits for tasks assessed                                 General Comments: pt responding appropraitely during session, mouthing responses with increased time.          Exercises     Shoulder Instructions       General Comments VSS on TC    Pertinent Vitals/ Pain       Pain Assessment: Faces Pain Score: 5  Faces Pain Scale: Hurts little more Pain Location: chest, throat Pain Descriptors / Indicators: Sore;Aching Pain Intervention(s): Limited activity within patient's tolerance;Monitored during session;Repositioned  Home Living                                          Prior Functioning/Environment              Frequency  Min 2X/week        Progress Toward Goals  OT Goals(current goals can now be found in the care plan section)  Progress towards OT goals: Progressing toward goals  Acute Rehab OT Goals OT Goal Formulation: With patient Time For Goal Achievement: 10/28/21 Potential to Achieve Goals: Good  Plan Discharge plan remains appropriate;Frequency remains appropriate    Co-evaluation                 AM-PAC OT "6 Clicks" Daily Activity     Outcome Measure   Help from another person eating meals?: Total Help from another person taking care of personal grooming?: A Little Help from another person toileting, which includes using toliet, bedpan, or urinal?: A Lot Help from another person bathing (including washing, rinsing, drying)?: A Lot Help from another person to put on and taking off regular upper body clothing?: A Little Help from  another person to put on and taking off regular lower body clothing?: A Lot 6 Click Score: 13    End of Session Equipment Utilized During Treatment: Oxygen (via TC)  OT Visit Diagnosis: Other abnormalities of gait and mobility (R26.89);Other symptoms and signs involving cognitive function   Activity Tolerance Patient limited by fatigue   Patient Left in bed;with call bell/phone within reach;with nursing/sitter in room   Nurse Communication Mobility status        Time: 4098-1191 OT Time Calculation (min): 31 min  Charges: OT General Charges $OT Visit: 1 Visit OT Treatments $Self Care/Home Management : 23-37 mins  [place] Pager [phone] Office [place] 10/18/2021, 11:04 AM

## 2021-10-18 NOTE — Progress Notes (Signed)
RT called by RN to place pt back on full vent support due to increased B/P and increased WOB. Pt tolerating full support well at this time.

## 2021-10-18 NOTE — Progress Notes (Signed)
NAMECinnamon Jones, MRN:  [id number], DOB:  10/01/1966, LOS: 36 ADMISSION DATE:  09/20/2021, CONSULTATION DATE: 10/10 REFERRING MD:  Dr. Laverta Baltimore, CHIEF COMPLAINT:  AMS   History of Present Illness:  55 y/o F who presented to [facility] on 10/10 with reports of acute altered mental status secondary to hypertensive emergency with hypoxic respiratory failure related to VCD/post intubation subglottic and glottic edema/vocal hoarseness  +/- asthmatic exacerbation.  Ultimately required trach placement.   Transferred back to ICU on 11/2 after trach became dislodged on floor, resulting in PEA arrest with 10 min CPR and 2mg  epi. ROSC obtained. Trach revised to 6.0 XLT.  She remains critically ill.  Pertinent  Medical History  HTN DM   Significant Hospital Events: Including procedures, antibiotic start and stop dates in addition to other pertinent events   10/10 Admit with acute encephalopathy, hypertensive 10/12 extubated, increased WOB, re-intubated 10/14 extubated , lingering stridor, Received racemic epi x2 over the last 24 hours + another dose of Decadron, Mild agitation requiring restart of Precedex 10/15 swallow evaluation >> dysphagia 3 diet 10/20 decompensated after switch to PO steroids 10/21 better after CAT and back on IV steroids 10/22 agitated back on  BIPAP 10/25 stridor again. Marked work of breathing. Intubated, airway noises, stridor and wheezing completely resolved post intubation. Spoke to ENT who will follow after trach. Trach placed by Dr Vaughan Browner. Seroquel increased to 100 qhs, changes solumedrol to prednisone to begin taper, rested on propofol over night. Tubefeeds started. RUE PICC placed  10/26 prop weaning off attempting ATC  11/2 PEA arrest in setting of dislodged tracheostomy. questionable syncope.  Total 10 minutes CPR. Transferred to intensive care unit intubated via trach stoma for redo trach 11/2/202.  Redo tracheostomy. 6 XLT. 11/4 Concern overnight for STEMI, hep  gtt started, cards consulted; this was artifact 11/5 changed out distal XLT for proximal due to positioning within trachea  Interim History / Subjective:  No events. awake, alert and interactive  Objective   Blood pressure (!) 170/82, pulse 90, temperature 98.6 F (37 C), temperature source Oral, resp. rate (!) 23, height 5\' 6"  (1.676 m), weight 100.9 kg, SpO2 92 %.    Vent Mode: PSV;CPAP FiO2 (%):  [40 %-62 %] 60 % Set Rate:  [20 bmp-60 bmp] 20 bmp Vt Set:  [460 mL] 460 mL PEEP:  [5 cmH20] 5 cmH20 Pressure Support:  [5 cmH20] 5 cmH20   Intake/Output Summary (Last 24 hours) at 10/18/2021 1045 Last data filed at 10/18/2021 4259 Gross per 24 hour  Intake 1553.99 ml  Output 2328 ml  Net -774.01 ml   Filed Weights   10/16/21 0500 10/17/21 0500 10/18/21 0407  Weight: 102.4 kg 102.4 kg 100.9 kg    Examination: On vent No distress Clear breath sounds S1-S2 appreciated No edema, no clubbing  Labs reviewed  Resolved Hospital Problem list   RML/RLL PNA (treated) Hypertensive emergency  Assessment & Plan:   Acute hypoxemic respiratory failure with history of vocal cord dysfunction Subglottic and glottic edema -Status post tracheostomy -Tracheostomy dislodgment leading to PEA arrest on 11/2-CPR for 10 minutes -Continue ventilator support  H CAP on the right -Continue antibiotics -on cefepime  Diabetes -Continue insulin -Add insulin coverage to tube feeds  Leave off Precedex Attempt trach collar Will transfer to medical service if tolerating trach collar   Best Practice (right click and "Reselect all SmartList Selections" daily)   Diet/type: tubefeeds DVT prophylaxis: heparin subQ GI prophylaxis: PPI Lines: PICC Foley:  N/A Code Status:  full code Last date of multidisciplinary goals of care discussion [pending]   The patient is critically ill with multiple organ systems failure and requires high complexity decision making for assessment and support,  frequent evaluation and titration of therapies, application of advanced monitoring technologies and extensive interpretation of multiple databases. Critical Care Time devoted to patient care services described in this note independent of APP/resident time (if applicable)  is 32 minutes.   Sherrilyn Rist MD [place] [facility] Personal pager: See Amion If unanswered, please page CCM On-call: [phone]

## 2021-10-18 NOTE — Progress Notes (Signed)
Pt's SpO2 dropped to low 80's with increased WOB noted. Pt placed back on documented vent settings at this time.  Pt tolerating well.

## 2021-10-18 NOTE — Progress Notes (Addendum)
Speech Language Pathology Treatment: Dysphagia;Passy Muir Speaking valve  Patient Details Name: Maureen Jones MRN: [id number] DOB: [birth year] Today's Date: 10/18/2021 Time: 1660-6301 SLP Time Calculation (min) (ACUTE ONLY): 24 min  Assessment / Plan / Recommendation Clinical Impression  Pt was seen for PMSV and dysphagia treatment. Pt was lethargic and quickly fell asleep if verbal/tactile stimuli were removed. Cuff was inflated upon SLP's arrival. Pt presented with vitals of RR 28, SpO2 92, and HR 92 at baseline and RR 29, SpO2 90, and HR 17 upon cuff deflation. Pt was able to access upper airway to demonstrate limited blowing with finger occlusion. Pt tolerated PMSV for <1 minute with vitals ranging RR 30-35, SpO2 88-89, and HR 90-92; pt was unable to vocalize/verbalize with placement. PMSV was removed due to desaturation and increasing RR. Vitals returned to baseline with rest break. Air trapping was noted with subsequent intermittent PMSV placement/removal, suggesting reduced upper airway patency. Pt tolerated puree boluses and ice chips without overt s/sx of aspiration, or symptoms of pharyngeal residue. Additional boluses were deferred due to pt's progressive lethargy, and pt's cuff was re-inflated per RN's request. PMSV may be used with SLP only. Pt's NPO status will be maintained, but pt may have ice chips after oral care and limited boluses of floor-stock purees. SLP will continue to follow pt.   HPI HPI: Pt is a 55 y/o female admitted with AMS 10/10. Stroke w/u negative, dx with acute metabolic encephalopathy in the setting of hypertensive emergency. ETT 10/10-10/12; reintubated 10/12 d/t increased WOB, not mobilizing secretions, stridor, received steroids, extubated 10/14 with lingering stridor, which was improving 10/15. ENT scoped pt 10/18: "Edema of the glottis and subglottic larynx following recent intubation and then repeat intubation.  There is no evidence of cord paralysis or any tumor  present.  There is currently no stridor.  Allow more time for resolution of the swelling.  Unfortunately she is at risk for developing subglottic stenosis." SLP followed pt 10/15-10/24. MBS 10/19: trace posterior spillage, mild pharyngeal delay, trace transient penetration; regular texture diet with thin liquids recommended and SLP ultimately signed off with pt demonstrating adequate tolerance. 10/25: Stridor again noted; pt intubated and trach placed.  MBS 10/27: oropharyngeal dysphagia characterized by reduced bolus cohesion, reduced hyolaryngeal elevation, reduced anterior laryngeal movement, a pharyngeal delay, penetration (PAS 3, 5) with thin liquids. A dyspahgia 3 diet with nectar thick liquids was initiated and she was subsequently advanced to regular and thin liquids on 10/30. Pt with respiratory distress from disloged trach on 11/2 leading to PEA arrest with CPR x 10 min. ETT via stoma. Trach revised 11/2; #6XLT.  Cortrak placed 11/4.      SLP Plan  Continue with current plan of care      Recommendations for follow up therapy are one component of a multi-disciplinary discharge planning process, led by the attending physician.  Recommendations may be updated based on patient status, additional functional criteria and insurance authorization.    Recommendations  Diet recommendations: NPO (with allowance of ice chips and limited boluses of floor-stock puree) Medication Administration: Via alternative means Compensations: Small sips/bites;Slow rate Postural Changes and/or Swallow Maneuvers: Seated upright 90 degrees      Patient may use Passy-Muir Speech Valve: with SLP only PMSV Supervision: Full MD: Please consider changing trach tube to : Smaller size         Oral Care Recommendations: Oral care BID Follow up Recommendations: Home health SLP SLP Visit Diagnosis: Dysphagia, oropharyngeal phase (R13.12);Aphonia (R49.1) Plan: Continue with current  plan of care       Maureen Fultz I.  Maureen Jones, [place], [place] Office number [phone] Pager Maureen Jones  10/18/2021, 3:29 PM

## 2021-10-18 NOTE — Progress Notes (Signed)
Pt placed on 60% ATC and is tolerating well at this time. RN made aware.

## 2021-10-18 NOTE — Progress Notes (Signed)
Physical Therapy Treatment Patient Details Name: Maureen Jones MRN: [id number] DOB: 09-13-1966 Today's Date: 10/18/2021   History of Present Illness 55 y.o. female admitted 09/20/21 with AMS. 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 CXR with low lung volumes, L>R interstitial opacities. Acute metabolic encephalopathy in setting of HTN urgency, possible PRES. ETT (10/10-10/12, 10/12-10/14). Course complicated by confusion/agitation overnight 10/17. 10/25 trach and bronch with return to vent.  11/2 pt passed out while returning from the bathroom pt with respiratory distress from disloged trach leading to PEA arrest with CPR x 10 min. ETT via stoma.  Trach revised 11/2.  PMHx:HTN, DM.    PT Comments    Pt pleasant and eager to get OOB, walk and return to PLOF. Pt on 55% FIO2 with sats 88-93% throughout session and able to progress to hall ambulation limited by fatigue. Encouraged OOB to [facility] throughout the day as well as increased mobility with nursing assist. Will continue to follow to return to mod I level of function.     Recommendations for follow up therapy are one component of a multi-disciplinary discharge planning process, led by the attending physician.  Recommendations may be updated based on patient status, additional functional criteria and insurance authorization.  Follow Up Recommendations  Home health PT     Assistance Recommended at Discharge Intermittent Supervision/Assistance  Equipment Recommendations  Rolling walker (2 wheels);BSC/3in1    Recommendations for Other Services       Precautions / Restrictions Precautions Precautions: Fall Precaution Comments: nocturnal vent via trach, picc, cortrak Restrictions Weight Bearing Restrictions: No     Mobility  Bed Mobility Overal bed mobility: Modified Independent             General bed mobility comments: supervision for lines only without physical assist to rise from surface    Transfers Overall transfer  level: Needs assistance   Transfers: Sit to/from Stand Sit to Stand: Min guard           General transfer comment: cues for hand placement with guarding to rise. Initial chest pain with standing requiring sitting and 2nd trial to maintain standing    Ambulation/Gait Ambulation/Gait assistance: Min assist Gait Distance (Feet): 90 Feet Assistive device: Rolling walker (2 wheels) Gait Pattern/deviations: Step-through pattern;Decreased stride length   Gait velocity interpretation: <1.8 ft/sec, indicate of risk for recurrent falls   General Gait Details: 5 standing rest breaks with SPO2 88-92% on 55% on 15L. Pt able to walk 90' with RW , cues for position in RW and chair follow, limited by fatigue   Stairs             Wheelchair Mobility    Modified Rankin (Stroke Patients Only)       Balance Overall balance assessment: Needs assistance Sitting-balance support: No upper extremity supported;Feet supported Sitting balance-[name]: Fair Sitting balance - Comments: EOB without physical assist   Standing balance support: Bilateral upper extremity supported;During functional activity Standing balance-[name]: Poor Standing balance comment: RW for standing and gait                            Cognition Arousal/Alertness: Awake/alert Behavior During Therapy: WFL for tasks assessed/performed Overall Cognitive Status: Within Functional Limits for tasks assessed                                 General Comments: pt responding appropriately to commands  and appropriately mouthing responses        Exercises      General Comments        Pertinent Vitals/Pain Pain Score: 5  Pain Location: chest Pain Descriptors / Indicators: Sore;Aching Pain Intervention(s): Limited activity within patient's tolerance;Monitored during session;Repositioned    Home Living                          Prior Function            PT Goals  (current goals can now be found in the care plan section) Progress towards PT goals: Progressing toward goals    Frequency    Min 3X/week      PT Plan Current plan remains appropriate    Co-evaluation              AM-PAC PT "6 Clicks" Mobility   Outcome Measure  Help needed turning from your back to your side while in a flat bed without using bedrails?: None Help needed moving from lying on your back to sitting on the side of a flat bed without using bedrails?: None Help needed moving to and from a bed to a chair (including a wheelchair)?: A Little Help needed standing up from a chair using your arms (e.g., wheelchair or bedside chair)?: A Little Help needed to walk in hospital room?: A Little Help needed climbing 3-5 steps with a railing? : A Lot 6 Click Score: 19    End of Session Equipment Utilized During Treatment: Gait belt;Oxygen Activity Tolerance: Patient tolerated treatment well Patient left: in chair;with call bell/phone within reach;with chair alarm set Nurse Communication: Mobility status PT Visit Diagnosis: Other abnormalities of gait and mobility (R26.89);Difficulty in walking, not elsewhere classified (R26.2)     Time: 4967-5916 PT Time Calculation (min) (ACUTE ONLY): 32 min  Charges:  $Gait Training: 8-22 mins $Therapeutic Activity: 8-22 mins                     Maureen Jones P, PT Acute Rehabilitation Services Pager: [phone] Office: [place] 10/18/2021, 10:06 AM

## 2021-10-19 ENCOUNTER — Inpatient Hospital Stay (HOSPITAL_COMMUNITY): Payer: Medicare Other

## 2021-10-19 DIAGNOSIS — Z978 Presence of other specified devices: Secondary | ICD-10-CM | POA: Diagnosis not present

## 2021-10-19 DIAGNOSIS — G9340 Encephalopathy, unspecified: Secondary | ICD-10-CM | POA: Diagnosis not present

## 2021-10-19 DIAGNOSIS — J9601 Acute respiratory failure with hypoxia: Secondary | ICD-10-CM | POA: Diagnosis not present

## 2021-10-19 DIAGNOSIS — J384 Edema of larynx: Secondary | ICD-10-CM | POA: Diagnosis not present

## 2021-10-19 LAB — GLUCOSE, CAPILLARY
Glucose-Capillary: 152 mg/dL — ABNORMAL HIGH (ref 70–99)
Glucose-Capillary: 174 mg/dL — ABNORMAL HIGH (ref 70–99)
Glucose-Capillary: 210 mg/dL — ABNORMAL HIGH (ref 70–99)
Glucose-Capillary: 223 mg/dL — ABNORMAL HIGH (ref 70–99)
Glucose-Capillary: 237 mg/dL — ABNORMAL HIGH (ref 70–99)
Glucose-Capillary: 265 mg/dL — ABNORMAL HIGH (ref 70–99)

## 2021-10-19 LAB — CBC
HCT: 24.4 % — ABNORMAL LOW (ref 36.0–46.0)
Hemoglobin: 7 g/dL — ABNORMAL LOW (ref 12.0–15.0)
MCH: 28.7 pg (ref 26.0–34.0)
MCHC: 28.7 g/dL — ABNORMAL LOW (ref 30.0–36.0)
MCV: 100 fL (ref 80.0–100.0)
Platelets: 279 10*3/uL (ref 150–400)
RBC: 2.44 MIL/uL — ABNORMAL LOW (ref 3.87–5.11)
RDW: 14.6 % (ref 11.5–15.5)
WBC: 10.2 10*3/uL (ref 4.0–10.5)
nRBC: 0.2 % (ref 0.0–0.2)

## 2021-10-19 LAB — BASIC METABOLIC PANEL
Anion gap: 7 (ref 5–15)
BUN: 41 mg/dL — ABNORMAL HIGH (ref 6–20)
CO2: 27 mmol/L (ref 22–32)
Calcium: 9.9 mg/dL (ref 8.9–10.3)
Chloride: 113 mmol/L — ABNORMAL HIGH (ref 98–111)
Creatinine, Ser: 1.99 mg/dL — ABNORMAL HIGH (ref 0.44–1.00)
GFR, Estimated: 29 mL/min — ABNORMAL LOW (ref >60)
Glucose, Bld: 189 mg/dL — ABNORMAL HIGH (ref 70–99)
Potassium: 4.7 mmol/L (ref 3.5–5.1)
Sodium: 147 mmol/L — ABNORMAL HIGH (ref 135–145)

## 2021-10-19 MED ORDER — SODIUM CHLORIDE 0.9 % IV SOLN: INTRAVENOUS | Status: DC | PRN

## 2021-10-19 MED ORDER — FREE WATER
300.0000 mL | Freq: Four times a day (QID) | Status: DC
  Administered 2021-10-19 (×2): 300 mL

## 2021-10-19 MED ORDER — HYDRALAZINE HCL 50 MG PO TABS
50.0000 mg | ORAL_TABLET | Freq: Two times a day (BID) | ORAL | Status: DC
  Administered 2021-10-19 (×2): 50 mg
  Filled 2021-10-19 (×3): qty 1

## 2021-10-19 NOTE — Progress Notes (Signed)
Modified Barium Swallow Progress Note  Patient Details  Name: Maureen Jones MRN: [id number] Date of Birth: August 07, 1966  Today's Date: 10/19/2021  Modified Barium Swallow completed.  Full report located under Chart Review in the Imaging Section.  Brief recommendations include the following:  Clinical Impression  Pt with trach collar was seen for an MBS. MBS completed without PMV d/t pt inability to tolerate valve this date. Overall, pt presents with mild pharyngeal dysphagia resulting in penetration above the level of the vocal folds which does not clear spontaneously (PAS 3) with small and large sips of thin liquid secondary to delayed swallow initation at the level of the vallecula resulting in impaired swallow timing. Pt additionally exhibited reduced anterior laryngeal mobility and laryngeal elevation however this did not appear to diminish airway protection. Across consistencies, pt exhibited no oral phase impairments. Pharyngeally, pt exhibited aforementioned penetration during swallow with small and large sips of thin liquid however exhibited no impairments with nectar thick liquid or regular solids. SLP unable to test pt for stimulability of compensatory strategies (throat clear, breath holding) d/t current trach without PMV status. Recommend Regular/Nectar thick liquid diet, administering medications whole in puree. SLP will continue to follow pt for management of diet tolerance and upgraded PO trials as appropriate.   Swallow Evaluation Recommendations       SLP Diet Recommendations: Regular solids;Nectar thick liquid   Liquid Administration via: Straw;Cup   Medication Administration: Whole meds with puree   Supervision: Patient able to self feed   Compensations: Slow rate;Small sips/bites   Postural Changes: Remain semi-upright after after feeds/meals (Comment);Seated upright at 90 degrees   Oral Care Recommendations: Oral care BID;Patient independent with oral care   Other  Recommendations: Order thickener from pharmacy    Houston Siren 10/19/2021,3:56 PM  Orbie Pyo Colvin Caroli.Ed Risk analyst [phone] Office [phone]

## 2021-10-19 NOTE — Progress Notes (Signed)
Speech Language Pathology Treatment: Dysphagia;Passy Muir Speaking valve  Patient Details Name: Maureen Jones MRN: [id number] DOB: Jan 31, 1966 Today's Date: 10/19/2021 Time: 2585-2778 SLP Time Calculation (min) (ACUTE ONLY): 24 min  Assessment / Plan / Recommendation Clinical Impression  PMSV Pt awake, alert, seated upright in chair on trach collar. Pt able to mouth words, but no leak speech noted.  Cuff was deflated on SLP arrival and throughout session.  Pt did not exhibit cough response to deflation. Pt was unable to pass air through upper airway to thumb occlusion or with PMV placement.  Back pressure noted on removal of valve.  Intermittent wet/congested vocal quality. Pt coughed with no secretions at trach.  Pt declined tracheal suctioning, did not feel she needed it.  Cuff left deflated at end of session, RN informed.  Consider trach downsize and/or cuffless trach when medically appropriate.  SWALLOWING Pt eager for PO trials with excellent participation.  Pt tolerated thin liquid by cup, puree, and regular solid textures without any clinical s/s of aspiration.  Pt exhibited excellent oral clearance of solids.  With straw sip there was slight increase in wet breath sounds on 1 of 2 trials.  Pt's swallow function has remained relatively strong despite respiratory changes this admission.  Most recent MBSS 10/27 post tracheostomy with recommendations for mechanical soft solids and nectar thick liquids. Given change in pt status since last evaluation (change in trach 11/5, code 11/2, intolerance of PMV) recommend MBSS prior to initiation of PO diet. Pt may have ice chips and small sips of water after good oral care, in moderation, when fully awake/alert, with upright positioning and supervision, pending results of MBSS.    HPI HPI: Pt is a 55 y/o female admitted with AMS 10/10. Stroke w/u negative, dx with acute metabolic encephalopathy in the setting of hypertensive emergency. ETT 10/10-10/12;  reintubated 10/12 d/t increased WOB, not mobilizing secretions, stridor, received steroids, extubated 10/14 with lingering stridor, which was improving 10/15. ENT scoped pt 10/18: "Edema of the glottis and subglottic larynx following recent intubation and then repeat intubation.  There is no evidence of cord paralysis or any tumor present.  There is currently no stridor.  Allow more time for resolution of the swelling.  Unfortunately she is at risk for developing subglottic stenosis." SLP followed pt 10/15-10/24. MBS 10/19: trace posterior spillage, mild pharyngeal delay, trace transient penetration; regular texture diet with thin liquids recommended and SLP ultimately signed off with pt demonstrating adequate tolerance. 10/25: Stridor again noted; pt intubated and trach placed.  MBS 10/27: oropharyngeal dysphagia characterized by reduced bolus cohesion, reduced hyolaryngeal elevation, reduced anterior laryngeal movement, a pharyngeal delay, penetration (PAS 3, 5) with thin liquids. A dyspahgia 3 diet with nectar thick liquids was initiated and she was subsequently advanced to regular and thin liquids on 10/30. Pt with respiratory distress from disloged trach on 11/2 leading to PEA arrest with CPR x 10 min. ETT via stoma. Trach revised 11/2; #6XLT distal.  Cortrak placed 11/4. Trach changed from 6XL distal to 6XL proximal 11/5.      SLP Plan  Continue with current plan of care;MBS      Recommendations for follow up therapy are one component of a multi-disciplinary discharge planning process, led by the attending physician.  Recommendations may be updated based on patient status, additional functional criteria and insurance authorization.    Recommendations  Diet recommendations: NPO Medication Administration: Via alternative means      Patient may use Passy-Muir Speech Valve: with SLP only MD:  Please consider changing trach tube to : Smaller size;Cuffless         Oral Care Recommendations: Oral  care QID Follow Up Recommendations: Other (comment) (Continue ST at next level of care) SLP Visit Diagnosis: Dysphagia, oropharyngeal phase (R13.12);Aphonia (R49.1) Plan: Continue with current plan of care;MBS       [place], MA, [place] Office: [phone]  10/19/2021, 10:33 AM

## 2021-10-19 NOTE — Progress Notes (Signed)
Pt continues to have nausea and dry heaving.  Tube feedings turned off at this time.

## 2021-10-19 NOTE — Progress Notes (Signed)
Pt placed on 60% ATC and is tolerating well at this Time. RN made aware.

## 2021-10-19 NOTE — Progress Notes (Signed)
Physical Therapy Treatment Patient Details Name: Maureen Jones MRN: [id number] DOB: 06/06/1966 Today's Date: 10/19/2021   History of Present Illness 55 y.o. female admitted 09/20/21 with AMS. 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 CXR with low lung volumes, L>R interstitial opacities. Acute metabolic encephalopathy in setting of HTN urgency, possible PRES. ETT (10/10-10/12, 10/12-10/14). Course complicated by confusion/agitation overnight 10/17. 10/25 trach and bronch with return to vent.  11/2 pt passed out while returning from the bathroom pt with respiratory distress from disloged trach leading to PEA arrest with CPR x 10 min. ETT via stoma.  Trach revised 11/2.  PMHx:HTN, DM.    PT Comments    Pt seen on vent at [facility] 40% FiO2 with SPO2 95%. Pt able to stand, pivot, perform limited gait and HEP all limited by fatigue. Pt also incontinent of stool today which is not normal for her. She remains very engaged and willing to participate but quicker to fatigue than prior sessions. Will continue to follow with continued hopes for [facility] as she recovers and maintains time off the vent otherwise CIR may need to be considered.     Recommendations for follow up therapy are one component of a multi-disciplinary discharge planning process, led by the attending physician.  Recommendations may be updated based on patient status, additional functional criteria and insurance authorization.  Follow Up Recommendations  Home health PT     Assistance Recommended at Discharge Intermittent Supervision/Assistance  Equipment Recommendations  Rolling walker (2 wheels);BSC/3in1    Recommendations for Other Services       Precautions / Restrictions Precautions Precautions: Fall Precaution Comments: vent via trach, picc, cortrak     Mobility  Bed Mobility Overal bed mobility: Needs Assistance Bed Mobility: Rolling;Sidelying to Sit Rolling: Supervision Sidelying to sit: Min guard;HOB elevated        General bed mobility comments: HOB 10 degrees with guarding for lines. Pt incontinent of stool and rolled to side for pericare and able to rise to sitting without physical assist    Transfers Overall transfer level: Needs assistance   Transfers: Sit to/from Stand;Bed to chair/wheelchair/BSC Sit to Stand: Min guard     Step pivot transfers: Min guard     General transfer comment: guarding with cues for hand placement and assist for lines    Ambulation/Gait Ambulation/Gait assistance: Min assist Gait Distance (Feet): 6 Feet Assistive device: Rolling walker (2 wheels) Gait Pattern/deviations: Step-through pattern;Decreased stride length   Gait velocity interpretation: 1.31 - 2.62 ft/sec, indicative of limited community ambulator   General Gait Details: pt able to walk 3' forward and back and 2' forward and back limited by fatigue and vent   Stairs             Wheelchair Mobility    Modified Rankin (Stroke Patients Only)       Balance Overall balance assessment: Needs assistance Sitting-balance support: No upper extremity supported;Feet supported Sitting balance-[name]: Good Sitting balance - Comments: EOB without physical assist   Standing balance support: Bilateral upper extremity supported Standing balance-[name]: Poor Standing balance comment: RW for standing and mobility                            Cognition Arousal/Alertness: Awake/alert Behavior During Therapy: WFL for tasks assessed/performed Overall Cognitive Status: Impaired/Different from baseline Area of Impairment: Awareness  Awareness: Emergent   General Comments: incontinent of bowel and unaware        Exercises General Exercises - Lower Extremity Long Arc Quad: AROM;Seated;Both;10 reps Hip Flexion/Marching: AROM;Seated;Both;15 reps    General Comments        Pertinent Vitals/Pain Pain Assessment: Faces Pain Score: 4   Pain Location: chest Pain Descriptors / Indicators: Sore;Aching Pain Intervention(s): Limited activity within patient's tolerance;Monitored during session;Repositioned    Home Living                          Prior Function            PT Goals (current goals can now be found in the care plan section) Progress towards PT goals: Progressing toward goals    Frequency    Min 3X/week      PT Plan Current plan remains appropriate    Co-evaluation              AM-PAC PT "6 Clicks" Mobility   Outcome Measure  Help needed turning from your back to your side while in a flat bed without using bedrails?: None Help needed moving from lying on your back to sitting on the side of a flat bed without using bedrails?: None Help needed moving to and from a bed to a chair (including a wheelchair)?: A Little Help needed standing up from a chair using your arms (e.g., wheelchair or bedside chair)?: A Little Help needed to walk in hospital room?: A Little Help needed climbing 3-5 steps with a railing? : A Lot 6 Click Score: 19    End of Session Equipment Utilized During Treatment: Other (comment) (vent) Activity Tolerance: Patient tolerated treatment well;Patient limited by fatigue Patient left: in chair;with call bell/phone within reach;with chair alarm set Nurse Communication: Mobility status PT Visit Diagnosis: Other abnormalities of gait and mobility (R26.89);Difficulty in walking, not elsewhere classified (R26.2)     Time: 1962-2297 PT Time Calculation (min) (ACUTE ONLY): 31 min  Charges:  $Gait Training: 8-22 mins $Therapeutic Exercise: 8-22 mins                     Reakwon Barren P, PT Acute Rehabilitation Services Pager: [phone] Office: [place] 10/19/2021, 9:19 AM

## 2021-10-19 NOTE — Progress Notes (Signed)
Inpatient Diabetes Program Recommendations  AACE/ADA: New Consensus Statement on Inpatient Glycemic Control (2015)  Target Ranges:  Prepandial:   less than 140 mg/dL      Peak postprandial:   less than 180 mg/dL (1-2 hours)      Critically ill patients:  140 - 180 mg/dL   Lab Results  Component Value Date   GLUCAP 223 (H) 10/19/2021   HGBA1C 12.7 (H) 09/20/2021    Review of Glycemic Control Results for Maureen Jones, Maureen Jones (MRN [id number]) as of 10/19/2021 10:53  Ref. Range 10/18/2021 07:16 10/18/2021 11:20 10/18/2021 15:15 10/18/2021 19:41 10/18/2021 23:11 10/19/2021 03:23 10/19/2021 07:39  Glucose-Capillary Latest Ref Range: 70 - 99 mg/dL 180 (H) 221 (H) 238 (H) 180 (H) 159 (H) 152 (H) 223 (H)   Diabetes type 2 Current orders for Inpatient glycemic control:  Novolog 5 units q4 Tube Feed Coverage Levemir 30 units bid Novolog 0-20 units Q4 hours  Inpatient Diabetes Program Recommendations:   Note swallow study ordered. Still currently on Vital AFv55 ml/hour  - Consider increasing Novolog Tube Feed Coverage to 8 units Q4 hours  Thanks,  Tama Headings RN, MSN, BC-ADM Inpatient Diabetes Coordinator Team Pager [phone] (8a-5p)

## 2021-10-19 NOTE — Progress Notes (Signed)
NAMEGeralyn Jones, MRN:  [id number], DOB:  07/30/1966, LOS: 19 ADMISSION DATE:  09/20/2021, CONSULTATION DATE: 10/10 REFERRING MD:  Dr. Laverta Baltimore, CHIEF COMPLAINT:  AMS   History of Present Illness:  55 y/o F who presented to [facility] on 10/10 with reports of acute altered mental status secondary to hypertensive emergency with hypoxic respiratory failure related to VCD/post intubation subglottic and glottic edema/vocal hoarseness  +/- asthmatic exacerbation.  Ultimately required trach placement.   Transferred back to ICU on 11/2 after trach became dislodged on floor, resulting in PEA arrest with 10 min CPR and 2mg  epi. ROSC obtained. Trach revised to 6.0 XLT.  She remains critically ill.  Pertinent  Medical History  HTN DM   Significant Hospital Events: Including procedures, antibiotic start and stop dates in addition to other pertinent events   10/10 Admit with acute encephalopathy, hypertensive 10/12 extubated, increased WOB, re-intubated 10/14 extubated , lingering stridor, Received racemic epi x2 over the last 24 hours + another dose of Decadron, Mild agitation requiring restart of Precedex 10/15 swallow evaluation >> dysphagia 3 diet 10/20 decompensated after switch to PO steroids 10/21 better after CAT and back on IV steroids 10/22 agitated back on  BIPAP 10/25 stridor again. Marked work of breathing. Intubated, airway noises, stridor and wheezing completely resolved post intubation. Spoke to ENT who will follow after trach. Trach placed by Dr Vaughan Browner. Seroquel increased to 100 qhs, changes solumedrol to prednisone to begin taper, rested on propofol over night. Tubefeeds started. RUE PICC placed  10/26 prop weaning off attempting ATC  11/2 PEA arrest in setting of dislodged tracheostomy. questionable syncope.  Total 10 minutes CPR. Transferred to intensive care unit intubated via trach stoma for redo trach 11/2/202.  Redo tracheostomy. 6 XLT. 11/4 Concern overnight for STEMI, hep  gtt started, cards consulted; this was artifact 11/5 changed out distal XLT for proximal due to positioning within trachea 11/8 tolerated trach collar for about 6 hours yesterday 11/7, placed back on vent   Interim History / Subjective:  No events. awake, alert and interactive No overnight events  Objective   Blood pressure (!) 159/64, pulse 84, temperature 99.6 F (37.6 C), temperature source Oral, resp. rate (!) 0, height 5\' 6"  (1.676 m), weight 99.1 kg, SpO2 97 %.    Vent Mode: PRVC FiO2 (%):  [40 %-60 %] 40 % Set Rate:  [20 bmp] 20 bmp Vt Set:  [460 mL] 460 mL PEEP:  [5 cmH20] 5 cmH20 Plateau Pressure:  [18 cmH20] 18 cmH20   Intake/Output Summary (Last 24 hours) at 10/19/2021 0811 Last data filed at 10/19/2021 0600 Gross per 24 hour  Intake 1480 ml  Output 1625 ml  Net -145 ml   Filed Weights   10/17/21 0500 10/18/21 0407 10/19/21 0327  Weight: 102.4 kg 100.9 kg 99.1 kg    Examination: On vent this morning No respiratory distress Some rhonchi S1-S2 appreciated No edema or clubbing  Labs reviewed Anemia, elevated BUN/creatinine  Resolved Hospital Problem list   RML/RLL PNA (treated) Hypertensive emergency  Assessment & Plan:   Acute hypoxemic respiratory failure with history of vocal cord dysfunction Subglottic and glottic edema -S/p tracheostomy -Tracheostomy dislodgment leading to PEA arrest on 11/2-CPR for 10 minutes -Continue ventilator support -Required ventilator at night  HCAP -Continue antibiotics -Today will be day 7 of antibiotics -Stop antibiotics after today's doses  Diabetes -Continue insulin -Insulin coverage added to tube feeds  Continue to attempt trach collar Will transfer to medical service Will  leave in ICU as she is requiring ventilator support  Best Practice (right click and "Reselect all SmartList Selections" daily)   Diet/type: tubefeeds DVT prophylaxis: heparin subQ GI prophylaxis: PPI Lines: PICC Foley:  N/A Code  Status:  full code Last date of multidisciplinary goals of care discussion [pending]   The patient is critically ill with multiple organ systems failure and requires high complexity decision making for assessment and support, frequent evaluation and titration of therapies, application of advanced monitoring technologies and extensive interpretation of multiple databases. Critical Care Time devoted to patient care services described in this note independent of APP/resident time (if applicable)  is 30 minutes.   Sherrilyn Rist MD [facility] Personal pager: See Amion If unanswered, please page CCM On-call: [phone]

## 2021-10-20 ENCOUNTER — Inpatient Hospital Stay (HOSPITAL_COMMUNITY): Payer: Medicare Other

## 2021-10-20 DIAGNOSIS — D649 Anemia, unspecified: Secondary | ICD-10-CM | POA: Diagnosis not present

## 2021-10-20 DIAGNOSIS — J9601 Acute respiratory failure with hypoxia: Secondary | ICD-10-CM | POA: Diagnosis not present

## 2021-10-20 DIAGNOSIS — J384 Edema of larynx: Secondary | ICD-10-CM | POA: Diagnosis not present

## 2021-10-20 DIAGNOSIS — J383 Other diseases of vocal cords: Secondary | ICD-10-CM | POA: Diagnosis not present

## 2021-10-20 DIAGNOSIS — G9340 Encephalopathy, unspecified: Secondary | ICD-10-CM | POA: Diagnosis not present

## 2021-10-20 LAB — HEPATIC FUNCTION PANEL
ALT: 23 U/L (ref 0–44)
AST: 13 U/L — ABNORMAL LOW (ref 15–41)
Albumin: 1.9 g/dL — ABNORMAL LOW (ref 3.5–5.0)
Alkaline Phosphatase: 125 U/L (ref 38–126)
Bilirubin, Direct: <0.1 mg/dL (ref 0.0–0.2)
Total Bilirubin: 0.7 mg/dL (ref 0.3–1.2)
Total Protein: 6.7 g/dL (ref 6.5–8.1)

## 2021-10-20 LAB — GLUCOSE, CAPILLARY
Glucose-Capillary: 267 mg/dL — ABNORMAL HIGH (ref 70–99)
Glucose-Capillary: 187 mg/dL — ABNORMAL HIGH (ref 70–99)
Glucose-Capillary: 187 mg/dL — ABNORMAL HIGH (ref 70–99)
Glucose-Capillary: 174 mg/dL — ABNORMAL HIGH (ref 70–99)
Glucose-Capillary: 171 mg/dL — ABNORMAL HIGH (ref 70–99)
Glucose-Capillary: 147 mg/dL — ABNORMAL HIGH (ref 70–99)
Glucose-Capillary: 194 mg/dL — ABNORMAL HIGH (ref 70–99)

## 2021-10-20 LAB — CBC WITH DIFFERENTIAL/PLATELET
Abs Immature Granulocytes: 0.52 10*3/uL — ABNORMAL HIGH (ref 0.00–0.07)
Basophils Absolute: 0 10*3/uL (ref 0.0–0.1)
Basophils Relative: 0 %
Eosinophils Absolute: 0.2 10*3/uL (ref 0.0–0.5)
Eosinophils Relative: 1 %
HCT: 27.3 % — ABNORMAL LOW (ref 36.0–46.0)
Hemoglobin: 7.9 g/dL — ABNORMAL LOW (ref 12.0–15.0)
Immature Granulocytes: 4 %
Lymphocytes Relative: 13 %
Lymphs Abs: 1.6 10*3/uL (ref 0.7–4.0)
MCH: 28.5 pg (ref 26.0–34.0)
MCHC: 28.9 g/dL — ABNORMAL LOW (ref 30.0–36.0)
MCV: 98.6 fL (ref 80.0–100.0)
Monocytes Absolute: 0.8 10*3/uL (ref 0.1–1.0)
Monocytes Relative: 7 %
Neutro Abs: 8.9 10*3/uL — ABNORMAL HIGH (ref 1.7–7.7)
Neutrophils Relative %: 75 %
Platelets: 330 10*3/uL (ref 150–400)
RBC: 2.77 MIL/uL — ABNORMAL LOW (ref 3.87–5.11)
RDW: 14.7 % (ref 11.5–15.5)
WBC: 12 10*3/uL — ABNORMAL HIGH (ref 4.0–10.5)
nRBC: 0.3 % — ABNORMAL HIGH (ref 0.0–0.2)

## 2021-10-20 LAB — BASIC METABOLIC PANEL
Anion gap: 8 (ref 5–15)
BUN: 36 mg/dL — ABNORMAL HIGH (ref 6–20)
CO2: 28 mmol/L (ref 22–32)
Calcium: 10.1 mg/dL (ref 8.9–10.3)
Chloride: 109 mmol/L (ref 98–111)
Creatinine, Ser: 1.79 mg/dL — ABNORMAL HIGH (ref 0.44–1.00)
GFR, Estimated: 33 mL/min — ABNORMAL LOW (ref >60)
Glucose, Bld: 197 mg/dL — ABNORMAL HIGH (ref 70–99)
Potassium: 4.6 mmol/L (ref 3.5–5.1)
Sodium: 145 mmol/L (ref 135–145)

## 2021-10-20 LAB — PHOSPHORUS: Phosphorus: 2.3 mg/dL — ABNORMAL LOW (ref 2.5–4.6)

## 2021-10-20 LAB — LIPASE, BLOOD: Lipase: 47 U/L (ref 11–51)

## 2021-10-20 LAB — MAGNESIUM
Magnesium: 1.6 mg/dL — ABNORMAL LOW (ref 1.7–2.4)
Magnesium: 2.5 mg/dL — ABNORMAL HIGH (ref 1.7–2.4)

## 2021-10-20 MED ORDER — CLONAZEPAM 0.5 MG PO TABS: 0.5000 mg | ORAL_TABLET | Freq: Three times a day (TID) | ORAL | Status: DC | PRN

## 2021-10-20 MED ORDER — DOCUSATE SODIUM 100 MG PO CAPS: 100.0000 mg | ORAL_CAPSULE | Freq: Two times a day (BID) | ORAL | Status: DC | PRN

## 2021-10-20 MED ORDER — PROCHLORPERAZINE EDISYLATE 10 MG/2ML IJ SOLN
5.0000 mg | Freq: Four times a day (QID) | INTRAMUSCULAR | Status: AC | PRN
  Administered 2021-10-20 – 2021-10-21 (×2): 5 mg via INTRAVENOUS
  Filled 2021-10-20 (×3): qty 1

## 2021-10-20 MED ORDER — MONTELUKAST SODIUM 10 MG PO TABS
10.0000 mg | ORAL_TABLET | Freq: Every day | ORAL | Status: DC
  Administered 2021-10-20 – 2021-11-14 (×26): 10 mg via ORAL
  Filled 2021-10-20 (×27): qty 1

## 2021-10-20 MED ORDER — PANTOPRAZOLE 2 MG/ML SUSPENSION
40.0000 mg | Freq: Two times a day (BID) | ORAL | Status: DC
Start: 2021-10-20 — End: 2021-10-20

## 2021-10-20 MED ORDER — HYDRALAZINE HCL 50 MG PO TABS
75.0000 mg | ORAL_TABLET | Freq: Two times a day (BID) | ORAL | Status: DC
  Administered 2021-10-20 – 2021-10-21 (×3): 75 mg
  Filled 2021-10-20 (×3): qty 1

## 2021-10-20 MED ORDER — QUETIAPINE FUMARATE 100 MG PO TABS
100.0000 mg | ORAL_TABLET | Freq: Two times a day (BID) | ORAL | Status: DC
  Administered 2021-10-20 – 2021-10-21 (×3): 100 mg via ORAL
  Filled 2021-10-20 (×3): qty 1

## 2021-10-20 MED ORDER — MAGNESIUM SULFATE 4 GM/100ML IV SOLN
4.0000 g | Freq: Once | INTRAVENOUS | Status: AC
  Administered 2021-10-20: 4 g via INTRAVENOUS
  Filled 2021-10-20: qty 100

## 2021-10-20 MED ORDER — ASPIRIN 81 MG PO CHEW
81.0000 mg | CHEWABLE_TABLET | Freq: Every day | ORAL | Status: DC
  Administered 2021-10-20 – 2021-11-15 (×27): 81 mg via ORAL
  Filled 2021-10-20 (×27): qty 1

## 2021-10-20 MED ORDER — METOCLOPRAMIDE HCL 5 MG/ML IJ SOLN
5.0000 mg | Freq: Three times a day (TID) | INTRAMUSCULAR | Status: DC
  Administered 2021-10-20 – 2021-10-21 (×3): 5 mg via INTRAVENOUS
  Filled 2021-10-20 (×3): qty 2

## 2021-10-20 MED ORDER — GUAIFENESIN 100 MG/5ML PO LIQD
5.0000 mL | ORAL | Status: DC | PRN
  Administered 2021-10-24 – 2021-11-12 (×4): 5 mL via ORAL
  Filled 2021-10-20 (×2): qty 5
  Filled 2021-10-20: qty 10
  Filled 2021-10-20: qty 5

## 2021-10-20 MED ORDER — CARVEDILOL 6.25 MG PO TABS
6.2500 mg | ORAL_TABLET | Freq: Two times a day (BID) | ORAL | Status: DC
  Administered 2021-10-20 – 2021-10-23 (×6): 6.25 mg via ORAL
  Filled 2021-10-20 (×6): qty 1

## 2021-10-20 MED ORDER — SODIUM PHOSPHATES 45 MMOLE/15ML IV SOLN
15.0000 mmol | Freq: Once | INTRAVENOUS | Status: AC
  Administered 2021-10-20: 15 mmol via INTRAVENOUS
  Filled 2021-10-20: qty 5

## 2021-10-20 MED ORDER — MAGNESIUM SULFATE 4 GM/100ML IV SOLN
4.0000 g | Freq: Once | INTRAVENOUS | Status: DC
  Filled 2021-10-20: qty 100

## 2021-10-20 MED ORDER — MAGNESIUM SULFATE 2 GM/50ML IV SOLN: 2.0000 g | Freq: Once | INTRAVENOUS | Status: DC

## 2021-10-20 MED ORDER — PANTOPRAZOLE SODIUM 40 MG PO TBEC
40.0000 mg | DELAYED_RELEASE_TABLET | Freq: Two times a day (BID) | ORAL | Status: DC
  Administered 2021-10-20 – 2021-11-15 (×53): 40 mg via ORAL
  Filled 2021-10-20 (×53): qty 1

## 2021-10-20 MED ORDER — FLUOXETINE HCL 20 MG PO CAPS
20.0000 mg | ORAL_CAPSULE | Freq: Every day | ORAL | Status: DC
  Administered 2021-10-20 – 2021-10-21 (×2): 20 mg via ORAL
  Filled 2021-10-20 (×3): qty 1

## 2021-10-20 MED ORDER — CLONAZEPAM 1 MG PO TABS
2.0000 mg | ORAL_TABLET | Freq: Two times a day (BID) | ORAL | Status: DC
  Administered 2021-10-20 – 2021-10-21 (×3): 2 mg via ORAL
  Filled 2021-10-20 (×3): qty 2

## 2021-10-20 MED ORDER — POLYETHYLENE GLYCOL 3350 17 G PO PACK: 17.0000 g | PACK | Freq: Every day | ORAL | Status: DC | PRN

## 2021-10-20 MED ORDER — INSULIN DETEMIR 100 UNIT/ML ~~LOC~~ SOLN
20.0000 [IU] | Freq: Two times a day (BID) | SUBCUTANEOUS | Status: DC
Start: 2021-10-21 — End: 2021-10-23
  Administered 2021-10-21 – 2021-10-22 (×4): 20 [IU] via SUBCUTANEOUS
  Filled 2021-10-20 (×6): qty 0.2

## 2021-10-20 NOTE — Progress Notes (Signed)
[place] Progress Note Patient Name: Maureen Jones DOB: 1966-03-01 MRN: [id number]   Date of Service  10/20/2021  HPI/Events of Note  Patient pulled out her CorTrack feeding tube earlier today and is no longer getting continuous enteral nutrition.  eICU Interventions  Lantus dose reduced to 20 units Q HS, Humalog regimen adjusted as well.        [name] Pallavi Clifton 10/20/2021, 9:53 PM

## 2021-10-20 NOTE — Progress Notes (Signed)
OT Cancellation Note  Patient Details Name: Maureen Jones MRN: [id number] DOB: 1966-09-21   Cancelled Treatment:    Reason Eval/Treat Not Completed: Other (comment)- RN reports pt vomiting all night, declining transition off vent this morning.  OT will hold today and follow up tomorrow as able.    Jolaine Artist, OT Acute Rehabilitation Services Pager [phone] Office [phone]   Maureen Jones 10/20/2021, 11:42 AM

## 2021-10-20 NOTE — Progress Notes (Signed)
Speech Language Pathology Treatment: Dysphagia;Passy Muir Speaking valve  Patient Details Name: Maureen Jones MRN: [id number] DOB: 1965-12-23 Today's Date: 10/20/2021 Time: 3474-2595 SLP Time Calculation (min) (ACUTE ONLY): 18 min  Assessment / Plan / Recommendation Clinical Impression  Pt was seen for treatment. Pt's nurse reported that the pt has been tolerating the current diet without overt s/sx of aspiration, but that she has not wanted to eat much today since she had multiple episodes of emesis last night. Pt tolerated nectar thick liquids and ice chips without overt s/sx of aspiration, but refused solids for fear of vomiting. Reduced air trapping was noted with intermittent PMSV removal compared to when she was last seen by this SLP on 11/7. She tolerated PMSV for 1 minute. Pt was able to produce " I feel like I went backwards" with PMSV donned. Respiratory support was reduced to one word per breath and vocal quality was strained with reduced vocal intensity. PMSV was removed after 1 minute due increased WOB, and elevated RR to 41. Mild air trapping was noted with removal. SLP suspects that trach downsizing will be necessary to facilitate improved PMSV tolerance. Pt's current diet of regular texture solids and nectar thick liquids will be continued and PMSV will still only be used with SLP. SLP will continue to follow pt.     HPI HPI: Pt is a 55 y/o female admitted with AMS 10/10. Stroke w/u negative, dx with acute metabolic encephalopathy in the setting of hypertensive emergency. ETT 10/10-10/12; reintubated 10/12 d/t increased WOB, not mobilizing secretions, stridor, received steroids, extubated 10/14 with lingering stridor, which was improving 10/15. ENT scoped pt 10/18: "Edema of the glottis and subglottic larynx following recent intubation and then repeat intubation.  There is no evidence of cord paralysis or any tumor present.  There is currently no stridor.  Allow more time for resolution  of the swelling.  Unfortunately she is at risk for developing subglottic stenosis." SLP followed pt 10/15-10/24. MBS 10/19: trace posterior spillage, mild pharyngeal delay, trace transient penetration; regular texture diet with thin liquids recommended and SLP ultimately signed off with pt demonstrating adequate tolerance. 10/25: Stridor again noted; pt intubated and trach placed.  MBS 10/27: oropharyngeal dysphagia characterized by reduced bolus cohesion, reduced hyolaryngeal elevation, reduced anterior laryngeal movement, a pharyngeal delay, penetration (PAS 3, 5) with thin liquids. A dyspahgia 3 diet with nectar thick liquids was initiated and she was subsequently advanced to regular and thin liquids on 10/30. Pt with respiratory distress from disloged trach on 11/2 leading to PEA arrest with CPR x 10 min. ETT via stoma. Trach revised 11/2; #6XLT distal.  Cortrak placed 11/4. Trach changed from 6XL distal to 6XL proximal 11/5.      SLP Plan  Continue with current plan of care      Recommendations for follow up therapy are one component of a multi-disciplinary discharge planning process, led by the attending physician.  Recommendations may be updated based on patient status, additional functional criteria and insurance authorization.    Recommendations  Diet recommendations: Regular;Nectar-thick liquid Liquids provided via: Cup;No straw Medication Administration: Via alternative means Supervision: Patient able to self feed Compensations: Slow rate;Small sips/bites Postural Changes and/or Swallow Maneuvers: Seated upright 90 degrees      Patient may use Passy-Muir Speech Valve: with SLP only PMSV Supervision: Full MD: Please consider changing trach tube to : Smaller size;Cuffless         Oral Care Recommendations: Oral care QID Follow Up Recommendations: Home health SLP Assistance  recommended at discharge: PRN SLP Visit Diagnosis: Dysphagia, pharyngeal phase (R13.13) Plan: Continue  with current plan of care       Keyondra Lagrand I. Hardin Negus, [place], [place] Office number [phone] Pager [place]  10/20/2021, 2:40 PM

## 2021-10-20 NOTE — Progress Notes (Signed)
NAMEJonnae Jones, MRN:  [id number], DOB:  [birth year], LOS: 29 ADMISSION DATE:  09/20/2021, CONSULTATION DATE: 10/10 REFERRING MD:  Dr. Laverta [place], CHIEF COMPLAINT:  AMS   History of Present Illness:  55 y/o F who presented to [facility] on 10/10 with reports of acute altered mental status secondary to hypertensive emergency with hypoxic respiratory failure related to VCD/post intubation subglottic and glottic edema/vocal hoarseness  +/- asthmatic exacerbation.  Ultimately required trach placement.   Transferred back to ICU on 11/2 after trach became dislodged on floor, resulting in PEA arrest with 10 min CPR and 2mg  epi. ROSC obtained. Trach revised to 6.0 XLT.  She remains critically ill.  Pertinent  Medical History  HTN DM   Significant Hospital Events: Including procedures, antibiotic start and stop dates in addition to other pertinent events   10/10 Admit with acute encephalopathy, hypertensive 10/12 extubated, increased WOB, re-intubated 10/14 extubated , lingering stridor, Received racemic epi x2 over the last 24 hours + another dose of Decadron, Mild agitation requiring restart of Precedex 10/15 swallow evaluation >> dysphagia 3 diet 10/20 decompensated after switch to PO steroids 10/21 better after CAT and back on IV steroids 10/22 agitated back on  BIPAP 10/25 stridor again. Marked work of breathing. Intubated, airway noises, stridor and wheezing completely resolved post intubation. Spoke to ENT who will follow after trach. Trach placed by Dr Vaughan Browner. Seroquel increased to 100 qhs, changes solumedrol to prednisone to begin taper, rested on propofol over night. Tubefeeds started. RUE PICC placed  10/26 prop weaning off attempting ATC  11/2 PEA arrest in setting of dislodged tracheostomy. questionable syncope.  Total 10 minutes CPR. Transferred to intensive care unit intubated via trach stoma for redo trach 11/2/202.  Redo tracheostomy. 6 XLT. 11/4 Concern overnight for STEMI, hep  gtt started, cards consulted; this was artifact 11/5 changed out distal XLT for proximal due to positioning within trachea 11/8 tolerated trach collar for about 6 hours yesterday 11/7, placed back on vent   Interim History / Subjective:  Overnight with nausea/vomiting  Objective   Blood pressure (!) 148/72, pulse 99, temperature 99.7 F (37.6 C), temperature source Oral, resp. rate 13, height 5\' 6"  (1.676 m), weight 99.1 kg, SpO2 98 %.    Vent Mode: PSV;CPAP FiO2 (%):  [40 %] 40 % Set Rate:  [20 bmp] 20 bmp Vt Set:  [560 mL] 560 mL PEEP:  [5 cmH20] 5 cmH20 Pressure Support:  [10 cmH20] 10 cmH20 Plateau Pressure:  [20 cmH20] 20 cmH20   Intake/Output Summary (Last 24 hours) at 10/20/2021 1017 Last data filed at 10/20/2021 0230 Gross per 24 hour  Intake 687.47 ml  Output 785 ml  Net -97.53 ml   Filed Weights   10/17/21 0500 10/18/21 0407 10/19/21 0327  Weight: 102.4 kg 100.9 kg 99.1 kg    Examination: Adult female, lying in bed, no distress  Alert, follows commands, pupils intact and reactive  Coarse, scattered rhonchi to upper, remains on vent Trach in place >> clean,dry,intact RRR, HR 95  -edema   Labs reviewed Anemia, elevated BUN/creatinine > improving to prior  Hypomagnesemia > replaced this AM   Resolved Hospital Problem list   RML/RLL PNA (treated) Hypertensive emergency   Assessment & Plan:   Acute hypoxemic respiratory failure with history of vocal cord dysfunction Subglottic and glottic edema -S/p tracheostomy -Tracheostomy dislodgment leading to PEA arrest on 11/2-CPR for 10 minutes Plan -Continue ventilator support as needed, with HS support, TC as tolerated  -Routine  [place] will continue to follow for vent weaning and tracheostomy   Best Practice (right click and "Reselect all SmartList Selections" daily)   Diet/type: tubefeeds DVT prophylaxis: heparin subQ GI prophylaxis: PPI Lines: PICC Foley:  N/A Code Status:  full code Last  date of multidisciplinary goals of care discussion [pending]  Maureen Jones, AGACNP-BC [place] Pgr: [phone]

## 2021-10-20 NOTE — Progress Notes (Signed)
[facility] ADULT ICU REPLACEMENT PROTOCOL   The patient does apply for the [facility] Adult ICU Electrolyte Replacment Protocol based on the criteria listed below:   1.Exclusion criteria: TCTS patients, ECMO patients, and Dialysis patients 2. Is GFR >/= 30 ml/min? Yes.    Patient's GFR today is 33 3. Is SCr </= 2? Yes.   Patient's SCr is 1.79 mg/dL 4. Did SCr increase >/= 0.5 in 24 hours? No. 5.Pt's weight >40kg  Yes.   6. Abnormal electrolyte(s): phos 2.3, mag 1.6 7. Electrolytes replaced per protocol 8.  Call MD STAT for K+ </= 2.5, Phos </= 1, or Mag </= 1 Physician:  n/a   Maureen Jones 10/20/2021 5:12 AM

## 2021-10-20 NOTE — Progress Notes (Signed)
Pt placed back on vent at 0259 with the same settings due to increased WOB.

## 2021-10-20 NOTE — Progress Notes (Signed)
[facility] Progress Note Patient Name: Sadiya Durand DOB: November 12, 1966 MRN: [id number]   Date of Service  10/20/2021  HPI/Events of Note  Patient with nausea and vomiting incompletely relieved by Zofran.  eICU Interventions  PRN Compazine ordered for nausea and vomiting not relieved by Zofran.        Hamish Banks U Geovanni Rahming 10/20/2021, 1:00 AM

## 2021-10-20 NOTE — Progress Notes (Signed)
Pt placed on 40% ATC and is tolerating well at this time. RN made aware.

## 2021-10-20 NOTE — Progress Notes (Signed)
[facility] PROGRESS NOTE   Maureen Jones TIW:[id number] DOB: 01/03/1966 DOA: 09/20/2021  PCP: Pcp, No  Brief History/Interval Summary: 55 y/o F who presented to [facility] on 10/10 with reports of acute altered mental status secondary to hypertensive emergency with hypoxic respiratory failure related to VCD/post intubation subglottic and glottic edema/vocal hoarseness  +/- asthmatic exacerbation.  Ultimately required trach placement. Transferred back to ICU on 11/2 after trach became dislodged on floor, resulting in PEA arrest with 10 min CPR and $Remov'2mg'UXRpNf$  epi. ROSC obtained. Trach revised to 6.0 XLT.   Reason for Visit: Acute respiratory failure with hypoxia  Consultants: Critical care medicine.  Significant Hospital Events: Including procedures, antibiotic start and stop dates in addition to other pertinent events   10/10 Admit with acute encephalopathy, hypertensive 10/12 extubated, increased WOB, re-intubated 10/14 extubated , lingering stridor, Received racemic epi x2 over the last 24 hours + another dose of Decadron, Mild agitation requiring restart of Precedex 10/15 swallow evaluation >> dysphagia 3 diet 10/20 decompensated after switch to PO steroids 10/21 better after CAT and back on IV steroids 10/22 agitated back on  BIPAP 10/25 stridor again. Marked work of breathing. Intubated, airway noises, stridor and wheezing completely resolved post intubation. Spoke to ENT who will follow after trach. Trach placed by Dr Vaughan Browner. Seroquel increased to 100 qhs, changes solumedrol to prednisone to begin taper, rested on propofol over night. Tubefeeds started. RUE PICC placed  10/26 prop weaning off attempting ATC  11/2 PEA arrest in setting of dislodged tracheostomy. questionable syncope.  Total 10 minutes CPR. Transferred to intensive care unit intubated via trach stoma for redo trach 11/2/202.  Redo tracheostomy. 6 XLT. 11/4 Concern overnight for STEMI, hep gtt started, cards consulted;  this was artifact 11/5 changed out distal XLT for proximal due to positioning within trachea 11/8 tolerated trach collar for about 6 hours 11/7, placed back on vent      Subjective/Interval History: Patient on ventilator.  Overnight events noted.  Patient complains of nausea and upper abdominal discomfort.  She had episodes of emesis yesterday.    Assessment/Plan:  Nausea and vomiting Mildly tender in the epigastric area.  But abdomen is benign for the most part.  She is noted to be on PPI.  Not noted to be on NSAIDs. Will check LFTs and lipase level.  Treat symptomatically for now.  If symptoms recur then may have to do imaging studies.  Acute respiratory failure with hypoxia with history of vocal cord dysfunction/subglottic and glottic edema She is status post tracheostomy.  She had dislodgment of his tracheostomy on 11/2 leading to PEA arrest.  She required CPR for 10 minutes. Remains on ventilator on and off.  Critical care medicine is following.  Will remain in ICU to she required ventilator.  Long-term plan regarding this is not clear at this time.  Healthcare associated pneumonia Completed 7 days of antibiotics.  Stable.  Essential hypertension Noted to be on hydralazine, nitrates, carvedilol.  Blood pressure remains poorly controlled.  We will increase the dose of the hydralazine.  Diabetes mellitus type 2, uncontrolled with hyperglycemia Monitor CBGs.  Noted to be on Levemir and SSI.  HbA1c 12.7  Normocytic anemia Likely anemia of critical illness.  No evidence of overt bleeding.  Check anemia panel if not done recently.  Hypomagnesemia and hypophosphatemia Pharmacy is addressing.  Dysphagia Secondary to tracheostomy.  On tube feedings.  Obesity Estimated body mass index is 35.26 kg/m as calculated from the following:   Height as  of this encounter: $RemoveBeforeD'5\' 6"'MLDigayKktXkYp$  (1.676 m).   Weight as of this encounter: 99.1 kg.    DVT Prophylaxis: Subcutaneous heparin Code Status:  Partial code Family Communication: No family at bedside Disposition Plan: To be determined  Status is: Inpatient  Remains inpatient appropriate because: Acute respiratory failure requiring mechanical ventilation on and off      Medications: Scheduled:  arformoterol  15 mcg Nebulization BID   aspirin  81 mg Per Tube Daily   budesonide (PULMICORT) nebulizer solution  0.25 mg Nebulization BID   carvedilol  6.25 mg Per Tube BID   chlorhexidine gluconate (MEDLINE KIT)  15 mL Mouth Rinse BID   Chlorhexidine Gluconate Cloth  6 each Topical Daily   clonazePAM  2 mg Per Tube BID   feeding supplement (PROSource TF)  45 mL Per Tube TID   FLUoxetine  20 mg Per Tube Daily   fluticasone  2 spray Each Nare Daily   free water  300 mL Per Tube Q6H   heparin injection (subcutaneous)  5,000 Units Subcutaneous Q8H   hydrALAZINE  50 mg Per Tube BID   insulin aspart  0-20 Units Subcutaneous Q4H   insulin aspart  5 Units Subcutaneous Q4H   insulin detemir  30 Units Subcutaneous BID   isosorbide dinitrate  10 mg Per Tube BID   lidocaine  2 patch Transdermal Q24H   loratadine  10 mg Per Tube Daily   mouth rinse  15 mL Mouth Rinse 10 times per day   montelukast  10 mg Per Tube QHS   nystatin  5 mL Oral QID   pantoprazole sodium  40 mg Per Tube Daily   QUEtiapine  100 mg Per Tube BID   sodium chloride flush  10-40 mL Intracatheter Q12H   Continuous:  sodium chloride 10 mL/hr at 10/20/21 0200   feeding supplement (VITAL 1.5 CAL) Stopped (10/19/21 2325)   sodium phosphate  Dextrose 5% IVPB 15 mmol (10/20/21 0629)   PZW:CHENID chloride, acetaminophen, acetaminophen, albuterol, clonazePAM, dextrose, docusate, guaiFENesin, hydrALAZINE, nitroGLYCERIN, ondansetron (ZOFRAN) IV, phenol, polyethylene glycol, prochlorperazine  Antibiotics: Anti-infectives (From admission, onward)    Start     Dose/Rate Route Frequency Ordered Stop   10/18/21 0830  ceFEPIme (MAXIPIME) 2 g in sodium chloride 0.9 % 100 mL  IVPB  Status:  Discontinued        2 g 200 mL/hr over 30 Minutes Intravenous Every 24 hours 10/17/21 1055 10/17/21 1056   10/17/21 2200  ceFEPIme (MAXIPIME) 2 g in sodium chloride 0.9 % 100 mL IVPB        2 g 200 mL/hr over 30 Minutes Intravenous Every 12 hours 10/17/21 1056 10/18/21 2145   10/14/21 0830  ceFEPIme (MAXIPIME) 2 g in sodium chloride 0.9 % 100 mL IVPB  Status:  Discontinued        2 g 200 mL/hr over 30 Minutes Intravenous Every 24 hours 10/14/21 0721 10/17/21 1055   10/14/21 0215  piperacillin-tazobactam (ZOSYN) IVPB 3.375 g        3.375 g 100 mL/hr over 30 Minutes Intravenous  Once 10/14/21 0126 10/14/21 0302   10/04/21 1100  vancomycin (VANCOREADY) IVPB 1250 mg/250 mL  Status:  Discontinued        1,250 mg 166.7 mL/hr over 90 Minutes Intravenous Every 48 hours 10/02/21 0939 10/03/21 1508   10/02/21 2200  ceFEPIme (MAXIPIME) 2 g in sodium chloride 0.9 % 100 mL IVPB  Status:  Discontinued        2 g 200  mL/hr over 30 Minutes Intravenous Every 12 hours 10/02/21 0948 10/04/21 1010   10/02/21 1030  vancomycin (VANCOREADY) IVPB 2000 mg/400 mL        2,000 mg 200 mL/hr over 120 Minutes Intravenous  Once 10/02/21 0939 10/02/21 1324   09/27/21 0900  ceFEPIme (MAXIPIME) 2 g in sodium chloride 0.9 % 100 mL IVPB  Status:  Discontinued        2 g 200 mL/hr over 30 Minutes Intravenous Every 12 hours 09/27/21 0800 10/02/21 0948   09/27/21 0900  vancomycin (VANCOREADY) IVPB 1500 mg/300 mL  Status:  Discontinued        1,500 mg 150 mL/hr over 120 Minutes Intravenous Every 48 hours 09/27/21 0800 09/28/21 1058   09/21/21 0800  cefTRIAXone (ROCEPHIN) 2 g in sodium chloride 0.9 % 100 mL IVPB        2 g 200 mL/hr over 30 Minutes Intravenous Every 24 hours 09/21/21 0742 09/25/21 0808   09/20/21 0945  cefTRIAXone (ROCEPHIN) 2 g in sodium chloride 0.9 % 100 mL IVPB  Status:  Discontinued        2 g 200 mL/hr over 30 Minutes Intravenous Every 24 hours 09/20/21 0936 09/21/21 0742        Objective:  Vital Signs  Vitals:   10/20/21 0729 10/20/21 0800 10/20/21 0821 10/20/21 0831  BP:  (!) 148/72    Pulse:  92  99  Resp:  20  13  Temp: 99.7 F (37.6 C)     TempSrc: Oral     SpO2:  98% 95% 98%  Weight:      Height:        Intake/Output Summary (Last 24 hours) at 10/20/2021 1022 Last data filed at 10/20/2021 0230 Gross per 24 hour  Intake 687.47 ml  Output 785 ml  Net -97.53 ml   Filed Weights   10/17/21 0500 10/18/21 0407 10/19/21 0327  Weight: 102.4 kg 100.9 kg 99.1 kg    General appearance: Awake alert.  In no distress Tracheostomy is noted. Resp: Coarse breath sounds bilaterally.  Few crackles at the bases.  No wheezing or rhonchi Cardio: S1-S2 is normal regular.  No S3-S4.  No rubs murmurs or bruit GI: Abdomen is soft.  Mildly tender in the epigastric area without any rebound LTT or guarding.  No masses organomegaly.  Bowel sounds present.   Extremities: No edema.  Moving all 4 extremities Neurologic:  No focal neurological deficits.    Lab Results:  Data Reviewed: I have personally reviewed following labs and imaging studies  CBC: Recent Labs  Lab 10/16/21 0408 10/17/21 0359 10/18/21 0405 10/19/21 0319 10/20/21 0411  WBC 11.5* 9.8 11.3* 10.2 12.0*  NEUTROABS  --   --   --   --  8.9*  HGB 7.2* 7.2* 7.3* 7.0* 7.9*  HCT 24.6* 24.9* 25.2* 24.4* 27.3*  MCV 96.1 97.3 98.8 100.0 98.6  PLT 239 251 283 279 916    Basic Metabolic Panel: Recent Labs  Lab 10/14/21 1707 10/15/21 0339 10/15/21 1700 10/16/21 0408 10/17/21 0359 10/18/21 0405 10/19/21 0319 10/20/21 0411  NA  --  137  --  140 141 144 147* 145  K  --  4.6  --  4.4 4.7 4.7 4.7 4.6  CL  --  103  --  106 109 111 113* 109  CO2  --  24  --  $R'26 24 27 27 28  'hz$ GLUCOSE  --  191*  --  162* 298* 247* 189* 197*  BUN  --  55*  --  50* 47* 42* 41* 36*  CREATININE  --  2.59*  --  2.44* 2.15* 2.00* 1.99* 1.79*  CALCIUM  --  9.3  --  9.3 9.5 9.7 9.9 10.1  MG 1.9 2.1 2.1 2.1 2.1  --   --   1.6*  PHOS 4.7* 4.2 3.9 3.4  --   --   --  2.3*    GFR: Estimated Creatinine Clearance: 42.7 mL/min (A) (by C-G formula based on SCr of 1.79 mg/dL (H)).  Liver Function Tests: No results for input(s): AST, ALT, ALKPHOS, BILITOT, PROT, ALBUMIN in the last 168 hours.  No results for input(s): LIPASE, AMYLASE in the last 168 hours. No results for input(s): AMMONIA in the last 168 hours.    CBG: Recent Labs  Lab 10/19/21 1547 10/19/21 1951 10/19/21 2356 10/20/21 0406 10/20/21 0727  GLUCAP 237* 265* 174* 187* 187*     Recent Results (from the past 240 hour(s))  Expectorated Sputum Assessment w Gram Stain, Rflx to Resp Cult     Status: None   Collection Time: 10/12/21  1:59 PM   Specimen: Expectorated Sputum  Result Value Ref Range Status   Specimen Description EXPECTORATED SPUTUM  Final   Special Requests NONE  Final   Sputum evaluation   Final    THIS SPECIMEN IS ACCEPTABLE FOR SPUTUM CULTURE Performed at [facility], [place] [address], [place], [place] [address]    Report Status 10/12/2021 FINAL  Final  Culture, Respiratory w Gram Stain     Status: None   Collection Time: 10/12/21  1:59 PM  Result Value Ref Range Status   Specimen Description EXPECTORATED SPUTUM  Final   Special Requests NONE Reflexed from [phone]  Final   Gram Stain   Final    FEW WBC PRESENT, PREDOMINANTLY PMN FEW GRAM NEGATIVE RODS FEW GRAM POSITIVE COCCI IN PAIRS AND CHAINS    Culture   Final    Normal respiratory flora-no Staph aureus or Pseudomonas seen Performed at [facility], [address] [place], [place] [address]    Report Status 10/15/2021 FINAL  Final  Culture, Respiratory w Gram Stain     Status: None   Collection Time: 10/14/21  9:09 AM   Specimen: Tracheal Aspirate; Respiratory  Result Value Ref Range Status   Specimen Description TRACHEAL ASPIRATE  Final   Special Requests NONE  Final   Gram Stain   Final    MODERATE WBC PRESENT,BOTH PMN AND MONONUCLEAR FEW GRAM  POSITIVE COCCI IN PAIRS RARE GRAM NEGATIVE RODS    Culture   Final    FEW Normal respiratory flora-no Staph aureus or Pseudomonas seen Performed at [facility], [address] [place], [place] [address]    Report Status 10/16/2021 FINAL  Final  MRSA Next Gen by PCR, Nasal     Status: None   Collection Time: 10/14/21 10:48 AM   Specimen: Nasal Mucosa; Nasal Swab  Result Value Ref Range Status   MRSA by PCR Next Gen NOT DETECTED NOT DETECTED Final    Comment: (NOTE) The GeneXpert MRSA Assay (FDA approved for NASAL specimens only), is one component of a comprehensive MRSA colonization surveillance program. It is not intended to diagnose MRSA infection nor to guide or monitor treatment for MRSA infections. Test performance is not FDA approved in patients less than 65 years old. Performed at [facility], [place] [address] [place], [place] [address]   Culture, Respiratory w Gram  Stain     Status: None   Collection Time: 10/16/21 12:33 PM   Specimen: Tracheal Aspirate; Respiratory  Result Value Ref Range Status   Specimen Description TRACHEAL ASPIRATE  Final   Special Requests NONE  Final   Gram Stain   Final    FEW SQUAMOUS EPITHELIAL CELLS PRESENT MODERATE WBC SEEN FEW GRAM POSITIVE COCCI    Culture   Final    RARE Normal respiratory flora-no Staph aureus or Pseudomonas seen Performed at [facility], [address], [place], [place] [address]    Report Status 10/18/2021 FINAL  Final      Radiology Studies: DG Swallowing Func-Speech Pathology  Result Date: 10/19/2021 Table formatting from the original result was not included. Objective Swallowing Evaluation: Type of Study: MBS-Modified Barium Swallow Study  Patient Details Name: Kallyn Demarcus MRN: [id number] Date of Birth: [birth year] Today's Date: 10/19/2021 Time: SLP Start Time (ACUTE ONLY): 1502 -SLP Stop Time (ACUTE ONLY): 1523 SLP Time Calculation (min) (ACUTE ONLY): 21 min Past Medical History: Past  Medical History: Diagnosis Date  Asthma   Diabetes ([place])   Glottic and subglottic edema 2022  Hypertension  Past Surgical History: No past surgical history on file. HPI: Pt is a 55 y/o female admitted with AMS 10/10. Stroke w/u negative, dx with acute metabolic encephalopathy in the setting of hypertensive emergency. ETT 10/10-10/12; reintubated 10/12 d/t increased WOB, not mobilizing secretions, stridor, received steroids, extubated 10/14 with lingering stridor, which was improving 10/15. ENT scoped pt 10/18: "Edema of the glottis and subglottic larynx following recent intubation and then repeat intubation.  There is no evidence of cord paralysis or any tumor present.  There is currently no stridor.  Allow more time for resolution of the swelling.  Unfortunately she is at risk for developing subglottic stenosis." SLP followed pt 10/15-10/24. MBS 10/19: trace posterior spillage, mild pharyngeal delay, trace transient penetration; regular texture diet with thin liquids recommended and SLP ultimately signed off with pt demonstrating adequate tolerance. 10/25: Stridor again noted; pt intubated and trach placed.  MBS 10/27: oropharyngeal dysphagia characterized by reduced bolus cohesion, reduced hyolaryngeal elevation, reduced anterior laryngeal movement, a pharyngeal delay, penetration (PAS 3, 5) with thin liquids. A dyspahgia 3 diet with nectar thick liquids was initiated and she was subsequently advanced to regular and thin liquids on 10/30. Pt with respiratory distress from disloged trach on 11/2 leading to PEA arrest with CPR x 10 min. ETT via stoma. Trach revised 11/2; #6XLT distal.  Cortrak placed 11/4. Trach changed from 6XL distal to 6XL proximal 11/5.  Subjective: Pt seen in radiology. No family present  Recommendations for follow up therapy are one component of a multi-disciplinary discharge planning process, led by the attending physician.  Recommendations may be updated based on patient status, additional  functional criteria and insurance authorization. Assessment / Plan / Recommendation Clinical Impressions 10/19/2021 Clinical Impression Pt with trach collar was seen for an MBS. MBS completed without PMV d/t pt inability to tolerate valve this date. Overall, pt presents with mild pharyngeal dysphagia resulting in penetration above the level of the vocal folds which does not clear spontaneously (PAS 3) with small and large sips of thin liquid secondary to delayed swallow initation at the level of the vallecula resulting in impaired swallow timing. Pt additionally exhibited reduced anterior laryngeal mobility and laryngeal elevation however this did not appear to diminish airway protection. Across consistencies, pt exhibited no oral phase impairments. Pharyngeally, pt exhibited aforementioned penetration during swallow with small and  large sips of thin liquid however exhibited no impairments with nectar thick liquid or regular solids. SLP unable to test pt for stimulability of compensatory strategies (throat clear, breath holding) d/t current trach without PMV status. Recommend Regular/Nectar thick liquid diet, administering medications whole in puree. SLP will continue to follow pt for management of diet tolerance and upgraded PO trials as appropriate. SLP Visit Diagnosis Dysphagia, pharyngeal phase (R13.13) Attention and concentration deficit following -- Frontal lobe and executive function deficit following -- Impact on safety and function Mild aspiration risk   Treatment Recommendations 10/19/2021 Treatment Recommendations Therapy as outlined in treatment plan below   Prognosis 10/19/2021 Prognosis for Safe Diet Advancement Good Barriers to Reach Goals -- Barriers/Prognosis Comment -- Diet Recommendations 10/19/2021 SLP Diet Recommendations Regular solids;Nectar thick liquid Liquid Administration via Straw;Cup Medication Administration Whole meds with puree Compensations Slow rate;Small sips/bites Postural Changes  Remain semi-upright after after feeds/meals (Comment);Seated upright at 90 degrees   Other Recommendations 10/19/2021 Recommended Consults -- Oral Care Recommendations Oral care BID;Patient independent with oral care Other Recommendations Order thickener from pharmacy Follow Up Recommendations Other (comment) Assistance recommended at discharge PRN Functional Status Assessment Patient has had a recent decline in their functional status and demonstrates the ability to make significant improvements in function in a reasonable and predictable amount of time. Frequency and Duration  10/19/2021 Speech Therapy Frequency (ACUTE ONLY) min 2x/week Treatment Duration 2 weeks   Oral Phase 10/19/2021 Oral Phase WFL Oral - Pudding Teaspoon -- Oral - Pudding Cup -- Oral - Honey Teaspoon -- Oral - Honey Cup -- Oral - Nectar Teaspoon -- Oral - Nectar Cup WFL Oral - Nectar Straw WFL Oral - Thin Teaspoon -- Oral - Thin Cup WFL Oral - Thin Straw NT Oral - Puree NT Oral - Mech Soft NT Oral - Regular WFL Oral - Multi-Consistency -- Oral - Pill -- Oral Phase - Comment --  Pharyngeal Phase 10/19/2021 Pharyngeal Phase Impaired Pharyngeal- Pudding Teaspoon -- Pharyngeal -- Pharyngeal- Pudding Cup -- Pharyngeal -- Pharyngeal- Honey Teaspoon -- Pharyngeal -- Pharyngeal- Honey Cup -- Pharyngeal -- Pharyngeal- Nectar Teaspoon -- Pharyngeal -- Pharyngeal- Nectar Cup WFL;Reduced anterior laryngeal mobility;Reduced laryngeal elevation Pharyngeal -- Pharyngeal- Nectar Straw WFL;Reduced anterior laryngeal mobility;Reduced laryngeal elevation Pharyngeal -- Pharyngeal- Thin Teaspoon -- Pharyngeal -- Pharyngeal- Thin Cup Penetration/Aspiration during swallow;Delayed swallow initiation-vallecula;Reduced anterior laryngeal mobility;Reduced laryngeal elevation Pharyngeal Material enters airway, remains ABOVE vocal cords and not ejected out Pharyngeal- Thin Straw NT Pharyngeal -- Pharyngeal- Puree NT Pharyngeal -- Pharyngeal- Mechanical Soft -- Pharyngeal --  Pharyngeal- Regular WFL;Reduced anterior laryngeal mobility;Reduced laryngeal elevation Pharyngeal -- Pharyngeal- Multi-consistency -- Pharyngeal -- Pharyngeal- Pill NT Pharyngeal -- Pharyngeal Comment --  Cervical Esophageal Phase  10/07/2021 Cervical Esophageal Phase WFL Pudding Teaspoon -- Pudding Cup -- Honey Teaspoon -- Honey Cup -- Nectar Teaspoon -- Nectar Cup -- Nectar Straw -- Thin Teaspoon -- Thin Cup -- Thin Straw -- Puree -- Mechanical Soft -- Regular -- Multi-consistency -- Pill -- Cervical Esophageal Comment -- Houston Siren 10/19/2021, 3:56 PM                         LOS: 30 days   Breeann Reposa [employer] on [email or web address]  10/20/2021, 10:22 AM

## 2021-10-20 NOTE — Progress Notes (Signed)
Nutrition Follow-up  DOCUMENTATION CODES:   Obesity unspecified  INTERVENTION:   - Vital Cuisine Shake TID, each supplement provides 520 kcal and 22 grams of protein  - Encourage PO intake  NUTRITION DIAGNOSIS:   Inadequate oral intake related to inability to eat as evidenced by NPO status.  Progressing, pt now on carb modified diet with nectar-thick liquids  GOAL:   Patient will meet greater than or equal to 90% of their needs  Progressing  MONITOR:   PO intake, Supplement acceptance, Diet advancement, Vent status, Labs, Weight trends, I & O's  REASON FOR ASSESSMENT:   Ventilator, Consult Enteral/tube feeding initiation and management  ASSESSMENT:   Pt with PMH of DM, HTN, and smoking admitted with acute encephalopathy.  10/10 - admit, intubated 10/12 - extubated and re-intubated 10/14 - extubated  10/15 - dysphagia 3 diet s/p swallow eval 10/22 - returned to BiPAP 10/25 - re-intubated, trach 10/26 - Cortrak placed (gastric tip confirmed via x-ray) 11/02 - Code Blue due to malpositioning of trach, s/p re-do trach 11/03 - NG tube placed (tip in stomach) 11/04 - Cortrak placed (tip in stomach) 11/08 - MBS, diet advanced to carb modified with nectar-thick liquids, Cortrak dislodged d/t N/V and removed  Discussed pt with RN and during ICU rounds. Pt with N/V last night and Cortrak was dislodged. Cortrak now out. Per MD, do not replace Cortrak at this time since pt has a diet order. No meal completions charted since diet advanced on 11/08. MD has ordered IV reglan. Pt has been tolerating trach collar but has been requiring vent support at night.  Admit weight: 100 kg Current weight: 99.1 kg  Pt with non-pitting edema to BUE and +2 pitting edema to BLE.  Medications reviewed and include: SSI q 4 hours, levemir 30 units BID, IV reglan 5 mg q 8 hours, nystatin mouthwash, protonix  Labs reviewed: BUN 36, creatinine 1.79, phosphorus 2.3 (s/p replacement orders),  magnesium 1.6 (s/p replacement orders), hemoglobin 7.9 CBG's: 174-265 x 24 hours  UOP: 1400 ml x 24 hours Stool: 35 ml via rectal tube + 2 unmeasured occurrences x 24 hours I/O's: -1.6 L since admit  Diet Order:   Diet Order             Diet Carb Modified Fluid consistency: Nectar Thick; Room service appropriate? Yes  Diet effective now                   EDUCATION NEEDS:   Not appropriate for education at this time  Skin:  Skin Assessment: Reviewed RN Assessment  Last BM:  10/19/21 rectal tube  Height:   Ht Readings from Last 1 Encounters:  10/05/21 5\' 6"  (1.676 m)    Weight:   Wt Readings from Last 1 Encounters:  10/19/21 99.1 kg    Ideal Body Weight:  59.1 kg  BMI:  Body mass index is 35.26 kg/m.  Estimated Nutritional Needs:   Kcal:  1900-2100  Protein:  >/= 115 gm  Fluid:  >2L    Gustavus Bryant, MS, RD, LDN Inpatient Clinical Dietitian Please see AMiON for contact information.

## 2021-10-21 DIAGNOSIS — G9340 Encephalopathy, unspecified: Secondary | ICD-10-CM | POA: Diagnosis not present

## 2021-10-21 DIAGNOSIS — R112 Nausea with vomiting, unspecified: Secondary | ICD-10-CM | POA: Diagnosis not present

## 2021-10-21 DIAGNOSIS — I10 Essential (primary) hypertension: Secondary | ICD-10-CM

## 2021-10-21 DIAGNOSIS — J9601 Acute respiratory failure with hypoxia: Secondary | ICD-10-CM | POA: Diagnosis not present

## 2021-10-21 DIAGNOSIS — J384 Edema of larynx: Secondary | ICD-10-CM | POA: Diagnosis not present

## 2021-10-21 DIAGNOSIS — J383 Other diseases of vocal cords: Secondary | ICD-10-CM | POA: Diagnosis not present

## 2021-10-21 LAB — COMPREHENSIVE METABOLIC PANEL
ALT: 21 U/L (ref 0–44)
AST: 13 U/L — ABNORMAL LOW (ref 15–41)
Albumin: 1.9 g/dL — ABNORMAL LOW (ref 3.5–5.0)
Alkaline Phosphatase: 129 U/L — ABNORMAL HIGH (ref 38–126)
Anion gap: 7 (ref 5–15)
BUN: 34 mg/dL — ABNORMAL HIGH (ref 6–20)
CO2: 28 mmol/L (ref 22–32)
Calcium: 10 mg/dL (ref 8.9–10.3)
Chloride: 107 mmol/L (ref 98–111)
Creatinine, Ser: 2 mg/dL — ABNORMAL HIGH (ref 0.44–1.00)
GFR, Estimated: 29 mL/min — ABNORMAL LOW (ref >60)
Glucose, Bld: 147 mg/dL — ABNORMAL HIGH (ref 70–99)
Potassium: 4 mmol/L (ref 3.5–5.1)
Sodium: 142 mmol/L (ref 135–145)
Total Bilirubin: 0.3 mg/dL (ref 0.3–1.2)
Total Protein: 6.2 g/dL — ABNORMAL LOW (ref 6.5–8.1)

## 2021-10-21 LAB — PHOSPHORUS: Phosphorus: 3.6 mg/dL (ref 2.5–4.6)

## 2021-10-21 LAB — CBC
HCT: 26.9 % — ABNORMAL LOW (ref 36.0–46.0)
Hemoglobin: 7.7 g/dL — ABNORMAL LOW (ref 12.0–15.0)
MCH: 28.2 pg (ref 26.0–34.0)
MCHC: 28.6 g/dL — ABNORMAL LOW (ref 30.0–36.0)
MCV: 98.5 fL (ref 80.0–100.0)
Platelets: 363 10*3/uL (ref 150–400)
RBC: 2.73 MIL/uL — ABNORMAL LOW (ref 3.87–5.11)
RDW: 15 % (ref 11.5–15.5)
WBC: 11.2 10*3/uL — ABNORMAL HIGH (ref 4.0–10.5)
nRBC: 0.2 % (ref 0.0–0.2)

## 2021-10-21 LAB — IRON AND TIBC
Iron: 55 ug/dL (ref 28–170)
Saturation Ratios: 24 % (ref 10.4–31.8)
TIBC: 228 ug/dL — ABNORMAL LOW (ref 250–450)
UIBC: 173 ug/dL

## 2021-10-21 LAB — RETICULOCYTES
Immature Retic Fract: 35.5 % — ABNORMAL HIGH (ref 2.3–15.9)
RBC.: 2.7 MIL/uL — ABNORMAL LOW (ref 3.87–5.11)
Retic Count, Absolute: 128.8 10*3/uL (ref 19.0–186.0)
Retic Ct Pct: 4.8 % — ABNORMAL HIGH (ref 0.4–3.1)

## 2021-10-21 LAB — FOLATE: Folate: 14.4 ng/mL (ref >5.9)

## 2021-10-21 LAB — VITAMIN B12: Vitamin B-12: 684 pg/mL (ref 180–914)

## 2021-10-21 LAB — MAGNESIUM: Magnesium: 2.3 mg/dL (ref 1.7–2.4)

## 2021-10-21 LAB — GLUCOSE, CAPILLARY
Glucose-Capillary: 122 mg/dL — ABNORMAL HIGH (ref 70–99)
Glucose-Capillary: 130 mg/dL — ABNORMAL HIGH (ref 70–99)
Glucose-Capillary: 131 mg/dL — ABNORMAL HIGH (ref 70–99)
Glucose-Capillary: 143 mg/dL — ABNORMAL HIGH (ref 70–99)
Glucose-Capillary: 143 mg/dL — ABNORMAL HIGH (ref 70–99)
Glucose-Capillary: 87 mg/dL (ref 70–99)

## 2021-10-21 LAB — FERRITIN: Ferritin: 213 ng/mL (ref 11–307)

## 2021-10-21 MED ORDER — METOCLOPRAMIDE HCL 5 MG/ML IJ SOLN: 5.0000 mg | Freq: Three times a day (TID) | INTRAMUSCULAR | Status: DC | PRN

## 2021-10-21 MED ORDER — CLONAZEPAM 1 MG PO TABS
1.0000 mg | ORAL_TABLET | Freq: Two times a day (BID) | ORAL | Status: DC
  Administered 2021-10-21 – 2021-10-24 (×6): 1 mg via ORAL
  Filled 2021-10-21 (×6): qty 1

## 2021-10-21 MED ORDER — QUETIAPINE FUMARATE 50 MG PO TABS
50.0000 mg | ORAL_TABLET | Freq: Two times a day (BID) | ORAL | Status: DC
  Administered 2021-10-21 – 2021-11-09 (×38): 50 mg via ORAL
  Filled 2021-10-21 (×39): qty 1

## 2021-10-21 NOTE — Progress Notes (Signed)
° °  Inpatient Rehab Admissions Coordinator : ° °Per therapy change in recommendations, patient was screened for CIR candidacy by Keylie Beavers RN MSN.  At this time patient appears to be a potential candidate for CIR. I will place a rehab consult per protocol for full assessment. Please call me with any questions. ° °Presli Fanguy RN MSN °Admissions Coordinator °[phone] °  °
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
## 2021-10-21 NOTE — Progress Notes (Signed)
NAMEBiddie Jones, MRN:  [id number], DOB:  [birth year], LOS: 72 ADMISSION DATE:  09/20/2021, CONSULTATION DATE: 10/10 REFERRING MD:  Dr. Laverta [place], CHIEF COMPLAINT:  AMS   History of Present Illness:  55 y/o F who presented to [facility] on 10/10 with reports of acute altered mental status secondary to hypertensive emergency with hypoxic respiratory failure related to VCD/post intubation subglottic and glottic edema/vocal hoarseness  +/- asthmatic exacerbation.  Ultimately required trach placement.   Transferred back to ICU on 11/2 after trach became dislodged on floor, resulting in PEA arrest with 10 min CPR and 2mg  epi. ROSC obtained. Trach revised to 6.0 XLT.  Starting to stabilize  Pertinent  Medical History  HTN DM   Significant Hospital Events: Including procedures, antibiotic start and stop dates in addition to other pertinent events   10/10 Admit with acute encephalopathy, hypertensive 10/12 extubated, increased WOB, re-intubated 10/14 extubated , lingering stridor, Received racemic epi x2 over the last 24 hours + another dose of Decadron, Mild agitation requiring restart of Precedex 10/15 swallow evaluation >> dysphagia 3 diet 10/20 decompensated after switch to PO steroids 10/21 better after CAT and back on IV steroids 10/22 agitated back on  BIPAP 10/25 stridor again. Marked work of breathing. Intubated, airway noises, stridor and wheezing completely resolved post intubation. Spoke to ENT who will follow after trach. Trach placed by Dr Vaughan Browner. Seroquel increased to 100 qhs, changes solumedrol to prednisone to begin taper, rested on propofol over night. Tubefeeds started. RUE PICC placed  10/26 prop weaning off attempting ATC  11/2 PEA arrest in setting of dislodged tracheostomy. questionable syncope.  Total 10 minutes CPR. Transferred to intensive care unit intubated via trach stoma for redo trach 11/2/202.  Redo tracheostomy. 6 XLT. 11/4 Concern overnight for STEMI, hep gtt  started, cards consulted; this was artifact 11/5 changed out distal XLT for proximal due to positioning within trachea 11/8 tolerated trach collar for about 6 hours yesterday 11/7, placed back on vent  11/10 did not require vent overnight   Interim History / Subjective:  Nausea/vomiting resolved Ambulated well today  Objective   Blood pressure (!) 173/68, pulse 96, temperature 99.6 F (37.6 C), temperature source Oral, resp. rate 12, height 5\' 6"  (1.676 m), weight 101.6 kg, SpO2 94 %.    FiO2 (%):  [40 %-45 %] 40 %   Intake/Output Summary (Last 24 hours) at 10/21/2021 9373 Last data filed at 10/21/2021 4287 Gross per 24 hour  Intake 513.6 ml  Output 450 ml  Net 63.6 ml   Filed Weights   10/18/21 0407 10/19/21 0327 10/21/21 0459  Weight: 100.9 kg 99.1 kg 101.6 kg    Examination: Sitting out in a chair Following commands Minimal rhonchi Trach in place S1-S2 appreciated   Labs reviewed  Resolved Hospital Problem list   RML/RLL PNA (treated) Hypertensive emergency   Assessment & Plan:   Acute hypoxemic respiratory failure history of vocal cord dysfunction Subglottic and glottic edema -Status post tracheostomy -Tracheostomy dislodgment led to PEA arrest on 11/2-required CPR for 10 minutes  Overnight has not required to be on a ventilator  Was able to ambulate in hallway today  Continue trach management Pulmonary toileting  Will transfer to progressive today  Best Practice (right click and "Reselect all SmartList Selections" daily)   Diet/type: tubefeeds DVT prophylaxis: heparin subQ GI prophylaxis: PPI Lines: PICC Foley:  N/A Code Status:  full code Last date of multidisciplinary goals of care discussion [pending]  Sherrilyn Rist, MD  [place] [facility] Pager: See Shea Evans

## 2021-10-21 NOTE — Progress Notes (Signed)
[facility] PROGRESS NOTE   Maureen Jones BWG:[id number] DOB: [birth year] DOA: 09/20/2021  PCP: Pcp, No  Brief History/Interval Summary: 55 y/o F who presented to [facility] on 10/10 with reports of acute altered mental status secondary to hypertensive emergency with hypoxic respiratory failure related to VCD/post intubation subglottic and glottic edema/vocal hoarseness  +/- asthmatic exacerbation.  Ultimately required trach placement. Transferred back to ICU on 11/2 after trach became dislodged on floor, resulting in PEA arrest with 10 min CPR and 47m epi. ROSC obtained. Trach revised to 6.0 XLT.   Reason for Visit: Acute respiratory failure with hypoxia  Consultants: Critical care medicine.  Significant Hospital Events: Including procedures, antibiotic start and stop dates in addition to other pertinent events   10/10 Admit with acute encephalopathy, hypertensive 10/12 extubated, increased WOB, re-intubated 10/14 extubated , lingering stridor, Received racemic epi x2 over the last 24 hours + another dose of Decadron, Mild agitation requiring restart of Precedex 10/15 swallow evaluation >> dysphagia 3 diet 10/20 decompensated after switch to PO steroids 10/21 better after CAT and back on IV steroids 10/22 agitated back on  BIPAP 10/25 stridor again. Marked work of breathing. Intubated, airway noises, stridor and wheezing completely resolved post intubation. Spoke to ENT who will follow after trach. Trach placed by Dr MVaughan Browner Seroquel increased to 100 qhs, changes solumedrol to prednisone to begin taper, rested on propofol over night. Tubefeeds started. RUE PICC placed  10/26 prop weaning off attempting ATC  11/2 PEA arrest in setting of dislodged tracheostomy. questionable syncope.  Total 10 minutes CPR. Transferred to intensive care unit intubated via trach stoma for redo trach 11/2/202.  Redo tracheostomy. 6 XLT. 11/4 Concern overnight for STEMI, hep gtt started, cards consulted;  this was artifact 11/5 changed out distal XLT for proximal due to positioning within trachea 11/8 tolerated trach collar for about 6 hours 11/7, placed back on vent      Subjective/Interval History: Patient has not required ventilatory support since yesterday morning.  Having coughing episodes this morning.  Denies any chest pain.  No further episodes of nausea or vomiting either.      Assessment/Plan:  Nausea and vomiting Etiology for this is unclear.  Abdominal imaging study was ordered by critical care medicine which was unremarkable.  Lipase level and LFTs were unremarkable.  Creatinine gastritis.  PPI dose was increased.  No further episodes noted.  Continue to monitor.  Looks like she was started on Reglan yesterday.  We will change to as needed.  Acute respiratory failure with hypoxia with history of vocal cord dysfunction/subglottic and glottic edema She is status post tracheostomy.  She had dislodgment of his tracheostomy on 11/2 leading to PEA arrest.  She required CPR for 10 minutes. Critical care medicine is following.  Requiring ventilator support on and off.  None since yesterday morning.  Healthcare associated pneumonia Completed 7 days of antibiotics.  Stable.  Agitation Seems to be mentating better.  Noted to be on clonazepam and Seroquel.  Could potentially decrease the doses of these medications depending on her mentation.  Will discuss with CCM.  Essential hypertension Noted to be on hydralazine, nitrates, carvedilol.  Dose of hydralazine was increased yesterday.  Blood pressure remains elevated.  Continue to monitor trends.  May need to further increase the dose of hydralazine.    Diabetes mellitus type 2, uncontrolled with hyperglycemia HbA1c 12.7.  CBGs are reasonably well controlled.  Dose of Levemir was decreased yesterday as she pulled out her feeding tube and  there is no plan to replace it at this time since she is on oral diet.  Continue SSI.    Chronic  kidney disease stage IIIb Presented with elevated creatinine.  No old labs in our system.  Care everywhere was reviewed.  She had a creatinine of 1.7 last year.  Likely has chronic kidney disease.  Renal function stable for the most part.  Monitor urine output.  Avoid nephrotoxic agents.  Normocytic anemia Likely anemia of critical illness.  No evidence of overt bleeding.  Anemia panel does not show any clear-cut deficiencies.  Hypomagnesemia and hypophosphatemia Pharmacy is addressing.  Magnesium noted to be 2.3 today.  Phosphorus 3.6  Dysphagia Secondary to tracheostomy.  See below.  Nutrition Was on tube feedings.  She pulled out her contract yesterday.  Plan is to continue just with oral diet for now.  She is on carb modified diet with nectar thick liquids.  Obesity Estimated body mass index is 36.15 kg/m as calculated from the following:   Height as of this encounter: _0  (1.676 m).   Weight as of this encounter: 101.6 kg.    DVT Prophylaxis: Subcutaneous heparin Code Status: Partial code Family Communication: Discussed with patient. Disposition Plan: Home health recommended by PT.  We will need to make sure that her respiratory status remains stable for several days before she can be discharged home.  Status is: Inpatient  Remains inpatient appropriate because: Acute respiratory failure requiring mechanical ventilation on and off      Medications: Scheduled:  arformoterol  15 mcg Nebulization BID   aspirin  81 mg Oral Daily   budesonide (PULMICORT) nebulizer solution  0.25 mg Nebulization BID   carvedilol  6.25 mg Oral BID   chlorhexidine gluconate (MEDLINE KIT)  15 mL Mouth Rinse BID   Chlorhexidine Gluconate Cloth  6 each Topical Daily   clonazePAM  2 mg Oral BID   FLUoxetine  20 mg Oral Daily   fluticasone  2 spray Each Nare Daily   heparin injection (subcutaneous)  5,000 Units Subcutaneous Q8H   hydrALAZINE  75 mg Per Tube BID   insulin aspart  0-20 Units  Subcutaneous Q4H   insulin detemir  20 Units Subcutaneous BID   isosorbide dinitrate  10 mg Per Tube BID   lidocaine  2 patch Transdermal Q24H   loratadine  10 mg Per Tube Daily   mouth rinse  15 mL Mouth Rinse 10 times per day   metoCLOPramide (REGLAN) injection  5 mg Intravenous Q8H   montelukast  10 mg Oral QHS   nystatin  5 mL Oral QID   pantoprazole  40 mg Oral BID   QUEtiapine  100 mg Oral BID   sodium chloride flush  10-40 mL Intracatheter Q12H   Continuous:  sodium chloride 10 mL/hr at 10/21/21 0370   WUG:QBVQXI chloride, acetaminophen, acetaminophen, albuterol, clonazePAM, dextrose, docusate sodium, guaiFENesin, hydrALAZINE, nitroGLYCERIN, ondansetron (ZOFRAN) IV, phenol, polyethylene glycol, prochlorperazine  Antibiotics: Anti-infectives (From admission, onward)    Start     Dose/Rate Route Frequency Ordered Stop   10/18/21 0830  ceFEPIme (MAXIPIME) 2 g in sodium chloride 0.9 % 100 mL IVPB  Status:  Discontinued        2 g 200 mL/hr over 30 Minutes Intravenous Every 24 hours 10/17/21 1055 10/17/21 1056   10/17/21 2200  ceFEPIme (MAXIPIME) 2 g in sodium chloride 0.9 % 100 mL IVPB        2 g 200 mL/hr over 30 Minutes Intravenous Every  12 hours 10/17/21 1056 10/18/21 2145   10/14/21 0830  ceFEPIme (MAXIPIME) 2 g in sodium chloride 0.9 % 100 mL IVPB  Status:  Discontinued        2 g 200 mL/hr over 30 Minutes Intravenous Every 24 hours 10/14/21 0721 10/17/21 1055   10/14/21 0215  piperacillin-tazobactam (ZOSYN) IVPB 3.375 g        3.375 g 100 mL/hr over 30 Minutes Intravenous  Once 10/14/21 0126 10/14/21 0302   10/04/21 1100  vancomycin (VANCOREADY) IVPB 1250 mg/250 mL  Status:  Discontinued        1,250 mg 166.7 mL/hr over 90 Minutes Intravenous Every 48 hours 10/02/21 0939 10/03/21 1508   10/02/21 2200  ceFEPIme (MAXIPIME) 2 g in sodium chloride 0.9 % 100 mL IVPB  Status:  Discontinued        2 g 200 mL/hr over 30 Minutes Intravenous Every 12 hours 10/02/21 0948 10/04/21  1010   10/02/21 1030  vancomycin (VANCOREADY) IVPB 2000 mg/400 mL        2,000 mg 200 mL/hr over 120 Minutes Intravenous  Once 10/02/21 0939 10/02/21 1324   09/27/21 0900  ceFEPIme (MAXIPIME) 2 g in sodium chloride 0.9 % 100 mL IVPB  Status:  Discontinued        2 g 200 mL/hr over 30 Minutes Intravenous Every 12 hours 09/27/21 0800 10/02/21 0948   09/27/21 0900  vancomycin (VANCOREADY) IVPB 1500 mg/300 mL  Status:  Discontinued        1,500 mg 150 mL/hr over 120 Minutes Intravenous Every 48 hours 09/27/21 0800 09/28/21 1058   09/21/21 0800  cefTRIAXone (ROCEPHIN) 2 g in sodium chloride 0.9 % 100 mL IVPB        2 g 200 mL/hr over 30 Minutes Intravenous Every 24 hours 09/21/21 0742 09/25/21 0808   09/20/21 0945  cefTRIAXone (ROCEPHIN) 2 g in sodium chloride 0.9 % 100 mL IVPB  Status:  Discontinued        2 g 200 mL/hr over 30 Minutes Intravenous Every 24 hours 09/20/21 0936 09/21/21 0742       Objective:  Vital Signs  Vitals:   10/21/21 0755 10/21/21 0804 10/21/21 0808 10/21/21 0809  BP:    (!) 173/68  Pulse:    96  Resp:    12  Temp:      TempSrc:      SpO2: 92% 94% 93% 94%  Weight:      Height:        Intake/Output Summary (Last 24 hours) at 10/21/2021 0841 Last data filed at 10/21/2021 8003 Gross per 24 hour  Intake 513.6 ml  Output 450 ml  Net 63.6 ml    Filed Weights   10/18/21 0407 10/19/21 0327 10/21/21 0459  Weight: 100.9 kg 99.1 kg 101.6 kg    General appearance: Awake alert.  In no distress Tracheostomy is noted. Resp: Coarse breath sounds bilaterally with few crackles at the bases.  No wheezing or rhonchi. Cardio: S1-S2 is normal regular.  No S3-S4.  No rubs murmurs or bruit GI: Abdomen is soft.  Nontender today.  No masses organomegaly.  Bowel sounds present. Extremities: Moving all 4 extremities.  No significant edema. Neurologic: Alert and oriented x3.  No focal neurological deficits.     Lab Results:  Data Reviewed: I have personally reviewed  following labs and imaging studies  CBC: Recent Labs  Lab 10/17/21 0359 10/18/21 0405 10/19/21 0319 10/20/21 0411 10/21/21 0506  WBC 9.8 11.3* 10.2 12.0* 11.2*  NEUTROABS  --   --   --  8.9*  --   HGB 7.2* 7.3* 7.0* 7.9* 7.7*  HCT 24.9* 25.2* 24.4* 27.3* 26.9*  MCV 97.3 98.8 100.0 98.6 98.5  PLT 251 283 279 330 363     Basic Metabolic Panel: Recent Labs  Lab 10/15/21 0339 10/15/21 1700 10/16/21 0408 10/17/21 0359 10/18/21 0405 10/19/21 0319 10/20/21 0411 10/20/21 2040 10/21/21 0506  NA 137  --  140 141 144 147* 145  --  142  K 4.6  --  4.4 4.7 4.7 4.7 4.6  --  4.0  CL 103  --  106 109 111 113* 109  --  107  CO2 24  --  _0 --  28  GLUCOSE 191*  --  162* 298* 247* 189* 197*  --  147*  BUN 55*  --  50* 47* 42* 41* 36*  --  34*  CREATININE 2.59*  --  2.44* 2.15* 2.00* 1.99* 1.79*  --  2.00*  CALCIUM 9.3  --  9.3 9.5 9.7 9.9 10.1  --  10.0  MG 2.1 2.1 2.1 2.1  --   --  1.6* 2.5* 2.3  PHOS 4.2 3.9 3.4  --   --   --  2.3*  --  3.6     GFR: Estimated Creatinine Clearance: 38.7 mL/min (A) (by C-G formula based on SCr of 2 mg/dL (H)).  Liver Function Tests: Recent Labs  Lab 10/20/21 0411 10/21/21 0506  AST 13* 13*  ALT 23 21  ALKPHOS 125 129*  BILITOT 0.7 0.3  PROT 6.7 6.2*  ALBUMIN 1.9* 1.9*    Recent Labs  Lab 10/20/21 0411  LIPASE 47   No results for input(s): AMMONIA in the last 168 hours.    CBG: Recent Labs  Lab 10/20/21 1530 10/20/21 1946 10/20/21 2329 10/21/21 0345 10/21/21 0727  GLUCAP 171* 147* 194* 143* 130*      Recent Results (from the past 240 hour(s))  Expectorated Sputum Assessment w Gram Stain, Rflx to Resp Cult     Status: None   Collection Time: 10/12/21  1:59 PM   Specimen: Expectorated Sputum  Result Value Ref Range Status   Specimen Description EXPECTORATED SPUTUM  Final   Special Requests NONE  Final   Sputum evaluation   Final    THIS SPECIMEN IS ACCEPTABLE FOR SPUTUM CULTURE Performed at [facility], [place] [address], [place], [place] [address]    Report Status 10/12/2021 FINAL  Final  Culture, Respiratory w Gram Stain     Status: None   Collection Time: 10/12/21  1:59 PM  Result Value Ref Range Status   Specimen Description EXPECTORATED SPUTUM  Final   Special Requests NONE Reflexed from [phone]  Final   Gram Stain   Final    FEW WBC PRESENT, PREDOMINANTLY PMN FEW GRAM NEGATIVE RODS FEW GRAM POSITIVE COCCI IN PAIRS AND CHAINS    Culture   Final    Normal respiratory flora-no Staph aureus or Pseudomonas seen Performed at [facility], [address] [place], [place] [address]    Report Status 10/15/2021 FINAL  Final  Culture, Respiratory w Gram Stain     Status: None   Collection Time: 10/14/21  9:09 AM   Specimen: Tracheal Aspirate; Respiratory  Result Value Ref Range Status   Specimen Description TRACHEAL ASPIRATE  Final   Special Requests NONE  Final   Gram Stain   Final  MODERATE WBC PRESENT,BOTH PMN AND MONONUCLEAR FEW GRAM POSITIVE COCCI IN PAIRS RARE GRAM NEGATIVE RODS    Culture   Final    FEW Normal respiratory flora-no Staph aureus or Pseudomonas seen Performed at [facility], [address] [place], [place] [address]    Report Status 10/16/2021 FINAL  Final  MRSA Next Gen by PCR, Nasal     Status: None   Collection Time: 10/14/21 10:48 AM   Specimen: Nasal Mucosa; Nasal Swab  Result Value Ref Range Status   MRSA by PCR Next Gen NOT DETECTED NOT DETECTED Final    Comment: (NOTE) The GeneXpert MRSA Assay (FDA approved for NASAL specimens only), is one component of a comprehensive MRSA colonization surveillance program. It is not intended to diagnose MRSA infection nor to guide or monitor treatment for MRSA infections. Test performance is not FDA approved in patients less than 35 years old. Performed at [facility], [place] [address], [place], [place] [address]   Culture, Respiratory w Gram Stain     Status: None   Collection  Time: 10/16/21 12:33 PM   Specimen: Tracheal Aspirate; Respiratory  Result Value Ref Range Status   Specimen Description TRACHEAL ASPIRATE  Final   Special Requests NONE  Final   Gram Stain   Final    FEW SQUAMOUS EPITHELIAL CELLS PRESENT MODERATE WBC SEEN FEW GRAM POSITIVE COCCI    Culture   Final    RARE Normal respiratory flora-no Staph aureus or Pseudomonas seen Performed at [facility], [address] [place],  [address]    Report Status 10/18/2021 FINAL  Final       Radiology Studies: DG Abd Portable 1V  Result Date: 10/20/2021 CLINICAL DATA:  Abdominal distension EXAM: PORTABLE ABDOMEN - 1 VIEW COMPARISON:  Abdominal x-ray 10/16/2019 FINDINGS: Enteric tube is been removed since previous study. Overall paucity of bowel gas with no distended gas-filled loops of bowel identified to suggest obstruction. There is residual oral contrast seen in the colon. IMPRESSION: No definite acute process identified. Electronically Signed   By: Ofilia Neas M.D.   On: 10/20/2021 12:15   DG Swallowing Func-Speech Pathology  Result Date: 10/19/2021 Table formatting from the original result was not included. Objective Swallowing Evaluation: Type of Study: MBS-Modified Barium Swallow Study  Patient Details Name: Maureen Jones MRN: [id number] Date of Birth: 1966-08-05 Today's Date: 10/19/2021 Time: SLP Start Time (ACUTE ONLY): 1502 -SLP Stop Time (ACUTE ONLY): 1523 SLP Time Calculation (min) (ACUTE ONLY): 21 min Past Medical History: Past Medical History: Diagnosis Date  Asthma   Diabetes ([place])   Glottic and subglottic edema 2022  Hypertension  Past Surgical History: No past surgical history on file. HPI: Pt is a 55 y/o female admitted with AMS 10/10. Stroke w/u negative, dx with acute metabolic encephalopathy in the setting of hypertensive emergency. ETT 10/10-10/12; reintubated 10/12 d/t increased WOB, not mobilizing secretions, stridor, received steroids, extubated 10/14 with  lingering stridor, which was improving 10/15. ENT scoped pt 10/18: "Edema of the glottis and subglottic larynx following recent intubation and then repeat intubation.  There is no evidence of cord paralysis or any tumor present.  There is currently no stridor.  Allow more time for resolution of the swelling.  Unfortunately she is at risk for developing subglottic stenosis." SLP followed pt 10/15-10/24. MBS 10/19: trace posterior spillage, mild pharyngeal delay, trace transient penetration; regular texture diet with thin liquids recommended and SLP ultimately signed off with pt demonstrating adequate tolerance.  10/25: Stridor again noted; pt intubated and trach placed.  MBS 10/27: oropharyngeal dysphagia characterized by reduced bolus cohesion, reduced hyolaryngeal elevation, reduced anterior laryngeal movement, a pharyngeal delay, penetration (PAS 3, 5) with thin liquids. A dyspahgia 3 diet with nectar thick liquids was initiated and she was subsequently advanced to regular and thin liquids on 10/30. Pt with respiratory distress from disloged trach on 11/2 leading to PEA arrest with CPR x 10 min. ETT via stoma. Trach revised 11/2; #6XLT distal.  Cortrak placed 11/4. Trach changed from 6XL distal to 6XL proximal 11/5.  Subjective: Pt seen in radiology. No family present  Recommendations for follow up therapy are one component of a multi-disciplinary discharge planning process, led by the attending physician.  Recommendations may be updated based on patient status, additional functional criteria and insurance authorization. Assessment / Plan / Recommendation Clinical Impressions 10/19/2021 Clinical Impression Pt with trach collar was seen for an MBS. MBS completed without PMV d/t pt inability to tolerate valve this date. Overall, pt presents with mild pharyngeal dysphagia resulting in penetration above the level of the vocal folds which does not clear spontaneously (PAS 3) with small and large sips of thin liquid  secondary to delayed swallow initation at the level of the vallecula resulting in impaired swallow timing. Pt additionally exhibited reduced anterior laryngeal mobility and laryngeal elevation however this did not appear to diminish airway protection. Across consistencies, pt exhibited no oral phase impairments. Pharyngeally, pt exhibited aforementioned penetration during swallow with small and large sips of thin liquid however exhibited no impairments with nectar thick liquid or regular solids. SLP unable to test pt for stimulability of compensatory strategies (throat clear, breath holding) d/t current trach without PMV status. Recommend Regular/Nectar thick liquid diet, administering medications whole in puree. SLP will continue to follow pt for management of diet tolerance and upgraded PO trials as appropriate. SLP Visit Diagnosis Dysphagia, pharyngeal phase (R13.13) Attention and concentration deficit following -- Frontal lobe and executive function deficit following -- Impact on safety and function Mild aspiration risk   Treatment Recommendations 10/19/2021 Treatment Recommendations Therapy as outlined in treatment plan below   Prognosis 10/19/2021 Prognosis for Safe Diet Advancement Good Barriers to Reach Goals -- Barriers/Prognosis Comment -- Diet Recommendations 10/19/2021 SLP Diet Recommendations Regular solids;Nectar thick liquid Liquid Administration via Straw;Cup Medication Administration Whole meds with puree Compensations Slow rate;Small sips/bites Postural Changes Remain semi-upright after after feeds/meals (Comment);Seated upright at 90 degrees   Other Recommendations 10/19/2021 Recommended Consults -- Oral Care Recommendations Oral care BID;Patient independent with oral care Other Recommendations Order thickener from pharmacy Follow Up Recommendations Other (comment) Assistance recommended at discharge PRN Functional Status Assessment Patient has had a recent decline in their functional status and  demonstrates the ability to make significant improvements in function in a reasonable and predictable amount of time. Frequency and Duration  10/19/2021 Speech Therapy Frequency (ACUTE ONLY) min 2x/week Treatment Duration 2 weeks   Oral Phase 10/19/2021 Oral Phase WFL Oral - Pudding Teaspoon -- Oral - Pudding Cup -- Oral - Honey Teaspoon -- Oral - Honey Cup -- Oral - Nectar Teaspoon -- Oral - Nectar Cup WFL Oral - Nectar Straw WFL Oral - Thin Teaspoon -- Oral - Thin Cup WFL Oral - Thin Straw NT Oral - Puree NT Oral - Mech Soft NT Oral - Regular WFL Oral - Multi-Consistency -- Oral - Pill -- Oral Phase - Comment --  Pharyngeal Phase 10/19/2021 Pharyngeal Phase Impaired Pharyngeal- Pudding Teaspoon -- Pharyngeal -- Pharyngeal- Pudding Cup --  Pharyngeal -- Pharyngeal- Honey Teaspoon -- Pharyngeal -- Pharyngeal- Honey Cup -- Pharyngeal -- Pharyngeal- Nectar Teaspoon -- Pharyngeal -- Pharyngeal- Nectar Cup WFL;Reduced anterior laryngeal mobility;Reduced laryngeal elevation Pharyngeal -- Pharyngeal- Nectar Straw WFL;Reduced anterior laryngeal mobility;Reduced laryngeal elevation Pharyngeal -- Pharyngeal- Thin Teaspoon -- Pharyngeal -- Pharyngeal- Thin Cup Penetration/Aspiration during swallow;Delayed swallow initiation-vallecula;Reduced anterior laryngeal mobility;Reduced laryngeal elevation Pharyngeal Material enters airway, remains ABOVE vocal cords and not ejected out Pharyngeal- Thin Straw NT Pharyngeal -- Pharyngeal- Puree NT Pharyngeal -- Pharyngeal- Mechanical Soft -- Pharyngeal -- Pharyngeal- Regular WFL;Reduced anterior laryngeal mobility;Reduced laryngeal elevation Pharyngeal -- Pharyngeal- Multi-consistency -- Pharyngeal -- Pharyngeal- Pill NT Pharyngeal -- Pharyngeal Comment --  Cervical Esophageal Phase  10/07/2021 Cervical Esophageal Phase WFL Pudding Teaspoon -- Pudding Cup -- Honey Teaspoon -- Honey Cup -- Nectar Teaspoon -- Nectar Cup -- Nectar Straw -- Thin Teaspoon -- Thin Cup -- Thin Straw -- Puree --  Mechanical Soft -- Regular -- Multi-consistency -- Pill -- Cervical Esophageal Comment -- [place] Siren 10/19/2021, 3:56 PM                         LOS: 31 days   [place] [employer] on [email or web address]  10/21/2021, 8:41 AM

## 2021-10-21 NOTE — Progress Notes (Signed)
Occupational Therapy Treatment Patient Details Name: Maureen Jones MRN: [id number] DOB: February 19, 1966 Today's Date: 10/21/2021   History of present illness 55 y.o. female admitted 09/20/21 with AMS. 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 CXR with low lung volumes, L>R interstitial opacities. Acute metabolic encephalopathy in setting of HTN urgency, possible PRES. ETT (10/10-10/12, 10/12-10/14). Course complicated by confusion/agitation overnight 10/17. 10/25 trach and bronch with return to vent.  11/2 pt passed out while returning from the bathroom pt with respiratory distress from disloged trach leading to PEA arrest with CPR x 10 min. ETT via stoma.  Trach revised 11/2.  PMHx:HTN, DM.   OT comments  Patient supine in bed and agreeable to OT.  Completing bed mobility with min assist today, session limited to sitting EOB due to lethargy.  Pt following simple commands, but demonstrating difficulty maintaining eyes open without constant cueing/stimulation.  She was able to sit EOB with supervision, but requires up to mod assist to comb hair with limited tolerance and at times proximal support due to weakness.  Engaged in UE exercises at EOB, mild edema noted in L UE.  On Trach Collar during session at 10L, 40% FiO2 with VSS (SpO2 maintained 90%).  Updated dc plan to CIR at this time, due to decreased activity tolerance, generalized weakness from prolonged complicated hospital stay in order to optimize independence for daily routine.    Recommendations for follow up therapy are one component of a multi-disciplinary discharge planning process, led by the attending physician.  Recommendations may be updated based on patient status, additional functional criteria and insurance authorization.    Follow Up Recommendations  Acute inpatient rehab (3hours/day)    Assistance Recommended at Discharge Frequent or constant Supervision/Assistance  Equipment Recommendations  BSC/3in1    Recommendations for  Other Services      Precautions / Restrictions Precautions Precautions: Fall Precaution Comments: trach, picc Restrictions Weight Bearing Restrictions: No       Mobility Bed Mobility Overal bed mobility: Needs Assistance Bed Mobility: Supine to Sit;Sit to Supine     Supine to sit: Min assist Sit to supine: Min assist   General bed mobility comments: min assist to elevate trunk and initate laying back down    Transfers Overall transfer level: Needs assistance   Transfers: Sit to/from Stand Sit to Stand: Min guard           General transfer comment: deferred due to lethargy     Balance Overall balance assessment: Needs assistance Sitting-balance support: No upper extremity supported;Feet supported Sitting balance-[name]: Good Sitting balance - Comments: EOB without physical assist   Standing balance support: Bilateral upper extremity supported Standing balance-[name]: Poor Standing balance comment: RW for standing and mobility                           ADL either performed or assessed with clinical judgement   ADL Overall ADL's : Needs assistance/impaired     Grooming: Moderate assistance;Brushing hair;Sitting Grooming Details (indicate cue type and reason): sitting EOB mod assist to comb hair, requires frequent rest breaks and proximal support at times due to weakness                             Functional mobility during ADLs: Minimal assistance (limited to bed mobility)      Extremity/Trunk Assessment              Vision  Perception     Praxis      Cognition Arousal/Alertness: Lethargic Behavior During Therapy: Flat affect Overall Cognitive Status: Difficult to assess Area of Impairment: Memory                     Memory: Decreased short-term memory         General Comments: pt following commands well, but very lethargic.  Difficulty attending to task and keeping eyes open.  She is  oriented to self and time. Continue assessment.          Exercises Exercises: General Upper Extremity General Exercises - Upper Extremity Shoulder Flexion: AAROM;Both;10 reps;Seated Digit Composite Flexion: AROM;Both;10 reps;Seated Composite Extension: AROM;Both;10 reps;Seated General Exercises - Lower Extremity Long Arc Quad: AROM;Seated;Both;20 reps Hip ABduction/ADduction: AROM;Both;Seated;20 reps Hip Flexion/Marching: AROM;Seated;Both;20 reps   Shoulder Instructions       General Comments VSS on TC    Pertinent Vitals/ Pain       Pain Assessment: Faces Pain Score: 4  Faces Pain Scale: Hurts little more Pain Location: chest Pain Descriptors / Indicators: Sore;Aching Pain Intervention(s): Limited activity within patient's tolerance;Monitored during session;Repositioned  Home Living                                          Prior Functioning/Environment              Frequency  Min 2X/week        Progress Toward Goals  OT Goals(current goals can now be found in the care plan section)  Progress towards OT goals: Progressing toward goals (limited)  Acute Rehab OT Goals Time For Goal Achievement: 10/28/21 Potential to Achieve Goals: Good  Plan Frequency remains appropriate;Discharge plan needs to be updated    Co-evaluation                 AM-PAC OT "6 Clicks" Daily Activity     Outcome Measure   Help from another person eating meals?: A Lot Help from another person taking care of personal grooming?: A Lot Help from another person toileting, which includes using toliet, bedpan, or urinal?: A Lot Help from another person bathing (including washing, rinsing, drying)?: A Lot Help from another person to put on and taking off regular upper body clothing?: A Lot Help from another person to put on and taking off regular lower body clothing?: A Lot 6 Click Score: 12    End of Session Equipment Utilized During Treatment: Oxygen (trach  collar)  OT Visit Diagnosis: Other abnormalities of gait and mobility (R26.89);Other symptoms and signs involving cognitive function   Activity Tolerance Patient limited by lethargy   Patient Left in bed;with call bell/phone within reach;with bed alarm set   Nurse Communication Mobility status        Time: 7680-8811 OT Time Calculation (min): 19 min  Charges: OT General Charges $OT Visit: 1 Visit OT Treatments $Self Care/Home Management : 8-22 mins  Jolaine Artist, OT [place] Pager [phone] Office [phone]   Delight Stare 10/21/2021, 10:22 AM

## 2021-10-22 DIAGNOSIS — J9601 Acute respiratory failure with hypoxia: Secondary | ICD-10-CM | POA: Diagnosis not present

## 2021-10-22 DIAGNOSIS — I10 Essential (primary) hypertension: Secondary | ICD-10-CM | POA: Diagnosis not present

## 2021-10-22 DIAGNOSIS — R112 Nausea with vomiting, unspecified: Secondary | ICD-10-CM | POA: Diagnosis not present

## 2021-10-22 LAB — GLUCOSE, CAPILLARY
Glucose-Capillary: 104 mg/dL — ABNORMAL HIGH (ref 70–99)
Glucose-Capillary: 120 mg/dL — ABNORMAL HIGH (ref 70–99)
Glucose-Capillary: 121 mg/dL — ABNORMAL HIGH (ref 70–99)
Glucose-Capillary: 144 mg/dL — ABNORMAL HIGH (ref 70–99)
Glucose-Capillary: 145 mg/dL — ABNORMAL HIGH (ref 70–99)
Glucose-Capillary: 93 mg/dL (ref 70–99)

## 2021-10-22 MED ORDER — DULOXETINE HCL 60 MG PO CPEP
60.0000 mg | ORAL_CAPSULE | Freq: Every day | ORAL | Status: DC
  Administered 2021-10-22 – 2021-11-15 (×25): 60 mg via ORAL
  Filled 2021-10-22 (×28): qty 1

## 2021-10-22 MED ORDER — METOCLOPRAMIDE HCL 5 MG/ML IJ SOLN
5.0000 mg | Freq: Three times a day (TID) | INTRAMUSCULAR | Status: DC
  Administered 2021-10-22 – 2021-10-24 (×8): 5 mg via INTRAVENOUS
  Filled 2021-10-22: qty 2
  Filled 2021-10-22: qty 1
  Filled 2021-10-22 (×6): qty 2

## 2021-10-22 MED ORDER — HYDRALAZINE HCL 50 MG PO TABS
75.0000 mg | ORAL_TABLET | Freq: Three times a day (TID) | ORAL | Status: DC
  Administered 2021-10-22 – 2021-10-25 (×9): 75 mg via ORAL
  Filled 2021-10-22 (×9): qty 1

## 2021-10-22 MED ORDER — SALINE SPRAY 0.65 % NA SOLN
1.0000 | NASAL | Status: DC | PRN
  Administered 2021-10-22: 1 via NASAL
  Filled 2021-10-22: qty 44

## 2021-10-22 NOTE — TOC Progression Note (Addendum)
Transition of Care [facility]) - Progression Note    Patient Details  Name: Maureen Jones MRN: [id number] Date of Birth: 07-Jul-1966  Transition of [facility]) CM/SW Contact  [name], [name], RN Phone Number: 10/22/2021, 3:06 PM  Clinical Narrative:    CM received a secure chat message from Clemens Catholic, [place] that patient's friend she is staying with is not reliable and patient is not eligible for CIR as there will not be a 24/hr supervision after rehab. Recommending SNF/ LTACH.  CM called and spoke with [name] [phone]) about patient's discharge disposition. [name] states patient has been living with her since her husband and son died. They are not biological sisters but they consider themselves as sisters. States patient's father is in [place] and does not support patient. Has not called to check on patient since she was admitted in the hospital. States patient wants to go home and has expressed that to her. CM explained to her that patient is a new trach patient and gets fatigue easily and would need some more therapy before going home. [name] voiced understanding, agreed to fax patient out but wants to speak with patient on Monday with CM in room. Appointment scheduled for 11 am. CM notified Anderson Malta with [facility] as she has been looking at patient to see if she is a potential candidate. CM also notified Janett Billow with [facility] and patient faxed through the [facility] to [facility]. Awaiting response. CM will follow with needs.    Expected Discharge Plan: [place] Barriers to Discharge: Continued Medical Work up  Expected Discharge Plan and Services Expected Discharge Plan: [place]   Discharge Planning Services: CM Consult Post Acute Care Choice: [place] arrangements for the past 2 months: Mobile Home                           HH Arranged: PT, OT, Speech Therapy HH Agency: [place] Date [place]: 09/29/21 Time [place]: 1620 Representative spoke with at [place]: [place] ([place]) Interventions    Readmission Risk Interventions No flowsheet data found.

## 2021-10-22 NOTE — NC FL2 (Signed)
[place] MEDICAID FL2 LEVEL OF CARE SCREENING TOOL     IDENTIFICATION  Patient Name: Maureen Jones Birthdate: 10/20/1966 Sex: female Admission Date (Current Location): 09/20/2021  County and Medicaid Number:  [place]   Facility and Address:  [facility], [address], [place], [place] [address]      Provider Number: [id number]  Attending Physician Name and Address:  Krishnan, Gokul, MD  Relative Name and Phone Number:  [name] Beasley ([phone])    Current Level of Care: Hospital Recommended Level of Care: Skilled Nursing Facility Prior Approval Number:    Date Approved/Denied:   PASRR Number: [id number]  Discharge Plan: SNF    Current Diagnoses: Patient Active Problem List   Diagnosis Date Noted   Subglottic edema    Vocal cord dysfunction    Acute respiratory failure (HCC)    Endotracheal tube present    Encephalopathy acute 09/20/2021    Orientation RESPIRATION BLADDER Height & Weight     Self, Situation, Place  Tracheostomy Incontinent Weight: 101.6 kg Height:  5' 6" (167.6 cm)  BEHAVIORAL SYMPTOMS/MOOD NEUROLOGICAL BOWEL NUTRITION STATUS      Continent Diet  AMBULATORY STATUS COMMUNICATION OF NEEDS Skin   Limited Assist Verbally (Trach) Other (Comment) (Rt arm abraision, ecchymosis to abdomen.)                       Personal Care Assistance Level of Assistance  Bathing, Feeding, Dressing, Total care Bathing Assistance: Limited assistance Feeding assistance: Limited assistance Dressing Assistance: Limited assistance Total Care Assistance: Limited assistance   Functional Limitations Info  Sight, Speech, Hearing Sight Info: Adequate Hearing Info: Adequate Speech Info: Impaired (Trach)    SPECIAL CARE FACTORS FREQUENCY  PT (By licensed PT), OT (By licensed OT)     PT Frequency: 5 x/week OT Frequency: 5 x/week            Contractures Contractures Info: Not present    Additional Factors Info  Code  Status, Psychotropic, Allergies Code Status Info: Partial Allergies Info: No Known Drug Allergy Psychotropic Info: Klonopin 1 mg twice daily, Seroquel 50 mg twice daily, Cymbalta 60 mg daily.         Current Medications (10/22/2021):  This is the current hospital active medication list Current Facility-Administered Medications  Medication Dose Route Frequency Provider Last Rate Last Admin   0.9 %  sodium chloride infusion   Intravenous PRN Olalere, Adewale A, MD   Stopped at 10/21/21 0722   acetaminophen (TYLENOL) suppository 650 mg  650 mg Rectal Q4H PRN Yap, Vanessa, MD   650 mg at 10/14/21 0225   acetaminophen (TYLENOL) tablet 650 mg  650 mg Per Tube Q4H PRN Eubanks, Katalina M, NP   650 mg at 10/18/21 0402   albuterol (PROVENTIL) (2.5 MG/3ML) 0.083% nebulizer solution 2.5 mg  2.5 mg Nebulization Q4H PRN Smith, Daniel C, MD   2.5 mg at 10/22/21 0023   arformoterol (BROVANA) nebulizer solution 15 mcg  15 mcg Nebulization BID Smith, Daniel C, MD   15 mcg at 10/22/21 0811   aspirin chewable tablet 81 mg  81 mg Oral Daily Olalere, Adewale A, MD   81 mg at 10/22/21 0928   budesonide (PULMICORT) nebulizer solution 0.25 mg  0.25 mg Nebulization BID Smith, Daniel C, MD   0.25 mg at 10/22/21 0813   carvedilol (COREG) tablet 6.25 mg  6.25 mg Oral BID Olalere, Adewale A, MD   6.25 mg at 10/22/21 1557   chlorhexidine   gluconate (MEDLINE KIT) (PERIDEX) 0.12 % solution 15 mL  15 mL Mouth Rinse BID Candee Furbish, MD   15 mL at 10/22/21 1204   Chlorhexidine Gluconate Cloth 2 % PADS 6 each  6 each Topical Daily Omar Person, NP   6 each at 10/22/21 1000   clonazePAM (KLONOPIN) tablet 0.5 mg  0.5 mg Oral TID PRN Sherrilyn Rist A, MD       clonazePAM (KLONOPIN) tablet 1 mg  1 mg Oral BID Olalere, Adewale A, MD   1 mg at 10/22/21 0929   dextrose 50 % solution 0-50 mL  0-50 mL Intravenous PRN Omar Person, NP   50 mL at 10/13/21 2352   docusate sodium (COLACE) capsule 100 mg  100 mg Oral BID  PRN Olalere, Adewale A, MD       DULoxetine (CYMBALTA) DR capsule 60 mg  60 mg Oral Daily Bonnielee Haff, MD   60 mg at 10/22/21 0928   fluticasone (FLONASE) 50 MCG/ACT nasal spray 2 spray  2 spray Each Nare Daily Candee Furbish, MD   2 spray at 10/22/21 0931   guaiFENesin (ROBITUSSIN) 100 MG/5ML liquid 5 mL  5 mL Oral Q4H PRN Olalere, Adewale A, MD       heparin injection 5,000 Units  5,000 Units Subcutaneous Q8H Estill Cotta, NP   5,000 Units at 10/22/21 1343   hydrALAZINE (APRESOLINE) injection 10 mg  10 mg Intravenous Q4H PRN Omar Person, NP   10 mg at 10/22/21 0941   hydrALAZINE (APRESOLINE) tablet 75 mg  75 mg Oral Q8H Bonnielee Haff, MD   75 mg at 10/22/21 1343   insulin aspart (novoLOG) injection 0-20 Units  0-20 Units Subcutaneous Q4H Estill Cotta, NP   3 Units at 10/22/21 1204   insulin detemir (LEVEMIR) injection 20 Units  20 Units Subcutaneous BID Frederik Pear, MD   20 Units at 10/22/21 0936   isosorbide dinitrate (ISORDIL) tablet 10 mg  10 mg Per Tube BID Jacky Kindle, MD   10 mg at 10/22/21 0935   lidocaine (LIDODERM) 5 % 2 patch  2 patch Transdermal Q24H Candee Furbish, MD   2 patch at 10/22/21 1556   loratadine (CLARITIN) tablet 10 mg  10 mg Per Tube Daily Omar Person, NP   10 mg at 10/22/21 8315   MEDLINE mouth rinse  15 mL Mouth Rinse 10 times per day Candee Furbish, MD   15 mL at 10/22/21 1558   metoCLOPramide (REGLAN) injection 5 mg  5 mg Intravenous TID Bonnielee Haff, MD   5 mg at 10/22/21 1343   montelukast (SINGULAIR) tablet 10 mg  10 mg Oral QHS Olalere, Adewale A, MD   10 mg at 10/21/21 2142   nitroGLYCERIN (NITROSTAT) SL tablet 0.4 mg  0.4 mg Sublingual Q5 min PRN Anders Simmonds, MD       nystatin (MYCOSTATIN) 100000 UNIT/ML suspension 500,000 Units  5 mL Oral QID Candee Furbish, MD   500,000 Units at 10/22/21 1343   ondansetron (ZOFRAN) injection 4 mg  4 mg Intravenous Q6H PRN Omar Person, NP   4 mg at 10/22/21 0756    pantoprazole (PROTONIX) EC tablet 40 mg  40 mg Oral BID Olalere, Adewale A, MD   40 mg at 10/22/21 0924   phenol (CHLORASEPTIC) mouth spray 1 spray  1 spray Mouth/Throat PRN Omar Person, NP   1 spray at 10/07/21 2020   polyethylene glycol (MIRALAX /  GLYCOLAX) packet 17 g  17 g Oral Daily PRN Olalere, Adewale A, MD       prochlorperazine (COMPAZINE) injection 5 mg  5 mg Intravenous Q6H PRN Frederik Pear, MD   5 mg at 10/21/21 2114   QUEtiapine (SEROQUEL) tablet 50 mg  50 mg Oral BID Olalere, Adewale A, MD   50 mg at 10/22/21 0923   sodium chloride (OCEAN) 0.65 % nasal spray 1 spray  1 spray Each Nare PRN Bonnielee Haff, MD   1 spray at 10/22/21 1557   sodium chloride flush (NS) 0.9 % injection 10-40 mL  10-40 mL Intracatheter Q12H Omar Person, NP   10 mL at 10/22/21 1205     Discharge Medications: Please see discharge summary for a list of discharge medications.  Relevant Imaging Results:  Relevant Lab Results:   Additional Information SSN# [id number]  [name], [name], RN

## 2021-10-22 NOTE — Progress Notes (Signed)
Physical Therapy Treatment Patient Details Name: Maureen Jones MRN: [id number] DOB: 07/27/1966 Today's Date: 10/22/2021   History of Present Illness 55 y.o. female admitted 09/20/21 with AMS. 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 CXR with low lung volumes, L>R interstitial opacities. Acute metabolic encephalopathy in setting of HTN urgency, possible PRES. ETT (10/10-10/12, 10/12-10/14). Course complicated by confusion/agitation overnight 10/17. 10/25 trach and bronch with return to vent.  11/2 pt passed out while returning from the bathroom pt with respiratory distress from disloged trach leading to PEA arrest with CPR x 10 min. ETT via stoma.  Trach revised 11/2.  PMHx:HTN, DM.    PT Comments    Continues to make steady progress with mobility. Her functional activity tolerance allows her to participate in therapy but expect she will need to have more tolerance to be able to manage a return to home. Updated dc recommendation to inpatient rehab so pt can be successful in return home after extended hospitalization.    Recommendations for follow up therapy are one component of a multi-disciplinary discharge planning process, led by the attending physician.  Recommendations may be updated based on patient status, additional functional criteria and insurance authorization.  Follow Up Recommendations  Acute inpatient rehab (3hours/day)     Assistance Recommended at Discharge Intermittent Supervision/Assistance  Equipment Recommendations  Rolling walker (2 wheels);BSC/3in1    Recommendations for Other Services       Precautions / Restrictions Precautions Precautions: Fall;Other (comment) Precaution Comments: trach Restrictions Weight Bearing Restrictions: No     Mobility  Bed Mobility               General bed mobility comments: Pt up in chair    Transfers Overall transfer level: Needs assistance Equipment used: Rolling walker (2 wheels) Transfers: Sit to/from  Stand Sit to Stand: Min guard     Step pivot transfers: Min guard     General transfer comment: Assist for safety and lines and verbal cues for hand placement    Ambulation/Gait Ambulation/Gait assistance: Min assist Gait Distance (Feet): 150 Feet Assistive device: Rolling walker (2 wheels) Gait Pattern/deviations: Step-through pattern;Decreased stride length (episodes of posterior trunk sway) Gait velocity: Decreased Gait velocity interpretation: 1.31 - 2.62 ft/sec, indicative of limited community ambulator   General Gait Details: Assist for balance as pt with several episodes of posterior trunk sway. Amb on 45% FiO2 trach collar with SpO2 90-92%.   Stairs             Wheelchair Mobility    Modified Rankin (Stroke Patients Only)       Balance Overall balance assessment: Needs assistance Sitting-balance support: No upper extremity supported;Feet supported Sitting balance-[name]: Good     Standing balance support: Bilateral upper extremity supported;During functional activity;Reliant on assistive device for balance Standing balance-[name]: Poor Standing balance comment: walker and min guard for static standing                            Cognition Arousal/Alertness: Awake/alert Behavior During Therapy: Flat affect Overall Cognitive Status: Difficult to assess                                 General Comments: Follows 1 step commands. Cues for safety.        Exercises      General Comments General comments (skin integrity, edema, etc.): Amb on 45% FiO2 trach collar  with SpO2 90-92%. HR 120's with amb      Pertinent Vitals/Pain Pain Assessment: Faces Faces Pain Scale: No hurt Pain Location: chest Pain Descriptors / Indicators: Sore;Aching Pain Intervention(s): Limited activity within patient's tolerance;Monitored during session;Repositioned    Home Living                          Prior Function             PT Goals (current goals can now be found in the care plan section) Progress towards PT goals: Progressing toward goals    Frequency    Min 3X/week      PT Plan Discharge plan needs to be updated    Co-evaluation              AM-PAC PT "6 Clicks" Mobility   Outcome Measure  Help needed turning from your back to your side while in a flat bed without using bedrails?: None Help needed moving from lying on your back to sitting on the side of a flat bed without using bedrails?: A Little Help needed moving to and from a bed to a chair (including a wheelchair)?: A Little Help needed standing up from a chair using your arms (e.g., wheelchair or bedside chair)?: A Little Help needed to walk in hospital room?: A Little Help needed climbing 3-5 steps with a railing? : A Lot 6 Click Score: 18    End of Session Equipment Utilized During Treatment: Gait belt;Oxygen Activity Tolerance: Patient tolerated treatment well Patient left: in chair;with call bell/phone within reach;with chair alarm set Nurse Communication: Mobility status PT Visit Diagnosis: Other abnormalities of gait and mobility (R26.89);Difficulty in walking, not elsewhere classified (R26.2)     Time: 2202-5427 PT Time Calculation (min) (ACUTE ONLY): 16 min  Charges:  $Gait Training: 8-22 mins                     [place] Pager [phone] Office [place] 10/22/2021, 11:01 AM

## 2021-10-22 NOTE — Progress Notes (Signed)
[facility] PROGRESS NOTE   Maureen Jones VFI:[id number] DOB: 06/15/1966 DOA: 09/20/2021  PCP: Pcp, No  Brief History/Interval Summary: 55 y/o F who presented to [facility] on 10/10 with reports of acute altered mental status secondary to hypertensive emergency with hypoxic respiratory failure related to VCD/post intubation subglottic and glottic edema/vocal hoarseness  +/- asthmatic exacerbation.  Ultimately required trach placement. Transferred back to ICU on 11/2 after trach became dislodged on floor, resulting in PEA arrest with 10 min CPR and 58m epi. ROSC obtained. Trach revised to 6.0 XLT.   Reason for Visit: Acute respiratory failure with hypoxia  Consultants: Critical care medicine.  Significant Hospital Events: Including procedures, antibiotic start and stop dates in addition to other pertinent events   10/10 Admit with acute encephalopathy, hypertensive 10/12 extubated, increased WOB, re-intubated 10/14 extubated , lingering stridor, Received racemic epi x2 over the last 24 hours + another dose of Decadron, Mild agitation requiring restart of Precedex 10/15 swallow evaluation >> dysphagia 3 diet 10/20 decompensated after switch to PO steroids 10/21 better after CAT and back on IV steroids 10/22 agitated back on  BIPAP 10/25 stridor again. Marked work of breathing. Intubated, airway noises, stridor and wheezing completely resolved post intubation. Spoke to ENT who will follow after trach. Trach placed by Dr MVaughan Browner Seroquel increased to 100 qhs, changes solumedrol to prednisone to begin taper, rested on propofol over night. Tubefeeds started. RUE PICC placed  10/26 prop weaning off attempting ATC  11/2 PEA arrest in setting of dislodged tracheostomy. questionable syncope.  Total 10 minutes CPR. Transferred to intensive care unit intubated via trach stoma for redo trach 11/2/202.  Redo tracheostomy. 6 XLT. 11/4 Concern overnight for STEMI, hep gtt started, cards consulted;  this was artifact 11/5 changed out distal XLT for proximal due to positioning within trachea 11/8 tolerated trach collar for about 6 hours 11/7, placed back on vent      Subjective/Interval History: Patient mentions that she is again feeling nauseated and had episodes of vomiting overnight.  Denies any chest pain shortness of breath.  Denies any abdominal pain.        Assessment/Plan:  Nausea and vomiting likely gastroparesis Abdominal x-ray did not show any obstruction.  Abdomen was fairly benign on examination.  Lipase level as well as LFTs were normal.  Patient seemed to have improved with Reglan which was changed over to as needed yesterday.  Had recurrence of symptoms overnight.  She has diabetes this is likely diabetic gastroparesis.  We will change her back to scheduled Reglan and monitor for now.  PPI dose was also increased.    Acute respiratory failure with hypoxia with history of vocal cord dysfunction/subglottic and glottic edema She is status post tracheostomy.  She had dislodgment of his tracheostomy on 11/2 leading to PEA arrest.  She required CPR for 10 minutes following which she had ROSC. Critical care medicine is following.   Has not require any ventilator support for the last 48 hours.    Healthcare associated pneumonia Completed 7 days of antibiotics.  Stable.  Agitation Seems to be mentating better.  Noted to be on clonazepam and Seroquel.  Discussed with critical care medicine yesterday.  Dose of Seroquel and Klonopin was decreased.  We will continue to wean it down over several days.    Essential hypertension Noted to be on hydralazine, nitrates, carvedilol.   Blood pressure remains poorly controlled.  We will change hydralazine to 3 times a day.    Diabetes mellitus type  2, uncontrolled with hyperglycemia HbA1c 12.7.  CBGs are reasonably well controlled.   Dose of Levemir was decreased as patient was taken off of tube feedings.  Continue to monitor CBGs.   SSI.     Chronic kidney disease stage IIIb Presented with elevated creatinine.  No old labs in our system.  Care everywhere was reviewed.  She had a creatinine of 1.7 last year.  Likely has chronic kidney disease.  Renal function stable for the most part.  Avoid nephrotoxic agents. Monitor urine output.  Recheck labs tomorrow.  Normocytic anemia Likely anemia of critical illness.  No evidence of overt bleeding.  Anemia panel does not show any clear-cut deficiencies.  Hypomagnesemia and hypophosphatemia Pharmacy is addressing.    Dysphagia Secondary to tracheostomy.  See below.  Nutrition She is now no longer on tube feedings.  Speech therapy is following.  She is on carb modified diet with nectar thick liquids.  Obesity Estimated body mass index is 36.15 kg/m as calculated from the following:   Height as of this encounter: 5' 6" (1.676 m).   Weight as of this encounter: 101.6 kg.    DVT Prophylaxis: Subcutaneous heparin Code Status: Partial code Family Communication: Discussed with patient. Disposition Plan: It appears that she is being considered for inpatient rehabilitation.    Status is: Inpatient  Remains inpatient appropriate because: Acute respiratory failure requiring mechanical ventilation on and off      Medications: Scheduled:  arformoterol  15 mcg Nebulization BID   aspirin  81 mg Oral Daily   budesonide (PULMICORT) nebulizer solution  0.25 mg Nebulization BID   carvedilol  6.25 mg Oral BID   chlorhexidine gluconate (MEDLINE KIT)  15 mL Mouth Rinse BID   Chlorhexidine Gluconate Cloth  6 each Topical Daily   clonazePAM  1 mg Oral BID   FLUoxetine  20 mg Oral Daily   fluticasone  2 spray Each Nare Daily   heparin injection (subcutaneous)  5,000 Units Subcutaneous Q8H   hydrALAZINE  75 mg Oral Q8H   insulin aspart  0-20 Units Subcutaneous Q4H   insulin detemir  20 Units Subcutaneous BID   isosorbide dinitrate  10 mg Per Tube BID   lidocaine  2 patch  Transdermal Q24H   loratadine  10 mg Per Tube Daily   mouth rinse  15 mL Mouth Rinse 10 times per day   metoCLOPramide (REGLAN) injection  5 mg Intravenous TID   montelukast  10 mg Oral QHS   nystatin  5 mL Oral QID   pantoprazole  40 mg Oral BID   QUEtiapine  50 mg Oral BID   sodium chloride flush  10-40 mL Intracatheter Q12H   Continuous:  sodium chloride Stopped (10/21/21 0722)   BTD:HRCBUL chloride, acetaminophen, acetaminophen, albuterol, clonazePAM, dextrose, docusate sodium, guaiFENesin, hydrALAZINE, nitroGLYCERIN, ondansetron (ZOFRAN) IV, phenol, polyethylene glycol, prochlorperazine  Antibiotics: Anti-infectives (From admission, onward)    Start     Dose/Rate Route Frequency Ordered Stop   10/18/21 0830  ceFEPIme (MAXIPIME) 2 g in sodium chloride 0.9 % 100 mL IVPB  Status:  Discontinued        2 g 200 mL/hr over 30 Minutes Intravenous Every 24 hours 10/17/21 1055 10/17/21 1056   10/17/21 2200  ceFEPIme (MAXIPIME) 2 g in sodium chloride 0.9 % 100 mL IVPB        2 g 200 mL/hr over 30 Minutes Intravenous Every 12 hours 10/17/21 1056 10/18/21 2145   10/14/21 0830  ceFEPIme (MAXIPIME) 2 g in  sodium chloride 0.9 % 100 mL IVPB  Status:  Discontinued        2 g 200 mL/hr over 30 Minutes Intravenous Every 24 hours 10/14/21 0721 10/17/21 1055   10/14/21 0215  piperacillin-tazobactam (ZOSYN) IVPB 3.375 g        3.375 g 100 mL/hr over 30 Minutes Intravenous  Once 10/14/21 0126 10/14/21 0302   10/04/21 1100  vancomycin (VANCOREADY) IVPB 1250 mg/250 mL  Status:  Discontinued        1,250 mg 166.7 mL/hr over 90 Minutes Intravenous Every 48 hours 10/02/21 0939 10/03/21 1508   10/02/21 2200  ceFEPIme (MAXIPIME) 2 g in sodium chloride 0.9 % 100 mL IVPB  Status:  Discontinued        2 g 200 mL/hr over 30 Minutes Intravenous Every 12 hours 10/02/21 0948 10/04/21 1010   10/02/21 1030  vancomycin (VANCOREADY) IVPB 2000 mg/400 mL        2,000 mg 200 mL/hr over 120 Minutes Intravenous  Once  10/02/21 0939 10/02/21 1324   09/27/21 0900  ceFEPIme (MAXIPIME) 2 g in sodium chloride 0.9 % 100 mL IVPB  Status:  Discontinued        2 g 200 mL/hr over 30 Minutes Intravenous Every 12 hours 09/27/21 0800 10/02/21 0948   09/27/21 0900  vancomycin (VANCOREADY) IVPB 1500 mg/300 mL  Status:  Discontinued        1,500 mg 150 mL/hr over 120 Minutes Intravenous Every 48 hours 09/27/21 0800 09/28/21 1058   09/21/21 0800  cefTRIAXone (ROCEPHIN) 2 g in sodium chloride 0.9 % 100 mL IVPB        2 g 200 mL/hr over 30 Minutes Intravenous Every 24 hours 09/21/21 0742 09/25/21 0808   09/20/21 0945  cefTRIAXone (ROCEPHIN) 2 g in sodium chloride 0.9 % 100 mL IVPB  Status:  Discontinued        2 g 200 mL/hr over 30 Minutes Intravenous Every 24 hours 09/20/21 0936 09/21/21 0742       Objective:  Vital Signs  Vitals:   10/22/21 0713 10/22/21 0811 10/22/21 0813 10/22/21 0814  BP:      Pulse:    94  Resp:    (!) 23  Temp: 98.5 F (36.9 C)     TempSrc: Oral     SpO2:  92% 94% 94%  Weight:      Height:        Intake/Output Summary (Last 24 hours) at 10/22/2021 0825 Last data filed at 10/22/2021 0600 Gross per 24 hour  Intake 489.77 ml  Output 1400 ml  Net -910.23 ml    Filed Weights   10/18/21 0407 10/19/21 0327 10/21/21 0459  Weight: 100.9 kg 99.1 kg 101.6 kg    General appearance: Awake alert.  In no distress Tracheostomy is noted Resp: Clear to auscultation bilaterally.  Normal effort Cardio: S1-S2 is normal regular.  No S3-S4.  No rubs murmurs or bruit GI: Abdomen is soft.  Mildly tender in the epigastric area without any rebound rigidity or guarding.  No masses organomegaly.  Bowel sounds present normal. Extremities: Moving all 4 extremities Neurologic: Alert and oriented x3.  No focal neurological deficits.      Lab Results:  Data Reviewed: I have personally reviewed following labs and imaging studies  CBC: Recent Labs  Lab 10/17/21 0359 10/18/21 0405 10/19/21 0319  10/20/21 0411 10/21/21 0506  WBC 9.8 11.3* 10.2 12.0* 11.2*  NEUTROABS  --   --   --  8.9*  --  HGB 7.2* 7.3* 7.0* 7.9* 7.7*  HCT 24.9* 25.2* 24.4* 27.3* 26.9*  MCV 97.3 98.8 100.0 98.6 98.5  PLT 251 283 279 330 363     Basic Metabolic Panel: Recent Labs  Lab 10/15/21 1700 10/15/21 1700 10/16/21 0408 10/17/21 0359 10/18/21 0405 10/19/21 0319 10/20/21 0411 10/20/21 2040 10/21/21 0506  NA  --    < > 140 141 144 147* 145  --  142  K  --    < > 4.4 4.7 4.7 4.7 4.6  --  4.0  CL  --    < > 106 109 111 113* 109  --  107  CO2  --    < > _0 --  28  GLUCOSE  --    < > 162* 298* 247* 189* 197*  --  147*  BUN  --    < > 50* 47* 42* 41* 36*  --  34*  CREATININE  --    < > 2.44* 2.15* 2.00* 1.99* 1.79*  --  2.00*  CALCIUM  --    < > 9.3 9.5 9.7 9.9 10.1  --  10.0  MG 2.1  --  2.1 2.1  --   --  1.6* 2.5* 2.3  PHOS 3.9  --  3.4  --   --   --  2.3*  --  3.6   < > = values in this interval not displayed.     GFR: Estimated Creatinine Clearance: 38.7 mL/min (A) (by C-G formula based on SCr of 2 mg/dL (H)).  Liver Function Tests: Recent Labs  Lab 10/20/21 0411 10/21/21 0506  AST 13* 13*  ALT 23 21  ALKPHOS 125 129*  BILITOT 0.7 0.3  PROT 6.7 6.2*  ALBUMIN 1.9* 1.9*     Recent Labs  Lab 10/20/21 0411  LIPASE 47     CBG: Recent Labs  Lab 10/21/21 1524 10/21/21 1942 10/21/21 2349 10/22/21 0351 10/22/21 0711  GLUCAP 87 143* 131* 120* 144*      Recent Results (from the past 240 hour(s))  Expectorated Sputum Assessment w Gram Stain, Rflx to Resp Cult     Status: None   Collection Time: 10/12/21  1:59 PM   Specimen: Expectorated Sputum  Result Value Ref Range Status   Specimen Description EXPECTORATED SPUTUM  Final   Special Requests NONE  Final   Sputum evaluation   Final    THIS SPECIMEN IS ACCEPTABLE FOR SPUTUM CULTURE Performed at [facility], [place] [address] [place], [place] [address]    Report Status 10/12/2021 FINAL  Final   Culture, Respiratory w Gram Stain     Status: None   Collection Time: 10/12/21  1:59 PM  Result Value Ref Range Status   Specimen Description EXPECTORATED SPUTUM  Final   Special Requests NONE Reflexed from [id number]  Final   Gram Stain   Final    FEW WBC PRESENT, PREDOMINANTLY PMN FEW GRAM NEGATIVE RODS FEW GRAM POSITIVE COCCI IN PAIRS AND CHAINS    Culture   Final    Normal respiratory flora-no Staph aureus or Pseudomonas seen Performed at [facility], [address] [place], [place] [address]    Report Status 10/15/2021 FINAL  Final  Culture, Respiratory w Gram Stain     Status: None   Collection Time: 10/14/21  9:09 AM   Specimen: Tracheal Aspirate; Respiratory  Result Value Ref Range Status   Specimen Description TRACHEAL ASPIRATE  Final  Special Requests NONE  Final   Gram Stain   Final    MODERATE WBC PRESENT,BOTH PMN AND MONONUCLEAR FEW GRAM POSITIVE COCCI IN PAIRS RARE GRAM NEGATIVE RODS    Culture   Final    FEW Normal respiratory flora-no Staph aureus or Pseudomonas seen Performed at [facility], [address] [place], [place] [address]    Report Status 10/16/2021 FINAL  Final  MRSA Next Gen by PCR, Nasal     Status: None   Collection Time: 10/14/21 10:48 AM   Specimen: Nasal Mucosa; Nasal Swab  Result Value Ref Range Status   MRSA by PCR Next Gen NOT DETECTED NOT DETECTED Final    Comment: (NOTE) The GeneXpert MRSA Assay (FDA approved for NASAL specimens only), is one component of a comprehensive MRSA colonization surveillance program. It is not intended to diagnose MRSA infection nor to guide or monitor treatment for MRSA infections. Test performance is not FDA approved in patients less than 11 years old. Performed at [facility], [place] [address], [place], [place] [address]   Culture, Respiratory w Gram Stain     Status: None   Collection Time: 10/16/21 12:33 PM   Specimen: Tracheal Aspirate; Respiratory  Result Value Ref Range  Status   Specimen Description TRACHEAL ASPIRATE  Final   Special Requests NONE  Final   Gram Stain   Final    FEW SQUAMOUS EPITHELIAL CELLS PRESENT MODERATE WBC SEEN FEW GRAM POSITIVE COCCI    Culture   Final    RARE Normal respiratory flora-no Staph aureus or Pseudomonas seen Performed at [facility], [address], [place], [place] [address]    Report Status 10/18/2021 FINAL  Final       Radiology Studies: DG Abd Portable 1V  Result Date: 10/20/2021 CLINICAL DATA:  Abdominal distension EXAM: PORTABLE ABDOMEN - 1 VIEW COMPARISON:  Abdominal x-ray 10/16/2019 FINDINGS: Enteric tube is been removed since previous study. Overall paucity of bowel gas with no distended gas-filled loops of bowel identified to suggest obstruction. There is residual oral contrast seen in the colon. IMPRESSION: No definite acute process identified. Electronically Signed   By: Ofilia Neas M.D.   On: 10/20/2021 12:15       LOS: 40 days   Amante Fomby [employer] on [email or web address]  10/22/2021, 8:25 AM

## 2021-10-22 NOTE — Progress Notes (Signed)
Inpatient Rehab Admissions Coordinator.    CIR consult received. I spoke with pt. And family and while they state interest, family is not able to provide 24/7 support at discharge which Pt. Is likely to need after CIR. I will not pursue CIR admit for this Pt., recommend that [facility] pursue other rehab venues.   Clemens Catholic, [place], [place] Admissions Coordinator  [phone] ([place]) [phone] (office)

## 2021-10-22 NOTE — Progress Notes (Signed)
Occupational Therapy Treatment Patient Details Name: Maureen Jones MRN: [id number] DOB: [birth year] Today's Date: 10/22/2021   History of present illness 55 y.o. female admitted 09/20/21 with AMS. 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 CXR with low lung volumes, L>R interstitial opacities. Acute metabolic encephalopathy in setting of HTN urgency, possible PRES. ETT (10/10-10/12, 10/12-10/14). Course complicated by confusion/agitation overnight 10/17. 10/25 trach and bronch with return to vent.  11/2 pt passed out while returning from the bathroom pt with respiratory distress from disloged trach leading to PEA arrest with CPR x 10 min. ETT via stoma.  Trach revised 11/2.  PMHx:HTN, DM.   OT comments  Patient seated in recliner upon entry,  Engaged in OT session.  Completing grooming standing at sink (sitting when fatigued) with min assist, while standing pt voices "I'm peeing".  Transferred to [facility] with min assist and completing toileting with mod assist for clothing mgmt.  When questioned awareness of urination, pt voicing she was aware when it started to happen.  Patient completing LB dressing with mod assist.  Relies on UE support in standing and requires external support without UE support during Adls.  She is highly motivated and believe CIR will best benefit her independence and tolerance for daily activities.    Recommendations for follow up therapy are one component of a multi-disciplinary discharge planning process, led by the attending physician.  Recommendations may be updated based on patient status, additional functional criteria and insurance authorization.    Follow Up Recommendations  Acute inpatient rehab (3hours/day)    Assistance Recommended at Discharge Frequent or constant Supervision/Assistance  Equipment Recommendations  BSC/3in1    Recommendations for Other Services      Precautions / Restrictions Precautions Precautions: Fall Precaution Comments: trach,  picc Restrictions Weight Bearing Restrictions: No       Mobility Bed Mobility               General bed mobility comments: OOB in recliner with NT    Transfers Overall transfer level: Needs assistance Equipment used: Rolling walker (2 wheels) Transfers: Sit to/from Stand Sit to Stand: Min assist   Step pivot transfers: Min guard       General transfer comment: min assist to power up and steady from recliner     Balance Overall balance assessment: Needs assistance Sitting-balance support: No upper extremity supported;Feet supported Sitting balance-[name]: Good     Standing balance support: Bilateral upper extremity supported;During functional activity Standing balance-[name]: Poor Standing balance comment: relies on UE support or external support                           ADL either performed or assessed with clinical judgement   ADL Overall ADL's : Needs assistance/impaired     Grooming: Minimal assistance;Sitting;Standing Grooming Details (indicate cue type and reason): min assist to stand dynamically during grooming tasks, fatigue easily and complete tasks sitting         Upper Body Dressing : Minimal assistance;Sitting Upper Body Dressing Details (indicate cue type and reason): to change gown Lower Body Dressing: Moderate assistance;Sit to/from stand Lower Body Dressing Details (indicate cue type and reason): requires assist to pull mesh underwear up and maintain balance without UE support Toilet Transfer: Minimal assistance;Stand-pivot;Rolling walker (2 wheels);BSC/3in1   Toileting- Clothing Manipulation and Hygiene: Moderate assistance;Sit to/from stand Toileting - Clothing Manipulation Details (indicate cue type and reason): for clothing mgmt in standing     Functional mobility during  ADLs: Minimal assistance;Rolling walker (2 wheels) General ADL Comments: pt limited by decreased activity tolerance and endurance     Extremity/Trunk Assessment              Vision       Perception     Praxis      Cognition Arousal/Alertness: Awake/alert Behavior During Therapy: Flat affect Overall Cognitive Status: Difficult to assess                                 General Comments: pt following simple commands, slow processing and min cueing for safety/seqencing          Exercises     Shoulder Instructions       General Comments VSS on TC    Pertinent Vitals/ Pain       Pain Assessment: Faces Faces Pain Scale: Hurts little more Pain Location: chest Pain Descriptors / Indicators: Sore;Aching Pain Intervention(s): Limited activity within patient's tolerance;Monitored during session;Repositioned  Home Living                                          Prior Functioning/Environment              Frequency  Min 2X/week        Progress Toward Goals  OT Goals(current goals can now be found in the care plan section)  Progress towards OT goals: Progressing toward goals  Acute Rehab OT Goals OT Goal Formulation: With patient Time For Goal Achievement: 10/28/21 Potential to Achieve Goals: Good  Plan Discharge plan remains appropriate;Frequency remains appropriate    Co-evaluation                 AM-PAC OT "6 Clicks" Daily Activity     Outcome Measure   Help from another person eating meals?: A Lot Help from another person taking care of personal grooming?: A Little Help from another person toileting, which includes using toliet, bedpan, or urinal?: A Lot Help from another person bathing (including washing, rinsing, drying)?: A Lot Help from another person to put on and taking off regular upper body clothing?: A Little Help from another person to put on and taking off regular lower body clothing?: A Lot 6 Click Score: 14    End of Session Equipment Utilized During Treatment: Oxygen (TC)  OT Visit Diagnosis: Other abnormalities of gait  and mobility (R26.89);Other symptoms and signs involving cognitive function   Activity Tolerance Patient tolerated treatment well   Patient Left in chair;with call bell/phone within reach;Other (comment) (with PT)   Nurse Communication Mobility status        Time: [phone] OT Time Calculation (min): 27 min  Charges: OT General Charges $OT Visit: 1 Visit OT Treatments $Self Care/Home Management : 23-37 mins  [place] Pager [phone] Office [phone]   Delight Stare 10/22/2021, 10:19 AM

## 2021-10-23 DIAGNOSIS — J9601 Acute respiratory failure with hypoxia: Secondary | ICD-10-CM | POA: Diagnosis not present

## 2021-10-23 DIAGNOSIS — I10 Essential (primary) hypertension: Secondary | ICD-10-CM | POA: Diagnosis not present

## 2021-10-23 DIAGNOSIS — R112 Nausea with vomiting, unspecified: Secondary | ICD-10-CM | POA: Diagnosis not present

## 2021-10-23 LAB — COMPREHENSIVE METABOLIC PANEL
ALT: 16 U/L (ref 0–44)
AST: 14 U/L — ABNORMAL LOW (ref 15–41)
Albumin: 2.1 g/dL — ABNORMAL LOW (ref 3.5–5.0)
Alkaline Phosphatase: 109 U/L (ref 38–126)
Anion gap: 8 (ref 5–15)
BUN: 23 mg/dL — ABNORMAL HIGH (ref 6–20)
CO2: 27 mmol/L (ref 22–32)
Calcium: 9.8 mg/dL (ref 8.9–10.3)
Chloride: 103 mmol/L (ref 98–111)
Creatinine, Ser: 1.95 mg/dL — ABNORMAL HIGH (ref 0.44–1.00)
GFR, Estimated: 30 mL/min — ABNORMAL LOW (ref >60)
Glucose, Bld: 87 mg/dL (ref 70–99)
Potassium: 3.9 mmol/L (ref 3.5–5.1)
Sodium: 138 mmol/L (ref 135–145)
Total Bilirubin: 0.5 mg/dL (ref 0.3–1.2)
Total Protein: 5.9 g/dL — ABNORMAL LOW (ref 6.5–8.1)

## 2021-10-23 LAB — CBC
HCT: 26.7 % — ABNORMAL LOW (ref 36.0–46.0)
Hemoglobin: 8 g/dL — ABNORMAL LOW (ref 12.0–15.0)
MCH: 28.8 pg (ref 26.0–34.0)
MCHC: 30 g/dL (ref 30.0–36.0)
MCV: 96 fL (ref 80.0–100.0)
Platelets: 350 10*3/uL (ref 150–400)
RBC: 2.78 MIL/uL — ABNORMAL LOW (ref 3.87–5.11)
RDW: 14.6 % (ref 11.5–15.5)
WBC: 11.7 10*3/uL — ABNORMAL HIGH (ref 4.0–10.5)
nRBC: 0 % (ref 0.0–0.2)

## 2021-10-23 LAB — GLUCOSE, CAPILLARY
Glucose-Capillary: 79 mg/dL (ref 70–99)
Glucose-Capillary: 82 mg/dL (ref 70–99)
Glucose-Capillary: 90 mg/dL (ref 70–99)
Glucose-Capillary: 90 mg/dL (ref 70–99)
Glucose-Capillary: 99 mg/dL (ref 70–99)

## 2021-10-23 LAB — MAGNESIUM: Magnesium: 1.7 mg/dL (ref 1.7–2.4)

## 2021-10-23 MED ORDER — ORAL CARE MOUTH RINSE
15.0000 mL | Freq: Two times a day (BID) | OROMUCOSAL | Status: DC
  Administered 2021-10-24 – 2021-11-15 (×40): 15 mL via OROMUCOSAL

## 2021-10-23 MED ORDER — CARVEDILOL 12.5 MG PO TABS
12.5000 mg | ORAL_TABLET | Freq: Two times a day (BID) | ORAL | Status: DC
  Administered 2021-10-23 – 2021-11-04 (×25): 12.5 mg via ORAL
  Filled 2021-10-23 (×25): qty 1

## 2021-10-23 MED ORDER — ISOSORBIDE DINITRATE 10 MG PO TABS
10.0000 mg | ORAL_TABLET | Freq: Two times a day (BID) | ORAL | Status: DC
  Administered 2021-10-23 – 2021-11-04 (×25): 10 mg via ORAL
  Filled 2021-10-23 (×27): qty 1

## 2021-10-23 MED ORDER — CHLORHEXIDINE GLUCONATE 0.12 % MT SOLN
OROMUCOSAL | Status: AC
  Filled 2021-10-23: qty 15

## 2021-10-23 MED ORDER — INSULIN DETEMIR 100 UNIT/ML ~~LOC~~ SOLN
16.0000 [IU] | Freq: Two times a day (BID) | SUBCUTANEOUS | Status: DC
Start: 2021-10-23 — End: 2021-10-24
  Administered 2021-10-23 (×2): 16 [IU] via SUBCUTANEOUS
  Filled 2021-10-23 (×4): qty 0.16

## 2021-10-23 MED ORDER — LORATADINE 10 MG PO TABS
10.0000 mg | ORAL_TABLET | Freq: Every day | ORAL | Status: DC
  Administered 2021-10-23 – 2021-11-15 (×24): 10 mg via ORAL
  Filled 2021-10-23 (×24): qty 1

## 2021-10-23 MED ORDER — ACETAMINOPHEN 325 MG PO TABS: 650.0000 mg | ORAL_TABLET | ORAL | Status: DC | PRN

## 2021-10-23 MED ORDER — CHLORHEXIDINE GLUCONATE 0.12 % MT SOLN
15.0000 mL | Freq: Two times a day (BID) | OROMUCOSAL | Status: DC
  Administered 2021-10-23 – 2021-11-15 (×46): 15 mL via OROMUCOSAL
  Filled 2021-10-23 (×47): qty 15

## 2021-10-23 MED ORDER — LABETALOL HCL 5 MG/ML IV SOLN
20.0000 mg | Freq: Once | INTRAVENOUS | Status: AC
  Administered 2021-10-23: 20 mg via INTRAVENOUS
  Filled 2021-10-23: qty 4

## 2021-10-23 MED ORDER — INSULIN ASPART 100 UNIT/ML IJ SOLN
0.0000 [IU] | Freq: Every day | INTRAMUSCULAR | Status: DC
Start: 2021-10-23 — End: 2021-11-15
  Administered 2021-10-24 – 2021-10-27 (×3): 2 [IU] via SUBCUTANEOUS
  Administered 2021-10-28 – 2021-10-29 (×2): 3 [IU] via SUBCUTANEOUS
  Administered 2021-10-30: 5 [IU] via SUBCUTANEOUS
  Administered 2021-11-04: 3 [IU] via SUBCUTANEOUS
  Administered 2021-11-09: 2 [IU] via SUBCUTANEOUS

## 2021-10-23 MED ORDER — INSULIN ASPART 100 UNIT/ML IJ SOLN
0.0000 [IU] | Freq: Three times a day (TID) | INTRAMUSCULAR | Status: DC
Start: 2021-10-23 — End: 2021-11-15
  Administered 2021-10-24: 2 [IU] via SUBCUTANEOUS
  Administered 2021-10-24 – 2021-10-25 (×3): 3 [IU] via SUBCUTANEOUS
  Administered 2021-10-25: 2 [IU] via SUBCUTANEOUS
  Administered 2021-10-26: 8 [IU] via SUBCUTANEOUS
  Administered 2021-10-26 (×2): 3 [IU] via SUBCUTANEOUS
  Administered 2021-10-27 (×2): 8 [IU] via SUBCUTANEOUS
  Administered 2021-10-27 – 2021-10-28 (×2): 5 [IU] via SUBCUTANEOUS
  Administered 2021-10-28: 13:00:00 8 [IU] via SUBCUTANEOUS
  Administered 2021-10-28: 09:00:00 5 [IU] via SUBCUTANEOUS
  Administered 2021-10-29: 8 [IU] via SUBCUTANEOUS
  Administered 2021-10-29 – 2021-10-30 (×5): 5 [IU] via SUBCUTANEOUS
  Administered 2021-10-31: 2 [IU] via SUBCUTANEOUS
  Administered 2021-10-31: 3 [IU] via SUBCUTANEOUS
  Administered 2021-10-31: 2 [IU] via SUBCUTANEOUS
  Administered 2021-11-01 (×3): 3 [IU] via SUBCUTANEOUS
  Administered 2021-11-02: 2 [IU] via SUBCUTANEOUS
  Administered 2021-11-02 (×2): 3 [IU] via SUBCUTANEOUS
  Administered 2021-11-03: 2 [IU] via SUBCUTANEOUS
  Administered 2021-11-03 – 2021-11-04 (×4): 3 [IU] via SUBCUTANEOUS
  Administered 2021-11-04 – 2021-11-05 (×2): 5 [IU] via SUBCUTANEOUS
  Administered 2021-11-05 – 2021-11-06 (×3): 3 [IU] via SUBCUTANEOUS
  Administered 2021-11-06: 8 [IU] via SUBCUTANEOUS
  Administered 2021-11-07 (×2): 3 [IU] via SUBCUTANEOUS
  Administered 2021-11-08 (×2): 2 [IU] via SUBCUTANEOUS
  Administered 2021-11-08 – 2021-11-09 (×2): 5 [IU] via SUBCUTANEOUS
  Administered 2021-11-09 – 2021-11-10 (×2): 2 [IU] via SUBCUTANEOUS
  Administered 2021-11-10 (×2): 3 [IU] via SUBCUTANEOUS
  Administered 2021-11-11: 8 [IU] via SUBCUTANEOUS
  Administered 2021-11-11: 3 [IU] via SUBCUTANEOUS
  Administered 2021-11-11: 5 [IU] via SUBCUTANEOUS
  Administered 2021-11-12: 8 [IU] via SUBCUTANEOUS
  Administered 2021-11-12 (×2): 3 [IU] via SUBCUTANEOUS
  Administered 2021-11-13: 8 [IU] via SUBCUTANEOUS
  Administered 2021-11-13: 3 [IU] via SUBCUTANEOUS
  Administered 2021-11-13: 11 [IU] via SUBCUTANEOUS
  Administered 2021-11-14: 16:00:00 2 [IU] via SUBCUTANEOUS
  Administered 2021-11-14 – 2021-11-15 (×3): 3 [IU] via SUBCUTANEOUS
  Administered 2021-11-15: 2 [IU] via SUBCUTANEOUS

## 2021-10-23 NOTE — Progress Notes (Signed)
[facility] PROGRESS NOTE   Maureen Jones OYD:[id number] DOB: 07-Aug-1966 DOA: 09/20/2021  PCP: Pcp, No  Brief History/Interval Summary: 55 y/o F who presented to [facility] on 10/10 with reports of acute altered mental status secondary to hypertensive emergency with hypoxic respiratory failure related to VCD/post intubation subglottic and glottic edema/vocal hoarseness  +/- asthmatic exacerbation.  Ultimately required trach placement. Transferred back to ICU on 11/2 after trach became dislodged on floor, resulting in PEA arrest with 10 min CPR and 33m epi. ROSC obtained. Trach revised to 6.0 XLT.   Reason for Visit: Acute respiratory failure with hypoxia  Consultants: Critical care medicine.  Significant Hospital Events: Including procedures, antibiotic start and stop dates in addition to other pertinent events   10/10 Admit with acute encephalopathy, hypertensive 10/12 extubated, increased WOB, re-intubated 10/14 extubated , lingering stridor, Received racemic epi x2 over the last 24 hours + another dose of Decadron, Mild agitation requiring restart of Precedex 10/15 swallow evaluation >> dysphagia 3 diet 10/20 decompensated after switch to PO steroids 10/21 better after CAT and back on IV steroids 10/22 agitated back on  BIPAP 10/25 stridor again. Marked work of breathing. Intubated, airway noises, stridor and wheezing completely resolved post intubation. Spoke to ENT who will follow after trach. Trach placed by Dr MVaughan Browner Seroquel increased to 100 qhs, changes solumedrol to prednisone to begin taper, rested on propofol over night. Tubefeeds started. RUE PICC placed  10/26 prop weaning off attempting ATC  11/2 PEA arrest in setting of dislodged tracheostomy. questionable syncope.  Total 10 minutes CPR. Transferred to intensive care unit intubated via trach stoma for redo trach 11/2/202.  Redo tracheostomy. 6 XLT. 11/4 Concern overnight for STEMI, hep gtt started, cards consulted;  this was artifact 11/5 changed out distal XLT for proximal due to positioning within trachea 11/8 tolerated trach collar for about 6 hours 11/7, placed back on vent      Subjective/Interval History: Patient mentions that she feels better this morning.  Has not had any further episodes of nausea or vomiting.  Denies any abdominal pain.       Assessment/Plan:  Nausea and vomiting likely gastroparesis Abdominal x-ray did not show any obstruction.  Lipase level as well as LFTs were normal.   Symptoms suggesting diabetic gastroparesis.  Symptoms have improved with Reglan.  Continue scheduled intravenous Reglan for another day and will transition to oral tomorrow if she continues to show improvement. Continue PPI as well.    Acute respiratory failure with hypoxia with history of vocal cord dysfunction/subglottic and glottic edema She is status post tracheostomy.  She had dislodgment of his tracheostomy on 11/2 leading to PEA arrest.  She required CPR for 10 minutes following which she had ROSC. Critical care medicine is following periodically.   Has not require any ventilator support for the last 72 hours.  Stable on trach collar.  Healthcare associated pneumonia Completed 7 days of antibiotics.  Stable.  Agitation Seems to be mentating better.  Noted to be on clonazepam and Seroquel.  Doses are being gradually decreased.  We will continue to wean it down over several days.    Essential hypertension Noted to be on hydralazine, nitrates, carvedilol.   Despite increase in dose of hydralazine yesterday blood pressure remains poorly controlled.  We will increase the dose of her carvedilol.  Will consider going up on the dose of her hydralazine again tomorrow.  Diabetes mellitus type 2, uncontrolled with hyperglycemia HbA1c 12.7.  Continue Levemir and SSI.  Dose  of Levemir was decreased due to borderline low glucose levels.     Chronic kidney disease stage IIIb Presented with elevated  creatinine.  No old labs in our system.  Care everywhere was reviewed.  She had a creatinine of 1.7 last year.  Likely has chronic kidney disease.  Renal function stable for the most part.  Avoid nephrotoxic agents. Renal function is stable.  Continue to monitor urine output.    Normocytic anemia Likely anemia of critical illness.  No evidence of overt bleeding.  Anemia panel does not show any clear-cut deficiencies.  Hypomagnesemia and hypophosphatemia Addressed by pharmacy.  Improved.  Dysphagia/nutrition Secondary to tracheostomy.  Seen by speech therapy.  No longer tube feedings.  Tolerating her diet.  Obesity Estimated body mass index is 36.15 kg/m as calculated from the following:   Height as of this encounter: _0  (1.676 m).   Weight as of this encounter: 101.6 kg.    DVT Prophylaxis: Subcutaneous heparin Code Status: Partial code Family Communication: Discussed with patient. Disposition Plan: It appears that she is being considered for inpatient rehabilitation versus SNF.    Status is: Inpatient  Remains inpatient appropriate because: Acute respiratory failure requiring mechanical ventilation on and off      Medications: Scheduled:  arformoterol  15 mcg Nebulization BID   aspirin  81 mg Oral Daily   budesonide (PULMICORT) nebulizer solution  0.25 mg Nebulization BID   carvedilol  6.25 mg Oral BID   chlorhexidine gluconate (MEDLINE KIT)  15 mL Mouth Rinse BID   Chlorhexidine Gluconate Cloth  6 each Topical Daily   clonazePAM  1 mg Oral BID   DULoxetine  60 mg Oral Daily   fluticasone  2 spray Each Nare Daily   heparin injection (subcutaneous)  5,000 Units Subcutaneous Q8H   hydrALAZINE  75 mg Oral Q8H   insulin aspart  0-15 Units Subcutaneous TID WC   insulin aspart  0-5 Units Subcutaneous QHS   insulin detemir  16 Units Subcutaneous BID   isosorbide dinitrate  10 mg Oral BID   lidocaine  2 patch Transdermal Q24H   loratadine  10 mg Oral Daily   mouth rinse   15 mL Mouth Rinse 10 times per day   metoCLOPramide (REGLAN) injection  5 mg Intravenous TID   montelukast  10 mg Oral QHS   nystatin  5 mL Oral QID   pantoprazole  40 mg Oral BID   QUEtiapine  50 mg Oral BID   sodium chloride flush  10-40 mL Intracatheter Q12H   Continuous:  sodium chloride Stopped (10/21/21 0722)   FYT:WKMQKM chloride, acetaminophen, acetaminophen, albuterol, clonazePAM, dextrose, docusate sodium, guaiFENesin, hydrALAZINE, nitroGLYCERIN, ondansetron (ZOFRAN) IV, phenol, polyethylene glycol, prochlorperazine, sodium chloride  Antibiotics: Anti-infectives (From admission, onward)    Start     Dose/Rate Route Frequency Ordered Stop   10/18/21 0830  ceFEPIme (MAXIPIME) 2 g in sodium chloride 0.9 % 100 mL IVPB  Status:  Discontinued        2 g 200 mL/hr over 30 Minutes Intravenous Every 24 hours 10/17/21 1055 10/17/21 1056   10/17/21 2200  ceFEPIme (MAXIPIME) 2 g in sodium chloride 0.9 % 100 mL IVPB        2 g 200 mL/hr over 30 Minutes Intravenous Every 12 hours 10/17/21 1056 10/18/21 2145   10/14/21 0830  ceFEPIme (MAXIPIME) 2 g in sodium chloride 0.9 % 100 mL IVPB  Status:  Discontinued        2 g 200 mL/hr  over 30 Minutes Intravenous Every 24 hours 10/14/21 0721 10/17/21 1055   10/14/21 0215  piperacillin-tazobactam (ZOSYN) IVPB 3.375 g        3.375 g 100 mL/hr over 30 Minutes Intravenous  Once 10/14/21 0126 10/14/21 0302   10/04/21 1100  vancomycin (VANCOREADY) IVPB 1250 mg/250 mL  Status:  Discontinued        1,250 mg 166.7 mL/hr over 90 Minutes Intravenous Every 48 hours 10/02/21 0939 10/03/21 1508   10/02/21 2200  ceFEPIme (MAXIPIME) 2 g in sodium chloride 0.9 % 100 mL IVPB  Status:  Discontinued        2 g 200 mL/hr over 30 Minutes Intravenous Every 12 hours 10/02/21 0948 10/04/21 1010   10/02/21 1030  vancomycin (VANCOREADY) IVPB 2000 mg/400 mL        2,000 mg 200 mL/hr over 120 Minutes Intravenous  Once 10/02/21 0939 10/02/21 1324   09/27/21 0900   ceFEPIme (MAXIPIME) 2 g in sodium chloride 0.9 % 100 mL IVPB  Status:  Discontinued        2 g 200 mL/hr over 30 Minutes Intravenous Every 12 hours 09/27/21 0800 10/02/21 0948   09/27/21 0900  vancomycin (VANCOREADY) IVPB 1500 mg/300 mL  Status:  Discontinued        1,500 mg 150 mL/hr over 120 Minutes Intravenous Every 48 hours 09/27/21 0800 09/28/21 1058   09/21/21 0800  cefTRIAXone (ROCEPHIN) 2 g in sodium chloride 0.9 % 100 mL IVPB        2 g 200 mL/hr over 30 Minutes Intravenous Every 24 hours 09/21/21 0742 09/25/21 0808   09/20/21 0945  cefTRIAXone (ROCEPHIN) 2 g in sodium chloride 0.9 % 100 mL IVPB  Status:  Discontinued        2 g 200 mL/hr over 30 Minutes Intravenous Every 24 hours 09/20/21 0936 09/21/21 0742       Objective:  Vital Signs  Vitals:   10/23/21 0744 10/23/21 0751 10/23/21 0757 10/23/21 0800  BP: (!) 182/90   (!) 186/84  Pulse: 87   89  Resp: 20   (!) 22  Temp:   98.5 F (36.9 C) 98.5 F (36.9 C)  TempSrc:   Oral Oral  SpO2: 96% 96%  95%  Weight:      Height:        Intake/Output Summary (Last 24 hours) at 10/23/2021 0955 Last data filed at 10/23/2021 0000 Gross per 24 hour  Intake --  Output 850 ml  Net -850 ml    Filed Weights   10/18/21 0407 10/19/21 0327 10/21/21 0459  Weight: 100.9 kg 99.1 kg 101.6 kg    General appearance: Awake alert.  In no distress Tracheostomy is noted Resp: Diminished air entry at the bases.  No wheezing or rhonchi. Cardio: S1-S2 is normal regular.  No S3-S4.  No rubs murmurs or bruit GI: Abdomen is soft.  Nontender nondistended.  Bowel sounds are present normal.  No masses organomegaly Extremities: No edema.  Moving all of her extremities Neurologic: Alert and oriented x3.  No focal neurological deficits.      Lab Results:  Data Reviewed: I have personally reviewed following labs and imaging studies  CBC: Recent Labs  Lab 10/18/21 0405 10/19/21 0319 10/20/21 0411 10/21/21 0506 10/23/21 0145  WBC  11.3* 10.2 12.0* 11.2* 11.7*  NEUTROABS  --   --  8.9*  --   --   HGB 7.3* 7.0* 7.9* 7.7* 8.0*  HCT 25.2* 24.4* 27.3* 26.9* 26.7*  MCV 98.8  100.0 98.6 98.5 96.0  PLT 283 279 330 363 350     Basic Metabolic Panel: Recent Labs  Lab 10/17/21 0359 10/18/21 0405 10/19/21 0319 10/20/21 0411 10/20/21 2040 10/21/21 0506 10/23/21 0145  NA 141 144 147* 145  --  142 138  K 4.7 4.7 4.7 4.6  --  4.0 3.9  CL 109 111 113* 109  --  107 103  CO2 _0 --  28 27  GLUCOSE 298* 247* 189* 197*  --  147* 87  BUN 47* 42* 41* 36*  --  34* 23*  CREATININE 2.15* 2.00* 1.99* 1.79*  --  2.00* 1.95*  CALCIUM 9.5 9.7 9.9 10.1  --  10.0 9.8  MG 2.1  --   --  1.6* 2.5* 2.3 1.7  PHOS  --   --   --  2.3*  --  3.6  --      GFR: Estimated Creatinine Clearance: 39.7 mL/min (A) (by C-G formula based on SCr of 1.95 mg/dL (H)).  Liver Function Tests: Recent Labs  Lab 10/20/21 0411 10/21/21 0506 10/23/21 0145  AST 13* 13* 14*  ALT _1 ALKPHOS 125 129* 109  BILITOT 0.7 0.3 0.5  PROT 6.7 6.2* 5.9*  ALBUMIN 1.9* 1.9* 2.1*     Recent Labs  Lab 10/20/21 0411  LIPASE 47     CBG: Recent Labs  Lab 10/22/21 1528 10/22/21 1945 10/22/21 2351 10/23/21 0431 10/23/21 0754  GLUCAP 104* 121* 93 79 82      Recent Results (from the past 240 hour(s))  Culture, Respiratory w Gram Stain     Status: None   Collection Time: 10/14/21  9:09 AM   Specimen: Tracheal Aspirate; Respiratory  Result Value Ref Range Status   Specimen Description TRACHEAL ASPIRATE  Final   Special Requests NONE  Final   Gram Stain   Final    MODERATE WBC PRESENT,BOTH PMN AND MONONUCLEAR FEW GRAM POSITIVE COCCI IN PAIRS RARE GRAM NEGATIVE RODS    Culture   Final    FEW Normal respiratory flora-no Staph aureus or Pseudomonas seen Performed at [facility], [address], [place], [place] [address]    Report Status 10/16/2021 FINAL  Final  MRSA Next Gen by PCR, Nasal     Status: None   Collection Time:  10/14/21 10:48 AM   Specimen: Nasal Mucosa; Nasal Swab  Result Value Ref Range Status   MRSA by PCR Next Gen NOT DETECTED NOT DETECTED Final    Comment: (NOTE) The GeneXpert MRSA Assay (FDA approved for NASAL specimens only), is one component of a comprehensive MRSA colonization surveillance program. It is not intended to diagnose MRSA infection nor to guide or monitor treatment for MRSA infections. Test performance is not FDA approved in patients less than 86 years old. Performed at [facility], [place] [address], [place], [place] [address]   Culture, Respiratory w Gram Stain     Status: None   Collection Time: 10/16/21 12:33 PM   Specimen: Tracheal Aspirate; Respiratory  Result Value Ref Range Status   Specimen Description TRACHEAL ASPIRATE  Final   Special Requests NONE  Final   Gram Stain   Final    FEW SQUAMOUS EPITHELIAL CELLS PRESENT MODERATE WBC SEEN FEW GRAM POSITIVE COCCI    Culture   Final    RARE Normal respiratory flora-no Staph aureus or Pseudomonas seen Performed at [facility], [address], [place], [place]  [address]    Report Status 10/18/2021 FINAL  Final       Radiology Studies: No results found.     LOS: 33 days   Gardenia Witter [employer] on [email or web address]  10/23/2021, 9:55 AM

## 2021-10-24 DIAGNOSIS — E1143 Type 2 diabetes mellitus with diabetic autonomic (poly)neuropathy: Secondary | ICD-10-CM

## 2021-10-24 DIAGNOSIS — I10 Essential (primary) hypertension: Secondary | ICD-10-CM | POA: Diagnosis not present

## 2021-10-24 DIAGNOSIS — K3184 Gastroparesis: Secondary | ICD-10-CM | POA: Diagnosis not present

## 2021-10-24 DIAGNOSIS — J9601 Acute respiratory failure with hypoxia: Secondary | ICD-10-CM | POA: Diagnosis not present

## 2021-10-24 LAB — GLUCOSE, CAPILLARY
Glucose-Capillary: 122 mg/dL — ABNORMAL HIGH (ref 70–99)
Glucose-Capillary: 120 mg/dL — ABNORMAL HIGH (ref 70–99)
Glucose-Capillary: 172 mg/dL — ABNORMAL HIGH (ref 70–99)
Glucose-Capillary: 202 mg/dL — ABNORMAL HIGH (ref 70–99)

## 2021-10-24 MED ORDER — METOCLOPRAMIDE HCL 10 MG PO TABS
5.0000 mg | ORAL_TABLET | Freq: Three times a day (TID) | ORAL | Status: DC
  Administered 2021-10-24 – 2021-11-15 (×65): 5 mg via ORAL
  Filled 2021-10-24 (×67): qty 1

## 2021-10-24 MED ORDER — INSULIN DETEMIR 100 UNIT/ML ~~LOC~~ SOLN
12.0000 [IU] | Freq: Two times a day (BID) | SUBCUTANEOUS | Status: DC
Start: 2021-10-24 — End: 2021-10-28
  Administered 2021-10-24 – 2021-10-28 (×9): 12 [IU] via SUBCUTANEOUS
  Filled 2021-10-24 (×11): qty 0.12

## 2021-10-24 MED ORDER — CLONAZEPAM 0.5 MG PO TABS
0.5000 mg | ORAL_TABLET | Freq: Two times a day (BID) | ORAL | Status: DC
  Administered 2021-10-24 – 2021-11-15 (×44): 0.5 mg via ORAL
  Filled 2021-10-24 (×44): qty 1

## 2021-10-24 NOTE — Progress Notes (Signed)
[facility] PROGRESS NOTE   Maureen Jones YTK:[id number] DOB: 19-Jan-1966 DOA: 09/20/2021  PCP: Pcp, No  Brief History/Interval Summary: 55 y/o F who presented to [facility] on 10/10 with reports of acute altered mental status secondary to hypertensive emergency with hypoxic respiratory failure related to VCD/post intubation subglottic and glottic edema/vocal hoarseness  +/- asthmatic exacerbation.  Ultimately required trach placement. Transferred back to ICU on 11/2 after trach became dislodged on floor, resulting in PEA arrest with 10 min CPR and 2mg  epi. ROSC obtained. Trach revised to 6.0 XLT.   Reason for Visit: Acute respiratory failure with hypoxia  Consultants: Critical care medicine.  Significant Hospital Events: Including procedures, antibiotic start and stop dates in addition to other pertinent events   10/10 Admit with acute encephalopathy, hypertensive 10/12 extubated, increased WOB, re-intubated 10/14 extubated , lingering stridor, Received racemic epi x2 over the last 24 hours + another dose of Decadron, Mild agitation requiring restart of Precedex 10/15 swallow evaluation >> dysphagia 3 diet 10/20 decompensated after switch to PO steroids 10/21 better after CAT and back on IV steroids 10/22 agitated back on  BIPAP 10/25 stridor again. Marked work of breathing. Intubated, airway noises, stridor and wheezing completely resolved post intubation. Spoke to ENT who will follow after trach. Trach placed by Dr Vaughan Browner. Seroquel increased to 100 qhs, changes solumedrol to prednisone to begin taper, rested on propofol over night. Tubefeeds started. RUE PICC placed  10/26 prop weaning off attempting ATC  11/2 PEA arrest in setting of dislodged tracheostomy. questionable syncope.  Total 10 minutes CPR. Transferred to intensive care unit intubated via trach stoma for redo trach 11/2/202.  Redo tracheostomy. 6 XLT. 11/4 Concern overnight for STEMI, hep gtt started, cards consulted;  this was artifact 11/5 changed out distal XLT for proximal due to positioning within trachea 11/8 tolerated trach collar for about 6 hours 11/7, placed back on vent      Subjective/Interval History: Patient mentions that she is feeling much better.  No further episodes of nausea and vomiting.  No chest pain.  Shortness of breath is better.  No new complaints offered.  Discussed with nursing staff.     Assessment/Plan:  Nausea and vomiting likely gastroparesis Abdominal x-ray did not show any obstruction.  Lipase level as well as LFTs were normal.   Symptoms suggesting diabetic gastroparesis.  Symptoms have improved with Reglan.  Change to oral metoclopramide.  Continue PPI as well.    Acute respiratory failure with hypoxia with history of vocal cord dysfunction/subglottic and glottic edema She is status post tracheostomy.  She had dislodgment of his tracheostomy on 11/2 leading to PEA arrest.  She required CPR for 10 minutes following which she had ROSC. Has not require any ventilator support for the last 72 hours.  Stable on trach collar. Await further input from pulmonology regarding her tracheostomy.  Healthcare associated pneumonia Completed 7 days of antibiotics.  Stable.  Agitation Seems to be mentating better.  Noted to be on clonazepam and Seroquel.  Doses are being gradually decreased.  We will continue to wean it down over several days.    Essential hypertension Noted to be on hydralazine, nitrates, carvedilol.   Dose of hydralazine was increased 2 days ago and dose of carvedilol was increased yesterday.  Blood pressure trends seem to be slightly better.  Continue to monitor for now.  Will not make any changes to antihypertensive regimen today.  Diabetes mellitus type 2, uncontrolled with hyperglycemia HbA1c 12.7.  Dose of Levemir further  decreased today since her glucose levels remain borderline low.    Chronic kidney disease stage IIIb Presented with elevated  creatinine.  No old labs in our system.  Care everywhere was reviewed.  She had a creatinine of 1.7 last year.  Likely has chronic kidney disease.  Renal function has been stable.  Avoid nephrotoxic agents.  Monitor urine output.    Normocytic anemia Likely anemia of critical illness.  No evidence of overt bleeding.  Anemia panel does not show any clear-cut deficiencies.  Hypomagnesemia and hypophosphatemia Has been stable.  Recheck labs tomorrow  Dysphagia/nutrition Secondary to tracheostomy.  Seen by speech therapy.  No longer on tube feedings.  Tolerating her diet.  Obesity Estimated body mass index is 35.58 kg/m as calculated from the following:   Height as of this encounter: 5\' 6"  (1.676 m).   Weight as of this encounter: 100 kg.    DVT Prophylaxis: Subcutaneous heparin Code Status: Partial code Family Communication: Discussed with patient. Disposition Plan: It appears that she is being considered for inpatient rehabilitation versus SNF.    Status is: Inpatient  Remains inpatient appropriate because: Acute respiratory failure requiring mechanical ventilation on and off      Medications: Scheduled:  arformoterol  15 mcg Nebulization BID   aspirin  81 mg Oral Daily   budesonide (PULMICORT) nebulizer solution  0.25 mg Nebulization BID   carvedilol  12.5 mg Oral BID   chlorhexidine  15 mL Mouth Rinse BID   Chlorhexidine Gluconate Cloth  6 each Topical Daily   clonazePAM  1 mg Oral BID   DULoxetine  60 mg Oral Daily   fluticasone  2 spray Each Nare Daily   heparin injection (subcutaneous)  5,000 Units Subcutaneous Q8H   hydrALAZINE  75 mg Oral Q8H   insulin aspart  0-15 Units Subcutaneous TID WC   insulin aspart  0-5 Units Subcutaneous QHS   insulin detemir  12 Units Subcutaneous BID   isosorbide dinitrate  10 mg Oral BID   lidocaine  2 patch Transdermal Q24H   loratadine  10 mg Oral Daily   mouth rinse  15 mL Mouth Rinse q12n4p   metoCLOPramide (REGLAN) injection   5 mg Intravenous TID   montelukast  10 mg Oral QHS   nystatin  5 mL Oral QID   pantoprazole  40 mg Oral BID   QUEtiapine  50 mg Oral BID   sodium chloride flush  10-40 mL Intracatheter Q12H   Continuous:  sodium chloride Stopped (10/21/21 0722)   JJO:ACZYSA chloride, acetaminophen, acetaminophen, albuterol, clonazePAM, dextrose, docusate sodium, guaiFENesin, hydrALAZINE, nitroGLYCERIN, ondansetron (ZOFRAN) IV, phenol, polyethylene glycol, sodium chloride  Antibiotics: Anti-infectives (From admission, onward)    Start     Dose/Rate Route Frequency Ordered Stop   10/18/21 0830  ceFEPIme (MAXIPIME) 2 g in sodium chloride 0.9 % 100 mL IVPB  Status:  Discontinued        2 g 200 mL/hr over 30 Minutes Intravenous Every 24 hours 10/17/21 1055 10/17/21 1056   10/17/21 2200  ceFEPIme (MAXIPIME) 2 g in sodium chloride 0.9 % 100 mL IVPB        2 g 200 mL/hr over 30 Minutes Intravenous Every 12 hours 10/17/21 1056 10/18/21 2145   10/14/21 0830  ceFEPIme (MAXIPIME) 2 g in sodium chloride 0.9 % 100 mL IVPB  Status:  Discontinued        2 g 200 mL/hr over 30 Minutes Intravenous Every 24 hours 10/14/21 0721 10/17/21 1055   10/14/21  0215  piperacillin-tazobactam (ZOSYN) IVPB 3.375 g        3.375 g 100 mL/hr over 30 Minutes Intravenous  Once 10/14/21 0126 10/14/21 0302   10/04/21 1100  vancomycin (VANCOREADY) IVPB 1250 mg/250 mL  Status:  Discontinued        1,250 mg 166.7 mL/hr over 90 Minutes Intravenous Every 48 hours 10/02/21 0939 10/03/21 1508   10/02/21 2200  ceFEPIme (MAXIPIME) 2 g in sodium chloride 0.9 % 100 mL IVPB  Status:  Discontinued        2 g 200 mL/hr over 30 Minutes Intravenous Every 12 hours 10/02/21 0948 10/04/21 1010   10/02/21 1030  vancomycin (VANCOREADY) IVPB 2000 mg/400 mL        2,000 mg 200 mL/hr over 120 Minutes Intravenous  Once 10/02/21 0939 10/02/21 1324   09/27/21 0900  ceFEPIme (MAXIPIME) 2 g in sodium chloride 0.9 % 100 mL IVPB  Status:  Discontinued        2  g 200 mL/hr over 30 Minutes Intravenous Every 12 hours 09/27/21 0800 10/02/21 0948   09/27/21 0900  vancomycin (VANCOREADY) IVPB 1500 mg/300 mL  Status:  Discontinued        1,500 mg 150 mL/hr over 120 Minutes Intravenous Every 48 hours 09/27/21 0800 09/28/21 1058   09/21/21 0800  cefTRIAXone (ROCEPHIN) 2 g in sodium chloride 0.9 % 100 mL IVPB        2 g 200 mL/hr over 30 Minutes Intravenous Every 24 hours 09/21/21 0742 09/25/21 0808   09/20/21 0945  cefTRIAXone (ROCEPHIN) 2 g in sodium chloride 0.9 % 100 mL IVPB  Status:  Discontinued        2 g 200 mL/hr over 30 Minutes Intravenous Every 24 hours 09/20/21 0936 09/21/21 0742       Objective:  Vital Signs  Vitals:   10/24/21 0722 10/24/21 0741 10/24/21 0800 10/24/21 0819  BP:   (!) 156/81 (!) 156/81  Pulse:    100  Resp: 19   19  Temp:  98.6 F (37 C)    TempSrc:  Oral    SpO2:    95%  Weight:      Height:        Intake/Output Summary (Last 24 hours) at 10/24/2021 1016 Last data filed at 10/24/2021 0507 Gross per 24 hour  Intake --  Output 950 ml  Net -950 ml    Filed Weights   10/19/21 0327 10/21/21 0459 10/24/21 0500  Weight: 99.1 kg 101.6 kg 100 kg    General appearance: Awake alert.  In no distress Tracheostomy is noted Resp: Normal effort at rest.  Coarse breath sounds bilaterally.  Few crackles at the bases.  No wheezing or rhonchi. Cardio: S1-S2 is normal regular.  No S3-S4.  No rubs murmurs or bruit GI: Abdomen is soft.  Nontender nondistended.  Bowel sounds are present normal.  No masses organomegaly Extremities: No edema.  Full range of motion of lower extremities. Neurologic: Alert and oriented x3.  No focal neurological deficits.      Lab Results:  Data Reviewed: I have personally reviewed following labs and imaging studies  CBC: Recent Labs  Lab 10/18/21 0405 10/19/21 0319 10/20/21 0411 10/21/21 0506 10/23/21 0145  WBC 11.3* 10.2 12.0* 11.2* 11.7*  NEUTROABS  --   --  8.9*  --   --    HGB 7.3* 7.0* 7.9* 7.7* 8.0*  HCT 25.2* 24.4* 27.3* 26.9* 26.7*  MCV 98.8 100.0 98.6 98.5 96.0  PLT 283  279 330 363 350     Basic Metabolic Panel: Recent Labs  Lab 10/18/21 0405 10/19/21 0319 10/20/21 0411 10/20/21 2040 10/21/21 0506 10/23/21 0145  NA 144 147* 145  --  142 138  K 4.7 4.7 4.6  --  4.0 3.9  CL 111 113* 109  --  107 103  CO2 27 27 28   --  28 27  GLUCOSE 247* 189* 197*  --  147* 87  BUN 42* 41* 36*  --  34* 23*  CREATININE 2.00* 1.99* 1.79*  --  2.00* 1.95*  CALCIUM 9.7 9.9 10.1  --  10.0 9.8  MG  --   --  1.6* 2.5* 2.3 1.7  PHOS  --   --  2.3*  --  3.6  --      GFR: Estimated Creatinine Clearance: 39.4 mL/min (A) (by C-G formula based on SCr of 1.95 mg/dL (H)).  Liver Function Tests: Recent Labs  Lab 10/20/21 0411 10/21/21 0506 10/23/21 0145  AST 13* 13* 14*  ALT 23 21 16   ALKPHOS 125 129* 109  BILITOT 0.7 0.3 0.5  PROT 6.7 6.2* 5.9*  ALBUMIN 1.9* 1.9* 2.1*     Recent Labs  Lab 10/20/21 0411  LIPASE 47     CBG: Recent Labs  Lab 10/23/21 0754 10/23/21 1127 10/23/21 1510 10/23/21 2131 10/24/21 0741  GLUCAP 82 90 90 99 122*      Recent Results (from the past 240 hour(s))  Culture, Respiratory w Gram Stain     Status: None   Collection Time: 10/16/21 12:33 PM   Specimen: Tracheal Aspirate; Respiratory  Result Value Ref Range Status   Specimen Description TRACHEAL ASPIRATE  Final   Special Requests NONE  Final   Gram Stain   Final    FEW SQUAMOUS EPITHELIAL CELLS PRESENT MODERATE WBC SEEN FEW GRAM POSITIVE COCCI    Culture   Final    RARE Normal respiratory flora-no Staph aureus or Pseudomonas seen Performed at [facility], [address], [place],  [address]    Report Status 10/18/2021 FINAL  Final       Radiology Studies: No results found.     LOS: 34 days   Ulis Kaps [employer] on [email or web address]  10/24/2021, 10:16 AM

## 2021-10-25 DIAGNOSIS — J9601 Acute respiratory failure with hypoxia: Secondary | ICD-10-CM | POA: Diagnosis not present

## 2021-10-25 DIAGNOSIS — I10 Essential (primary) hypertension: Secondary | ICD-10-CM | POA: Diagnosis not present

## 2021-10-25 DIAGNOSIS — R092 Respiratory arrest: Secondary | ICD-10-CM

## 2021-10-25 DIAGNOSIS — K3184 Gastroparesis: Secondary | ICD-10-CM | POA: Diagnosis not present

## 2021-10-25 DIAGNOSIS — J384 Edema of larynx: Secondary | ICD-10-CM | POA: Diagnosis not present

## 2021-10-25 DIAGNOSIS — E1143 Type 2 diabetes mellitus with diabetic autonomic (poly)neuropathy: Secondary | ICD-10-CM | POA: Diagnosis not present

## 2021-10-25 LAB — COMPREHENSIVE METABOLIC PANEL
ALT: 17 U/L (ref 0–44)
AST: 19 U/L (ref 15–41)
Albumin: 2.1 g/dL — ABNORMAL LOW (ref 3.5–5.0)
Alkaline Phosphatase: 119 U/L (ref 38–126)
Anion gap: 8 (ref 5–15)
BUN: 31 mg/dL — ABNORMAL HIGH (ref 6–20)
CO2: 27 mmol/L (ref 22–32)
Calcium: 9 mg/dL (ref 8.9–10.3)
Chloride: 103 mmol/L (ref 98–111)
Creatinine, Ser: 2.05 mg/dL — ABNORMAL HIGH (ref 0.44–1.00)
GFR, Estimated: 28 mL/min — ABNORMAL LOW (ref >60)
Glucose, Bld: 159 mg/dL — ABNORMAL HIGH (ref 70–99)
Potassium: 3.9 mmol/L (ref 3.5–5.1)
Sodium: 138 mmol/L (ref 135–145)
Total Bilirubin: 0.4 mg/dL (ref 0.3–1.2)
Total Protein: 5.5 g/dL — ABNORMAL LOW (ref 6.5–8.1)

## 2021-10-25 LAB — CBC
HCT: 26.6 % — ABNORMAL LOW (ref 36.0–46.0)
Hemoglobin: 7.8 g/dL — ABNORMAL LOW (ref 12.0–15.0)
MCH: 28.5 pg (ref 26.0–34.0)
MCHC: 29.3 g/dL — ABNORMAL LOW (ref 30.0–36.0)
MCV: 97.1 fL (ref 80.0–100.0)
Platelets: 317 10*3/uL (ref 150–400)
RBC: 2.74 MIL/uL — ABNORMAL LOW (ref 3.87–5.11)
RDW: 15.1 % (ref 11.5–15.5)
WBC: 11.3 10*3/uL — ABNORMAL HIGH (ref 4.0–10.5)
nRBC: 0 % (ref 0.0–0.2)

## 2021-10-25 LAB — GLUCOSE, CAPILLARY
Glucose-Capillary: 121 mg/dL — ABNORMAL HIGH (ref 70–99)
Glucose-Capillary: 172 mg/dL — ABNORMAL HIGH (ref 70–99)
Glucose-Capillary: 181 mg/dL — ABNORMAL HIGH (ref 70–99)
Glucose-Capillary: 204 mg/dL — ABNORMAL HIGH (ref 70–99)

## 2021-10-25 LAB — MAGNESIUM: Magnesium: 1.8 mg/dL (ref 1.7–2.4)

## 2021-10-25 LAB — PHOSPHORUS: Phosphorus: 3.6 mg/dL (ref 2.5–4.6)

## 2021-10-25 MED ORDER — HYDRALAZINE HCL 50 MG PO TABS
100.0000 mg | ORAL_TABLET | Freq: Three times a day (TID) | ORAL | Status: DC
  Administered 2021-10-25 – 2021-11-15 (×63): 100 mg via ORAL
  Filled 2021-10-25 (×63): qty 2

## 2021-10-25 NOTE — Progress Notes (Signed)
[facility] PROGRESS NOTE   Maureen Jones EGB:[id number] DOB: 27-Mar-1966 DOA: 09/20/2021  PCP: Pcp, No  Brief History/Interval Summary: 55 y/o F who presented to [facility] on 10/10 with reports of acute altered mental status secondary to hypertensive emergency with hypoxic respiratory failure related to VCD/post intubation subglottic and glottic edema/vocal hoarseness  +/- asthmatic exacerbation.  Ultimately required trach placement. Transferred back to ICU on 11/2 after trach became dislodged on floor, resulting in PEA arrest with 10 min CPR and 2mg  epi. ROSC obtained. Trach revised to 6.0 XLT.   Reason for Visit: Acute respiratory failure with hypoxia  Consultants: Critical care medicine.  Significant Hospital Events: Including procedures, antibiotic start and stop dates in addition to other pertinent events   10/10 Admit with acute encephalopathy, hypertensive 10/12 extubated, increased WOB, re-intubated 10/14 extubated , lingering stridor, Received racemic epi x2 over the last 24 hours + another dose of Decadron, Mild agitation requiring restart of Precedex 10/15 swallow evaluation >> dysphagia 3 diet 10/20 decompensated after switch to PO steroids 10/21 better after CAT and back on IV steroids 10/22 agitated back on  BIPAP 10/25 stridor again. Marked work of breathing. Intubated, airway noises, stridor and wheezing completely resolved post intubation. Spoke to ENT who will follow after trach. Trach placed by Dr Vaughan Browner. Seroquel increased to 100 qhs, changes solumedrol to prednisone to begin taper, rested on propofol over night. Tubefeeds started. RUE PICC placed  10/26 prop weaning off attempting ATC  11/2 PEA arrest in setting of dislodged tracheostomy. questionable syncope.  Total 10 minutes CPR. Transferred to intensive care unit intubated via trach stoma for redo trach 11/2/202.  Redo tracheostomy. 6 XLT. 11/4 Concern overnight for STEMI, hep gtt started, cards consulted;  this was artifact 11/5 changed out distal XLT for proximal due to positioning within trachea 11/8 tolerated trach collar for about 6 hours 11/7, placed back on vent      Subjective/Interval History: Patient denies any complaints this morning.  No further episodes of nausea or vomiting.  No shortness of breath more than usual.  No chest pain.     Assessment/Plan:  Acute respiratory failure with hypoxia with history of vocal cord dysfunction/subglottic and glottic edema She is status post tracheostomy.  She had dislodgment of his tracheostomy on 11/2 leading to PEA arrest.  She required CPR for 10 minutes following which she had ROSC. Respiratory status is stable.  Has not required ventilator for last several days.  Stable on trach collar. Await further input from pulmonology regarding her tracheostomy.  Essential hypertension Noted to be on hydralazine, nitrates, carvedilol.  Will increase the dose of hydralazine some more since blood pressure remains poorly controlled.  Carvedilol dose was increased 2 days ago.  Continue to monitor.  Diabetes mellitus type 2, uncontrolled with hyperglycemia HbA1c 12.7.  Due to borderline low glucose levels dose of Levemir was adjusted the last few days.  CBGs have stabilized.  Continue to monitor.    Diabetic gastroparesis Abdominal x-ray did not show any obstruction.  Lipase level as well as LFTs were normal.   Symptoms suggesting diabetic gastroparesis.  Symptoms have improved with the Reglan.  It was changed over to oral route yesterday.  Continue PPI as well.   Chronic kidney disease stage IIIb Presented with elevated creatinine.  No old labs in our system.  Care everywhere was reviewed.  She had a creatinine of 1.7 last year.  Likely has chronic kidney disease.  Renal function stable for the most part.  Monitor urine output.  Avoid nephrotoxic agents.     Healthcare associated pneumonia Completed 7 days of antibiotics.   Stable.  Agitation Seems to have resolved.  Continue to wean down clonazepam as well as Seroquel.  Stable this morning.  Check periodically. Normocytic anemia Likely anemia of critical illness.  No evidence of overt bleeding.  Anemia panel does not show any clear-cut deficiencies.  Hypomagnesemia and hypophosphatemia Has been stable.  Recheck labs tomorrow  Dysphagia/nutrition Secondary to tracheostomy.  Seen by speech therapy.  No longer on tube feedings.  Tolerating her diet.  Obesity Estimated body mass index is 35.58 kg/m as calculated from the following:   Height as of this encounter: 5\' 6"  (1.676 m).   Weight as of this encounter: 100 kg.    DVT Prophylaxis: Subcutaneous heparin Code Status: Partial code Family Communication: Discussed with patient. Disposition Plan: It appears that she is being considered for inpatient rehabilitation versus SNF.  Will need to know from pulmonology as to when she can be safely discharged.  Status is: Inpatient  Remains inpatient appropriate because: Acute respiratory failure requiring mechanical ventilation on and off      Medications: Scheduled:  arformoterol  15 mcg Nebulization BID   aspirin  81 mg Oral Daily   budesonide (PULMICORT) nebulizer solution  0.25 mg Nebulization BID   carvedilol  12.5 mg Oral BID   chlorhexidine  15 mL Mouth Rinse BID   Chlorhexidine Gluconate Cloth  6 each Topical Daily   clonazePAM  0.5 mg Oral BID   DULoxetine  60 mg Oral Daily   fluticasone  2 spray Each Nare Daily   heparin injection (subcutaneous)  5,000 Units Subcutaneous Q8H   hydrALAZINE  75 mg Oral Q8H   insulin aspart  0-15 Units Subcutaneous TID WC   insulin aspart  0-5 Units Subcutaneous QHS   insulin detemir  12 Units Subcutaneous BID   isosorbide dinitrate  10 mg Oral BID   lidocaine  2 patch Transdermal Q24H   loratadine  10 mg Oral Daily   mouth rinse  15 mL Mouth Rinse q12n4p   metoCLOPramide  5 mg Oral TID AC   montelukast   10 mg Oral QHS   pantoprazole  40 mg Oral BID   QUEtiapine  50 mg Oral BID   sodium chloride flush  10-40 mL Intracatheter Q12H   Continuous:  sodium chloride Stopped (10/21/21 0722)   TKZ:SWFUXN chloride, acetaminophen, acetaminophen, albuterol, clonazePAM, dextrose, docusate sodium, guaiFENesin, hydrALAZINE, nitroGLYCERIN, ondansetron (ZOFRAN) IV, phenol, polyethylene glycol, sodium chloride  Antibiotics: Anti-infectives (From admission, onward)    Start     Dose/Rate Route Frequency Ordered Stop   10/18/21 0830  ceFEPIme (MAXIPIME) 2 g in sodium chloride 0.9 % 100 mL IVPB  Status:  Discontinued        2 g 200 mL/hr over 30 Minutes Intravenous Every 24 hours 10/17/21 1055 10/17/21 1056   10/17/21 2200  ceFEPIme (MAXIPIME) 2 g in sodium chloride 0.9 % 100 mL IVPB        2 g 200 mL/hr over 30 Minutes Intravenous Every 12 hours 10/17/21 1056 10/18/21 2145   10/14/21 0830  ceFEPIme (MAXIPIME) 2 g in sodium chloride 0.9 % 100 mL IVPB  Status:  Discontinued        2 g 200 mL/hr over 30 Minutes Intravenous Every 24 hours 10/14/21 0721 10/17/21 1055   10/14/21 0215  piperacillin-tazobactam (ZOSYN) IVPB 3.375 g        3.375 g 100  mL/hr over 30 Minutes Intravenous  Once 10/14/21 0126 10/14/21 0302   10/04/21 1100  vancomycin (VANCOREADY) IVPB 1250 mg/250 mL  Status:  Discontinued        1,250 mg 166.7 mL/hr over 90 Minutes Intravenous Every 48 hours 10/02/21 0939 10/03/21 1508   10/02/21 2200  ceFEPIme (MAXIPIME) 2 g in sodium chloride 0.9 % 100 mL IVPB  Status:  Discontinued        2 g 200 mL/hr over 30 Minutes Intravenous Every 12 hours 10/02/21 0948 10/04/21 1010   10/02/21 1030  vancomycin (VANCOREADY) IVPB 2000 mg/400 mL        2,000 mg 200 mL/hr over 120 Minutes Intravenous  Once 10/02/21 0939 10/02/21 1324   09/27/21 0900  ceFEPIme (MAXIPIME) 2 g in sodium chloride 0.9 % 100 mL IVPB  Status:  Discontinued        2 g 200 mL/hr over 30 Minutes Intravenous Every 12 hours 09/27/21  0800 10/02/21 0948   09/27/21 0900  vancomycin (VANCOREADY) IVPB 1500 mg/300 mL  Status:  Discontinued        1,500 mg 150 mL/hr over 120 Minutes Intravenous Every 48 hours 09/27/21 0800 09/28/21 1058   09/21/21 0800  cefTRIAXone (ROCEPHIN) 2 g in sodium chloride 0.9 % 100 mL IVPB        2 g 200 mL/hr over 30 Minutes Intravenous Every 24 hours 09/21/21 0742 09/25/21 0808   09/20/21 0945  cefTRIAXone (ROCEPHIN) 2 g in sodium chloride 0.9 % 100 mL IVPB  Status:  Discontinued        2 g 200 mL/hr over 30 Minutes Intravenous Every 24 hours 09/20/21 0936 09/21/21 0742       Objective:  Vital Signs  Vitals:   10/25/21 0744 10/25/21 0749 10/25/21 0800 10/25/21 0802  BP: (!) 181/85 (!) 181/85 (!) 177/86   Pulse: 87 93 95   Resp:  (!) 27 (!) 21   Temp:    98.6 F (37 C)  TempSrc:    Oral  SpO2:  96% 94%   Weight:      Height:        Intake/Output Summary (Last 24 hours) at 10/25/2021 0913 Last data filed at 10/25/2021 0830 Gross per 24 hour  Intake 240 ml  Output 300 ml  Net -60 ml    Filed Weights   10/21/21 0459 10/24/21 0500 10/25/21 0500  Weight: 101.6 kg 100 kg 100 kg    General appearance: Awake alert.  In no distress Tracheostomy is noted Resp: Normal effort at rest.  Coarse breath sound bilaterally.  No wheezing or rhonchi. Cardio: S1-S2 is normal regular.  No S3-S4.  No rubs murmurs or bruit GI: Abdomen is soft.  Nontender nondistended.  Bowel sounds are present normal.  No masses organomegaly Extremities: No edema.  Full range of motion of lower extremities. Neurologic: Alert and oriented x3.  No focal neurological deficits.      Lab Results:  Data Reviewed: I have personally reviewed following labs and imaging studies  CBC: Recent Labs  Lab 10/19/21 0319 10/20/21 0411 10/21/21 0506 10/23/21 0145 10/25/21 0415  WBC 10.2 12.0* 11.2* 11.7* 11.3*  NEUTROABS  --  8.9*  --   --   --   HGB 7.0* 7.9* 7.7* 8.0* 7.8*  HCT 24.4* 27.3* 26.9* 26.7* 26.6*   MCV 100.0 98.6 98.5 96.0 97.1  PLT 279 330 363 350 317     Basic Metabolic Panel: Recent Labs  Lab 10/19/21 0319 10/20/21 0411  10/20/21 2040 10/21/21 0506 10/23/21 0145 10/25/21 0415  NA 147* 145  --  142 138 138  K 4.7 4.6  --  4.0 3.9 3.9  CL 113* 109  --  107 103 103  CO2 27 28  --  28 27 27   GLUCOSE 189* 197*  --  147* 87 159*  BUN 41* 36*  --  34* 23* 31*  CREATININE 1.99* 1.79*  --  2.00* 1.95* 2.05*  CALCIUM 9.9 10.1  --  10.0 9.8 9.0  MG  --  1.6* 2.5* 2.3 1.7 1.8  PHOS  --  2.3*  --  3.6  --  3.6     GFR: Estimated Creatinine Clearance: 37.4 mL/min (A) (by C-G formula based on SCr of 2.05 mg/dL (H)).  Liver Function Tests: Recent Labs  Lab 10/20/21 0411 10/21/21 0506 10/23/21 0145 10/25/21 0415  AST 13* 13* 14* 19  ALT 23 21 16 17   ALKPHOS 125 129* 109 119  BILITOT 0.7 0.3 0.5 0.4  PROT 6.7 6.2* 5.9* 5.5*  ALBUMIN 1.9* 1.9* 2.1* 2.1*     Recent Labs  Lab 10/20/21 0411  LIPASE 47     CBG: Recent Labs  Lab 10/24/21 0741 10/24/21 1103 10/24/21 1531 10/24/21 2127 10/25/21 0723  GLUCAP 122* 120* 172* 202* 121*      Recent Results (from the past 240 hour(s))  Culture, Respiratory w Gram Stain     Status: None   Collection Time: 10/16/21 12:33 PM   Specimen: Tracheal Aspirate; Respiratory  Result Value Ref Range Status   Specimen Description TRACHEAL ASPIRATE  Final   Special Requests NONE  Final   Gram Stain   Final    FEW SQUAMOUS EPITHELIAL CELLS PRESENT MODERATE WBC SEEN FEW GRAM POSITIVE COCCI    Culture   Final    RARE Normal respiratory flora-no Staph aureus or Pseudomonas seen Performed at [facility], [address], [place],  [address]    Report Status 10/18/2021 FINAL  Final       Radiology Studies: No results found.     LOS: 35 days   Harmoney Sienkiewicz [employer] on [email or web address]  10/25/2021, 9:13 AM

## 2021-10-25 NOTE — Progress Notes (Signed)
Patient placed on PMV at 1635 tolerating well.  Patient ambulated around unit for 1 lap at this time as well with PMV in place tolerated well.  After walk patient up to chair.  PMV was removed at 1720 per patient request to rest.  No s/s of distress noted, denies any pain.  Total PVM time 45 min.

## 2021-10-25 NOTE — Progress Notes (Signed)
Occupational Therapy Treatment Patient Details Name: Maureen Jones MRN: [id number] DOB: [birth year] Today's Date: 10/25/2021   History of present illness 55 y.o. female admitted 09/20/21 with AMS. 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 CXR with low lung volumes, L>R interstitial opacities. Acute metabolic encephalopathy in setting of HTN urgency, possible PRES. ETT (10/10-10/12, 10/12-10/14). Course complicated by confusion/agitation overnight 10/17. 10/25 trach and bronch with return to vent.  11/2 pt passed out while returning from the bathroom pt with respiratory distress from disloged trach leading to PEA arrest with CPR x 10 min. ETT via stoma.  Trach revised 11/2.  PMHx:HTN, DM.   OT comments  This 55 yo female seen today to focus on LBD and grooming. Pt is doing great. Currently she is at an overall min guard A level in room with/without AD when up on her feet and needs setup/S for basic ADLs. Feel with and inpatient rehab stay pt could get to a Mod I level for all basic ADLs, IADLs, and manage her trach care. We will continue to follow.   Recommendations for follow up therapy are one component of a multi-disciplinary discharge planning process, led by the attending physician.  Recommendations may be updated based on patient status, additional functional criteria and insurance authorization.    Follow Up Recommendations  Acute inpatient rehab (3hours/day)    Assistance Recommended at Discharge Intermittent Supervision/Assistance  Equipment Recommendations  BSC/3in1       Precautions / Restrictions Precautions Precautions: Fall;Other (comment) Precaution Comments: trach Restrictions Weight Bearing Restrictions: No       Mobility Bed Mobility  General bed mobility comments: pt up in recliner upon my arrival    Transfers Overall transfer level: Needs assistance Equipment used: None Transfers: Sit to/from Stand Sit to Stand: Min guard           General transfer  comment: Pt able to stand up and take 5 small steps to sink from recliner with min guard A and no AD     Balance Overall balance assessment: Needs assistance Sitting-balance support: No upper extremity supported;Feet supported Sitting balance-[name]: Good Sitting balance - Comments: EOB without physical assist   Standing balance support: No upper extremity supported;During functional activity Standing balance-[name]: Fair Standing balance comment: walker and min guard for static standing                           ADL either performed or assessed with clinical judgement   ADL Overall ADL's : Needs assistance/impaired       Grooming Details (indicate cue type and reason): Pt stood at sink to wash face and brush teeth with min guard A; sat to do hair cream and combing it out.             Lower Body Dressing: Min guard;Sit to/from stand Lower Body Dressing Details (indicate cue type and reason): Pt able to cross her legs to get to her feet while seatd to doff/donn socks and apply lotion                    Extremity/Trunk Assessment Upper Extremity Assessment Upper Extremity Assessment: Overall WFL for tasks assessed            Vision Patient Visual Report: No change from baseline            Cognition Arousal/Alertness: Awake/alert Behavior During Therapy: WFL for tasks assessed/performed Overall Cognitive Status: Within Functional Limits for tasks assessed  General Comments: pt with trach and responding appropriately to all cues/commands and questions                     Pertinent Vitals/ Pain       Pain Assessment: No/denies pain Pain Score: 0-No pain Pain Location: chest Pain Descriptors / Indicators: Sore Pain Intervention(s): Limited activity within patient's tolerance;Monitored during session;Repositioned         Frequency  Min 2X/week        Progress Toward  Goals  OT Goals(current goals can now be found in the care plan section)  Progress towards OT goals: Progressing toward goals  Acute Rehab OT Goals OT Goal Formulation: With patient Time For Goal Achievement: 10/28/21 Potential to Achieve Goals: Good  Plan Discharge plan remains appropriate;Frequency remains appropriate       AM-PAC OT "6 Clicks" Daily Activity     Outcome Measure   Help from another person eating meals?: None Help from another person taking care of personal grooming?: A Little Help from another person toileting, which includes using toliet, bedpan, or urinal?: A Little Help from another person bathing (including washing, rinsing, drying)?: A Little Help from another person to put on and taking off regular upper body clothing?: A Little Help from another person to put on and taking off regular lower body clothing?: A Little 6 Click Score: 19    End of Session Equipment Utilized During Treatment: Oxygen (trach collar)  OT Visit Diagnosis: Other abnormalities of gait and mobility (R26.89)   Activity Tolerance Patient tolerated treatment well   Patient Left in chair;with call bell/phone within reach;with chair alarm set;with family/visitor present           Time: 7209-4709 OT Time Calculation (min): 19 min  Charges: OT General Charges $OT Visit: 1 Visit OT Treatments $Self Care/Home Management : 8-22 mins  [place], OTR/L Acute [employer] Pager [phone] Office [phone]    Almon Register 10/25/2021, 11:35 AM

## 2021-10-25 NOTE — PMR Pre-admission (Shared)
PMR Admission Coordinator Pre-Admission Assessment  Patient: Maureen Jones is an 55 y.o., female MRN: [id number] DOB: 06-13-1966 Height: 5' 6" (167.6 cm) Weight: 100 kg  Insurance Information HMO: yes     PPO:      PCP:      IPA:      80/20:      OTHER:  PRIMARY: UHC Medicare       Policy#: [id number]      Subscriber: Pt CM Name: ***      Phone#: ***     Fax#: *** Pre-Cert#: [id number]      Employer: *** Benefits:  Phone #:      Name:  Irene Shipper Date: 07/12/2021 - 12/11/2021 Deductible: $0 (does not have deductible) OOP Max: $4,500 ($222.67 met) CIR: $325/day co-pay for days 1-5, $0/day co-pay for days 6+ SNF: $0/day copay for days 1-20, $188/day copay for days 21-44, $0/day copay for days 45-100; limited to 100 days/cal yr Outpatient: $30/visit co-pay Home Health:  100% coverage; limited by medical necessity Providers: in network   SECONDARY:       Policy#:      Phone#:   Development worker, community:       Phone#:   [employer] for patients in Inpatient Rehabilitation Facilities with attached Privacy Act [place] Records was provided and verbally reviewed with: Patient  Emergency Contact Information Contact Information     Name Relation Home Work Mobile   Clayton Friend   [phone]   Lucio Edward Father   [phone]   Anderson,Cynthia Relative   [phone]       Current Medical History  Patient Admitting Diagnosis: Respiratory Failure History of Present Illness:  Pt. Is a 55 y/o female who presented [facility] on 10/10 with reports of acute altered mental status secondary to hypertensive emergency with hypoxic respiratory failure related to VCD/post intubation subglottic and glottic edema/vocal hoarseness  +/- asthmatic exacerbation.  Ultimately required trach placement. Transferred back to ICU on 11/2 after trach became dislodged on floor, resulting in PEA arrest with 10 min CPR and 16m epi. ROSC obtained. Trach revised to 6.0 XLT. Pt.  With decline in functional mobility and ADLS, so CIR was consulted to assist return to PLOF.  Complete NIHSS TOTAL: 4  [facility] physician.  Past Medical History  Past Medical History:  Diagnosis Date   Asthma    Di[place]    Glottic and subglottic edema 2022   Hypertension     Has the patient had major surgery during 100 days prior to admission? Yes  Family History   family history is not on file.  Current Medications  Current Facility-Administered Medications:    0.9 %  sodium chloride infusion, , Intravenous, PRN, Olalere, Adewale A, MD, Stopped at 10/21/21 05859  acetaminophen (TYLENOL) suppository 650 mg, 650 mg, Rectal, Q4H PRN, YElsie Lincoln MD, 650 mg at 10/14/21 0225   acetaminophen (TYLENOL) tablet 650 mg, 650 mg, Oral, Q4H PRN, KBonnielee Haff MD   albuterol (PROVENTIL) (2.5 MG/3ML) 0.083% nebulizer solution 2.5 mg, 2.5 mg, Nebulization, Q4H PRN, SCandee Furbish MD, 2.5 mg at 10/22/21 0023   arformoterol (BROVANA) nebulizer solution 15 mcg, 15 mcg, Nebulization, BID, SCandee Furbish MD, 15 mcg at 10/25/21 0744   aspirin chewable tablet 81 mg, 81 mg, Oral, Daily, Olalere, Adewale A, MD, 81 mg at 10/25/21 1015   budesonide (PULMICORT) nebulizer solution 0.25 mg, 0.25  mg, Nebulization, BID, Candee Furbish, MD, 0.25 mg at 10/25/21 0744   carvedilol (COREG) tablet 12.5 mg, 12.5 mg, Oral, BID, Bonnielee Haff, MD, 12.5 mg at 10/25/21 0745   chlorhexidine (PERIDEX) 0.12 % solution 15 mL, 15 mL, Mouth Rinse, BID, Bonnielee Haff, MD, 15 mL at 10/25/21 1014   Chlorhexidine Gluconate Cloth 2 % PADS 6 each, 6 each, Topical, Daily, Hayden Pedro M, NP, 6 each at 10/25/21 1015   clonazePAM (KLONOPIN) tablet 0.5 mg, 0.5 mg, Oral, TID PRN, Ander Slade, Adewale A, MD   clonazePAM (KLONOPIN) tablet 0.5 mg, 0.5 mg, Oral, BID, Bonnielee Haff, MD, 0.5 mg at 10/25/21  1015   dextrose 50 % solution 0-50 mL, 0-50 mL, Intravenous, PRN, Omar Person, NP, 50 mL at 10/13/21 2352   docusate sodium (COLACE) capsule 100 mg, 100 mg, Oral, BID PRN, Olalere, Adewale A, MD   DULoxetine (CYMBALTA) DR capsule 60 mg, 60 mg, Oral, Daily, Bonnielee Haff, MD, 60 mg at 10/25/21 1015   fluticasone (FLONASE) 50 MCG/ACT nasal spray 2 spray, 2 spray, Each Nare, Daily, Candee Furbish, MD, 2 spray at 10/25/21 1014   guaiFENesin (ROBITUSSIN) 100 MG/5ML liquid 5 mL, 5 mL, Oral, Q4H PRN, Olalere, Adewale A, MD, 5 mL at 10/24/21 1554   heparin injection 5,000 Units, 5,000 Units, Subcutaneous, Q8H, Estill Cotta, NP, 5,000 Units at 10/25/21 0524   hydrALAZINE (APRESOLINE) injection 10 mg, 10 mg, Intravenous, Q4H PRN, Omar Person, NP, 10 mg at 10/23/21 1922   hydrALAZINE (APRESOLINE) tablet 100 mg, 100 mg, Oral, Q8H, Bonnielee Haff, MD   insulin aspart (novoLOG) injection 0-15 Units, 0-15 Units, Subcutaneous, TID WC, Bonnielee Haff, MD, 3 Units at 10/25/21 1204   insulin aspart (novoLOG) injection 0-5 Units, 0-5 Units, Subcutaneous, QHS, Bonnielee Haff, MD, 2 Units at 10/24/21 2129   insulin detemir (LEVEMIR) injection 12 Units, 12 Units, Subcutaneous, BID, Bonnielee Haff, MD, 12 Units at 10/25/21 1015   isosorbide dinitrate (ISORDIL) tablet 10 mg, 10 mg, Oral, BID, Bonnielee Haff, MD, 10 mg at 10/25/21 1015   lidocaine (LIDODERM) 5 % 2 patch, 2 patch, Transdermal, Q24H, Candee Furbish, MD, 2 patch at 10/24/21 1600   loratadine (CLARITIN) tablet 10 mg, 10 mg, Oral, Daily, Bonnielee Haff, MD, 10 mg at 10/25/21 1015   MEDLINE mouth rinse, 15 mL, Mouth Rinse, q12n4p, Bonnielee Haff, MD, 15 mL at 10/24/21 1700   metoCLOPramide (REGLAN) tablet 5 mg, 5 mg, Oral, TID AC, Bonnielee Haff, MD, 5 mg at 10/25/21 1204   montelukast (SINGULAIR) tablet 10 mg, 10 mg, Oral, QHS, Olalere, Adewale A, MD, 10 mg at 10/24/21 2130   nitroGLYCERIN (NITROSTAT) SL tablet 0.4 mg, 0.4 mg,  Sublingual, Q5 min PRN, Anders Simmonds, MD   ondansetron [facility]) injection 4 mg, 4 mg, Intravenous, Q6H PRN, Omar Person, NP, 4 mg at 10/23/21 0438   pantoprazole (PROTONIX) EC tablet 40 mg, 40 mg, Oral, BID, Olalere, Adewale A, MD, 40 mg at 10/25/21 1015   phenol (CHLORASEPTIC) mouth spray 1 spray, 1 spray, Mouth/Throat, PRN, Omar Person, NP, 1 spray at 10/07/21 2020   polyethylene glycol (MIRALAX / GLYCOLAX) packet 17 g, 17 g, Oral, Daily PRN, Olalere, Adewale A, MD   QUEtiapine (SEROQUEL) tablet 50 mg, 50 mg, Oral, BID, Olalere, Adewale A, MD, 50 mg at 10/25/21 1015   sodium chloride (OCEAN) 0.65 % nasal spray 1 spray, 1 spray, Each Nare, PRN, Bonnielee Haff, MD, 1 spray at 10/22/21 1557   sodium chloride flush (  NS) 0.9 % injection 10-40 mL, 10-40 mL, Intracatheter, Q12H, Eubanks, Katalina M, NP, 10 mL at 10/25/21 1016  Patients Current Diet:  Diet Order             Diet Carb Modified Fluid consistency: Nectar Thick; Room service appropriate? Yes  Diet effective now                   Precautions / Restrictions Precautions Precautions: Fall, Other (comment) Precaution Comments: trach Restrictions Weight Bearing Restrictions: No   Has the patient had 2 or more falls or a fall with injury in the past year? No  Prior Activity Level Community (5-7x/wk): Pt. went out regularly  Prior Functional Level Self Care: Did the patient need help bathing, dressing, using the toilet or eating? Independent  Indoor Mobility: Did the patient need assistance with walking from room to room (with or without device)? Independent  Stairs: Did the patient need assistance with internal or external stairs (with or without device)? Independent  Functional Cognition: Did the patient need help planning regular tasks such as shopping or remembering to take medications? Independent  Patient Information Are you of Hispanic, Latino/a,or Spanish origin?: A. No, not of Hispanic,  Latino/a, or Spanish origin What is your race?: B. Black or African American Do you need or want an interpreter to communicate with a doctor or health care staff?: 0. No  Patient's Response To:  Health Literacy and Transportation Is the patient able to respond to health literacy and transportation needs?: Yes Health Literacy - How often do you need to have someone help you when you read instructions, pamphlets, or other written material from your doctor or pharmacy?: Never In the past 12 months, has lack of transportation kept you from medical appointments or from getting medications?: No In the past 12 months, has lack of transportation kept you from meetings, work, or from getting things needed for daily living?: No  Home Assistive Devices / Equipment Home Equipment: None  Prior Device Use: Indicate devices/aids used by the patient prior to current illness, exacerbation or injury? None of the above  Current Functional Level Cognition  Overall Cognitive Status: Within Functional Limits for tasks assessed Difficult to assess due to: Tracheostomy Current Attention Level: Selective Orientation Level: Oriented X4 Following Commands: Follows one step commands consistently, Follows one step commands with increased time Safety/Judgement: Decreased awareness of safety General Comments: pt with trach and responding appropriately to all cues/commands and questions    Extremity Assessment (includes Sensation/Coordination)  Upper Extremity Assessment: Overall WFL for tasks assessed  Lower Extremity Assessment: Generalized weakness (able to easy bear weight, but difficulty taking steps) RLE Deficits / Details: MMT scores of 4+ to 5 grossly; hx of peripheral neuropathy; gross incoordination RLE Sensation: history of peripheral neuropathy RLE Coordination: decreased gross motor LLE Deficits / Details: MMT scores of 4+ to 5 grossly; hx of peripheral neuropathy; gross incoordination LLE Sensation:  history of peripheral neuropathy LLE Coordination: decreased gross motor    ADLs  Overall ADL's : Needs assistance/impaired Eating/Feeding: NPO Grooming: Minimal assistance, Sitting, Standing Grooming Details (indicate cue type and reason): Pt stood at sink to wash face and brush teeth with min guard A; sat to do hair cream and combing it out. Upper Body Bathing: Maximal assistance, Sitting Lower Body Bathing: Minimal assistance Lower Body Bathing Details (indicate cue type and reason): able to apply lotion to BLEs, min assist in stand Upper Body Dressing : Minimal assistance, Sitting Upper Body Dressing Details (indicate cue  type and reason): to change gown Lower Body Dressing: Min guard, Sit to/from stand Lower Body Dressing Details (indicate cue type and reason): Pt able to cross her legs to get to her feet while seatd to doff/donn socks and apply lotion Toilet Transfer: Minimal assistance, Stand-pivot, Rolling walker (2 wheels), BSC/3in1 Toilet Transfer Details (indicate cue type and reason): simulated from recliner Toileting- Clothing Manipulation and Hygiene: Moderate assistance, Sit to/from stand Toileting - Clothing Manipulation Details (indicate cue type and reason): for clothing mgmt in standing Functional mobility during ADLs: Minimal assistance, Rolling walker (2 wheels) General ADL Comments: pt limited by decreased activity tolerance and endurance    Mobility  Overal bed mobility: Needs Assistance Bed Mobility: Supine to Sit Rolling: Supervision Sidelying to sit: Min guard, HOB elevated Supine to sit: Min guard, HOB elevated Sit to supine: Min assist General bed mobility comments: pt up in recliner upon my arrival    Transfers  Overall transfer level: Needs assistance Equipment used: None Transfers: Sit to/from Stand Sit to Stand: Min guard Bed to/from chair/wheelchair/BSC transfer type:: Step pivot Step pivot transfers: Min guard General transfer comment: Pt able  to stand up and take 5 small steps to sink from recliner with min guard A and no AD    Ambulation / Gait / Stairs / Wheelchair Mobility  Ambulation/Gait Ambulation/Gait assistance: Counsellor (Feet): 300 Feet Assistive device: Rolling walker (2 wheels) Gait Pattern/deviations: Step-through pattern, Decreased stride length General Gait Details: cues to avoid obstacles on left as pt initially hitting to obstacles but then noted to obviously hit further obstacles with smile and joking. pt with increased endurace with 2 standing rest stops today with SPO2 >93% on 40% FiO2 throughout Gait velocity: Decreased Gait velocity interpretation: 1.31 - 2.62 ft/sec, indicative of limited community ambulator    Posture / Balance Dynamic Sitting Balance Sitting balance - Comments: EOB without physical assist Balance Overall balance assessment: Needs assistance Sitting-balance support: No upper extremity supported, Feet supported Sitting balance-[name]: Good Sitting balance - Comments: EOB without physical assist Postural control: Posterior lean Standing balance support: No upper extremity supported, During functional activity Standing balance-[name]: Fair Standing balance comment: walker and min guard for static standing    Special needs/care consideration Trach size 6 XLT, Skin ***, and Diabetic management ***   Previous Home Environment (from acute therapy documentation) Living Arrangements: Other relatives (sister and her sister's 98 y.o. and 46 y.o. children)  Lives With: Spouse Available Help at Discharge: Family, Available 24 hours/day Type of Home: Mobile home Home Layout: One level Home Access: Stairs to enter Entrance Stairs-Rails: Left (ascending) Entrance Stairs-Number of Steps: 4 Bathroom Shower/Tub: Chiropodist: Standard Bathroom Accessibility: Yes How Accessible: Accessible via walker [place]: No  Discharge Living  Setting Plans for Discharge Living Setting: Patient's home Type of Home at Discharge: Mobile home Discharge Home Layout: One level Discharge Home Access: Stairs to enter Entrance Stairs-Rails: Left Entrance Stairs-Number of Steps: 4 Discharge Bathroom Shower/Tub: Tub/shower unit Discharge Bathroom Toilet: Standard Discharge Bathroom Accessibility: Yes How Accessible: Accessible via walker  [place] Patient Roles: Other (Comment) Contact Information: [phone] Anticipated Caregiver: [name] Anticipated Caregiver's Contact Information: [phone] Ability/Limitations of Caregiver: Can do Min A Caregiver Availability: 24/7 Discharge Plan Discussed with Primary Caregiver: Yes Is Caregiver In Agreement with Plan?: Yes Does Caregiver/Family have Issues with Lodging/Transportation while Pt is in Rehab?: No  Goals Patient/Family Goal for Rehab: PT/OT/SLP  Supervision Expected length of stay: 7-10 days Pt/Family  Agrees to Admission and willing to participate: Yes Program Orientation Provided & Reviewed with Pt/Caregiver Including Roles  & Responsibilities: Yes  Decrease burden of Care through IP rehab admission: Specialzed equipment needs, Decrease number of caregivers, Bowel and bladder program, and Patient/family education  Possible need for SNF placement upon discharge: not anticipated  Patient Condition: I have reviewed medical records from [facility], spoken with CM, and patient. I met with patient at the bedside for inpatient rehabilitation assessment.  Patient will benefit from ongoing PT, OT, and SLP, can actively participate in 3 hours of therapy a day 5 days of the week, and can make measurable gains during the admission.  Patient will also benefit from the coordinated team approach during an Inpatient Acute Rehabilitation admission.  The patient will receive intensive therapy as well as Rehabilitation physician, nursing, social  worker, and care management interventions.  Due to safety, skin/wound care, disease management, medication administration, pain management, and patient education the patient requires 24 hour a day rehabilitation nursing.  The patient is currently *** with mobility and basic ADLs.  Discharge setting and therapy post discharge at home with home health is anticipated.  Patient has agreed to participate in the Acute Inpatient Rehabilitation Program and will admit {Time; today/tomorrow:10263}.  Preadmission Screen Completed By:  Genella Mech, 10/25/2021 1:34 PM ______________________________________________________________________   Discussed status with Dr. Marland Kitchen on *** at *** and received approval for admission today.  Admission Coordinator:  Genella Mech, CCC-SLP, time Marland KitchenSudie Grumbling ***   Assessment/Plan: Diagnosis: Does the need for close, 24 hr/day Medical supervision in concert with the patient's rehab needs make it unreasonable for this patient to be served in a less intensive setting? {yes_no_potentially:3041433} Co-Morbidities requiring supervision/potential complications: *** Due to {due HY:8502774}, does the patient require 24 hr/day rehab nursing? {yes_no_potentially:3041433} Does the patient require coordinated care of a physician, rehab nurse, PT, OT, and SLP to address physical and functional deficits in the context of the above medical diagnosis(es)? {yes_no_potentially:3041433} Addressing deficits in the following areas: {deficits:3041436} Can the patient actively participate in an intensive therapy program of at least 3 hrs of therapy 5 days a week? {yes_no_potentially:3041433} The potential for patient to make measurable gains while on inpatient rehab is {potential:3041437} Anticipated functional outcomes upon discharge from inpatient rehab: {functional outcomes:304600100} PT, {functional outcomes:304600100} OT, {functional outcomes:304600100} SLP Estimated rehab length of stay to reach  the above functional goals is: *** Anticipated discharge destination: {anticipated dc setting:21604} 10. Overall Rehab/Functional Prognosis: {potential:3041437}   MD Signature: ***

## 2021-10-25 NOTE — Progress Notes (Signed)
Physical Therapy Treatment Patient Details Name: Maureen Jones MRN: [id number] DOB: July 12, 1966 Today's Date: 10/25/2021   History of Present Illness 55 y.o. female admitted 09/20/21 with AMS. 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 CXR with low lung volumes, L>R interstitial opacities. Acute metabolic encephalopathy in setting of HTN urgency, possible PRES. ETT (10/10-10/12, 10/12-10/14). Course complicated by confusion/agitation overnight 10/17. 10/25 trach and bronch with return to vent.  11/2 pt passed out while returning from the bathroom pt with respiratory distress from disloged trach leading to PEA arrest with CPR x 10 min. ETT via stoma.  Trach revised 11/2.  PMHx:HTN, DM.    PT Comments    Pt continues to progress well with activity and demonstrates improved cognition and mobility. Pt reports no trach education yet and that between multiple friends she does in fact have 24hr assist at D/C. Continue to recommend CIR for maximizing pt activity tolerance and trach care with pt then able to return home with support. Pt encouraged to be OOB for toileting and meals.  Will continue to follow and aim for stairs next session to start preparing pt for eventual return home. Pt would tolerate 3 hrs of therapy.  SpO2 >93% on 35% trach collar HR 99   Recommendations for follow up therapy are one component of a multi-disciplinary discharge planning process, led by the attending physician.  Recommendations may be updated based on patient status, additional functional criteria and insurance authorization.  Follow Up Recommendations  Acute inpatient rehab (3hours/day)     Assistance Recommended at Discharge Intermittent Supervision/Assistance  Equipment Recommendations  Rolling walker (2 wheels);BSC/3in1    Recommendations for Other Services       Precautions / Restrictions Precautions Precautions: Fall;Other (comment) Precaution Comments: trach Restrictions Weight Bearing Restrictions:  No     Mobility  Bed Mobility Overal bed mobility: Needs Assistance Bed Mobility: Supine to Sit     Supine to sit: Min guard;HOB elevated     General bed mobility comments: HOB 30 degrees with pt able to pivot to left side of bed with guarding for lines    Transfers Overall transfer level: Needs assistance     Sit to Stand: Min guard           General transfer comment: guarding for lines with appropriate hand placement and pt able to complete 5 repeated sit to stands from chair in 20 sec    Ambulation/Gait Ambulation/Gait assistance: Min guard Gait Distance (Feet): 300 Feet Assistive device: Rolling walker (2 wheels) Gait Pattern/deviations: Step-through pattern;Decreased stride length   Gait velocity interpretation: 1.31 - 2.62 ft/sec, indicative of limited community ambulator   General Gait Details: cues to avoid obstacles on left as pt initially hitting to obstacles but then noted to obviously hit further obstacles with smile and joking. pt with increased endurace with 2 standing rest stops today with SPO2 >93% on 40% FiO2 throughout   Stairs             Wheelchair Mobility    Modified Rankin (Stroke Patients Only)       Balance Overall balance assessment: Needs assistance Sitting-balance support: No upper extremity supported;Feet supported Sitting balance-[name]: Good Sitting balance - Comments: EOB without physical assist   Standing balance support: Bilateral upper extremity supported;During functional activity;Reliant on assistive device for balance Standing balance-[name]: Poor Standing balance comment: walker and min guard for static standing  Cognition Arousal/Alertness: Awake/alert Behavior During Therapy: WFL for tasks assessed/performed Overall Cognitive Status: Difficult to assess                                 General Comments: pt with trach and responding appropriately  to all cues/commands and questions        Exercises General Exercises - Lower Extremity Long Arc Quad: AROM;Seated;Both;20 reps Hip Flexion/Marching: AROM;Seated;Both;20 reps    General Comments        Pertinent Vitals/Pain Pain Score: 3  Pain Location: chest Pain Descriptors / Indicators: Sore Pain Intervention(s): Limited activity within patient's tolerance;Monitored during session;Repositioned    Home Living                          Prior Function            PT Goals (current goals can now be found in the care plan section) Acute Rehab PT Goals Time For Goal Achievement: 11/08/21 Potential to Achieve Goals: Good Progress towards PT goals: Progressing toward goals    Frequency    Min 3X/week      PT Plan Current plan remains appropriate    Co-evaluation              AM-PAC PT "6 Clicks" Mobility   Outcome Measure  Help needed turning from your back to your side while in a flat bed without using bedrails?: None Help needed moving from lying on your back to sitting on the side of a flat bed without using bedrails?: A Little Help needed moving to and from a bed to a chair (including a wheelchair)?: A Little Help needed standing up from a chair using your arms (e.g., wheelchair or bedside chair)?: A Little Help needed to walk in hospital room?: A Little Help needed climbing 3-5 steps with a railing? : A Lot 6 Click Score: 18    End of Session Equipment Utilized During Treatment: Oxygen Activity Tolerance: Patient tolerated treatment well Patient left: in chair;with call bell/phone within reach;with chair alarm set Nurse Communication: Mobility status PT Visit Diagnosis: Other abnormalities of gait and mobility (R26.89);Difficulty in walking, not elsewhere classified (R26.2)     Time: 1165-7903 PT Time Calculation (min) (ACUTE ONLY): 25 min  Charges:  $Gait Training: 8-22 mins $Therapeutic Exercise: 8-22 mins                      [place], PT Acute Rehabilitation Services Pager: [phone] Office: [place] 10/25/2021, 10:40 AM

## 2021-10-25 NOTE — Progress Notes (Signed)
Speech Language Pathology Treatment: Dysphagia;Passy Muir Speaking valve  Patient Details Name: Maureen Jones MRN: [id number] DOB: 25-Apr-1966 Today's Date: 10/25/2021 Time: 5277-8242 SLP Time Calculation (min) (ACUTE ONLY): 10 min  Assessment / Plan / Recommendation Clinical Impression  Pt sitting in recliner eating lunch - demonstrates excellent toleration of regular solids and nectar-thick liquids with no obvious deficits.  She communicates well with good over-articulation despite absence of voice.  PMV attempted with placement for one expiration. Still unable to move air through upper airway with resultant backflow of air through trach.  Plan per Jerrye Bushy is to downsize to #5  - this may help with PMV toleration; however, subglottic stenosis- if present- may prohibit valve use.  Will follow-up after trach downsize. D/W pt.   HPI HPI: Pt is a 55 y/o female admitted with AMS 10/10. Stroke w/u negative, dx with acute metabolic encephalopathy in the setting of hypertensive emergency. ETT 10/10-10/12; reintubated 10/12 d/t increased WOB, not mobilizing secretions, stridor, received steroids, extubated 10/14 with lingering stridor, which was improving 10/15. ENT scoped pt 10/18: "Edema of the glottis and subglottic larynx following recent intubation and then repeat intubation.  There is no evidence of cord paralysis or any tumor present.  There is currently no stridor.  Allow more time for resolution of the swelling.  Unfortunately she is at risk for developing subglottic stenosis." SLP followed pt 10/15-10/24. MBS 10/19: trace posterior spillage, mild pharyngeal delay, trace transient penetration; regular texture diet with thin liquids recommended and SLP ultimately signed off with pt demonstrating adequate tolerance. 10/25: Stridor again noted; pt intubated and trach placed.  MBS 10/27: oropharyngeal dysphagia characterized by reduced bolus cohesion, reduced hyolaryngeal elevation, reduced anterior  laryngeal movement, a pharyngeal delay, penetration (PAS 3, 5) with thin liquids. A dyspahgia 3 diet with nectar thick liquids was initiated and she was subsequently advanced to regular and thin liquids on 10/30. Pt with respiratory distress from disloged trach on 11/2 leading to PEA arrest with CPR x 10 min. ETT via stoma. Trach revised 11/2; #6XLT distal.  Cortrak placed 11/4. Trach changed from 6XL distal to 6XL proximal 11/5.      SLP Plan  Continue with current plan of care      Recommendations for follow up therapy are one component of a multi-disciplinary discharge planning process, led by the attending physician.  Recommendations may be updated based on patient status, additional functional criteria and insurance authorization.    Recommendations  Diet recommendations: Regular;Nectar-thick liquid Liquids provided via: Cup;No straw Supervision: Patient able to self feed Compensations: Slow rate;Small sips/bites      Patient may use Passy-Muir Speech Valve: with SLP only         Oral Care Recommendations: Oral care QID Follow Up Recommendations: Acute inpatient rehab (3hours/day) SLP Visit Diagnosis: Dysphagia, pharyngeal phase (R13.13) Plan: Continue with current plan of care       GO              Maureen Jones L. Tivis Ringer, [place] Office number [phone] Pager [phone]   Maureen Jones  10/25/2021, 1:48 PM

## 2021-10-25 NOTE — Progress Notes (Signed)
Called to bedside to assess for trach change. Pt doing well. I know her from pre-intubation and had discussed her case w/ Dr Constance Holster over the phone as she clinically had evidence of upper airway obstruction/ possibly subglottic stenosis and also concern about cord edema.  She now has her trach. Course has been complicated by it becoming dislodged. She is doing well w/ XLT now and is ready to change to cuffless. One clear concern at this point is that she continues to exhibit evidence of airway obstruction as evidenced by intolerance of PMV and fairly significant rush of air on removal of PMV. Also not able to phonate. Plan Will change to proximal cuffless 5 xlt Hope that this will help to PMV tolerance She is not a candidate for decannulation and would NOT go smaller than 4 at this time. My hope is that as an out pt we can refer her to ENT, what we find from there will determine next steps.   Erick Colace ACNP-BC [place] Pager # [phone] OR # [phone] if no answer

## 2021-10-25 NOTE — Progress Notes (Signed)
NAMEAliana Jones, MRN:  [id number], DOB:  12-02-1966, LOS: 11 ADMISSION DATE:  09/20/2021 CONSULTATION DATE: 09/20/2021 REFERRING MD:  Dr. Laverta [place] CHIEF COMPLAINT:  AMS   History of Present Illness:  55 year old woman who presented to [facility] on 10/10 with AMS secondary to hypertensive emergency with hypoxic respiratory failure related to VCD/post intubation subglottic and glottic edema/vocal hoarseness, +/- asthmatic exacerbation.  Ultimately required tracheostomy placement 10/25.   Transferred back to ICU on 11/2 after trach became dislodged on floor, resulting in PEA arrest with CPR x 10 min and 2mg  epi. ROSC obtained. Trach revised to 6.0 XLT Distal 11/2, then to 6.0 XLT Proximal 11/5.  Remains stable from a tracheostomy standpoint.  Pertinent Medical History:  HTN, T2DM   Significant Hospital Events: Including procedures, antibiotic start and stop dates in addition to other pertinent events   10/10 Admit with acute encephalopathy, hypertensive 10/12 extubated, increased WOB, re-intubated 10/14 extubated , lingering stridor, Received racemic epi x2 over the last 24 hours + another dose of Decadron, Mild agitation requiring restart of Precedex 10/15 swallow evaluation >> dysphagia 3 diet 10/20 decompensated after switch to PO steroids 10/21 better after CAT and back on IV steroids 10/22 agitated back on  BIPAP 10/25 stridor again. Marked work of breathing. Intubated, airway noises, stridor and wheezing completely resolved post intubation. Spoke to ENT who will follow after trach. Trach placed by Dr Vaughan Browner. Seroquel increased to 100 qhs, changes solumedrol to prednisone to begin taper, rested on propofol over night. Tubefeeds started. RUE PICC placed  10/26 prop weaning off attempting ATC  11/2 PEA arrest in setting of dislodged tracheostomy. questionable syncope.  Total 10 minutes CPR. Transferred to intensive care unit intubated via trach stoma for redo trach 11/2/202.  Redo  tracheostomy. 6 XLT. 11/4 Concern overnight for STEMI, hep gtt started, cards consulted; this was artifact 11/5 changed out distal XLT for proximal due to positioning within trachea 11/8 tolerated trach collar for about 6 hours yesterday 11/7, placed back on vent  11/10 did not require vent overnight  11/14 Stable from tracheostomy standpoint, remains on ATC 8L FiO2 35%. Ongoing TOC discussion re: placement, not a good candidate for CIR due to unavailable 24H care post-CIR discharge.  Interim History / Subjective:  Feeling well overall this morning In good spirits Nausea/vomiting continues to be improved Tolerating ATC without issues Trach stable, no associated pain, thin clear secretions - she states she's able to clear these easily Awaiting TOC meeting/care plan discussion  Objective:  Blood pressure (!) 181/85, pulse 93, temperature 98.6 F (37 C), temperature source Oral, resp. rate (!) 27, height 5\' 6"  (1.676 m), weight 100 kg, SpO2 96 %.    FiO2 (%):  [28 %-36 %] 35 %   Intake/Output Summary (Last 24 hours) at 10/25/2021 0825 Last data filed at 10/25/2021 0500 Gross per 24 hour  Intake --  Output 300 ml  Net -300 ml    Filed Weights   10/21/21 0459 10/24/21 0500 10/25/21 0500  Weight: 101.6 kg 100 kg 100 kg   Physical Examination: General: Acutely ill-appearing middle-aged woman in NAD. HEENT: [place]/AT, anicteric sclera, PERRL, moist mucous membranes. Tracheostomy in place. Neuro: Awake, oriented x 4. Responds to verbal stimuli. Following commands consistently. Moves all 4 extremities spontaneously. CV: RRR, no m/g/r. PULM: Breathing even and unlabored on ATC (8L, FiO2 35%). Lung fields CTAB. GI: Soft, nontender, nondistended. Normoactive bowel sounds. Extremities: No LE edema noted. Skin: Warm/dry, no rashes.  Resolved Hospital Problem  List:   RML/RLL PNA (treated) Hypertensive emergency   Assessment & Plan:   Acute hypoxemic respiratory failure history of vocal  cord dysfunction Subglottic and glottic edema Status post tracheostomy 10/25. Tracheostomy dislodgment 11/2 led to PEA arrest; required CPR x 10 minutes. Changed to 6.0 Proximal XLT 11/5.  - Continues to tolerate ATC without issue - Wean FiO2 for O2 sat > 90% - Pulmonary hygiene - Continue to mobilize - working with therapies, able to ambulate/complete ADLS with OT - TOC discussion pending today, 11/14 re: discharge/rehab plan - Stable for transfer to progressive vs. floor bed, when available  Best Practice: (right click and "Reselect all SmartList Selections" daily)   Diet/type: tubefeeds DVT prophylaxis: heparin subQ GI prophylaxis: PPI Lines: PICC Foley:  N/A Code Status:  full code Last date of multidisciplinary goals of care discussion [pending]  Critical care time: N/A   Rhae Lerner [facility] 10/25/21 8:25 AM  Please see [email or web address] for pager details.  From 7A-7P if no response, please call [phone] After hours, please call ELink [phone]

## 2021-10-25 NOTE — Procedures (Signed)
Tracheostomy Exchange Procedure Note  Vernel Donlan  [id number]  [birth year]  Date:10/25/21  Time:2:43 PM   Provider Performing:Pete E Kary Kos   Procedure: Tracheostomy Exchange Through mature Stoma [phone])  Indication(s) Facilitate phonation   Consent Risks of the procedure as well as the alternatives and risks of each were explained to the patient and/or caregiver.  Consent for the procedure was obtained and is signed in the bedside chart  Anesthesia None   Time Out Verified patient identification, verified procedure, site/side was marked, verified correct patient position, special equipment/implants available, medications/allergies/relevant history reviewed, required imaging and test results available.   Sterile Technique Hand hygiene, gloves   Procedure Description Size 6 cuffed existing prox XLT Shiley removed and size 5 uncuffed prox  XLT Shiley placed through stoma.   Complications/Tolerance None; patient tolerated the procedure well..   EBL Minimal

## 2021-10-26 DIAGNOSIS — J9601 Acute respiratory failure with hypoxia: Secondary | ICD-10-CM | POA: Diagnosis not present

## 2021-10-26 DIAGNOSIS — E1143 Type 2 diabetes mellitus with diabetic autonomic (poly)neuropathy: Secondary | ICD-10-CM | POA: Diagnosis not present

## 2021-10-26 DIAGNOSIS — K3184 Gastroparesis: Secondary | ICD-10-CM | POA: Diagnosis not present

## 2021-10-26 DIAGNOSIS — I10 Essential (primary) hypertension: Secondary | ICD-10-CM | POA: Diagnosis not present

## 2021-10-26 LAB — GLUCOSE, CAPILLARY
Glucose-Capillary: 153 mg/dL — ABNORMAL HIGH (ref 70–99)
Glucose-Capillary: 268 mg/dL — ABNORMAL HIGH (ref 70–99)
Glucose-Capillary: 174 mg/dL — ABNORMAL HIGH (ref 70–99)
Glucose-Capillary: 181 mg/dL — ABNORMAL HIGH (ref 70–99)

## 2021-10-26 NOTE — Progress Notes (Signed)
[facility] PROGRESS NOTE   Maureen Jones XTK:[id number] DOB: 07/08/1966 DOA: 09/20/2021  PCP: Pcp, No  Brief History/Interval Summary: 55 y/o F who presented to [facility] on 10/10 with reports of acute altered mental status secondary to hypertensive emergency with hypoxic respiratory failure related to VCD/post intubation subglottic and glottic edema/vocal hoarseness  +/- asthmatic exacerbation.  Ultimately required trach placement. Transferred back to ICU on 11/2 after trach became dislodged on floor, resulting in PEA arrest with 10 min CPR and 2mg  epi. ROSC obtained. Trach revised to 6.0 XLT.   Reason for Visit: Acute respiratory failure with hypoxia  Consultants: Critical care medicine.  Significant Hospital Events: Including procedures, antibiotic start and stop dates in addition to other pertinent events   10/10 Admit with acute encephalopathy, hypertensive 10/12 extubated, increased WOB, re-intubated 10/14 extubated , lingering stridor, Received racemic epi x2 over the last 24 hours + another dose of Decadron, Mild agitation requiring restart of Precedex 10/15 swallow evaluation >> dysphagia 3 diet 10/20 decompensated after switch to PO steroids 10/21 better after CAT and back on IV steroids 10/22 agitated back on  BIPAP 10/25 stridor again. Marked work of breathing. Intubated, airway noises, stridor and wheezing completely resolved post intubation. Spoke to ENT who will follow after trach. Trach placed by Dr Vaughan Browner. Seroquel increased to 100 qhs, changes solumedrol to prednisone to begin taper, rested on propofol over night. Tubefeeds started. RUE PICC placed  10/26 prop weaning off attempting ATC  11/2 PEA arrest in setting of dislodged tracheostomy. questionable syncope.  Total 10 minutes CPR. Transferred to intensive care unit intubated via trach stoma for redo trach 11/2/202.  Redo tracheostomy. 6 XLT. 11/4 Concern overnight for STEMI, hep gtt started, cards consulted;  this was artifact 11/5 changed out distal XLT for proximal due to positioning within trachea 11/8 tolerated trach collar for about 6 hours 11/7, placed back on vent  11/14: Tracheostomy tube was changed from size 6 cuffed to size 5 uncuffed proximal XLT [place]     Subjective/Interval History: Patient sitting up in the chair eating her breakfast.  Feels well.  Denies any shortness of breath.  No further episodes of nausea or vomiting.  Seems to be quite happy with her progress.  Assessment/Plan:  Acute respiratory failure with hypoxia with history of vocal cord dysfunction/subglottic and glottic edema She is status post tracheostomy.  She had dislodgment of his tracheostomy on 11/2 leading to PEA arrest.  She required CPR for 10 minutes following which she had ROSC. Respiratory status is stable.  Has not required ventilator for last several days.  Stable on trach collar. Seen by pulmonology yesterday.  Tracheostomy was changed over to size 5 uncuffed.  Patient tolerated this well.  Tolerated PMV yesterday.    Essential hypertension Noted to be on hydralazine, nitrates, carvedilol.  Dose of hydralazine and carvedilol have been adjusted in the last 48 hours.  Some improvement in blood pressures noted.  Continue to monitor trends over the next 24 hours before deciding on further changes.    Diabetes mellitus type 2, uncontrolled with hyperglycemia HbA1c 12.7.  Due to borderline low glucose levels dose of Levemir was adjusted the last few days.  CBGs have stabilized.  Continue to monitor.    Diabetic gastroparesis Abdominal x-ray did not show any obstruction.  Lipase level as well as LFTs were normal.   Symptoms suggesting diabetic gastroparesis.  Symptoms have improved with the Reglan.  It was changed over to oral route.  Continue PPI as  well.   Chronic kidney disease stage IIIb Presented with elevated creatinine.  No old labs in our system.  Care everywhere was reviewed.  She had a  creatinine of 1.7 last year.  Likely has chronic kidney disease.  Renal function stable for the most part.  Monitor urine output.  Avoid nephrotoxic agents.     Healthcare associated pneumonia Completed 7 days of antibiotics.  Stable.  Agitation Seems to have resolved.  Continue to wean down clonazepam as well as Seroquel gradually.   Normocytic anemia Likely anemia of critical illness.  No evidence of overt bleeding.  Anemia panel does not show any clear-cut deficiencies.  Hypomagnesemia and hypophosphatemia Stable as of yesterday.  Recheck labs tomorrow.  Dysphagia/nutrition Secondary to tracheostomy.  Seen by speech therapy.  No longer on tube feedings.  Tolerating her diet.  She is on regular diet with nectar thick liquids.  Obesity Estimated body mass index is 35.51 kg/m as calculated from the following:   Height as of this encounter: 5\' 6"  (1.676 m).   Weight as of this encounter: 99.8 kg.    DVT Prophylaxis: Subcutaneous heparin Code Status: Partial code Family Communication: Discussed with patient. Disposition Plan: It appears that she is being considered for inpatient rehabilitation versus SNF.  Will need to know from pulmonology as to when she can be safely discharged.  Status is: Inpatient  Remains inpatient appropriate because: Acute respiratory failure requiring mechanical ventilation on and off      Medications: Scheduled:  arformoterol  15 mcg Nebulization BID   aspirin  81 mg Oral Daily   budesonide (PULMICORT) nebulizer solution  0.25 mg Nebulization BID   carvedilol  12.5 mg Oral BID   chlorhexidine  15 mL Mouth Rinse BID   Chlorhexidine Gluconate Cloth  6 each Topical Daily   clonazePAM  0.5 mg Oral BID   DULoxetine  60 mg Oral Daily   fluticasone  2 spray Each Nare Daily   heparin injection (subcutaneous)  5,000 Units Subcutaneous Q8H   hydrALAZINE  100 mg Oral Q8H   insulin aspart  0-15 Units Subcutaneous TID WC   insulin aspart  0-5 Units  Subcutaneous QHS   insulin detemir  12 Units Subcutaneous BID   isosorbide dinitrate  10 mg Oral BID   lidocaine  2 patch Transdermal Q24H   loratadine  10 mg Oral Daily   mouth rinse  15 mL Mouth Rinse q12n4p   metoCLOPramide  5 mg Oral TID AC   montelukast  10 mg Oral QHS   pantoprazole  40 mg Oral BID   QUEtiapine  50 mg Oral BID   sodium chloride flush  10-40 mL Intracatheter Q12H   Continuous:  sodium chloride Stopped (10/21/21 0722)   IWL:NLGXQJ chloride, acetaminophen, acetaminophen, albuterol, clonazePAM, dextrose, docusate sodium, guaiFENesin, hydrALAZINE, nitroGLYCERIN, ondansetron (ZOFRAN) IV, phenol, polyethylene glycol, sodium chloride  Antibiotics: Anti-infectives (From admission, onward)    Start     Dose/Rate Route Frequency Ordered Stop   10/18/21 0830  ceFEPIme (MAXIPIME) 2 g in sodium chloride 0.9 % 100 mL IVPB  Status:  Discontinued        2 g 200 mL/hr over 30 Minutes Intravenous Every 24 hours 10/17/21 1055 10/17/21 1056   10/17/21 2200  ceFEPIme (MAXIPIME) 2 g in sodium chloride 0.9 % 100 mL IVPB        2 g 200 mL/hr over 30 Minutes Intravenous Every 12 hours 10/17/21 1056 10/18/21 2145   10/14/21 0830  ceFEPIme (MAXIPIME) 2  g in sodium chloride 0.9 % 100 mL IVPB  Status:  Discontinued        2 g 200 mL/hr over 30 Minutes Intravenous Every 24 hours 10/14/21 0721 10/17/21 1055   10/14/21 0215  piperacillin-tazobactam (ZOSYN) IVPB 3.375 g        3.375 g 100 mL/hr over 30 Minutes Intravenous  Once 10/14/21 0126 10/14/21 0302   10/04/21 1100  vancomycin (VANCOREADY) IVPB 1250 mg/250 mL  Status:  Discontinued        1,250 mg 166.7 mL/hr over 90 Minutes Intravenous Every 48 hours 10/02/21 0939 10/03/21 1508   10/02/21 2200  ceFEPIme (MAXIPIME) 2 g in sodium chloride 0.9 % 100 mL IVPB  Status:  Discontinued        2 g 200 mL/hr over 30 Minutes Intravenous Every 12 hours 10/02/21 0948 10/04/21 1010   10/02/21 1030  vancomycin (VANCOREADY) IVPB 2000 mg/400 mL         2,000 mg 200 mL/hr over 120 Minutes Intravenous  Once 10/02/21 0939 10/02/21 1324   09/27/21 0900  ceFEPIme (MAXIPIME) 2 g in sodium chloride 0.9 % 100 mL IVPB  Status:  Discontinued        2 g 200 mL/hr over 30 Minutes Intravenous Every 12 hours 09/27/21 0800 10/02/21 0948   09/27/21 0900  vancomycin (VANCOREADY) IVPB 1500 mg/300 mL  Status:  Discontinued        1,500 mg 150 mL/hr over 120 Minutes Intravenous Every 48 hours 09/27/21 0800 09/28/21 1058   09/21/21 0800  cefTRIAXone (ROCEPHIN) 2 g in sodium chloride 0.9 % 100 mL IVPB        2 g 200 mL/hr over 30 Minutes Intravenous Every 24 hours 09/21/21 0742 09/25/21 0808   09/20/21 0945  cefTRIAXone (ROCEPHIN) 2 g in sodium chloride 0.9 % 100 mL IVPB  Status:  Discontinued        2 g 200 mL/hr over 30 Minutes Intravenous Every 24 hours 09/20/21 0936 09/21/21 0742       Objective:  Vital Signs  Vitals:   10/26/21 0713 10/26/21 0748 10/26/21 0755 10/26/21 0800  BP:   (!) 180/86 (!) 175/97  Pulse: 88  96 94  Resp: 20   (!) 23  Temp:  98.2 F (36.8 C)    TempSrc:  Axillary    SpO2: 95%   96%  Weight:      Height:        Intake/Output Summary (Last 24 hours) at 10/26/2021 1038 Last data filed at 10/25/2021 1715 Gross per 24 hour  Intake 720 ml  Output --  Net 720 ml    Filed Weights   10/24/21 0500 10/25/21 0500 10/26/21 0500  Weight: 100 kg 100 kg 99.8 kg    General appearance: Awake alert.  In no distress Tracheostomy is noted Resp: Normal effort at rest.  Coarse breath sounds bilaterally.  No wheezing or rhonchi. Cardio: S1-S2 is normal regular.  No S3-S4.  No rubs murmurs or bruit GI: Abdomen is soft.  Nontender nondistended.  Bowel sounds are present normal.  No masses organomegaly Extremities: No edema.  Full range of motion of lower extremities. Neurologic: Alert and oriented x3.  No focal neurological deficits.       Lab Results:  Data Reviewed: I have personally reviewed following labs and imaging  studies  CBC: Recent Labs  Lab 10/20/21 0411 10/21/21 0506 10/23/21 0145 10/25/21 0415  WBC 12.0* 11.2* 11.7* 11.3*  NEUTROABS 8.9*  --   --   --  HGB 7.9* 7.7* 8.0* 7.8*  HCT 27.3* 26.9* 26.7* 26.6*  MCV 98.6 98.5 96.0 97.1  PLT 330 363 350 317     Basic Metabolic Panel: Recent Labs  Lab 10/20/21 0411 10/20/21 2040 10/21/21 0506 10/23/21 0145 10/25/21 0415  NA 145  --  142 138 138  K 4.6  --  4.0 3.9 3.9  CL 109  --  107 103 103  CO2 28  --  28 27 27   GLUCOSE 197*  --  147* 87 159*  BUN 36*  --  34* 23* 31*  CREATININE 1.79*  --  2.00* 1.95* 2.05*  CALCIUM 10.1  --  10.0 9.8 9.0  MG 1.6* 2.5* 2.3 1.7 1.8  PHOS 2.3*  --  3.6  --  3.6     GFR: Estimated Creatinine Clearance: 37.4 mL/min (A) (by C-G formula based on SCr of 2.05 mg/dL (H)).  Liver Function Tests: Recent Labs  Lab 10/20/21 0411 10/21/21 0506 10/23/21 0145 10/25/21 0415  AST 13* 13* 14* 19  ALT 23 21 16 17   ALKPHOS 125 129* 109 119  BILITOT 0.7 0.3 0.5 0.4  PROT 6.7 6.2* 5.9* 5.5*  ALBUMIN 1.9* 1.9* 2.1* 2.1*     Recent Labs  Lab 10/20/21 0411  LIPASE 47     CBG: Recent Labs  Lab 10/25/21 0723 10/25/21 1137 10/25/21 1601 10/25/21 2033 10/26/21 0747  GLUCAP 121* 172* 181* 204* 153*      Recent Results (from the past 240 hour(s))  Culture, Respiratory w Gram Stain     Status: None   Collection Time: 10/16/21 12:33 PM   Specimen: Tracheal Aspirate; Respiratory  Result Value Ref Range Status   Specimen Description TRACHEAL ASPIRATE  Final   Special Requests NONE  Final   Gram Stain   Final    FEW SQUAMOUS EPITHELIAL CELLS PRESENT MODERATE WBC SEEN FEW GRAM POSITIVE COCCI    Culture   Final    RARE Normal respiratory flora-no Staph aureus or Pseudomonas seen Performed at [facility], [address] [place], [place] [address]    Report Status 10/18/2021 FINAL  Final       Radiology Studies: No results found.     LOS: 36 days   Halena Mohar  [employer] on [email or web address]  10/26/2021, 10:38 AM

## 2021-10-26 NOTE — TOC Progression Note (Addendum)
Transition of Care [facility]) - Progression Note    Patient Details  Name: Maureen Jones MRN: [id number] Date of Birth: [birth year]  Transition of Care [facility]) CM/SW Contact  [name], [name], RN Phone Number: 10/26/2021, 12:30 PM  Clinical Narrative:    CM spoke with patient and friend/sister Maureen Jones at bedside about discharge disposition. [name] told CM that she is concerned that patient will be going home at discharge and she cannot care for her. CM explained to [name] the options patient has in regards to discharge disposition. [name] states patient wants to go home and she is not stable enough to go home. CM explained to [name] that if patient is to discharge home, she will be going home with home health PT/OT to do therapy to help her regain her strength. CM explained to her that Therapy will only be there for 2-3 days a week and they will call her to schedule time. [name] voiced understanding. She also voiced concern that when she goes to her appointments or church, patient will be left alone at home. CM also informed her that if an Aide is referred, they will also come in 2-3 days a week and stay not longer than an hour. [name] voiced understanding. CM will continue to follow with needs.    Expected Discharge Plan: [place] Barriers to Discharge: Continued Medical Work up  Expected Discharge Plan and Services Expected Discharge Plan: [place]   Discharge Planning Services: CM Consult Post Acute Care Choice: [place] arrangements for the past 2 months: Mobile Home                           HH Arranged: PT, OT, Speech Therapy HH Agency: [place] Date [place]: 09/29/21 Time [place]: 1620 Representative spoke with at [place]: [place] ([place]) Interventions    Readmission Risk Interventions No flowsheet data found.

## 2021-10-26 NOTE — Progress Notes (Signed)
Inpatient Rehab Admissions Coordinator:   I do not have insurance auth or a bed for this Pt. On CIR. Insurance is requesting peer to peer today. MD notified. Pt. States she would like to appeal if insurance denies.   Clemens Catholic, [place], [place] Admissions Coordinator  [phone] ([place]) [phone] (office)

## 2021-10-26 NOTE — Progress Notes (Addendum)
Nutrition Follow-up  DOCUMENTATION CODES:   Obesity unspecified  INTERVENTION:  - Vital Cuisine Shake TID, each supplement provides 520 kcal and 22 grams of protein  - Encourage PO intake  NUTRITION DIAGNOSIS:   Inadequate oral intake related to inability to eat as evidenced by NPO status.  Progressing, needs being met through PO intake and nutrition supplements  GOAL:   Patient will meet greater than or equal to 90% of their needs  Progressing  MONITOR:   PO intake, Supplement acceptance, Diet advancement, Vent status, Labs, Weight trends, I & O's  REASON FOR ASSESSMENT:   Ventilator, Consult Enteral/tube feeding initiation and management  ASSESSMENT:   Pt with PMH of DM, HTN, and smoking admitted with acute encephalopathy.  10/10 - admit, intubated 10/12 - extubated and re-intubated 10/14 - extubated  10/15 - dysphagia 3 diet s/p swallow eval 10/22 - returned to BiPAP 10/25 - re-intubated, trach 10/26 - Cortrak placed (gastric tip confirmed via x-ray) 11/02 - Code Blue due to malpositioning of trach, s/p re-do trach 11/03 - NG tube placed (tip in stomach) 11/04 - Cortrak placed (tip in stomach) 11/08 - MBS, diet advanced to carb modified with nectar-thick liquids, Cortrak dislodged d/t N/V and removed  Pt sitting on edge of bed during visit. She denies n/v and reports doing well. Limited but sporadic meal completions noted. 11/9 noted 0% meal completion x3 meals. 11/14 noted 75-90% meal completion x2 recorded meals. Observed 100% of breakfast tray eaten. Pt had fruit and cottage cheese plate, nectar thick cran juice, lemon water and hormel shake. Although she does not like the hormel shakes, she understands the importance of drinking them to help ensure adequate nutritional needs are being met and will continue to drink them.   Admit weight: 100 kg Current weight: 99.8 kg  Pt with mild pitting edema to BLE.  Medications: SSI, levemir 12 units BID, reglan,  protonix  Labs (11/14): BUN 31, Cr 2.05  UOP: 785mL x 12 hours I/O's: -1712mL since admission  Diet Order:   Diet Order             Diet Carb Modified Fluid consistency: Nectar Thick; Room service appropriate? Yes  Diet effective now                   EDUCATION NEEDS:   Not appropriate for education at this time  Skin:  Skin Assessment: Reviewed RN Assessment  Last BM:  10/26/21  Height:   Ht Readings from Last 1 Encounters:  10/05/21 $RemoveB'5\' 6"'lXDGlTkm$  (1.676 m)    Weight:   Wt Readings from Last 1 Encounters:  10/26/21 99.8 kg    Ideal Body Weight:  59.1 kg  BMI:  Body mass index is 35.51 kg/m.  Estimated Nutritional Needs:   Kcal:  1900-2100  Protein:  95-105g  Fluid:  >1.9L  Clayborne Dana, RDN, LDN Clinical Nutrition

## 2021-10-26 NOTE — Progress Notes (Signed)
Speech Language Pathology Treatment: [name] Speaking valve  Patient Details Name: Maureen Jones MRN: [id number] DOB: 1966/03/28 Today's Date: 10/26/2021 Time: 1696-7893 SLP Time Calculation (min) (ACUTE ONLY): 30 min  Assessment / Plan / Recommendation Clinical Impression  Pt sitting EOB, RN present.  Trach downsized yesterday to #5 XL cuffless  - wore PMV last night with success.  Placed valve this am and pt was able to access upper airway without difficulty. Voice was strong - pt described it as "raspy," but with good volume and normal intelligibility.  Used valve for 20 minutes with excellent toleration and no variability in vital signs.  Pt was taught how to donn/doff valve with bedside mirror, and after multiple trials was able to complete task independently.  We reviewed PMV video so that she could better understand the mechanics of a trach tube and the way the PMV functions. She was encouraged to place the valve when caregivers are present; to remove when she is going to sleep.  Excellent session today!  Please provide intermittent supervision with valve and allow pt to place/remove valve by herself with initial supervision to secure inner cannula.  D/W RN.    HPI HPI: Pt is a 55 y/o female admitted with AMS 10/10. Stroke w/u negative, dx with acute metabolic encephalopathy in the setting of hypertensive emergency. ETT 10/10-10/12; reintubated 10/12 d/t increased WOB, not mobilizing secretions, stridor, received steroids, extubated 10/14 with lingering stridor, which was improving 10/15. ENT scoped pt 10/18: "Edema of the glottis and subglottic larynx following recent intubation and then repeat intubation.  There is no evidence of cord paralysis or any tumor present.  There is currently no stridor.  Allow more time for resolution of the swelling.  Unfortunately she is at risk for developing subglottic stenosis." SLP followed pt 10/15-10/24. MBS 10/19: trace posterior spillage, mild  pharyngeal delay, trace transient penetration; regular texture diet with thin liquids recommended and SLP ultimately signed off with pt demonstrating adequate tolerance. 10/25: Stridor again noted; pt intubated and trach placed.  MBS 10/27: oropharyngeal dysphagia characterized by reduced bolus cohesion, reduced hyolaryngeal elevation, reduced anterior laryngeal movement, a pharyngeal delay, penetration (PAS 3, 5) with thin liquids. A dyspahgia 3 diet with nectar thick liquids was initiated and she was subsequently advanced to regular and thin liquids on 10/30. Pt with respiratory distress from disloged trach on 11/2 leading to PEA arrest with CPR x 10 min. ETT via stoma. Trach revised 11/2; #6XLT distal.  Cortrak placed 11/4. Trach changed from 6XL distal to 6XL proximal 11/5. Trach downsized to 5 cuffless 11/15; much improved toleration PMV.      SLP Plan  Continue with current plan of care      Recommendations for follow up therapy are one component of a multi-disciplinary discharge planning process, led by the attending physician.  Recommendations may be updated based on patient status, additional functional criteria and insurance authorization.    Recommendations  Diet recommendations: Regular;Nectar-thick liquid Liquids provided via: Cup;No straw Medication Administration: Whole meds with liquid Supervision: Patient able to self feed Compensations: Slow rate Postural Changes and/or Swallow Maneuvers: Seated upright 90 degrees      Patient may use Passy-Muir Speech Valve: Intermittently with supervision;During all waking hours (remove during sleep);During all therapies with supervision PMSV Supervision: Intermittent         Oral Care Recommendations: Oral care BID Follow Up Recommendations: Acute inpatient rehab (3hours/day) Assistance recommended at discharge: PRN SLP Visit Diagnosis: Aphonia (R49.1) Plan: Continue with current plan of  care       [place].  Tivis Ringer, [place] CCC/SLP Acute Rehabilitation Services Office number [phone] Pager [phone]   Juan Quam Laurice  10/26/2021, 9:51 AM

## 2021-10-27 DIAGNOSIS — G9340 Encephalopathy, unspecified: Secondary | ICD-10-CM | POA: Diagnosis not present

## 2021-10-27 DIAGNOSIS — Z978 Presence of other specified devices: Secondary | ICD-10-CM | POA: Diagnosis not present

## 2021-10-27 LAB — CBC
HCT: 27 % — ABNORMAL LOW (ref 36.0–46.0)
Hemoglobin: 8.1 g/dL — ABNORMAL LOW (ref 12.0–15.0)
MCH: 28.7 pg (ref 26.0–34.0)
MCHC: 30 g/dL (ref 30.0–36.0)
MCV: 95.7 fL (ref 80.0–100.0)
Platelets: 321 10*3/uL (ref 150–400)
RBC: 2.82 MIL/uL — ABNORMAL LOW (ref 3.87–5.11)
RDW: 15.1 % (ref 11.5–15.5)
WBC: 10.4 10*3/uL (ref 4.0–10.5)
nRBC: 0 % (ref 0.0–0.2)

## 2021-10-27 LAB — BASIC METABOLIC PANEL
Anion gap: 8 (ref 5–15)
BUN: 33 mg/dL — ABNORMAL HIGH (ref 6–20)
CO2: 28 mmol/L (ref 22–32)
Calcium: 9.6 mg/dL (ref 8.9–10.3)
Chloride: 101 mmol/L (ref 98–111)
Creatinine, Ser: 2.03 mg/dL — ABNORMAL HIGH (ref 0.44–1.00)
GFR, Estimated: 29 mL/min — ABNORMAL LOW (ref >60)
Glucose, Bld: 228 mg/dL — ABNORMAL HIGH (ref 70–99)
Potassium: 4.1 mmol/L (ref 3.5–5.1)
Sodium: 137 mmol/L (ref 135–145)

## 2021-10-27 LAB — GLUCOSE, CAPILLARY
Glucose-Capillary: 247 mg/dL — ABNORMAL HIGH (ref 70–99)
Glucose-Capillary: 264 mg/dL — ABNORMAL HIGH (ref 70–99)
Glucose-Capillary: 294 mg/dL — ABNORMAL HIGH (ref 70–99)
Glucose-Capillary: 235 mg/dL — ABNORMAL HIGH (ref 70–99)

## 2021-10-27 LAB — MAGNESIUM: Magnesium: 1.6 mg/dL — ABNORMAL LOW (ref 1.7–2.4)

## 2021-10-27 MED ORDER — MAGNESIUM SULFATE 2 GM/50ML IV SOLN
2.0000 g | Freq: Once | INTRAVENOUS | Status: AC
  Administered 2021-10-27: 2 g via INTRAVENOUS
  Filled 2021-10-27: qty 50

## 2021-10-27 MED ORDER — AMLODIPINE BESYLATE 5 MG PO TABS
5.0000 mg | ORAL_TABLET | Freq: Every day | ORAL | Status: DC
  Administered 2021-10-27 – 2021-10-31 (×5): 5 mg via ORAL
  Filled 2021-10-27 (×5): qty 1

## 2021-10-27 NOTE — Progress Notes (Signed)
Inpatient Rehab Admissions Coordinator:   I do not have a CIR bed for this Pt. Today. I received a denial from Pt.'s insurance yesterday and began expedited appeal. I will continue to follow for potential admit.  Clemens Catholic, [place], [place] Admissions Coordinator  [phone] ([place]) [phone] (office)

## 2021-10-27 NOTE — Progress Notes (Addendum)
Speech Language Pathology Treatment: Dysphagia;Passy Muir Speaking valve  Patient Details Name: Maureen Jones MRN: [id number] DOB: 03/05/1966 Today's Date: 10/27/2021 Time: 8657-8469 SLP Time Calculation (min) (ACUTE ONLY): 25 min  Assessment / Plan / Recommendation Clinical Impression  Pt was seen for treatment. She was alert and cooperative during the session. PMSV was on when SLP arrived and pt was observed using while ambulating with PT. Pt tolerated PMSV for the duration of the session with stable vitals and no signs of respiratory distress. Vocal quality was improved compared to when she was last able to consistently tolerate the PMSV for all waking hours. Pt independently demonstrated donning and doffing for PMSV and she stated that she has been removing it for rest. Unthickened liquid was noted at bedside. Pt reported that it was brought by staff yesterday, but that she did not have any; per the pt, "I ain't trying to get sick". Pt consumed a meal of french toast, sausage, and trials of thin liquids via cup and straw. She tolerated all solids and consecutive swallows of thin liquids via cup without overt s/sx of aspiration. A slight cough was noted once with thin liquids via straw and pt expressed, "I don't like it; I feel like I don't have control". Pt's diet will be advanced to regular texture solids and thin liquids via cup. Pt may continue to use PMSV during all waking hours. SLP will continue to follow pt.    HPI HPI: Pt is a 55 y/o female admitted with AMS 10/10. Stroke w/u negative, dx with acute metabolic encephalopathy in the setting of hypertensive emergency. ETT 10/10-10/12; reintubated 10/12 d/t increased WOB, not mobilizing secretions, stridor, received steroids, extubated 10/14 with lingering stridor, which was improving 10/15. ENT scoped pt 10/18: "Edema of the glottis and subglottic larynx following recent intubation and then repeat intubation.  There is no evidence of cord  paralysis or any tumor present.  There is currently no stridor.  Allow more time for resolution of the swelling.  Unfortunately she is at risk for developing subglottic stenosis." SLP followed pt 10/15-10/24. MBS 10/19: trace posterior spillage, mild pharyngeal delay, trace transient penetration; regular texture diet with thin liquids recommended and SLP ultimately signed off with pt demonstrating adequate tolerance. 10/25: Stridor again noted; pt intubated and trach placed.  MBS 10/27: oropharyngeal dysphagia characterized by reduced bolus cohesion, reduced hyolaryngeal elevation, reduced anterior laryngeal movement, a pharyngeal delay, penetration (PAS 3, 5) with thin liquids. A dyspahgia 3 diet with nectar thick liquids was initiated and she was subsequently advanced to regular and thin liquids on 10/30. Pt with respiratory distress from disloged trach on 11/2 leading to PEA arrest with CPR x 10 min. ETT via stoma. Trach revised 11/2; #6XLT distal.  Cortrak placed 11/4. Trach changed from 6XL distal to 6XL proximal 11/5.      SLP Plan  Continue with current plan of care      Recommendations for follow up therapy are one component of a multi-disciplinary discharge planning process, led by the attending physician.  Recommendations may be updated based on patient status, additional functional criteria and insurance authorization.    Recommendations  Diet recommendations: Regular;Thin liquid Liquids provided via: Cup;No straw Medication Administration: Whole meds with puree Supervision: Patient able to self feed Compensations: Slow rate;Small sips/bites      Patient may use Passy-Muir Speech Valve: During all waking hours (remove during sleep) PMSV Supervision: Intermittent         Oral Care Recommendations: Oral care BID Follow  Up Recommendations: Acute inpatient rehab (3hours/day) Assistance recommended at discharge: PRN SLP Visit Diagnosis: Dysphagia, pharyngeal phase (R13.13) Plan:  Continue with current plan of care       Darleene Cumpian I. Hardin Negus, [place], [place] Office number [phone] Pager [phone] \                [name]  10/27/2021, 11:11 AM

## 2021-10-27 NOTE — Progress Notes (Signed)
Occupational Therapy Treatment Patient Details Name: Maureen Jones MRN: [id number] DOB: 10/13/1966 Today's Date: 10/27/2021   History of present illness 55 y.o. female admitted 09/20/21 with AMS. 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 CXR with low lung volumes, L>R interstitial opacities. Acute metabolic encephalopathy in setting of HTN urgency, possible PRES. ETT (10/10-10/12, 10/12-10/14). Course complicated by confusion/agitation overnight 10/17. 10/25 trach and bronch with return to vent.  11/2 pt passed out while returning from the bathroom pt with respiratory distress from disloged trach leading to PEA arrest with CPR x 10 min. ETT via stoma.  Trach revised 11/2.  PMHx:HTN, DM.   OT comments  Pt progressing well towards OT goals.  Remains limited by decreased activity tolerance and generalized weakness.  Educated on UE exercises using theraband, will need handout next session.  Patient completing toilet transfers with min assist and LB dressing with min guard.  Increased time for all tasks.  Continue to highly recommend CIR at this time, pt will tolerate 3 hours of therapy.    Recommendations for follow up therapy are one component of a multi-disciplinary discharge planning process, led by the attending physician.  Recommendations may be updated based on patient status, additional functional criteria and insurance authorization.    Follow Up Recommendations  Acute inpatient rehab (3hours/day)    Assistance Recommended at Discharge Intermittent Supervision/Assistance  Equipment Recommendations  BSC/3in1    Recommendations for Other Services      Precautions / Restrictions Precautions Precautions: Fall;Other (comment) Precaution Comments: trach Restrictions Weight Bearing Restrictions: No       Mobility Bed Mobility Overal bed mobility: Needs Assistance Bed Mobility: Supine to Sit     Supine to sit: Min guard;HOB elevated     General bed mobility comments: OOB in  recliner    Transfers Overall transfer level: Needs assistance Equipment used: Rolling walker (2 wheels) Transfers: Sit to/from Stand Sit to Stand: Min guard   Step pivot transfers: Min guard       General transfer comment: min guard for safety     Balance Overall balance assessment: Needs assistance Sitting-balance support: No upper extremity supported;Feet supported Sitting balance-[name]: Good Sitting balance - Comments: EOB without physical assist   Standing balance support: No upper extremity supported;During functional activity;Bilateral upper extremity supported Standing balance-[name]: Fair                             ADL either performed or assessed with clinical judgement   ADL Overall ADL's : Needs assistance/impaired     Grooming: Min guard;Standing               Lower Body Dressing: Min guard;Sit to/from stand Lower Body Dressing Details (indicate cue type and reason): mesh underwear, socks Toilet Transfer: Minimal assistance;Ambulation;Rolling walker (2 wheels);Grab bars Toilet Transfer Details (indicate cue type and reason): min assist to power up from commode Toileting- Clothing Manipulation and Hygiene: Minimal assistance;Sit to/from stand Toileting - Clothing Manipulation Details (indicate cue type and reason): for clothing, decreased awareness of soiled underwear     Functional mobility during ADLs: Minimal assistance;Min guard;Rolling walker (2 wheels);Cueing for safety      Extremity/Trunk Assessment              Vision       Perception     Praxis      Cognition Arousal/Alertness: Awake/alert Behavior During Therapy: WFL for tasks assessed/performed Overall Cognitive Status: Within Functional Limits for  tasks assessed                                            Exercises Exercises: General Upper Extremity General Exercises - Upper Extremity Shoulder Flexion: Strengthening;Both;5  reps;Seated;Theraband Theraband Level (Shoulder Flexion): Level 2 (Red) Shoulder Horizontal ABduction: Strengthening;Both;5 reps;Seated;Theraband Theraband Level (Shoulder Horizontal Abduction): Level 2 (Red)   Shoulder Instructions       General Comments on 8L at 35% FiO2    Pertinent Vitals/ Pain       Pain Assessment: No/denies pain Faces Pain Scale: No hurt  Home Living                                          Prior Functioning/Environment              Frequency  Min 2X/week        Progress Toward Goals  OT Goals(current goals can now be found in the care plan section)  Progress towards OT goals: Progressing toward goals  Acute Rehab OT Goals OT Goal Formulation: With patient Time For Goal Achievement: 11/10/21 Potential to Achieve Goals: Good ADL Goals Pt Will Perform Grooming: with modified independence;standing Pt Will Perform Lower Body Dressing: with modified independence;sit to/from stand Pt Will Transfer to Toilet: with modified independence;ambulating Pt Will Perform Tub/Shower Transfer: Shower transfer;with supervision;shower seat;ambulating;rolling walker Additional ADL Goal #1: Pt will demonstrate anticipatory awareness during ADL routine. Additional ADL Goal #2: Pt will complete 3 step trail making tasks with  supervision.  Plan Discharge plan remains appropriate;Frequency remains appropriate    Co-evaluation                 AM-PAC OT "6 Clicks" Daily Activity     Outcome Measure   Help from another person eating meals?: None Help from another person taking care of personal grooming?: A Little Help from another person toileting, which includes using toliet, bedpan, or urinal?: A Little Help from another person bathing (including washing, rinsing, drying)?: A Little Help from another person to put on and taking off regular upper body clothing?: A Little Help from another person to put on and taking off regular lower  body clothing?: A Little 6 Click Score: 19    End of Session Equipment Utilized During Treatment: Oxygen (via trach)  OT Visit Diagnosis: Other abnormalities of gait and mobility (R26.89)   Activity Tolerance Patient tolerated treatment well   Patient Left in chair;with call bell/phone within reach;with nursing/sitter in room   Nurse Communication Mobility status        Time: 0109-3235 OT Time Calculation (min): 35 min  Charges: OT General Charges $OT Visit: 1 Visit OT Treatments $Self Care/Home Management : 8-22 mins $Therapeutic Exercise: 8-22 mins  Jolaine Artist, OT Acute Rehabilitation Services Pager [phone] Office [phone]   Delight Stare 10/27/2021, 1:40 PM

## 2021-10-27 NOTE — Progress Notes (Signed)
PROGRESS NOTE    Maureen Jones  SWF:[id number] DOB: 05/01/1966 DOA: 09/20/2021 PCP: Pcp, No   Brief Narrative: 55 year old with past medical history significant for hypertension and diabetes who presented to [facility] on 10/10 with report of acute altered mental status secondary to hypertensive emergency with hypoxic respiratory failure related to VCD/post intubation, supraglottic and glottic edema/vocal hoarseness plus or minus asthmatic exacerbation.  Ultimately required trach placement.  Transferred back to ICU on 11/2 after trach became dislodged again on the floor resulting in PEA arrest with 10 minutes CPR and 2 mg of epi.  ROSC obtained.  Trach revised to 6.0 XLT. Currently awaiting CIR admission, depending on insurance determination  Significant events: 10/10 Admit with acute encephalopathy, hypertensive 10/12 extubated, increased WOB, re-intubated 10/14 extubated , lingering stridor, Received racemic epi x2 over the last 24 hours + another dose of Decadron, Mild agitation requiring restart of Precedex 10/15 swallow evaluation >> dysphagia 3 diet 10/20 decompensated after switch to PO steroids 10/21 better after CAT and back on IV steroids 10/22 agitated back on  BIPAP 10/25 stridor again. Marked work of breathing. Intubated, airway noises, stridor and wheezing completely resolved post intubation. Spoke to ENT who will follow after trach. Trach placed by Dr Vaughan Browner. Seroquel increased to 100 qhs, changes solumedrol to prednisone to begin taper, rested on propofol over night. Tubefeeds started. RUE PICC placed  10/26 prop weaning off attempting ATC  11/2 PEA arrest in setting of dislodged tracheostomy. questionable syncope.  Total 10 minutes CPR. Transferred to intensive care unit intubated via trach stoma for redo trach 11/2/202.  Redo tracheostomy. 6 XLT. 11/4 Concern overnight for STEMI, hep gtt started, cards consulted; this was artifact 11/5 changed out distal XLT for proximal  due to positioning within trachea 11/8 tolerated trach collar for about 6 hours 11/7, placed back on vent  11/14: Tracheostomy tube was changed from size 6 cuffed to size 5 uncuffed proximal XLT [place]       Assessment & Plan:   Active Problems:   Encephalopathy acute   Endotracheal tube present   Acute respiratory failure (HCC)   Subglottic edema   Vocal cord dysfunction  1-Acute hypoxic respiratory failure in the setting of vocal cord dysfunction/supraglottic and glottic edema/asthma exacerbation: -Patient is status post tracheostomy. -She had dislodgment of her trach on 11/2 leading to PEA arrest.  Required CPR for 10 minutes following which she had ROSC.  -Respiratory status stable,  has not required ventilator for several days.  On trach collar -Trach  was changed to size 5 on cough.  Tolerated PMV  Hypertension: Continue hydralazine and nitrates and carvedilol Medication adjusted in the last 24 hours Plan  to start Norvasc  Diabetes type 2 uncontrolled hyperglycemia: Hemoglobin A1c 12 Continue with Levemir. Monitor blood sugar she has some borderline low glucose   Diabetes gastroparesis: KUB negative for obstruction Lipase and liver function test normal Improved on Reglan PPI  CKD  stage IIIb: Prior creatinine 1.7 last year in Care Everywhere. Renal function stable Cr 2 range.   Health Care associated pneumonia: Completed 7 days of antibiotics  Agitation; Seems to have resolved. Continue to wean down clonazepam and and Seroquel gradually Last modification to Seroquel 11/10  Normocytic  anemia: Likely anemia of critical illness.  Monitor hemoglobin  Hypomagnesemia and hypophosphatemia: Replete magnesium IV.   Dysphagia/nutrition: 2 tracheostomy No longer on tube feedings. On regular diet, upgraded to liquid diet today     Nutrition Problem: Inadequate oral intake Etiology: inability to  eat    Signs/Symptoms: NPO status    Interventions:  Hormel Shake  Estimated body mass index is 35.51 kg/m as calculated from the following:   Height as of this encounter: 5\' 6"  (1.676 m).   Weight as of this encounter: 99.8 kg.   DVT prophylaxis: Heparin Code Status: Partial code Family Communication: Care discussed with patient Disposition Plan:  Status is: Inpatient  Remains inpatient appropriate because: Patient admitted with altered mental status in the setting of hypertensive emergency, subsequently developed respiratory failure and required trach        Consultants:  CCM ENT  Procedures:    Antimicrobials:    Subjective: Patient is alert and conversant, she is sitting in the recliner, trach in place, on 9 L of oxygen, she reported her voice is getting better, denies shortness of breath or chest pain.  Objective: Vitals:   10/27/21 0840 10/27/21 1142 10/27/21 1234 10/27/21 1508  BP:   138/67   Pulse: 96 92 88 90  Resp: 20 18 19 17   Temp:   98.4 F (36.9 C)   TempSrc:   Oral   SpO2:   98%   Weight:      Height:        Intake/Output Summary (Last 24 hours) at 10/27/2021 1535 Last data filed at 10/27/2021 0818 Gross per 24 hour  Intake --  Output 400 ml  Net -400 ml   Filed Weights   10/24/21 0500 10/25/21 0500 10/26/21 0500  Weight: 100 kg 100 kg 99.8 kg    Examination:  General exam: Appears calm and comfortable  Respiratory system: Bilateral rhonchorous, no wheezing Cardiovascular system: S1 & S2 heard, RRR.  Gastrointestinal system: Abdomen is nondistended, soft and nontender. No organomegaly or masses felt. Normal bowel sounds heard. Central nervous system: Alert and oriented. No focal neurological deficits. Extremities: Symmetric 5 x 5 power. Skin: No rashes, lesions or ulcers Psychiatry: She is calm  Data Reviewed: I have personally reviewed following labs and imaging studies  CBC: Recent Labs  Lab 10/21/21 0506 10/23/21 0145 10/25/21 0415 10/27/21 0114  WBC 11.2* 11.7* 11.3*  10.4  HGB 7.7* 8.0* 7.8* 8.1*  HCT 26.9* 26.7* 26.6* 27.0*  MCV 98.5 96.0 97.1 95.7  PLT 363 350 317 109   Basic Metabolic Panel: Recent Labs  Lab 10/20/21 2040 10/21/21 0506 10/23/21 0145 10/25/21 0415 10/27/21 0114  NA  --  142 138 138 137  K  --  4.0 3.9 3.9 4.1  CL  --  107 103 103 101  CO2  --  28 27 27 28   GLUCOSE  --  147* 87 159* 228*  BUN  --  34* 23* 31* 33*  CREATININE  --  2.00* 1.95* 2.05* 2.03*  CALCIUM  --  10.0 9.8 9.0 9.6  MG 2.5* 2.3 1.7 1.8 1.6*  PHOS  --  3.6  --  3.6  --    GFR: Estimated Creatinine Clearance: 37.8 mL/min (A) (by C-G formula based on SCr of 2.03 mg/dL (H)). Liver Function Tests: Recent Labs  Lab 10/21/21 0506 10/23/21 0145 10/25/21 0415  AST 13* 14* 19  ALT 21 16 17   ALKPHOS 129* 109 119  BILITOT 0.3 0.5 0.4  PROT 6.2* 5.9* 5.5*  ALBUMIN 1.9* 2.1* 2.1*   No results for input(s): LIPASE, AMYLASE in the last 168 hours. No results for input(s): AMMONIA in the last 168 hours. Coagulation Profile: No results for input(s): INR, PROTIME in the last 168 hours. Cardiac Enzymes: No  results for input(s): CKTOTAL, CKMB, CKMBINDEX, TROPONINI in the last 168 hours. BNP (last 3 results) No results for input(s): PROBNP in the last 8760 hours. HbA1C: No results for input(s): HGBA1C in the last 72 hours. CBG: Recent Labs  Lab 10/26/21 1201 10/26/21 1551 10/26/21 2106 10/27/21 0816 10/27/21 1232  GLUCAP 268* 174* 181* 247* 264*   Lipid Profile: No results for input(s): CHOL, HDL, LDLCALC, TRIG, CHOLHDL, LDLDIRECT in the last 72 hours. Thyroid Function Tests: No results for input(s): TSH, T4TOTAL, FREET4, T3FREE, THYROIDAB in the last 72 hours. Anemia Panel: No results for input(s): VITAMINB12, FOLATE, FERRITIN, TIBC, IRON, RETICCTPCT in the last 72 hours. Sepsis Labs: No results for input(s): PROCALCITON, LATICACIDVEN in the last 168 hours.  No results found for this or any previous visit (from the past 240 hour(s)).        Radiology Studies: No results found.      Scheduled Meds:  arformoterol  15 mcg Nebulization BID   aspirin  81 mg Oral Daily   budesonide (PULMICORT) nebulizer solution  0.25 mg Nebulization BID   carvedilol  12.5 mg Oral BID   chlorhexidine  15 mL Mouth Rinse BID   Chlorhexidine Gluconate Cloth  6 each Topical Daily   clonazePAM  0.5 mg Oral BID   DULoxetine  60 mg Oral Daily   fluticasone  2 spray Each Nare Daily   heparin injection (subcutaneous)  5,000 Units Subcutaneous Q8H   hydrALAZINE  100 mg Oral Q8H   insulin aspart  0-15 Units Subcutaneous TID WC   insulin aspart  0-5 Units Subcutaneous QHS   insulin detemir  12 Units Subcutaneous BID   isosorbide dinitrate  10 mg Oral BID   lidocaine  2 patch Transdermal Q24H   loratadine  10 mg Oral Daily   mouth rinse  15 mL Mouth Rinse q12n4p   metoCLOPramide  5 mg Oral TID AC   montelukast  10 mg Oral QHS   pantoprazole  40 mg Oral BID   QUEtiapine  50 mg Oral BID   sodium chloride flush  10-40 mL Intracatheter Q12H   Continuous Infusions:  sodium chloride Stopped (10/21/21 0722)     LOS: 37 days    Time spent: 35 minutes    Yarel Kilcrease A Temia Debroux, MD [facility]   If 7PM-7AM, please contact night-coverage [email or web address]  10/27/2021, 3:35 PM

## 2021-10-27 NOTE — Progress Notes (Signed)
Inpatient Diabetes Program Recommendations  AACE/ADA: New Consensus Statement on Inpatient Glycemic Control   Target Ranges:  Prepandial:   less than 140 mg/dL      Peak postprandial:   less than 180 mg/dL (1-2 hours)      Critically ill patients:  140 - 180 mg/dL   Results for Maureen, Jones (MRN [id number]) as of 10/27/2021 14:28  Ref. Range 10/26/2021 07:47 10/26/2021 12:01 10/26/2021 15:51 10/26/2021 21:06 10/27/2021 08:16 10/27/2021 12:32  Glucose-Capillary Latest Ref Range: 70 - 99 mg/dL 153 (H) 268 (H) 174 (H) 181 (H) 247 (H) 264 (H)    Review of Glycemic Control  Current orders for Inpatient glycemic control: Levemir 12 BID, Novolog 0-15 units TID with meals, Novolog 0-5 units QHS  Inpatient Diabetes Program Recommendations:    Insulin: Please consider increasing Levemir to 14 units BID. If post prandial glucose remains consistently over 180 mg/dl, please consider ordering Novolog 3 units TID with meals for meal coverage if patient eats at least 50% of meals.   Thanks, Barnie Alderman, RN, MSN, CDE Diabetes Coordinator Inpatient Diabetes Program [phone] (Team Pager from 8am to 5pm)

## 2021-10-27 NOTE — Progress Notes (Signed)
Physical Therapy Treatment Patient Details Name: Maureen Jones MRN: [id number] DOB: Apr 09, 1966 Today's Date: 10/27/2021   History of Present Illness 55 y.o. female admitted 09/20/21 with AMS. 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 CXR with low lung volumes, L>R interstitial opacities. Acute metabolic encephalopathy in setting of HTN urgency, possible PRES. ETT (10/10-10/12, 10/12-10/14). Course complicated by confusion/agitation overnight 10/17. 10/25 trach and bronch with return to vent.  11/2 pt passed out while returning from the bathroom pt with respiratory distress from disloged trach leading to PEA arrest with CPR x 10 min. ETT via stoma.  Trach revised 11/2.  PMHx:HTN, DM.    PT Comments    Patient very groggy on arrival and states she did not sleep well last night. Progressing with activity and strengthening exercise tolerance. Can tolerate 3 hours of therapy per day. Noted awaiting insurance appeal for ?AIR.     Recommendations for follow up therapy are one component of a multi-disciplinary discharge planning process, led by the attending physician.  Recommendations may be updated based on patient status, additional functional criteria and insurance authorization.  Follow Up Recommendations  Acute inpatient rehab (3hours/day)     Assistance Recommended at Discharge Intermittent Supervision/Assistance  Equipment Recommendations  Rolling walker (2 wheels);BSC/3in1    Recommendations for Other Services       Precautions / Restrictions Precautions Precautions: Fall;Other (comment) Precaution Comments: trach     Mobility  Bed Mobility Overal bed mobility: Needs Assistance Bed Mobility: Supine to Sit     Supine to sit: Min guard;HOB elevated     General bed mobility comments: guarding for safety as pt very groggy    Transfers Overall transfer level: Needs assistance Equipment used: None;Rolling walker (2 wheels) Transfers: Sit to/from Stand Sit to Stand: Min  guard     Step pivot transfers: Min guard     General transfer comment: x 2 reps with good stability; groggy and guarding for safety    Ambulation/Gait Ambulation/Gait assistance: Min assist Gait Distance (Feet): 150 Feet Assistive device: Rolling walker (2 wheels) Gait Pattern/deviations: Step-through pattern;Decreased stride length;Wide base of support Gait velocity: Decreased     General Gait Details: pt with one significant LOB with min assist to recover when turning and leg of RW ended up on the toe of her sock and tripped herself with lateral LOB   Stairs Stairs: Yes Stairs assistance: Min assist Stair Management: One rail Left;Step to pattern;Forwards (plus Rt HHA) Number of Stairs: 10 General stair comments: stepped up forward and then back down backwards x 10 reps with minor imbalances with up to min assist to correct   Wheelchair Mobility    Modified Rankin (Stroke Patients Only) Modified Rankin (Stroke Patients Only) Pre-Morbid Rankin Score: No symptoms Modified Rankin: Moderately severe disability     Balance Overall balance assessment: Needs assistance Sitting-balance support: No upper extremity supported;Feet supported Sitting balance-[name]: Good Sitting balance - Comments: EOB without physical assist   Standing balance support: No upper extremity supported;During functional activity Standing balance-[name]: Fair                              Cognition Arousal/Alertness: Awake/alert Behavior During Therapy: WFL for tasks assessed/performed Overall Cognitive Status: Within Functional Limits for tasks assessed  Exercises      General Comments        Pertinent Vitals/Pain Pain Assessment: No/denies pain Faces Pain Scale: No hurt    Home Living                          Prior Function            PT Goals (current goals can now be found in the care  plan section) Acute Rehab PT Goals Patient Stated Goal: to breathe better PT Goal Formulation: With patient Time For Goal Achievement: 11/08/21 Potential to Achieve Goals: Good Progress towards PT goals: Progressing toward goals    Frequency    Min 3X/week      PT Plan Current plan remains appropriate    Co-evaluation              AM-PAC PT "6 Clicks" Mobility   Outcome Measure  Help needed turning from your back to your side while in a flat bed without using bedrails?: None Help needed moving from lying on your back to sitting on the side of a flat bed without using bedrails?: A Little Help needed moving to and from a bed to a chair (including a wheelchair)?: A Little Help needed standing up from a chair using your arms (e.g., wheelchair or bedside chair)?: A Little Help needed to walk in hospital room?: A Little Help needed climbing 3-5 steps with a railing? : A Lot 6 Click Score: 18    End of Session Equipment Utilized During Treatment: Oxygen Activity Tolerance: Patient tolerated treatment well Patient left: in chair;with call bell/phone within reach;with chair alarm set Nurse Communication: Mobility status PT Visit Diagnosis: Other abnormalities of gait and mobility (R26.89);Difficulty in walking, not elsewhere classified (R26.2)     Time: 9179-1505 PT Time Calculation (min) (ACUTE ONLY): 35 min  Charges:  $Gait Training: 23-37 mins                      [name], PT [place]  Pager [phone] Office [phone]    [name] 10/27/2021, 11:13 AM

## 2021-10-28 DIAGNOSIS — G9340 Encephalopathy, unspecified: Secondary | ICD-10-CM | POA: Diagnosis not present

## 2021-10-28 LAB — BASIC METABOLIC PANEL
Anion gap: 10 (ref 5–15)
BUN: 33 mg/dL — ABNORMAL HIGH (ref 6–20)
CO2: 26 mmol/L (ref 22–32)
Calcium: 9.5 mg/dL (ref 8.9–10.3)
Chloride: 99 mmol/L (ref 98–111)
Creatinine, Ser: 1.92 mg/dL — ABNORMAL HIGH (ref 0.44–1.00)
GFR, Estimated: 31 mL/min — ABNORMAL LOW (ref >60)
Glucose, Bld: 264 mg/dL — ABNORMAL HIGH (ref 70–99)
Potassium: 4.4 mmol/L (ref 3.5–5.1)
Sodium: 135 mmol/L (ref 135–145)

## 2021-10-28 LAB — CBC
HCT: 26.9 % — ABNORMAL LOW (ref 36.0–46.0)
Hemoglobin: 8 g/dL — ABNORMAL LOW (ref 12.0–15.0)
MCH: 28.7 pg (ref 26.0–34.0)
MCHC: 29.7 g/dL — ABNORMAL LOW (ref 30.0–36.0)
MCV: 96.4 fL (ref 80.0–100.0)
Platelets: 301 10*3/uL (ref 150–400)
RBC: 2.79 MIL/uL — ABNORMAL LOW (ref 3.87–5.11)
RDW: 15 % (ref 11.5–15.5)
WBC: 8.9 10*3/uL (ref 4.0–10.5)
nRBC: 0 % (ref 0.0–0.2)

## 2021-10-28 LAB — GLUCOSE, CAPILLARY
Glucose-Capillary: 226 mg/dL — ABNORMAL HIGH (ref 70–99)
Glucose-Capillary: 294 mg/dL — ABNORMAL HIGH (ref 70–99)
Glucose-Capillary: 239 mg/dL — ABNORMAL HIGH (ref 70–99)
Glucose-Capillary: 287 mg/dL — ABNORMAL HIGH (ref 70–99)

## 2021-10-28 LAB — MAGNESIUM: Magnesium: 2 mg/dL (ref 1.7–2.4)

## 2021-10-28 LAB — PHOSPHORUS: Phosphorus: 2.8 mg/dL (ref 2.5–4.6)

## 2021-10-28 MED ORDER — GUAIFENESIN ER 600 MG PO TB12
1200.0000 mg | ORAL_TABLET | Freq: Two times a day (BID) | ORAL | Status: DC
  Administered 2021-10-28 – 2021-11-15 (×37): 1200 mg via ORAL
  Filled 2021-10-28 (×38): qty 2

## 2021-10-28 MED ORDER — INSULIN DETEMIR 100 UNIT/ML ~~LOC~~ SOLN
14.0000 [IU] | Freq: Two times a day (BID) | SUBCUTANEOUS | Status: DC
  Administered 2021-10-28 – 2021-10-29 (×3): 14 [IU] via SUBCUTANEOUS
  Filled 2021-10-28 (×6): qty 0.14

## 2021-10-28 NOTE — Progress Notes (Signed)
PROGRESS NOTE    Maureen Jones  EVO:[id number] DOB: Mar 09, 1966 DOA: 09/20/2021 PCP: Pcp, No   Brief Narrative: 55 year old with past medical history significant for hypertension and diabetes who presented to [facility] on 10/10 with report of acute altered mental status secondary to hypertensive emergency with hypoxic respiratory failure related to VCD/post intubation, supraglottic and glottic edema/vocal hoarseness plus or minus asthmatic exacerbation.  Ultimately required trach placement.  Transferred back to ICU on 11/2 after trach became dislodged again on the floor resulting in PEA arrest with 10 minutes CPR and 2 mg of epi.  ROSC obtained.  Trach revised to 6.0 XLT. Currently awaiting CIR admission, depending on insurance determination  Significant events: 10/10 Admit with acute encephalopathy, hypertensive 10/12 extubated, increased WOB, re-intubated 10/14 extubated , lingering stridor, Received racemic epi x2 over the last 24 hours + another dose of Decadron, Mild agitation requiring restart of Precedex 10/15 swallow evaluation >> dysphagia 3 diet 10/20 decompensated after switch to PO steroids 10/21 better after CAT and back on IV steroids 10/22 agitated back on  BIPAP 10/25 stridor again. Marked work of breathing. Intubated, airway noises, stridor and wheezing completely resolved post intubation. Spoke to ENT who will follow after trach. Trach placed by Dr Vaughan Browner. Seroquel increased to 100 qhs, changes solumedrol to prednisone to begin taper, rested on propofol over night. Tubefeeds started. RUE PICC placed  10/26 prop weaning off attempting ATC  11/2 PEA arrest in setting of dislodged tracheostomy. questionable syncope.  Total 10 minutes CPR. Transferred to intensive care unit intubated via trach stoma for redo trach 11/2/202.  Redo tracheostomy. 6 XLT. 11/4 Concern overnight for STEMI, hep gtt started, cards consulted; this was artifact 11/5 changed out distal XLT for proximal  due to positioning within trachea 11/8 tolerated trach collar for about 6 hours 11/7, placed back on vent  11/14: Tracheostomy tube was changed from size 6 cuffed to size 5 uncuffed proximal XLT [place]       Assessment & Plan:   Active Problems:   Encephalopathy acute   Endotracheal tube present   Acute respiratory failure (HCC)   Subglottic edema   Vocal cord dysfunction  1-Acute hypoxic respiratory failure in the setting of vocal cord dysfunction/supraglottic and glottic edema/asthma exacerbation: -Patient is status post tracheostomy. -She had dislodgment of her trach on 11/2 leading to PEA arrest.  Required CPR for 10 minutes following which she had ROSC.  -Respiratory status stable,  has not required ventilator for several days.  On trach collar -Trach  was changed to size 5 on cough.  Tolerated PMV -Had some cough this am, had to be suction. Will add guaifenesin.   Hypertension: Continue hydralazine and nitrates and carvedilol Medication adjusted in the last 24 hours Continue with Norvasc Might need further adjustment.   Diabetes type 2 uncontrolled hyperglycemia: Hemoglobin A1c 12 Continue with Levemir. Plan to increase levemir 14 units.  Monitor blood sugar she has some borderline low glucose   Diabetes gastroparesis: KUB negative for obstruction Lipase and liver function test normal Improved on Reglan PPI  CKD  stage IIIb: Prior creatinine 1.7 last year in Care Everywhere. Renal function stable Cr 2 range. Stable.   Health Care associated pneumonia: Completed 7 days of antibiotics  Agitation; Seems to have resolved. Continue to wean down clonazepam and and Seroquel gradually Last modification to Seroquel 11/10  Normocytic  anemia: Likely anemia of critical illness.  Monitor hemoglobin  Hypomagnesemia and hypophosphatemia: Replaced.   Dysphagia/nutrition: 2 tracheostomy No longer on  tube feedings. On regular diet, upgraded to liquid diet  today     Nutrition Problem: Inadequate oral intake Etiology: inability to eat    Signs/Symptoms: NPO status    Interventions: Hormel Shake  Estimated body mass index is 35.51 kg/m as calculated from the following:   Height as of this encounter: 5\' 6"  (1.676 m).   Weight as of this encounter: 99.8 kg.   DVT prophylaxis: Heparin Code Status: Partial code Family Communication: Care discussed with patient Disposition Plan:  Status is: Inpatient  Remains inpatient appropriate because: Patient admitted with altered mental status in the setting of hypertensive emergency, subsequently developed respiratory failure and required trach        Consultants:  CCM ENT  Procedures:    Antimicrobials:    Subjective: She had coughing spell this am. She had to be suction. I check on her later and she was doing well.   Objective: Vitals:   10/28/21 0636 10/28/21 0738 10/28/21 0755 10/28/21 1210  BP: (!) 179/91  (!) 181/86 131/68  Pulse: 95  92 93  Resp: 18  15 20   Temp:   98.4 F (36.9 C) 97.7 F (36.5 C)  TempSrc:   Oral Oral  SpO2: 94% 93% 92% 90%  Weight:      Height:        Intake/Output Summary (Last 24 hours) at 10/28/2021 1557 Last data filed at 10/28/2021 1509 Gross per 24 hour  Intake 120 ml  Output 1300 ml  Net -1180 ml    Filed Weights   10/24/21 0500 10/25/21 0500 10/26/21 0500  Weight: 100 kg 100 kg 99.8 kg    Examination:  General exam: NAD Respiratory system: BL ronchus Cardiovascular system: S 1, S 2 RRR  Gastrointestinal system: BS present, soft, nt Central nervous system: alert, oriented, calm Extremities: No edema Skin: No rashes  Data Reviewed: I have personally reviewed following labs and imaging studies  CBC: Recent Labs  Lab 10/23/21 0145 10/25/21 0415 10/27/21 0114 10/28/21 0059  WBC 11.7* 11.3* 10.4 8.9  HGB 8.0* 7.8* 8.1* 8.0*  HCT 26.7* 26.6* 27.0* 26.9*  MCV 96.0 97.1 95.7 96.4  PLT 350 317 321 301     Basic Metabolic Panel: Recent Labs  Lab 10/23/21 0145 10/25/21 0415 10/27/21 0114 10/28/21 0059  NA 138 138 137 135  K 3.9 3.9 4.1 4.4  CL 103 103 101 99  CO2 27 27 28 26   GLUCOSE 87 159* 228* 264*  BUN 23* 31* 33* 33*  CREATININE 1.95* 2.05* 2.03* 1.92*  CALCIUM 9.8 9.0 9.6 9.5  MG 1.7 1.8 1.6* 2.0  PHOS  --  3.6  --  2.8    GFR: Estimated Creatinine Clearance: 39.9 mL/min (A) (by C-G formula based on SCr of 1.92 mg/dL (H)). Liver Function Tests: Recent Labs  Lab 10/23/21 0145 10/25/21 0415  AST 14* 19  ALT 16 17  ALKPHOS 109 119  BILITOT 0.5 0.4  PROT 5.9* 5.5*  ALBUMIN 2.1* 2.1*    No results for input(s): LIPASE, AMYLASE in the last 168 hours. No results for input(s): AMMONIA in the last 168 hours. Coagulation Profile: No results for input(s): INR, PROTIME in the last 168 hours. Cardiac Enzymes: No results for input(s): CKTOTAL, CKMB, CKMBINDEX, TROPONINI in the last 168 hours. BNP (last 3 results) No results for input(s): PROBNP in the last 8760 hours. HbA1C: No results for input(s): HGBA1C in the last 72 hours. CBG: Recent Labs  Lab 10/27/21 1232 10/27/21 1608 10/27/21 2045  10/28/21 0755 10/28/21 1209  GLUCAP 264* 294* 235* 226* 294*    Lipid Profile: No results for input(s): CHOL, HDL, LDLCALC, TRIG, CHOLHDL, LDLDIRECT in the last 72 hours. Thyroid Function Tests: No results for input(s): TSH, T4TOTAL, FREET4, T3FREE, THYROIDAB in the last 72 hours. Anemia Panel: No results for input(s): VITAMINB12, FOLATE, FERRITIN, TIBC, IRON, RETICCTPCT in the last 72 hours. Sepsis Labs: No results for input(s): PROCALCITON, LATICACIDVEN in the last 168 hours.  No results found for this or any previous visit (from the past 240 hour(s)).       Radiology Studies: No results found.      Scheduled Meds:  amLODipine  5 mg Oral Daily   arformoterol  15 mcg Nebulization BID   aspirin  81 mg Oral Daily   budesonide (PULMICORT) nebulizer solution   0.25 mg Nebulization BID   carvedilol  12.5 mg Oral BID   chlorhexidine  15 mL Mouth Rinse BID   Chlorhexidine Gluconate Cloth  6 each Topical Daily   clonazePAM  0.5 mg Oral BID   DULoxetine  60 mg Oral Daily   fluticasone  2 spray Each Nare Daily   guaiFENesin  1,200 mg Oral BID   heparin injection (subcutaneous)  5,000 Units Subcutaneous Q8H   hydrALAZINE  100 mg Oral Q8H   insulin aspart  0-15 Units Subcutaneous TID WC   insulin aspart  0-5 Units Subcutaneous QHS   insulin detemir  12 Units Subcutaneous BID   isosorbide dinitrate  10 mg Oral BID   lidocaine  2 patch Transdermal Q24H   loratadine  10 mg Oral Daily   mouth rinse  15 mL Mouth Rinse q12n4p   metoCLOPramide  5 mg Oral TID AC   montelukast  10 mg Oral QHS   pantoprazole  40 mg Oral BID   QUEtiapine  50 mg Oral BID   sodium chloride flush  10-40 mL Intracatheter Q12H   Continuous Infusions:  sodium chloride Stopped (10/21/21 0722)     LOS: 38 days    Time spent: 35 minutes    Dina Mobley A Yessika Otte, MD [facility]   If 7PM-7AM, please contact night-coverage [email or web address]  10/28/2021, 3:57 PM

## 2021-10-28 NOTE — Plan of Care (Signed)
  Problem: Health Behavior/Discharge Planning: Goal: Ability to manage health-related needs will improve 10/28/2021 1039 by Kennis Carina, RN Outcome: Progressing 10/28/2021 1038 by Kennis Carina, RN Outcome: Progressing   Problem: Clinical Measurements: Goal: Ability to maintain clinical measurements within normal limits will improve 10/28/2021 1039 by Kennis Carina, RN Outcome: Progressing 10/28/2021 1038 by Kennis Carina, RN Outcome: Progressing Goal: Will remain free from infection 10/28/2021 1039 by Kennis Carina, RN Outcome: Progressing 10/28/2021 1038 by Kennis Carina, RN Outcome: Progressing Goal: Diagnostic test results will improve 10/28/2021 1039 by Kennis Carina, RN Outcome: Progressing 10/28/2021 1038 by Kennis Carina, RN Outcome: Progressing Goal: Respiratory complications will improve 10/28/2021 1039 by Kennis Carina, RN Outcome: Progressing 10/28/2021 1038 by Kennis Carina, RN Outcome: Progressing Goal: Cardiovascular complication will be avoided 10/28/2021 1039 by Kennis Carina, RN Outcome: Progressing 10/28/2021 1038 by Kennis Carina, RN Outcome: Progressing   Problem: Activity: Goal: Risk for activity intolerance will decrease 10/28/2021 1039 by Kennis Carina, RN Outcome: Progressing 10/28/2021 1038 by Kennis Carina, RN Outcome: Progressing   Problem: Nutrition: Goal: Adequate nutrition will be maintained 10/28/2021 1039 by Kennis Carina, RN Outcome: Progressing 10/28/2021 1038 by Kennis Carina, RN Outcome: Progressing   Problem: Elimination: Goal: Will not experience complications related to bowel motility 10/28/2021 1039 by Kennis Carina, RN Outcome: Progressing 10/28/2021 1038 by Kennis Carina, RN Outcome: Progressing Goal: Will not experience complications related to urinary retention 10/28/2021 1039 by Kennis Carina, RN Outcome:  Progressing 10/28/2021 1038 by Kennis Carina, RN Outcome: Progressing   Problem: Pain Managment: Goal: General experience of comfort will improve 10/28/2021 1039 by Kennis Carina, RN Outcome: Progressing 10/28/2021 1038 by Kennis Carina, RN Outcome: Progressing   Problem: Coping: Goal: Will verbalize positive feelings about self 10/28/2021 1039 by Kennis Carina, RN Outcome: Progressing 10/28/2021 1038 by Kennis Carina, RN Outcome: Progressing Goal: Will identify appropriate support needs 10/28/2021 1039 by Kennis Carina, RN Outcome: Progressing 10/28/2021 1038 by Kennis Carina, RN Outcome: Progressing   Problem: Nutrition: Goal: Risk of aspiration will decrease 10/28/2021 1039 by Kennis Carina, RN Outcome: Progressing 10/28/2021 1038 by Kennis Carina, RN Outcome: Progressing Goal: Dietary intake will improve 10/28/2021 1039 by Kennis Carina, RN Outcome: Progressing 10/28/2021 1038 by Kennis Carina, RN Outcome: Progressing   Problem: Self-Care: Goal: Ability to participate in self-care as condition permits will improve 10/28/2021 1039 by Kennis Carina, RN Outcome: Progressing 10/28/2021 1038 by Kennis Carina, RN Outcome: Progressing Goal: Verbalization of feelings and concerns over difficulty with self-care will improve 10/28/2021 1039 by Kennis Carina, RN Outcome: Progressing 10/28/2021 1038 by Kennis Carina, RN Outcome: Progressing

## 2021-10-28 NOTE — Progress Notes (Signed)
Noted that blood sugars have been greater than 180 mg/dl.   Recommend increasing Levemir to 14 units BID and consider adding Novolog 3 units TID with meals if patient eats at least 50% of meals.   Harvel Ricks RN BSN CDE Diabetes Coordinator Pager: [phone]  8am-5pm

## 2021-10-29 DIAGNOSIS — G9340 Encephalopathy, unspecified: Secondary | ICD-10-CM | POA: Diagnosis not present

## 2021-10-29 LAB — GLUCOSE, CAPILLARY
Glucose-Capillary: 209 mg/dL — ABNORMAL HIGH (ref 70–99)
Glucose-Capillary: 282 mg/dL — ABNORMAL HIGH (ref 70–99)
Glucose-Capillary: 244 mg/dL — ABNORMAL HIGH (ref 70–99)
Glucose-Capillary: 255 mg/dL — ABNORMAL HIGH (ref 70–99)

## 2021-10-29 NOTE — Progress Notes (Signed)
Inpatient Rehab Admissions Coordinator:    Insurance denied appeal for CIR. We have no further options for appeal. She will need to either discharge home or to another rehab venue. CIR will sign off. TOC notified.   Clemens Catholic, [place], [place] Admissions Coordinator  [phone] ([place]) [phone] (office)

## 2021-10-29 NOTE — Progress Notes (Signed)
Physical Therapy Treatment Patient Details Name: Maureen Jones MRN: [id number] DOB: [birth year] Today's Date: 10/29/2021   History of Present Illness 55 y.o. female admitted 09/20/21 with AMS. 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 CXR with low lung volumes, L>R interstitial opacities. Acute metabolic encephalopathy in setting of HTN urgency, possible PRES. ETT (10/10-10/12, 10/12-10/14). Course complicated by confusion/agitation overnight 10/17. 10/25 trach and bronch with return to vent.  11/2 pt passed out while returning from the bathroom pt with respiratory distress from disloged trach leading to PEA arrest with CPR x 10 min. ETT via stoma.  Trach revised 11/2.  PMHx:HTN, DM.    PT Comments    Pt was seen for addition of exercises in the room for her LE's, which led to some coughing and clearing her breathing passages.  PMV in place, which inhibited her ability to fully clear out mucus.  Nursing called in to suction, and finally called her RT to do deep suction.  Pt is at 95-100% sat but anxious about her feeling of being congested.  Finally ended session due to her discomfort, and will progress more with gait next time when pt is more comfortable with breathing.  Follow for acute PT goals.   Recommendations for follow up therapy are one component of a multi-disciplinary discharge planning process, led by the attending physician.  Recommendations may be updated based on patient status, additional functional criteria and insurance authorization.  Follow Up Recommendations  Acute inpatient rehab (3hours/day)     Assistance Recommended at Discharge Intermittent Supervision/Assistance  Equipment Recommendations  Rolling walker (2 wheels)    Recommendations for Other Services Rehab consult     Precautions / Restrictions Precautions Precautions: Fall;Other (comment) Precaution Comments: trach Restrictions Weight Bearing Restrictions: No     Mobility  Bed Mobility                General bed mobility comments: up in chair in her room    Transfers Overall transfer level: Needs assistance   Transfers: Sit to/from Stand Sit to Stand: Min guard                Ambulation/Gait                   Stairs             Wheelchair Mobility    Modified Rankin (Stroke Patients Only)       Balance Overall balance assessment: Needs assistance Sitting-balance support: Feet supported Sitting balance-[name]: Good                                      Cognition Arousal/Alertness: Awake/alert Behavior During Therapy: WFL for tasks assessed/performed Overall Cognitive Status: Impaired/Different from baseline Area of Impairment: Memory;Awareness                   Current Attention Level: Selective Memory: Decreased short-term memory Following Commands: Follows one step commands with increased time Safety/Judgement: Decreased awareness of deficits Awareness: Intellectual Problem Solving: Requires verbal cues          Exercises General Exercises - Lower Extremity Ankle Circles/Pumps: AROM;Both;10 reps;Seated Long Arc Quad: Strengthening;10 reps (theraband) Heel Slides: Strengthening;10 reps (theraband) Hip ABduction/ADduction: Strengthening;10 reps (Theraband) Hip Flexion/Marching: 10 reps    General Comments General comments (skin integrity, edema, etc.): 5L 28% FiO2 via trach collar, congested and contacted nursing to suction, but pt eventually was waiting  for RT      Pertinent Vitals/Pain Pain Assessment: No/denies pain    Home Living                          Prior Function            PT Goals (current goals can now be found in the care plan section) Acute Rehab PT Goals Patient Stated Goal: to breathe better    Frequency    Min 3X/week      PT Plan Current plan remains appropriate    Co-evaluation              AM-PAC PT "6 Clicks" Mobility   Outcome Measure   Help needed turning from your back to your side while in a flat bed without using bedrails?: None Help needed moving from lying on your back to sitting on the side of a flat bed without using bedrails?: A Little Help needed moving to and from a bed to a chair (including a wheelchair)?: A Little Help needed standing up from a chair using your arms (e.g., wheelchair or bedside chair)?: A Little Help needed to walk in hospital room?: A Little Help needed climbing 3-5 steps with a railing? : A Lot 6 Click Score: 18    End of Session Equipment Utilized During Treatment: Oxygen Activity Tolerance: Patient tolerated treatment well;Treatment limited secondary to medical complications (Comment) (congestion that was difficult to clear from cough) Patient left: in chair;with call bell/phone within reach;with chair alarm set Nurse Communication: Mobility status PT Visit Diagnosis: Other abnormalities of gait and mobility (R26.89);Difficulty in walking, not elsewhere classified (R26.2);Muscle weakness (generalized) (M62.81)     Time: 4010-2725 PT Time Calculation (min) (ACUTE ONLY): 27 min  Charges:  $Therapeutic Exercise: 8-22 mins $Therapeutic Activity: 8-22 mins                   Ramond Dial 10/29/2021, 5:40 PM  Mee Hives, PT PhD Acute Rehab Dept. Number: [place] [facility] [place]

## 2021-10-29 NOTE — Progress Notes (Signed)
PROGRESS NOTE    Maureen Jones  WUJ:[id number] DOB: [birth year] DOA: 09/20/2021 PCP: Pcp, No   Brief Narrative: 55 year old with past medical history significant for hypertension and diabetes who presented to [facility] on 10/10 with report of acute altered mental status secondary to hypertensive emergency with hypoxic respiratory failure related to VCD/post intubation, supraglottic and glottic edema/vocal hoarseness plus or minus asthmatic exacerbation.  Ultimately required trach placement.  Transferred back to ICU on 11/2 after trach became dislodged again on the floor resulting in PEA arrest with 10 minutes CPR and 2 mg of epi.  ROSC obtained.  Trach revised to 6.0 XLT. Insurance denied CIR. Patient will need SNF or LTAC>   Significant events: 10/10 Admit with acute encephalopathy, hypertensive 10/12 extubated, increased WOB, re-intubated 10/14 extubated , lingering stridor, Received racemic epi x2 over the last 24 hours + another dose of Decadron, Mild agitation requiring restart of Precedex 10/15 swallow evaluation >> dysphagia 3 diet 10/20 decompensated after switch to PO steroids 10/21 better after CAT and back on IV steroids 10/22 agitated back on  BIPAP 10/25 stridor again. Marked work of breathing. Intubated, airway noises, stridor and wheezing completely resolved post intubation. Spoke to ENT who will follow after trach. Trach placed by Dr Vaughan Browner. Seroquel increased to 100 qhs, changes solumedrol to prednisone to begin taper, rested on propofol over night. Tubefeeds started. RUE PICC placed  10/26 prop weaning off attempting ATC  11/2 PEA arrest in setting of dislodged tracheostomy. questionable syncope.  Total 10 minutes CPR. Transferred to intensive care unit intubated via trach stoma for redo trach 11/2/202.  Redo tracheostomy. 6 XLT. 11/4 Concern overnight for STEMI, hep gtt started, cards consulted; this was artifact 11/5 changed out distal XLT for proximal due to positioning  within trachea 11/8 tolerated trach collar for about 6 hours 11/7, placed back on vent  11/14: Tracheostomy tube was changed from size 6 cuffed to size 5 uncuffed proximal XLT [place]       Assessment & Plan:   Active Problems:   Encephalopathy acute   Endotracheal tube present   Acute respiratory failure (HCC)   Subglottic edema   Vocal cord dysfunction  1-Acute hypoxic respiratory failure in the setting of vocal cord dysfunction/supraglottic and glottic edema/asthma exacerbation: -Patient is status post tracheostomy. -She had dislodgment of her trach on 11/2 leading to PEA arrest.  Required CPR for 10 minutes following which she had ROSC.  -Respiratory status stable,  has not required ventilator for several days.  On trach collar -Trach  was changed to size 5 on cough.  Tolerated PMV -Continue with Guaifenesin.  -She is breathing ok. Down on 5 L oxygen.   Hypertension: Continue hydralazine and nitrates and carvedilol Continue with Norvasc   Diabetes type 2 uncontrolled hyperglycemia: Hemoglobin A1c 12 Continue with Levemir. Plan to increase levemir 14 units.  Monitor blood sugar she has some borderline low glucose   Diabetes gastroparesis: KUB negative for obstruction Lipase and liver function test normal Improved on Reglan PPI  CKD  stage IIIb: Prior creatinine 1.7 last year in Care Everywhere. Renal function stable Cr 1.9---2  Health Care associated pneumonia: Completed 7 days of antibiotics  Agitation; Seems to have resolved. Continue to wean down clonazepam and and Seroquel gradually Last modification to Seroquel 11/10  Normocytic  anemia: Likely anemia of critical illness.  Monitor hemoglobin  Hypomagnesemia and hypophosphatemia: Replaced.   Dysphagia/nutrition: 2 tracheostomy No longer on tube feedings. On regular diet, upgraded to liquid diet today  Nutrition Problem: Inadequate oral intake Etiology: inability to eat    Signs/Symptoms:  NPO status    Interventions: Hormel Shake  Estimated body mass index is 35.69 kg/m as calculated from the following:   Height as of this encounter: 5\' 6"  (1.676 m).   Weight as of this encounter: 100.3 kg.   DVT prophylaxis: Heparin Code Status: Partial code Family Communication: Care discussed with patient Disposition Plan:  Status is: Inpatient  Remains inpatient appropriate because: Patient admitted with altered mental status in the setting of hypertensive emergency, subsequently developed respiratory failure and required trach        Consultants:  CCM ENT  Procedures:    Antimicrobials:    Subjective: Cough better. Denies worsening dyspnea  Objective: Vitals:   10/29/21 0747 10/29/21 0824 10/29/21 1206 10/29/21 1354  BP:  (!) 174/93 137/74   Pulse:  99 97   Resp:  19 16   Temp:  98 F (36.7 C) 98.2 F (36.8 C)   TempSrc:  Oral Oral   SpO2: 94% 92% 99% 93%  Weight:      Height:        Intake/Output Summary (Last 24 hours) at 10/29/2021 1551 Last data filed at 10/29/2021 0900 Gross per 24 hour  Intake 240 ml  Output 600 ml  Net -360 ml    Filed Weights   10/25/21 0500 10/26/21 0500 10/29/21 0500  Weight: 100 kg 99.8 kg 100.3 kg    Examination:  General exam: NAD Respiratory system: BL [place] Cardiovascular system: S 1, S 2 RRR Gastrointestinal system: BS present, soft, nt Central nervous system: Alert, follows command Extremities: No edema Skin: No rashes  Data Reviewed: I have personally reviewed following labs and imaging studies  CBC: Recent Labs  Lab 10/23/21 0145 10/25/21 0415 10/27/21 0114 10/28/21 0059  WBC 11.7* 11.3* 10.4 8.9  HGB 8.0* 7.8* 8.1* 8.0*  HCT 26.7* 26.6* 27.0* 26.9*  MCV 96.0 97.1 95.7 96.4  PLT 350 317 321 502    Basic Metabolic Panel: Recent Labs  Lab 10/23/21 0145 10/25/21 0415 10/27/21 0114 10/28/21 0059  NA 138 138 137 135  K 3.9 3.9 4.1 4.4  CL 103 103 101 99  CO2 27 27 28 26    GLUCOSE 87 159* 228* 264*  BUN 23* 31* 33* 33*  CREATININE 1.95* 2.05* 2.03* 1.92*  CALCIUM 9.8 9.0 9.6 9.5  MG 1.7 1.8 1.6* 2.0  PHOS  --  3.6  --  2.8    GFR: Estimated Creatinine Clearance: 40 mL/min (A) (by C-G formula based on SCr of 1.92 mg/dL (H)). Liver Function Tests: Recent Labs  Lab 10/23/21 0145 10/25/21 0415  AST 14* 19  ALT 16 17  ALKPHOS 109 119  BILITOT 0.5 0.4  PROT 5.9* 5.5*  ALBUMIN 2.1* 2.1*    No results for input(s): LIPASE, AMYLASE in the last 168 hours. No results for input(s): AMMONIA in the last 168 hours. Coagulation Profile: No results for input(s): INR, PROTIME in the last 168 hours. Cardiac Enzymes: No results for input(s): CKTOTAL, CKMB, CKMBINDEX, TROPONINI in the last 168 hours. BNP (last 3 results) No results for input(s): PROBNP in the last 8760 hours. HbA1C: No results for input(s): HGBA1C in the last 72 hours. CBG: Recent Labs  Lab 10/28/21 1209 10/28/21 1626 10/28/21 2200 10/29/21 0827 10/29/21 1207  GLUCAP 294* 239* 287* 209* 282*    Lipid Profile: No results for input(s): CHOL, HDL, LDLCALC, TRIG, CHOLHDL, LDLDIRECT in the last 72 hours. Thyroid Function  Tests: No results for input(s): TSH, T4TOTAL, FREET4, T3FREE, THYROIDAB in the last 72 hours. Anemia Panel: No results for input(s): VITAMINB12, FOLATE, FERRITIN, TIBC, IRON, RETICCTPCT in the last 72 hours. Sepsis Labs: No results for input(s): PROCALCITON, LATICACIDVEN in the last 168 hours.  No results found for this or any previous visit (from the past 240 hour(s)).       Radiology Studies: No results found.      Scheduled Meds:  amLODipine  5 mg Oral Daily   arformoterol  15 mcg Nebulization BID   aspirin  81 mg Oral Daily   budesonide (PULMICORT) nebulizer solution  0.25 mg Nebulization BID   carvedilol  12.5 mg Oral BID   chlorhexidine  15 mL Mouth Rinse BID   Chlorhexidine Gluconate Cloth  6 each Topical Daily   clonazePAM  0.5 mg Oral BID    DULoxetine  60 mg Oral Daily   fluticasone  2 spray Each Nare Daily   guaiFENesin  1,200 mg Oral BID   heparin injection (subcutaneous)  5,000 Units Subcutaneous Q8H   hydrALAZINE  100 mg Oral Q8H   insulin aspart  0-15 Units Subcutaneous TID WC   insulin aspart  0-5 Units Subcutaneous QHS   insulin detemir  14 Units Subcutaneous BID   isosorbide dinitrate  10 mg Oral BID   lidocaine  2 patch Transdermal Q24H   loratadine  10 mg Oral Daily   mouth rinse  15 mL Mouth Rinse q12n4p   metoCLOPramide  5 mg Oral TID AC   montelukast  10 mg Oral QHS   pantoprazole  40 mg Oral BID   QUEtiapine  50 mg Oral BID   sodium chloride flush  10-40 mL Intracatheter Q12H   Continuous Infusions:  sodium chloride Stopped (10/21/21 0722)     LOS: 39 days    Time spent: 35 minutes    Aeris Hersman A Eyal Greenhaw, MD [facility]   If 7PM-7AM, please contact night-coverage [email or web address]  10/29/2021, 3:51 PM

## 2021-10-29 NOTE — Progress Notes (Signed)
Occupational Therapy Treatment Patient Details Name: Maureen Jones MRN: [id number] DOB: May 29, 1966 Today's Date: 10/29/2021   History of present illness 56 y.o. female admitted 09/20/21 with AMS. 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 CXR with low lung volumes, L>R interstitial opacities. Acute metabolic encephalopathy in setting of HTN urgency, possible PRES. ETT (10/10-10/12, 10/12-10/14). Course complicated by confusion/agitation overnight 10/17. 10/25 trach and bronch with return to vent.  11/2 pt passed out while returning from the bathroom pt with respiratory distress from disloged trach leading to PEA arrest with CPR x 10 min. ETT via stoma.  Trach revised 11/2.  PMHx:HTN, DM.   OT comments  Patient supine in bed upon entry.  Awakes to therapist entry, assisted to EOB and noted bed soiled in urine.  Pt unaware of bed soiled (purewick not working).  Pt requires min guard for bathing/LB dressing at EOB, requires increased time and rest breaks due to fatigue.  VSS on Trach Collar but requires cueing for pacing.  Patient transfers to recliner with min guard.  Seated in recliner reviewed HEP using theraband.  Pt reports having 24/7 support at home, since insurance declined CIR updated recommendations to [facility] at this time.  Will follow acutely.    Recommendations for follow up therapy are one component of a multi-disciplinary discharge planning process, led by the attending physician.  Recommendations may be updated based on patient status, additional functional criteria and insurance authorization.    Follow Up Recommendations  Home health OT (insurance denied CIR)    Assistance Recommended at Discharge Frequent or constant Supervision/Assistance  Equipment Recommendations  BSC/3in1    Recommendations for Other Services      Precautions / Restrictions Precautions Precautions: Fall;Other (comment) Precaution Comments: trach Restrictions Weight Bearing Restrictions: No        Mobility Bed Mobility Overal bed mobility: Needs Assistance Bed Mobility: Supine to Sit     Supine to sit: Supervision     General bed mobility comments: for safety, line mgmt    Transfers Overall transfer level: Needs assistance Equipment used: Rolling walker (2 wheels) Transfers: Sit to/from Stand Sit to Stand: Min guard           General transfer comment: for safety to recliner     Balance Overall balance assessment: Needs assistance Sitting-balance support: No upper extremity supported;Feet supported Sitting balance-[name]: Good     Standing balance support: No upper extremity supported;During functional activity Standing balance-[name]: Fair Standing balance comment: dynamcially min guard                           ADL either performed or assessed with clinical judgement   ADL Overall ADL's : Needs assistance/impaired             Lower Body Bathing: Min guard;Sit to/from stand   Upper Body Dressing : Minimal assistance;Sitting   Lower Body Dressing: Min guard;Sit to/from stand Lower Body Dressing Details (indicate cue type and reason): mesh underwear, socks Toilet Transfer: Min guard;Ambulation;Rolling walker (2 wheels) Toilet Transfer Details (indicate cue type and reason): simulated to recliner         Functional mobility during ADLs: Min guard;Rolling walker (2 wheels) General ADL Comments: limited by decreased activity tolerance, she requires multiple rest breaks during bathing at [facility]    Extremity/Trunk Assessment              Vision       Perception     Praxis  Cognition Arousal/Alertness: Awake/alert Behavior During Therapy: WFL for tasks assessed/performed Overall Cognitive Status: Impaired/Different from baseline                             Awareness: Emergent   General Comments: pt appears WFL, question awareness at pt soiled in urine in bed.          Exercises Exercises:  General Upper Extremity General Exercises - Upper Extremity Shoulder Flexion: Strengthening;Both;10 reps;Seated Theraband Level (Shoulder Flexion): Level 2 (Red) Shoulder Horizontal ABduction: Strengthening;Both;Seated;Theraband;10 reps Theraband Level (Shoulder Horizontal Abduction): Level 2 (Red) Elbow Flexion: Strengthening;Both;10 reps;Seated;Theraband Theraband Level (Elbow Flexion): Level 2 (Red) Elbow Extension: Strengthening;10 reps;Both;Seated;Theraband Theraband Level (Elbow Extension): Level 2 (Red)   Shoulder Instructions       General Comments 5L 28% FiO2 via trach collar, cueing for pacing and slowed breathing when fatigued. VSS    Pertinent Vitals/ Pain       Pain Assessment: No/denies pain  Home Living                                          Prior Functioning/Environment              Frequency  Min 2X/week        Progress Toward Goals  OT Goals(current goals can now be found in the care plan section)  Progress towards OT goals: Progressing toward goals  Acute Rehab OT Goals OT Goal Formulation: With patient Time For Goal Achievement: 11/10/21 Potential to Achieve Goals: Good  Plan Discharge plan remains appropriate;Frequency remains appropriate    Co-evaluation                 AM-PAC OT "6 Clicks" Daily Activity     Outcome Measure   Help from another person eating meals?: None Help from another person taking care of personal grooming?: A Little Help from another person toileting, which includes using toliet, bedpan, or urinal?: A Little Help from another person bathing (including washing, rinsing, drying)?: A Little Help from another person to put on and taking off regular upper body clothing?: A Little Help from another person to put on and taking off regular lower body clothing?: A Little 6 Click Score: 19    End of Session Equipment Utilized During Treatment: Oxygen (via trach)  OT Visit Diagnosis: Other  abnormalities of gait and mobility (R26.89)   Activity Tolerance Patient tolerated treatment well   Patient Left in chair;with call bell/phone within reach;with chair alarm set;with nursing/sitter in room   Nurse Communication Mobility status        Time: 7017-7939 OT Time Calculation (min): 35 min  Charges: OT General Charges $OT Visit: 1 Visit OT Treatments $Self Care/Home Management : 8-22 mins $Therapeutic Exercise: 8-22 mins  Jolaine Artist, OT Acute Rehabilitation Services Pager [phone] Office [phone]   Delight Stare 10/29/2021, 12:47 PM

## 2021-10-29 NOTE — Progress Notes (Addendum)
Speech Language Pathology Treatment: Dysphagia;Passy Muir Speaking valve  Patient Details Name: Maureen Jones MRN: [id number] DOB: Feb 28, 1966 Today's Date: 10/29/2021 Time: 1035-1100 SLP Time Calculation (min) (ACUTE ONLY): 25 min  Assessment / Plan / Recommendation Clinical Impression  Pt was seen for treatment. She participated well and denied any difficulty with the advanced diet. Cups with straws were noted at bedside and pt did admit to using one of them. Pt stated that she no longer feels as though she has reduced control with straws. She tolerated thin liquids via straw without overt s/sx of aspiration when she consistently used individual sips. Pt was educated that avoidance of straws is still safer and she stated, "I'll just leave the straws alone then." She tolerated PMSV for the duration of the session with stable vitals. Respiratory support continues to be reduced; this results in reduced vocal intensity, and vocal strain towards the ends of utterances. Pt was educated regarding diaphragmatic breathing and coordination of respiration with speech. She verbalized understanding, but exhibited a reverse breathing pattern and frequently clavicular breathing was demonstrated despite verbal prompts and tactile cues. Pt demonstrated 60% accuracy with coordination of respiration and speech at the phrase level increasing to 80% with cues. Pt's current diet of regular texture solids and thin liquids will be continued and pt may still have PMSV during all waking hours. SLP will continue to follow pt.     HPI HPI: Pt is a 56 y/o female admitted with AMS 10/10. Stroke w/u negative, dx with acute metabolic encephalopathy in the setting of hypertensive emergency. ETT 10/10-10/12; reintubated 10/12 d/t increased WOB, not mobilizing secretions, stridor, received steroids, extubated 10/14 with lingering stridor, which was improving 10/15. ENT scoped pt 10/18: "Edema of the glottis and subglottic larynx  following recent intubation and then repeat intubation.  There is no evidence of cord paralysis or any tumor present.  There is currently no stridor.  Allow more time for resolution of the swelling.  Unfortunately she is at risk for developing subglottic stenosis." SLP followed pt 10/15-10/24. MBS 10/19: trace posterior spillage, mild pharyngeal delay, trace transient penetration; regular texture diet with thin liquids recommended and SLP ultimately signed off with pt demonstrating adequate tolerance. 10/25: Stridor again noted; pt intubated and trach placed.  MBS 10/27: oropharyngeal dysphagia characterized by reduced bolus cohesion, reduced hyolaryngeal elevation, reduced anterior laryngeal movement, a pharyngeal delay, penetration (PAS 3, 5) with thin liquids. A dyspahgia 3 diet with nectar thick liquids was initiated and she was subsequently advanced to regular and thin liquids on 10/30. Pt with respiratory distress from disloged trach on 11/2 leading to PEA arrest with CPR x 10 min. ETT via stoma. Trach revised 11/2; #6XLT distal.  Cortrak placed 11/4. Trach changed from 6XL distal to 6XL proximal 11/5.      SLP Plan  Continue with current plan of care      Recommendations for follow up therapy are one component of a multi-disciplinary discharge planning process, led by the attending physician.  Recommendations may be updated based on patient status, additional functional criteria and insurance authorization.    Recommendations  Diet recommendations: Regular;Thin liquid Liquids provided via: Cup;No straw Medication Administration: Whole meds with puree Supervision: Patient able to self feed Compensations: Slow rate;Small sips/bites      Patient may use Passy-Muir Speech Valve: During all waking hours (remove during sleep) PMSV Supervision: Intermittent         Oral Care Recommendations: Oral care BID Follow Up Recommendations: Acute inpatient rehab (3hours/day) Assistance  recommended  at discharge: PRN SLP Visit Diagnosis: Dysphagia, pharyngeal phase (R13.13) Plan: Continue with current plan of care       Pauline Pegues I. Hardin Negus, [place], [place] Office number [phone] Pager [place]  10/29/2021, 11:07 AM

## 2021-10-29 NOTE — TOC Progression Note (Addendum)
Transition of Care [facility]) - Progression Note    Patient Details  Name: Maureen Jones MRN: [id number] Date of Birth: 1966/11/28  Transition of [facility]) CM/SW [place], RN Phone [phone]  10/29/2021, 3:19 PM  Clinical Narrative:    CM received message from SW to establish [facility] agency for patient to d/c home with friend and [facility]. CM spoke with [name] [phone]. [name] states that she was told that she CM would be able to set up [facility] to have someone in the home to care for the patient at least 3 days per week. This CM has explained to friend that [facility] services will come out 2-3 times per week for an hour at the most to provide therapies. CM has informed friend that [facility] will not stay for extended hours. [name] explains that she would need someone to be there for her to do things like go to church. CM has explained that insurance does not pay to have sitters and that friend needs to be completely aware that total care would be the responsibility of the friend if she decides to take her home. Rayana states that if this had happened to her the patient would take care of her and she is going to take care of the patient. Patient is unable to make a decision on [facility] or any other services. She states that she will deal with it when it gets to that point.  11/18 PER [facility] [place] CAN NOT ACCEPT THIS [facility]   11/18 DME ordered rolling walker and 3in1   Expected Discharge Plan: [place] Barriers to Discharge: Continued Medical Work up  Expected Discharge Plan and Services Expected Discharge Plan: [place]   Discharge Planning Services: CM Consult Post Acute Care Choice: [place] arrangements for the past 2 months: Mobile Home                           HH Arranged: PT, OT, Speech Therapy HH Agency: [place] Date [place]: 09/29/21 Time [place]: 1620 Representative spoke with at [place]: [place] ([place]) Interventions    Readmission Risk Interventions No flowsheet data found.

## 2021-10-29 NOTE — Plan of Care (Signed)
Pt NSR on monitor and A/O x 4. Trach intact. Denies pain and SOB. Continue to encourage ambulation. Will continue to monitor. Will continue POC.   Problem: Health Behavior/Discharge Planning: Goal: Ability to manage health-related needs will improve Outcome: Progressing   Problem: Clinical Measurements: Goal: Ability to maintain clinical measurements within normal limits will improve Outcome: Progressing Goal: Will remain free from infection Outcome: Progressing Goal: Diagnostic test results will improve Outcome: Progressing Goal: Respiratory complications will improve Outcome: Progressing Goal: Cardiovascular complication will be avoided Outcome: Progressing   Problem: Activity: Goal: Risk for activity intolerance will decrease Outcome: Progressing   Problem: Nutrition: Goal: Adequate nutrition will be maintained Outcome: Progressing   Problem: Elimination: Goal: Will not experience complications related to bowel motility Outcome: Progressing Goal: Will not experience complications related to urinary retention Outcome: Progressing   Problem: Pain Managment: Goal: General experience of comfort will improve Outcome: Progressing   Problem: Coping: Goal: Will verbalize positive feelings about self Outcome: Progressing Goal: Will identify appropriate support needs Outcome: Progressing   Problem: Activity: Goal: Ability to tolerate increased activity will improve Outcome: Progressing   Problem: Nutrition: Goal: Risk of aspiration will decrease Outcome: Progressing Goal: Dietary intake will improve Outcome: Progressing   Problem: Intracerebral Hemorrhage Tissue Perfusion: Goal: Complications of Intracerebral Hemorrhage will be minimized Outcome: Progressing

## 2021-10-30 DIAGNOSIS — Z978 Presence of other specified devices: Secondary | ICD-10-CM | POA: Diagnosis not present

## 2021-10-30 DIAGNOSIS — G9340 Encephalopathy, unspecified: Secondary | ICD-10-CM | POA: Diagnosis not present

## 2021-10-30 LAB — BASIC METABOLIC PANEL
Anion gap: 6 (ref 5–15)
BUN: 29 mg/dL — ABNORMAL HIGH (ref 6–20)
CO2: 29 mmol/L (ref 22–32)
Calcium: 9.8 mg/dL (ref 8.9–10.3)
Chloride: 100 mmol/L (ref 98–111)
Creatinine, Ser: 1.81 mg/dL — ABNORMAL HIGH (ref 0.44–1.00)
GFR, Estimated: 33 mL/min — ABNORMAL LOW (ref >60)
Glucose, Bld: 280 mg/dL — ABNORMAL HIGH (ref 70–99)
Potassium: 4.4 mmol/L (ref 3.5–5.1)
Sodium: 135 mmol/L (ref 135–145)

## 2021-10-30 LAB — GLUCOSE, CAPILLARY
Glucose-Capillary: 250 mg/dL — ABNORMAL HIGH (ref 70–99)
Glucose-Capillary: 204 mg/dL — ABNORMAL HIGH (ref 70–99)
Glucose-Capillary: 241 mg/dL — ABNORMAL HIGH (ref 70–99)
Glucose-Capillary: 366 mg/dL — ABNORMAL HIGH (ref 70–99)

## 2021-10-30 LAB — CBC
HCT: 27 % — ABNORMAL LOW (ref 36.0–46.0)
Hemoglobin: 8 g/dL — ABNORMAL LOW (ref 12.0–15.0)
MCH: 28.9 pg (ref 26.0–34.0)
MCHC: 29.6 g/dL — ABNORMAL LOW (ref 30.0–36.0)
MCV: 97.5 fL (ref 80.0–100.0)
Platelets: 286 10*3/uL (ref 150–400)
RBC: 2.77 MIL/uL — ABNORMAL LOW (ref 3.87–5.11)
RDW: 15.3 % (ref 11.5–15.5)
WBC: 8.5 10*3/uL (ref 4.0–10.5)
nRBC: 0 % (ref 0.0–0.2)

## 2021-10-30 MED ORDER — FERROUS SULFATE 325 (65 FE) MG PO TABS
325.0000 mg | ORAL_TABLET | Freq: Every day | ORAL | Status: DC
  Administered 2021-10-30 – 2021-11-15 (×17): 325 mg via ORAL
  Filled 2021-10-30 (×17): qty 1

## 2021-10-30 MED ORDER — INSULIN DETEMIR 100 UNIT/ML ~~LOC~~ SOLN
16.0000 [IU] | Freq: Two times a day (BID) | SUBCUTANEOUS | Status: DC
  Administered 2021-10-30 – 2021-11-03 (×9): 16 [IU] via SUBCUTANEOUS
  Filled 2021-10-30 (×10): qty 0.16

## 2021-10-30 NOTE — Progress Notes (Signed)
PROGRESS NOTE    Maureen Jones  ZOX:[id number] DOB: 07-03-1966 DOA: 09/20/2021 PCP: Pcp, No   Brief Narrative: 55 year old with past medical history significant for hypertension and diabetes who presented to [facility] on 10/10 with report of acute altered mental status secondary to hypertensive emergency with hypoxic respiratory failure related to VCD/post intubation, supraglottic and glottic edema/vocal hoarseness plus or minus asthmatic exacerbation.  Ultimately required trach placement.  Transferred back to ICU on 11/2 after trach became dislodged again on the floor resulting in PEA arrest with 10 minutes CPR and 2 mg of epi.  ROSC obtained.  Trach revised to 6.0 XLT. Insurance denied CIR. Patient will need SNF or LTAC>   Significant events: 10/10 Admit with acute encephalopathy, hypertensive 10/12 extubated, increased WOB, re-intubated 10/14 extubated , lingering stridor, Received racemic epi x2 over the last 24 hours + another dose of Decadron, Mild agitation requiring restart of Precedex 10/15 swallow evaluation >> dysphagia 3 diet 10/20 decompensated after switch to PO steroids 10/21 better after CAT and back on IV steroids 10/22 agitated back on  BIPAP 10/25 stridor again. Marked work of breathing. Intubated, airway noises, stridor and wheezing completely resolved post intubation. Spoke to ENT who will follow after trach. Trach placed by Dr Vaughan Browner. Seroquel increased to 100 qhs, changes solumedrol to prednisone to begin taper, rested on propofol over night. Tubefeeds started. RUE PICC placed  10/26 prop weaning off attempting ATC  11/2 PEA arrest in setting of dislodged tracheostomy. questionable syncope.  Total 10 minutes CPR. Transferred to intensive care unit intubated via trach stoma for redo trach 11/2/202.  Redo tracheostomy. 6 XLT. 11/4 Concern overnight for STEMI, hep gtt started, cards consulted; this was artifact 11/5 changed out distal XLT for proximal due to positioning  within trachea 11/8 tolerated trach collar for about 6 hours 11/7, placed back on vent  11/14: Tracheostomy tube was changed from size 6 cuffed to size 5 uncuffed proximal XLT Shiley       Assessment & Plan:   Active Problems:   Encephalopathy acute   Endotracheal tube present   Acute respiratory failure (HCC)   Subglottic edema   Vocal cord dysfunction  1-Acute hypoxic respiratory failure in the setting of vocal cord dysfunction/supraglottic and glottic edema/asthma exacerbation: -Patient is status post tracheostomy. -She had dislodgment of her trach on 11/2 leading to PEA arrest.  Required CPR for 10 minutes following which she had ROSC.  -Respiratory status stable,  has not required ventilator for several days.  On trach collar -Trach  was changed to size 5 on cough.  Tolerated PMV -Continue with Guaifenesin.  -She is breathing ok. Down on 5 L oxygen.   Hypertension: Continue hydralazine and nitrates and carvedilol Continue with Norvasc. Continue with current regimen.   Diabetes type 2 uncontrolled hyperglycemia: Hemoglobin A1c 12 Continue with Levemir. Increase levemir 16 units.    Diabetes gastroparesis: KUB negative for obstruction Lipase and liver function test normal Improved on Reglan PPI  CKD  stage IIIb: Prior creatinine 1.7 last year in Care Everywhere. Renal function stable Cr 1.9---2 Remain stable.   Health Care associated pneumonia: Completed 7 days of antibiotics  Agitation; Seems to have resolved. Continue to wean down clonazepam and and Seroquel gradually Last modification to Seroquel 11/10  Normocytic  anemia: Likely anemia of critical illness.  Monitor hemoglobin Will start oral iron trial.   Hypomagnesemia and hypophosphatemia: Replaced.   Dysphagia/nutrition: 2 tracheostomy No longer on tube feedings. On regular diet, upgraded to liquid diet  today     Nutrition Problem: Inadequate oral intake Etiology: inability to  eat    Signs/Symptoms: NPO status    Interventions: Hormel Shake  Estimated body mass index is 35.58 kg/m as calculated from the following:   Height as of this encounter: 5\' 6"  (1.676 m).   Weight as of this encounter: 100 kg.   DVT prophylaxis: Heparin Code Status: Partial code Family Communication: Care discussed with patient Disposition Plan:  Status is: Inpatient  Remains inpatient appropriate because: Patient admitted with altered mental status in the setting of hypertensive emergency, subsequently developed respiratory failure and required trach        Consultants:  CCM ENT  Procedures:    Antimicrobials:    Subjective: She is doing well, denies worsening dyspnea or cough.   Objective: Vitals:   10/30/21 0500 10/30/21 0836 10/30/21 0846 10/30/21 0854  BP:    (!) 153/61  Pulse:   98 100  Resp:   19 20  Temp:    98.4 F (36.9 C)  TempSrc:    Oral  SpO2:  94% 100% 97%  Weight: 100 kg     Height:       No intake or output data in the 24 hours ending 10/30/21 1150  Filed Weights   10/26/21 0500 10/29/21 0500 10/30/21 0500  Weight: 99.8 kg 100.3 kg 100 kg    Examination:  General exam: NAD, Trach in place Respiratory system: No wheezing no ronchus Cardiovascular system: S 1, S 2 RRR Gastrointestinal system: BS present, soft, nt Central nervous system: Alert follows command Extremities: No edema Skin: No rashes  Data Reviewed: I have personally reviewed following labs and imaging studies  CBC: Recent Labs  Lab 10/25/21 0415 10/27/21 0114 10/28/21 0059 10/30/21 0015  WBC 11.3* 10.4 8.9 8.5  HGB 7.8* 8.1* 8.0* 8.0*  HCT 26.6* 27.0* 26.9* 27.0*  MCV 97.1 95.7 96.4 97.5  PLT 317 321 301 944    Basic Metabolic Panel: Recent Labs  Lab 10/25/21 0415 10/27/21 0114 10/28/21 0059 10/30/21 0015  NA 138 137 135 135  K 3.9 4.1 4.4 4.4  CL 103 101 99 100  CO2 27 28 26 29   GLUCOSE 159* 228* 264* 280*  BUN 31* 33* 33* 29*   CREATININE 2.05* 2.03* 1.92* 1.81*  CALCIUM 9.0 9.6 9.5 9.8  MG 1.8 1.6* 2.0  --   PHOS 3.6  --  2.8  --     GFR: Estimated Creatinine Clearance: 42.4 mL/min (A) (by C-G formula based on SCr of 1.81 mg/dL (H)). Liver Function Tests: Recent Labs  Lab 10/25/21 0415  AST 19  ALT 17  ALKPHOS 119  BILITOT 0.4  PROT 5.5*  ALBUMIN 2.1*    No results for input(s): LIPASE, AMYLASE in the last 168 hours. No results for input(s): AMMONIA in the last 168 hours. Coagulation Profile: No results for input(s): INR, PROTIME in the last 168 hours. Cardiac Enzymes: No results for input(s): CKTOTAL, CKMB, CKMBINDEX, TROPONINI in the last 168 hours. BNP (last 3 results) No results for input(s): PROBNP in the last 8760 hours. HbA1C: No results for input(s): HGBA1C in the last 72 hours. CBG: Recent Labs  Lab 10/29/21 0827 10/29/21 1207 10/29/21 1646 10/29/21 2126 10/30/21 0902  GLUCAP 209* 282* 244* 255* 250*    Lipid Profile: No results for input(s): CHOL, HDL, LDLCALC, TRIG, CHOLHDL, LDLDIRECT in the last 72 hours. Thyroid Function Tests: No results for input(s): TSH, T4TOTAL, FREET4, T3FREE, THYROIDAB in the last 72  hours. Anemia Panel: No results for input(s): VITAMINB12, FOLATE, FERRITIN, TIBC, IRON, RETICCTPCT in the last 72 hours. Sepsis Labs: No results for input(s): PROCALCITON, LATICACIDVEN in the last 168 hours.  No results found for this or any previous visit (from the past 240 hour(s)).       Radiology Studies: No results found.      Scheduled Meds:  amLODipine  5 mg Oral Daily   arformoterol  15 mcg Nebulization BID   aspirin  81 mg Oral Daily   budesonide (PULMICORT) nebulizer solution  0.25 mg Nebulization BID   carvedilol  12.5 mg Oral BID   chlorhexidine  15 mL Mouth Rinse BID   Chlorhexidine Gluconate Cloth  6 each Topical Daily   clonazePAM  0.5 mg Oral BID   DULoxetine  60 mg Oral Daily   ferrous sulfate  325 mg Oral Q breakfast   fluticasone   2 spray Each Nare Daily   guaiFENesin  1,200 mg Oral BID   heparin injection (subcutaneous)  5,000 Units Subcutaneous Q8H   hydrALAZINE  100 mg Oral Q8H   insulin aspart  0-15 Units Subcutaneous TID WC   insulin aspart  0-5 Units Subcutaneous QHS   insulin detemir  16 Units Subcutaneous BID   isosorbide dinitrate  10 mg Oral BID   lidocaine  2 patch Transdermal Q24H   loratadine  10 mg Oral Daily   mouth rinse  15 mL Mouth Rinse q12n4p   metoCLOPramide  5 mg Oral TID AC   montelukast  10 mg Oral QHS   pantoprazole  40 mg Oral BID   QUEtiapine  50 mg Oral BID   sodium chloride flush  10-40 mL Intracatheter Q12H   Continuous Infusions:  sodium chloride Stopped (10/21/21 0722)     LOS: 40 days    Time spent: 35 minutes    Maureen Jones A Gicela Schwarting, MD [facility]   If 7PM-7AM, please contact night-coverage [email or web address]  10/30/2021, 11:50 AM

## 2021-10-30 NOTE — Progress Notes (Signed)
I received a call from the patient's nurse stating that the patient was having some shortness of breath, upon entering the room patient's SATS was 89% on 28% ATC, auscultated the patient's lungs and found that she was congested, attempted to suction the trach, but felt a resistance, removed the inner cannula and found that the end of the inner cannula tube was crimped and not allowing the oxygen to pass through into her lungs, as soon as the inner cannula was removed SATS improved to 98% and patient stated that the shortness of breath improved significantly, there was no spare inner cannulas at the bedside, I informed the unit secretary and the nurse that we need to order a box of inner cannulas size #5 XLT shiley, which the secretary ordered and stated that he would call me as soon as the inner cannulas arrived on the unit, left the inner cannulas out of the patient's trach out until we get the new ones, RN informed.

## 2021-10-31 DIAGNOSIS — G9340 Encephalopathy, unspecified: Secondary | ICD-10-CM | POA: Diagnosis not present

## 2021-10-31 LAB — GLUCOSE, CAPILLARY
Glucose-Capillary: 235 mg/dL — ABNORMAL HIGH (ref 70–99)
Glucose-Capillary: 171 mg/dL — ABNORMAL HIGH (ref 70–99)
Glucose-Capillary: 161 mg/dL — ABNORMAL HIGH (ref 70–99)
Glucose-Capillary: 167 mg/dL — ABNORMAL HIGH (ref 70–99)

## 2021-10-31 MED ORDER — INSULIN ASPART 100 UNIT/ML IJ SOLN
4.0000 [IU] | Freq: Three times a day (TID) | INTRAMUSCULAR | Status: DC
Start: 2021-10-31 — End: 2021-11-03
  Administered 2021-10-31 – 2021-11-03 (×11): 4 [IU] via SUBCUTANEOUS

## 2021-10-31 MED ORDER — AMLODIPINE BESYLATE 10 MG PO TABS
10.0000 mg | ORAL_TABLET | Freq: Every day | ORAL | Status: DC
  Administered 2021-11-01 – 2021-11-15 (×15): 10 mg via ORAL
  Filled 2021-10-31 (×15): qty 1

## 2021-10-31 NOTE — Progress Notes (Signed)
PROGRESS NOTE    Maureen Jones  DDU:[id number] DOB: Jun 06, 1966 DOA: 09/20/2021 PCP: Pcp, No   Brief Narrative: 55 year old with past medical history significant for hypertension and diabetes who presented to [facility] on 10/10 with report of acute altered mental status secondary to hypertensive emergency with hypoxic respiratory failure related to VCD/post intubation, supraglottic and glottic edema/vocal hoarseness plus or minus asthmatic exacerbation.  Ultimately required trach placement.  Transferred back to ICU on 11/2 after trach became dislodged again on the floor resulting in PEA arrest with 10 minutes CPR and 2 mg of epi.  ROSC obtained.  Trach revised to 6.0 XLT. Insurance denied CIR. Patient will need SNF or LTAC>   Significant events: 10/10 Admit with acute encephalopathy, hypertensive 10/12 extubated, increased WOB, re-intubated 10/14 extubated , lingering stridor, Received racemic epi x2 over the last 24 hours + another dose of Decadron, Mild agitation requiring restart of Precedex 10/15 swallow evaluation >> dysphagia 3 diet 10/20 decompensated after switch to PO steroids 10/21 better after CAT and back on IV steroids 10/22 agitated back on  BIPAP 10/25 stridor again. Marked work of breathing. Intubated, airway noises, stridor and wheezing completely resolved post intubation. Spoke to ENT who will follow after trach. Trach placed by Dr Vaughan Browner. Seroquel increased to 100 qhs, changes solumedrol to prednisone to begin taper, rested on propofol over night. Tubefeeds started. RUE PICC placed  10/26 prop weaning off attempting ATC  11/2 PEA arrest in setting of dislodged tracheostomy. questionable syncope.  Total 10 minutes CPR. Transferred to intensive care unit intubated via trach stoma for redo trach 11/2/202.  Redo tracheostomy. 6 XLT. 11/4 Concern overnight for STEMI, hep gtt started, cards consulted; this was artifact 11/5 changed out distal XLT for proximal due to positioning  within trachea 11/8 tolerated trach collar for about 6 hours 11/7, placed back on vent  11/14: Tracheostomy tube was changed from size 6 cuffed to size 5 uncuffed proximal XLT Shiley       Assessment & Plan:   Active Problems:   Encephalopathy acute   Endotracheal tube present   Acute respiratory failure (HCC)   Subglottic edema   Vocal cord dysfunction  1-Acute hypoxic respiratory failure in the setting of vocal cord dysfunction/supraglottic and glottic edema/asthma exacerbation: -Patient is status post tracheostomy. -She had dislodgment of her trach on 11/2 leading to PEA arrest.  Required CPR for 10 minutes following which she had ROSC.  -Respiratory status stable,  has not required ventilator for several days.  On trach collar -Trach  was changed to size 5 on cough.  Tolerated PMV -Continue with Guaifenesin.  -She is breathing ok. Down on 5 L oxygen.  -Event last night with inner cannula crimped. This was exchange. She is breathing well. Nursing has been doing trach care.  She is breathing well.   Hypertension: Continue hydralazine and nitrates and carvedilol Continue with Norvasc. Will increase norvasc to 10 mg.   Diabetes type 2 uncontrolled hyperglycemia: Hemoglobin A1c 12 On  levemir 16 units.  Will add meals coverage 4 units novolog.   Diabetes gastroparesis: KUB negative for obstruction Lipase and liver function test normal Improved on Reglan PPI  CKD  stage IIIb: Prior creatinine 1.7 last year in Care Everywhere. Renal function stable Cr 1.9---2 Remain stable.   Health Care associated pneumonia: Completed 7 days of antibiotics  Agitation; Seems to have resolved. Continue to wean down clonazepam and and Seroquel gradually Last modification to Seroquel 11/10  Normocytic  anemia: Likely anemia of  critical illness.  Monitor hemoglobin Started  oral iron trial.   Hypomagnesemia and hypophosphatemia: Replaced.   Dysphagia/nutrition: 2 tracheostomy No  longer on tube feedings. On regular diet, upgraded to liquid diet today     Nutrition Problem: Inadequate oral intake Etiology: inability to eat    Signs/Symptoms: NPO status    Interventions: Hormel Shake  Estimated body mass index is 36.54 kg/m as calculated from the following:   Height as of this encounter: 5\' 6"  (1.676 m).   Weight as of this encounter: 102.7 kg.   DVT prophylaxis: Heparin Code Status: Partial code Family Communication: Care discussed with patient Disposition Plan:  Status is: Inpatient  Remains inpatient appropriate because: Patient admitted with altered mental status in the setting of hypertensive emergency, subsequently developed respiratory failure and required trach        Consultants:  CCM ENT  Procedures:    Antimicrobials:    Subjective She is resting. Breathing well.   Objective: Vitals:   10/31/21 0808 10/31/21 1141 10/31/21 1217 10/31/21 1506  BP: (!) 186/82  (!) 145/75   Pulse: 91 74 77 96  Resp: 16 16 18 17   Temp: 98.4 F (36.9 C)  99.1 F (37.3 C)   TempSrc: Axillary  Oral   SpO2: 100% 100% 97% 100%  Weight:      Height:        Intake/Output Summary (Last 24 hours) at 10/31/2021 1509 Last data filed at 10/31/2021 0424 Gross per 24 hour  Intake --  Output 1600 ml  Net -1600 ml    Filed Weights   10/29/21 0500 10/30/21 0500 10/31/21 0435  Weight: 100.3 kg 100 kg 102.7 kg    Examination:  General exam: NAD, Trach in place Respiratory system: BL air movement Cardiovascular system: S 1, S 2 RRR Gastrointestinal system: BS present, sot, nt Central nervous system: Alert, follows command Extremities: No edema Skin: No rashes  Data Reviewed: I have personally reviewed following labs and imaging studies  CBC: Recent Labs  Lab 10/25/21 0415 10/27/21 0114 10/28/21 0059 10/30/21 0015  WBC 11.3* 10.4 8.9 8.5  HGB 7.8* 8.1* 8.0* 8.0*  HCT 26.6* 27.0* 26.9* 27.0*  MCV 97.1 95.7 96.4 97.5  PLT 317  321 301 546    Basic Metabolic Panel: Recent Labs  Lab 10/25/21 0415 10/27/21 0114 10/28/21 0059 10/30/21 0015  NA 138 137 135 135  K 3.9 4.1 4.4 4.4  CL 103 101 99 100  CO2 27 28 26 29   GLUCOSE 159* 228* 264* 280*  BUN 31* 33* 33* 29*  CREATININE 2.05* 2.03* 1.92* 1.81*  CALCIUM 9.0 9.6 9.5 9.8  MG 1.8 1.6* 2.0  --   PHOS 3.6  --  2.8  --     GFR: Estimated Creatinine Clearance: 43 mL/min (A) (by C-G formula based on SCr of 1.81 mg/dL (H)). Liver Function Tests: Recent Labs  Lab 10/25/21 0415  AST 19  ALT 17  ALKPHOS 119  BILITOT 0.4  PROT 5.5*  ALBUMIN 2.1*    No results for input(s): LIPASE, AMYLASE in the last 168 hours. No results for input(s): AMMONIA in the last 168 hours. Coagulation Profile: No results for input(s): INR, PROTIME in the last 168 hours. Cardiac Enzymes: No results for input(s): CKTOTAL, CKMB, CKMBINDEX, TROPONINI in the last 168 hours. BNP (last 3 results) No results for input(s): PROBNP in the last 8760 hours. HbA1C: No results for input(s): HGBA1C in the last 72 hours. CBG: Recent Labs  Lab 10/30/21 1233  10/30/21 1735 10/30/21 2112 10/31/21 0807 10/31/21 1224  GLUCAP 204* 241* 366* 235* 171*    Lipid Profile: No results for input(s): CHOL, HDL, LDLCALC, TRIG, CHOLHDL, LDLDIRECT in the last 72 hours. Thyroid Function Tests: No results for input(s): TSH, T4TOTAL, FREET4, T3FREE, THYROIDAB in the last 72 hours. Anemia Panel: No results for input(s): VITAMINB12, FOLATE, FERRITIN, TIBC, IRON, RETICCTPCT in the last 72 hours. Sepsis Labs: No results for input(s): PROCALCITON, LATICACIDVEN in the last 168 hours.  No results found for this or any previous visit (from the past 240 hour(s)).       Radiology Studies: No results found.      Scheduled Meds:  amLODipine  5 mg Oral Daily   arformoterol  15 mcg Nebulization BID   aspirin  81 mg Oral Daily   budesonide (PULMICORT) nebulizer solution  0.25 mg Nebulization BID    carvedilol  12.5 mg Oral BID   chlorhexidine  15 mL Mouth Rinse BID   Chlorhexidine Gluconate Cloth  6 each Topical Daily   clonazePAM  0.5 mg Oral BID   DULoxetine  60 mg Oral Daily   ferrous sulfate  325 mg Oral Q breakfast   fluticasone  2 spray Each Nare Daily   guaiFENesin  1,200 mg Oral BID   heparin injection (subcutaneous)  5,000 Units Subcutaneous Q8H   hydrALAZINE  100 mg Oral Q8H   insulin aspart  0-15 Units Subcutaneous TID WC   insulin aspart  0-5 Units Subcutaneous QHS   insulin aspart  4 Units Subcutaneous TID WC   insulin detemir  16 Units Subcutaneous BID   isosorbide dinitrate  10 mg Oral BID   lidocaine  2 patch Transdermal Q24H   loratadine  10 mg Oral Daily   mouth rinse  15 mL Mouth Rinse q12n4p   metoCLOPramide  5 mg Oral TID AC   montelukast  10 mg Oral QHS   pantoprazole  40 mg Oral BID   QUEtiapine  50 mg Oral BID   sodium chloride flush  10-40 mL Intracatheter Q12H   Continuous Infusions:  sodium chloride Stopped (10/21/21 0722)     LOS: 41 days    Time spent: 35 minutes    Corrinne Benegas A Kiahna Banghart, MD [facility]   If 7PM-7AM, please contact night-coverage [email or web address]  10/31/2021, 3:09 PM

## 2021-11-01 DIAGNOSIS — G9340 Encephalopathy, unspecified: Secondary | ICD-10-CM | POA: Diagnosis not present

## 2021-11-01 LAB — CBC
HCT: 27.3 % — ABNORMAL LOW (ref 36.0–46.0)
Hemoglobin: 8.1 g/dL — ABNORMAL LOW (ref 12.0–15.0)
MCH: 28.4 pg (ref 26.0–34.0)
MCHC: 29.7 g/dL — ABNORMAL LOW (ref 30.0–36.0)
MCV: 95.8 fL (ref 80.0–100.0)
Platelets: 257 10*3/uL (ref 150–400)
RBC: 2.85 MIL/uL — ABNORMAL LOW (ref 3.87–5.11)
RDW: 15.2 % (ref 11.5–15.5)
WBC: 9 10*3/uL (ref 4.0–10.5)
nRBC: 0 % (ref 0.0–0.2)

## 2021-11-01 LAB — BASIC METABOLIC PANEL
Anion gap: 8 (ref 5–15)
BUN: 26 mg/dL — ABNORMAL HIGH (ref 6–20)
CO2: 30 mmol/L (ref 22–32)
Calcium: 9.9 mg/dL (ref 8.9–10.3)
Chloride: 101 mmol/L (ref 98–111)
Creatinine, Ser: 1.81 mg/dL — ABNORMAL HIGH (ref 0.44–1.00)
GFR, Estimated: 33 mL/min — ABNORMAL LOW (ref >60)
Glucose, Bld: 251 mg/dL — ABNORMAL HIGH (ref 70–99)
Potassium: 4.3 mmol/L (ref 3.5–5.1)
Sodium: 139 mmol/L (ref 135–145)

## 2021-11-01 LAB — GLUCOSE, CAPILLARY
Glucose-Capillary: 182 mg/dL — ABNORMAL HIGH (ref 70–99)
Glucose-Capillary: 197 mg/dL — ABNORMAL HIGH (ref 70–99)
Glucose-Capillary: 191 mg/dL — ABNORMAL HIGH (ref 70–99)
Glucose-Capillary: 151 mg/dL — ABNORMAL HIGH (ref 70–99)

## 2021-11-01 MED ORDER — WHITE PETROLATUM EX OINT
TOPICAL_OINTMENT | CUTANEOUS | Status: AC
  Filled 2021-11-01: qty 28.35

## 2021-11-01 MED ORDER — IPRATROPIUM-ALBUTEROL 0.5-2.5 (3) MG/3ML IN SOLN
3.0000 mL | Freq: Three times a day (TID) | RESPIRATORY_TRACT | Status: DC
Start: 2021-11-01 — End: 2021-11-03
  Administered 2021-11-01 – 2021-11-03 (×7): 3 mL via RESPIRATORY_TRACT
  Filled 2021-11-01 (×7): qty 3

## 2021-11-01 NOTE — Progress Notes (Signed)
PROGRESS NOTE    Maureen Jones  ZOX:[id number] DOB: 12-Jan-1966 DOA: 09/20/2021 PCP: Pcp, No   Brief Narrative: 55 year old with past medical history significant for hypertension and diabetes who presented to [facility] on 10/10 with report of acute altered mental status secondary to hypertensive emergency with hypoxic respiratory failure related to VCD/post intubation, supraglottic and glottic edema/vocal hoarseness plus or minus asthmatic exacerbation.  Ultimately required trach placement.  Transferred back to ICU on 11/2 after trach became dislodged again on the floor resulting in PEA arrest with 10 minutes CPR and 2 mg of epi.  ROSC obtained.  Trach revised to 6.0 XLT. Insurance denied CIR. Patient will need SNF or LTAC>   Significant events: 10/10 Admit with acute encephalopathy, hypertensive 10/12 extubated, increased WOB, re-intubated 10/14 extubated , lingering stridor, Received racemic epi x2 over the last 24 hours + another dose of Decadron, Mild agitation requiring restart of Precedex 10/15 swallow evaluation >> dysphagia 3 diet 10/20 decompensated after switch to PO steroids 10/21 better after CAT and back on IV steroids 10/22 agitated back on  BIPAP 10/25 stridor again. Marked work of breathing. Intubated, airway noises, stridor and wheezing completely resolved post intubation. Spoke to ENT who will follow after trach. Trach placed by Dr Vaughan Browner. Seroquel increased to 100 qhs, changes solumedrol to prednisone to begin taper, rested on propofol over night. Tubefeeds started. RUE PICC placed  10/26 prop weaning off attempting ATC  11/2 PEA arrest in setting of dislodged tracheostomy. questionable syncope.  Total 10 minutes CPR. Transferred to intensive care unit intubated via trach stoma for redo trach 11/2/202.  Redo tracheostomy. 6 XLT. 11/4 Concern overnight for STEMI, hep gtt started, cards consulted; this was artifact 11/5 changed out distal XLT for proximal due to positioning  within trachea 11/8 tolerated trach collar for about 6 hours 11/7, placed back on vent  11/14: Tracheostomy tube was changed from size 6 cuffed to size 5 uncuffed proximal XLT [place]       Assessment & Plan:   Active Problems:   Encephalopathy acute   Endotracheal tube present   Acute respiratory failure (HCC)   Subglottic edema   Vocal cord dysfunction  1-Acute hypoxic respiratory failure in the setting of vocal cord dysfunction/supraglottic and glottic edema/asthma exacerbation: -Patient is status post tracheostomy. -She had dislodgment of her trach on 11/2 leading to PEA arrest.  Required CPR for 10 minutes following which she had ROSC.  -Respiratory status stable,  has not required ventilator for several days.  On trach collar -Trach  was changed to size 5 on cough.  Tolerated PMV -Continue with Guaifenesin.  -She is breathing ok. Down on 5 L oxygen.  -Event last night with inner cannula crimped. This was exchange. She is breathing well. Nursing has been doing trach care.  -Insurance decline CIR. Plan for Home with Home health.  -Will ask CCM to follow up on patient for trach.  -schedule duo neb.   Hypertension: Continue hydralazine and nitrates and carvedilol Continue with Norvasc.  Diabetes type 2 uncontrolled hyperglycemia: Hemoglobin A1c 12 On  levemir 16 units.  Continue with meals coverage 4 units novolog.   Diabetes gastroparesis: KUB negative for obstruction. Lipase and liver function test normal. Improved on Reglan. PPI.  CKD  stage IIIb: Prior creatinine 1.7 last year in Care Everywhere. Renal function stable Cr 1.9---2 Remain stable.   Health Care associated pneumonia: Completed 7 days of antibiotics  Agitation; Seems to have resolved. Continue to wean down clonazepam and and Seroquel  gradually Last modification to Seroquel 11/10  Normocytic  anemia: Likely anemia of critical illness.  Monitor hemoglobin Started  oral iron trial.    Hypomagnesemia and hypophosphatemia: Replaced.   Dysphagia/nutrition: 2 tracheostomy No longer on tube feedings. On regular diet, upgraded to liquid diet today     Nutrition Problem: Inadequate oral intake Etiology: inability to eat    Signs/Symptoms: NPO status    Interventions: Hormel Shake  Estimated body mass index is 36.33 kg/m as calculated from the following:   Height as of this encounter: 5\' 6"  (1.676 m).   Weight as of this encounter: 102.1 kg.   DVT prophylaxis: Heparin Code Status: Partial code Family Communication: Care discussed with patient Disposition Plan:  Status is: Inpatient  Remains inpatient appropriate because: Patient admitted with altered mental status in the setting of hypertensive emergency, subsequently developed respiratory failure and required trach        Consultants:  CCM ENT  Procedures:    Antimicrobials:    Subjective Complaints of sinus drainage. Increase flonase.  Breathing ok.   Objective: Vitals:   11/01/21 0740 11/01/21 0805 11/01/21 1117 11/01/21 1120  BP: (!) 182/87   (!) 154/76  Pulse: 90 97 91 90  Resp: 17 17 16 14   Temp: 98.5 F (36.9 C)   99 F (37.2 C)  TempSrc: Oral   Oral  SpO2: 95% 96% 99% 100%  Weight:      Height:        Intake/Output Summary (Last 24 hours) at 11/01/2021 1225 Last data filed at 11/01/2021 0424 Gross per 24 hour  Intake --  Output 1350 ml  Net -1350 ml    Filed Weights   10/30/21 0500 10/31/21 0435 11/01/21 0400  Weight: 100 kg 102.7 kg 102.1 kg    Examination:  General exam: NAD, Trach in place Respiratory system: BL ronchus.  Cardiovascular system: S 1, S 2 RRR Gastrointestinal system: BS present, soft, nt Central nervous system: Alert, follows command Extremities: No edema Skin: No rashes  Data Reviewed: I have personally reviewed following labs and imaging studies  CBC: Recent Labs  Lab 10/27/21 0114 10/28/21 0059 10/30/21 0015 11/01/21 0030   WBC 10.4 8.9 8.5 9.0  HGB 8.1* 8.0* 8.0* 8.1*  HCT 27.0* 26.9* 27.0* 27.3*  MCV 95.7 96.4 97.5 95.8  PLT 321 301 286 641    Basic Metabolic Panel: Recent Labs  Lab 10/27/21 0114 10/28/21 0059 10/30/21 0015 11/01/21 0030  NA 137 135 135 139  K 4.1 4.4 4.4 4.3  CL 101 99 100 101  CO2 28 26 29 30   GLUCOSE 228* 264* 280* 251*  BUN 33* 33* 29* 26*  CREATININE 2.03* 1.92* 1.81* 1.81*  CALCIUM 9.6 9.5 9.8 9.9  MG 1.6* 2.0  --   --   PHOS  --  2.8  --   --     GFR: Estimated Creatinine Clearance: 42.9 mL/min (A) (by C-G formula based on SCr of 1.81 mg/dL (H)). Liver Function Tests: No results for input(s): AST, ALT, ALKPHOS, BILITOT, PROT, ALBUMIN in the last 168 hours.  No results for input(s): LIPASE, AMYLASE in the last 168 hours. No results for input(s): AMMONIA in the last 168 hours. Coagulation Profile: No results for input(s): INR, PROTIME in the last 168 hours. Cardiac Enzymes: No results for input(s): CKTOTAL, CKMB, CKMBINDEX, TROPONINI in the last 168 hours. BNP (last 3 results) No results for input(s): PROBNP in the last 8760 hours. HbA1C: No results for input(s): HGBA1C in the  last 72 hours. CBG: Recent Labs  Lab 10/31/21 1224 10/31/21 1619 10/31/21 2011 11/01/21 0740 11/01/21 1119  GLUCAP 171* 161* 167* 182* 197*    Lipid Profile: No results for input(s): CHOL, HDL, LDLCALC, TRIG, CHOLHDL, LDLDIRECT in the last 72 hours. Thyroid Function Tests: No results for input(s): TSH, T4TOTAL, FREET4, T3FREE, THYROIDAB in the last 72 hours. Anemia Panel: No results for input(s): VITAMINB12, FOLATE, FERRITIN, TIBC, IRON, RETICCTPCT in the last 72 hours. Sepsis Labs: No results for input(s): PROCALCITON, LATICACIDVEN in the last 168 hours.  No results found for this or any previous visit (from the past 240 hour(s)).       Radiology Studies: No results found.      Scheduled Meds:  amLODipine  10 mg Oral Daily   arformoterol  15 mcg Nebulization BID    aspirin  81 mg Oral Daily   budesonide (PULMICORT) nebulizer solution  0.25 mg Nebulization BID   carvedilol  12.5 mg Oral BID   chlorhexidine  15 mL Mouth Rinse BID   Chlorhexidine Gluconate Cloth  6 each Topical Daily   clonazePAM  0.5 mg Oral BID   DULoxetine  60 mg Oral Daily   ferrous sulfate  325 mg Oral Q breakfast   fluticasone  2 spray Each Nare Daily   guaiFENesin  1,200 mg Oral BID   heparin injection (subcutaneous)  5,000 Units Subcutaneous Q8H   hydrALAZINE  100 mg Oral Q8H   insulin aspart  0-15 Units Subcutaneous TID WC   insulin aspart  0-5 Units Subcutaneous QHS   insulin aspart  4 Units Subcutaneous TID WC   insulin detemir  16 Units Subcutaneous BID   ipratropium-albuterol  3 mL Nebulization TID   isosorbide dinitrate  10 mg Oral BID   lidocaine  2 patch Transdermal Q24H   loratadine  10 mg Oral Daily   mouth rinse  15 mL Mouth Rinse q12n4p   metoCLOPramide  5 mg Oral TID AC   montelukast  10 mg Oral QHS   pantoprazole  40 mg Oral BID   QUEtiapine  50 mg Oral BID   sodium chloride flush  10-40 mL Intracatheter Q12H   Continuous Infusions:  sodium chloride Stopped (10/21/21 0722)     LOS: 42 days    Time spent: 35 minutes    Shatisha Falter A Bristal Steffy, MD [facility]   If 7PM-7AM, please contact night-coverage [email or web address]  11/01/2021, 12:25 PM

## 2021-11-01 NOTE — Progress Notes (Signed)
Occupational Therapy Treatment Patient Details Name: Maureen Jones MRN: [id number] DOB: 1966/01/30 Today's Date: 11/01/2021   History of present illness 55 y.o. female admitted 09/20/21 with AMS. 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 CXR with low lung volumes, L>R interstitial opacities. Acute metabolic encephalopathy in setting of HTN urgency, possible PRES. ETT (10/10-10/12, 10/12-10/14). Course complicated by confusion/agitation overnight 10/17. 10/25 trach and bronch with return to vent.  11/2 pt passed out while returning from the bathroom pt with respiratory distress from disloged trach leading to PEA arrest with CPR x 10 min. ETT via stoma.  Trach revised 11/2.  PMHx:HTN, DM.   OT comments  Patient progressing well towards OT goals.  Completes bed mobility with supervision, transfers with supervision and LB dressing with supervision today. She fatigues easily and reports "I'm just weak, I haven't been doing anything".  Completed theraband exercises- encouraged pt to complete at least 1 more time today.  Discussed plan for home, reports will have good support but concerned about trach care once medically stable to dc home.  Pt tearful during session, hopeful to talk to [place] Investment banker, corporate notified).  Will follow acutely.    Recommendations for follow up therapy are one component of a multi-disciplinary discharge planning process, led by the attending physician.  Recommendations may be updated based on patient status, additional functional criteria and insurance authorization.    Follow Up Recommendations  Home health OT    Assistance Recommended at Discharge Frequent or constant Supervision/Assistance  Equipment Recommendations  BSC/3in1    Recommendations for Other Services Other (comment) (chaplin consult)    Precautions / Restrictions Precautions Precautions: Fall;Other (comment) Precaution Comments: trach Restrictions Weight Bearing Restrictions: No       Mobility Bed  Mobility Overal bed mobility: Needs Assistance       Supine to sit: Supervision     General bed mobility comments: for lines    Transfers Overall transfer level: Needs assistance   Transfers: Sit to/from Stand Sit to Stand: Supervision           General transfer comment: for safety, line mgmt     Balance Overall balance assessment: Needs assistance Sitting-balance support: Feet supported Sitting balance-[name]: Good     Standing balance support: No upper extremity supported;During functional activity Standing balance-[name]: Fair                             ADL either performed or assessed with clinical judgement   ADL Overall ADL's : Needs assistance/impaired                     Lower Body Dressing: Supervision/safety;Sit to/from stand   Toilet Transfer: Supervision/safety;Ambulation Toilet Transfer Details (indicate cue type and reason): simulated to recliner Toileting- Clothing Manipulation and Hygiene: Supervision/safety;Sit to/from stand       Functional mobility during ADLs: Supervision/safety;Cueing for safety      Extremity/Trunk Assessment              Vision       Perception     Praxis      Cognition Arousal/Alertness: Awake/alert Behavior During Therapy: WFL for tasks assessed/performed Overall Cognitive Status: Within Functional Limits for tasks assessed                                 General Comments: labile during session, reports down about situtation  Exercises Exercises: General Upper Extremity General Exercises - Upper Extremity Shoulder Flexion: Strengthening;Both;10 reps;Seated Theraband Level (Shoulder Flexion): Level 2 (Red) Shoulder Horizontal ABduction: Strengthening;Both;5 reps;Seated   Shoulder Instructions       General Comments 5L 28% FiO2 via trach collar with VSS    Pertinent Vitals/ Pain       Pain Assessment: No/denies pain  Home Living                                           Prior Functioning/Environment              Frequency  Min 2X/week        Progress Toward Goals  OT Goals(current goals can now be found in the care plan section)  Progress towards OT goals: Progressing toward goals  Acute Rehab OT Goals OT Goal Formulation: With patient Time For Goal Achievement: 11/10/21  Plan Discharge plan remains appropriate;Frequency remains appropriate    Co-evaluation                 AM-PAC OT "6 Clicks" Daily Activity     Outcome Measure   Help from another person eating meals?: None Help from another person taking care of personal grooming?: A Little Help from another person toileting, which includes using toliet, bedpan, or urinal?: A Little Help from another person bathing (including washing, rinsing, drying)?: A Little Help from another person to put on and taking off regular upper body clothing?: A Little Help from another person to put on and taking off regular lower body clothing?: A Little 6 Click Score: 19    End of Session Equipment Utilized During Treatment: Oxygen (via trach)  OT Visit Diagnosis: Other abnormalities of gait and mobility (R26.89)   Activity Tolerance Patient tolerated treatment well   Patient Left in chair;with call bell/phone within reach;with chair alarm set   Nurse Communication Mobility status        Time: 6767-2094 OT Time Calculation (min): 34 min  Charges: OT General Charges $OT Visit: 1 Visit OT Treatments $Self Care/Home Management : 8-22 mins $Therapeutic Exercise: 8-22 mins  Maureen Jones, OT Acute Rehabilitation Services Pager [phone] Office [phone]   Maureen Jones 11/01/2021, 12:36 PM

## 2021-11-01 NOTE — Progress Notes (Signed)
Physical Therapy Treatment Patient Details Name: Maureen Jones MRN: [id number] DOB: August 05, 1966 Today's Date: 11/01/2021   History of Present Illness 55 y.o. female admitted 09/20/21 with AMS. 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 CXR with low lung volumes, L>R interstitial opacities. Acute metabolic encephalopathy in setting of HTN urgency, possible PRES. ETT (10/10-10/12, 10/12-10/14). Course complicated by confusion/agitation overnight 10/17. 10/25 trach and bronch with return to vent.  11/2 pt passed out while returning from the bathroom pt with respiratory distress from disloged trach leading to PEA arrest with CPR x 10 min. ETT via stoma.  Trach revised 11/2.  PMHx:HTN, DM.    PT Comments    Patient continues to make steady progress with her mobility. Today she was able to ambulate with PMV and on room air with sats at lowest 88% (generally 91-92%). She even had enough energy to complete exercises on return to room.    Recommendations for follow up therapy are one component of a multi-disciplinary discharge planning process, led by the attending physician.  Recommendations may be updated based on patient status, additional functional criteria and insurance authorization.  Follow Up Recommendations  Home health PT     Assistance Recommended at Discharge Intermittent Supervision/Assistance  Equipment Recommendations  Rolling walker (2 wheels)    Recommendations for Other Services       Precautions / Restrictions Precautions Precautions: Fall;Other (comment) Precaution Comments: trach Restrictions Weight Bearing Restrictions: No     Mobility  Bed Mobility Overal bed mobility: Needs Assistance Bed Mobility: Supine to Sit;Sit to Supine     Supine to sit: Supervision Sit to supine: Supervision   General bed mobility comments: for lines    Transfers Overall transfer level: Needs assistance Equipment used: Rolling walker (2 wheels);None Transfers: Sit to/from  Stand;Bed to chair/wheelchair/BSC Sit to Stand: Supervision     Step pivot transfers: Min guard (no device)     General transfer comment: for safety, line mgmt    Ambulation/Gait Ambulation/Gait assistance: Min guard Gait Distance (Feet): 200 Feet Assistive device: Rolling walker (2 wheels) Gait Pattern/deviations: Step-through pattern;Decreased stride length;Wide base of support Gait velocity: Decreased     General Gait Details: no imbalance noted   Stairs             Wheelchair Mobility    Modified Rankin (Stroke Patients Only) Modified Rankin (Stroke Patients Only) Pre-Morbid Rankin Score: No symptoms Modified Rankin: Moderately severe disability     Balance Overall balance assessment: Needs assistance Sitting-balance support: Feet supported Sitting balance-[name]: Good Sitting balance - Comments: EOB without physical assist   Standing balance support: No upper extremity supported;During functional activity Standing balance-[name]: Fair Standing balance comment: dynamcially min guard                            Cognition Arousal/Alertness: Awake/alert Behavior During Therapy: WFL for tasks assessed/performed Overall Cognitive Status: Within Functional Limits for tasks assessed                                 General Comments: labile during session, reports down about situtation        Exercises General Exercises - Upper Extremity Shoulder Flexion: Strengthening;Both;10 reps;Seated Theraband Level (Shoulder Flexion): Level 2 (Red) Shoulder Horizontal ABduction: Strengthening;Both;Seated;10 reps Theraband Level (Shoulder Horizontal Abduction): Level 2 (Red) Elbow Extension: Strengthening;10 reps;Both;Seated;Theraband Theraband Level (Elbow Extension): Level 2 (Red) General Exercises - Lower  Extremity Long Arc Quad: Strengthening;10 reps (theraband) Hip Flexion/Marching: 10 reps;Both;Seated    General Comments  General comments (skin integrity, edema, etc.): on room air during ambulation with sats 88-91%      Pertinent Vitals/Pain Pain Assessment: No/denies pain Faces Pain Scale: No hurt    Home Living                          Prior Function            PT Goals (current goals can now be found in the care plan section) Acute Rehab PT Goals Patient Stated Goal: to breathe better Time For Goal Achievement: 11/08/21 Potential to Achieve Goals: Good Progress towards PT goals: Progressing toward goals    Frequency    Min 3X/week      PT Plan Discharge plan needs to be updated    Co-evaluation              AM-PAC PT "6 Clicks" Mobility   Outcome Measure  Help needed turning from your back to your side while in a flat bed without using bedrails?: None Help needed moving from lying on your back to sitting on the side of a flat bed without using bedrails?: A Little Help needed moving to and from a bed to a chair (including a wheelchair)?: A Little Help needed standing up from a chair using your arms (e.g., wheelchair or bedside chair)?: A Little Help needed to walk in hospital room?: A Little Help needed climbing 3-5 steps with a railing? : A Lot 6 Click Score: 18    End of Session   Activity Tolerance: Patient tolerated treatment well Patient left: with call bell/phone within reach;in bed Nurse Communication: Mobility status PT Visit Diagnosis: Other abnormalities of gait and mobility (R26.89);Difficulty in walking, not elsewhere classified (R26.2);Muscle weakness (generalized) (M62.81)     Time: 9371-6967 PT Time Calculation (min) (ACUTE ONLY): 42 min  Charges:  $Gait Training: 23-37 mins $Therapeutic Exercise: 8-22 mins                      [name], PT Acute Rehabilitation Services  Pager [phone] Office [phone]    [name] 11/01/2021, 2:51 PM

## 2021-11-01 NOTE — TOC Progression Note (Addendum)
Transition of Care [facility]) - Progression Note    Patient Details  Name: Maureen Jones MRN: [id number] Date of Birth: 1966/11/01  [facility]) CM/SW Contact  Cyndi Bender, RN Phone Number: 11/01/2021, 1:50 PM  Clinical Narrative:   Spoke with patient regarding transition needs and possible discharging home with friend. Spoke to [name] and She is coming to hospital to learn trach care with patient. For possible discharge home.   Choice offered with list provided Per CMS guidelines from medicare.gov website with star ratings. Patient deferred to [facility] to find agency.   Attempting to find [facility] agency that can accept new trach.  Aimee Halferty with [facility] medicare sent [facility] agencies to reach out to. I have reached out to all of them and there isn't an agency that can take a new trach patient.   Spoke to [place] with [facility] and referral not accepted due to staffing.   Spoke to [employer] with [facility]  and referral not accepted due to staffing.   Spoke to representative at [facility]  and referral not accepted due to staffing.   Spoke to Amy with [facility]  and referral not accepted due to staffing.   Spoke to [place] with [facility] and referral not accepted due to staffing.   Spoke to [place] with [facility] and referral not accepted due to staffing.   Spoke to [place] with [facility] and referral not accepted due to staffing.   Spoke to [employer] with [facility] and  and referral not accepted due to staffing.   Spoke to [place] with [facility] and referral not accepted due to staffing.   Spoke to And[place] with [facility] and referral not accepted due to staffing.    Orders for DME,  rolling walker and 3&1. Patient agreeable to use in house provider.  Notified [place] with [facility].  Erenest Blank, is requesting hospital bed if discharge home. Dr. Tyrell Antonio notified   No PCP. Will make PCP appointment for hospital follow up  [facility] will continue to follow    Expected Discharge Plan: [place] Barriers to Discharge: Continued Medical Work up  Expected Discharge Plan and Services Expected Discharge Plan: [place]   Discharge Planning Services: CM Consult Post Acute Care Choice: [place] arrangements for the past 2 months: Mobile Home                 DME Arranged: 3-N-1, Walker rolling DME Agency: [facility] Date DME Agency Contacted: 11/01/21 Time DME Agency Contacted: 1350 Representative spoke with at DME Agency: [place] Arranged: PT, OT, Speech Therapy [place] Agency: [place] Date [place]: 09/29/21 Time [place]: 1620 Representative spoke with at [place]: [place] ([address]) Interventions    Readmission Risk Interventions No flowsheet data found.

## 2021-11-02 DIAGNOSIS — G9340 Encephalopathy, unspecified: Secondary | ICD-10-CM | POA: Diagnosis not present

## 2021-11-02 DIAGNOSIS — Z930 Tracheostomy status: Secondary | ICD-10-CM

## 2021-11-02 LAB — GLUCOSE, CAPILLARY
Glucose-Capillary: 154 mg/dL — ABNORMAL HIGH (ref 70–99)
Glucose-Capillary: 197 mg/dL — ABNORMAL HIGH (ref 70–99)
Glucose-Capillary: 149 mg/dL — ABNORMAL HIGH (ref 70–99)
Glucose-Capillary: 160 mg/dL — ABNORMAL HIGH (ref 70–99)

## 2021-11-02 MED ORDER — ADULT MULTIVITAMIN W/MINERALS CH
1.0000 | ORAL_TABLET | Freq: Every day | ORAL | Status: DC
  Administered 2021-11-02 – 2021-11-15 (×14): 1 via ORAL
  Filled 2021-11-02 (×14): qty 1

## 2021-11-02 MED ORDER — GLUCERNA SHAKE PO LIQD
237.0000 mL | Freq: Two times a day (BID) | ORAL | Status: DC
  Administered 2021-11-03 – 2021-11-09 (×6): 237 mL via ORAL

## 2021-11-02 NOTE — Progress Notes (Signed)
Chaplain received consult for prayer. Pt was asleep. Chaplain softly knocked on pt's door and entered the room, but she did not arouse. Will try again.  Please page as further needs arise.  Donald Prose. Elyn Peers, M.Div. [facility] Chaplain Pager [phone] Office [phone]

## 2021-11-02 NOTE — Plan of Care (Signed)
  Problem: Health Behavior/Discharge Planning: Goal: Ability to manage health-related needs will improve Outcome: Progressing   Problem: Pain Managment: Goal: General experience of comfort will improve Outcome: Progressing   Problem: Nutrition: Goal: Risk of aspiration will decrease Outcome: Progressing Goal: Dietary intake will improve Outcome: Progressing

## 2021-11-02 NOTE — Progress Notes (Signed)
Physical Therapy Treatment Patient Details Name: Maureen Jones MRN: [id number] DOB: 1966-10-28 Today's Date: 11/02/2021   History of Present Illness 55 y.o. female admitted 09/20/21 with AMS. 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 CXR with low lung volumes, L>R interstitial opacities. Acute metabolic encephalopathy in setting of HTN urgency, possible PRES. ETT (10/10-10/12, 10/12-10/14). Course complicated by confusion/agitation overnight 10/17. 10/25 trach and bronch with return to vent.  11/2 pt passed out while returning from the bathroom pt with respiratory distress from disloged trach leading to PEA arrest with CPR x 10 min. ETT via stoma.  Trach revised 11/2.  PMHx:HTN, DM.    PT Comments    Patient able to progress to ambulating wihtout a device x 200 ft. She did however, require oxygen (at 28% via trach collar) due to decreased sats on RA to 83% with HR 117. Once O2 added, sats 94% and HR 106. Patient is currently mobilizing well enough to discharge home with close supervision. Will continue to work towards independence.     Recommendations for follow up therapy are one component of a multi-disciplinary discharge planning process, led by the attending physician.  Recommendations may be updated based on patient status, additional functional criteria and insurance authorization.  Follow Up Recommendations  Home health PT     Assistance Recommended at Discharge Intermittent Supervision/Assistance  Equipment Recommendations  Rolling walker (2 wheels)    Recommendations for Other Services Rehab consult     Precautions / Restrictions Precautions Precautions: Fall;Other (comment) Precaution Comments: trach     Mobility  Bed Mobility Overal bed mobility: Needs Assistance Bed Mobility: Supine to Sit;Sit to Supine     Supine to sit: Modified independent (Device/Increase time)     General bed mobility comments: good awareness of lines    Transfers Overall transfer  level: Needs assistance Equipment used: None Transfers: Sit to/from Stand;Bed to chair/wheelchair/BSC Sit to Stand: Supervision           General transfer comment: for safety, line mgmt    Ambulation/Gait Ambulation/Gait assistance: Min guard Gait Distance (Feet): 200 Feet Assistive device: None Gait Pattern/deviations: Step-through pattern;Decreased stride length;Wide base of support Gait velocity: Decreased     General Gait Details: no imbalance noted; did require 28% oxygen via trach collar due to desat to 83% on room air   Stairs             Wheelchair Mobility    Modified Rankin (Stroke Patients Only) Modified Rankin (Stroke Patients Only) Pre-Morbid Rankin Score: No symptoms Modified Rankin: Moderately severe disability     Balance Overall balance assessment: Needs assistance Sitting-balance support: Feet supported Sitting balance-[name]: Good     Standing balance support: No upper extremity supported;During functional activity Standing balance-[name]: Fair Standing balance comment: dynamcially min guard                            Cognition Arousal/Alertness: Awake/alert Behavior During Therapy: WFL for tasks assessed/performed Overall Cognitive Status: Within Functional Limits for tasks assessed                                          Exercises      General Comments        Pertinent Vitals/Pain Pain Assessment: No/denies pain Faces Pain Scale: No hurt    Home Living  Prior Function            PT Goals (current goals can now be found in the care plan section) Acute Rehab PT Goals Patient Stated Goal: to breathe better Time For Goal Achievement: 11/08/21 Potential to Achieve Goals: Good Progress towards PT goals: Progressing toward goals    Frequency    Min 3X/week      PT Plan Current plan remains appropriate    Co-evaluation               AM-PAC PT "6 Clicks" Mobility   Outcome Measure  Help needed turning from your back to your side while in a flat bed without using bedrails?: None Help needed moving from lying on your back to sitting on the side of a flat bed without using bedrails?: None Help needed moving to and from a bed to a chair (including a wheelchair)?: A Little Help needed standing up from a chair using your arms (e.g., wheelchair or bedside chair)?: A Little Help needed to walk in hospital room?: A Little Help needed climbing 3-5 steps with a railing? : A Little 6 Click Score: 20    End of Session Equipment Utilized During Treatment: Oxygen;Gait belt Activity Tolerance: Patient tolerated treatment well Patient left: with call bell/phone within reach;in chair;with chair alarm set Nurse Communication: Mobility status PT Visit Diagnosis: Other abnormalities of gait and mobility (R26.89);Difficulty in walking, not elsewhere classified (R26.2);Muscle weakness (generalized) (M62.81)     Time: 3299-2426 PT Time Calculation (min) (ACUTE ONLY): 22 min  Charges:  $Gait Training: 8-22 mins                      [name], PT Acute Rehabilitation Services  Pager [phone] Office [phone]    [name] 11/02/2021, 10:06 AM

## 2021-11-02 NOTE — Progress Notes (Signed)
NAMECaelan Jones, MRN:  [id number], DOB:  November 12, 1966, LOS: 30 ADMISSION DATE:  09/20/2021 CONSULTATION DATE: 09/20/2021 REFERRING MD:  Dr. Laverta Baltimore CHIEF COMPLAINT:  AMS   History of Present Illness:  55 year old woman who presented to [facility] on 10/10 with AMS secondary to hypertensive emergency with hypoxic respiratory failure related to VCD/post intubation subglottic and glottic edema/vocal hoarseness, +/- asthmatic exacerbation.  Ultimately required tracheostomy placement 10/25.   Transferred back to ICU on 11/2 after trach became dislodged on floor, resulting in PEA arrest with CPR x 10 min and 2mg  epi. ROSC obtained. Trach revised to 6.0 XLT Distal 11/2, then to 6.0 XLT Proximal 11/5.  Remains stable from a tracheostomy standpoint.  Pertinent Medical History:  HTN, T2DM   Significant Hospital Events: Including procedures, antibiotic start and stop dates in addition to other pertinent events   10/10 Admit with acute encephalopathy, hypertensive 10/12 extubated, increased WOB, re-intubated 10/14 extubated , lingering stridor, Received racemic epi x2 over the last 24 hours + another dose of Decadron, Mild agitation requiring restart of Precedex 10/15 swallow evaluation >> dysphagia 3 diet 10/20 decompensated after switch to PO steroids 10/21 better after CAT and back on IV steroids 10/22 agitated back on  BIPAP 10/25 stridor again. Marked work of breathing. Intubated, airway noises, stridor and wheezing completely resolved post intubation. Spoke to ENT who will follow after trach. Trach placed by Dr Vaughan Browner. Seroquel increased to 100 qhs, changes solumedrol to prednisone to begin taper, rested on propofol over night. Tubefeeds started. RUE PICC placed  10/26 prop weaning off attempting ATC  11/2 PEA arrest in setting of dislodged tracheostomy. questionable syncope.  Total 10 minutes CPR. Transferred to intensive care unit intubated via trach stoma for redo trach 11/2/202.  Redo  tracheostomy. 6 XLT. 11/4 Concern overnight for STEMI, hep gtt started, cards consulted; this was artifact 11/5 changed out distal XLT for proximal due to positioning within trachea 11/8 tolerated trach collar for about 6 hours yesterday 11/7, placed back on vent  11/10 did not require vent overnight  11/14 Stable from tracheostomy standpoint, remains on ATC 8L FiO2 35%. Ongoing TOC discussion re: placement, not a good candidate for CIR due to unavailable 24H care post-CIR discharge. 11/02/2021 not ready for decannulation  Interim History / Subjective:  No acute distress at rest  Objective:  Blood pressure (!) 190/92, pulse 98, temperature 98.5 F (36.9 C), temperature source Oral, resp. rate 20, height 5\' 6"  (1.676 m), weight 101.6 kg, SpO2 99 %.    FiO2 (%):  [28 %] 28 %   Intake/Output Summary (Last 24 hours) at 11/02/2021 7026 Last data filed at 11/01/2021 2130 Gross per 24 hour  Intake 180 ml  Output --  Net 180 ml   Filed Weights   10/31/21 0435 11/01/21 0400 11/02/21 0500  Weight: 102.7 kg 102.1 kg 101.6 kg   Physical Examination: General: Awake alert no acute distress  HEENT: MM pink/moist #5 uncuffed trach in place Neuro: Grossly intact without focal defect CV: Heart sounds are regular  PULM: Rhonchi bilaterally continues trach collar require suctioning via trach 2-3 times a day with thick secretion Extremities: warm/dry,  edema  Skin: no rashes or lesions   Resolved Hospital Problem List:   RML/RLL PNA (treated) Hypertensive emergency   Assessment & Plan:   Acute hypoxemic respiratory failure history of vocal cord dysfunction Subglottic and glottic edema Status post tracheostomy 10/25. Tracheostomy dislodgment 11/2 led to PEA arrest; required CPR x 10 minutes. Changed  to 6.0 Proximal XLT 11/5.  Changed to uncuffed proximal XLT Shiley 10/25/2021 with notation that she should not be decannulated.    11/02/2021 continues to have thick secretions requiring  tracheal suctioning 3 times a day Not a candidate for decannulation at this time Continue mobilization and pulmonary hygiene Pulmonary following once a week for trach Note trach was done by Dr. Constance Holster of ENT    Best Practice: (right click and "Reselect all SmartList Selections" daily)   Per primary

## 2021-11-02 NOTE — Progress Notes (Signed)
PROGRESS NOTE    Maureen Jones  ZJI:[id number] DOB: Apr 18, 1966 DOA: 09/20/2021 PCP: Pcp, No   Brief Narrative: 55 year old with past medical history significant for hypertension and diabetes who presented to [facility] on 10/10 with report of acute altered mental status secondary to hypertensive emergency with hypoxic respiratory failure related to VCD/post intubation, supraglottic and glottic edema/vocal hoarseness plus or minus asthmatic exacerbation.  Ultimately required trach placement.  Transferred back to ICU on 11/2 after trach became dislodged again on the floor resulting in PEA arrest with 10 minutes CPR and 2 mg of epi.  ROSC obtained.  Trach revised to 6.0 XLT. Insurance denied CIR. Patient will need SNF or LTAC>   Significant events: 10/10 Admit with acute encephalopathy, hypertensive 10/12 extubated, increased WOB, re-intubated 10/14 extubated , lingering stridor, Received racemic epi x2 over the last 24 hours + another dose of Decadron, Mild agitation requiring restart of Precedex 10/15 swallow evaluation >> dysphagia 3 diet 10/20 decompensated after switch to PO steroids 10/21 better after CAT and back on IV steroids 10/22 agitated back on  BIPAP 10/25 stridor again. Marked work of breathing. Intubated, airway noises, stridor and wheezing completely resolved post intubation. Spoke to ENT who will follow after trach. Trach placed by Dr Vaughan Browner. Seroquel increased to 100 qhs, changes solumedrol to prednisone to begin taper, rested on propofol over night. Tubefeeds started. RUE PICC placed  10/26 prop weaning off attempting ATC  11/2 PEA arrest in setting of dislodged tracheostomy. questionable syncope.  Total 10 minutes CPR. Transferred to intensive care unit intubated via trach stoma for redo trach 11/2/202.  Redo tracheostomy. 6 XLT. 11/4 Concern overnight for STEMI, hep gtt started, cards consulted; this was artifact 11/5 changed out distal XLT for proximal due to positioning  within trachea 11/8 tolerated trach collar for about 6 hours 11/7, placed back on vent  11/14: Tracheostomy tube was changed from size 6 cuffed to size 5 uncuffed proximal XLT Shiley       Assessment & Plan:   Active Problems:   Encephalopathy acute   Endotracheal tube present   Acute respiratory failure (HCC)   Subglottic edema   Vocal cord dysfunction   Tracheostomy tube present (HCC)  1-Acute hypoxic respiratory failure in the setting of vocal cord dysfunction/supraglottic and glottic edema/asthma exacerbation: -Patient is status post tracheostomy. -She had dislodgment of her trach on 11/2 leading to PEA arrest.  Required CPR for 10 minutes following which she had ROSC.  -Respiratory status stable,  has not required ventilator for several days.  On trach collar -Trach  was changed to size 5 on cough.  Tolerated PMV -Continue with Guaifenesin.  -She is breathing ok. Down on 5 L oxygen.  -Event last night with inner cannula crimped. This was exchange. She is breathing well. Nursing has been doing trach care.  -Insurance decline CIR. Plan for Home with Home health.  -CCM Following on patient for trach. See Dr Lamonte Sakai in regards to disposition.  -schedule duo neb.   Hypertension: Continue hydralazine and nitrates and carvedilol Continue with Norvasc.  Diabetes type 2 uncontrolled hyperglycemia: Hemoglobin A1c 12 On  levemir 16 units.  Continue with meals coverage 4 units novolog.   Diabetes gastroparesis: KUB negative for obstruction. Lipase and liver function test normal. Improved on Reglan. PPI.  CKD  stage IIIb: Prior creatinine 1.7 last year in Care Everywhere. Renal function stable Cr 1.9---2 Remain stable.   Health Care associated pneumonia: Completed 7 days of antibiotics  Agitation; Seems to have  resolved. Continue to wean down clonazepam and and Seroquel gradually Last modification to Seroquel 11/10.  Normocytic  anemia: Likely anemia of critical illness.   Monitor hemoglobin Started  oral iron trial.   Hypomagnesemia and hypophosphatemia: Replaced.   Dysphagia/nutrition: 2 tracheostomy No longer on tube feedings. On regular diet     Nutrition Problem: Inadequate oral intake Etiology: inability to eat    Signs/Symptoms: NPO status    Interventions: Glucerna shake, MVI  Estimated body mass index is 36.15 kg/m as calculated from the following:   Height as of this encounter: 5\' 6"  (1.676 m).   Weight as of this encounter: 101.6 kg.   DVT prophylaxis: Heparin Code Status: Partial code Family Communication: Care discussed with patient Disposition Plan:  Status is: Inpatient  Remains inpatient appropriate because: Patient admitted with altered mental status in the setting of hypertensive emergency, subsequently developed respiratory failure and required trach.   Plan to refer for SNF for rehab. Last resource home with [facility] and family care.         Consultants:  CCM ENT  Procedures:    Antimicrobials:    Subjective No new complaints. Breathing ok. Still coughing.    Objective: Vitals:   11/02/21 0824 11/02/21 0836 11/02/21 1202 11/02/21 1622  BP:  (!) 190/92 (!) 145/73 (!) 153/81  Pulse:  98 84 84  Resp:  20 17 14   Temp:   98.7 F (37.1 C) 98.8 F (37.1 C)  TempSrc:   Oral Oral  SpO2: 99%  96% 92%  Weight:      Height:        Intake/Output Summary (Last 24 hours) at 11/02/2021 1654 Last data filed at 11/02/2021 1300 Gross per 24 hour  Intake 180 ml  Output 200 ml  Net -20 ml    Filed Weights   10/31/21 0435 11/01/21 0400 11/02/21 0500  Weight: 102.7 kg 102.1 kg 101.6 kg    Examination:  General exam: NAD, Trach in place.  Respiratory system: BL ronchus Cardiovascular system: S 1, S 2 RRR Gastrointestinal system: BS present, soft, nt Central nervous system: Alert, follows command Extremities: No edema Skin: No rashes  Data Reviewed: I have personally reviewed following labs and  imaging studies  CBC: Recent Labs  Lab 10/27/21 0114 10/28/21 0059 10/30/21 0015 11/01/21 0030  WBC 10.4 8.9 8.5 9.0  HGB 8.1* 8.0* 8.0* 8.1*  HCT 27.0* 26.9* 27.0* 27.3*  MCV 95.7 96.4 97.5 95.8  PLT 321 301 286 627    Basic Metabolic Panel: Recent Labs  Lab 10/27/21 0114 10/28/21 0059 10/30/21 0015 11/01/21 0030  NA 137 135 135 139  K 4.1 4.4 4.4 4.3  CL 101 99 100 101  CO2 28 26 29 30   GLUCOSE 228* 264* 280* 251*  BUN 33* 33* 29* 26*  CREATININE 2.03* 1.92* 1.81* 1.81*  CALCIUM 9.6 9.5 9.8 9.9  MG 1.6* 2.0  --   --   PHOS  --  2.8  --   --     GFR: Estimated Creatinine Clearance: 42.7 mL/min (A) (by C-G formula based on SCr of 1.81 mg/dL (H)). Liver Function Tests: No results for input(s): AST, ALT, ALKPHOS, BILITOT, PROT, ALBUMIN in the last 168 hours.  No results for input(s): LIPASE, AMYLASE in the last 168 hours. No results for input(s): AMMONIA in the last 168 hours. Coagulation Profile: No results for input(s): INR, PROTIME in the last 168 hours. Cardiac Enzymes: No results for input(s): CKTOTAL, CKMB, CKMBINDEX, TROPONINI in the last  168 hours. BNP (last 3 results) No results for input(s): PROBNP in the last 8760 hours. HbA1C: No results for input(s): HGBA1C in the last 72 hours. CBG: Recent Labs  Lab 11/01/21 1615 11/01/21 2034 11/02/21 0812 11/02/21 1203 11/02/21 1625  GLUCAP 191* 151* 154* 197* 149*    Lipid Profile: No results for input(s): CHOL, HDL, LDLCALC, TRIG, CHOLHDL, LDLDIRECT in the last 72 hours. Thyroid Function Tests: No results for input(s): TSH, T4TOTAL, FREET4, T3FREE, THYROIDAB in the last 72 hours. Anemia Panel: No results for input(s): VITAMINB12, FOLATE, FERRITIN, TIBC, IRON, RETICCTPCT in the last 72 hours. Sepsis Labs: No results for input(s): PROCALCITON, LATICACIDVEN in the last 168 hours.  No results found for this or any previous visit (from the past 240 hour(s)).       Radiology Studies: No results  found.      Scheduled Meds:  amLODipine  10 mg Oral Daily   arformoterol  15 mcg Nebulization BID   aspirin  81 mg Oral Daily   budesonide (PULMICORT) nebulizer solution  0.25 mg Nebulization BID   carvedilol  12.5 mg Oral BID   chlorhexidine  15 mL Mouth Rinse BID   Chlorhexidine Gluconate Cloth  6 each Topical Daily   clonazePAM  0.5 mg Oral BID   DULoxetine  60 mg Oral Daily   [START ON 11/03/2021] feeding supplement (GLUCERNA SHAKE)  237 mL Oral BID BM   ferrous sulfate  325 mg Oral Q breakfast   fluticasone  2 spray Each Nare Daily   guaiFENesin  1,200 mg Oral BID   heparin injection (subcutaneous)  5,000 Units Subcutaneous Q8H   hydrALAZINE  100 mg Oral Q8H   insulin aspart  0-15 Units Subcutaneous TID WC   insulin aspart  0-5 Units Subcutaneous QHS   insulin aspart  4 Units Subcutaneous TID WC   insulin detemir  16 Units Subcutaneous BID   ipratropium-albuterol  3 mL Nebulization TID   isosorbide dinitrate  10 mg Oral BID   lidocaine  2 patch Transdermal Q24H   loratadine  10 mg Oral Daily   mouth rinse  15 mL Mouth Rinse q12n4p   metoCLOPramide  5 mg Oral TID AC   montelukast  10 mg Oral QHS   multivitamin with minerals  1 tablet Oral Daily   pantoprazole  40 mg Oral BID   QUEtiapine  50 mg Oral BID   sodium chloride flush  10-40 mL Intracatheter Q12H   Continuous Infusions:  sodium chloride Stopped (10/21/21 0722)     LOS: 43 days    Time spent: 35 minutes    Mennie Spiller A Havilah Topor, MD [facility]   If 7PM-7AM, please contact night-coverage [email or web address]  11/02/2021, 4:54 PM

## 2021-11-02 NOTE — Progress Notes (Signed)
Chaplain attempted to complete consult for prayer with Maureen Jones. Pt is still asleep and does not rouse with gentle distraction. Chaplain notified pt's RN that she may page spiritual care if pt would like a visit for prayer when she is awake and on-call chaplain will follow up if acuity allows.  Please page as further needs arise.  Donald Prose. Elyn Peers, M.Div. [facility] Chaplain Pager [phone] Office [phone]     11/02/21 1628  Clinical Encounter Type  Visited With Health care provider;Patient not available  Visit Type Follow-up

## 2021-11-02 NOTE — Progress Notes (Signed)
Nutrition Follow-up  DOCUMENTATION CODES:   Obesity unspecified  INTERVENTION:   Multivitamin w/ minerals daily Glucerna Shake po BID, each supplement provides 220 kcal and 10 grams of protein Encourage good PO intake  Discontinue Vital Cuisine Shake  NUTRITION DIAGNOSIS:   Inadequate oral intake related to inability to eat as evidenced by NPO status. - Progressing, pt now on diet  GOAL:   Patient will meet greater than or equal to 90% of their needs - Progressing  MONITOR:   PO intake, Supplement acceptance, Labs  REASON FOR ASSESSMENT:   Ventilator, Consult Enteral/tube feeding initiation and management  ASSESSMENT:   Pt with PMH of DM, HTN, and smoking admitted with acute encephalopathy.  10/10 - admit, intubated 10/12 - extubated and re-intubated 10/14 - extubated  10/15 - dysphagia 3 diet s/p swallow eval 10/22 - returned to BiPAP 10/25 - re-intubated, trach 10/26 - Cortrak placed (gastric tip confirmed via x-ray) 11/02 - Code Blue due to malpositioning of trach, s/p re-do trach 11/03 - NG tube placed (tip in stomach) 11/04 - Cortrak placed (tip in stomach) 11/08 - MBS, diet advanced to carb modified with nectar-thick liquids, Cortrak dislodged d/t N/V and removed 11/16 - diet advanced to Carb Modified w/ thin liquids  Pt reports that her appetite is "so-so", but she is eating well. Reports that she has been only drinking 1 Hormel shake/day. Discussed with pt about changing to thin liquid shake, pt agreed.  Per EMR, pt intake includes: 11/17: Breakfast 100%, Lunch 100% 11/18: Breakfast 50%  Pt with no other concerns or questions at this time.   Medications reviewed and include: Ferrous Sulfate, SSI 0-15 units TID + 0-5 units daily + 4 units TID, Levemir 16 units, Reglan, Protonix Labs reviewed: 24 BG trends 151-197  Diet Order:   Diet Order             Diet Carb Modified Fluid consistency: Thin; Room service appropriate? Yes with Assist  Diet  effective now                   EDUCATION NEEDS:   No education needs have been identified at this time  Skin:  Skin Assessment: Reviewed RN Assessment  Last BM:  10/31/2021  Height:   Ht Readings from Last 1 Encounters:  10/05/21 5\' 6"  (1.676 m)    Weight:   Wt Readings from Last 1 Encounters:  11/02/21 101.6 kg    Ideal Body Weight:  59.1 kg  BMI:  Body mass index is 36.15 kg/m.  Estimated Nutritional Needs:   Kcal:  1900-2100  Protein:  95-105g  Fluid:  >1.9L    Hermina Barters BS, PLDN Clinical Dietitian See AMiON for contact information.

## 2021-11-03 DIAGNOSIS — G9340 Encephalopathy, unspecified: Secondary | ICD-10-CM | POA: Diagnosis not present

## 2021-11-03 DIAGNOSIS — J9601 Acute respiratory failure with hypoxia: Secondary | ICD-10-CM | POA: Diagnosis not present

## 2021-11-03 DIAGNOSIS — J384 Edema of larynx: Secondary | ICD-10-CM | POA: Diagnosis not present

## 2021-11-03 DIAGNOSIS — N1832 Chronic kidney disease, stage 3b: Secondary | ICD-10-CM

## 2021-11-03 DIAGNOSIS — E1165 Type 2 diabetes mellitus with hyperglycemia: Secondary | ICD-10-CM

## 2021-11-03 DIAGNOSIS — Z930 Tracheostomy status: Secondary | ICD-10-CM | POA: Diagnosis not present

## 2021-11-03 LAB — CBC
HCT: 27.6 % — ABNORMAL LOW (ref 36.0–46.0)
Hemoglobin: 8.2 g/dL — ABNORMAL LOW (ref 12.0–15.0)
MCH: 28.6 pg (ref 26.0–34.0)
MCHC: 29.7 g/dL — ABNORMAL LOW (ref 30.0–36.0)
MCV: 96.2 fL (ref 80.0–100.0)
Platelets: 260 10*3/uL (ref 150–400)
RBC: 2.87 MIL/uL — ABNORMAL LOW (ref 3.87–5.11)
RDW: 15.3 % (ref 11.5–15.5)
WBC: 9.6 10*3/uL (ref 4.0–10.5)
nRBC: 0 % (ref 0.0–0.2)

## 2021-11-03 LAB — BASIC METABOLIC PANEL
Anion gap: 7 (ref 5–15)
BUN: 22 mg/dL — ABNORMAL HIGH (ref 6–20)
CO2: 30 mmol/L (ref 22–32)
Calcium: 9.9 mg/dL (ref 8.9–10.3)
Chloride: 102 mmol/L (ref 98–111)
Creatinine, Ser: 1.68 mg/dL — ABNORMAL HIGH (ref 0.44–1.00)
GFR, Estimated: 36 mL/min — ABNORMAL LOW (ref >60)
Glucose, Bld: 147 mg/dL — ABNORMAL HIGH (ref 70–99)
Potassium: 4.2 mmol/L (ref 3.5–5.1)
Sodium: 139 mmol/L (ref 135–145)

## 2021-11-03 LAB — GLUCOSE, CAPILLARY
Glucose-Capillary: 150 mg/dL — ABNORMAL HIGH (ref 70–99)
Glucose-Capillary: 190 mg/dL — ABNORMAL HIGH (ref 70–99)
Glucose-Capillary: 200 mg/dL — ABNORMAL HIGH (ref 70–99)
Glucose-Capillary: 194 mg/dL — ABNORMAL HIGH (ref 70–99)

## 2021-11-03 MED ORDER — INSULIN ASPART 100 UNIT/ML IJ SOLN
5.0000 [IU] | Freq: Three times a day (TID) | INTRAMUSCULAR | Status: DC
Start: 2021-11-03 — End: 2021-11-04
  Administered 2021-11-03 – 2021-11-04 (×2): 5 [IU] via SUBCUTANEOUS

## 2021-11-03 MED ORDER — INSULIN DETEMIR 100 UNIT/ML ~~LOC~~ SOLN
20.0000 [IU] | Freq: Two times a day (BID) | SUBCUTANEOUS | Status: DC
  Administered 2021-11-03 – 2021-11-05 (×4): 20 [IU] via SUBCUTANEOUS
  Filled 2021-11-03 (×5): qty 0.2

## 2021-11-03 MED ORDER — IPRATROPIUM-ALBUTEROL 0.5-2.5 (3) MG/3ML IN SOLN
3.0000 mL | Freq: Two times a day (BID) | RESPIRATORY_TRACT | Status: DC
Start: 2021-11-03 — End: 2021-11-07
  Administered 2021-11-03 – 2021-11-07 (×9): 3 mL via RESPIRATORY_TRACT
  Filled 2021-11-03 (×9): qty 3

## 2021-11-03 NOTE — Progress Notes (Signed)
   11/03/21 1140  Clinical Encounter Type  Visited With Patient  Visit Type Follow-up;Spiritual support  Referral From Nurse  Consult/Referral To Chaplain   Chaplain Jorene Guest responded to the consult request for prayer. The patient appeared to be asleep until I tapped on the door. Ike Bene prayed and advised her that chaplains are available for follow-up support as needed. The patient spoke through trach and said to come back for prayer and ok to wake her. This note was prepared by Jeanine Luz, M.Div..  For questions please contact by phone [phone].

## 2021-11-03 NOTE — Progress Notes (Signed)
PROGRESS NOTE  Maureen Jones QQV:[id number] DOB: 1966/11/27   PCP: Pcp, No  Patient is from: Home  DOA: 09/20/2021 LOS: 36  Chief complaints:  Chief Complaint  Patient presents with   Code Stroke     Brief Narrative / Interim history: 55 year old F with PMH of DM-2, CKD-3B, HTN and asthma presented to [facility]  on 10/10 with AMS, hypertensive emergency and acute hypoxic respiratory failure due to VCD/supraglottic and glottic edema with possible asthma exacerbation.  She was extubated on 10/12 but reintubated due to increased work of breathing.  She was extubated again on 10/14 but had to reintubated on 10/25 due to stridor and marked work of breathing.  Eventually, had tracheostomy.  Patient had PEA arrest after dislodged tracheostomy requiring 10 minutes of CPR and 2 mg of epi before ROSC on 11/2.  She had redo tracheostomy with 6 XLT.  She was started on trach collar on 11/8, and tracheostomy downsized to 5 uncuffed proximal XLT Shiley on 11/14.  PCCM following for trach.   Subjective: Seen and examined earlier this morning.  No major events overnight of this morning except for elevated BP.  She complains of nasal congestion, and soreness in her chest from CPR.  Feels fatigued and sleepy as well.  No other complaints.  Objective: Vitals:   11/03/21 0826 11/03/21 0835 11/03/21 1147 11/03/21 1213  BP: (!) 180/83 (!) 180/83 (!) 180/83 128/70  Pulse: 96 93 82 83  Resp: 18 (!) 23 16 18   Temp: 98.3 F (36.8 C)   98.7 F (37.1 C)  TempSrc: Oral   Oral  SpO2: 90% 96% 90% 90%  Weight:      Height:        Intake/Output Summary (Last 24 hours) at 11/03/2021 1414 Last data filed at 11/03/2021 0444 Gross per 24 hour  Intake 10 ml  Output 850 ml  Net -840 ml   Filed Weights   11/01/21 0400 11/02/21 0500 11/03/21 0602  Weight: 102.1 kg 101.6 kg 101.8 kg    Examination:  GENERAL: No apparent distress.  Nontoxic. HEENT: MMM.  Vision and hearing grossly intact.  NECK: Tracheostomy  in place. RESP: On trach collar.  No IWOB.  Fair aeration bilaterally. CVS:  RRR. Heart sounds normal.  ABD/GI/GU: BS+. Abd soft, NTND.  MSK/EXT:  Moves extremities. No apparent deformity. No edema.  SKIN: no apparent skin lesion or wound NEURO: Awake, alert and oriented appropriately.  No apparent focal neuro deficit. PSYCH: Calm. Normal affect.   Procedures:  See above  Microbiology summarized: 10/10-COVID-19 and influenza PCR nonreactive. 10/10, 10/17 and 11/3-MRSA PCR nonreactive. 10/10-blood cultures NGTD. 10/14, 30/81, 10/28, 11/3 and 11/5-respiratory culture with normal respiratory flora.  Assessment & Plan: Acute respiratory failure with hypoxia due to vocal cord dysfunction/supraglottic and glottic edema and possible asthma exacerbation -S/p multiple intubations and extubations -Trach on 10/25.  Currently on trach collar.  Downsized to #5 uncuffed trach.  Tolerating PMV -Continue Brovana, Pulmicort and as needed DuoNeb. -Tracheostomy care -Pulmonology following.  Uncontrolled DM-2 with hyperglycemia, gastroparesis and other complication: P3I 95%. Recent Labs  Lab 11/02/21 1203 11/02/21 1625 11/02/21 2048 11/03/21 0733 11/03/21 1221  GLUCAP 197* 149* 160* 150* 190*  -Continue SSI-moderate -Increase Levemir from 16 to 20 units twice daily -Increase NovoLog from 4 to 5 units 3 times daily with meals -Continue Reglan -Further adjustment as appropriate -Start atorvastatin  Cardiac PEA arrest-in the setting of dislodged tracheostomy-ROSC after 10 minutes of CPR and 2 mg of epi.  Uncontrolled hypertension:  Fluctuating BP? -Continue with Norvasc, Coreg, hydralazine, Imdur.  CKD-3B/azotemia: Stable. Recent Labs    10/19/21 0319 10/20/21 0411 10/21/21 0506 10/23/21 0145 10/25/21 0415 10/27/21 0114 10/28/21 0059 10/30/21 0015 11/01/21 0030 11/03/21 0057  BUN 41* 36* 34* 23* 31* 33* 33* 29* 26* 22*  CREATININE 1.99* 1.79* 2.00* 1.95* 2.05* 2.03* 1.92* 1.81*  1.81* 1.68*  -Continue monitoring   Health Care associated pneumonia:  -Completed 7 days of antibiotics   Agitation-likely ICU delirium.  Seems to have resolved. -Continue to wean down clonazepam and and Seroquel gradually   Normocytic  anemia: H&H relatively stable.  Anemia panel basically normal. Recent Labs    10/19/21 0319 10/20/21 0411 10/21/21 0506 10/23/21 0145 10/25/21 0415 10/27/21 0114 10/28/21 0059 10/30/21 0015 11/01/21 0030 11/03/21 0057  HGB 7.0* 7.9* 7.7* 8.0* 7.8* 8.1* 8.0* 8.0* 8.1* 8.2*  -Continue monitoring   Hypomagnesemia and hypophosphatemia: Resolved.   Dysphagia/nutrition: Likely from intubation and tracheostomy.  Seems to have resolved. -Continue regular diet  Increased nutrient needs -Continue multivitamins, supplements -Appreciate input by dietitian   DVT prophylaxis:  heparin injection 5,000 Units Start: 10/15/21 1400 SCDs Start: 09/20/21 0831  Code Status: Partial code with NIPPV/intubation/ACLS meds but no CPR or DCCV. Family Communication: Patient and/or RN. Available if any question.  Level of care: Progressive Status is: Inpatient  Remains inpatient appropriate because: Safe disposition with tracheostomy    Consultants:  Pulmonology Cardiology   Sch Meds:  Scheduled Meds:  amLODipine  10 mg Oral Daily   arformoterol  15 mcg Nebulization BID   aspirin  81 mg Oral Daily   budesonide (PULMICORT) nebulizer solution  0.25 mg Nebulization BID   carvedilol  12.5 mg Oral BID   chlorhexidine  15 mL Mouth Rinse BID   Chlorhexidine Gluconate Cloth  6 each Topical Daily   clonazePAM  0.5 mg Oral BID   DULoxetine  60 mg Oral Daily   feeding supplement (GLUCERNA SHAKE)  237 mL Oral BID BM   ferrous sulfate  325 mg Oral Q breakfast   fluticasone  2 spray Each Nare Daily   guaiFENesin  1,200 mg Oral BID   heparin injection (subcutaneous)  5,000 Units Subcutaneous Q8H   hydrALAZINE  100 mg Oral Q8H   insulin aspart  0-15 Units  Subcutaneous TID WC   insulin aspart  0-5 Units Subcutaneous QHS   insulin aspart  4 Units Subcutaneous TID WC   insulin detemir  16 Units Subcutaneous BID   ipratropium-albuterol  3 mL Nebulization BID   isosorbide dinitrate  10 mg Oral BID   lidocaine  2 patch Transdermal Q24H   loratadine  10 mg Oral Daily   mouth rinse  15 mL Mouth Rinse q12n4p   metoCLOPramide  5 mg Oral TID AC   montelukast  10 mg Oral QHS   multivitamin with minerals  1 tablet Oral Daily   pantoprazole  40 mg Oral BID   QUEtiapine  50 mg Oral BID   sodium chloride flush  10-40 mL Intracatheter Q12H   Continuous Infusions:  sodium chloride Stopped (10/21/21 0722)   PRN Meds:.sodium chloride, acetaminophen, acetaminophen, albuterol, clonazePAM, dextrose, docusate sodium, guaiFENesin, hydrALAZINE, nitroGLYCERIN, ondansetron (ZOFRAN) IV, phenol, polyethylene glycol, sodium chloride  Antimicrobials: Anti-infectives (From admission, onward)    Start     Dose/Rate Route Frequency Ordered Stop   10/18/21 0830  ceFEPIme (MAXIPIME) 2 g in sodium chloride 0.9 % 100 mL IVPB  Status:  Discontinued        2  g 200 mL/hr over 30 Minutes Intravenous Every 24 hours 10/17/21 1055 10/17/21 1056   10/17/21 2200  ceFEPIme (MAXIPIME) 2 g in sodium chloride 0.9 % 100 mL IVPB        2 g 200 mL/hr over 30 Minutes Intravenous Every 12 hours 10/17/21 1056 10/18/21 2145   10/14/21 0830  ceFEPIme (MAXIPIME) 2 g in sodium chloride 0.9 % 100 mL IVPB  Status:  Discontinued        2 g 200 mL/hr over 30 Minutes Intravenous Every 24 hours 10/14/21 0721 10/17/21 1055   10/14/21 0215  piperacillin-tazobactam (ZOSYN) IVPB 3.375 g        3.375 g 100 mL/hr over 30 Minutes Intravenous  Once 10/14/21 0126 10/14/21 0302   10/04/21 1100  vancomycin (VANCOREADY) IVPB 1250 mg/250 mL  Status:  Discontinued        1,250 mg 166.7 mL/hr over 90 Minutes Intravenous Every 48 hours 10/02/21 0939 10/03/21 1508   10/02/21 2200  ceFEPIme (MAXIPIME) 2 g in  sodium chloride 0.9 % 100 mL IVPB  Status:  Discontinued        2 g 200 mL/hr over 30 Minutes Intravenous Every 12 hours 10/02/21 0948 10/04/21 1010   10/02/21 1030  vancomycin (VANCOREADY) IVPB 2000 mg/400 mL        2,000 mg 200 mL/hr over 120 Minutes Intravenous  Once 10/02/21 0939 10/02/21 1324   09/27/21 0900  ceFEPIme (MAXIPIME) 2 g in sodium chloride 0.9 % 100 mL IVPB  Status:  Discontinued        2 g 200 mL/hr over 30 Minutes Intravenous Every 12 hours 09/27/21 0800 10/02/21 0948   09/27/21 0900  vancomycin (VANCOREADY) IVPB 1500 mg/300 mL  Status:  Discontinued        1,500 mg 150 mL/hr over 120 Minutes Intravenous Every 48 hours 09/27/21 0800 09/28/21 1058   09/21/21 0800  cefTRIAXone (ROCEPHIN) 2 g in sodium chloride 0.9 % 100 mL IVPB        2 g 200 mL/hr over 30 Minutes Intravenous Every 24 hours 09/21/21 0742 09/25/21 0808   09/20/21 0945  cefTRIAXone (ROCEPHIN) 2 g in sodium chloride 0.9 % 100 mL IVPB  Status:  Discontinued        2 g 200 mL/hr over 30 Minutes Intravenous Every 24 hours 09/20/21 0936 09/21/21 0742        I have personally reviewed the following labs and images: CBC: Recent Labs  Lab 10/28/21 0059 10/30/21 0015 11/01/21 0030 11/03/21 0057  WBC 8.9 8.5 9.0 9.6  HGB 8.0* 8.0* 8.1* 8.2*  HCT 26.9* 27.0* 27.3* 27.6*  MCV 96.4 97.5 95.8 96.2  PLT 301 286 257 260   BMP &GFR Recent Labs  Lab 10/28/21 0059 10/30/21 0015 11/01/21 0030 11/03/21 0057  NA 135 135 139 139  K 4.4 4.4 4.3 4.2  CL 99 100 101 102  CO2 26 29 30 30   GLUCOSE 264* 280* 251* 147*  BUN 33* 29* 26* 22*  CREATININE 1.92* 1.81* 1.81* 1.68*  CALCIUM 9.5 9.8 9.9 9.9  MG 2.0  --   --   --   PHOS 2.8  --   --   --    Estimated Creatinine Clearance: 46.1 mL/min (A) (by C-G formula based on SCr of 1.68 mg/dL (H)). Liver & Pancreas: No results for input(s): AST, ALT, ALKPHOS, BILITOT, PROT, ALBUMIN in the last 168 hours. No results for input(s): LIPASE, AMYLASE in the last 168  hours. No results for  input(s): AMMONIA in the last 168 hours. Diabetic: No results for input(s): HGBA1C in the last 72 hours. Recent Labs  Lab 11/02/21 1203 11/02/21 1625 11/02/21 2048 11/03/21 0733 11/03/21 1221  GLUCAP 197* 149* 160* 150* 190*   Cardiac Enzymes: No results for input(s): CKTOTAL, CKMB, CKMBINDEX, TROPONINI in the last 168 hours. No results for input(s): PROBNP in the last 8760 hours. Coagulation Profile: No results for input(s): INR, PROTIME in the last 168 hours. Thyroid Function Tests: No results for input(s): TSH, T4TOTAL, FREET4, T3FREE, THYROIDAB in the last 72 hours. Lipid Profile: No results for input(s): CHOL, HDL, LDLCALC, TRIG, CHOLHDL, LDLDIRECT in the last 72 hours. Anemia Panel: No results for input(s): VITAMINB12, FOLATE, FERRITIN, TIBC, IRON, RETICCTPCT in the last 72 hours. Urine analysis:    Component Value Date/Time   COLORURINE YELLOW 10/14/2021 1422   APPEARANCEUR CLOUDY (A) 10/14/2021 1422   LABSPEC 1.016 10/14/2021 1422   PHURINE 5.0 10/14/2021 1422   GLUCOSEU 50 (A) 10/14/2021 1422   HGBUR SMALL (A) 10/14/2021 1422   BILIRUBINUR NEGATIVE 10/14/2021 1422   KETONESUR NEGATIVE 10/14/2021 1422   PROTEINUR 100 (A) 10/14/2021 1422   NITRITE NEGATIVE 10/14/2021 1422   LEUKOCYTESUR NEGATIVE 10/14/2021 1422   Sepsis Labs: Invalid input(s): PROCALCITONIN, [place]  Microbiology: No results found for this or any previous visit (from the past 240 hour(s)).  Radiology Studies: No results found.    Malikai Gut T. Cylinder  If 7PM-7AM, please contact night-coverage [email or web address] 11/03/2021, 2:14 PM

## 2021-11-03 NOTE — Progress Notes (Signed)
OT Cancellation Note  Patient Details Name: Maureen Jones MRN: [id number] DOB: 08-22-1966   Cancelled Treatment:    Reason Eval/Treat Not Completed: Fatigue/lethargy limiting ability to participate Patient with increased fatigue and difficulty keeping eyes open when therapist attempting treatment session. Patient stating she did not sleep well last night. Therapy will follow back when patient is more alert to participate.   Corinne Ports E. Palmer, [facility] [place] 11/03/2021, 11:30 AM

## 2021-11-03 NOTE — Progress Notes (Signed)
Speech Language Pathology Treatment: Cognitive-Linquistic  Patient Details Name: Maureen Jones MRN: [id number] DOB: [birth year] Today's Date: 11/03/2021 Time: [id number] SLP Time Calculation (min) (ACUTE ONLY): 15 min  Assessment / Plan / Recommendation Clinical Impression  Pt was seen for PMSV treatment. She was alert and cooperative during the session without c/o pain. Pt reported that she has been attempting to work on breath support while reading individual words from the menu. Pt exhibited difficulty maintaining alertness during the session and she reported fatigue from not sleeping well last night. Pt's response time was slower than during sessions, and she exhibited increased difficulty with tasks compared to during prior sessions. She tolerated the PMSV well with stable vitals and no respiratory difficulty. She was independent with donning and doffing, and the PMSV was removed by the pt at the end of the session in preparation for rest. Pt was able to eliminate the reverse breathing pattern during this session and she endorsed using tactile feedback to reinforce this. Pt demonstrated 33% accuracy with coordination of respiration with phonation with phrases during this session increasing to 100% with verbal prompts. Pt's session was terminated prematurely and additional tasks deferred to allow pt to rest. SLP will continue to follow pt.     HPI HPI: Pt is a 55 y/o female admitted with AMS 10/10. Stroke w/u negative, dx with acute metabolic encephalopathy in the setting of hypertensive emergency. ETT 10/10-10/12; reintubated 10/12 d/t increased WOB, not mobilizing secretions, stridor, received steroids, extubated 10/14 with lingering stridor, which was improving 10/15. ENT scoped pt 10/18: "Edema of the glottis and subglottic larynx following recent intubation and then repeat intubation.  There is no evidence of cord paralysis or any tumor present.  There is currently no stridor.  Allow more time  for resolution of the swelling.  Unfortunately she is at risk for developing subglottic stenosis." SLP followed pt 10/15-10/24. MBS 10/19: trace posterior spillage, mild pharyngeal delay, trace transient penetration; regular texture diet with thin liquids recommended and SLP ultimately signed off with pt demonstrating adequate tolerance. 10/25: Stridor again noted; pt intubated and trach placed.  MBS 10/27: oropharyngeal dysphagia characterized by reduced bolus cohesion, reduced hyolaryngeal elevation, reduced anterior laryngeal movement, a pharyngeal delay, penetration (PAS 3, 5) with thin liquids. A dyspahgia 3 diet with nectar thick liquids was initiated and she was subsequently advanced to regular and thin liquids on 10/30. Pt with respiratory distress from disloged trach on 11/2 leading to PEA arrest with CPR x 10 min. ETT via stoma. Trach revised 11/2; #6XLT distal.  Cortrak placed 11/4. Trach changed from 6XL distal to 6XL proximal 11/5 and then to 5XLT.      SLP Plan  Continue with current plan of care      Recommendations for follow up therapy are one component of a multi-disciplinary discharge planning process, led by the attending physician.  Recommendations may be updated based on patient status, additional functional criteria and insurance authorization.    Recommendations  Diet recommendations: Regular;Thin liquid Liquids provided via: Cup;No straw Medication Administration: Whole meds with puree Supervision: Patient able to self feed Compensations: Slow rate;Small sips/bites      Patient may use Passy-Muir Speech Valve: During all waking hours (remove during sleep) PMSV Supervision: Intermittent         Oral Care Recommendations: Oral care BID Follow Up Recommendations: Acute inpatient rehab (3hours/day) Assistance recommended at discharge: PRN SLP Visit Diagnosis: Dysphagia, pharyngeal phase (R13.13) Plan: Continue with current plan of care  Mahari Strahm I. Hardin Negus,  [place], [place] Office number [phone] Pager [place]  11/03/2021, 10:38 AM

## 2021-11-04 DIAGNOSIS — J384 Edema of larynx: Secondary | ICD-10-CM | POA: Diagnosis not present

## 2021-11-04 DIAGNOSIS — Z930 Tracheostomy status: Secondary | ICD-10-CM | POA: Diagnosis not present

## 2021-11-04 DIAGNOSIS — G9340 Encephalopathy, unspecified: Secondary | ICD-10-CM | POA: Diagnosis not present

## 2021-11-04 DIAGNOSIS — J9601 Acute respiratory failure with hypoxia: Secondary | ICD-10-CM | POA: Diagnosis not present

## 2021-11-04 LAB — RENAL FUNCTION PANEL
Albumin: 2.3 g/dL — ABNORMAL LOW (ref 3.5–5.0)
Anion gap: 9 (ref 5–15)
BUN: 24 mg/dL — ABNORMAL HIGH (ref 6–20)
CO2: 24 mmol/L (ref 22–32)
Calcium: 9.5 mg/dL (ref 8.9–10.3)
Chloride: 104 mmol/L (ref 98–111)
Creatinine, Ser: 1.72 mg/dL — ABNORMAL HIGH (ref 0.44–1.00)
GFR, Estimated: 35 mL/min — ABNORMAL LOW (ref >60)
Glucose, Bld: 165 mg/dL — ABNORMAL HIGH (ref 70–99)
Phosphorus: 3.8 mg/dL (ref 2.5–4.6)
Potassium: 4.4 mmol/L (ref 3.5–5.1)
Sodium: 137 mmol/L (ref 135–145)

## 2021-11-04 LAB — HEMOGLOBIN AND HEMATOCRIT, BLOOD
HCT: 27.2 % — ABNORMAL LOW (ref 36.0–46.0)
Hemoglobin: 8.3 g/dL — ABNORMAL LOW (ref 12.0–15.0)

## 2021-11-04 LAB — GLUCOSE, CAPILLARY
Glucose-Capillary: 177 mg/dL — ABNORMAL HIGH (ref 70–99)
Glucose-Capillary: 172 mg/dL — ABNORMAL HIGH (ref 70–99)
Glucose-Capillary: 175 mg/dL — ABNORMAL HIGH (ref 70–99)
Glucose-Capillary: 215 mg/dL — ABNORMAL HIGH (ref 70–99)
Glucose-Capillary: 240 mg/dL — ABNORMAL HIGH (ref 70–99)

## 2021-11-04 LAB — MAGNESIUM: Magnesium: 1.7 mg/dL (ref 1.7–2.4)

## 2021-11-04 MED ORDER — ISOSORBIDE MONONITRATE ER 30 MG PO TB24
30.0000 mg | ORAL_TABLET | Freq: Every day | ORAL | Status: DC
  Administered 2021-11-04 – 2021-11-15 (×12): 30 mg via ORAL
  Filled 2021-11-04 (×12): qty 1

## 2021-11-04 MED ORDER — ATORVASTATIN CALCIUM 10 MG PO TABS
20.0000 mg | ORAL_TABLET | Freq: Every day | ORAL | Status: DC
  Administered 2021-11-04 – 2021-11-15 (×12): 20 mg via ORAL
  Filled 2021-11-04 (×12): qty 2

## 2021-11-04 MED ORDER — FUROSEMIDE 40 MG PO TABS
40.0000 mg | ORAL_TABLET | Freq: Every day | ORAL | Status: DC
  Administered 2021-11-04 – 2021-11-05 (×2): 40 mg via ORAL
  Filled 2021-11-04 (×2): qty 1

## 2021-11-04 MED ORDER — INSULIN ASPART 100 UNIT/ML IJ SOLN
6.0000 [IU] | Freq: Three times a day (TID) | INTRAMUSCULAR | Status: DC
Start: 2021-11-04 — End: 2021-11-12
  Administered 2021-11-05 – 2021-11-11 (×20): 6 [IU] via SUBCUTANEOUS

## 2021-11-04 NOTE — Progress Notes (Signed)
PROGRESS NOTE  Maureen Jones WEX:[id number] DOB: 02/23/1966   PCP: Pcp, No  Patient is from: Home  DOA: 09/20/2021 LOS: 32  Chief complaints:  Chief Complaint  Patient presents with   Code Stroke     Brief Narrative / Interim history: 55 year old F with PMH of DM-2, CKD-3B, HTN and asthma presented to [facility]  on 10/10 with AMS, hypertensive emergency and acute hypoxic respiratory failure due to VCD/supraglottic and glottic edema with possible asthma exacerbation.  She was extubated on 10/12 but reintubated due to increased work of breathing.  She was extubated again on 10/14 but had to reintubated on 10/25 due to stridor and marked work of breathing.  Eventually, had tracheostomy.  Patient had PEA arrest after dislodged tracheostomy requiring 10 minutes of CPR and 2 mg of epi before ROSC on 11/2.  She had redo tracheostomy with 6 XLT.  She was started on trach collar on 11/8, and tracheostomy downsized to 5 uncuffed proximal XLT Shiley on 11/14.  PCCM following for trach.   Subjective: Seen and examined earlier this morning.  No major events overnight of this morning.  No complaints.  She denies chest pain, shortness of breath, GI or UTI symptoms.  Objective: Vitals:   11/04/21 0810 11/04/21 0825 11/04/21 1109 11/04/21 1208  BP: (!) 155/78 (!) 155/78 (!) 165/80   Pulse: 90 92 82   Resp: 15  15   Temp: 98.3 F (36.8 C)  98.5 F (36.9 C)   TempSrc: Oral     SpO2: 91%  94% 95%  Weight:      Height:        Intake/Output Summary (Last 24 hours) at 11/04/2021 1303 Last data filed at 11/04/2021 1024 Gross per 24 hour  Intake 360 ml  Output 1350 ml  Net -990 ml   Filed Weights   11/02/21 0500 11/03/21 0602 11/04/21 0404  Weight: 101.6 kg 101.8 kg 101.8 kg    Examination:  GENERAL: No apparent distress.  Nontoxic. HEENT: MMM.  Vision and hearing grossly intact.  NECK: Tracheostomy in place. RESP: 95% on trach collar.  No IWOB.  Fair aeration bilaterally. CVS:  RRR. Heart  sounds normal.  ABD/GI/GU: BS+. Abd soft, NTND.  MSK/EXT:  Moves extremities. No apparent deformity. No edema.  SKIN: no apparent skin lesion or wound NEURO: Awake and alert. Oriented appropriately.  No apparent focal neuro deficit. PSYCH: Calm. Normal affect.   Procedures:  See above  Microbiology summarized: 10/10-COVID-19 and influenza PCR nonreactive. 10/10, 10/17 and 11/3-MRSA PCR nonreactive. 10/10-blood cultures NGTD. 10/14, 30/81, 10/28, 11/3 and 11/5-respiratory culture with normal respiratory flora.  Assessment & Plan: Acute respiratory failure with hypoxia due to vocal cord dysfunction/supraglottic and glottic edema and possible asthma exacerbation -S/p multiple intubations and extubations -Trach on 10/25.  Currently on trach collar.  Downsized to #5 uncuffed trach.  Tolerating PMV -Continue Brovana, Pulmicort and as needed DuoNeb. -Tracheostomy care -Pulmonology following.  Uncontrolled DM-2 with hyperglycemia, gastroparesis and other complication: L3Y 10%. Recent Labs  Lab 11/03/21 1221 11/03/21 1707 11/03/21 2104 11/04/21 0812 11/04/21 1133  GLUCAP 190* 200* 194* 177* 172*  -Continue SSI-moderate -Continue Levemir 20 units twice daily -Increase NovoLog from 5 units to 6 units 3 times daily with meals -Continue Reglan -Further adjustment as appropriate -Start atorvastatin  PEA arrest-in the setting of dislodged tracheostomy-ROSC after 10 minutes of CPR and 2 mg of epi. -Optimize electrolytes -Continue telemetry monitoring  Uncontrolled hypertension: Fluctuating BP? -Continue with Norvasc, Coreg, hydralazine -Change Isordil to Imdur -  P.o. Lasix 40 mg daily  CKD-3B/azotemia: Stable. Recent Labs    10/20/21 0411 10/21/21 0506 10/23/21 0145 10/25/21 0415 10/27/21 0114 10/28/21 0059 10/30/21 0015 11/01/21 0030 11/03/21 0057 11/04/21 0139  BUN 36* 34* 23* 31* 33* 33* 29* 26* 22* 24*  CREATININE 1.79* 2.00* 1.95* 2.05* 2.03* 1.92* 1.81* 1.81* 1.68*  1.72*  -Continue monitoring   Health Care associated pneumonia:  -Completed 7 days of antibiotics   Agitation-likely ICU delirium.  Seems to have resolved. -Continue to wean down clonazepam and and Seroquel gradually   Normocytic  anemia: H&H relatively stable.  Anemia panel basically normal. Recent Labs    10/20/21 0411 10/21/21 0506 10/23/21 0145 10/25/21 0415 10/27/21 0114 10/28/21 0059 10/30/21 0015 11/01/21 0030 11/03/21 0057 11/04/21 0139  HGB 7.9* 7.7* 8.0* 7.8* 8.1* 8.0* 8.0* 8.1* 8.2* 8.3*  -Continue monitoring   Hypomagnesemia and hypophosphatemia: Resolved.   Dysphagia/nutrition: Likely from intubation and tracheostomy.  Seems to have resolved. -Continue regular diet  Increased nutrient needs -Continue multivitamins, supplements -Appreciate input by dietitian   DVT prophylaxis:  heparin injection 5,000 Units Start: 10/15/21 1400 SCDs Start: 09/20/21 0831  Code Status: Partial code with NIPPV/intubation/ACLS meds but no CPR or DCCV. Family Communication: Patient and/or RN. Available if any question.  Level of care: Progressive Status is: Inpatient  Remains inpatient appropriate because: Safe disposition with tracheostomy  Final disposition-TBD  Consultants:  Pulmonology Cardiology   Sch Meds:  Scheduled Meds:  amLODipine  10 mg Oral Daily   arformoterol  15 mcg Nebulization BID   aspirin  81 mg Oral Daily   budesonide (PULMICORT) nebulizer solution  0.25 mg Nebulization BID   carvedilol  12.5 mg Oral BID   chlorhexidine  15 mL Mouth Rinse BID   Chlorhexidine Gluconate Cloth  6 each Topical Daily   clonazePAM  0.5 mg Oral BID   DULoxetine  60 mg Oral Daily   feeding supplement (GLUCERNA SHAKE)  237 mL Oral BID BM   ferrous sulfate  325 mg Oral Q breakfast   fluticasone  2 spray Each Nare Daily   guaiFENesin  1,200 mg Oral BID   heparin injection (subcutaneous)  5,000 Units Subcutaneous Q8H   hydrALAZINE  100 mg Oral Q8H   insulin aspart   0-15 Units Subcutaneous TID WC   insulin aspart  0-5 Units Subcutaneous QHS   insulin aspart  5 Units Subcutaneous TID WC   insulin detemir  20 Units Subcutaneous BID   ipratropium-albuterol  3 mL Nebulization BID   isosorbide dinitrate  10 mg Oral BID   lidocaine  2 patch Transdermal Q24H   loratadine  10 mg Oral Daily   mouth rinse  15 mL Mouth Rinse q12n4p   metoCLOPramide  5 mg Oral TID AC   montelukast  10 mg Oral QHS   multivitamin with minerals  1 tablet Oral Daily   pantoprazole  40 mg Oral BID   QUEtiapine  50 mg Oral BID   sodium chloride flush  10-40 mL Intracatheter Q12H   Continuous Infusions:  sodium chloride Stopped (10/21/21 0722)   PRN Meds:.sodium chloride, acetaminophen, acetaminophen, albuterol, clonazePAM, dextrose, docusate sodium, guaiFENesin, hydrALAZINE, nitroGLYCERIN, ondansetron (ZOFRAN) IV, phenol, polyethylene glycol, sodium chloride  Antimicrobials: Anti-infectives (From admission, onward)    Start     Dose/Rate Route Frequency Ordered Stop   10/18/21 0830  ceFEPIme (MAXIPIME) 2 g in sodium chloride 0.9 % 100 mL IVPB  Status:  Discontinued        2 g 200  mL/hr over 30 Minutes Intravenous Every 24 hours 10/17/21 1055 10/17/21 1056   10/17/21 2200  ceFEPIme (MAXIPIME) 2 g in sodium chloride 0.9 % 100 mL IVPB        2 g 200 mL/hr over 30 Minutes Intravenous Every 12 hours 10/17/21 1056 10/18/21 2145   10/14/21 0830  ceFEPIme (MAXIPIME) 2 g in sodium chloride 0.9 % 100 mL IVPB  Status:  Discontinued        2 g 200 mL/hr over 30 Minutes Intravenous Every 24 hours 10/14/21 0721 10/17/21 1055   10/14/21 0215  piperacillin-tazobactam (ZOSYN) IVPB 3.375 g        3.375 g 100 mL/hr over 30 Minutes Intravenous  Once 10/14/21 0126 10/14/21 0302   10/04/21 1100  vancomycin (VANCOREADY) IVPB 1250 mg/250 mL  Status:  Discontinued        1,250 mg 166.7 mL/hr over 90 Minutes Intravenous Every 48 hours 10/02/21 0939 10/03/21 1508   10/02/21 2200  ceFEPIme (MAXIPIME)  2 g in sodium chloride 0.9 % 100 mL IVPB  Status:  Discontinued        2 g 200 mL/hr over 30 Minutes Intravenous Every 12 hours 10/02/21 0948 10/04/21 1010   10/02/21 1030  vancomycin (VANCOREADY) IVPB 2000 mg/400 mL        2,000 mg 200 mL/hr over 120 Minutes Intravenous  Once 10/02/21 0939 10/02/21 1324   09/27/21 0900  ceFEPIme (MAXIPIME) 2 g in sodium chloride 0.9 % 100 mL IVPB  Status:  Discontinued        2 g 200 mL/hr over 30 Minutes Intravenous Every 12 hours 09/27/21 0800 10/02/21 0948   09/27/21 0900  vancomycin (VANCOREADY) IVPB 1500 mg/300 mL  Status:  Discontinued        1,500 mg 150 mL/hr over 120 Minutes Intravenous Every 48 hours 09/27/21 0800 09/28/21 1058   09/21/21 0800  cefTRIAXone (ROCEPHIN) 2 g in sodium chloride 0.9 % 100 mL IVPB        2 g 200 mL/hr over 30 Minutes Intravenous Every 24 hours 09/21/21 0742 09/25/21 0808   09/20/21 0945  cefTRIAXone (ROCEPHIN) 2 g in sodium chloride 0.9 % 100 mL IVPB  Status:  Discontinued        2 g 200 mL/hr over 30 Minutes Intravenous Every 24 hours 09/20/21 0936 09/21/21 0742        I have personally reviewed the following labs and images: CBC: Recent Labs  Lab 10/30/21 0015 11/01/21 0030 11/03/21 0057 11/04/21 0139  WBC 8.5 9.0 9.6  --   HGB 8.0* 8.1* 8.2* 8.3*  HCT 27.0* 27.3* 27.6* 27.2*  MCV 97.5 95.8 96.2  --   PLT 286 257 260  --    BMP &GFR Recent Labs  Lab 10/30/21 0015 11/01/21 0030 11/03/21 0057 11/04/21 0139  NA 135 139 139 137  K 4.4 4.3 4.2 4.4  CL 100 101 102 104  CO2 29 30 30 24   GLUCOSE 280* 251* 147* 165*  BUN 29* 26* 22* 24*  CREATININE 1.81* 1.81* 1.68* 1.72*  CALCIUM 9.8 9.9 9.9 9.5  MG  --   --   --  1.7  PHOS  --   --   --  3.8   Estimated Creatinine Clearance: 45 mL/min (A) (by C-G formula based on SCr of 1.72 mg/dL (H)). Liver & Pancreas: Recent Labs  Lab 11/04/21 0139  ALBUMIN 2.3*   No results for input(s): LIPASE, AMYLASE in the last 168 hours. No results for input(s):  AMMONIA in the last 168 hours. Diabetic: No results for input(s): HGBA1C in the last 72 hours. Recent Labs  Lab 11/03/21 1221 11/03/21 1707 11/03/21 2104 11/04/21 0812 11/04/21 1133  GLUCAP 190* 200* 194* 177* 172*   Cardiac Enzymes: No results for input(s): CKTOTAL, CKMB, CKMBINDEX, TROPONINI in the last 168 hours. No results for input(s): PROBNP in the last 8760 hours. Coagulation Profile: No results for input(s): INR, PROTIME in the last 168 hours. Thyroid Function Tests: No results for input(s): TSH, T4TOTAL, FREET4, T3FREE, THYROIDAB in the last 72 hours. Lipid Profile: No results for input(s): CHOL, HDL, LDLCALC, TRIG, CHOLHDL, LDLDIRECT in the last 72 hours. Anemia Panel: No results for input(s): VITAMINB12, FOLATE, FERRITIN, TIBC, IRON, RETICCTPCT in the last 72 hours. Urine analysis:    Component Value Date/Time   COLORURINE YELLOW 10/14/2021 1422   APPEARANCEUR CLOUDY (A) 10/14/2021 1422   LABSPEC 1.016 10/14/2021 1422   PHURINE 5.0 10/14/2021 1422   GLUCOSEU 50 (A) 10/14/2021 1422   HGBUR SMALL (A) 10/14/2021 1422   BILIRUBINUR NEGATIVE 10/14/2021 1422   KETONESUR NEGATIVE 10/14/2021 1422   PROTEINUR 100 (A) 10/14/2021 1422   NITRITE NEGATIVE 10/14/2021 1422   LEUKOCYTESUR NEGATIVE 10/14/2021 1422   Sepsis Labs: Invalid input(s): PROCALCITONIN, [place]  Microbiology: No results found for this or any previous visit (from the past 240 hour(s)).  Radiology Studies: No results found.    Kegan Shepardson T. [place]  If 7PM-7AM, please contact night-coverage [email or web address] 11/04/2021, 1:03 PM

## 2021-11-04 NOTE — Plan of Care (Signed)
  Problem: Health Behavior/Discharge Planning: Goal: Ability to manage health-related needs will improve Outcome: Progressing   Problem: Clinical Measurements: Goal: Ability to maintain clinical measurements within normal limits will improve Outcome: Progressing Goal: Will remain free from infection Outcome: Progressing Goal: Diagnostic test results will improve Outcome: Progressing Goal: Respiratory complications will improve Outcome: Progressing Goal: Cardiovascular complication will be avoided Outcome: Progressing   Problem: Activity: Goal: Risk for activity intolerance will decrease Outcome: Progressing   Problem: Nutrition: Goal: Adequate nutrition will be maintained Outcome: Progressing   Problem: Elimination: Goal: Will not experience complications related to bowel motility Outcome: Progressing Goal: Will not experience complications related to urinary retention Outcome: Progressing   Problem: Pain Managment: Goal: General experience of comfort will improve Outcome: Progressing   Problem: Activity: Goal: Ability to tolerate increased activity will improve Outcome: Progressing   Problem: Coping: Goal: Will verbalize positive feelings about self Outcome: Progressing Goal: Will identify appropriate support needs Outcome: Progressing   Problem: Health Behavior/Discharge Planning: Goal: Ability to manage health-related needs will improve Outcome: Progressing   Problem: Self-Care: Goal: Ability to participate in self-care as condition permits will improve Outcome: Progressing Goal: Verbalization of feelings and concerns over difficulty with self-care will improve Outcome: Progressing   Problem: Nutrition: Goal: Risk of aspiration will decrease Outcome: Progressing Goal: Dietary intake will improve Outcome: Progressing   Problem: Intracerebral Hemorrhage Tissue Perfusion: Goal: Complications of Intracerebral Hemorrhage will be minimized Outcome:  Progressing

## 2021-11-05 DIAGNOSIS — J9601 Acute respiratory failure with hypoxia: Secondary | ICD-10-CM | POA: Diagnosis not present

## 2021-11-05 DIAGNOSIS — G9340 Encephalopathy, unspecified: Secondary | ICD-10-CM | POA: Diagnosis not present

## 2021-11-05 DIAGNOSIS — J384 Edema of larynx: Secondary | ICD-10-CM | POA: Diagnosis not present

## 2021-11-05 DIAGNOSIS — Z930 Tracheostomy status: Secondary | ICD-10-CM | POA: Diagnosis not present

## 2021-11-05 LAB — GLUCOSE, CAPILLARY
Glucose-Capillary: 167 mg/dL — ABNORMAL HIGH (ref 70–99)
Glucose-Capillary: 173 mg/dL — ABNORMAL HIGH (ref 70–99)
Glucose-Capillary: 201 mg/dL — ABNORMAL HIGH (ref 70–99)
Glucose-Capillary: 173 mg/dL — ABNORMAL HIGH (ref 70–99)

## 2021-11-05 MED ORDER — INSULIN DETEMIR 100 UNIT/ML ~~LOC~~ SOLN
25.0000 [IU] | Freq: Two times a day (BID) | SUBCUTANEOUS | Status: DC
  Administered 2021-11-05 – 2021-11-07 (×4): 25 [IU] via SUBCUTANEOUS
  Filled 2021-11-05 (×7): qty 0.25

## 2021-11-05 MED ORDER — ACETAMINOPHEN 325 MG PO TABS
650.0000 mg | ORAL_TABLET | ORAL | Status: DC | PRN
  Administered 2021-11-05: 650 mg via ORAL
  Filled 2021-11-05: qty 2

## 2021-11-05 MED ORDER — CARVEDILOL 25 MG PO TABS
25.0000 mg | ORAL_TABLET | Freq: Two times a day (BID) | ORAL | Status: DC
  Administered 2021-11-05 – 2021-11-15 (×21): 25 mg via ORAL
  Filled 2021-11-05 (×20): qty 1

## 2021-11-05 NOTE — Progress Notes (Signed)
Physical Therapy Treatment Patient Details Name: Maureen Jones MRN: [id number] DOB: 1966-06-25 Today's Date: 11/05/2021   History of Present Illness 55 y.o. female admitted 09/20/21 with AMS. 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 CXR with low lung volumes, L>R interstitial opacities. Acute metabolic encephalopathy in setting of HTN urgency, possible PRES. ETT (10/10-10/12, 10/12-10/14). Course complicated by confusion/agitation overnight 10/17. 10/25 trach and bronch with return to vent.  11/2 pt passed out while returning from the bathroom pt with respiratory distress from disloged trach leading to PEA arrest with CPR x 10 min. ETT via stoma.  Trach revised 11/2.  PMHx:HTN, DM.    PT Comments    Pt progressing towards goals. Mild LOB X3-4 this session and pt reports legs felt wobbly. Min to min guard for transfers and gait. Oxygen sats WFL on 35% oxygen. Current recommendations appropriate. Will continue to follow acutely.     Recommendations for follow up therapy are one component of a multi-disciplinary discharge planning process, led by the attending physician.  Recommendations may be updated based on patient status, additional functional criteria and insurance authorization.  Follow Up Recommendations  Home health PT     Assistance Recommended at Discharge Intermittent Supervision/Assistance  Equipment Recommendations  Rolling walker (2 wheels)    Recommendations for Other Services       Precautions / Restrictions Precautions Precautions: Fall;Other (comment) Precaution Comments: trach Restrictions Weight Bearing Restrictions: No     Mobility  Bed Mobility Overal bed mobility: Modified Independent                  Transfers Overall transfer level: Needs assistance Equipment used: None Transfers: Sit to/from Stand Sit to Stand: Supervision           General transfer comment: Supervision for safety and line management     Ambulation/Gait Ambulation/Gait assistance: Min guard;Min assist Gait Distance (Feet): 200 Feet Assistive device: None Gait Pattern/deviations: Step-through pattern;Decreased stride length;Wide base of support;Drifts right/left Gait velocity: Decreased     General Gait Details: Mildly unsteady. Mild LOB X3-4 and required min A for steadying. Reaching out to rail in hallway for support. Sats >90% on 35% oxygen.   Stairs             Wheelchair Mobility    Modified Rankin (Stroke Patients Only) Modified Rankin (Stroke Patients Only) Pre-Morbid Rankin Score: No symptoms Modified Rankin: Moderately severe disability     Balance Overall balance assessment: Needs assistance Sitting-balance support: Feet supported Sitting balance-[name]: Good     Standing balance support: No upper extremity supported;During functional activity Standing balance-[name]: Fair                              Cognition Arousal/Alertness: Awake/alert Behavior During Therapy: WFL for tasks assessed/performed Overall Cognitive Status: No family/caregiver present to determine baseline cognitive functioning                                          Exercises      General Comments        Pertinent Vitals/Pain Pain Assessment: No/denies pain    Home Living                          Prior Function  PT Goals (current goals can now be found in the care plan section) Acute Rehab PT Goals Patient Stated Goal: to breathe better PT Goal Formulation: With patient Time For Goal Achievement: 11/08/21 Potential to Achieve Goals: Good Progress towards PT goals: Progressing toward goals    Frequency    Min 3X/week      PT Plan Current plan remains appropriate    Co-evaluation              AM-PAC PT "6 Clicks" Mobility   Outcome Measure  Help needed turning from your back to your side while in a flat bed without using  bedrails?: None Help needed moving from lying on your back to sitting on the side of a flat bed without using bedrails?: None Help needed moving to and from a bed to a chair (including a wheelchair)?: A Little Help needed standing up from a chair using your arms (e.g., wheelchair or bedside chair)?: A Little Help needed to walk in hospital room?: A Little Help needed climbing 3-5 steps with a railing? : A Little 6 Click Score: 20    End of Session Equipment Utilized During Treatment: Oxygen;Gait belt Activity Tolerance: Patient tolerated treatment well Patient left: with call bell/phone within reach;in chair;with chair alarm set Nurse Communication: Mobility status PT Visit Diagnosis: Other abnormalities of gait and mobility (R26.89);Difficulty in walking, not elsewhere classified (R26.2);Muscle weakness (generalized) (M62.81)     Time: 1206-1227 PT Time Calculation (min) (ACUTE ONLY): 21 min  Charges:  $Gait Training: 8-22 mins                     Maureen Jones, DPT  Acute Rehabilitation Services  Pager: [phone] Office: [phone]    [name] 11/05/2021, 1:39 PM

## 2021-11-05 NOTE — Progress Notes (Signed)
OT Cancellation Note  Patient Details Name: Simone Rodenbeck MRN: [id number] DOB: 01/01/1966   Cancelled Treatment:    Reason Eval/Treat Not Completed: Patient declined, no reason specified (patient stated she was too tired from doing PT earlier) Lodema Hong, [place]  Pager [phone] Office [place] 11/05/2021, 2:39 PM

## 2021-11-05 NOTE — Progress Notes (Signed)
PROGRESS NOTE  Maureen Jones YWV:[id number] DOB: [birth year]   PCP: Pcp, No  Patient is from: Home  DOA: 09/20/2021 LOS: 26  Chief complaints:  Chief Complaint  Patient presents with   Code Stroke     Brief Narrative / Interim history: 55 year old F with PMH of DM-2, CKD-3B, HTN and asthma presented to [facility]  on 10/10 with AMS, hypertensive emergency and acute hypoxic respiratory failure due to VCD/supraglottic and glottic edema with possible asthma exacerbation.  She was extubated on 10/12 but reintubated due to increased work of breathing.  She was extubated again on 10/14 but had to reintubated on 10/25 due to stridor and marked work of breathing.  Eventually, had tracheostomy.  Patient had PEA arrest after dislodged tracheostomy requiring 10 minutes of CPR and 2 mg of epi before ROSC on 11/2.  She had redo tracheostomy with 6 XLT.  She was started on trach collar on 11/8, and tracheostomy downsized to 5 uncuffed proximal XLT [place] on 11/14.  PCCM following for trach.   Subjective: Seen and examined earlier this morning.  Sitting up bedside chair eating Breakfast.  No major events overnight of this morning.  Had headache earlier this morning that has improved.  No other complaints.  She denies chest pain or shortness of breath.  Objective: Vitals:   11/05/21 0724 11/05/21 0914 11/05/21 1157 11/05/21 1212  BP: 133/73  119/69   Pulse: 87 93 73 75  Resp: 18 20 14 20   Temp: 98.4 F (36.9 C)  97.8 F (36.6 C)   TempSrc: Oral  Oral   SpO2: 94% 98% 94% 96%  Weight:      Height:        Intake/Output Summary (Last 24 hours) at 11/05/2021 1334 Last data filed at 11/05/2021 0900 Gross per 24 hour  Intake 120 ml  Output 2100 ml  Net -1980 ml   Filed Weights   11/04/21 0404 11/05/21 0409 11/05/21 0500  Weight: 101.8 kg 102 kg 101 kg    Examination:   GENERAL: No apparent distress.  Nontoxic. HEENT: MMM.  Vision and hearing grossly intact.  NECK: On trach collar. RESP: 96%  on trach collar.  No IWOB.  Fair aeration bilaterally. CVS:  RRR. Heart sounds normal.  ABD/GI/GU: BS+. Abd soft, NTND.  MSK/EXT:  Moves extremities. No apparent deformity. No edema.  SKIN: no apparent skin lesion or wound NEURO: Awake and alert. Oriented appropriately.  No apparent focal neuro deficit. PSYCH: Calm. Normal affect.   Procedures:  See above  Microbiology summarized: 10/10-COVID-19 and influenza PCR nonreactive. 10/10, 10/17 and 11/3-MRSA PCR nonreactive. 10/10-blood cultures NGTD. 10/14, 30/81, 10/28, 11/3 and 11/5-respiratory culture with normal respiratory flora.  Assessment & Plan: Acute respiratory failure with hypoxia due to vocal cord dysfunction/supraglottic and glottic edema and possible asthma exacerbation -S/p multiple intubations and extubations -Trach on 10/25.  Currently on trach collar.  Downsized to #5 uncuffed trach.  Tolerating PMV -Continue Brovana, Pulmicort and as needed DuoNeb. -Tracheostomy care -Pulmonology following.  Uncontrolled DM-2 with hyperglycemia, gastroparesis and other complication: W5I 62%. Recent Labs  Lab 11/04/21 1337 11/04/21 1701 11/04/21 2032 11/05/21 0731 11/05/21 1202  GLUCAP 175* 215* 240* 167* 173*  -Continue SSI-moderate -Increase Levemir from 20 to 25 units twice daily -Continue Lovenox 6 units 3 times daily with meals -Continue Reglan -Further adjustment as appropriate -Start atorvastatin  PEA arrest-in the setting of dislodged tracheostomy-ROSC after 10 minutes of CPR and 2 mg of epi. -Optimize electrolytes -Continue telemetry monitoring  Uncontrolled hypertension:  BP remains elevated. -Continue with Norvasc, hydralazine -Increase Coreg from 12.5 mg to 25 mg twice daily -Continue Imdur 30 mg daily -Continue p.o. Lasix 40 mg daily  CKD-3B/azotemia: Stable. Recent Labs    10/20/21 0411 10/21/21 0506 10/23/21 0145 10/25/21 0415 10/27/21 0114 10/28/21 0059 10/30/21 0015 11/01/21 0030  11/03/21 0057 11/04/21 0139  BUN 36* 34* 23* 31* 33* 33* 29* 26* 22* 24*  CREATININE 1.79* 2.00* 1.95* 2.05* 2.03* 1.92* 1.81* 1.81* 1.68* 1.72*  -Continue monitoring   Health Care associated pneumonia:  -Completed 7 days of antibiotics   Agitation-likely ICU delirium.  Seems to have resolved. -Continue to wean down clonazepam and and Seroquel gradually   Normocytic  anemia: H&H relatively stable.  Anemia panel basically normal. Recent Labs    10/20/21 0411 10/21/21 0506 10/23/21 0145 10/25/21 0415 10/27/21 0114 10/28/21 0059 10/30/21 0015 11/01/21 0030 11/03/21 0057 11/04/21 0139  HGB 7.9* 7.7* 8.0* 7.8* 8.1* 8.0* 8.0* 8.1* 8.2* 8.3*  -Continue monitoring   Hypomagnesemia and hypophosphatemia: Resolved.   Dysphagia/nutrition: Likely from intubation and tracheostomy.  Seems to have resolved. -Continue regular diet  Increased nutrient needs -Continue multivitamins, supplements -Appreciate input by dietitian   DVT prophylaxis:  heparin injection 5,000 Units Start: 10/15/21 1400 SCDs Start: 09/20/21 0831  Code Status: Partial code with NIPPV/intubation/ACLS meds but no CPR or DCCV. Family Communication: Patient and/or RN. Available if any question.  Level of care: Progressive Status is: Inpatient  Remains inpatient appropriate because: Safe disposition with tracheostomy  Final disposition-TBD  Consultants:  Pulmonology Cardiology   Sch Meds:  Scheduled Meds:  amLODipine  10 mg Oral Daily   arformoterol  15 mcg Nebulization BID   aspirin  81 mg Oral Daily   atorvastatin  20 mg Oral Daily   budesonide (PULMICORT) nebulizer solution  0.25 mg Nebulization BID   carvedilol  25 mg Oral BID   chlorhexidine  15 mL Mouth Rinse BID   Chlorhexidine Gluconate Cloth  6 each Topical Daily   clonazePAM  0.5 mg Oral BID   DULoxetine  60 mg Oral Daily   feeding supplement (GLUCERNA SHAKE)  237 mL Oral BID BM   ferrous sulfate  325 mg Oral Q breakfast   fluticasone  2  spray Each Nare Daily   furosemide  40 mg Oral Daily   guaiFENesin  1,200 mg Oral BID   heparin injection (subcutaneous)  5,000 Units Subcutaneous Q8H   hydrALAZINE  100 mg Oral Q8H   insulin aspart  0-15 Units Subcutaneous TID WC   insulin aspart  0-5 Units Subcutaneous QHS   insulin aspart  6 Units Subcutaneous TID WC   insulin detemir  25 Units Subcutaneous BID   ipratropium-albuterol  3 mL Nebulization BID   isosorbide mononitrate  30 mg Oral Daily   lidocaine  2 patch Transdermal Q24H   loratadine  10 mg Oral Daily   mouth rinse  15 mL Mouth Rinse q12n4p   metoCLOPramide  5 mg Oral TID AC   montelukast  10 mg Oral QHS   multivitamin with minerals  1 tablet Oral Daily   pantoprazole  40 mg Oral BID   QUEtiapine  50 mg Oral BID   sodium chloride flush  10-40 mL Intracatheter Q12H   Continuous Infusions:  sodium chloride Stopped (10/21/21 0722)   PRN Meds:.sodium chloride, acetaminophen, albuterol, dextrose, docusate sodium, guaiFENesin, hydrALAZINE, nitroGLYCERIN, ondansetron (ZOFRAN) IV, phenol, polyethylene glycol, sodium chloride  Antimicrobials: Anti-infectives (From admission, onward)    Start  Dose/Rate Route Frequency Ordered Stop   10/18/21 0830  ceFEPIme (MAXIPIME) 2 g in sodium chloride 0.9 % 100 mL IVPB  Status:  Discontinued        2 g 200 mL/hr over 30 Minutes Intravenous Every 24 hours 10/17/21 1055 10/17/21 1056   10/17/21 2200  ceFEPIme (MAXIPIME) 2 g in sodium chloride 0.9 % 100 mL IVPB        2 g 200 mL/hr over 30 Minutes Intravenous Every 12 hours 10/17/21 1056 10/18/21 2145   10/14/21 0830  ceFEPIme (MAXIPIME) 2 g in sodium chloride 0.9 % 100 mL IVPB  Status:  Discontinued        2 g 200 mL/hr over 30 Minutes Intravenous Every 24 hours 10/14/21 0721 10/17/21 1055   10/14/21 0215  piperacillin-tazobactam (ZOSYN) IVPB 3.375 g        3.375 g 100 mL/hr over 30 Minutes Intravenous  Once 10/14/21 0126 10/14/21 0302   10/04/21 1100  vancomycin (VANCOREADY)  IVPB 1250 mg/250 mL  Status:  Discontinued        1,250 mg 166.7 mL/hr over 90 Minutes Intravenous Every 48 hours 10/02/21 0939 10/03/21 1508   10/02/21 2200  ceFEPIme (MAXIPIME) 2 g in sodium chloride 0.9 % 100 mL IVPB  Status:  Discontinued        2 g 200 mL/hr over 30 Minutes Intravenous Every 12 hours 10/02/21 0948 10/04/21 1010   10/02/21 1030  vancomycin (VANCOREADY) IVPB 2000 mg/400 mL        2,000 mg 200 mL/hr over 120 Minutes Intravenous  Once 10/02/21 0939 10/02/21 1324   09/27/21 0900  ceFEPIme (MAXIPIME) 2 g in sodium chloride 0.9 % 100 mL IVPB  Status:  Discontinued        2 g 200 mL/hr over 30 Minutes Intravenous Every 12 hours 09/27/21 0800 10/02/21 0948   09/27/21 0900  vancomycin (VANCOREADY) IVPB 1500 mg/300 mL  Status:  Discontinued        1,500 mg 150 mL/hr over 120 Minutes Intravenous Every 48 hours 09/27/21 0800 09/28/21 1058   09/21/21 0800  cefTRIAXone (ROCEPHIN) 2 g in sodium chloride 0.9 % 100 mL IVPB        2 g 200 mL/hr over 30 Minutes Intravenous Every 24 hours 09/21/21 0742 09/25/21 0808   09/20/21 0945  cefTRIAXone (ROCEPHIN) 2 g in sodium chloride 0.9 % 100 mL IVPB  Status:  Discontinued        2 g 200 mL/hr over 30 Minutes Intravenous Every 24 hours 09/20/21 0936 09/21/21 0742        I have personally reviewed the following labs and images: CBC: Recent Labs  Lab 10/30/21 0015 11/01/21 0030 11/03/21 0057 11/04/21 0139  WBC 8.5 9.0 9.6  --   HGB 8.0* 8.1* 8.2* 8.3*  HCT 27.0* 27.3* 27.6* 27.2*  MCV 97.5 95.8 96.2  --   PLT 286 257 260  --    BMP &GFR Recent Labs  Lab 10/30/21 0015 11/01/21 0030 11/03/21 0057 11/04/21 0139  NA 135 139 139 137  K 4.4 4.3 4.2 4.4  CL 100 101 102 104  CO2 29 30 30 24   GLUCOSE 280* 251* 147* 165*  BUN 29* 26* 22* 24*  CREATININE 1.81* 1.81* 1.68* 1.72*  CALCIUM 9.8 9.9 9.9 9.5  MG  --   --   --  1.7  PHOS  --   --   --  3.8   Estimated Creatinine Clearance: 44.9 mL/min (A) (by C-G formula  based on  SCr of 1.72 mg/dL (H)). Liver & Pancreas: Recent Labs  Lab 11/04/21 0139  ALBUMIN 2.3*   No results for input(s): LIPASE, AMYLASE in the last 168 hours. No results for input(s): AMMONIA in the last 168 hours. Diabetic: No results for input(s): HGBA1C in the last 72 hours. Recent Labs  Lab 11/04/21 1337 11/04/21 1701 11/04/21 2032 11/05/21 0731 11/05/21 1202  GLUCAP 175* 215* 240* 167* 173*   Cardiac Enzymes: No results for input(s): CKTOTAL, CKMB, CKMBINDEX, TROPONINI in the last 168 hours. No results for input(s): PROBNP in the last 8760 hours. Coagulation Profile: No results for input(s): INR, PROTIME in the last 168 hours. Thyroid Function Tests: No results for input(s): TSH, T4TOTAL, FREET4, T3FREE, THYROIDAB in the last 72 hours. Lipid Profile: No results for input(s): CHOL, HDL, LDLCALC, TRIG, CHOLHDL, LDLDIRECT in the last 72 hours. Anemia Panel: No results for input(s): VITAMINB12, FOLATE, FERRITIN, TIBC, IRON, RETICCTPCT in the last 72 hours. Urine analysis:    Component Value Date/Time   COLORURINE YELLOW 10/14/2021 1422   APPEARANCEUR CLOUDY (A) 10/14/2021 1422   LABSPEC 1.016 10/14/2021 1422   PHURINE 5.0 10/14/2021 1422   GLUCOSEU 50 (A) 10/14/2021 1422   HGBUR SMALL (A) 10/14/2021 1422   BILIRUBINUR NEGATIVE 10/14/2021 1422   KETONESUR NEGATIVE 10/14/2021 1422   PROTEINUR 100 (A) 10/14/2021 1422   NITRITE NEGATIVE 10/14/2021 1422   LEUKOCYTESUR NEGATIVE 10/14/2021 1422   Sepsis Labs: Invalid input(s): PROCALCITONIN, [place]  Microbiology: No results found for this or any previous visit (from the past 240 hour(s)).  Radiology Studies: No results found.    Vidyuth Belsito T. [place]  If 7PM-7AM, please contact night-coverage [email or web address] 11/05/2021, 1:34 PM

## 2021-11-05 NOTE — Progress Notes (Signed)
Speech Language Pathology Treatment: Maureen Jones Speaking valve  Patient Details Name: Maureen Jones MRN: [id number] DOB: August 28, 1966 Today's Date: 11/05/2021 Time: 2671-2458 SLP Time Calculation (min) (ACUTE ONLY): 20 min  Assessment / Plan / Recommendation Clinical Impression  Pt was seen for treatment. She was alert and cooperative during the session. Pt was asleep upon SLP's arrival, but she roused easily and was ameanable to participating in at least an abbreviated session. Pt independently donned the PMSV for the session and she tolerated the PMSV for the duration of the session with stable vitals. She reported that she has been working on diaphragmatic and respiratory support when speaking on the phone with her father. Pt was seated fully upright in the bed without any back support. This optimization of positioning resulted in significant improvement in respiratory support for speech and pt demonstrated improved coordination of respiration with speech. Pt participated in conversation with the SLP with reduced glottal fry and vocal quality was improved throughout utterances. Pt was noted to self-correct to improve respiratory support. Pt was pleased by SLP's encouragement regarding her progress and she reported that she will continue to practice between sessions. SLP will continue to follow pt.     HPI HPI: Pt is a 55 y/o female admitted with AMS 10/10. Stroke w/u negative, dx with acute metabolic encephalopathy in the setting of hypertensive emergency. ETT 10/10-10/12; reintubated 10/12 d/t increased WOB, not mobilizing secretions, stridor, received steroids, extubated 10/14 with lingering stridor, which was improving 10/15. ENT scoped pt 10/18: "Edema of the glottis and subglottic larynx following recent intubation and then repeat intubation.  There is no evidence of cord paralysis or any tumor present.  There is currently no stridor.  Allow more time for resolution of the swelling.   Unfortunately she is at risk for developing subglottic stenosis." SLP followed pt 10/15-10/24. MBS 10/19: trace posterior spillage, mild pharyngeal delay, trace transient penetration; regular texture diet with thin liquids recommended and SLP ultimately signed off with pt demonstrating adequate tolerance. 10/25: Stridor again noted; pt intubated and trach placed.  MBS 10/27: oropharyngeal dysphagia characterized by reduced bolus cohesion, reduced hyolaryngeal elevation, reduced anterior laryngeal movement, a pharyngeal delay, penetration (PAS 3, 5) with thin liquids. A dyspahgia 3 diet with nectar thick liquids was initiated and she was subsequently advanced to regular and thin liquids on 10/30. Pt with respiratory distress from disloged trach on 11/2 leading to PEA arrest with CPR x 10 min. ETT via stoma. Trach revised 11/2; #6XLT distal.  Cortrak placed 11/4. Trach changed from [place] distal to 6XL proximal 11/5 and then to 5XLT proximal 11/14.      SLP Plan  Continue with current plan of care      Recommendations for follow up therapy are one component of a multi-disciplinary discharge planning process, led by the attending physician.  Recommendations may be updated based on patient status, additional functional criteria and insurance authorization.    Recommendations  Diet recommendations: Regular;Thin liquid Liquids provided via: Cup;No straw Medication Administration: Whole meds with puree Supervision: Patient able to self feed Compensations: Slow rate;Small sips/bites      Patient may use Passy-Muir Speech Valve: During all waking hours (remove during sleep) PMSV Supervision: Intermittent         Oral Care Recommendations: Oral care BID Follow Up Recommendations: Home health SLP Assistance recommended at discharge: PRN SLP Visit Diagnosis: Dysphagia, pharyngeal phase (R13.13);[place]ph[place] (R49.1) Plan: Continue with current plan of care       Cavan Bearden I. Hardin Negus, [place],  [place] Office number [phone] Pager [place]  11/05/2021, 4:12 PM

## 2021-11-06 DIAGNOSIS — Z930 Tracheostomy status: Secondary | ICD-10-CM | POA: Diagnosis not present

## 2021-11-06 DIAGNOSIS — J9601 Acute respiratory failure with hypoxia: Secondary | ICD-10-CM | POA: Diagnosis not present

## 2021-11-06 DIAGNOSIS — G9340 Encephalopathy, unspecified: Secondary | ICD-10-CM | POA: Diagnosis not present

## 2021-11-06 DIAGNOSIS — J384 Edema of larynx: Secondary | ICD-10-CM | POA: Diagnosis not present

## 2021-11-06 LAB — RENAL FUNCTION PANEL
Albumin: 2.5 g/dL — ABNORMAL LOW (ref 3.5–5.0)
Anion gap: 7 (ref 5–15)
BUN: 29 mg/dL — ABNORMAL HIGH (ref 6–20)
CO2: 28 mmol/L (ref 22–32)
Calcium: 9.5 mg/dL (ref 8.9–10.3)
Chloride: 101 mmol/L (ref 98–111)
Creatinine, Ser: 1.89 mg/dL — ABNORMAL HIGH (ref 0.44–1.00)
GFR, Estimated: 31 mL/min — ABNORMAL LOW (ref >60)
Glucose, Bld: 189 mg/dL — ABNORMAL HIGH (ref 70–99)
Phosphorus: 3.2 mg/dL (ref 2.5–4.6)
Potassium: 4.3 mmol/L (ref 3.5–5.1)
Sodium: 136 mmol/L (ref 135–145)

## 2021-11-06 LAB — GLUCOSE, CAPILLARY
Glucose-Capillary: 155 mg/dL — ABNORMAL HIGH (ref 70–99)
Glucose-Capillary: 236 mg/dL — ABNORMAL HIGH (ref 70–99)
Glucose-Capillary: 120 mg/dL — ABNORMAL HIGH (ref 70–99)
Glucose-Capillary: 178 mg/dL — ABNORMAL HIGH (ref 70–99)

## 2021-11-06 LAB — HEMOGLOBIN AND HEMATOCRIT, BLOOD
HCT: 27 % — ABNORMAL LOW (ref 36.0–46.0)
Hemoglobin: 8.4 g/dL — ABNORMAL LOW (ref 12.0–15.0)

## 2021-11-06 LAB — MAGNESIUM: Magnesium: 1.7 mg/dL (ref 1.7–2.4)

## 2021-11-06 MED ORDER — MAGNESIUM SULFATE 2 GM/50ML IV SOLN
2.0000 g | Freq: Once | INTRAVENOUS | Status: AC
  Administered 2021-11-06: 2 g via INTRAVENOUS
  Filled 2021-11-06: qty 50

## 2021-11-06 MED ORDER — FUROSEMIDE 20 MG PO TABS
20.0000 mg | ORAL_TABLET | Freq: Every day | ORAL | Status: DC
  Administered 2021-11-06: 20 mg via ORAL
  Filled 2021-11-06: qty 1

## 2021-11-06 NOTE — Progress Notes (Signed)
PROGRESS NOTE  Maureen Jones KGS:[id number] DOB: Aug 11, 1966   PCP: Pcp, No  Patient is from: Home  DOA: 09/20/2021 LOS: 72  Chief complaints:  Chief Complaint  Patient presents with   Code Stroke     Brief Narrative / Interim history: 55 year old F with PMH of DM-2, CKD-3B, HTN and asthma presented to [facility]  on 10/10 with AMS, hypertensive emergency and acute hypoxic respiratory failure due to VCD/supraglottic and glottic edema with possible asthma exacerbation.  She was extubated on 10/12 but reintubated due to increased work of breathing.  She was extubated again on 10/14 but had to reintubated on 10/25 due to stridor and marked work of breathing.  Eventually, had tracheostomy.  Patient had PEA arrest after dislodged tracheostomy requiring 10 minutes of CPR and 2 mg of epi before ROSC on 11/2.  She had redo tracheostomy with 6 XLT.  She was started on trach collar on 11/8, and tracheostomy downsized to 5 uncuffed proximal XLT Shiley on 11/14.  PCCM following for trach.   Subjective: Seen and examined earlier this morning.  No major events overnight of this morning.  She is a sleepy but wakes to voice easily.  No complaints  Objective: Vitals:   11/06/21 1500 11/06/21 1531 11/06/21 1551 11/06/21 1610  BP: 136/73 109/65 136/73 123/60  Pulse:  81 82 79  Resp:  17  17  Temp:    98 F (36.7 C)  TempSrc:    Oral  SpO2:  95%  93%  Weight:      Height:        Intake/Output Summary (Last 24 hours) at 11/06/2021 1807 Last data filed at 11/06/2021 1100 Gross per 24 hour  Intake 480 ml  Output 750 ml  Net -270 ml   Filed Weights   11/05/21 0409 11/05/21 0500 11/06/21 0500  Weight: 102 kg 101 kg 103.8 kg    Examination:  GENERAL: No apparent distress.  Nontoxic. HEENT: MMM.  Vision and hearing grossly intact.  NECK: Tracheostomy in place. RESP: 93% via trach.  No IWOB.  Fair aeration bilaterally. CVS:  RRR. Heart sounds normal.  ABD/GI/GU: BS+. Abd soft, NTND.  MSK/EXT:   Moves extremities. No apparent deformity. No edema.  SKIN: no apparent skin lesion or wound NEURO: Awake and alert. Oriented appropriately.  No apparent focal neuro deficit. PSYCH: Calm. Normal affect.   Procedures:  See above  Microbiology summarized: 10/10-COVID-19 and influenza PCR nonreactive. 10/10, 10/17 and 11/3-MRSA PCR nonreactive. 10/10-blood cultures NGTD. 10/14, 30/81, 10/28, 11/3 and 11/5-respiratory culture with normal respiratory flora.  Assessment & Plan: Acute respiratory failure with hypoxia due to vocal cord dysfunction/supraglottic and glottic edema and possible asthma exacerbation -S/p multiple intubations and extubations -Trach on 10/25.  Currently on trach collar.  Downsized to #5 uncuffed trach.  Tolerating PMV -Continue Brovana, Pulmicort and as needed DuoNeb. -Tracheostomy care -Pulmonology following intermittently.  Uncontrolled DM-2 with hyperglycemia, gastroparesis and other complication: V8P 92%. Recent Labs  Lab 11/05/21 1638 11/05/21 2048 11/06/21 0734 11/06/21 1218 11/06/21 1611  GLUCAP 201* 173* 155* 236* 120*  -Continue SSI-moderate -Continue Levemir 25 units twice daily -Continue Lovenox 6 units 3 times daily with meals -Continue Reglan -Further adjustment as appropriate -Continue atorvastatin.  PEA arrest-in the setting of dislodged tracheostomy-ROSC after 10 minutes of CPR and 2 mg of epi. -Optimize electrolytes -Continue telemetry monitoring  Uncontrolled hypertension: Normotensive. -Continue with Norvasc, hydralazine -Increased Coreg from 12.5 mg to 25 mg twice daily on 11/25 -Continue Imdur 30 mg daily -Decrease  Lasix to 20 mg daily.  Creatinine slightly up.  CKD-3B/azotemia: Stable. Recent Labs    10/21/21 0506 10/23/21 0145 10/25/21 0415 10/27/21 0114 10/28/21 0059 10/30/21 0015 11/01/21 0030 11/03/21 0057 11/04/21 0139 11/06/21 0116  BUN 34* 23* 31* 33* 33* 29* 26* 22* 24* 29*  CREATININE 2.00* 1.95* 2.05* 2.03*  1.92* 1.81* 1.81* 1.68* 1.72* 1.89*  -Decrease Lasix as above -Recheck in the morning   Health Care associated pneumonia:  -Completed 7 days of antibiotics   Agitation-likely ICU delirium.  Seems to have resolved. -Continue to wean down clonazepam and and Seroquel gradually   Normocytic  anemia: H&H relatively stable.  Anemia panel basically normal. Recent Labs    10/21/21 0506 10/23/21 0145 10/25/21 0415 10/27/21 0114 10/28/21 0059 10/30/21 0015 11/01/21 0030 11/03/21 0057 11/04/21 0139 11/06/21 0116  HGB 7.7* 8.0* 7.8* 8.1* 8.0* 8.0* 8.1* 8.2* 8.3* 8.4*  -Continue monitoring   Hypomagnesemia and hypophosphatemia: Mg 1.7. -IV magnesium sulfate 2 g x 1   Dysphagia/nutrition: Likely from intubation and tracheostomy.  Seems to have resolved. -Continue regular diet  Increased nutrient needs -Continue multivitamins, supplements -Appreciate input by dietitian   DVT prophylaxis:  heparin injection 5,000 Units Start: 10/15/21 1400 SCDs Start: 09/20/21 0831  Code Status: Partial code with NIPPV/intubation/ACLS meds but no CPR or DCCV. Family Communication: Patient and/or RN. Available if any question.  Level of care: Progressive Status is: Inpatient  Remains inpatient appropriate because: Safe disposition with tracheostomy  Final disposition-TBD  Consultants:  Pulmonology Cardiology   Sch Meds:  Scheduled Meds:  amLODipine  10 mg Oral Daily   arformoterol  15 mcg Nebulization BID   aspirin  81 mg Oral Daily   atorvastatin  20 mg Oral Daily   budesonide (PULMICORT) nebulizer solution  0.25 mg Nebulization BID   carvedilol  25 mg Oral BID   chlorhexidine  15 mL Mouth Rinse BID   Chlorhexidine Gluconate Cloth  6 each Topical Daily   clonazePAM  0.5 mg Oral BID   DULoxetine  60 mg Oral Daily   feeding supplement (GLUCERNA SHAKE)  237 mL Oral BID BM   ferrous sulfate  325 mg Oral Q breakfast   fluticasone  2 spray Each Nare Daily   furosemide  20 mg Oral Daily    guaiFENesin  1,200 mg Oral BID   heparin injection (subcutaneous)  5,000 Units Subcutaneous Q8H   hydrALAZINE  100 mg Oral Q8H   insulin aspart  0-15 Units Subcutaneous TID WC   insulin aspart  0-5 Units Subcutaneous QHS   insulin aspart  6 Units Subcutaneous TID WC   insulin detemir  25 Units Subcutaneous BID   ipratropium-albuterol  3 mL Nebulization BID   isosorbide mononitrate  30 mg Oral Daily   lidocaine  2 patch Transdermal Q24H   loratadine  10 mg Oral Daily   mouth rinse  15 mL Mouth Rinse q12n4p   metoCLOPramide  5 mg Oral TID AC   montelukast  10 mg Oral QHS   multivitamin with minerals  1 tablet Oral Daily   pantoprazole  40 mg Oral BID   QUEtiapine  50 mg Oral BID   sodium chloride flush  10-40 mL Intracatheter Q12H   Continuous Infusions:  sodium chloride Stopped (10/21/21 0722)   PRN Meds:.sodium chloride, acetaminophen, albuterol, dextrose, docusate sodium, guaiFENesin, hydrALAZINE, nitroGLYCERIN, ondansetron (ZOFRAN) IV, phenol, polyethylene glycol, sodium chloride  Antimicrobials: Anti-infectives (From admission, onward)    Start     Dose/Rate Route Frequency Ordered  Stop   10/18/21 0830  ceFEPIme (MAXIPIME) 2 g in sodium chloride 0.9 % 100 mL IVPB  Status:  Discontinued        2 g 200 mL/hr over 30 Minutes Intravenous Every 24 hours 10/17/21 1055 10/17/21 1056   10/17/21 2200  ceFEPIme (MAXIPIME) 2 g in sodium chloride 0.9 % 100 mL IVPB        2 g 200 mL/hr over 30 Minutes Intravenous Every 12 hours 10/17/21 1056 10/18/21 2145   10/14/21 0830  ceFEPIme (MAXIPIME) 2 g in sodium chloride 0.9 % 100 mL IVPB  Status:  Discontinued        2 g 200 mL/hr over 30 Minutes Intravenous Every 24 hours 10/14/21 0721 10/17/21 1055   10/14/21 0215  piperacillin-tazobactam (ZOSYN) IVPB 3.375 g        3.375 g 100 mL/hr over 30 Minutes Intravenous  Once 10/14/21 0126 10/14/21 0302   10/04/21 1100  vancomycin (VANCOREADY) IVPB 1250 mg/250 mL  Status:  Discontinued         1,250 mg 166.7 mL/hr over 90 Minutes Intravenous Every 48 hours 10/02/21 0939 10/03/21 1508   10/02/21 2200  ceFEPIme (MAXIPIME) 2 g in sodium chloride 0.9 % 100 mL IVPB  Status:  Discontinued        2 g 200 mL/hr over 30 Minutes Intravenous Every 12 hours 10/02/21 0948 10/04/21 1010   10/02/21 1030  vancomycin (VANCOREADY) IVPB 2000 mg/400 mL        2,000 mg 200 mL/hr over 120 Minutes Intravenous  Once 10/02/21 0939 10/02/21 1324   09/27/21 0900  ceFEPIme (MAXIPIME) 2 g in sodium chloride 0.9 % 100 mL IVPB  Status:  Discontinued        2 g 200 mL/hr over 30 Minutes Intravenous Every 12 hours 09/27/21 0800 10/02/21 0948   09/27/21 0900  vancomycin (VANCOREADY) IVPB 1500 mg/300 mL  Status:  Discontinued        1,500 mg 150 mL/hr over 120 Minutes Intravenous Every 48 hours 09/27/21 0800 09/28/21 1058   09/21/21 0800  cefTRIAXone (ROCEPHIN) 2 g in sodium chloride 0.9 % 100 mL IVPB        2 g 200 mL/hr over 30 Minutes Intravenous Every 24 hours 09/21/21 0742 09/25/21 0808   09/20/21 0945  cefTRIAXone (ROCEPHIN) 2 g in sodium chloride 0.9 % 100 mL IVPB  Status:  Discontinued        2 g 200 mL/hr over 30 Minutes Intravenous Every 24 hours 09/20/21 0936 09/21/21 0742        I have personally reviewed the following labs and images: CBC: Recent Labs  Lab 11/01/21 0030 11/03/21 0057 11/04/21 0139 11/06/21 0116  WBC 9.0 9.6  --   --   HGB 8.1* 8.2* 8.3* 8.4*  HCT 27.3* 27.6* 27.2* 27.0*  MCV 95.8 96.2  --   --   PLT 257 260  --   --    BMP &GFR Recent Labs  Lab 11/01/21 0030 11/03/21 0057 11/04/21 0139 11/06/21 0116  NA 139 139 137 136  K 4.3 4.2 4.4 4.3  CL 101 102 104 101  CO2 30 30 24 28   GLUCOSE 251* 147* 165* 189*  BUN 26* 22* 24* 29*  CREATININE 1.81* 1.68* 1.72* 1.89*  CALCIUM 9.9 9.9 9.5 9.5  MG  --   --  1.7 1.7  PHOS  --   --  3.8 3.2   Estimated Creatinine Clearance: 41.4 mL/min (A) (by C-G formula based on  SCr of 1.89 mg/dL (H)). Liver & Pancreas: Recent  Labs  Lab 11/04/21 0139 11/06/21 0116  ALBUMIN 2.3* 2.5*   No results for input(s): LIPASE, AMYLASE in the last 168 hours. No results for input(s): AMMONIA in the last 168 hours. Diabetic: No results for input(s): HGBA1C in the last 72 hours. Recent Labs  Lab 11/05/21 1638 11/05/21 2048 11/06/21 0734 11/06/21 1218 11/06/21 1611  GLUCAP 201* 173* 155* 236* 120*   Cardiac Enzymes: No results for input(s): CKTOTAL, CKMB, CKMBINDEX, TROPONINI in the last 168 hours. No results for input(s): PROBNP in the last 8760 hours. Coagulation Profile: No results for input(s): INR, PROTIME in the last 168 hours. Thyroid Function Tests: No results for input(s): TSH, T4TOTAL, FREET4, T3FREE, THYROIDAB in the last 72 hours. Lipid Profile: No results for input(s): CHOL, HDL, LDLCALC, TRIG, CHOLHDL, LDLDIRECT in the last 72 hours. Anemia Panel: No results for input(s): VITAMINB12, FOLATE, FERRITIN, TIBC, IRON, RETICCTPCT in the last 72 hours. Urine analysis:    Component Value Date/Time   COLORURINE YELLOW 10/14/2021 1422   APPEARANCEUR CLOUDY (A) 10/14/2021 1422   LABSPEC 1.016 10/14/2021 1422   PHURINE 5.0 10/14/2021 1422   GLUCOSEU 50 (A) 10/14/2021 1422   HGBUR SMALL (A) 10/14/2021 1422   BILIRUBINUR NEGATIVE 10/14/2021 1422   KETONESUR NEGATIVE 10/14/2021 1422   PROTEINUR 100 (A) 10/14/2021 1422   NITRITE NEGATIVE 10/14/2021 1422   LEUKOCYTESUR NEGATIVE 10/14/2021 1422   Sepsis Labs: Invalid input(s): PROCALCITONIN, [place]  Microbiology: No results found for this or any previous visit (from the past 240 hour(s)).  Radiology Studies: No results found.    Maureen Jones T. Chamberino  If 7PM-7AM, please contact night-coverage [email or web address] 11/06/2021, 6:07 PM

## 2021-11-07 DIAGNOSIS — G9340 Encephalopathy, unspecified: Secondary | ICD-10-CM | POA: Diagnosis not present

## 2021-11-07 DIAGNOSIS — J384 Edema of larynx: Secondary | ICD-10-CM | POA: Diagnosis not present

## 2021-11-07 DIAGNOSIS — J9601 Acute respiratory failure with hypoxia: Secondary | ICD-10-CM | POA: Diagnosis not present

## 2021-11-07 DIAGNOSIS — Z930 Tracheostomy status: Secondary | ICD-10-CM | POA: Diagnosis not present

## 2021-11-07 LAB — MAGNESIUM: Magnesium: 2.2 mg/dL (ref 1.7–2.4)

## 2021-11-07 LAB — RENAL FUNCTION PANEL
Albumin: 2.5 g/dL — ABNORMAL LOW (ref 3.5–5.0)
Anion gap: 7 (ref 5–15)
BUN: 30 mg/dL — ABNORMAL HIGH (ref 6–20)
CO2: 28 mmol/L (ref 22–32)
Calcium: 9.6 mg/dL (ref 8.9–10.3)
Chloride: 103 mmol/L (ref 98–111)
Creatinine, Ser: 2.04 mg/dL — ABNORMAL HIGH (ref 0.44–1.00)
GFR, Estimated: 28 mL/min — ABNORMAL LOW (ref >60)
Glucose, Bld: 157 mg/dL — ABNORMAL HIGH (ref 70–99)
Phosphorus: 4.1 mg/dL (ref 2.5–4.6)
Potassium: 4.2 mmol/L (ref 3.5–5.1)
Sodium: 138 mmol/L (ref 135–145)

## 2021-11-07 LAB — HEMOGLOBIN AND HEMATOCRIT, BLOOD
HCT: 27.3 % — ABNORMAL LOW (ref 36.0–46.0)
Hemoglobin: 8.2 g/dL — ABNORMAL LOW (ref 12.0–15.0)

## 2021-11-07 LAB — GLUCOSE, CAPILLARY
Glucose-Capillary: 155 mg/dL — ABNORMAL HIGH (ref 70–99)
Glucose-Capillary: 172 mg/dL — ABNORMAL HIGH (ref 70–99)
Glucose-Capillary: 113 mg/dL — ABNORMAL HIGH (ref 70–99)
Glucose-Capillary: 120 mg/dL — ABNORMAL HIGH (ref 70–99)

## 2021-11-07 MED ORDER — INSULIN DETEMIR 100 UNIT/ML ~~LOC~~ SOLN
27.0000 [IU] | Freq: Two times a day (BID) | SUBCUTANEOUS | Status: DC
Start: 2021-11-07 — End: 2021-11-12
  Administered 2021-11-07 – 2021-11-11 (×9): 27 [IU] via SUBCUTANEOUS
  Filled 2021-11-07 (×13): qty 0.27

## 2021-11-07 NOTE — Progress Notes (Signed)
PROGRESS NOTE  Maureen Jones NUU:[id number] DOB: April 01, 1966   PCP: Pcp, No  Patient is from: Home  DOA: 09/20/2021 LOS: 31  Chief complaints:  Chief Complaint  Patient presents with   Code Stroke     Brief Narrative / Interim history: 55 year old F with PMH of DM-2, CKD-3B, HTN and asthma presented to [facility]  on 10/10 with AMS, hypertensive emergency and acute hypoxic respiratory failure due to VCD/supraglottic and glottic edema with possible asthma exacerbation.  She was extubated on 10/12 but reintubated due to increased work of breathing.  She was extubated again on 10/14 but had to reintubated on 10/25 due to stridor and marked work of breathing.  Eventually, had tracheostomy.  Patient had PEA arrest after dislodged tracheostomy requiring 10 minutes of CPR and 2 mg of epi before ROSC on 11/2.  She had redo tracheostomy with 6 XLT.  She was started on trach collar on 11/8, and tracheostomy downsized to 5 uncuffed proximal XLT Shiley on 11/14.  PCCM following for trach.   Subjective: Seen and examined earlier this morning.  No major events overnight of this morning.  No complaints.  She denies chest pain, dyspnea, GI or UTI symptoms.   Objective: Vitals:   11/07/21 0346 11/07/21 0500 11/07/21 0754 11/07/21 0859  BP: (!) 143/74  (!) 149/79 (!) 149/79  Pulse: 80  84 88  Resp: 19  16 16   Temp: 98.2 F (36.8 C)  98.2 F (36.8 C)   TempSrc: Oral  Oral   SpO2: 94%  96%   Weight:  102.6 kg    Height:        Intake/Output Summary (Last 24 hours) at 11/07/2021 1148 Last data filed at 11/07/2021 0000 Gross per 24 hour  Intake 60 ml  Output --  Net 60 ml   Filed Weights   11/05/21 0500 11/06/21 0500 11/07/21 0500  Weight: 101 kg 103.8 kg 102.6 kg    Examination:  GENERAL: No apparent distress.  Nontoxic. HEENT: MMM.  Vision and hearing grossly intact.  NECK: Tracheostomy in place.  Difficult to assess JVD. RESP: 94% on 8 L / 35% FiO2.  No IWOB.  Fair aeration  bilaterally. CVS:  RRR. Heart sounds normal.  ABD/GI/GU: BS+. Abd soft, NTND.  MSK/EXT:  Moves extremities. No apparent deformity.  Trace edema. SKIN: no apparent skin lesion or wound NEURO: Awake and alert. Oriented appropriately.  No apparent focal neuro deficit. PSYCH: Calm. Normal affect.   Procedures:  See above  Microbiology summarized: 10/10-COVID-19 and influenza PCR nonreactive. 10/10, 10/17 and 11/3-MRSA PCR nonreactive. 10/10-blood cultures NGTD. 10/14, 30/81, 10/28, 11/3 and 11/5-respiratory culture with normal respiratory flora.  Assessment & Plan: Acute respiratory failure with hypoxia due to vocal cord dysfunction/supraglottic and glottic edema and possible asthma exacerbation -S/p multiple intubations and extubations -Trach on 10/25.  Currently on trach collar.  Downsized to #5 uncuffed trach.  Tolerating PMV -Continue Brovana, Pulmicort and as needed DuoNeb. -Tracheostomy care -Pulmonology following intermittently.  Uncontrolled DM-2 with hyperglycemia, gastroparesis and other complication: H4V 42%. Recent Labs  Lab 11/06/21 0734 11/06/21 1218 11/06/21 1611 11/06/21 2059 11/07/21 0759  GLUCAP 155* 236* 120* 178* 155*  -Continue SSI-moderate -Increase Levemir from 25 to 27 units twice daily. -Continue Lovenox 6 units 3 times daily with meals -Continue Reglan -Further adjustment as appropriate -Continue atorvastatin.  PEA arrest-in the setting of dislodged tracheostomy-ROSC after 10 minutes of CPR and 2 mg of epi. -Optimize electrolytes -Continue telemetry monitoring  Uncontrolled hypertension: BP slightly elevated. -Continue  with Norvasc, hydralazine -Increased Coreg from 12.5 mg to 25 mg twice daily on 11/25 -Continue Imdur 30 mg daily.  May increase this to 60 mg daily -Stop Lasix given AKI  AKI on CKD-3B/azotemia: Cr slightly up after Lasix. Recent Labs    10/23/21 0145 10/25/21 0415 10/27/21 0114 10/28/21 0059 10/30/21 0015 11/01/21 0030  11/03/21 0057 11/04/21 0139 11/06/21 0116 11/07/21 0102  BUN 23* 31* 33* 33* 29* 26* 22* 24* 29* 30*  CREATININE 1.95* 2.05* 2.03* 1.92* 1.81* 1.81* 1.68* 1.72* 1.89* 2.04*  -Stop Lasix -Recheck renal function in the morning   Health Care associated pneumonia:  -Completed 7 days of antibiotics   Agitation-likely ICU delirium.  Seems to have resolved. -Continue to wean down clonazepam and and Seroquel gradually   Normocytic  anemia: H&H relatively stable.  Anemia panel basically normal. Recent Labs    10/23/21 0145 10/25/21 0415 10/27/21 0114 10/28/21 0059 10/30/21 0015 11/01/21 0030 11/03/21 0057 11/04/21 0139 11/06/21 0116 11/07/21 0102  HGB 8.0* 7.8* 8.1* 8.0* 8.0* 8.1* 8.2* 8.3* 8.4* 8.2*  -Continue monitoring   Hypomagnesemia and hypophosphatemia: Resolved.   Dysphagia/nutrition: Resolved. -Continue regular diet per SLP  Increased nutrient needs -Continue multivitamins, supplements -Appreciate input by dietitian   DVT prophylaxis:  heparin injection 5,000 Units Start: 10/15/21 1400 SCDs Start: 09/20/21 0831  Code Status: Partial code with NIPPV/intubation/ACLS meds but no CPR or DCCV. Family Communication: Patient and/or RN. Available if any question.  Level of care: Progressive Status is: Inpatient  Remains inpatient appropriate because: Safe disposition with tracheostomy  Final disposition-TBD  Consultants:  Pulmonology Cardiology   Sch Meds:  Scheduled Meds:  amLODipine  10 mg Oral Daily   arformoterol  15 mcg Nebulization BID   aspirin  81 mg Oral Daily   atorvastatin  20 mg Oral Daily   budesonide (PULMICORT) nebulizer solution  0.25 mg Nebulization BID   carvedilol  25 mg Oral BID   chlorhexidine  15 mL Mouth Rinse BID   Chlorhexidine Gluconate Cloth  6 each Topical Daily   clonazePAM  0.5 mg Oral BID   DULoxetine  60 mg Oral Daily   feeding supplement (GLUCERNA SHAKE)  237 mL Oral BID BM   ferrous sulfate  325 mg Oral Q breakfast    fluticasone  2 spray Each Nare Daily   guaiFENesin  1,200 mg Oral BID   heparin injection (subcutaneous)  5,000 Units Subcutaneous Q8H   hydrALAZINE  100 mg Oral Q8H   insulin aspart  0-15 Units Subcutaneous TID WC   insulin aspart  0-5 Units Subcutaneous QHS   insulin aspart  6 Units Subcutaneous TID WC   insulin detemir  25 Units Subcutaneous BID   ipratropium-albuterol  3 mL Nebulization BID   isosorbide mononitrate  30 mg Oral Daily   lidocaine  2 patch Transdermal Q24H   loratadine  10 mg Oral Daily   mouth rinse  15 mL Mouth Rinse q12n4p   metoCLOPramide  5 mg Oral TID AC   montelukast  10 mg Oral QHS   multivitamin with minerals  1 tablet Oral Daily   pantoprazole  40 mg Oral BID   QUEtiapine  50 mg Oral BID   sodium chloride flush  10-40 mL Intracatheter Q12H   Continuous Infusions:  sodium chloride Stopped (10/21/21 0722)   PRN Meds:.sodium chloride, acetaminophen, albuterol, dextrose, docusate sodium, guaiFENesin, hydrALAZINE, nitroGLYCERIN, ondansetron (ZOFRAN) IV, phenol, polyethylene glycol, sodium chloride  Antimicrobials: Anti-infectives (From admission, onward)    Start  Dose/Rate Route Frequency Ordered Stop   10/18/21 0830  ceFEPIme (MAXIPIME) 2 g in sodium chloride 0.9 % 100 mL IVPB  Status:  Discontinued        2 g 200 mL/hr over 30 Minutes Intravenous Every 24 hours 10/17/21 1055 10/17/21 1056   10/17/21 2200  ceFEPIme (MAXIPIME) 2 g in sodium chloride 0.9 % 100 mL IVPB        2 g 200 mL/hr over 30 Minutes Intravenous Every 12 hours 10/17/21 1056 10/18/21 2145   10/14/21 0830  ceFEPIme (MAXIPIME) 2 g in sodium chloride 0.9 % 100 mL IVPB  Status:  Discontinued        2 g 200 mL/hr over 30 Minutes Intravenous Every 24 hours 10/14/21 0721 10/17/21 1055   10/14/21 0215  piperacillin-tazobactam (ZOSYN) IVPB 3.375 g        3.375 g 100 mL/hr over 30 Minutes Intravenous  Once 10/14/21 0126 10/14/21 0302   10/04/21 1100  vancomycin (VANCOREADY) IVPB 1250 mg/250  mL  Status:  Discontinued        1,250 mg 166.7 mL/hr over 90 Minutes Intravenous Every 48 hours 10/02/21 0939 10/03/21 1508   10/02/21 2200  ceFEPIme (MAXIPIME) 2 g in sodium chloride 0.9 % 100 mL IVPB  Status:  Discontinued        2 g 200 mL/hr over 30 Minutes Intravenous Every 12 hours 10/02/21 0948 10/04/21 1010   10/02/21 1030  vancomycin (VANCOREADY) IVPB 2000 mg/400 mL        2,000 mg 200 mL/hr over 120 Minutes Intravenous  Once 10/02/21 0939 10/02/21 1324   09/27/21 0900  ceFEPIme (MAXIPIME) 2 g in sodium chloride 0.9 % 100 mL IVPB  Status:  Discontinued        2 g 200 mL/hr over 30 Minutes Intravenous Every 12 hours 09/27/21 0800 10/02/21 0948   09/27/21 0900  vancomycin (VANCOREADY) IVPB 1500 mg/300 mL  Status:  Discontinued        1,500 mg 150 mL/hr over 120 Minutes Intravenous Every 48 hours 09/27/21 0800 09/28/21 1058   09/21/21 0800  cefTRIAXone (ROCEPHIN) 2 g in sodium chloride 0.9 % 100 mL IVPB        2 g 200 mL/hr over 30 Minutes Intravenous Every 24 hours 09/21/21 0742 09/25/21 0808   09/20/21 0945  cefTRIAXone (ROCEPHIN) 2 g in sodium chloride 0.9 % 100 mL IVPB  Status:  Discontinued        2 g 200 mL/hr over 30 Minutes Intravenous Every 24 hours 09/20/21 0936 09/21/21 0742        I have personally reviewed the following labs and images: CBC: Recent Labs  Lab 11/01/21 0030 11/03/21 0057 11/04/21 0139 11/06/21 0116 11/07/21 0102  WBC 9.0 9.6  --   --   --   HGB 8.1* 8.2* 8.3* 8.4* 8.2*  HCT 27.3* 27.6* 27.2* 27.0* 27.3*  MCV 95.8 96.2  --   --   --   PLT 257 260  --   --   --    BMP &GFR Recent Labs  Lab 11/01/21 0030 11/03/21 0057 11/04/21 0139 11/06/21 0116 11/07/21 0102  NA 139 139 137 136 138  K 4.3 4.2 4.4 4.3 4.2  CL 101 102 104 101 103  CO2 30 30 24 28 28   GLUCOSE 251* 147* 165* 189* 157*  BUN 26* 22* 24* 29* 30*  CREATININE 1.81* 1.68* 1.72* 1.89* 2.04*  CALCIUM 9.9 9.9 9.5 9.5 9.6  MG  --   --  1.7 1.7 2.2  PHOS  --   --  3.8 3.2  4.1   Estimated Creatinine Clearance: 38.1 mL/min (A) (by C-G formula based on SCr of 2.04 mg/dL (H)). Liver & Pancreas: Recent Labs  Lab 11/04/21 0139 11/06/21 0116 11/07/21 0102  ALBUMIN 2.3* 2.5* 2.5*   No results for input(s): LIPASE, AMYLASE in the last 168 hours. No results for input(s): AMMONIA in the last 168 hours. Diabetic: No results for input(s): HGBA1C in the last 72 hours. Recent Labs  Lab 11/06/21 0734 11/06/21 1218 11/06/21 1611 11/06/21 2059 11/07/21 0759  GLUCAP 155* 236* 120* 178* 155*   Cardiac Enzymes: No results for input(s): CKTOTAL, CKMB, CKMBINDEX, TROPONINI in the last 168 hours. No results for input(s): PROBNP in the last 8760 hours. Coagulation Profile: No results for input(s): INR, PROTIME in the last 168 hours. Thyroid Function Tests: No results for input(s): TSH, T4TOTAL, FREET4, T3FREE, THYROIDAB in the last 72 hours. Lipid Profile: No results for input(s): CHOL, HDL, LDLCALC, TRIG, CHOLHDL, LDLDIRECT in the last 72 hours. Anemia Panel: No results for input(s): VITAMINB12, FOLATE, FERRITIN, TIBC, IRON, RETICCTPCT in the last 72 hours. Urine analysis:    Component Value Date/Time   COLORURINE YELLOW 10/14/2021 1422   APPEARANCEUR CLOUDY (A) 10/14/2021 1422   LABSPEC 1.016 10/14/2021 1422   PHURINE 5.0 10/14/2021 1422   GLUCOSEU 50 (A) 10/14/2021 1422   HGBUR SMALL (A) 10/14/2021 1422   BILIRUBINUR NEGATIVE 10/14/2021 1422   KETONESUR NEGATIVE 10/14/2021 1422   PROTEINUR 100 (A) 10/14/2021 1422   NITRITE NEGATIVE 10/14/2021 1422   LEUKOCYTESUR NEGATIVE 10/14/2021 1422   Sepsis Labs: Invalid input(s): PROCALCITONIN, [place]  Microbiology: No results found for this or any previous visit (from the past 240 hour(s)).  Radiology Studies: No results found.    Anmarie Fukushima T. Bertie  If 7PM-7AM, please contact night-coverage [email or web address] 11/07/2021, 11:48 AM

## 2021-11-08 DIAGNOSIS — J9601 Acute respiratory failure with hypoxia: Secondary | ICD-10-CM | POA: Diagnosis not present

## 2021-11-08 DIAGNOSIS — J384 Edema of larynx: Secondary | ICD-10-CM | POA: Diagnosis not present

## 2021-11-08 DIAGNOSIS — Z930 Tracheostomy status: Secondary | ICD-10-CM | POA: Diagnosis not present

## 2021-11-08 DIAGNOSIS — J383 Other diseases of vocal cords: Secondary | ICD-10-CM | POA: Diagnosis not present

## 2021-11-08 LAB — RENAL FUNCTION PANEL
Albumin: 2.5 g/dL — ABNORMAL LOW (ref 3.5–5.0)
Anion gap: 7 (ref 5–15)
BUN: 29 mg/dL — ABNORMAL HIGH (ref 6–20)
CO2: 27 mmol/L (ref 22–32)
Calcium: 9.7 mg/dL (ref 8.9–10.3)
Chloride: 105 mmol/L (ref 98–111)
Creatinine, Ser: 1.83 mg/dL — ABNORMAL HIGH (ref 0.44–1.00)
GFR, Estimated: 32 mL/min — ABNORMAL LOW (ref >60)
Glucose, Bld: 151 mg/dL — ABNORMAL HIGH (ref 70–99)
Phosphorus: 4 mg/dL (ref 2.5–4.6)
Potassium: 4.1 mmol/L (ref 3.5–5.1)
Sodium: 139 mmol/L (ref 135–145)

## 2021-11-08 LAB — MAGNESIUM: Magnesium: 2 mg/dL (ref 1.7–2.4)

## 2021-11-08 LAB — GLUCOSE, CAPILLARY
Glucose-Capillary: 128 mg/dL — ABNORMAL HIGH (ref 70–99)
Glucose-Capillary: 204 mg/dL — ABNORMAL HIGH (ref 70–99)
Glucose-Capillary: 141 mg/dL — ABNORMAL HIGH (ref 70–99)
Glucose-Capillary: 161 mg/dL — ABNORMAL HIGH (ref 70–99)

## 2021-11-08 LAB — HEMOGLOBIN AND HEMATOCRIT, BLOOD
HCT: 27.3 % — ABNORMAL LOW (ref 36.0–46.0)
Hemoglobin: 8.3 g/dL — ABNORMAL LOW (ref 12.0–15.0)

## 2021-11-08 NOTE — Progress Notes (Signed)
Trach education was done with the patient. Patient was shown how to change inner cannula & gauze. Patient was instructed to call 911 if her trach were to come out at home. Patient was made aware that her family needed to come in tomorrow for trach education. Patient stated that she didn't know if her family would come.

## 2021-11-08 NOTE — Progress Notes (Addendum)
SATURATION QUALIFICATIONS: (This note is used to comply with regulatory documentation for home oxygen)  Patient Saturations on Room Air at Rest = 86%  Patient Saturations on Room Air while Ambulating = n/a%  Patient Saturations on 8 Liters of oxygen while Ambulating = 94%  Please briefly explain why patient needs home oxygen: Patient requires 8L, 35% on trach collar at rest and while ambulating to prevent desats.

## 2021-11-08 NOTE — Progress Notes (Signed)
PROGRESS NOTE  Maureen Jones UGQ:[id number] DOB: 1966/04/19   PCP: Pcp, No  Patient is from: Home  DOA: 09/20/2021 LOS: 29  Chief complaints:  Chief Complaint  Patient presents with   Code Stroke     Brief Narrative / Interim history: 55 year old F with PMH of DM-2, CKD-3B, HTN and asthma presented to [facility]  on 10/10 with AMS, hypertensive emergency and acute hypoxic respiratory failure due to VCD/supraglottic and glottic edema with possible asthma exacerbation.  She was extubated on 10/12 but reintubated due to increased work of breathing.  She was extubated again on 10/14 but had to reintubated on 10/25 due to stridor and marked work of breathing.  Eventually, had tracheostomy.  Patient had PEA arrest after dislodged tracheostomy requiring 10 minutes of CPR and 2 mg of epi before ROSC on 11/2.  She had redo tracheostomy with 6 XLT.  She was started on trach collar on 11/8, and tracheostomy downsized to 5 uncuffed proximal XLT Shiley on 11/14.  PCCM following for trach.   Subjective: Seen and examined earlier this morning.  No major events overnight of this morning.  No complaints.  Objective: Vitals:   11/08/21 1126 11/08/21 1209 11/08/21 1545 11/08/21 1558  BP: (!) 145/75  124/70 124/70  Pulse: 82  81 82  Resp: 16 18 15    Temp: 97.8 F (36.6 C)  98.1 F (36.7 C)   TempSrc: Oral  Oral   SpO2: 96% 95% 100%   Weight:      Height:        Intake/Output Summary (Last 24 hours) at 11/08/2021 1559 Last data filed at 11/08/2021 0704 Gross per 24 hour  Intake 120 ml  Output 650 ml  Net -530 ml   Filed Weights   11/06/21 0500 11/07/21 0500 11/08/21 0500  Weight: 103.8 kg 102.6 kg 102.7 kg    Examination:  GENERAL: No apparent distress.  Nontoxic. HEENT: MMM.  Vision and hearing grossly intact.  NECK: Tracheostomy. RESP: 100% on 10 L / 40% FiO2.  No IWOB.  Fair aeration bilaterally. CVS:  RRR. Heart sounds normal.  ABD/GI/GU: BS+. Abd soft, NTND.  MSK/EXT:  Moves  extremities. No apparent deformity. No edema.  SKIN: no apparent skin lesion or wound NEURO: Awake and alert. Oriented appropriately.  No apparent focal neuro deficit. PSYCH: Calm. Normal affect.   Procedures:  See above  Microbiology summarized: 10/10-COVID-19 and influenza PCR nonreactive. 10/10, 10/17 and 11/3-MRSA PCR nonreactive. 10/10-blood cultures NGTD. 10/14, 30/81, 10/28, 11/3 and 11/5-respiratory culture with normal respiratory flora.  Assessment & Plan: Acute respiratory failure with hypoxia due to vocal cord dysfunction/supraglottic and glottic edema and possible asthma exacerbation -S/p multiple intubations and extubations -Trach on 10/25. Downsized to #5 uncuffed trach.  Tolerating PMV.  Weaning oxygen. -Continue Brovana, Pulmicort and as needed DuoNeb. -Tracheostomy care -Pulmonology following intermittently.  Uncontrolled DM-2 with hyperglycemia, gastroparesis and other complication: U8K 80%. Recent Labs  Lab 11/07/21 1643 11/07/21 2027 11/08/21 0748 11/08/21 1125 11/08/21 1548  GLUCAP 113* 120* 128* 204* 141*  -Continue SSI-moderate -Continue Levemir 27 units twice daily. -Continue Lovenox 6 units 3 times daily with meals -Continue Reglan -Further adjustment as appropriate -Continue atorvastatin.  PEA arrest-in the setting of dislodged tracheostomy-ROSC after 10 minutes of CPR and 2 mg of epi. -Optimize electrolytes -Continue telemetry monitoring  Uncontrolled hypertension: Normotensive today. -Continue with Norvasc, hydralazine, Imdur and Coreg  AKI on CKD-3B/azotemia: Cr slightly up after Lasix.  Improving. Recent Labs    10/25/21 0415 10/27/21 0114 10/28/21  6195 10/30/21 0015 11/01/21 0030 11/03/21 0057 11/04/21 0139 11/06/21 0116 11/07/21 0102 11/08/21 0125  BUN 31* 33* 33* 29* 26* 22* 24* 29* 30* 29*  CREATININE 2.05* 2.03* 1.92* 1.81* 1.81* 1.68* 1.72* 1.89* 2.04* 1.83*  -Recheck renal function in the morning   Health Care  associated pneumonia:  -Completed 7 days of antibiotics   Agitation-likely ICU delirium.  Seems to have resolved. -Continue to wean down clonazepam and and Seroquel gradually   Normocytic  anemia: H&H relatively stable.  Anemia panel basically normal. Recent Labs    10/25/21 0415 10/27/21 0114 10/28/21 0059 10/30/21 0015 11/01/21 0030 11/03/21 0057 11/04/21 0139 11/06/21 0116 11/07/21 0102 11/08/21 0125  HGB 7.8* 8.1* 8.0* 8.0* 8.1* 8.2* 8.3* 8.4* 8.2* 8.3*  -Continue monitoring   Hypomagnesemia and hypophosphatemia: Resolved.   Dysphagia/nutrition: Resolved. -Continue regular diet per SLP  Increased nutrient needs -Continue multivitamins, supplements -Appreciate input by dietitian   DVT prophylaxis:  heparin injection 5,000 Units Start: 10/15/21 1400 SCDs Start: 09/20/21 0831  Code Status: Partial code with NIPPV/intubation/ACLS meds but no CPR or DCCV. Family Communication: Patient and/or RN. Available if any question.  Level of care: Progressive Status is: Inpatient  Remains inpatient appropriate because: Safe disposition with tracheostomy  Final disposition-TBD  Consultants:  Pulmonology Cardiology   Sch Meds:  Scheduled Meds:  amLODipine  10 mg Oral Daily   arformoterol  15 mcg Nebulization BID   aspirin  81 mg Oral Daily   atorvastatin  20 mg Oral Daily   budesonide (PULMICORT) nebulizer solution  0.25 mg Nebulization BID   carvedilol  25 mg Oral BID   chlorhexidine  15 mL Mouth Rinse BID   clonazePAM  0.5 mg Oral BID   DULoxetine  60 mg Oral Daily   feeding supplement (GLUCERNA SHAKE)  237 mL Oral BID BM   ferrous sulfate  325 mg Oral Q breakfast   fluticasone  2 spray Each Nare Daily   guaiFENesin  1,200 mg Oral BID   heparin injection (subcutaneous)  5,000 Units Subcutaneous Q8H   hydrALAZINE  100 mg Oral Q8H   insulin aspart  0-15 Units Subcutaneous TID WC   insulin aspart  0-5 Units Subcutaneous QHS   insulin aspart  6 Units Subcutaneous  TID WC   insulin detemir  27 Units Subcutaneous BID   isosorbide mononitrate  30 mg Oral Daily   lidocaine  2 patch Transdermal Q24H   loratadine  10 mg Oral Daily   mouth rinse  15 mL Mouth Rinse q12n4p   metoCLOPramide  5 mg Oral TID AC   montelukast  10 mg Oral QHS   multivitamin with minerals  1 tablet Oral Daily   pantoprazole  40 mg Oral BID   QUEtiapine  50 mg Oral BID   sodium chloride flush  10-40 mL Intracatheter Q12H   Continuous Infusions:  sodium chloride Stopped (10/21/21 0722)   PRN Meds:.sodium chloride, acetaminophen, albuterol, dextrose, docusate sodium, guaiFENesin, hydrALAZINE, nitroGLYCERIN, ondansetron (ZOFRAN) IV, phenol, polyethylene glycol, sodium chloride  Antimicrobials: Anti-infectives (From admission, onward)    Start     Dose/Rate Route Frequency Ordered Stop   10/18/21 0830  ceFEPIme (MAXIPIME) 2 g in sodium chloride 0.9 % 100 mL IVPB  Status:  Discontinued        2 g 200 mL/hr over 30 Minutes Intravenous Every 24 hours 10/17/21 1055 10/17/21 1056   10/17/21 2200  ceFEPIme (MAXIPIME) 2 g in sodium chloride 0.9 % 100 mL IVPB  2 g 200 mL/hr over 30 Minutes Intravenous Every 12 hours 10/17/21 1056 10/18/21 2145   10/14/21 0830  ceFEPIme (MAXIPIME) 2 g in sodium chloride 0.9 % 100 mL IVPB  Status:  Discontinued        2 g 200 mL/hr over 30 Minutes Intravenous Every 24 hours 10/14/21 0721 10/17/21 1055   10/14/21 0215  piperacillin-tazobactam (ZOSYN) IVPB 3.375 g        3.375 g 100 mL/hr over 30 Minutes Intravenous  Once 10/14/21 0126 10/14/21 0302   10/04/21 1100  vancomycin (VANCOREADY) IVPB 1250 mg/250 mL  Status:  Discontinued        1,250 mg 166.7 mL/hr over 90 Minutes Intravenous Every 48 hours 10/02/21 0939 10/03/21 1508   10/02/21 2200  ceFEPIme (MAXIPIME) 2 g in sodium chloride 0.9 % 100 mL IVPB  Status:  Discontinued        2 g 200 mL/hr over 30 Minutes Intravenous Every 12 hours 10/02/21 0948 10/04/21 1010   10/02/21 1030  vancomycin  (VANCOREADY) IVPB 2000 mg/400 mL        2,000 mg 200 mL/hr over 120 Minutes Intravenous  Once 10/02/21 0939 10/02/21 1324   09/27/21 0900  ceFEPIme (MAXIPIME) 2 g in sodium chloride 0.9 % 100 mL IVPB  Status:  Discontinued        2 g 200 mL/hr over 30 Minutes Intravenous Every 12 hours 09/27/21 0800 10/02/21 0948   09/27/21 0900  vancomycin (VANCOREADY) IVPB 1500 mg/300 mL  Status:  Discontinued        1,500 mg 150 mL/hr over 120 Minutes Intravenous Every 48 hours 09/27/21 0800 09/28/21 1058   09/21/21 0800  cefTRIAXone (ROCEPHIN) 2 g in sodium chloride 0.9 % 100 mL IVPB        2 g 200 mL/hr over 30 Minutes Intravenous Every 24 hours 09/21/21 0742 09/25/21 0808   09/20/21 0945  cefTRIAXone (ROCEPHIN) 2 g in sodium chloride 0.9 % 100 mL IVPB  Status:  Discontinued        2 g 200 mL/hr over 30 Minutes Intravenous Every 24 hours 09/20/21 0936 09/21/21 0742        I have personally reviewed the following labs and images: CBC: Recent Labs  Lab 11/03/21 0057 11/04/21 0139 11/06/21 0116 11/07/21 0102 11/08/21 0125  WBC 9.6  --   --   --   --   HGB 8.2* 8.3* 8.4* 8.2* 8.3*  HCT 27.6* 27.2* 27.0* 27.3* 27.3*  MCV 96.2  --   --   --   --   PLT 260  --   --   --   --    BMP &GFR Recent Labs  Lab 11/03/21 0057 11/04/21 0139 11/06/21 0116 11/07/21 0102 11/08/21 0125  NA 139 137 136 138 139  K 4.2 4.4 4.3 4.2 4.1  CL 102 104 101 103 105  CO2 30 24 28 28 27   GLUCOSE 147* 165* 189* 157* 151*  BUN 22* 24* 29* 30* 29*  CREATININE 1.68* 1.72* 1.89* 2.04* 1.83*  CALCIUM 9.9 9.5 9.5 9.6 9.7  MG  --  1.7 1.7 2.2 2.0  PHOS  --  3.8 3.2 4.1 4.0   Estimated Creatinine Clearance: 42.6 mL/min (A) (by C-G formula based on SCr of 1.83 mg/dL (H)). Liver & Pancreas: Recent Labs  Lab 11/04/21 0139 11/06/21 0116 11/07/21 0102 11/08/21 0125  ALBUMIN 2.3* 2.5* 2.5* 2.5*   No results for input(s): LIPASE, AMYLASE in the last 168 hours. No results  for input(s): AMMONIA in the last 168  hours. Diabetic: No results for input(s): HGBA1C in the last 72 hours. Recent Labs  Lab 11/07/21 1643 11/07/21 2027 11/08/21 0748 11/08/21 1125 11/08/21 1548  GLUCAP 113* 120* 128* 204* 141*   Cardiac Enzymes: No results for input(s): CKTOTAL, CKMB, CKMBINDEX, TROPONINI in the last 168 hours. No results for input(s): PROBNP in the last 8760 hours. Coagulation Profile: No results for input(s): INR, PROTIME in the last 168 hours. Thyroid Function Tests: No results for input(s): TSH, T4TOTAL, FREET4, T3FREE, THYROIDAB in the last 72 hours. Lipid Profile: No results for input(s): CHOL, HDL, LDLCALC, TRIG, CHOLHDL, LDLDIRECT in the last 72 hours. Anemia Panel: No results for input(s): VITAMINB12, FOLATE, FERRITIN, TIBC, IRON, RETICCTPCT in the last 72 hours. Urine analysis:    Component Value Date/Time   COLORURINE YELLOW 10/14/2021 1422   APPEARANCEUR CLOUDY (A) 10/14/2021 1422   LABSPEC 1.016 10/14/2021 1422   PHURINE 5.0 10/14/2021 1422   GLUCOSEU 50 (A) 10/14/2021 1422   HGBUR SMALL (A) 10/14/2021 1422   BILIRUBINUR NEGATIVE 10/14/2021 1422   KETONESUR NEGATIVE 10/14/2021 1422   PROTEINUR 100 (A) 10/14/2021 1422   NITRITE NEGATIVE 10/14/2021 1422   LEUKOCYTESUR NEGATIVE 10/14/2021 1422   Sepsis Labs: Invalid input(s): PROCALCITONIN, [place]  Microbiology: No results found for this or any previous visit (from the past 240 hour(s)).  Radiology Studies: No results found.    Maureen Jones T. Garrison  If 7PM-7AM, please contact night-coverage [email or web address] 11/08/2021, 3:59 PM

## 2021-11-08 NOTE — Progress Notes (Signed)
Okay to wait four weeks for routine trach changes per Laurey Arrow, NP with CCM. Next change will be due on 11/22/21.

## 2021-11-08 NOTE — Progress Notes (Signed)
NAMETinika Jones, MRN:  [id number], DOB:  Jul 07, 1966, LOS: 36 ADMISSION DATE:  09/20/2021 CONSULTATION DATE: 09/20/2021 REFERRING MD:  Dr. Laverta Baltimore CHIEF COMPLAINT:  AMS   History of Present Illness:  55 year old woman who presented to [facility] on 10/10 with AMS secondary to hypertensive emergency with hypoxic respiratory failure related to VCD/post intubation subglottic and glottic edema/vocal hoarseness, +/- asthmatic exacerbation.  Ultimately required tracheostomy placement 10/25.   Transferred back to ICU on 11/2 after trach became dislodged on floor, resulting in PEA arrest with CPR x 10 min and 2mg  epi. ROSC obtained. Trach revised to 6.0 XLT Distal 11/2, then to 6.0 XLT Proximal 11/5.  Remains stable from a tracheostomy standpoint.  Pertinent Medical History:  HTN, T2DM   Significant Hospital Events: Including procedures, antibiotic start and stop dates in addition to other pertinent events   10/10 Admit with acute encephalopathy, hypertensive 10/12 extubated, increased WOB, re-intubated 10/14 extubated , lingering stridor, Received racemic epi x2 over the last 24 hours + another dose of Decadron, Mild agitation requiring restart of Precedex 10/15 swallow evaluation >> dysphagia 3 diet 10/20 decompensated after switch to PO steroids 10/21 better after CAT and back on IV steroids 10/22 agitated back on  BIPAP 10/25 stridor again. Marked work of breathing. Intubated, airway noises, stridor and wheezing completely resolved post intubation. Spoke to ENT who will follow after trach. Trach placed by Dr Vaughan Browner. Seroquel increased to 100 qhs, changes solumedrol to prednisone to begin taper, rested on propofol over night. Tubefeeds started. RUE PICC placed  10/26 prop weaning off attempting ATC  11/2 PEA arrest in setting of dislodged tracheostomy. questionable syncope.  Total 10 minutes CPR. Transferred to intensive care unit intubated via trach stoma for redo trach 11/2/202.  Redo  tracheostomy. 6 XLT. 11/4 Concern overnight for STEMI, hep gtt started, cards consulted; this was artifact 11/5 changed out distal XLT for proximal due to positioning within trachea 11/8 tolerated trach collar for about 6 hours yesterday 11/7, placed back on vent  11/10 did not require vent overnight  11/14 Stable from tracheostomy standpoint, remains on ATC 8L FiO2 35%. Ongoing TOC discussion re: placement, not a good candidate for CIR due to unavailable 24H care post-CIR discharge. 11/02/2021 not ready for decannulation  Interim History / Subjective:  Resting comfortably   Objective:  Blood pressure 125/67, pulse 82, temperature 98.3 F (36.8 C), temperature source Oral, resp. rate 18, height 5\' 6"  (1.676 m), weight 102.7 kg, SpO2 100 %.    FiO2 (%):  [35 %-40 %] 40 %   Intake/Output Summary (Last 24 hours) at 11/08/2021 1056 Last data filed at 11/08/2021 0704 Gross per 24 hour  Intake 120 ml  Output 650 ml  Net -530 ml   Filed Weights   11/06/21 0500 11/07/21 0500 11/08/21 0500  Weight: 103.8 kg 102.6 kg 102.7 kg   Physical Examination: General this is a 54 year old female she is resting in bed. Not in distress.  HENT 5 prox trach. Good phonation w/ PMV in place. Does still have rush of air after removal  Pulm some scattered rhonchi. Upper airway wheeze w/ PMV in place Card RRR Abd soft not tender Ext warm and dry  Neuro intact   Resolved Hospital Problem List:   RML/RLL PNA (treated) Hypertensive emergency  Status post tracheostomy 10/25. Tracheostomy dislodgment 11/2 led to PEA arrest; required CPR x 10 minutes Assessment & Plan:   Acute hypoxemic respiratory failure history of vocal cord dysfunction Subglottic  and glottic edema. Changed to 6.0 Proximal XLT 11/5.  Changed to uncuffed proximal 5 XLT Shiley 10/25/2021   Voice quality improved after changing trach to 5 from 6 BUT still has evidence of airway obstruction. She is not a candidate for decannulation at  this point.   Plan Cont routine trach care Have ordered trach care team and nursing to start trach teaching w/ patient and family  She needs to do walking oximetry with PMV OFF.  Once she is comfortable she can go home I have ordered her DME supplies and will set her up to see me in my clinic.  Depending on how she does she and I can discuss ENT referral   Erick Colace ACNP-BC [place] Pager # [phone] OR # [phone] if no answer

## 2021-11-08 NOTE — Progress Notes (Signed)
Physical Therapy Treatment Patient Details Name: Maureen Jones MRN: [id number] DOB: [birth year] Today's Date: 11/08/2021   History of Present Illness 55 y.o. female admitted 09/20/21 with AMS. 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 CXR with low lung volumes, L>R interstitial opacities. Acute metabolic encephalopathy in setting of HTN urgency, possible PRES. ETT (10/10-10/12, 10/12-10/14). Course complicated by confusion/agitation overnight 10/17. 10/25 trach and bronch with return to vent.  11/2 pt passed out while returning from the bathroom pt with respiratory distress from disloged trach leading to PEA arrest with CPR x 10 min. ETT via stoma.  Trach revised 11/2.  PMHx:HTN, DM.    PT Comments    Upon entry, pt had ambulated a block around the unit with NT. Pt agreeable to perform standing exercises for strengthening and endurance (see below). SpO2 90-98% on 35% oxygen. D/c plan remains appropriate.  Recommendations for follow up therapy are one component of a multi-disciplinary discharge planning process, led by the attending physician.  Recommendations may be updated based on patient status, additional functional criteria and insurance authorization.  Follow Up Recommendations  Home health PT     Assistance Recommended at Discharge Intermittent Supervision/Assistance  Equipment Recommendations  Rolling walker (2 wheels)    Recommendations for Other Services       Precautions / Restrictions Precautions Precautions: Fall;Other (comment) Precaution Comments: trach Restrictions Weight Bearing Restrictions: No     Mobility  Bed Mobility Overal bed mobility: Modified Independent                  Transfers Overall transfer level: Independent Equipment used: None                    Ambulation/Gait Ambulation/Gait assistance: Supervision Gait Distance (Feet): 3 Feet Assistive device: None         General Gait Details: Received standing at edge of bed  with NT   Stairs             Wheelchair Mobility    Modified Rankin (Stroke Patients Only)       Balance Overall balance assessment: Needs assistance Sitting-balance support: Feet supported Sitting balance-[name]: Good     Standing balance support: No upper extremity supported;During functional activity Standing balance-[name]: Fair                              Cognition Arousal/Alertness: Awake/alert Behavior During Therapy: WFL for tasks assessed/performed Overall Cognitive Status: Difficult to assess                                          Exercises General Exercises - Lower Extremity Hip ABduction/ADduction: Both;10 reps;Standing Heel Raises: Both;20 reps;Standing Mini-Sqauts: Both;15 reps;Standing Other Exercises Other Exercises: Standing: bilateral hamstring curls x 10 each, hip extension x 10 each    General Comments        Pertinent Vitals/Pain Pain Assessment: Faces Faces Pain Scale: No hurt    Home Living                          Prior Function            PT Goals (current goals can now be found in the care plan section) Acute Rehab PT Goals PT Goal Formulation: (P) With patient Time For Goal Achievement: (P) 11/22/21  Potential to Achieve Goals: (P) Good Progress towards PT goals: (P) Progressing toward goals    Frequency    Min 3X/week      PT Plan Current plan remains appropriate    Co-evaluation              AM-PAC PT "6 Clicks" Mobility   Outcome Measure  Help needed turning from your back to your side while in a flat bed without using bedrails?: (P) None Help needed moving from lying on your back to sitting on the side of a flat bed without using bedrails?: (P) None Help needed moving to and from a bed to a chair (including a wheelchair)?: (P) None Help needed standing up from a chair using your arms (e.g., wheelchair or bedside chair)?: (P) None Help needed to  walk in hospital room?: (P) A Little Help needed climbing 3-5 steps with a railing? : (P) A Little 6 Click Score: (P) 22    End of Session Equipment Utilized During Treatment: Oxygen Activity Tolerance: Patient tolerated treatment well Patient left: in bed;with call bell/phone within reach;with bed alarm set Nurse Communication: Mobility status PT Visit Diagnosis: Other abnormalities of gait and mobility (R26.89);Difficulty in walking, not elsewhere classified (R26.2);Muscle weakness (generalized) (M62.81)     Time: (P) 1518-(P) 1536 PT Time Calculation (min) (ACUTE ONLY): (P) 18 min  Charges:  $Therapeutic Exercise: (P) 8-22 mins                     Wyona Almas, PT, DPT Acute Rehabilitation Services Pager [phone] Office [phone]    Deno Etienne 11/08/2021, 5:13 PM

## 2021-11-09 DIAGNOSIS — J384 Edema of larynx: Secondary | ICD-10-CM | POA: Diagnosis not present

## 2021-11-09 DIAGNOSIS — J383 Other diseases of vocal cords: Secondary | ICD-10-CM | POA: Diagnosis not present

## 2021-11-09 DIAGNOSIS — J9601 Acute respiratory failure with hypoxia: Secondary | ICD-10-CM | POA: Diagnosis not present

## 2021-11-09 DIAGNOSIS — Z930 Tracheostomy status: Secondary | ICD-10-CM | POA: Diagnosis not present

## 2021-11-09 LAB — RENAL FUNCTION PANEL
Albumin: 2.6 g/dL — ABNORMAL LOW (ref 3.5–5.0)
Anion gap: 7 (ref 5–15)
BUN: 28 mg/dL — ABNORMAL HIGH (ref 6–20)
CO2: 29 mmol/L (ref 22–32)
Calcium: 9.9 mg/dL (ref 8.9–10.3)
Chloride: 103 mmol/L (ref 98–111)
Creatinine, Ser: 2.02 mg/dL — ABNORMAL HIGH (ref 0.44–1.00)
GFR, Estimated: 29 mL/min — ABNORMAL LOW (ref >60)
Glucose, Bld: 176 mg/dL — ABNORMAL HIGH (ref 70–99)
Phosphorus: 3.8 mg/dL (ref 2.5–4.6)
Potassium: 4.1 mmol/L (ref 3.5–5.1)
Sodium: 139 mmol/L (ref 135–145)

## 2021-11-09 LAB — HEMOGLOBIN AND HEMATOCRIT, BLOOD
HCT: 27.3 % — ABNORMAL LOW (ref 36.0–46.0)
Hemoglobin: 8.2 g/dL — ABNORMAL LOW (ref 12.0–15.0)

## 2021-11-09 LAB — GLUCOSE, CAPILLARY
Glucose-Capillary: 136 mg/dL — ABNORMAL HIGH (ref 70–99)
Glucose-Capillary: 213 mg/dL — ABNORMAL HIGH (ref 70–99)
Glucose-Capillary: 99 mg/dL (ref 70–99)
Glucose-Capillary: 209 mg/dL — ABNORMAL HIGH (ref 70–99)

## 2021-11-09 LAB — MAGNESIUM: Magnesium: 1.9 mg/dL (ref 1.7–2.4)

## 2021-11-09 MED ORDER — QUETIAPINE FUMARATE 25 MG PO TABS
25.0000 mg | ORAL_TABLET | Freq: Two times a day (BID) | ORAL | Status: DC
  Administered 2021-11-09 – 2021-11-15 (×12): 25 mg via ORAL
  Filled 2021-11-09 (×11): qty 1

## 2021-11-09 NOTE — TOC Progression Note (Addendum)
Transition of Care [facility]) - Progression Note    Patient Details  Name: Chiniqua Kilcrease MRN: [id number] Date of Birth: [birth year]  Transition of [facility] CM/SW Contact  Carles Collet, RN Phone Number: 11/09/2021, 4:04 PM  Clinical Narrative:   spoke with [name] over the phone. Verified that the patient will return home to address listed, where she lives with [name]. [name] states that she had trach teaching last Monday and it went well. She states that she is going to come to the hospital on Friday for trach teaching. Encouraged her to come as much as possible.  DME Needs- trach supplies need ordered, as well as hospital bed and 3/1.  HH- Attempted to set up [facility]- unable to accept trach patients [facility]- declined [facility]- declined [facility]- declined [facility]- no answer [facility]- declined [facility]- unable to accept trach patients [facility]- faxed referral for their review     Peter Minium   [phone]     Expected Discharge Plan: [place] Barriers to Discharge: Continued Medical Work up  Expected Discharge Plan and Services Expected Discharge Plan: [place]   Discharge Planning Services: CM Consult Post Acute Care Choice: [place] arrangements for the past 2 months: Mobile Home                 DME Arranged: 3-N-1, [place] rolling DME Agency: [facility] Date DME Agency Contacted: 11/01/21 Time DME Agency Contacted: 36 Representative spoke with at DME Agency: [place]: RN, PT, OT, Speech Therapy [place] Agency: [place] Date [place]: 09/29/21 Time [place]: 1620 Representative spoke with at [place]: [place] ([place]) Interventions    Readmission Risk Interventions No flowsheet data found.

## 2021-11-09 NOTE — TOC Progression Note (Signed)
Transition of Care [facility]) - Progression Note    Patient Details  Name: Jesica Goheen MRN: [id number] Date of Birth: Jan 07, 1966  Transition of Care [facility]) CM/SW Contact  [name], [name], RN Phone Number: 11/09/2021, 4:43 PM  Clinical Narrative:    CM received a secure chat message from another CM that patient's friend [name] is stating that I, CM told her that patient will get help in the house 3 days a week. CM called [name] to verify and clarify information. CM reiterated to [name] that if patient is going home and home health is recommended, PT/OT will come to the house 2-3 days a week and they will not be there for 24 hours. Someone from their office will call to schedule appointment. If Aide is also recommended, they will only be there no longer than an hour. [name] states that's what she told the CM she spoke with today. [name] states she understood. CM will continue to follow with needs.    Expected Discharge Plan: [place] Barriers to Discharge: Continued Medical Work up  Expected Discharge Plan and Services Expected Discharge Plan: [place]   Discharge Planning Services: CM Consult Post Acute Care Choice: [place] arrangements for the past 2 months: Mobile Home                 DME Arranged: 3-N-1, [place] rolling DME Agency: [facility] Date DME Agency Contacted: 11/01/21 Time DME Agency Contacted: 1350 Representative spoke with at DME Agency: [place] Arranged: RN, PT, OT, Speech Therapy [place] Agency: [place] Date [place]: 09/29/21 Time [place]: 1620 Representative spoke with at [place]: [place] ([place]) Interventions    Readmission Risk Interventions No flowsheet data found.

## 2021-11-09 NOTE — Progress Notes (Signed)
Nutrition Follow-up  DOCUMENTATION CODES:   Obesity unspecified  INTERVENTION:   Continue Multivitamin w/ minerals daily Discontinue Glucerna Shake Encourage good PO intake   NUTRITION DIAGNOSIS:   Inadequate oral intake related to inability to eat as evidenced by NPO status. - Progressing, pt now on diet  GOAL:   Patient will meet greater than or equal to 90% of their needs - Progressing  MONITOR:   PO intake, Labs  REASON FOR ASSESSMENT:   Ventilator, Consult Enteral/tube feeding initiation and management  ASSESSMENT:   Pt with PMH of DM, HTN, and smoking admitted with acute encephalopathy.  10/10 - admit, intubated 10/12 - extubated and re-intubated 10/14 - extubated  10/15 - dysphagia 3 diet s/p swallow eval 10/22 - returned to BiPAP 10/25 - re-intubated, trach 10/26 - Cortrak placed (gastric tip confirmed via x-ray) 11/02 - Code Blue due to malpositioning of trach, s/p re-do trach 11/03 - NG tube placed (tip in stomach) 11/04 - Cortrak placed (tip in stomach) 11/08 - MBS, diet advanced to carb modified with nectar-thick liquids, Cortrak dislodged d/t N/V and removed 11/16 - diet advanced to Carb Modified w/ thin liquids  Pt reports that she is doing good, that her appetite is back. Pt denies any nausea or vomiting.  Per EMR, pt intake includes: 11/24: Breakfast 100% 11/25: Breakfast 100%, Lunch 100% 11/26: Breakfast 100%  Pt reports that she has not been drinking the Glucerna shake due to making her go to the bathroom. Will discontinue, PLDN to follow for needs of additional supplementation if PO intake is inadequate.  Medications reviewed and include: Klonopin, Ferrous Sulfate, SSI 0-15 units TID + 0-5 units daily + 6 units TID, Levemir 27 units, Reglan, MVI, Protonix Labs reviewed: 24 hr BG trends 128-204  Diet Order:   Diet Order             Diet Carb Modified Fluid consistency: Thin; Room service appropriate? Yes with Assist  Diet effective now                    EDUCATION NEEDS:   No education needs have been identified at this time  Skin:  Skin Assessment: Reviewed RN Assessment  Last BM:  11/08/2021  Height:   Ht Readings from Last 1 Encounters:  10/05/21 5\' 6"  (1.676 m)    Weight:   Wt Readings from Last 1 Encounters:  11/09/21 105.5 kg    Ideal Body Weight:  59.1 kg  BMI:  Body mass index is 37.54 kg/m.  Estimated Nutritional Needs:   Kcal:  1900-2100  Protein:  95-105g  Fluid:  >1.9L    Hermina Barters BS, PLDN Clinical Dietitian See AMiON for contact information.

## 2021-11-09 NOTE — Progress Notes (Signed)
PROGRESS NOTE  Maureen Jones GGE:[id number] DOB: 1966/03/27   PCP: Pcp, No  Patient is from: Home  DOA: 09/20/2021 LOS: 65  Chief complaints:  Chief Complaint  Patient presents with   Code Stroke     Brief Narrative / Interim history: 55 year old F with PMH of DM-2, CKD-3B, HTN and asthma presented [facility]  on 10/10 with AMS, hypertensive emergency and acute hypoxic respiratory failure due to VCD/supraglottic and glottic edema with possible asthma exacerbation.  She was extubated on 10/12 but reintubated due to increased work of breathing.  She was extubated again on 10/14 but had to reintubated on 10/25 due to stridor and marked work of breathing.  Eventually, had tracheostomy.  Patient had PEA arrest after dislodged tracheostomy requiring 10 minutes of CPR and 2 mg of epi before ROSC on 11/2.  She had redo tracheostomy with 6 XLT.  She was started on trach collar on 11/8, and tracheostomy downsized to 5 uncuffed proximal XLT Shiley on 11/14.  PCCM following for trach.   Subjective: Seen and examined earlier this morning.  No major events overnight of this morning.  No complaints.  She asked me to call [name], "sister in [place]" to see if she can come by for trach care teaching.  She denies chest pain, dyspnea, GI or UTI symptoms.  Objective: Vitals:   11/09/21 0429 11/09/21 0500 11/09/21 0735 11/09/21 1142  BP: 139/63  (!) 147/65 131/71  Pulse: 80  81 82  Resp: (!) 22  16 18   Temp: 98 F (36.7 C)  98.2 F (36.8 C) 98.2 F (36.8 C)  TempSrc: Oral  Oral Oral  SpO2: 99%  90% 97%  Weight:  105.5 kg    Height:        Intake/Output Summary (Last 24 hours) at 11/09/2021 1508 Last data filed at 11/09/2021 0000 Gross per 24 hour  Intake 180 ml  Output --  Net 180 ml   Filed Weights   11/07/21 0500 11/08/21 0500 11/09/21 0500  Weight: 102.6 kg 102.7 kg 105.5 kg    Examination:  GENERAL: No apparent distress.  Nontoxic. HEENT: MMM.  Vision and hearing grossly intact.   NECK: Tracheostomy in place. RESP: 97% on 3 L / 40% FiO2.  No IWOB.  Fair aeration bilaterally. CVS:  RRR. Heart sounds normal.  ABD/GI/GU: BS+. Abd soft, NTND.  MSK/EXT:  Moves extremities. No apparent deformity. No edema.  SKIN: no apparent skin lesion or wound NEURO: Awake and alert. Oriented appropriately.  No apparent focal neuro deficit. PSYCH: Calm. Normal affect.   Procedures:  See above  Microbiology summarized: 10/10-COVID-19 and influenza PCR nonreactive. 10/10, 10/17 and 11/3-MRSA PCR nonreactive. 10/10-blood cultures NGTD. 10/14, 30/81, 10/28, 11/3 and 11/5-respiratory culture with normal respiratory flora.  Assessment & Plan: Acute respiratory failure with hypoxia due to vocal cord dysfunction/supraglottic and glottic edema and possible asthma exacerbation -S/p multiple intubations and extubations -Trach on 10/25. Downsized to #5 uncuffed trach.  Tolerating PMV.  Weaning oxygen. -Continue Brovana, Pulmicort and as needed DuoNeb. -Tracheostomy care per PCCM, RT and RN. -Called and talked to [name], who can't be here until 12/1 for trach teaching.  Uncontrolled DM-2 with hyperglycemia, gastroparesis and other complication: Y6T 03%. Recent Labs  Lab 11/08/21 1125 11/08/21 1548 11/08/21 2007 11/09/21 0740 11/09/21 1155  GLUCAP 204* 141* 161* 136* 213*  -Continue SSI-moderate -Continue Levemir 27 units twice daily. -Continue Lovenox 6 units 3 times daily with meals -Continue Reglan -Further adjustment as appropriate -Continue atorvastatin.  PEA arrest-in  the setting of dislodged tracheostomy-ROSC after 10 minutes of CPR and 2 mg of epi. -Optimize electrolytes -Continue telemetry monitoring  Uncontrolled hypertension: Normotensive today. -Continue with Norvasc, hydralazine, Imdur and Coreg  AKI on CKD-3B/azotemia: Cr seems to be stable from 1.7-2.0. Recent Labs    10/27/21 0114 10/28/21 0059 10/30/21 0015 11/01/21 0030 11/03/21 0057 11/04/21 0139  11/06/21 0116 11/07/21 0102 11/08/21 0125 11/09/21 0351  BUN 33* 33* 29* 26* 22* 24* 29* 30* 29* 28*  CREATININE 2.03* 1.92* 1.81* 1.81* 1.68* 1.72* 1.89* 2.04* 1.83* 2.02*  -Recheck renal function intermittently.   Health Care associated pneumonia:  -Completed 7 days of antibiotics   Agitation-likely ICU delirium.  Seems to have resolved. -Continue to wean down clonazepam and and Seroquel gradually   Normocytic  anemia: H&H relatively stable.  Anemia panel basically normal. Recent Labs    10/27/21 0114 10/28/21 0059 10/30/21 0015 11/01/21 0030 11/03/21 0057 11/04/21 0139 11/06/21 0116 11/07/21 0102 11/08/21 0125 11/09/21 0351  HGB 8.1* 8.0* 8.0* 8.1* 8.2* 8.3* 8.4* 8.2* 8.3* 8.2*  -Continue monitoring   Hypomagnesemia and hypophosphatemia: Resolved.   Dysphagia/nutrition: Resolved. -Continue regular diet per SLP  Increased nutrient needs -Continue multivitamins, supplements -Appreciate input by dietitian   DVT prophylaxis:  heparin injection 5,000 Units Start: 10/15/21 1400 SCDs Start: 09/20/21 0831  Code Status: Partial code with NIPPV/intubation/ACLS meds but no CPR or DCCV. Family Communication: Called and talked to patient's "sister" over the phone. Level of care: Progressive Status is: Inpatient  Remains inpatient appropriate because: Safe disposition with tracheostomy  Final disposition-TBD  Consultants:  Pulmonology Cardiology   Sch Meds:  Scheduled Meds:  amLODipine  10 mg Oral Daily   arformoterol  15 mcg Nebulization BID   aspirin  81 mg Oral Daily   atorvastatin  20 mg Oral Daily   budesonide (PULMICORT) nebulizer solution  0.25 mg Nebulization BID   carvedilol  25 mg Oral BID   chlorhexidine  15 mL Mouth Rinse BID   clonazePAM  0.5 mg Oral BID   DULoxetine  60 mg Oral Daily   ferrous sulfate  325 mg Oral Q breakfast   fluticasone  2 spray Each Nare Daily   guaiFENesin  1,200 mg Oral BID   heparin injection (subcutaneous)  5,000  Units Subcutaneous Q8H   hydrALAZINE  100 mg Oral Q8H   insulin aspart  0-15 Units Subcutaneous TID WC   insulin aspart  0-5 Units Subcutaneous QHS   insulin aspart  6 Units Subcutaneous TID WC   insulin detemir  27 Units Subcutaneous BID   isosorbide mononitrate  30 mg Oral Daily   lidocaine  2 patch Transdermal Q24H   loratadine  10 mg Oral Daily   mouth rinse  15 mL Mouth Rinse q12n4p   metoCLOPramide  5 mg Oral TID AC   montelukast  10 mg Oral QHS   multivitamin with minerals  1 tablet Oral Daily   pantoprazole  40 mg Oral BID   QUEtiapine  50 mg Oral BID   sodium chloride flush  10-40 mL Intracatheter Q12H   Continuous Infusions:  sodium chloride Stopped (10/21/21 0722)   PRN Meds:.sodium chloride, acetaminophen, albuterol, dextrose, docusate sodium, guaiFENesin, hydrALAZINE, nitroGLYCERIN, ondansetron (ZOFRAN) IV, phenol, polyethylene glycol, sodium chloride  Antimicrobials: Anti-infectives (From admission, onward)    Start     Dose/Rate Route Frequency Ordered Stop   10/18/21 0830  ceFEPIme (MAXIPIME) 2 g in sodium chloride 0.9 % 100 mL IVPB  Status:  Discontinued  2 g 200 mL/hr over 30 Minutes Intravenous Every 24 hours 10/17/21 1055 10/17/21 1056   10/17/21 2200  ceFEPIme (MAXIPIME) 2 g in sodium chloride 0.9 % 100 mL IVPB        2 g 200 mL/hr over 30 Minutes Intravenous Every 12 hours 10/17/21 1056 10/18/21 2145   10/14/21 0830  ceFEPIme (MAXIPIME) 2 g in sodium chloride 0.9 % 100 mL IVPB  Status:  Discontinued        2 g 200 mL/hr over 30 Minutes Intravenous Every 24 hours 10/14/21 0721 10/17/21 1055   10/14/21 0215  piperacillin-tazobactam (ZOSYN) IVPB 3.375 g        3.375 g 100 mL/hr over 30 Minutes Intravenous  Once 10/14/21 0126 10/14/21 0302   10/04/21 1100  vancomycin (VANCOREADY) IVPB 1250 mg/250 mL  Status:  Discontinued        1,250 mg 166.7 mL/hr over 90 Minutes Intravenous Every 48 hours 10/02/21 0939 10/03/21 1508   10/02/21 2200  ceFEPIme  (MAXIPIME) 2 g in sodium chloride 0.9 % 100 mL IVPB  Status:  Discontinued        2 g 200 mL/hr over 30 Minutes Intravenous Every 12 hours 10/02/21 0948 10/04/21 1010   10/02/21 1030  vancomycin (VANCOREADY) IVPB 2000 mg/400 mL        2,000 mg 200 mL/hr over 120 Minutes Intravenous  Once 10/02/21 0939 10/02/21 1324   09/27/21 0900  ceFEPIme (MAXIPIME) 2 g in sodium chloride 0.9 % 100 mL IVPB  Status:  Discontinued        2 g 200 mL/hr over 30 Minutes Intravenous Every 12 hours 09/27/21 0800 10/02/21 0948   09/27/21 0900  vancomycin (VANCOREADY) IVPB 1500 mg/300 mL  Status:  Discontinued        1,500 mg 150 mL/hr over 120 Minutes Intravenous Every 48 hours 09/27/21 0800 09/28/21 1058   09/21/21 0800  cefTRIAXone (ROCEPHIN) 2 g in sodium chloride 0.9 % 100 mL IVPB        2 g 200 mL/hr over 30 Minutes Intravenous Every 24 hours 09/21/21 0742 09/25/21 0808   09/20/21 0945  cefTRIAXone (ROCEPHIN) 2 g in sodium chloride 0.9 % 100 mL IVPB  Status:  Discontinued        2 g 200 mL/hr over 30 Minutes Intravenous Every 24 hours 09/20/21 0936 09/21/21 0742        I have personally reviewed the following labs and images: CBC: Recent Labs  Lab 11/03/21 0057 11/04/21 0139 11/06/21 0116 11/07/21 0102 11/08/21 0125 11/09/21 0351  WBC 9.6  --   --   --   --   --   HGB 8.2* 8.3* 8.4* 8.2* 8.3* 8.2*  HCT 27.6* 27.2* 27.0* 27.3* 27.3* 27.3*  MCV 96.2  --   --   --   --   --   PLT 260  --   --   --   --   --    BMP &GFR Recent Labs  Lab 11/04/21 0139 11/06/21 0116 11/07/21 0102 11/08/21 0125 11/09/21 0351  NA 137 136 138 139 139  K 4.4 4.3 4.2 4.1 4.1  CL 104 101 103 105 103  CO2 24 28 28 27 29   GLUCOSE 165* 189* 157* 151* 176*  BUN 24* 29* 30* 29* 28*  CREATININE 1.72* 1.89* 2.04* 1.83* 2.02*  CALCIUM 9.5 9.5 9.6 9.7 9.9  MG 1.7 1.7 2.2 2.0 1.9  PHOS 3.8 3.2 4.1 4.0 3.8   Estimated Creatinine Clearance: 39.1  mL/min (A) (by C-G formula based on SCr of 2.02 mg/dL (H)). Liver &  Pancreas: Recent Labs  Lab 11/04/21 0139 11/06/21 0116 11/07/21 0102 11/08/21 0125 11/09/21 0351  ALBUMIN 2.3* 2.5* 2.5* 2.5* 2.6*   No results for input(s): LIPASE, AMYLASE in the last 168 hours. No results for input(s): AMMONIA in the last 168 hours. Diabetic: No results for input(s): HGBA1C in the last 72 hours. Recent Labs  Lab 11/08/21 1125 11/08/21 1548 11/08/21 2007 11/09/21 0740 11/09/21 1155  GLUCAP 204* 141* 161* 136* 213*   Cardiac Enzymes: No results for input(s): CKTOTAL, CKMB, CKMBINDEX, TROPONINI in the last 168 hours. No results for input(s): PROBNP in the last 8760 hours. Coagulation Profile: No results for input(s): INR, PROTIME in the last 168 hours. Thyroid Function Tests: No results for input(s): TSH, T4TOTAL, FREET4, T3FREE, THYROIDAB in the last 72 hours. Lipid Profile: No results for input(s): CHOL, HDL, LDLCALC, TRIG, CHOLHDL, LDLDIRECT in the last 72 hours. Anemia Panel: No results for input(s): VITAMINB12, FOLATE, FERRITIN, TIBC, IRON, RETICCTPCT in the last 72 hours. Urine analysis:    Component Value Date/Time   COLORURINE YELLOW 10/14/2021 1422   APPEARANCEUR CLOUDY (A) 10/14/2021 1422   LABSPEC 1.016 10/14/2021 1422   PHURINE 5.0 10/14/2021 1422   GLUCOSEU 50 (A) 10/14/2021 1422   HGBUR SMALL (A) 10/14/2021 1422   BILIRUBINUR NEGATIVE 10/14/2021 1422   KETONESUR NEGATIVE 10/14/2021 1422   PROTEINUR 100 (A) 10/14/2021 1422   NITRITE NEGATIVE 10/14/2021 1422   LEUKOCYTESUR NEGATIVE 10/14/2021 1422   Sepsis Labs: Invalid input(s): PROCALCITONIN, [place]  Microbiology: No results found for this or any previous visit (from the past 240 hour(s)).  Radiology Studies: No results found.    Maureen Jones T. Kilbourne  If 7PM-7AM, please contact night-coverage [email or web address] 11/09/2021, 3:08 PM

## 2021-11-09 NOTE — Progress Notes (Signed)
Occupational Therapy Treatment Patient Details Name: Maureen Jones MRN: [id number] DOB: 04/11/1966 Today's Date: 11/09/2021   History of present illness 55 y.o. female admitted 09/20/21 with AMS. 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 CXR with low lung volumes, L>R interstitial opacities. Acute metabolic encephalopathy in setting of HTN urgency, possible PRES. ETT (10/10-10/12, 10/12-10/14). Course complicated by confusion/agitation overnight 10/17. 10/25 trach and bronch with return to vent.  11/2 pt passed out while returning from the bathroom pt with respiratory distress from disloged trach leading to PEA arrest with CPR x 10 min. ETT via stoma.  Trach revised 11/2.  PMHx:HTN, DM.   OT comments  Pt supine in bed and agreeable to OT session. Functionally completing ADLs in room with supervision (toileting, grooming, LB dressing).  Pt only able to recall 2/3 items during simple recall task, pt very surprised about this.  Plan to complete pill box test next session.  Pt with decreased tolerance to wearing PMV during mobility to bathroom in room.  Will need constant supervision at dc.    Recommendations for follow up therapy are one component of a multi-disciplinary discharge planning process, led by the attending physician.  Recommendations may be updated based on patient status, additional functional criteria and insurance authorization.    Follow Up Recommendations  Home health OT    Assistance Recommended at Discharge Frequent or constant Supervision/Assistance  Equipment Recommendations  BSC/3in1    Recommendations for Other Services      Precautions / Restrictions Precautions Precautions: Fall;Other (comment) Precaution Comments: trach Restrictions Weight Bearing Restrictions: No       Mobility Bed Mobility Overal bed mobility: Modified Independent                  Transfers Overall transfer level: Needs assistance   Transfers: Sit to/from Stand Sit to Stand:  Supervision           General transfer comment: cueing for hand placement for power up from commode     Balance Overall balance assessment: Needs assistance Sitting-balance support: Feet supported Sitting balance-[name]: Good     Standing balance support: No upper extremity supported;During functional activity Standing balance-[name]: Fair                             ADL either performed or assessed with clinical judgement   ADL Overall ADL's : Needs assistance/impaired     Grooming: Supervision/safety;Wash/dry hands;Standing               Lower Body Dressing: Supervision/safety;Sit to/from stand   Toilet Transfer: Supervision/safety;Ambulation;Rolling walker (2 wheels);Grab bars Toilet Transfer Details (indicate cue type and reason): cueing for hand placement with increased effort to ascend from commode Toileting- Clothing Manipulation and Hygiene: Supervision/safety;Sit to/from stand;Sitting/lateral lean       Functional mobility during ADLs: Supervision/safety;Rolling walker (2 wheels);Cueing for safety      Extremity/Trunk Assessment              Vision   Vision Assessment?: No apparent visual deficits   Perception     Praxis      Cognition Arousal/Alertness: Awake/alert Behavior During Therapy: WFL for tasks assessed/performed Overall Cognitive Status: Impaired/Different from baseline Area of Impairment: Memory                               General Comments: pt with decreased recall of 1/3 words after 10  minutes. Pt very suprised she was unable to recall all  the words.  Plan for pill box next sesssion.          Exercises     Shoulder Instructions       General Comments VSS on 8L 35% TC; pt attempted mobility in room with PMV but pt reports increased effort and requested to remove it    Pertinent Vitals/ Pain       Pain Assessment: No/denies pain  Home Living                                           Prior Functioning/Environment              Frequency  Min 2X/week        Progress Toward Goals  OT Goals(current goals can now be found in the care plan section)  Progress towards OT goals: Progressing toward goals  Acute Rehab OT Goals OT Goal Formulation: With patient Time For Goal Achievement: 11/10/21  Plan Discharge plan remains appropriate;Frequency remains appropriate    Co-evaluation                 AM-PAC OT "6 Clicks" Daily Activity     Outcome Measure   Help from another person eating meals?: None Help from another person taking care of personal grooming?: A Little Help from another person toileting, which includes using toliet, bedpan, or urinal?: A Little Help from another person bathing (including washing, rinsing, drying)?: A Little Help from another person to put on and taking off regular upper body clothing?: A Little Help from another person to put on and taking off regular lower body clothing?: A Little 6 Click Score: 19    End of Session Equipment Utilized During Treatment: Oxygen (TC)  OT Visit Diagnosis: Other abnormalities of gait and mobility (R26.89)   Activity Tolerance Patient tolerated treatment well   Patient Left in chair;with call bell/phone within reach;with chair alarm set   Nurse Communication Mobility status        Time: 1638-4536 OT Time Calculation (min): 24 min  Charges: OT General Charges $OT Visit: 1 Visit OT Treatments $Self Care/Home Management : 23-37 mins  Jolaine Artist, OT Acute Rehabilitation Services Pager [phone] Office [phone]   Delight Stare 11/09/2021, 12:03 PM

## 2021-11-10 DIAGNOSIS — J9601 Acute respiratory failure with hypoxia: Secondary | ICD-10-CM | POA: Diagnosis not present

## 2021-11-10 DIAGNOSIS — G9340 Encephalopathy, unspecified: Secondary | ICD-10-CM | POA: Diagnosis not present

## 2021-11-10 LAB — GLUCOSE, CAPILLARY
Glucose-Capillary: 170 mg/dL — ABNORMAL HIGH (ref 70–99)
Glucose-Capillary: 199 mg/dL — ABNORMAL HIGH (ref 70–99)
Glucose-Capillary: 196 mg/dL — ABNORMAL HIGH (ref 70–99)
Glucose-Capillary: 134 mg/dL — ABNORMAL HIGH (ref 70–99)
Glucose-Capillary: 181 mg/dL — ABNORMAL HIGH (ref 70–99)

## 2021-11-10 NOTE — Progress Notes (Signed)
PROGRESS NOTE  Maureen Jones AST:[id number] DOB: May 12, 1966   PCP: Pcp, No  Patient is from: Home  DOA: 09/20/2021 LOS: 70  Chief complaints:  Chief Complaint  Patient presents with   Code Stroke     Brief Narrative / Interim history:  55 year old F with PMH of DM-2, CKD-3B, HTN and asthma presented to [facility]  on 10/10 with AMS, hypertensive emergency and acute hypoxic respiratory failure due to VCD/supraglottic and glottic edema with possible asthma exacerbation.  She was extubated on 10/12 but reintubated due to increased work of breathing.  She was extubated again on 10/14 but had to reintubated on 10/25 due to stridor and marked work of breathing.  Eventually, had tracheostomy.  Patient had PEA arrest after dislodged tracheostomy requiring 10 minutes of CPR and 2 mg of epi before ROSC on 11/2.  She had redo tracheostomy with 6 XLT.  She was started on trach collar on 11/8, and tracheostomy downsized to 5 uncuffed proximal XLT [place] on 11/14.  PCCM following for trach.   Subjective:  Patient denies any complaints this morning, reports good appetite, good BM, she denies any chest pain, shortness of breath, there is no significant events overnight as discussed with staff.    Objective: Vitals:   11/09/21 2352 11/10/21 0437 11/10/21 0500 11/10/21 0736  BP: (!) 151/85   (!) 144/64  Pulse: 87 85  89  Resp: 19 12  17   Temp: 98 F (36.7 C)   98.3 F (36.8 C)  TempSrc: Axillary   Oral  SpO2: 94% 98%  96%  Weight:   105.2 kg   Height:       No intake or output data in the 24 hours ending 11/10/21 0942  Filed Weights   11/08/21 0500 11/09/21 0500 11/10/21 0500  Weight: 102.7 kg 105.5 kg 105.2 kg    Examination:  Awake Alert, Oriented X 3, No new F.N deficits, Normal affect Trach + Symmetrical Chest wall movement, Good air movement bilaterally, CTAB RRR,No Gallops,Rubs or new Murmurs, No Parasternal Heave +ve B.Sounds, Abd Soft, No tenderness, No rebound - guarding or  rigidity. No Cyanosis, Clubbing or edema, No new Rash or bruise     Procedures:  See above  Microbiology summarized: 10/10-COVID-19 and influenza PCR nonreactive. 10/10, 10/17 and 11/3-MRSA PCR nonreactive. 10/10-blood cultures NGTD. 10/14, 30/81, 10/28, 11/3 and 11/5-respiratory culture with normal respiratory flora.  Assessment & Plan:  Acute respiratory failure with hypoxia due to vocal cord dysfunction/supraglottic and glottic edema and possible asthma exacerbation -Patient did require multiple intubation and extubation, currently trach dependent.   -Trach on 10/25. Downsized to #5 uncuffed trach.  Tolerating PMV.  Weaning oxygen.  Trach management per PCCM -Continue Brovana, Pulmicort and as needed DuoNeb. -Called and talked to [name], who can't be here until 12/1 for trach teaching.  Uncontrolled DM-2 with hyperglycemia, gastroparesis and other complication: L8X 21%. Recent Labs  Lab 11/09/21 0740 11/09/21 1155 11/09/21 1703 11/09/21 2041 11/10/21 0735  GLUCAP 136* 213* 99 209* 170*  -All appears to be controlled on current regimen of Levemir 25 units twice daily and NovoLog 6 units before meals.    Hyperlipidemia -Continue atorvastatin.  PEA arrest-in the setting of dislodged tracheostomy-ROSC after 10 minutes of CPR and 2 mg of epi. -Optimize electrolytes -Continue telemetry monitoring  Uncontrolled hypertension: Normotensive today. -Continue with Norvasc, hydralazine, Imdur and Coreg  AKI on CKD-3B/azotemia: Cr seems to be stable from 1.7-2.0. Recent Labs    10/27/21 0114 10/28/21 0059 10/30/21 0015 11/01/21 0030  11/03/21 0057 11/04/21 0139 11/06/21 0116 11/07/21 0102 11/08/21 0125 11/09/21 0351  BUN 33* 33* 29* 26* 22* 24* 29* 30* 29* 28*  CREATININE 2.03* 1.92* 1.81* 1.81* 1.68* 1.72* 1.89* 2.04* 1.83* 2.02*  -Recheck renal function intermittently.   Health Care associated pneumonia:  -Completed 7 days of antibiotics   Agitation-likely ICU  delirium.  Seems to have resolved. -Continue to wean down clonazepam and and Seroquel gradually   Normocytic  anemia: H&H relatively stable.  Anemia panel basically normal. Recent Labs    10/27/21 0114 10/28/21 0059 10/30/21 0015 11/01/21 0030 11/03/21 0057 11/04/21 0139 11/06/21 0116 11/07/21 0102 11/08/21 0125 11/09/21 0351  HGB 8.1* 8.0* 8.0* 8.1* 8.2* 8.3* 8.4* 8.2* 8.3* 8.2*  -Continue monitoring   Hypomagnesemia and hypophosphatemia: Resolved.   Dysphagia/nutrition: Resolved. -Continue regular diet per SLP    DVT prophylaxis:  heparin injection 5,000 Units Start: 10/15/21 1400 SCDs Start: 09/20/21 0831  Code Status: Partial code with NIPPV/intubation/ACLS meds but no CPR or DCCV. Family Communication: None at bedside Level of care: Progressive Status is: Inpatient  Remains inpatient appropriate because: Safe disposition with tracheostomy  Final disposition-TBD  Consultants:  Pulmonology Cardiology   Sch Meds:  Scheduled Meds:  amLODipine  10 mg Oral Daily   arformoterol  15 mcg Nebulization BID   aspirin  81 mg Oral Daily   atorvastatin  20 mg Oral Daily   budesonide (PULMICORT) nebulizer solution  0.25 mg Nebulization BID   carvedilol  25 mg Oral BID   chlorhexidine  15 mL Mouth Rinse BID   clonazePAM  0.5 mg Oral BID   DULoxetine  60 mg Oral Daily   ferrous sulfate  325 mg Oral Q breakfast   fluticasone  2 spray Each Nare Daily   guaiFENesin  1,200 mg Oral BID   heparin injection (subcutaneous)  5,000 Units Subcutaneous Q8H   hydrALAZINE  100 mg Oral Q8H   insulin aspart  0-15 Units Subcutaneous TID WC   insulin aspart  0-5 Units Subcutaneous QHS   insulin aspart  6 Units Subcutaneous TID WC   insulin detemir  27 Units Subcutaneous BID   isosorbide mononitrate  30 mg Oral Daily   lidocaine  2 patch Transdermal Q24H   loratadine  10 mg Oral Daily   mouth rinse  15 mL Mouth Rinse q12n4p   metoCLOPramide  5 mg Oral TID AC   montelukast  10 mg  Oral QHS   multivitamin with minerals  1 tablet Oral Daily   pantoprazole  40 mg Oral BID   QUEtiapine  25 mg Oral BID   sodium chloride flush  10-40 mL Intracatheter Q12H   Continuous Infusions:  sodium chloride Stopped (10/21/21 0722)   PRN Meds:.sodium chloride, acetaminophen, albuterol, dextrose, docusate sodium, guaiFENesin, hydrALAZINE, nitroGLYCERIN, ondansetron (ZOFRAN) IV, phenol, polyethylene glycol, sodium chloride  Antimicrobials: Anti-infectives (From admission, onward)    Start     Dose/Rate Route Frequency Ordered Stop   10/18/21 0830  ceFEPIme (MAXIPIME) 2 g in sodium chloride 0.9 % 100 mL IVPB  Status:  Discontinued        2 g 200 mL/hr over 30 Minutes Intravenous Every 24 hours 10/17/21 1055 10/17/21 1056   10/17/21 2200  ceFEPIme (MAXIPIME) 2 g in sodium chloride 0.9 % 100 mL IVPB        2 g 200 mL/hr over 30 Minutes Intravenous Every 12 hours 10/17/21 1056 10/18/21 2145   10/14/21 0830  ceFEPIme (MAXIPIME) 2 g in sodium chloride 0.9 %  100 mL IVPB  Status:  Discontinued        2 g 200 mL/hr over 30 Minutes Intravenous Every 24 hours 10/14/21 0721 10/17/21 1055   10/14/21 0215  piperacillin-tazobactam (ZOSYN) IVPB 3.375 g        3.375 g 100 mL/hr over 30 Minutes Intravenous  Once 10/14/21 0126 10/14/21 0302   10/04/21 1100  vancomycin (VANCOREADY) IVPB 1250 mg/250 mL  Status:  Discontinued        1,250 mg 166.7 mL/hr over 90 Minutes Intravenous Every 48 hours 10/02/21 0939 10/03/21 1508   10/02/21 2200  ceFEPIme (MAXIPIME) 2 g in sodium chloride 0.9 % 100 mL IVPB  Status:  Discontinued        2 g 200 mL/hr over 30 Minutes Intravenous Every 12 hours 10/02/21 0948 10/04/21 1010   10/02/21 1030  vancomycin (VANCOREADY) IVPB 2000 mg/400 mL        2,000 mg 200 mL/hr over 120 Minutes Intravenous  Once 10/02/21 0939 10/02/21 1324   09/27/21 0900  ceFEPIme (MAXIPIME) 2 g in sodium chloride 0.9 % 100 mL IVPB  Status:  Discontinued        2 g 200 mL/hr over 30 Minutes  Intravenous Every 12 hours 09/27/21 0800 10/02/21 0948   09/27/21 0900  vancomycin (VANCOREADY) IVPB 1500 mg/300 mL  Status:  Discontinued        1,500 mg 150 mL/hr over 120 Minutes Intravenous Every 48 hours 09/27/21 0800 09/28/21 1058   09/21/21 0800  cefTRIAXone (ROCEPHIN) 2 g in sodium chloride 0.9 % 100 mL IVPB        2 g 200 mL/hr over 30 Minutes Intravenous Every 24 hours 09/21/21 0742 09/25/21 0808   09/20/21 0945  cefTRIAXone (ROCEPHIN) 2 g in sodium chloride 0.9 % 100 mL IVPB  Status:  Discontinued        2 g 200 mL/hr over 30 Minutes Intravenous Every 24 hours 09/20/21 0936 09/21/21 0742        I have personally reviewed the following labs and images: CBC: Recent Labs  Lab 11/04/21 0139 11/06/21 0116 11/07/21 0102 11/08/21 0125 11/09/21 0351  HGB 8.3* 8.4* 8.2* 8.3* 8.2*  HCT 27.2* 27.0* 27.3* 27.3* 27.3*   BMP &GFR Recent Labs  Lab 11/04/21 0139 11/06/21 0116 11/07/21 0102 11/08/21 0125 11/09/21 0351  NA 137 136 138 139 139  K 4.4 4.3 4.2 4.1 4.1  CL 104 101 103 105 103  CO2 24 28 28 27 29   GLUCOSE 165* 189* 157* 151* 176*  BUN 24* 29* 30* 29* 28*  CREATININE 1.72* 1.89* 2.04* 1.83* 2.02*  CALCIUM 9.5 9.5 9.6 9.7 9.9  MG 1.7 1.7 2.2 2.0 1.9  PHOS 3.8 3.2 4.1 4.0 3.8   Estimated Creatinine Clearance: 39.1 mL/min (A) (by C-G formula based on SCr of 2.02 mg/dL (H)). Liver & Pancreas: Recent Labs  Lab 11/04/21 0139 11/06/21 0116 11/07/21 0102 11/08/21 0125 11/09/21 0351  ALBUMIN 2.3* 2.5* 2.5* 2.5* 2.6*   No results for input(s): LIPASE, AMYLASE in the last 168 hours. No results for input(s): AMMONIA in the last 168 hours. Diabetic: No results for input(s): HGBA1C in the last 72 hours. Recent Labs  Lab 11/09/21 0740 11/09/21 1155 11/09/21 1703 11/09/21 2041 11/10/21 0735  GLUCAP 136* 213* 99 209* 170*   Cardiac Enzymes: No results for input(s): CKTOTAL, CKMB, CKMBINDEX, TROPONINI in the last 168 hours. No results for input(s): PROBNP in  the last 8760 hours. Coagulation Profile: No results for input(s): INR,  PROTIME in the last 168 hours. Thyroid Function Tests: No results for input(s): TSH, T4TOTAL, FREET4, T3FREE, THYROIDAB in the last 72 hours. Lipid Profile: No results for input(s): CHOL, HDL, LDLCALC, TRIG, CHOLHDL, LDLDIRECT in the last 72 hours. Anemia Panel: No results for input(s): VITAMINB12, FOLATE, FERRITIN, TIBC, IRON, RETICCTPCT in the last 72 hours. Urine analysis:    Component Value Date/Time   COLORURINE YELLOW 10/14/2021 1422   APPEARANCEUR CLOUDY (A) 10/14/2021 1422   LABSPEC 1.016 10/14/2021 1422   PHURINE 5.0 10/14/2021 1422   GLUCOSEU 50 (A) 10/14/2021 1422   HGBUR SMALL (A) 10/14/2021 1422   BILIRUBINUR NEGATIVE 10/14/2021 1422   KETONESUR NEGATIVE 10/14/2021 1422   PROTEINUR 100 (A) 10/14/2021 1422   NITRITE NEGATIVE 10/14/2021 1422   LEUKOCYTESUR NEGATIVE 10/14/2021 1422   Sepsis Labs: Invalid input(s): PROCALCITONIN, [place]  Microbiology: No results found for this or any previous visit (from the past 240 hour(s)).  Radiology Studies: No results found.   Phillips Climes MD [facility]  If 7PM-7AM, please contact night-coverage [email or web address] 11/10/2021, 9:42 AM

## 2021-11-10 NOTE — Progress Notes (Signed)
Occupational Therapy Treatment Patient Details Name: Maureen Jones MRN: [id number] DOB: 1966-10-20 Today's Date: 11/10/2021   History of present illness 55 y.o. female admitted 09/20/21 with AMS. 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 CXR with low lung volumes, L>R interstitial opacities. Acute metabolic encephalopathy in setting of HTN urgency, possible PRES. ETT (10/10-10/12, 10/12-10/14). Course complicated by confusion/agitation overnight 10/17. 10/25 trach and bronch with return to vent.  11/2 pt passed out while returning from the bathroom pt with respiratory distress from disloged trach leading to PEA arrest with CPR x 10 min. ETT via stoma.  Trach revised 11/2.  PMHx:HTN, DM.   OT comments  Patient continues to make steady progress towards goals in skilled OT session. Patient's session encompassed  Pill Box Assessment with results as follows. Functional cognition further assessed with The Pillbox Test: A Measure of Executive Functioning and Estimate of Medication Management. A straight pass/fail designation is determined by 3 or more errors of omission or misplacement on the task. The pt completed the test with less than 3 errors.   Patient able to complete pillbox assessment in a time of 7 minutes and 37 seconds. While patient went over designated time, therapist posits that this is due to decreased fine motor coordination and longer fingernails making placement more difficult. Patient with no need for correction and demonstrated no errors when completing test. Discharge remains appropriate, therapy to follow in house.     Recommendations for follow up therapy are one component of a multi-disciplinary discharge planning process, led by the attending physician.  Recommendations may be updated based on patient status, additional functional criteria and insurance authorization.    Follow Up Recommendations  Home health OT    Assistance Recommended at Discharge Frequent or constant  Supervision/Assistance  Equipment Recommendations  BSC/3in1    Recommendations for Other Services      Precautions / Restrictions Precautions Precautions: Fall;Other (comment) Precaution Comments: trach Restrictions Weight Bearing Restrictions: No       Mobility Bed Mobility Overal bed mobility: Modified Independent Bed Mobility: Supine to Sit     Supine to sit: Modified independent (Device/Increase time)     General bed mobility comments: pt  left sitting at the edge of bed    Transfers                         Balance Overall balance assessment: Needs assistance Sitting-balance support: No upper extremity supported;Feet supported Sitting balance-[name]: Good Sitting balance - Comments: EOB without physical assist                                   ADL either performed or assessed with clinical judgement   ADL                                         General ADL Comments: pill box test completed to date    Extremity/Trunk Assessment              Vision       Perception     Praxis      Cognition Arousal/Alertness: Awake/alert Behavior During Therapy: WFL for tasks assessed/performed Overall Cognitive Status: Within Functional Limits for tasks assessed  General Comments: pil box test completed to date          Exercises     Shoulder Instructions       General Comments      Pertinent Vitals/ Pain       Pain Assessment: No/denies pain  Home Living                                          Prior Functioning/Environment              Frequency  Min 2X/week        Progress Toward Goals  OT Goals(current goals can now be found in the care plan section)  Progress towards OT goals: Progressing toward goals  Acute Rehab OT Goals OT Goal Formulation: With patient Time For Goal Achievement: 11/10/21 Potential to Achieve  Goals: Good  Plan Discharge plan remains appropriate;Frequency remains appropriate    Co-evaluation                 AM-PAC OT "6 Clicks" Daily Activity     Outcome Measure   Help from another person eating meals?: None Help from another person taking care of personal grooming?: A Little Help from another person toileting, which includes using toliet, bedpan, or urinal?: A Little Help from another person bathing (including washing, rinsing, drying)?: A Little Help from another person to put on and taking off regular upper body clothing?: A Little Help from another person to put on and taking off regular lower body clothing?: A Little 6 Click Score: 19    End of Session Equipment Utilized During Treatment: Oxygen (TC)  OT Visit Diagnosis: Other abnormalities of gait and mobility (R26.89)   Activity Tolerance Patient tolerated treatment well   Patient Left with call bell/phone within reach;with chair alarm set;in bed   Nurse Communication Mobility status        Time: 3016-0109 OT Time Calculation (min): 18 min  Charges: OT General Charges $OT Visit: 1 Visit OT Treatments $Therapeutic Activity: 8-22 mins  Corinne Ports E. Tylia Ewell, COTA/L Acute Rehabilitation Services [place] 11/10/2021, 2:50 PM

## 2021-11-10 NOTE — TOC Progression Note (Signed)
Transition of Care [facility]) - Progression Note    Patient Details  Name: Maureen Jones MRN: [id number] Date of Birth: Dec 11, 1966  Transition of Care [facility]) CM/SW Contact  Cyndi Bender, RN Phone Number: 11/10/2021, 1:12 PM  Clinical Narrative:    Home Health and DME ordered. Patient agreeable to use in house provider. Hospital bed, 3&1 and walker ordered from [employer]. Notified Zach with [employer] for [place] supplies. Thedore Mins is going to check if he can send out RT as well. HH-PT/OT/SLP ordered. Spoke to [employer] with [facility] and referral accepted.   Expected Discharge Plan: [place] Barriers to Discharge: No [facility] will accept this patient  Expected Discharge Plan and Services Expected Discharge Plan: [place]   Discharge Planning Services: CM Consult Post Acute Care Choice: Durable Medical Equipment Living arrangements for the past 2 months: Mobile Home                 DME Arranged: Hospital bed, [place] supplies, 3-N-1, Environmental consultant DME Agency: [employer] Date DME Agency Contacted: 11/10/21 Time DME Agency Contacted: 1026 Representative spoke with at DME Agency: [place] (for trach needs) HH Arranged: RN, PT, OT, Speech Therapy HH Agency: [place] Date [facility] Agency Contacted: 09/29/21 Time [place]: 1620 Representative spoke with at [place]: [place] ([place]) Interventions    Readmission Risk Interventions No flowsheet data found.

## 2021-11-10 NOTE — Progress Notes (Signed)
Physical Therapy Treatment Patient Details Name: Maureen Jones MRN: [id number] DOB: Apr 26, 1966 Today's Date: 11/10/2021   History of Present Illness 55 y.o. female admitted 09/20/21 with AMS. 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 CXR with low lung volumes, L>R interstitial opacities. Acute metabolic encephalopathy in setting of HTN urgency, possible PRES. ETT (10/10-10/12, 10/12-10/14). Course complicated by confusion/agitation overnight 10/17. 10/25 trach and bronch with return to vent.  11/2 pt passed out while returning from the bathroom pt with respiratory distress from disloged trach leading to PEA arrest with CPR x 10 min. ETT via stoma.  Trach revised 11/2.  PMHx:HTN, DM.    PT Comments    Pt tolerates treatment well, ambulating for increased distances this session. Pt does report difficulty breathing with use of PMV at this time and requests removal prior to mobilizing. Pt will benefit from continued aggressive mobilization to improve activity tolerance and to aide in a return to independence.   Recommendations for follow up therapy are one component of a multi-disciplinary discharge planning process, led by the attending physician.  Recommendations may be updated based on patient status, additional functional criteria and insurance authorization.  Follow Up Recommendations  Home health PT     Assistance Recommended at Discharge Intermittent Supervision/Assistance  Equipment Recommendations  Rolling walker (2 wheels)    Recommendations for Other Services       Precautions / Restrictions Precautions Precautions: Fall;Other (comment) Precaution Comments: trach Restrictions Weight Bearing Restrictions: No     Mobility  Bed Mobility               General bed mobility comments: pt received and left sitting at the edge of bed    Transfers Overall transfer level: Needs assistance Equipment used: None Transfers: Sit to/from Stand Sit to Stand: Supervision                 Ambulation/Gait Ambulation/Gait assistance: Supervision Gait Distance (Feet): 300 Feet Assistive device: None Gait Pattern/deviations: Step-through pattern Gait velocity: reduced Gait velocity interpretation: 1.31 - 2.62 ft/sec, indicative of limited community ambulator   General Gait Details: pt with steady step-through gait, mild lateral drift   Stairs             Wheelchair Mobility    Modified Rankin (Stroke Patients Only)       Balance Overall balance assessment: Needs assistance Sitting-balance support: No upper extremity supported;Feet supported Sitting balance-[name]: Good     Standing balance support: No upper extremity supported;During functional activity Standing balance-[name]: Good                              Cognition Arousal/Alertness: Awake/alert Behavior During Therapy: WFL for tasks assessed/performed Overall Cognitive Status: Impaired/Different from baseline Area of Impairment: Memory                     Memory: Decreased short-term memory         General Comments: pt with impaired awareness of room location initially, reports knowing her room number but initially appears to attempt to enter different room than her own prior to self-correcting        Exercises      General Comments General comments (skin integrity, edema, etc.): VSS on trach collar, 8L 35% FiO2. Pt does report SOB with use of PMV, removes PMV prior to mobility      Pertinent Vitals/Pain Pain Assessment: Faces Faces Pain Scale: Hurts little more  Pain Location: feet Pain Descriptors / Indicators: Sore Pain Intervention(s): Monitored during session    Home Living                          Prior Function            PT Goals (current goals can now be found in the care plan section) Acute Rehab PT Goals Patient Stated Goal: to breathe better PT Goal Formulation: With patient Time For Goal Achievement:  11/24/21 Potential to Achieve Goals: Good Progress towards PT goals: Progressing toward goals    Frequency    Min 3X/week      PT Plan Current plan remains appropriate    Co-evaluation              AM-PAC PT "6 Clicks" Mobility   Outcome Measure  Help needed turning from your back to your side while in a flat bed without using bedrails?: None Help needed moving from lying on your back to sitting on the side of a flat bed without using bedrails?: None Help needed moving to and from a bed to a chair (including a wheelchair)?: A Little Help needed standing up from a chair using your arms (e.g., wheelchair or bedside chair)?: A Little Help needed to walk in hospital room?: A Little Help needed climbing 3-5 steps with a railing? : A Little 6 Click Score: 20    End of Session Equipment Utilized During Treatment: Oxygen Activity Tolerance: Patient tolerated treatment well Patient left: in bed;with call bell/phone within reach Nurse Communication: Mobility status PT Visit Diagnosis: Other abnormalities of gait and mobility (R26.89);Difficulty in walking, not elsewhere classified (R26.2);Muscle weakness (generalized) (M62.81)     Time: 8675-4492 PT Time Calculation (min) (ACUTE ONLY): 15 min  Charges:  $Gait Training: 8-22 mins                     Zenaida Niece, PT, DPT Acute Rehabilitation Pager: [phone] Office [place] 11/10/2021, 10:49 AM

## 2021-11-11 LAB — CBC
HCT: 27 % — ABNORMAL LOW (ref 36.0–46.0)
Hemoglobin: 8.3 g/dL — ABNORMAL LOW (ref 12.0–15.0)
MCH: 29.4 pg (ref 26.0–34.0)
MCHC: 30.7 g/dL (ref 30.0–36.0)
MCV: 95.7 fL (ref 80.0–100.0)
Platelets: 211 10*3/uL (ref 150–400)
RBC: 2.82 MIL/uL — ABNORMAL LOW (ref 3.87–5.11)
RDW: 14.7 % (ref 11.5–15.5)
WBC: 9.1 10*3/uL (ref 4.0–10.5)
nRBC: 0 % (ref 0.0–0.2)

## 2021-11-11 LAB — BASIC METABOLIC PANEL
Anion gap: 8 (ref 5–15)
BUN: 28 mg/dL — ABNORMAL HIGH (ref 6–20)
CO2: 27 mmol/L (ref 22–32)
Calcium: 9.9 mg/dL (ref 8.9–10.3)
Chloride: 104 mmol/L (ref 98–111)
Creatinine, Ser: 2.03 mg/dL — ABNORMAL HIGH (ref 0.44–1.00)
GFR, Estimated: 29 mL/min — ABNORMAL LOW (ref >60)
Glucose, Bld: 186 mg/dL — ABNORMAL HIGH (ref 70–99)
Potassium: 4.2 mmol/L (ref 3.5–5.1)
Sodium: 139 mmol/L (ref 135–145)

## 2021-11-11 LAB — GLUCOSE, CAPILLARY
Glucose-Capillary: 202 mg/dL — ABNORMAL HIGH (ref 70–99)
Glucose-Capillary: 283 mg/dL — ABNORMAL HIGH (ref 70–99)
Glucose-Capillary: 183 mg/dL — ABNORMAL HIGH (ref 70–99)
Glucose-Capillary: 165 mg/dL — ABNORMAL HIGH (ref 70–99)

## 2021-11-11 NOTE — Progress Notes (Signed)
PROGRESS NOTE  Maureen Jones HMC:[id number] DOB: Jan 23, 1966   PCP: Pcp, No  Patient is from: Home  DOA: 09/20/2021 LOS: 37  Chief complaints:  Chief Complaint  Patient presents with   Code Stroke     Brief Narrative / Interim history:  55 year old F with PMH of DM-2, CKD-3B, HTN and asthma presented to [facility]  on 10/10 with AMS, hypertensive emergency and acute hypoxic respiratory failure due to VCD/supraglottic and glottic edema with possible asthma exacerbation.  She was extubated on 10/12 but reintubated due to increased work of breathing.  She was extubated again on 10/14 but had to reintubated on 10/25 due to stridor and marked work of breathing.  Eventually, had tracheostomy.  Patient had PEA arrest after dislodged tracheostomy requiring 10 minutes of CPR and 2 mg of epi before ROSC on 11/2.  She had redo tracheostomy with 6 XLT.  She was started on trach collar on 11/8, and tracheostomy downsized to 5 uncuffed proximal XLT [place] on 11/14.  PCCM following for trach.   Subjective:  Patient denies any complaints, no events overnight, reports she had a good night sleep, good bowel movement, no dyspnea  Objective: Vitals:   11/11/21 0745 11/11/21 0803 11/11/21 1119 11/11/21 1132  BP: (!) 154/80  (!) 147/73   Pulse: 83 87 81 85  Resp: 17 19 14 18   Temp: 98.3 F (36.8 C)  98.4 F (36.9 C)   TempSrc: Oral  Oral   SpO2: 95% 99% 98% 97%  Weight:      Height:       No intake or output data in the 24 hours ending 11/11/21 1321  Filed Weights   11/09/21 0500 11/10/21 0500 11/11/21 0500  Weight: 105.5 kg 105.2 kg 104.1 kg    Examination:  Awake Alert, Oriented X 3, No new F.N deficits, Normal affect Trach + Symmetrical Chest wall movement, Good air movement bilaterally, CTAB RRR,No Gallops,Rubs or new Murmurs, No Parasternal Heave +ve B.Sounds, Abd Soft, No tenderness, No rebound - guarding or rigidity. No Cyanosis, Clubbing or edema, No new Rash or bruise       Procedures:  See above  Microbiology summarized: 10/10-COVID-19 and influenza PCR nonreactive. 10/10, 10/17 and 11/3-MRSA PCR nonreactive. 10/10-blood cultures NGTD. 10/14, 30/81, 10/28, 11/3 and 11/5-respiratory culture with normal respiratory flora.  Assessment & Plan:  Acute respiratory failure with hypoxia due to vocal cord dysfunction/supraglottic and glottic edema and possible asthma exacerbation -Patient did require multiple intubation and extubation, currently trach dependent.   -Trach on 10/25. Downsized to #5 uncuffed trach.  Tolerating PMV.  Weaning oxygen.  Trach management per PCCM -Continue Brovana, Pulmicort and as needed DuoNeb. -Eventual plan for patient to go home with home health, she has some family friends will come to have trach teaching where is appropriate for her to be discharged home at 1 point, hopefully early next week.  Uncontrolled DM-2 with hyperglycemia, gastroparesis and other complication: M6Q 94%. Recent Labs  Lab 11/10/21 1454 11/10/21 1802 11/10/21 2059 11/11/21 0810 11/11/21 1118  GLUCAP 196* 134* 181* 202* 283*  -All appears to be controlled on current regimen of Levemir 25 units twice daily and NovoLog 6 units before meals.    Hyperlipidemia -Continue atorvastatin.  PEA arrest-in the setting of dislodged tracheostomy -ROSC after 10 minutes of CPR and 2 mg of epi. -Optimize electrolytes -Continue telemetry monitoring  Uncontrolled hypertension:  -Pressure is acceptable on current regimen, continue with Norvasc, hydralazine, Imdur and Coreg.     AKI on CKD-3B/azotemia:  Cr seems to be stable from 1.7-2.0. Recent Labs    10/28/21 0059 10/30/21 0015 11/01/21 0030 11/03/21 0057 11/04/21 0139 11/06/21 0116 11/07/21 0102 11/08/21 0125 11/09/21 0351 11/11/21 0249  BUN 33* 29* 26* 22* 24* 29* 30* 29* 28* 28*  CREATININE 1.92* 1.81* 1.81* 1.68* 1.72* 1.89* 2.04* 1.83* 2.02* 2.03*  -Recheck renal function intermittently.    Health Care associated pneumonia:  -Completed 7 days of antibiotics   Agitation-likely ICU delirium.  Seems to have resolved. -Continue to wean down clonazepam and and Seroquel gradually   Normocytic  anemia: H&H relatively stable.  Anemia panel basically normal. Recent Labs    10/28/21 0059 10/30/21 0015 11/01/21 0030 11/03/21 0057 11/04/21 0139 11/06/21 0116 11/07/21 0102 11/08/21 0125 11/09/21 0351 11/11/21 0249  HGB 8.0* 8.0* 8.1* 8.2* 8.3* 8.4* 8.2* 8.3* 8.2* 8.3*  -Continue monitoring   Hypomagnesemia and hypophosphatemia: Resolved.   Dysphagia/nutrition: Resolved. -Continue regular diet per SLP    DVT prophylaxis:  heparin injection 5,000 Units Start: 10/15/21 1400 SCDs Start: 09/20/21 0831  Code Status: Partial code with NIPPV/intubation/ACLS meds but no CPR or DCCV. Family Communication: None at bedside Level of care: Progressive Status is: Inpatient  Remains inpatient appropriate because: Safe disposition with tracheostomy  Final disposition-TBD  Consultants:  Pulmonology Cardiology   Sch Meds:  Scheduled Meds:  amLODipine  10 mg Oral Daily   arformoterol  15 mcg Nebulization BID   aspirin  81 mg Oral Daily   atorvastatin  20 mg Oral Daily   budesonide (PULMICORT) nebulizer solution  0.25 mg Nebulization BID   carvedilol  25 mg Oral BID   chlorhexidine  15 mL Mouth Rinse BID   clonazePAM  0.5 mg Oral BID   DULoxetine  60 mg Oral Daily   ferrous sulfate  325 mg Oral Q breakfast   fluticasone  2 spray Each Nare Daily   guaiFENesin  1,200 mg Oral BID   heparin injection (subcutaneous)  5,000 Units Subcutaneous Q8H   hydrALAZINE  100 mg Oral Q8H   insulin aspart  0-15 Units Subcutaneous TID WC   insulin aspart  0-5 Units Subcutaneous QHS   insulin aspart  6 Units Subcutaneous TID WC   insulin detemir  27 Units Subcutaneous BID   isosorbide mononitrate  30 mg Oral Daily   lidocaine  2 patch Transdermal Q24H   loratadine  10 mg Oral Daily    mouth rinse  15 mL Mouth Rinse q12n4p   metoCLOPramide  5 mg Oral TID AC   montelukast  10 mg Oral QHS   multivitamin with minerals  1 tablet Oral Daily   pantoprazole  40 mg Oral BID   QUEtiapine  25 mg Oral BID   sodium chloride flush  10-40 mL Intracatheter Q12H   Continuous Infusions:  sodium chloride Stopped (10/21/21 0722)   PRN Meds:.sodium chloride, acetaminophen, albuterol, dextrose, docusate sodium, guaiFENesin, hydrALAZINE, nitroGLYCERIN, ondansetron (ZOFRAN) IV, phenol, polyethylene glycol, sodium chloride  Antimicrobials: Anti-infectives (From admission, onward)    Start     Dose/Rate Route Frequency Ordered Stop   10/18/21 0830  ceFEPIme (MAXIPIME) 2 g in sodium chloride 0.9 % 100 mL IVPB  Status:  Discontinued        2 g 200 mL/hr over 30 Minutes Intravenous Every 24 hours 10/17/21 1055 10/17/21 1056   10/17/21 2200  ceFEPIme (MAXIPIME) 2 g in sodium chloride 0.9 % 100 mL IVPB        2 g 200 mL/hr over 30 Minutes Intravenous Every  12 hours 10/17/21 1056 10/18/21 2145   10/14/21 0830  ceFEPIme (MAXIPIME) 2 g in sodium chloride 0.9 % 100 mL IVPB  Status:  Discontinued        2 g 200 mL/hr over 30 Minutes Intravenous Every 24 hours 10/14/21 0721 10/17/21 1055   10/14/21 0215  piperacillin-tazobactam (ZOSYN) IVPB 3.375 g        3.375 g 100 mL/hr over 30 Minutes Intravenous  Once 10/14/21 0126 10/14/21 0302   10/04/21 1100  vancomycin (VANCOREADY) IVPB 1250 mg/250 mL  Status:  Discontinued        1,250 mg 166.7 mL/hr over 90 Minutes Intravenous Every 48 hours 10/02/21 0939 10/03/21 1508   10/02/21 2200  ceFEPIme (MAXIPIME) 2 g in sodium chloride 0.9 % 100 mL IVPB  Status:  Discontinued        2 g 200 mL/hr over 30 Minutes Intravenous Every 12 hours 10/02/21 0948 10/04/21 1010   10/02/21 1030  vancomycin (VANCOREADY) IVPB 2000 mg/400 mL        2,000 mg 200 mL/hr over 120 Minutes Intravenous  Once 10/02/21 0939 10/02/21 1324   09/27/21 0900  ceFEPIme (MAXIPIME) 2 g in  sodium chloride 0.9 % 100 mL IVPB  Status:  Discontinued        2 g 200 mL/hr over 30 Minutes Intravenous Every 12 hours 09/27/21 0800 10/02/21 0948   09/27/21 0900  vancomycin (VANCOREADY) IVPB 1500 mg/300 mL  Status:  Discontinued        1,500 mg 150 mL/hr over 120 Minutes Intravenous Every 48 hours 09/27/21 0800 09/28/21 1058   09/21/21 0800  cefTRIAXone (ROCEPHIN) 2 g in sodium chloride 0.9 % 100 mL IVPB        2 g 200 mL/hr over 30 Minutes Intravenous Every 24 hours 09/21/21 0742 09/25/21 0808   09/20/21 0945  cefTRIAXone (ROCEPHIN) 2 g in sodium chloride 0.9 % 100 mL IVPB  Status:  Discontinued        2 g 200 mL/hr over 30 Minutes Intravenous Every 24 hours 09/20/21 0936 09/21/21 0742        I have personally reviewed the following labs and images: CBC: Recent Labs  Lab 11/06/21 0116 11/07/21 0102 11/08/21 0125 11/09/21 0351 11/11/21 0249  WBC  --   --   --   --  9.1  HGB 8.4* 8.2* 8.3* 8.2* 8.3*  HCT 27.0* 27.3* 27.3* 27.3* 27.0*  MCV  --   --   --   --  95.7  PLT  --   --   --   --  211   BMP &GFR Recent Labs  Lab 11/06/21 0116 11/07/21 0102 11/08/21 0125 11/09/21 0351 11/11/21 0249  NA 136 138 139 139 139  K 4.3 4.2 4.1 4.1 4.2  CL 101 103 105 103 104  CO2 28 28 27 29 27   GLUCOSE 189* 157* 151* 176* 186*  BUN 29* 30* 29* 28* 28*  CREATININE 1.89* 2.04* 1.83* 2.02* 2.03*  CALCIUM 9.5 9.6 9.7 9.9 9.9  MG 1.7 2.2 2.0 1.9  --   PHOS 3.2 4.1 4.0 3.8  --    Estimated Creatinine Clearance: 38.6 mL/min (A) (by C-G formula based on SCr of 2.03 mg/dL (H)). Liver & Pancreas: Recent Labs  Lab 11/06/21 0116 11/07/21 0102 11/08/21 0125 11/09/21 0351  ALBUMIN 2.5* 2.5* 2.5* 2.6*   No results for input(s): LIPASE, AMYLASE in the last 168 hours. No results for input(s): AMMONIA in the last 168 hours. Diabetic:  No results for input(s): HGBA1C in the last 72 hours. Recent Labs  Lab 11/10/21 1454 11/10/21 1802 11/10/21 2059 11/11/21 0810 11/11/21 1118   GLUCAP 196* 134* 181* 202* 283*   Cardiac Enzymes: No results for input(s): CKTOTAL, CKMB, CKMBINDEX, TROPONINI in the last 168 hours. No results for input(s): PROBNP in the last 8760 hours. Coagulation Profile: No results for input(s): INR, PROTIME in the last 168 hours. Thyroid Function Tests: No results for input(s): TSH, T4TOTAL, FREET4, T3FREE, THYROIDAB in the last 72 hours. Lipid Profile: No results for input(s): CHOL, HDL, LDLCALC, TRIG, CHOLHDL, LDLDIRECT in the last 72 hours. Anemia Panel: No results for input(s): VITAMINB12, FOLATE, FERRITIN, TIBC, IRON, RETICCTPCT in the last 72 hours. Urine analysis:    Component Value Date/Time   COLORURINE YELLOW 10/14/2021 1422   APPEARANCEUR CLOUDY (A) 10/14/2021 1422   LABSPEC 1.016 10/14/2021 1422   PHURINE 5.0 10/14/2021 1422   GLUCOSEU 50 (A) 10/14/2021 1422   HGBUR SMALL (A) 10/14/2021 1422   BILIRUBINUR NEGATIVE 10/14/2021 1422   KETONESUR NEGATIVE 10/14/2021 1422   PROTEINUR 100 (A) 10/14/2021 1422   NITRITE NEGATIVE 10/14/2021 1422   LEUKOCYTESUR NEGATIVE 10/14/2021 1422   Sepsis Labs: Invalid input(s): PROCALCITONIN, [place]  Microbiology: No results found for this or any previous visit (from the past 240 hour(s)).  Radiology Studies: No results found.   Phillips Climes MD [facility]  If 7PM-7AM, please contact night-coverage [email or web address] 11/11/2021, 1:21 PM

## 2021-11-11 NOTE — TOC Progression Note (Addendum)
Transition of Care [facility]) - Progression Note    Patient Details  Name: Shenique Childers MRN: [id number] Date of Birth: 04/11/1966  Transition of [facility]) CM/SW Contact  Cyndi Bender, RN Phone Number: 11/11/2021, 1:43 PM  Clinical Narrative:    Kristen Loader, friend, came in for a 2nd session of trach teaching. Plan is for patient to go home with friend on Monday.  I will arrange door to door [facility] transport for Monday at 2pm. [name] is aware that [facility] transport won't go into her house. Patient has signed the transportation wavier and I have emailed the wavier to [employer]. [name] has decided against hospital bed because she doesn't have anywhere to put the bed that is in the room now. Walker and 3&1 will be delivered to the room prior to discharge.     Expected Discharge Plan: [place] Barriers to Discharge: Unsafe home situation  Expected Discharge Plan and Services Expected Discharge Plan: [place]   Discharge Planning Services: CM Consult Post Acute Care Choice: Home Health, Durable Medical Equipment Living arrangements for the past 2 months: Mobile Home                 DME Arranged: Hospital bed, [employer] supplies, 3-N-1, Environmental consultant DME Agency: [employer] Date DME Agency Contacted: 11/10/21 Time DME Agency Contacted: 109 Representative spoke with at DME Agency: [place] (for trach needs) HH Arranged: PT, OT, Speech Therapy HH Agency: [facility] Date [facility] Agency Contacted: 11/10/21 Time [place]: [place] Representative spoke with at [place]: [place] ([place]) Interventions    Readmission Risk Interventions No flowsheet data found.

## 2021-11-11 NOTE — Progress Notes (Signed)
Inpatient Diabetes Program Recommendations  AACE/ADA: New Consensus Statement on Inpatient Glycemic Control   Target Ranges:  Prepandial:   less than 140 mg/dL      Peak postprandial:   less than 180 mg/dL (1-2 hours)      Critically ill patients:  140 - 180 mg/dL    Latest Reference Range & Units 11/11/21 08:10 11/11/21 11:18  Glucose-Capillary 70 - 99 mg/dL 202 (H) 283 (H)    Latest Reference Range & Units 11/10/21 07:35 11/10/21 11:52 11/10/21 14:54 11/10/21 18:02 11/10/21 20:59  Glucose-Capillary 70 - 99 mg/dL 170 (H) 199 (H) 196 (H) 134 (H) 181 (H)   Review of Glycemic Control  Current orders for Inpatient glycemic control: Levemir 27 units BID, Novolog 6 units TID with meals, Novolog 0-15 units TID with meals, Novolog 0-5 units QHS  Inpatient Diabetes Program Recommendations:    Insulin: Please consider increasing Levemir to 28 units BID and meal coverage to Novolog 8 units TID with meals.  Thanks, Barnie Alderman, RN, MSN, CDE Diabetes Coordinator Inpatient Diabetes Program [phone] (Team Pager from 8am to 5pm)

## 2021-11-12 LAB — GLUCOSE, CAPILLARY
Glucose-Capillary: 164 mg/dL — ABNORMAL HIGH (ref 70–99)
Glucose-Capillary: 268 mg/dL — ABNORMAL HIGH (ref 70–99)
Glucose-Capillary: 182 mg/dL — ABNORMAL HIGH (ref 70–99)
Glucose-Capillary: 200 mg/dL — ABNORMAL HIGH (ref 70–99)

## 2021-11-12 MED ORDER — INSULIN DETEMIR 100 UNIT/ML ~~LOC~~ SOLN
28.0000 [IU] | Freq: Two times a day (BID) | SUBCUTANEOUS | Status: DC
  Administered 2021-11-12 – 2021-11-13 (×3): 28 [IU] via SUBCUTANEOUS
  Filled 2021-11-12 (×4): qty 0.28

## 2021-11-12 MED ORDER — INSULIN ASPART 100 UNIT/ML IJ SOLN
8.0000 [IU] | Freq: Three times a day (TID) | INTRAMUSCULAR | Status: DC
  Administered 2021-11-12 – 2021-11-15 (×11): 8 [IU] via SUBCUTANEOUS

## 2021-11-12 NOTE — Progress Notes (Addendum)
Speech Language Pathology Treatment: [name] Speaking valve  Patient Details Name: Maureen Jones MRN: [id number] DOB: 06/16/1966 Today's Date: 11/12/2021 Time: 5397-6734 SLP Time Calculation (min) (ACUTE ONLY): 29 min  Assessment / Plan / Recommendation Clinical Impression  Pt was seen for PMSV treatment. Pt reported more dyspnea this week when she uses the PMSV and she reported that she has not been using it as frequently. Pt tolerated PMSV for ~20 minutes total with a break; vitals were stable and no evidence of significant air trapping was noted. However, vocal quality was strained with intermittent glottal fry, and respiratory support was more impaired than when she was last seen on 11/25. Pt demonstrated slightly increased WOB after ~10 minutes, and stated that her preference would be to remove it since it was becoming more uncomfortable. Pt was independent with donning and doffing PMSV and she verbalized agreement with continuing to work on improved coordination of respiration with speech. SLP reached out to [place], [place]  with CCM during the session to help assess possible causes of pt's change in PMSV tolerance and vocal quality compared to the last session. SLP will continue to follow pt.     HPI HPI: Pt is a 55 y/o female admitted with AMS 10/10. Stroke w/u negative, dx with acute metabolic encephalopathy in the setting of hypertensive emergency. ETT 10/10-10/12; reintubated 10/12 d/t increased WOB, not mobilizing secretions, stridor, received steroids, extubated 10/14 with lingering stridor, which was improving 10/15. ENT scoped pt 10/18: "Edema of the glottis and subglottic larynx following recent intubation and then repeat intubation.  There is no evidence of cord paralysis or any tumor present.  There is currently no stridor.  Allow more time for resolution of the swelling.  Unfortunately she is at risk for developing subglottic stenosis." SLP followed pt 10/15-10/24. MBS 10/19: trace  posterior spillage, mild pharyngeal delay, trace transient penetration; regular texture diet with thin liquids recommended and SLP ultimately signed off with pt demonstrating adequate tolerance. 10/25: Stridor again noted; pt intubated and trach placed.  MBS 10/27: oropharyngeal dysphagia characterized by reduced bolus cohesion, reduced hyolaryngeal elevation, reduced anterior laryngeal movement, a pharyngeal delay, penetration (PAS 3, 5) with thin liquids. A dyspahgia 3 diet with nectar thick liquids was initiated and she was subsequently advanced to regular and thin liquids on 10/30. Pt with respiratory distress from disloged trach on 11/2 leading to PEA arrest with CPR x 10 min. ETT via stoma. Trach revised 11/2; #6XLT distal.  Cortrak placed 11/4. Trach changed from 6XL distal to 6XL proximal 11/5 and then to 5XLT.      SLP Plan  Continue with current plan of care      Recommendations for follow up therapy are one component of a multi-disciplinary discharge planning process, led by the attending physician.  Recommendations may be updated based on patient status, additional functional criteria and insurance authorization.    Recommendations  Diet recommendations: Regular;Thin liquid Liquids provided via: Cup;No straw Medication Administration: Whole meds with puree Supervision: Patient able to self feed Compensations: Slow rate;Small sips/bites      Patient may use Passy-Muir Speech Valve: During all waking hours (remove during sleep) PMSV Supervision: Intermittent         Oral Care Recommendations: Oral care BID Follow Up Recommendations: Acute inpatient rehab (3hours/day) Assistance recommended at discharge: PRN SLP Visit Diagnosis: Dysphagia, pharyngeal phase (R13.13) Plan: Continue with current plan of care       Meka Lewan I. Hardin Negus, [place], [place] Office number [phone]  Pager [place]  11/12/2021,  1:54 PM

## 2021-11-12 NOTE — Progress Notes (Signed)
PT Cancellation Note  Patient Details Name: Maureen Jones MRN: [id number] DOB: [birth year]   Cancelled Treatment:    Reason Eval/Treat Not Completed: Other (comment);Fatigue/lethargy limiting ability to participate. Pt did two other therapy sessions and reports being tired now.  Follow up as time and pt allow, esp since dc plan is home.   Ramond Dial 11/12/2021, 4:35 PM  Mee Hives, PT PhD Acute Rehab Dept. Number: [place] and [place]

## 2021-11-12 NOTE — Progress Notes (Signed)
PROGRESS NOTE  Maureen Jones GNO:[id number] DOB: Sep 29, 1966   PCP: Pcp, No  Patient is from: Home  DOA: 09/20/2021 LOS: 12  Chief complaints:  Chief Complaint  Patient presents with   Code Stroke     Brief Narrative / Interim history:  55 year old F with PMH of DM-2, CKD-3B, HTN and asthma presented to [facility]  on 10/10 with AMS, hypertensive emergency and acute hypoxic respiratory failure due to VCD/supraglottic and glottic edema with possible asthma exacerbation.  She was extubated on 10/12 but reintubated due to increased work of breathing.  She was extubated again on 10/14 but had to reintubated on 10/25 due to stridor and marked work of breathing.  Eventually, had tracheostomy.  Patient had PEA arrest after dislodged tracheostomy requiring 10 minutes of CPR and 2 mg of epi before ROSC on 11/2.  She had redo tracheostomy with 6 XLT.  She was started on trach collar on 11/8, and tracheostomy downsized to 5 uncuffed proximal XLT Shiley on 11/14.  PCCM following for trach.   Subjective:  She denies any complaints today, she reports good night sleep, good appetite.    Objective: Vitals:   11/12/21 0448 11/12/21 0545 11/12/21 0750 11/12/21 0855  BP:  134/71 (!) 186/91   Pulse: 87  93 97  Resp: 16  16 18   Temp:   98.2 F (36.8 C)   TempSrc:   Oral   SpO2: 94%  95% 99%  Weight: 105.3 kg     Height:        Intake/Output Summary (Last 24 hours) at 11/12/2021 1133 Last data filed at 11/12/2021 0830 Gross per 24 hour  Intake 240 ml  Output --  Net 240 ml    Filed Weights   11/10/21 0500 11/11/21 0500 11/12/21 0448  Weight: 105.2 kg 104.1 kg 105.3 kg    Examination:  Awake Alert, Oriented X 3, No new F.N deficits, Normal affect Trach + Symmetrical Chest wall movement, Good air movement bilaterally, CTAB RRR,No Gallops,Rubs or new Murmurs, No Parasternal Heave +ve B.Sounds, Abd Soft, No tenderness, No rebound - guarding or rigidity. No Cyanosis, Clubbing or edema, No new  Rash or bruise       Procedures:  See above  Microbiology summarized: 10/10-COVID-19 and influenza PCR nonreactive. 10/10, 10/17 and 11/3-MRSA PCR nonreactive. 10/10-blood cultures NGTD. 10/14, 30/81, 10/28, 11/3 and 11/5-respiratory culture with normal respiratory flora.  Assessment & Plan:  Acute respiratory failure with hypoxia due to vocal cord dysfunction/supraglottic and glottic edema and possible asthma exacerbation -Patient did require multiple intubation and extubation, currently trach dependent.   -Trach on 10/25. Downsized to #5 uncuffed trach.  Tolerating PMV.  Weaning oxygen.  Trach management per PCCM -Continue Brovana, Pulmicort and as needed DuoNeb. -Patient denies any respiratory distress, no increased aggression, she is tolerating her PMV, patient and her family friend being educated about trach care, hoping to plan to discharge home with home health next week.    Uncontrolled DM-2 with hyperglycemia, gastroparesis and other complication: V6X 45%. Recent Labs  Lab 11/11/21 1118 11/11/21 1555 11/11/21 2047 11/12/21 0753 11/12/21 1116  GLUCAP 283* 183* 165* 164* 268*  -All appears to be controlled on current regimen of Levemir 25 units twice daily and NovoLog 6 units before meals.  She is likely to be discharged on this regimen.  Hyperlipidemia -Continue atorvastatin.  PEA arrest-in the setting of dislodged tracheostomy -ROSC after 10 minutes of CPR and 2 mg of epi. -Optimize electrolytes -Continue telemetry monitoring  Uncontrolled hypertension:  -  Pressure is acceptable on current regimen, continue with Norvasc, hydralazine, Imdur and Coreg.     AKI on CKD-3B/azotemia: Cr seems to be stable from 1.7-2.0. Recent Labs    10/28/21 0059 10/30/21 0015 11/01/21 0030 11/03/21 0057 11/04/21 0139 11/06/21 0116 11/07/21 0102 11/08/21 0125 11/09/21 0351 11/11/21 0249  BUN 33* 29* 26* 22* 24* 29* 30* 29* 28* 28*  CREATININE 1.92* 1.81* 1.81* 1.68*  1.72* 1.89* 2.04* 1.83* 2.02* 2.03*  -Recheck renal function intermittently.   Health Care associated pneumonia:  -Completed 7 days of antibiotics   Agitation-likely ICU delirium.  Seems to have resolved. -Continue to wean down clonazepam and and Seroquel gradually   Normocytic  anemia: H&H relatively stable.  Anemia panel basically normal. Recent Labs    10/28/21 0059 10/30/21 0015 11/01/21 0030 11/03/21 0057 11/04/21 0139 11/06/21 0116 11/07/21 0102 11/08/21 0125 11/09/21 0351 11/11/21 0249  HGB 8.0* 8.0* 8.1* 8.2* 8.3* 8.4* 8.2* 8.3* 8.2* 8.3*  -Continue monitoring   Hypomagnesemia and hypophosphatemia: Resolved.   Dysphagia/nutrition: Resolved. -Continue regular diet per SLP    DVT prophylaxis:  heparin injection 5,000 Units Start: 10/15/21 1400 SCDs Start: 09/20/21 0831  Code Status: Partial code with NIPPV/intubation/ACLS meds but no CPR or DCCV. Family Communication: None at bedside Level of care: Telemetry Medical Status is: Inpatient  Remains inpatient appropriate because: Safe disposition with tracheostomy  Final disposition-TBD  Consultants:  Pulmonology Cardiology   Sch Meds:  Scheduled Meds:  amLODipine  10 mg Oral Daily   arformoterol  15 mcg Nebulization BID   aspirin  81 mg Oral Daily   atorvastatin  20 mg Oral Daily   budesonide (PULMICORT) nebulizer solution  0.25 mg Nebulization BID   carvedilol  25 mg Oral BID   chlorhexidine  15 mL Mouth Rinse BID   clonazePAM  0.5 mg Oral BID   DULoxetine  60 mg Oral Daily   ferrous sulfate  325 mg Oral Q breakfast   fluticasone  2 spray Each Nare Daily   guaiFENesin  1,200 mg Oral BID   heparin injection (subcutaneous)  5,000 Units Subcutaneous Q8H   hydrALAZINE  100 mg Oral Q8H   insulin aspart  0-15 Units Subcutaneous TID WC   insulin aspart  0-5 Units Subcutaneous QHS   insulin aspart  8 Units Subcutaneous TID WC   insulin detemir  28 Units Subcutaneous BID   isosorbide mononitrate  30 mg  Oral Daily   lidocaine  2 patch Transdermal Q24H   loratadine  10 mg Oral Daily   mouth rinse  15 mL Mouth Rinse q12n4p   metoCLOPramide  5 mg Oral TID AC   montelukast  10 mg Oral QHS   multivitamin with minerals  1 tablet Oral Daily   pantoprazole  40 mg Oral BID   QUEtiapine  25 mg Oral BID   sodium chloride flush  10-40 mL Intracatheter Q12H   Continuous Infusions:  sodium chloride Stopped (10/21/21 0722)   PRN Meds:.sodium chloride, acetaminophen, albuterol, dextrose, docusate sodium, guaiFENesin, hydrALAZINE, nitroGLYCERIN, ondansetron (ZOFRAN) IV, phenol, polyethylene glycol, sodium chloride  Antimicrobials: Anti-infectives (From admission, onward)    Start     Dose/Rate Route Frequency Ordered Stop   10/18/21 0830  ceFEPIme (MAXIPIME) 2 g in sodium chloride 0.9 % 100 mL IVPB  Status:  Discontinued        2 g 200 mL/hr over 30 Minutes Intravenous Every 24 hours 10/17/21 1055 10/17/21 1056   10/17/21 2200  ceFEPIme (MAXIPIME) 2 g in sodium chloride  0.9 % 100 mL IVPB        2 g 200 mL/hr over 30 Minutes Intravenous Every 12 hours 10/17/21 1056 10/18/21 2145   10/14/21 0830  ceFEPIme (MAXIPIME) 2 g in sodium chloride 0.9 % 100 mL IVPB  Status:  Discontinued        2 g 200 mL/hr over 30 Minutes Intravenous Every 24 hours 10/14/21 0721 10/17/21 1055   10/14/21 0215  piperacillin-tazobactam (ZOSYN) IVPB 3.375 g        3.375 g 100 mL/hr over 30 Minutes Intravenous  Once 10/14/21 0126 10/14/21 0302   10/04/21 1100  vancomycin (VANCOREADY) IVPB 1250 mg/250 mL  Status:  Discontinued        1,250 mg 166.7 mL/hr over 90 Minutes Intravenous Every 48 hours 10/02/21 0939 10/03/21 1508   10/02/21 2200  ceFEPIme (MAXIPIME) 2 g in sodium chloride 0.9 % 100 mL IVPB  Status:  Discontinued        2 g 200 mL/hr over 30 Minutes Intravenous Every 12 hours 10/02/21 0948 10/04/21 1010   10/02/21 1030  vancomycin (VANCOREADY) IVPB 2000 mg/400 mL        2,000 mg 200 mL/hr over 120 Minutes  Intravenous  Once 10/02/21 0939 10/02/21 1324   09/27/21 0900  ceFEPIme (MAXIPIME) 2 g in sodium chloride 0.9 % 100 mL IVPB  Status:  Discontinued        2 g 200 mL/hr over 30 Minutes Intravenous Every 12 hours 09/27/21 0800 10/02/21 0948   09/27/21 0900  vancomycin (VANCOREADY) IVPB 1500 mg/300 mL  Status:  Discontinued        1,500 mg 150 mL/hr over 120 Minutes Intravenous Every 48 hours 09/27/21 0800 09/28/21 1058   09/21/21 0800  cefTRIAXone (ROCEPHIN) 2 g in sodium chloride 0.9 % 100 mL IVPB        2 g 200 mL/hr over 30 Minutes Intravenous Every 24 hours 09/21/21 0742 09/25/21 0808   09/20/21 0945  cefTRIAXone (ROCEPHIN) 2 g in sodium chloride 0.9 % 100 mL IVPB  Status:  Discontinued        2 g 200 mL/hr over 30 Minutes Intravenous Every 24 hours 09/20/21 0936 09/21/21 0742        I have personally reviewed the following labs and images: CBC: Recent Labs  Lab 11/06/21 0116 11/07/21 0102 11/08/21 0125 11/09/21 0351 11/11/21 0249  WBC  --   --   --   --  9.1  HGB 8.4* 8.2* 8.3* 8.2* 8.3*  HCT 27.0* 27.3* 27.3* 27.3* 27.0*  MCV  --   --   --   --  95.7  PLT  --   --   --   --  211   BMP &GFR Recent Labs  Lab 11/06/21 0116 11/07/21 0102 11/08/21 0125 11/09/21 0351 11/11/21 0249  NA 136 138 139 139 139  K 4.3 4.2 4.1 4.1 4.2  CL 101 103 105 103 104  CO2 28 28 27 29 27   GLUCOSE 189* 157* 151* 176* 186*  BUN 29* 30* 29* 28* 28*  CREATININE 1.89* 2.04* 1.83* 2.02* 2.03*  CALCIUM 9.5 9.6 9.7 9.9 9.9  MG 1.7 2.2 2.0 1.9  --   PHOS 3.2 4.1 4.0 3.8  --    Estimated Creatinine Clearance: 38.9 mL/min (A) (by C-G formula based on SCr of 2.03 mg/dL (H)). Liver & Pancreas: Recent Labs  Lab 11/06/21 0116 11/07/21 0102 11/08/21 0125 11/09/21 0351  ALBUMIN 2.5* 2.5* 2.5* 2.6*   No  results for input(s): LIPASE, AMYLASE in the last 168 hours. No results for input(s): AMMONIA in the last 168 hours. Diabetic: No results for input(s): HGBA1C in the last 72 hours. Recent  Labs  Lab 11/11/21 1118 11/11/21 1555 11/11/21 2047 11/12/21 0753 11/12/21 1116  GLUCAP 283* 183* 165* 164* 268*   Cardiac Enzymes: No results for input(s): CKTOTAL, CKMB, CKMBINDEX, TROPONINI in the last 168 hours. No results for input(s): PROBNP in the last 8760 hours. Coagulation Profile: No results for input(s): INR, PROTIME in the last 168 hours. Thyroid Function Tests: No results for input(s): TSH, T4TOTAL, FREET4, T3FREE, THYROIDAB in the last 72 hours. Lipid Profile: No results for input(s): CHOL, HDL, LDLCALC, TRIG, CHOLHDL, LDLDIRECT in the last 72 hours. Anemia Panel: No results for input(s): VITAMINB12, FOLATE, FERRITIN, TIBC, IRON, RETICCTPCT in the last 72 hours. Urine analysis:    Component Value Date/Time   COLORURINE YELLOW 10/14/2021 1422   APPEARANCEUR CLOUDY (A) 10/14/2021 1422   LABSPEC 1.016 10/14/2021 1422   PHURINE 5.0 10/14/2021 1422   GLUCOSEU 50 (A) 10/14/2021 1422   HGBUR SMALL (A) 10/14/2021 1422   BILIRUBINUR NEGATIVE 10/14/2021 1422   KETONESUR NEGATIVE 10/14/2021 1422   PROTEINUR 100 (A) 10/14/2021 1422   NITRITE NEGATIVE 10/14/2021 1422   LEUKOCYTESUR NEGATIVE 10/14/2021 1422   Sepsis Labs: Invalid input(s): PROCALCITONIN, [place]  Microbiology: No results found for this or any previous visit (from the past 240 hour(s)).  Radiology Studies: No results found.   Phillips Climes MD [facility]  If 7PM-7AM, please contact night-coverage [email or web address] 11/12/2021, 11:33 AM

## 2021-11-13 DIAGNOSIS — E119 Type 2 diabetes mellitus without complications: Secondary | ICD-10-CM

## 2021-11-13 LAB — GLUCOSE, CAPILLARY
Glucose-Capillary: 172 mg/dL — ABNORMAL HIGH (ref 70–99)
Glucose-Capillary: 308 mg/dL — ABNORMAL HIGH (ref 70–99)
Glucose-Capillary: 169 mg/dL — ABNORMAL HIGH (ref 70–99)
Glucose-Capillary: 162 mg/dL — ABNORMAL HIGH (ref 70–99)

## 2021-11-13 MED ORDER — INSULIN DETEMIR 100 UNIT/ML ~~LOC~~ SOLN
32.0000 [IU] | Freq: Two times a day (BID) | SUBCUTANEOUS | Status: DC
  Administered 2021-11-13 – 2021-11-15 (×4): 32 [IU] via SUBCUTANEOUS
  Filled 2021-11-13 (×5): qty 0.32

## 2021-11-13 NOTE — Progress Notes (Signed)
PROGRESS NOTE  Maureen Jones NGE:[id number] DOB: Jun 10, 1966   PCP: Pcp, No  Patient is from: Home  DOA: 09/20/2021 LOS: 48  Chief complaints:  Chief Complaint  Patient presents with   Code Stroke     Brief Narrative / Interim history:  55 year old F with PMH of DM-2, CKD-3B, HTN and asthma presented to [facility]  on 10/10 with AMS, hypertensive emergency and acute hypoxic respiratory failure due to VCD/supraglottic and glottic edema with possible asthma exacerbation.  She was extubated on 10/12 but reintubated due to increased work of breathing.  She was extubated again on 10/14 but had to reintubated on 10/25 due to stridor and marked work of breathing.  Eventually, had tracheostomy.  Patient had PEA arrest after dislodged tracheostomy requiring 10 minutes of CPR and 2 mg of epi before ROSC on 11/2.  She had redo tracheostomy with 6 XLT.  She was started on trach collar on 11/8, and tracheostomy downsized to 5 uncuffed proximal XLT Shiley on 11/14.  PCCM following for trach.   Subjective:  She denies any complaints today, she reports good night sleep, good appetite.    Objective: Vitals:   11/13/21 0754 11/13/21 0828 11/13/21 1125 11/13/21 1137  BP:  (!) 153/82  (!) 163/78  Pulse:  88 93 93  Resp:  18 16 16   Temp:  98.3 F (36.8 C)  98.5 F (36.9 C)  TempSrc:  Oral  Oral  SpO2: 92% 96% 95% 95%  Weight:      Height:        Intake/Output Summary (Last 24 hours) at 11/13/2021 1358 Last data filed at 11/13/2021 0845 Gross per 24 hour  Intake 280 ml  Output --  Net 280 ml    Filed Weights   11/10/21 0500 11/11/21 0500 11/12/21 0448  Weight: 105.2 kg 104.1 kg 105.3 kg    Examination:  Awake Alert, Oriented X 3, No new F.N deficits, Normal affect Trach + RRR,No Gallops,Rubs or new Murmurs, No Parasternal Heave +ve B.Sounds, Abd Soft, No tenderness, No rebound - guarding or rigidity. No Cyanosis, Clubbing or edema, No new Rash or bruise        Procedures:  See  above  Microbiology summarized: 10/10-COVID-19 and influenza PCR nonreactive. 10/10, 10/17 and 11/3-MRSA PCR nonreactive. 10/10-blood cultures NGTD. 10/14, 30/81, 10/28, 11/3 and 11/5-respiratory culture with normal respiratory flora.  Assessment & Plan:  Acute respiratory failure with hypoxia due to vocal cord dysfunction/supraglottic and glottic edema and possible asthma exacerbation -Patient did require multiple intubation and extubation, currently trach dependent.   -Trach on 10/25. Downsized to #5 uncuffed trach.  Tolerating PMV.  Weaning oxygen.  Trach management per PCCM -Continue Brovana, Pulmicort and as needed DuoNeb. -Patient denies any respiratory distress, no increased secretions, she is tolerating her PMV, patient and her family friend being educated about trach care, hoping to plan to discharge home with home health next week.    Uncontrolled DM-2 with hyperglycemia, gastroparesis and other complication: M0N 02%. Recent Labs  Lab 11/12/21 1116 11/12/21 1502 11/12/21 2047 11/13/21 0830 11/13/21 1141  GLUCAP 268* 182* 200* 172* 308*  -CBG remains elevated despite increasing her Levemir to 28 units, and NovoLog to 8 units, so I will increase further to 32 units twice daily and 8 units before meals.    Hyperlipidemia -Continue atorvastatin.  PEA arrest-in the setting of dislodged tracheostomy -ROSC after 10 minutes of CPR and 2 mg of epi. -Optimize electrolytes -Continue telemetry monitoring  Uncontrolled hypertension:  -Pressure is acceptable  on current regimen, continue with Norvasc, hydralazine, Imdur and Coreg.     AKI on CKD-3B/azotemia: Cr seems to be stable from 1.7-2.0. Recent Labs    10/28/21 0059 10/30/21 0015 11/01/21 0030 11/03/21 0057 11/04/21 0139 11/06/21 0116 11/07/21 0102 11/08/21 0125 11/09/21 0351 11/11/21 0249  BUN 33* 29* 26* 22* 24* 29* 30* 29* 28* 28*  CREATININE 1.92* 1.81* 1.81* 1.68* 1.72* 1.89* 2.04* 1.83* 2.02* 2.03*   -Recheck renal function intermittently.   Health Care associated pneumonia:  -Completed 7 days of antibiotics   Agitation-likely ICU delirium.  Seems to have resolved. -Continue to wean down clonazepam and and Seroquel gradually   Normocytic  anemia: H&H relatively stable.  Anemia panel basically normal. Recent Labs    10/28/21 0059 10/30/21 0015 11/01/21 0030 11/03/21 0057 11/04/21 0139 11/06/21 0116 11/07/21 0102 11/08/21 0125 11/09/21 0351 11/11/21 0249  HGB 8.0* 8.0* 8.1* 8.2* 8.3* 8.4* 8.2* 8.3* 8.2* 8.3*  -Continue monitoring   Hypomagnesemia and hypophosphatemia: Resolved.   Dysphagia/nutrition: Resolved. -Continue regular diet per SLP    DVT prophylaxis:  heparin injection 5,000 Units Start: 10/15/21 1400 SCDs Start: 09/20/21 0831  Code Status: Partial code with NIPPV/intubation/ACLS meds but no CPR or DCCV. Family Communication: None at bedside Level of care: Telemetry Medical Status is: Inpatient  Remains inpatient appropriate because: Safe disposition with tracheostomy  Final disposition-TBD  Consultants:  Pulmonology Cardiology   Sch Meds:  Scheduled Meds:  amLODipine  10 mg Oral Daily   arformoterol  15 mcg Nebulization BID   aspirin  81 mg Oral Daily   atorvastatin  20 mg Oral Daily   budesonide (PULMICORT) nebulizer solution  0.25 mg Nebulization BID   carvedilol  25 mg Oral BID   chlorhexidine  15 mL Mouth Rinse BID   clonazePAM  0.5 mg Oral BID   DULoxetine  60 mg Oral Daily   ferrous sulfate  325 mg Oral Q breakfast   fluticasone  2 spray Each Nare Daily   guaiFENesin  1,200 mg Oral BID   heparin injection (subcutaneous)  5,000 Units Subcutaneous Q8H   hydrALAZINE  100 mg Oral Q8H   insulin aspart  0-15 Units Subcutaneous TID WC   insulin aspart  0-5 Units Subcutaneous QHS   insulin aspart  8 Units Subcutaneous TID WC   insulin detemir  28 Units Subcutaneous BID   isosorbide mononitrate  30 mg Oral Daily   lidocaine  2 patch  Transdermal Q24H   loratadine  10 mg Oral Daily   mouth rinse  15 mL Mouth Rinse q12n4p   metoCLOPramide  5 mg Oral TID AC   montelukast  10 mg Oral QHS   multivitamin with minerals  1 tablet Oral Daily   pantoprazole  40 mg Oral BID   QUEtiapine  25 mg Oral BID   sodium chloride flush  10-40 mL Intracatheter Q12H   Continuous Infusions:  sodium chloride Stopped (10/21/21 0722)   PRN Meds:.sodium chloride, acetaminophen, albuterol, dextrose, docusate sodium, guaiFENesin, hydrALAZINE, nitroGLYCERIN, ondansetron (ZOFRAN) IV, phenol, polyethylene glycol, sodium chloride  Antimicrobials: Anti-infectives (From admission, onward)    Start     Dose/Rate Route Frequency Ordered Stop   10/18/21 0830  ceFEPIme (MAXIPIME) 2 g in sodium chloride 0.9 % 100 mL IVPB  Status:  Discontinued        2 g 200 mL/hr over 30 Minutes Intravenous Every 24 hours 10/17/21 1055 10/17/21 1056   10/17/21 2200  ceFEPIme (MAXIPIME) 2 g in sodium chloride 0.9 % 100  mL IVPB        2 g 200 mL/hr over 30 Minutes Intravenous Every 12 hours 10/17/21 1056 10/18/21 2145   10/14/21 0830  ceFEPIme (MAXIPIME) 2 g in sodium chloride 0.9 % 100 mL IVPB  Status:  Discontinued        2 g 200 mL/hr over 30 Minutes Intravenous Every 24 hours 10/14/21 0721 10/17/21 1055   10/14/21 0215  piperacillin-tazobactam (ZOSYN) IVPB 3.375 g        3.375 g 100 mL/hr over 30 Minutes Intravenous  Once 10/14/21 0126 10/14/21 0302   10/04/21 1100  vancomycin (VANCOREADY) IVPB 1250 mg/250 mL  Status:  Discontinued        1,250 mg 166.7 mL/hr over 90 Minutes Intravenous Every 48 hours 10/02/21 0939 10/03/21 1508   10/02/21 2200  ceFEPIme (MAXIPIME) 2 g in sodium chloride 0.9 % 100 mL IVPB  Status:  Discontinued        2 g 200 mL/hr over 30 Minutes Intravenous Every 12 hours 10/02/21 0948 10/04/21 1010   10/02/21 1030  vancomycin (VANCOREADY) IVPB 2000 mg/400 mL        2,000 mg 200 mL/hr over 120 Minutes Intravenous  Once 10/02/21 0939 10/02/21  1324   09/27/21 0900  ceFEPIme (MAXIPIME) 2 g in sodium chloride 0.9 % 100 mL IVPB  Status:  Discontinued        2 g 200 mL/hr over 30 Minutes Intravenous Every 12 hours 09/27/21 0800 10/02/21 0948   09/27/21 0900  vancomycin (VANCOREADY) IVPB 1500 mg/300 mL  Status:  Discontinued        1,500 mg 150 mL/hr over 120 Minutes Intravenous Every 48 hours 09/27/21 0800 09/28/21 1058   09/21/21 0800  cefTRIAXone (ROCEPHIN) 2 g in sodium chloride 0.9 % 100 mL IVPB        2 g 200 mL/hr over 30 Minutes Intravenous Every 24 hours 09/21/21 0742 09/25/21 0808   09/20/21 0945  cefTRIAXone (ROCEPHIN) 2 g in sodium chloride 0.9 % 100 mL IVPB  Status:  Discontinued        2 g 200 mL/hr over 30 Minutes Intravenous Every 24 hours 09/20/21 0936 09/21/21 0742        I have personally reviewed the following labs and images: CBC: Recent Labs  Lab 11/07/21 0102 11/08/21 0125 11/09/21 0351 11/11/21 0249  WBC  --   --   --  9.1  HGB 8.2* 8.3* 8.2* 8.3*  HCT 27.3* 27.3* 27.3* 27.0*  MCV  --   --   --  95.7  PLT  --   --   --  211   BMP &GFR Recent Labs  Lab 11/07/21 0102 11/08/21 0125 11/09/21 0351 11/11/21 0249  NA 138 139 139 139  K 4.2 4.1 4.1 4.2  CL 103 105 103 104  CO2 28 27 29 27   GLUCOSE 157* 151* 176* 186*  BUN 30* 29* 28* 28*  CREATININE 2.04* 1.83* 2.02* 2.03*  CALCIUM 9.6 9.7 9.9 9.9  MG 2.2 2.0 1.9  --   PHOS 4.1 4.0 3.8  --    Estimated Creatinine Clearance: 38.9 mL/min (A) (by C-G formula based on SCr of 2.03 mg/dL (H)). Liver & Pancreas: Recent Labs  Lab 11/07/21 0102 11/08/21 0125 11/09/21 0351  ALBUMIN 2.5* 2.5* 2.6*   No results for input(s): LIPASE, AMYLASE in the last 168 hours. No results for input(s): AMMONIA in the last 168 hours. Diabetic: No results for input(s): HGBA1C in the last 72 hours.  Recent Labs  Lab 11/12/21 1116 11/12/21 1502 11/12/21 2047 11/13/21 0830 11/13/21 1141  GLUCAP 268* 182* 200* 172* 308*   Cardiac Enzymes: No results for  input(s): CKTOTAL, CKMB, CKMBINDEX, TROPONINI in the last 168 hours. No results for input(s): PROBNP in the last 8760 hours. Coagulation Profile: No results for input(s): INR, PROTIME in the last 168 hours. Thyroid Function Tests: No results for input(s): TSH, T4TOTAL, FREET4, T3FREE, THYROIDAB in the last 72 hours. Lipid Profile: No results for input(s): CHOL, HDL, LDLCALC, TRIG, CHOLHDL, LDLDIRECT in the last 72 hours. Anemia Panel: No results for input(s): VITAMINB12, FOLATE, FERRITIN, TIBC, IRON, RETICCTPCT in the last 72 hours. Urine analysis:    Component Value Date/Time   COLORURINE YELLOW 10/14/2021 1422   APPEARANCEUR CLOUDY (A) 10/14/2021 1422   LABSPEC 1.016 10/14/2021 1422   PHURINE 5.0 10/14/2021 1422   GLUCOSEU 50 (A) 10/14/2021 1422   HGBUR SMALL (A) 10/14/2021 1422   BILIRUBINUR NEGATIVE 10/14/2021 1422   KETONESUR NEGATIVE 10/14/2021 1422   PROTEINUR 100 (A) 10/14/2021 1422   NITRITE NEGATIVE 10/14/2021 1422   LEUKOCYTESUR NEGATIVE 10/14/2021 1422   Sepsis Labs: Invalid input(s): PROCALCITONIN, [place]  Microbiology: No results found for this or any previous visit (from the past 240 hour(s)).  Radiology Studies: No results found.   Phillips Climes MD [facility]  If 7PM-7AM, please contact night-coverage [email or web address] 11/13/2021, 1:58 PM

## 2021-11-13 NOTE — Plan of Care (Signed)
  Problem: Clinical Measurements: Goal: Diagnostic test results will improve Outcome: Progressing   

## 2021-11-14 LAB — GLUCOSE, CAPILLARY
Glucose-Capillary: 168 mg/dL — ABNORMAL HIGH (ref 70–99)
Glucose-Capillary: 187 mg/dL — ABNORMAL HIGH (ref 70–99)
Glucose-Capillary: 174 mg/dL — ABNORMAL HIGH (ref 70–99)
Glucose-Capillary: 142 mg/dL — ABNORMAL HIGH (ref 70–99)
Glucose-Capillary: 135 mg/dL — ABNORMAL HIGH (ref 70–99)

## 2021-11-14 NOTE — Plan of Care (Signed)
  Problem: Health Behavior/Discharge Planning: Goal: Ability to manage health-related needs will improve Outcome: Progressing   Problem: Clinical Measurements: Goal: Ability to maintain clinical measurements within normal limits will improve Outcome: Progressing Goal: Will remain free from infection Outcome: Progressing Goal: Diagnostic test results will improve Outcome: Progressing Goal: Respiratory complications will improve Outcome: Progressing Goal: Cardiovascular complication will be avoided Outcome: Progressing   Problem: Activity: Goal: Risk for activity intolerance will decrease Outcome: Progressing   Problem: Nutrition: Goal: Adequate nutrition will be maintained Outcome: Progressing   Problem: Elimination: Goal: Will not experience complications related to bowel motility Outcome: Progressing Goal: Will not experience complications related to urinary retention Outcome: Progressing   Problem: Pain Managment: Goal: General experience of comfort will improve Outcome: Progressing   Problem: Activity: Goal: Ability to tolerate increased activity will improve Outcome: Progressing   Problem: Coping: Goal: Will verbalize positive feelings about self Outcome: Progressing Goal: Will identify appropriate support needs Outcome: Progressing   Problem: Health Behavior/Discharge Planning: Goal: Ability to manage health-related needs will improve Outcome: Progressing   Problem: Self-Care: Goal: Ability to participate in self-care as condition permits will improve Outcome: Progressing Goal: Verbalization of feelings and concerns over difficulty with self-care will improve Outcome: Progressing   Problem: Nutrition: Goal: Risk of aspiration will decrease Outcome: Progressing Goal: Dietary intake will improve Outcome: Progressing   Problem: Intracerebral Hemorrhage Tissue Perfusion: Goal: Complications of Intracerebral Hemorrhage will be minimized Outcome:  Progressing

## 2021-11-14 NOTE — Plan of Care (Signed)
  Problem: Clinical Measurements: Goal: Respiratory complications will improve Outcome: Progressing   Problem: Activity: Goal: Risk for activity intolerance will decrease Outcome: Progressing   Problem: Nutrition: Goal: Adequate nutrition will be maintained Outcome: Progressing   Problem: Elimination: Goal: Will not experience complications related to bowel motility Outcome: Progressing   

## 2021-11-14 NOTE — Progress Notes (Signed)
PROGRESS NOTE  Maureen Jones OJJ:[id number] DOB: 10-Jan-1966   PCP: Pcp, No  Patient is from: Home  DOA: 09/20/2021 LOS: 61  Chief complaints:  Chief Complaint  Patient presents with   Code Stroke     Brief Narrative / Interim history:  55 year old F with PMH of DM-2, CKD-3B, HTN and asthma presented to [facility]  on 10/10 with AMS, hypertensive emergency and acute hypoxic respiratory failure due to VCD/supraglottic and glottic edema with possible asthma exacerbation.  She was extubated on 10/12 but reintubated due to increased work of breathing.  She was extubated again on 10/14 but had to reintubated on 10/25 due to stridor and marked work of breathing.  Eventually, had tracheostomy.  Patient had PEA arrest after dislodged tracheostomy requiring 10 minutes of CPR and 2 mg of epi before ROSC on 11/2.  She had redo tracheostomy with 6 XLT.  She was started on trach collar on 11/8, and tracheostomy downsized to 5 uncuffed proximal XLT [place] on 11/14.  PCCM following for trach.   Subjective:  She denies any complaints today, she reports good night sleep, good appetite.    Objective: Vitals:   11/14/21 0747 11/14/21 0753 11/14/21 1140 11/14/21 1152  BP: (!) 148/81  118/65   Pulse: 93 91 80 82  Resp: 13 14 15 14   Temp: 98.3 F (36.8 C)  98.3 F (36.8 C)   TempSrc: Oral  Oral   SpO2: 95% 100% 100% 96%  Weight:      Height:        Intake/Output Summary (Last 24 hours) at 11/14/2021 1213 Last data filed at 11/13/2021 2129 Gross per 24 hour  Intake 880 ml  Output --  Net 880 ml    Filed Weights   11/10/21 0500 11/11/21 0500 11/12/21 0448  Weight: 105.2 kg 104.1 kg 105.3 kg    Examination:  Awake Alert, Oriented X 3, No new F.N deficits, Normal affect Trach + RRR,No Gallops,Rubs or new Murmurs, No Parasternal Heave +ve B.Sounds, Abd Soft, No tenderness, No rebound - guarding or rigidity. No Cyanosis, Clubbing or edema, No new Rash or bruise        Procedures:  See  above  Microbiology summarized: 10/10-COVID-19 and influenza PCR nonreactive. 10/10, 10/17 and 11/3-MRSA PCR nonreactive. 10/10-blood cultures NGTD. 10/14, 30/81, 10/28, 11/3 and 11/5-respiratory culture with normal respiratory flora.  Assessment & Plan:  Acute respiratory failure with hypoxia due to vocal cord dysfunction/supraglottic and glottic edema and possible asthma exacerbation -Patient did require multiple intubation and extubation, currently trach dependent.   -Trach on 10/25. Downsized to #5 uncuffed trach.  Tolerating PMV.  Weaning oxygen.  Trach management per PCCM -Continue Brovana, Pulmicort and as needed DuoNeb. -Patient denies any respiratory distress, no increased secretions, she is tolerating her PMV, patient and her family friend being educated about trach care, hoping to plan to discharge home with home health next week.    Uncontrolled DM-2 with hyperglycemia, gastroparesis and other complication: H3Z 16%. Recent Labs  Lab 11/13/21 1629 11/13/21 2055 11/14/21 0701 11/14/21 0744 11/14/21 1139  GLUCAP 169* 162* 168* 187* 174*  -CBG remains elevated despite increasing her Levemir to 28 units, and NovoLog to 8 units, so I will increase further to 32 units twice daily and 8 units before meals.    Hyperlipidemia -Continue atorvastatin.  PEA arrest-in the setting of dislodged tracheostomy -ROSC after 10 minutes of CPR and 2 mg of epi. -Optimize electrolytes -Continue telemetry monitoring  Uncontrolled hypertension:  -Pressure is acceptable on  current regimen, continue with Norvasc, hydralazine, Imdur and Coreg.     AKI on CKD-3B/azotemia: Cr seems to be stable from 1.7-2.0. Recent Labs    10/28/21 0059 10/30/21 0015 11/01/21 0030 11/03/21 0057 11/04/21 0139 11/06/21 0116 11/07/21 0102 11/08/21 0125 11/09/21 0351 11/11/21 0249  BUN 33* 29* 26* 22* 24* 29* 30* 29* 28* 28*  CREATININE 1.92* 1.81* 1.81* 1.68* 1.72* 1.89* 2.04* 1.83* 2.02* 2.03*   -Recheck renal function intermittently.   Health Care associated pneumonia:  -Completed 7 days of antibiotics   Agitation-likely ICU delirium.  Seems to have resolved. -Continue to wean down clonazepam and and Seroquel gradually   Normocytic  anemia: H&H relatively stable.  Anemia panel basically normal. Recent Labs    10/28/21 0059 10/30/21 0015 11/01/21 0030 11/03/21 0057 11/04/21 0139 11/06/21 0116 11/07/21 0102 11/08/21 0125 11/09/21 0351 11/11/21 0249  HGB 8.0* 8.0* 8.1* 8.2* 8.3* 8.4* 8.2* 8.3* 8.2* 8.3*  -Continue monitoring   Hypomagnesemia and hypophosphatemia: Resolved.   Dysphagia/nutrition: Resolved. -Continue regular diet per SLP    DVT prophylaxis:  heparin injection 5,000 Units Start: 10/15/21 1400 SCDs Start: 09/20/21 0831  Code Status: Partial code with NIPPV/intubation/ACLS meds but no CPR or DCCV. Family Communication: None at bedside Level of care: Telemetry Medical Status is: Inpatient  Remains inpatient appropriate because: Safe disposition with tracheostomy  Final disposition-Home with home health  Consultants:  Pulmonology Cardiology   Sch Meds:  Scheduled Meds:  amLODipine  10 mg Oral Daily   arformoterol  15 mcg Nebulization BID   aspirin  81 mg Oral Daily   atorvastatin  20 mg Oral Daily   budesonide (PULMICORT) nebulizer solution  0.25 mg Nebulization BID   carvedilol  25 mg Oral BID   chlorhexidine  15 mL Mouth Rinse BID   clonazePAM  0.5 mg Oral BID   DULoxetine  60 mg Oral Daily   ferrous sulfate  325 mg Oral Q breakfast   fluticasone  2 spray Each Nare Daily   guaiFENesin  1,200 mg Oral BID   heparin injection (subcutaneous)  5,000 Units Subcutaneous Q8H   hydrALAZINE  100 mg Oral Q8H   insulin aspart  0-15 Units Subcutaneous TID WC   insulin aspart  0-5 Units Subcutaneous QHS   insulin aspart  8 Units Subcutaneous TID WC   insulin detemir  32 Units Subcutaneous BID   isosorbide mononitrate  30 mg Oral Daily    lidocaine  2 patch Transdermal Q24H   loratadine  10 mg Oral Daily   mouth rinse  15 mL Mouth Rinse q12n4p   metoCLOPramide  5 mg Oral TID AC   montelukast  10 mg Oral QHS   multivitamin with minerals  1 tablet Oral Daily   pantoprazole  40 mg Oral BID   QUEtiapine  25 mg Oral BID   sodium chloride flush  10-40 mL Intracatheter Q12H   Continuous Infusions:  sodium chloride Stopped (10/21/21 0722)   PRN Meds:.sodium chloride, acetaminophen, albuterol, dextrose, docusate sodium, guaiFENesin, hydrALAZINE, nitroGLYCERIN, ondansetron (ZOFRAN) IV, phenol, polyethylene glycol, sodium chloride  Antimicrobials: Anti-infectives (From admission, onward)    Start     Dose/Rate Route Frequency Ordered Stop   10/18/21 0830  ceFEPIme (MAXIPIME) 2 g in sodium chloride 0.9 % 100 mL IVPB  Status:  Discontinued        2 g 200 mL/hr over 30 Minutes Intravenous Every 24 hours 10/17/21 1055 10/17/21 1056   10/17/21 2200  ceFEPIme (MAXIPIME) 2 g in sodium chloride 0.9 %  100 mL IVPB        2 g 200 mL/hr over 30 Minutes Intravenous Every 12 hours 10/17/21 1056 10/18/21 2145   10/14/21 0830  ceFEPIme (MAXIPIME) 2 g in sodium chloride 0.9 % 100 mL IVPB  Status:  Discontinued        2 g 200 mL/hr over 30 Minutes Intravenous Every 24 hours 10/14/21 0721 10/17/21 1055   10/14/21 0215  piperacillin-tazobactam (ZOSYN) IVPB 3.375 g        3.375 g 100 mL/hr over 30 Minutes Intravenous  Once 10/14/21 0126 10/14/21 0302   10/04/21 1100  vancomycin (VANCOREADY) IVPB 1250 mg/250 mL  Status:  Discontinued        1,250 mg 166.7 mL/hr over 90 Minutes Intravenous Every 48 hours 10/02/21 0939 10/03/21 1508   10/02/21 2200  ceFEPIme (MAXIPIME) 2 g in sodium chloride 0.9 % 100 mL IVPB  Status:  Discontinued        2 g 200 mL/hr over 30 Minutes Intravenous Every 12 hours 10/02/21 0948 10/04/21 1010   10/02/21 1030  vancomycin (VANCOREADY) IVPB 2000 mg/400 mL        2,000 mg 200 mL/hr over 120 Minutes Intravenous  Once  10/02/21 0939 10/02/21 1324   09/27/21 0900  ceFEPIme (MAXIPIME) 2 g in sodium chloride 0.9 % 100 mL IVPB  Status:  Discontinued        2 g 200 mL/hr over 30 Minutes Intravenous Every 12 hours 09/27/21 0800 10/02/21 0948   09/27/21 0900  vancomycin (VANCOREADY) IVPB 1500 mg/300 mL  Status:  Discontinued        1,500 mg 150 mL/hr over 120 Minutes Intravenous Every 48 hours 09/27/21 0800 09/28/21 1058   09/21/21 0800  cefTRIAXone (ROCEPHIN) 2 g in sodium chloride 0.9 % 100 mL IVPB        2 g 200 mL/hr over 30 Minutes Intravenous Every 24 hours 09/21/21 0742 09/25/21 0808   09/20/21 0945  cefTRIAXone (ROCEPHIN) 2 g in sodium chloride 0.9 % 100 mL IVPB  Status:  Discontinued        2 g 200 mL/hr over 30 Minutes Intravenous Every 24 hours 09/20/21 0936 09/21/21 0742        I have personally reviewed the following labs and images: CBC: Recent Labs  Lab 11/08/21 0125 11/09/21 0351 11/11/21 0249  WBC  --   --  9.1  HGB 8.3* 8.2* 8.3*  HCT 27.3* 27.3* 27.0*  MCV  --   --  95.7  PLT  --   --  211   BMP &GFR Recent Labs  Lab 11/08/21 0125 11/09/21 0351 11/11/21 0249  NA 139 139 139  K 4.1 4.1 4.2  CL 105 103 104  CO2 27 29 27   GLUCOSE 151* 176* 186*  BUN 29* 28* 28*  CREATININE 1.83* 2.02* 2.03*  CALCIUM 9.7 9.9 9.9  MG 2.0 1.9  --   PHOS 4.0 3.8  --    Estimated Creatinine Clearance: 38.9 mL/min (A) (by C-G formula based on SCr of 2.03 mg/dL (H)). Liver & Pancreas: Recent Labs  Lab 11/08/21 0125 11/09/21 0351  ALBUMIN 2.5* 2.6*   No results for input(s): LIPASE, AMYLASE in the last 168 hours. No results for input(s): AMMONIA in the last 168 hours. Diabetic: No results for input(s): HGBA1C in the last 72 hours. Recent Labs  Lab 11/13/21 1629 11/13/21 2055 11/14/21 0701 11/14/21 0744 11/14/21 1139  GLUCAP 169* 162* 168* 187* 174*   Cardiac Enzymes: No results  for input(s): CKTOTAL, CKMB, CKMBINDEX, TROPONINI in the last 168 hours. No results for input(s):  PROBNP in the last 8760 hours. Coagulation Profile: No results for input(s): INR, PROTIME in the last 168 hours. Thyroid Function Tests: No results for input(s): TSH, T4TOTAL, FREET4, T3FREE, THYROIDAB in the last 72 hours. Lipid Profile: No results for input(s): CHOL, HDL, LDLCALC, TRIG, CHOLHDL, LDLDIRECT in the last 72 hours. Anemia Panel: No results for input(s): VITAMINB12, FOLATE, FERRITIN, TIBC, IRON, RETICCTPCT in the last 72 hours. Urine analysis:    Component Value Date/Time   COLORURINE YELLOW 10/14/2021 1422   APPEARANCEUR CLOUDY (A) 10/14/2021 1422   LABSPEC 1.016 10/14/2021 1422   PHURINE 5.0 10/14/2021 1422   GLUCOSEU 50 (A) 10/14/2021 1422   HGBUR SMALL (A) 10/14/2021 1422   BILIRUBINUR NEGATIVE 10/14/2021 1422   KETONESUR NEGATIVE 10/14/2021 1422   PROTEINUR 100 (A) 10/14/2021 1422   NITRITE NEGATIVE 10/14/2021 1422   LEUKOCYTESUR NEGATIVE 10/14/2021 1422   Sepsis Labs: Invalid input(s): PROCALCITONIN, [place]  Microbiology: No results found for this or any previous visit (from the past 240 hour(s)).  Radiology Studies: No results found.   Phillips Climes MD [facility]  If 7PM-7AM, please contact night-coverage [email or web address] 11/14/2021, 12:13 PM

## 2021-11-15 ENCOUNTER — Other Ambulatory Visit (HOSPITAL_COMMUNITY): Payer: Self-pay

## 2021-11-15 LAB — GLUCOSE, CAPILLARY
Glucose-Capillary: 185 mg/dL — ABNORMAL HIGH (ref 70–99)
Glucose-Capillary: 126 mg/dL — ABNORMAL HIGH (ref 70–99)

## 2021-11-15 MED ORDER — ALBUTEROL SULFATE (2.5 MG/3ML) 0.083% IN NEBU
2.5000 mg | INHALATION_SOLUTION | RESPIRATORY_TRACT | 1 refills | Status: DC | PRN
  Filled 2021-11-15: qty 90, 5d supply, fill #0

## 2021-11-15 MED ORDER — CARVEDILOL 25 MG PO TABS
25.0000 mg | ORAL_TABLET | Freq: Two times a day (BID) | ORAL | 0 refills | Status: DC
  Filled 2021-11-15: qty 60, 30d supply, fill #0

## 2021-11-15 MED ORDER — HYDRALAZINE HCL 100 MG PO TABS
100.0000 mg | ORAL_TABLET | Freq: Three times a day (TID) | ORAL | 0 refills | Status: DC
  Filled 2021-11-15: qty 90, 30d supply, fill #0

## 2021-11-15 MED ORDER — TRESIBA FLEXTOUCH 200 UNIT/ML ~~LOC~~ SOPN: 32.0000 [IU] | PEN_INJECTOR | Freq: Two times a day (BID) | SUBCUTANEOUS | Status: DC

## 2021-11-15 MED ORDER — PANTOPRAZOLE SODIUM 40 MG PO TBEC
40.0000 mg | DELAYED_RELEASE_TABLET | Freq: Two times a day (BID) | ORAL | 0 refills | Status: DC
  Filled 2021-11-15: qty 60, 30d supply, fill #0

## 2021-11-15 MED ORDER — ATORVASTATIN CALCIUM 20 MG PO TABS
20.0000 mg | ORAL_TABLET | Freq: Every day | ORAL | 0 refills | Status: DC
  Filled 2021-11-15: qty 30, 30d supply, fill #0

## 2021-11-15 MED ORDER — AMLODIPINE BESYLATE 10 MG PO TABS
10.0000 mg | ORAL_TABLET | Freq: Every day | ORAL | 0 refills | Status: DC
Start: 2021-11-16 — End: 2022-02-17
  Filled 2021-11-15: qty 30, 30d supply, fill #0

## 2021-11-15 MED ORDER — ASPIRIN EC 81 MG PO TBEC: 81.0000 mg | DELAYED_RELEASE_TABLET | Freq: Every day | ORAL | 0 refills | Status: DC

## 2021-11-15 MED ORDER — BUDESONIDE 0.25 MG/2ML IN SUSP
0.2500 mg | Freq: Two times a day (BID) | RESPIRATORY_TRACT | 0 refills | Status: DC
  Filled 2021-11-15: qty 120, 30d supply, fill #0

## 2021-11-15 MED ORDER — CLONAZEPAM 0.5 MG PO TABS
0.2500 mg | ORAL_TABLET | Freq: Every day | ORAL | 0 refills | Status: DC
  Filled 2021-11-15: qty 15, 30d supply, fill #0

## 2021-11-15 MED ORDER — LOSARTAN POTASSIUM 50 MG PO TABS
50.0000 mg | ORAL_TABLET | Freq: Every day | ORAL | 0 refills | Status: DC
  Filled 2021-11-15: qty 30, 30d supply, fill #0

## 2021-11-15 NOTE — Plan of Care (Signed)
  Problem: Health Behavior/Discharge Planning: Goal: Ability to manage health-related needs will improve Outcome: Progressing   Problem: Clinical Measurements: Goal: Ability to maintain clinical measurements within normal limits will improve Outcome: Progressing Goal: Will remain free from infection Outcome: Progressing Goal: Diagnostic test results will improve Outcome: Progressing Goal: Respiratory complications will improve Outcome: Progressing Goal: Cardiovascular complication will be avoided Outcome: Progressing   Problem: Activity: Goal: Risk for activity intolerance will decrease Outcome: Progressing   Problem: Nutrition: Goal: Adequate nutrition will be maintained Outcome: Progressing   Problem: Elimination: Goal: Will not experience complications related to bowel motility Outcome: Progressing Goal: Will not experience complications related to urinary retention Outcome: Progressing   Problem: Pain Managment: Goal: General experience of comfort will improve Outcome: Progressing   Problem: Activity: Goal: Ability to tolerate increased activity will improve Outcome: Progressing   Problem: Coping: Goal: Will verbalize positive feelings about self Outcome: Progressing Goal: Will identify appropriate support needs Outcome: Progressing   Problem: Health Behavior/Discharge Planning: Goal: Ability to manage health-related needs will improve Outcome: Progressing   Problem: Self-Care: Goal: Ability to participate in self-care as condition permits will improve Outcome: Progressing Goal: Verbalization of feelings and concerns over difficulty with self-care will improve Outcome: Progressing   Problem: Nutrition: Goal: Risk of aspiration will decrease Outcome: Progressing Goal: Dietary intake will improve Outcome: Progressing   Problem: Intracerebral Hemorrhage Tissue Perfusion: Goal: Complications of Intracerebral Hemorrhage will be minimized Outcome:  Progressing

## 2021-11-15 NOTE — Progress Notes (Signed)
Pt refused RT to suction out trach. Pt's LS are diminished and VSS. Pt in no distress.

## 2021-11-15 NOTE — Progress Notes (Addendum)
SATURATION QUALIFICATIONS: (This note is used to comply with regulatory documentation for home oxygen)  Patient Saturations on Room Air at Rest =  88%  Patient Saturations on Room Air while Ambulating =  88%  Patient Saturations on 5 Liters of oxygen while Ambulating =  98%  Please briefly explain why patient needs home oxygen: Patient unable to maintain appropriate oxygen saturation without supplemental oxygen.

## 2021-11-15 NOTE — TOC Transition Note (Signed)
Transition of Care [facility]) - CM/SW Discharge Note   Patient Details  Name: Maureen Jones MRN: [id number] Date of Birth: [birth year]  Transition of Care [facility]) CM/SW Contact:  Cyndi Bender, RN Phone Number: 11/15/2021, 10:15 AM   Clinical Narrative:    Patient stable for discharge. Orders for Home Health and DME. HH-PT/OT/SLP arranged with [facility]. Notified Seth Bake of discharge today. Zach with [facility] aware of discharge.  Patient's friend, [name], notified of discharge today. Address, phone number and PCP appointment verified.    Final next level of care: [place] Barriers to Discharge: Barriers Resolved   Patient Goals and CMS Choice Patient states their goals for this hospitalization and ongoing recovery are:: return home CMS Medicare.gov Compare Post Acute Care list provided to:: Patient Choice offered to / list presented to : Patient  Discharge Placement  Home with Home Health                     Discharge Plan and Services   Discharge Planning Services: CM Consult Post Acute Care Choice: Home Health, Durable Medical Equipment          DME Arranged: Hospital bed, [place] supplies, 3-N-1, Environmental consultant DME Agency: [facility] Date DME Agency Contacted: 11/10/21 Time DME Agency Contacted: 39 Representative spoke with at DME Agency: [place] (for trach needs) HH Arranged: PT, OT, Speech Therapy HH Agency: [facility] [place] Date [place]: 11/10/21 Time [place]: 1343 Representative spoke with at [place]: [place] ([place]) Interventions     Readmission Risk Interventions Readmission Risk Prevention Plan 11/15/2021  Transportation Screening Complete  PCP or Specialist Appt within 3-5 Days Complete  HRI or [place] Complete  Social Work Consult for [place] Planning/Counseling Patient refused  Palliative Care Screening Not Applicable  Medication Review Press photographer) Complete

## 2021-11-15 NOTE — Progress Notes (Signed)
Patient's saturation 98% on 5 liters when ambulatory

## 2021-11-15 NOTE — Discharge Summary (Signed)
Physician Discharge Summary  Maureen Jones VOH:[id number] DOB: 09-27-1966 DOA: 09/20/2021  PCP: Pcp, No  Admit date: 09/20/2021 Discharge date: 11/15/2021  Admitted From: Home Disposition:  Home   Recommendations for Outpatient Follow-up:  Follow up with PCP Please obtain BMP/CBC in one week [place] up with tracheostomy clinic, please see his scheduled appointment  below  Home Health:YES  Discharge Condition:Stable CODE STATUS:FULL Diet recommendation: Heart Healthy / Carb Modified   Brief/Interim Summary:  55 year old F with PMH of DM-2, CKD-3B, HTN and asthma presented to [facility]  on 10/10 with AMS, hypertensive emergency and acute hypoxic respiratory failure due to VCD/supraglottic and glottic edema with possible asthma exacerbation.  She was extubated on 10/12 but reintubated due to increased work of breathing.  She was extubated again on 10/14 but had to reintubated on 10/25 due to stridor and marked work of breathing.  Eventually, had tracheostomy.  Patient had PEA arrest after dislodged tracheostomy requiring 10 minutes of CPR and 2 mg of epi before ROSC on 11/2.  She had redo tracheostomy with 6 XLT.  She was started on trach collar on 11/8, and tracheostomy downsized to 5 uncuffed proximal XLT Shiley on 11/14.  PCCM following for trach.    Acute respiratory failure with hypoxia due to vocal cord dysfunction/supraglottic and glottic edema and possible asthma exacerbation -Patient did require multiple intubation and extubation, currently trach dependent.   -Trach on 10/25. Downsized to #5 uncuffed trach.  Tolerating PMV.  Weaning oxygen.  Trach management per PCCM, currently on ATC, with 5 L oxygen, able to use PMV, tolerating oral intake, she is to continue to follow-up with tracheostomy clinic as an outpatient, please see appointment scheduled below. -Continue Brovana, Pulmicort and as needed DuoNeb. -Patient denies any respiratory distress, no increased secretions, she is  tolerating her PMV, patient and her family friend being educated about trach care.  She will discharge home with home health.       Uncontrolled DM-2 with hyperglycemia, gastroparesis and other complication: I9S 85%. Last Labs          Recent Labs  Lab 11/13/21 1629 11/13/21 2055 11/14/21 0701 11/14/21 0744 11/14/21 1139  GLUCAP 169* 162* 168* 187* 174*    -Overall her CBG currently controlled on Semglee 32 units twice daily, so her insulin regimen has been adjusted on discharge, and to continue with current regimen .  hyperlipidemia -Continue atorvastatin.   PEA arrest-in the setting of dislodged tracheostomy -ROSC after 10 minutes of CPR and 2 mg of epi.   Uncontrolled hypertension:  -Pressure is acceptable on current regimen, continue with Norvasc, hydralazine, Coreg.  We will resume her home dose losartan at a lower dose.     AKI on CKD-3B/azotemia: Cr seems to be stable from 1.7-2.0. Recent Labs (within last 365 days)              Recent Labs    10/28/21 0059 10/30/21 0015 11/01/21 0030 11/03/21 0057 11/04/21 0139 11/06/21 0116 11/07/21 0102 11/08/21 0125 11/09/21 0351 11/11/21 0249  BUN 33* 29* 26* 22* 24* 29* 30* 29* 28* 28*  CREATININE 1.92* 1.81* 1.81* 1.68* 1.72* 1.89* 2.04* 1.83* 2.02* 2.03*    -Recheck renal function intermittently.   Health Care associated pneumonia:  -Completed 7 days of antibiotics   Agitation-likely ICU delirium.  Seems to have resolved.    Normocytic  anemia: H&H relatively stable.  Anemia panel basically normal. Recent Labs (within last 365 days)  Recent Labs    10/28/21 0059 10/30/21 0015 11/01/21 0030 11/03/21 0057 11/04/21 0139 11/06/21 0116 11/07/21 0102 11/08/21 0125 11/09/21 0351 11/11/21 0249  HGB 8.0* 8.0* 8.1* 8.2* 8.3* 8.4* 8.2* 8.3* 8.2* 8.3*     Hypomagnesemia and hypophosphatemia: Resolved.   Dysphagia/nutrition: Resolved.  Discharge Diagnoses:  Active Problems:   Encephalopathy  acute   Endotracheal tube present   Acute respiratory failure (HCC)   Subglottic edema   Vocal cord dysfunction   Tracheostomy tube present [facility])    Discharge Instructions  Discharge Instructions     Discharge instructions   Complete by: As directed    Follow with Primary MD  in 7 days   Get CBC, CMP, checked  by Primary MD next visit.    Activity: As tolerated with Full fall precautions use walker/cane & assistance as needed   Disposition Home    Diet: Carb modified   On your next visit with your primary care physician please Get Medicines reviewed and adjusted.   Please request your Prim.MD to go over all Hospital Tests and Procedure/Radiological results at the follow up, please get all Hospital records sent to your Prim MD by signing hospital release before you go home.   If you experience worsening of your admission symptoms, develop shortness of breath, life threatening emergency, suicidal or homicidal thoughts you must seek medical attention immediately by calling 911 or calling your MD immediately  if symptoms less severe.  You Must read complete instructions/literature along with all the possible adverse reactions/side effects for all the Medicines you take and that have been prescribed to you. Take any new Medicines after you have completely understood and accpet all the possible adverse reactions/side effects.  Do not drive when taking Pain medications.    Do not take more than prescribed Pain, Sleep and Anxiety Medications  Special Instructions: If you have smoked or chewed Tobacco  in the last 2 yrs please stop smoking, stop any regular Alcohol  and or any Recreational drug use.  Wear Seat belts while driving.   Please note  You were cared for by a hospitalist during your hospital stay. If you have any questions about your discharge medications or the care you received while you were in the hospital after you are discharged, you can call the unit and  asked to speak with the hospitalist on call if the hospitalist that took care of you is not available. Once you are discharged, your primary care physician will handle any further medical issues. Please note that NO REFILLS for any discharge medications will be authorized once you are discharged, as it is imperative that you return to your primary care physician (or establish a relationship with a primary care physician if you do not have one) for your aftercare needs so that they can reassess your need for medications and monitor your lab values.   Increase activity slowly   Complete by: As directed    No wound care   Complete by: As directed       Allergies as of 11/15/2021   No Known Allergies      Medication List     STOP taking these medications    losartan-hydrochlorothiazide 100-25 MG tablet Commonly known as: HYZAAR       TAKE these medications    albuterol (2.5 MG/3ML) 0.083% nebulizer solution Commonly known as: PROVENTIL Use 1 vial (2.5 mg total) by nebulization every 4 (four) hours as needed for wheezing or shortness of breath.  amLODipine 10 MG tablet Commonly known as: NORVASC Take 1 tablet (10 mg total) by mouth daily. Start taking on: November 16, 2021   aspirin EC 81 MG tablet Take 1 tablet (81 mg total) by mouth daily. Swallow whole.   atorvastatin 20 MG tablet Commonly known as: LIPITOR Take 1 tablet (20 mg total) by mouth daily. Start taking on: November 16, 2021   budesonide 0.25 MG/2ML nebulizer solution Commonly known as: PULMICORT Use 1 vial (0.25 mg total) by nebulization 2 (two) times daily.   carvedilol 25 MG tablet Commonly known as: COREG Take 1 tablet (25 mg total) by mouth 2 (two) times daily.   clonazePAM 0.5 MG tablet Commonly known as: KLONOPIN Take 1/2 tablet (0.25 mg total) by mouth daily at bedtime.   DULoxetine 60 MG capsule Commonly known as: CYMBALTA Take 60 mg by mouth daily.   hydrALAZINE 100 MG tablet Commonly known  as: APRESOLINE Take 1 tablet (100 mg total) by mouth 3 (three) times daily. What changed: when to take this   losartan 50 MG tablet Commonly known as: Cozaar Take 1 tablet (50 mg total) by mouth daily.   pantoprazole 40 MG tablet Commonly known as: PROTONIX Take 1 tablet (40 mg total) by mouth 2 (two) times daily.   Tyler Aas FlexTouch 200 UNIT/ML FlexTouch Pen Generic drug: insulin degludec Inject 32 Units into the skin 2 (two) times daily. What changed:  how much to take when to take this               Durable Medical Equipment  (From admission, onward)           Start     Ordered   11/15/21 1215  For home use only DME oxygen  Once       Comments: Via tracheostomy collar  Question Answer Comment  Length of Need 12 Months   Mode or (Route) Mask   Liters per Minute 5   Frequency Continuous (stationary and portable oxygen unit needed)   Oxygen conserving device Yes   Oxygen delivery system Gas      11/15/21 1215   11/10/21 1004  For home use only DME Trach supplies  (For Home Use Only DME Trach Supplies)  Once       Question Answer Comment  Trach Type Shiley   Cuffed or Uncuffed Uncuffed   Fenestrated No   Size 5   XLT Length Yes   Proximal or Distal Proximal   Back Up Trach Type Shiley   Cuffed or Uncuffed Uncuffed   Fenestrated No   Size 5   XLT Length Yes   Proximal or Distal Proximal   Trach Supplies/Equipment for Home Use Tube Holder/Collar   Trach Supplies/Equipment for Home Use Tracheostomy Drain Dressings   Trach Supplies/Equipment for Home Use Medical Suction Machine   Trach Supplies/Equipment for Home Use Suction Catheters   Trach Supplies/Equipment for Home Use Tracheostomy Care Cleaning Kits   Trach Supplies/Equipment for Home Use Speaking Valve   Trach Supplies/Equipment for Home Use HME Device   Trach Supplies/Equipment for Home Use Humidification (oxygen 5 liters via trach collar to provide specified % - if tracheostomy is capped with  occlusive cap during day, a separate  order will need to be provided)   Suction Catheter Size 12 French (for size 5)   Cleaning Kits 60   Oxygen % 30%      11/10/21 1004   11/01/21 1434  For home use only DME Hospital bed  Once  Question Answer Comment  Length of Need 6 Months   The above medical condition requires: Patient requires the ability to reposition frequently   Head must be elevated greater than: 45 degrees   Bed type Semi-electric      11/01/21 1433   10/29/21 1543  For home use only DME 3 n 1  Once        10/29/21 1542   10/29/21 1543  For home use only DME Walker rolling  Once       Question Answer Comment  Walker: With [place]   Patient needs a walker to treat with the following condition Weakness      10/29/21 1542            Follow-up Information     Nche, Charlene Brooke, NP Follow up.   Specialty: Internal Medicine Why: TIME : 10:30 AM DATE: DECEMBER 12 , 2022 DOCTOR Wilfred Lacy PHONE #  [phone] Contact information: [place] [address] [place] [place] Follow up.   Specialty: Home Health Services Why: HH-PT/OT/SLP arranged. They will contact you to schedule apt. Contact information: [place] [address] [place] [facility] Follow up.   Contact information: [place] [address] [facility] with Marni Griffon, ACNP Follow up on 11/30/2021.   Why: Go to admissions for check in, appointment at 1pm at [facility].    You will need to complete COVID screening on December 16th at [address]  The clinic is from 8-3, no appointment needed > drive to the back of the building and you do not even have to get out the car.  Pull up to the blue canopy. Contact information: [facility]               No Known  Allergies  Consultations: Pulmonology Cardiology   Procedures/Studies: DG Abd Portable 1V  Result Date: 10/20/2021 CLINICAL DATA:  Abdominal distension EXAM: PORTABLE ABDOMEN - 1 VIEW COMPARISON:  Abdominal x-ray 10/16/2019 FINDINGS: Enteric tube is been removed since previous study. Overall paucity of bowel gas with no distended gas-filled loops of bowel identified to suggest obstruction. There is residual oral contrast seen in the colon. IMPRESSION: No definite acute process identified. Electronically Signed   By: Ofilia Neas M.D.   On: 10/20/2021 12:15   DG Swallowing Func-Speech Pathology  Result Date: 10/19/2021 Table formatting from the original result was not included. Objective Swallowing Evaluation: Type of Study: MBS-Modified Barium Swallow Study  Patient Details Name: Almyra Birman MRN: [id number] Date of Birth: 02/10/1966 Today's Date: 10/19/2021 Time: SLP Start Time (ACUTE ONLY): 1502 -SLP Stop Time (ACUTE ONLY): 1523 SLP Time Calculation (min) (ACUTE ONLY): 21 min Past Medical History: Past Medical History: Diagnosis Date  Asthma   Diabetes ([place])   Glottic and subglottic edema 2022  Hypertension  Past Surgical History: No past surgical history on file. HPI: Pt is a 55 y/o female admitted with AMS 10/10. Stroke w/u negative, dx with acute metabolic encephalopathy in the setting of hypertensive emergency. ETT 10/10-10/12; reintubated 10/12 d/t increased WOB, not mobilizing secretions, stridor, received steroids, extubated 10/14 with lingering stridor, which was improving 10/15. ENT scoped pt 10/18: "Edema of the glottis and subglottic larynx following recent intubation and then repeat  intubation.  There is no evidence of cord paralysis or any tumor present.  There is currently no stridor.  Allow more time for resolution of the swelling.  Unfortunately she is at risk for developing subglottic stenosis." SLP followed pt 10/15-10/24. MBS 10/19: trace posterior spillage, mild pharyngeal  delay, trace transient penetration; regular texture diet with thin liquids recommended and SLP ultimately signed off with pt demonstrating adequate tolerance. 10/25: Stridor again noted; pt intubated and trach placed.  MBS 10/27: oropharyngeal dysphagia characterized by reduced bolus cohesion, reduced hyolaryngeal elevation, reduced anterior laryngeal movement, a pharyngeal delay, penetration (PAS 3, 5) with thin liquids. A dyspahgia 3 diet with nectar thick liquids was initiated and she was subsequently advanced to regular and thin liquids on 10/30. Pt with respiratory distress from disloged trach on 11/2 leading to PEA arrest with CPR x 10 min. ETT via stoma. Trach revised 11/2; #6XLT distal.  Cortrak placed 11/4. Trach changed from 6XL distal to 6XL proximal 11/5.  Subjective: Pt seen in radiology. No family present  Recommendations for follow up therapy are one component of a multi-disciplinary discharge planning process, led by the attending physician.  Recommendations may be updated based on patient status, additional functional criteria and insurance authorization. Assessment / Plan / Recommendation Clinical Impressions 10/19/2021 Clinical Impression Pt with trach collar was seen for an MBS. MBS completed without PMV d/t pt inability to tolerate valve this date. Overall, pt presents with mild pharyngeal dysphagia resulting in penetration above the level of the vocal folds which does not clear spontaneously (PAS 3) with small and large sips of thin liquid secondary to delayed swallow initation at the level of the vallecula resulting in impaired swallow timing. Pt additionally exhibited reduced anterior laryngeal mobility and laryngeal elevation however this did not appear to diminish airway protection. Across consistencies, pt exhibited no oral phase impairments. Pharyngeally, pt exhibited aforementioned penetration during swallow with small and large sips of thin liquid however exhibited no impairments with  nectar thick liquid or regular solids. SLP unable to test pt for stimulability of compensatory strategies (throat clear, breath holding) d/t current trach without PMV status. Recommend Regular/Nectar thick liquid diet, administering medications whole in puree. SLP will continue to follow pt for management of diet tolerance and upgraded PO trials as appropriate. SLP Visit Diagnosis Dysphagia, pharyngeal phase (R13.13) Attention and concentration deficit following -- Frontal lobe and executive function deficit following -- Impact on safety and function Mild aspiration risk   Treatment Recommendations 10/19/2021 Treatment Recommendations Therapy as outlined in treatment plan below   Prognosis 10/19/2021 Prognosis for Safe Diet Advancement Good Barriers to Reach Goals -- Barriers/Prognosis Comment -- Diet Recommendations 10/19/2021 SLP Diet Recommendations Regular solids;Nectar thick liquid Liquid Administration via Straw;Cup Medication Administration Whole meds with puree Compensations Slow rate;Small sips/bites Postural Changes Remain semi-upright after after feeds/meals (Comment);Seated upright at 90 degrees   Other Recommendations 10/19/2021 Recommended Consults -- Oral Care Recommendations Oral care BID;Patient independent with oral care Other Recommendations Order thickener from pharmacy Follow Up Recommendations Other (comment) Assistance recommended at discharge PRN Functional Status Assessment Patient has had a recent decline in their functional status and demonstrates the ability to make significant improvements in function in a reasonable and predictable amount of time. Frequency and Duration  10/19/2021 Speech Therapy Frequency (ACUTE ONLY) min 2x/week Treatment Duration 2 weeks   Oral Phase 10/19/2021 Oral Phase WFL Oral - Pudding Teaspoon -- Oral - Pudding Cup -- Oral - Honey Teaspoon -- Oral - Honey Cup -- Oral -  Nectar Teaspoon -- Oral - Nectar Cup WFL Oral - Nectar Straw WFL Oral - Thin Teaspoon -- Oral -  Thin Cup WFL Oral - Thin Straw NT Oral - Puree NT Oral - Mech Soft NT Oral - Regular WFL Oral - Multi-Consistency -- Oral - Pill -- Oral Phase - Comment --  Pharyngeal Phase 10/19/2021 Pharyngeal Phase Impaired Pharyngeal- Pudding Teaspoon -- Pharyngeal -- Pharyngeal- Pudding Cup -- Pharyngeal -- Pharyngeal- Honey Teaspoon -- Pharyngeal -- Pharyngeal- Honey Cup -- Pharyngeal -- Pharyngeal- Nectar Teaspoon -- Pharyngeal -- Pharyngeal- Nectar Cup WFL;Reduced anterior laryngeal mobility;Reduced laryngeal elevation Pharyngeal -- Pharyngeal- Nectar Straw WFL;Reduced anterior laryngeal mobility;Reduced laryngeal elevation Pharyngeal -- Pharyngeal- Thin Teaspoon -- Pharyngeal -- Pharyngeal- Thin Cup Penetration/Aspiration during swallow;Delayed swallow initiation-vallecula;Reduced anterior laryngeal mobility;Reduced laryngeal elevation Pharyngeal Material enters airway, remains ABOVE vocal cords and not ejected out Pharyngeal- Thin Straw NT Pharyngeal -- Pharyngeal- Puree NT Pharyngeal -- Pharyngeal- Mechanical Soft -- Pharyngeal -- Pharyngeal- Regular WFL;Reduced anterior laryngeal mobility;Reduced laryngeal elevation Pharyngeal -- Pharyngeal- Multi-consistency -- Pharyngeal -- Pharyngeal- Pill NT Pharyngeal -- Pharyngeal Comment --  Cervical Esophageal Phase  10/07/2021 Cervical Esophageal Phase WFL Pudding Teaspoon -- Pudding Cup -- Honey Teaspoon -- Honey Cup -- Nectar Teaspoon -- Nectar Cup -- Nectar Straw -- Thin Teaspoon -- Thin Cup -- Thin Straw -- Puree -- Mechanical Soft -- Regular -- Multi-consistency -- Pill -- Cervical Esophageal Comment -- Houston Siren 10/19/2021, 3:56 PM                        Subjective: No significant events overnight, she denies any complaints, she is excited about going home today.  Discharge Exam: Vitals:   11/15/21 1200 11/15/21 1218  BP:  (!) 153/78  Pulse:  84  Resp:  15  Temp:    SpO2: (!) 83% 100%   Vitals:   11/15/21 1139 11/15/21 1153 11/15/21 1200 11/15/21  1218  BP:    (!) 153/78  Pulse: 85   84  Resp: 18   15  Temp:      TempSrc:    Oral  SpO2: 100% 98% (!) 83% 100%  Weight:      Height:        General: Pt is alert, awake, not in acute distress,trach + Cardiovascular: RRR, S1/S2 +, no rubs, no gallops Respiratory: CTA bilaterally, no wheezing, no rhonchi Abdominal: Soft, NT, ND, bowel sounds + Extremities: no edema, no cyanosis    The results of significant diagnostics from this hospitalization (including imaging, microbiology, ancillary and laboratory) are listed below for reference.     Microbiology: No results found for this or any previous visit (from the past 240 hour(s)).   Labs: BNP (last 3 results) Recent Labs    10/01/21 0438  BNP 98.9   Basic Metabolic Panel: Recent Labs  Lab 11/09/21 0351 11/11/21 0249  NA 139 139  K 4.1 4.2  CL 103 104  CO2 29 27  GLUCOSE 176* 186*  BUN 28* 28*  CREATININE 2.02* 2.03*  CALCIUM 9.9 9.9  MG 1.9  --   PHOS 3.8  --    Liver Function Tests: Recent Labs  Lab 11/09/21 0351  ALBUMIN 2.6*   No results for input(s): LIPASE, AMYLASE in the last 168 hours. No results for input(s): AMMONIA in the last 168 hours. CBC: Recent Labs  Lab 11/09/21 0351 11/11/21 0249  WBC  --  9.1  HGB 8.2* 8.3*  HCT 27.3* 27.0*  MCV  --  95.7  PLT  --  211   Cardiac Enzymes: No results for input(s): CKTOTAL, CKMB, CKMBINDEX, TROPONINI in the last 168 hours. BNP: Invalid input(s): POCBNP CBG: Recent Labs  Lab 11/14/21 1139 11/14/21 1534 11/14/21 2049 11/15/21 0810 11/15/21 1221  GLUCAP 174* 142* 135* 185* 126*   D-Dimer No results for input(s): DDIMER in the last 72 hours. Hgb A1c No results for input(s): HGBA1C in the last 72 hours. Lipid Profile No results for input(s): CHOL, HDL, LDLCALC, TRIG, CHOLHDL, LDLDIRECT in the last 72 hours. Thyroid function studies No results for input(s): TSH, T4TOTAL, T3FREE, THYROIDAB in the last 72 hours.  Invalid input(s):  FREET3 Anemia work up No results for input(s): VITAMINB12, FOLATE, FERRITIN, TIBC, IRON, RETICCTPCT in the last 72 hours. Urinalysis    Component Value Date/Time   COLORURINE YELLOW 10/14/2021 1422   APPEARANCEUR CLOUDY (A) 10/14/2021 1422   LABSPEC 1.016 10/14/2021 1422   PHURINE 5.0 10/14/2021 1422   GLUCOSEU 50 (A) 10/14/2021 1422   HGBUR SMALL (A) 10/14/2021 1422   BILIRUBINUR NEGATIVE 10/14/2021 1422   KETONESUR NEGATIVE 10/14/2021 1422   PROTEINUR 100 (A) 10/14/2021 1422   NITRITE NEGATIVE 10/14/2021 1422   LEUKOCYTESUR NEGATIVE 10/14/2021 1422   Sepsis Labs Invalid input(s): PROCALCITONIN,  WBC,  LACTICIDVEN Microbiology No results found for this or any previous visit (from the past 240 hour(s)).   Time coordinating discharge: Over 30 minutes  SIGNED:   Phillips Climes, MD  [facility] 11/15/2021, 3:40 PM Pager   If 7PM-7AM, please contact night-coverage [email or web address] Password [email or web address]

## 2021-11-15 NOTE — Progress Notes (Signed)
Patient desaturates in the low 80's on room air

## 2021-11-15 NOTE — Discharge Instructions (Signed)
Follow with Primary MD  in 7 days   Get CBC, CMP, checked  by Primary MD next visit.    Activity: As tolerated with Full fall precautions use walker/cane & assistance as needed   Disposition Home    Diet: Carb modified   On your next visit with your primary care physician please Get Medicines reviewed and adjusted.   Please request your Prim.MD to go over all Hospital Tests and Procedure/Radiological results at the follow up, please get all Hospital records sent to your Prim MD by signing hospital release before you go home.   If you experience worsening of your admission symptoms, develop shortness of breath, life threatening emergency, suicidal or homicidal thoughts you must seek medical attention immediately by calling 911 or calling your MD immediately  if symptoms less severe.  You Must read complete instructions/literature along with all the possible adverse reactions/side effects for all the Medicines you take and that have been prescribed to you. Take any new Medicines after you have completely understood and accpet all the possible adverse reactions/side effects.  Do not drive when taking Pain medications.    Do not take more than prescribed Pain, Sleep and Anxiety Medications  Special Instructions: If you have smoked or chewed Tobacco  in the last 2 yrs please stop smoking, stop any regular Alcohol  and or any Recreational drug use.  Wear Seat belts while driving.   Please note  You were cared for by a hospitalist during your hospital stay. If you have any questions about your discharge medications or the care you received while you were in the hospital after you are discharged, you can call the unit and asked to speak with the hospitalist on call if the hospitalist that took care of you is not available. Once you are discharged, your primary care physician will handle any further medical issues. Please note that NO REFILLS for any discharge medications will be authorized  once you are discharged, as it is imperative that you return to your primary care physician (or establish a relationship with a primary care physician if you do not have one) for your aftercare needs so that they can reassess your need for medications and monitor your lab values.

## 2021-11-16 ENCOUNTER — Telehealth: Payer: Self-pay

## 2021-11-16 NOTE — Telephone Encounter (Signed)
Informed by Micheline Maze, RN CM covering yesterday when patient [place], that caregiver [name] LM requesting callback due to running out of trach supplies. Spoke w [name] and she states that  O2 Compressor is not functioning. She states she notified [employer] of this. She is currently using tanks 4 of her 6 oxygen tanks are empty. She has one suction catheter left that was given to her by EMS last night after she had to call them to suction patient because she ran out of supplies she was sent home with. She does not have any more disposable cannulas.  Spoke with [employer] hospital liaison, Andree Coss, who states that [place] [facility] met with patient and caregiver yesterday evening after she arrived home. Supplies for home were ordered and will be there Thursday (2 days). Zach notified of the above and EMS call made by [name] last night. He will ensure she get supplies today and that equipment is functional.   Spoke w [facility] liaison. Seth Bake confirms that they will call patient today and she will speak with office manager to expedite her first home health visit to possibly today, at the latest tomorrow.   [name] instructed that [employer] will follow up with her for delivery of additional supplies today. [name] instruction for any airway difficulties to call 911 as she did last night.

## 2021-11-19 ENCOUNTER — Telehealth: Payer: Self-pay

## 2021-11-19 NOTE — Telephone Encounter (Signed)
Sonia Baller a Home Health Speech Therapist called to let us know Maureen Jones's BP is 180/75 today. Also requesting a Education officer, museum for Pt.   Pt will be seen as a New Pt with [place] 12/12.   Sonia Baller: [phone]

## 2021-11-22 ENCOUNTER — Other Ambulatory Visit: Payer: Self-pay

## 2021-11-22 ENCOUNTER — Ambulatory Visit (INDEPENDENT_AMBULATORY_CARE_PROVIDER_SITE_OTHER): Payer: Medicare Other | Admitting: Nurse Practitioner

## 2021-11-22 ENCOUNTER — Encounter: Payer: Self-pay | Admitting: Nurse Practitioner

## 2021-11-22 VITALS — BP 148/72 | HR 82 | Temp 97.2°F | Wt 231.8 lb

## 2021-11-22 DIAGNOSIS — N184 Chronic kidney disease, stage 4 (severe): Secondary | ICD-10-CM | POA: Diagnosis not present

## 2021-11-22 DIAGNOSIS — N185 Chronic kidney disease, stage 5: Secondary | ICD-10-CM | POA: Insufficient documentation

## 2021-11-22 DIAGNOSIS — Z794 Long term (current) use of insulin: Secondary | ICD-10-CM

## 2021-11-22 DIAGNOSIS — Z9981 Dependence on supplemental oxygen: Secondary | ICD-10-CM

## 2021-11-22 DIAGNOSIS — F39 Unspecified mood [affective] disorder: Secondary | ICD-10-CM | POA: Insufficient documentation

## 2021-11-22 DIAGNOSIS — F411 Generalized anxiety disorder: Secondary | ICD-10-CM

## 2021-11-22 DIAGNOSIS — J9601 Acute respiratory failure with hypoxia: Secondary | ICD-10-CM

## 2021-11-22 DIAGNOSIS — Z930 Tracheostomy status: Secondary | ICD-10-CM

## 2021-11-22 DIAGNOSIS — E1165 Type 2 diabetes mellitus with hyperglycemia: Secondary | ICD-10-CM

## 2021-11-22 DIAGNOSIS — I10 Essential (primary) hypertension: Secondary | ICD-10-CM

## 2021-11-22 LAB — BASIC METABOLIC PANEL
BUN: 35 mg/dL — ABNORMAL HIGH (ref 6–23)
CO2: 27 mEq/L (ref 19–32)
Calcium: 9.9 mg/dL (ref 8.4–10.5)
Chloride: 106 mEq/L (ref 96–112)
Creatinine, Ser: 2.26 mg/dL — ABNORMAL HIGH (ref 0.40–1.20)
GFR: 24.04 mL/min — ABNORMAL LOW (ref >60.00)
Glucose, Bld: 149 mg/dL — ABNORMAL HIGH (ref 70–99)
Potassium: 4.3 mEq/L (ref 3.5–5.1)
Sodium: 143 mEq/L (ref 135–145)

## 2021-11-22 LAB — CBC WITH DIFFERENTIAL/PLATELET
Basophils Absolute: 0.1 10*3/uL (ref 0.0–0.1)
Basophils Relative: 0.5 % (ref 0.0–3.0)
Eosinophils Absolute: 0.1 10*3/uL (ref 0.0–0.7)
Eosinophils Relative: 1.5 % (ref 0.0–5.0)
HCT: 29.3 % — ABNORMAL LOW (ref 36.0–46.0)
Hemoglobin: 9.3 g/dL — ABNORMAL LOW (ref 12.0–15.0)
Lymphocytes Relative: 18.8 % (ref 12.0–46.0)
Lymphs Abs: 1.9 10*3/uL (ref 0.7–4.0)
MCHC: 31.7 g/dL (ref 30.0–36.0)
MCV: 92 fl (ref 78.0–100.0)
Monocytes Absolute: 0.7 10*3/uL (ref 0.1–1.0)
Monocytes Relative: 7 % (ref 3.0–12.0)
Neutro Abs: 7.3 10*3/uL (ref 1.4–7.7)
Neutrophils Relative %: 72.2 % (ref 43.0–77.0)
Platelets: 230 10*3/uL (ref 150.0–400.0)
RBC: 3.18 Mil/uL — ABNORMAL LOW (ref 3.87–5.11)
RDW: 15.5 % (ref 11.5–15.5)
WBC: 10.1 10*3/uL (ref 4.0–10.5)

## 2021-11-22 MED ORDER — INSULIN ASPART 100 UNIT/ML IJ SOLN: INTRAMUSCULAR | 2 refills | Status: DC

## 2021-11-22 MED ORDER — DULOXETINE HCL 60 MG PO CPEP: 60.0000 mg | ORAL_CAPSULE | Freq: Every day | ORAL | 0 refills | Status: DC

## 2021-11-22 MED ORDER — TRESIBA FLEXTOUCH 200 UNIT/ML ~~LOC~~ SOPN: 32.0000 [IU] | PEN_INJECTOR | Freq: Two times a day (BID) | SUBCUTANEOUS | 1 refills | Status: DC

## 2021-11-22 NOTE — Patient Instructions (Addendum)
Go to lab for blood draw and urine collection  Call office with name of glucometer strip.  Take amlodipine at bedtime. Resume cymbalta  Take tresiba and novolog as prescribed Monitor glucose TID before meals  Maintain appt with trach clinic Schedule appt with pulmonology  You will be contacted to schedule appt with nephrology.

## 2021-11-22 NOTE — Assessment & Plan Note (Addendum)
Uncontrolled with novolog 16units TID and tresiba 120units daily Home Glucose:107-159 One episode of hypoglycemia since hospital discharge: 49. Last hgbA1c of 12% during hospitalization. Hx of CKD-4 and neuropathy  Changed tresiba dose to 32unit BID as prescribed after hospitalization. Adjusted novolg insulin to Sliding scale. New rx sent. F/up in 18month with glucose reading.

## 2021-11-22 NOTE — Progress Notes (Signed)
Subjective:  Patient ID: Maureen Jones, female    DOB: 04-Jan-1966  Age: 55 y.o. MRN: [id number]  CC: Establish Care (New patient/Medication refills needed (Insulin-Tresiba and Novolog). Pt would also like a order for a portable oxygen tank. /Declines vaccines at this time. )  HPI No previous pcp. Moved from [place] Accompanied by friend  Essential hypertension Elevated today Reports medication compliance but concerned about LE edema due to amlodipine in AM. Also take coreg, hydralazine, and losartan. Food is prepared by her friend BP Readings from Last 3 Encounters:  11/22/21 (!) 148/72  11/15/21 (!) 153/78   Advised to take amlodipine in PM Maintain DASh diet Use knee high compression stocking Repeat BMP and BNP Maintain med doses Refills sent  DM (diabetes mellitus) ([place]) Uncontrolled with novolog 16units TID and tresiba 120units daily Home Glucose:107-159 One episode of hypoglycemia since hospital discharge: 66. Last hgbA1c of 12% during hospitalization. Hx of CKD-4 and neuropathy  Changed tresiba dose to 32unit BID as prescribed after hospitalization. Adjusted novolg insulin to Sliding scale. New rx sent. F/up in 90month with glucose reading. Reviewed past Medical, Social and Family history today.  Outpatient Medications Prior to Visit  Medication Sig Dispense Refill   albuterol (PROVENTIL) (2.5 MG/3ML) 0.083% nebulizer solution Use 1 vial (2.5 mg total) by nebulization every 4 (four) hours as needed for wheezing or shortness of breath. 90 mL 1   amLODipine (NORVASC) 10 MG tablet Take 1 tablet (10 mg total) by mouth daily. 30 tablet 0   aspirin EC 81 MG tablet Take 1 tablet (81 mg total) by mouth daily. Swallow whole. 30 tablet 0   atorvastatin (LIPITOR) 20 MG tablet Take 1 tablet (20 mg total) by mouth daily. 30 tablet 0   budesonide (PULMICORT) 0.25 MG/2ML nebulizer solution Use 1 vial (0.25 mg total) by nebulization 2 (two) times daily. 120 mL 0   carvedilol  (COREG) 25 MG tablet Take 1 tablet (25 mg total) by mouth 2 (two) times daily. 60 tablet 0   clonazePAM (KLONOPIN) 0.5 MG tablet Take 1/2 tablet (0.25 mg total) by mouth daily at bedtime. 15 tablet 0   hydrALAZINE (APRESOLINE) 100 MG tablet Take 1 tablet (100 mg total) by mouth 3 (three) times daily. 90 tablet 0   losartan (COZAAR) 50 MG tablet Take 1 tablet (50 mg total) by mouth daily. 30 tablet 0   pantoprazole (PROTONIX) 40 MG tablet Take 1 tablet (40 mg total) by mouth 2 (two) times daily. 60 tablet 0   DULoxetine (CYMBALTA) 60 MG capsule Take 60 mg by mouth daily.     insulin aspart (NOVOLOG) 100 UNIT/ML injection Inject into the skin 3 (three) times daily before meals.     TRESIBA FLEXTOUCH 200 UNIT/ML FlexTouch Pen Inject 32 Units into the skin 2 (two) times daily.     No facility-administered medications prior to visit.   ROS See HPI  Objective:  BP (!) 148/72 (BP Location: Left Arm, Patient Position: Sitting, Cuff Size: Large)   Pulse 82   Temp (!) 97.2 F (36.2 C) (Temporal)   Wt 231 lb 12.8 oz (105.1 kg)   SpO2 100% Comment: With 3L of oxygen  BMI 37.41 kg/m   Physical Exam Cardiovascular:     Rate and Rhythm: Normal rate and regular rhythm.     Pulses: Normal pulses.     Heart sounds: Normal heart sounds.  Pulmonary:     Effort: Pulmonary effort is normal.     Breath sounds: Normal breath sounds.  Musculoskeletal:     Right lower leg: Edema present.     Left lower leg: Edema present.  Skin:    Findings: No erythema.  Neurological:     Mental Status: She is alert and oriented to person, place, and time.  Psychiatric:        Mood and Affect: Mood normal.        Behavior: Behavior normal.        Thought Content: Thought content normal.    Assessment & Plan:  This visit occurred during the SARS-CoV-2 public health emergency.  Safety protocols were in place, including screening questions prior to the visit, additional usage of staff PPE, and extensive cleaning of  exam room while observing appropriate contact time as indicated for disinfecting solutions.   Maureen Jones was seen today for establish care.  Diagnoses and all orders for this visit:  Essential hypertension -     Basic metabolic panel  Tracheostomy tube present ([place]) -     Cancel: For home use only DME Other see comment -     For home use only DME Other see comment  Type 2 diabetes mellitus with hyperglycemia, with long-term current use of insulin (HCC) -     insulin aspart (NOVOLOG) 100 UNIT/ML injection; Insulin (Novolog) sliding scale for additional Novolog: administer 41minutes before meals. Sugar <150 - no units Sugar >151-200 - 4 units Sugar >200-250 - 6 units Sugar >251-300 - 10 units and call office. -     TRESIBA FLEXTOUCH 200 UNIT/ML FlexTouch Pen; Inject 32 Units into the skin 2 (two) times daily. -     Cancel: Microalbumin / creatinine urine ratio -     Microalbumin / creatinine urine ratio; Future  CKD (chronic kidney disease) stage 4, GFR 15-29 ml/min (HCC) -     CBC with Differential/Platelet -     Ambulatory referral to Nephrology -     Cancel: Microalbumin / creatinine urine ratio -     Microalbumin / creatinine urine ratio; Future  GAD (generalized anxiety disorder) -     DULoxetine (CYMBALTA) 60 MG capsule; Take 1 capsule (60 mg total) by mouth daily.  Supplemental oxygen dependent -     Cancel: For home use only DME Other see comment -     For home use only DME Other see comment  Acute respiratory failure with hypoxia ([place]) -     For home use only DME Other see comment   Problem List Items Addressed This Visit       Cardiovascular and Mediastinum   Essential hypertension - Primary    Elevated today Reports medication compliance but concerned about LE edema due to amlodipine in AM. Also take coreg, hydralazine, and losartan. Food is prepared by her friend BP Readings from Last 3 Encounters:  11/22/21 (!) 148/72  11/15/21 (!) 153/78   Advised to  take amlodipine in PM Maintain DASh diet Use knee high compression stocking Repeat BMP and BNP Maintain med doses Refills sent      Relevant Orders   Basic metabolic panel (Completed)     Respiratory   Acute respiratory failure (HCC)   Relevant Orders   For home use only DME Other see comment     Endocrine   DM (diabetes mellitus) ([place])    Uncontrolled with novolog 16units TID and tresiba 120units daily Home Glucose:107-159 One episode of hypoglycemia since hospital discharge: 49. Last hgbA1c of 12% during hospitalization. Hx of CKD-4 and neuropathy  Changed tresiba dose to 32unit  BID as prescribed after hospitalization. Adjusted novolg insulin to Sliding scale. New rx sent. F/up in 54month with glucose reading.      Relevant Medications   insulin aspart (NOVOLOG) 100 UNIT/ML injection   TRESIBA FLEXTOUCH 200 UNIT/ML FlexTouch Pen   Other Relevant Orders   Microalbumin / creatinine urine ratio     Genitourinary   CKD (chronic kidney disease) stage 4, GFR 15-29 ml/min (HCC)   Relevant Orders   CBC with Differential/Platelet (Completed)   Ambulatory referral to Nephrology   Microalbumin / creatinine urine ratio     Other   Mood disorder (HCC)   Relevant Medications   DULoxetine (CYMBALTA) 60 MG capsule   Tracheostomy tube present ([place])   Relevant Orders   For home use only DME Other see comment   Other Visit Diagnoses     Supplemental oxygen dependent       Relevant Orders   For home use only DME Other see comment       Follow-up: Return in about 4 weeks (around 12/20/2021) for DM and HTN.  Wilfred Lacy, NP

## 2021-11-22 NOTE — Assessment & Plan Note (Signed)
Elevated today Reports medication compliance but concerned about LE edema due to amlodipine in AM. Also take coreg, hydralazine, and losartan. Food is prepared by her friend BP Readings from Last 3 Encounters:  11/22/21 (!) 148/72  11/15/21 (!) 153/78   Advised to take amlodipine in PM Maintain DASh diet Use knee high compression stocking Repeat BMP and BNP Maintain med doses Refills sent

## 2021-11-23 ENCOUNTER — Telehealth: Payer: Self-pay | Admitting: Nurse Practitioner

## 2021-11-24 NOTE — Telephone Encounter (Signed)
Confirmed the correct company the patient is using for portable oxygen tanks, and directed her to reach out to them in regards to trach tools needed for patient.

## 2021-11-24 NOTE — Telephone Encounter (Addendum)
Issues addressed at appointment on 11/22/21

## 2021-11-29 ENCOUNTER — Telehealth: Payer: Self-pay | Admitting: Nurse Practitioner

## 2021-11-30 ENCOUNTER — Inpatient Hospital Stay (HOSPITAL_COMMUNITY): Admit: 2021-11-30 | Payer: Medicare Other

## 2021-12-01 NOTE — Telephone Encounter (Signed)
LVM for patient to return call. 

## 2021-12-06 ENCOUNTER — Inpatient Hospital Stay (HOSPITAL_COMMUNITY)
Admission: EM | Admit: 2021-12-06 | Discharge: 2021-12-17 | DRG: 166 | Disposition: A | Discharge status: Home / Self Care | Payer: Medicare Other | Attending: Family Medicine | Admitting: Family Medicine

## 2021-12-06 ENCOUNTER — Other Ambulatory Visit: Payer: Self-pay

## 2021-12-06 ENCOUNTER — Emergency Department (HOSPITAL_COMMUNITY): Payer: Medicare Other

## 2021-12-06 DIAGNOSIS — Z7951 Long term (current) use of inhaled steroids: Secondary | ICD-10-CM

## 2021-12-06 DIAGNOSIS — J13 Pneumonia due to Streptococcus pneumoniae: Secondary | ICD-10-CM | POA: Diagnosis present

## 2021-12-06 DIAGNOSIS — Z794 Long term (current) use of insulin: Secondary | ICD-10-CM

## 2021-12-06 DIAGNOSIS — I129 Hypertensive chronic kidney disease with stage 1 through stage 4 chronic kidney disease, or unspecified chronic kidney disease: Secondary | ICD-10-CM | POA: Diagnosis present

## 2021-12-06 DIAGNOSIS — J9811 Atelectasis: Secondary | ICD-10-CM | POA: Diagnosis not present

## 2021-12-06 DIAGNOSIS — R0902 Hypoxemia: Secondary | ICD-10-CM

## 2021-12-06 DIAGNOSIS — D631 Anemia in chronic kidney disease: Secondary | ICD-10-CM | POA: Diagnosis present

## 2021-12-06 DIAGNOSIS — Z7982 Long term (current) use of aspirin: Secondary | ICD-10-CM

## 2021-12-06 DIAGNOSIS — J189 Pneumonia, unspecified organism: Secondary | ICD-10-CM | POA: Diagnosis present

## 2021-12-06 DIAGNOSIS — Z20822 Contact with and (suspected) exposure to covid-19: Secondary | ICD-10-CM | POA: Diagnosis present

## 2021-12-06 DIAGNOSIS — G4733 Obstructive sleep apnea (adult) (pediatric): Secondary | ICD-10-CM | POA: Diagnosis present

## 2021-12-06 DIAGNOSIS — J9621 Acute and chronic respiratory failure with hypoxia: Secondary | ICD-10-CM | POA: Diagnosis present

## 2021-12-06 DIAGNOSIS — J302 Other seasonal allergic rhinitis: Secondary | ICD-10-CM | POA: Diagnosis present

## 2021-12-06 DIAGNOSIS — N184 Chronic kidney disease, stage 4 (severe): Secondary | ICD-10-CM | POA: Diagnosis present

## 2021-12-06 DIAGNOSIS — E119 Type 2 diabetes mellitus without complications: Secondary | ICD-10-CM

## 2021-12-06 DIAGNOSIS — R042 Hemoptysis: Secondary | ICD-10-CM | POA: Diagnosis not present

## 2021-12-06 DIAGNOSIS — E11649 Type 2 diabetes mellitus with hypoglycemia without coma: Secondary | ICD-10-CM | POA: Diagnosis not present

## 2021-12-06 DIAGNOSIS — E1122 Type 2 diabetes mellitus with diabetic chronic kidney disease: Secondary | ICD-10-CM | POA: Diagnosis present

## 2021-12-06 DIAGNOSIS — I161 Hypertensive emergency: Secondary | ICD-10-CM | POA: Diagnosis present

## 2021-12-06 DIAGNOSIS — E669 Obesity, unspecified: Secondary | ICD-10-CM | POA: Diagnosis present

## 2021-12-06 DIAGNOSIS — I10 Essential (primary) hypertension: Secondary | ICD-10-CM | POA: Diagnosis present

## 2021-12-06 DIAGNOSIS — T17490A Other foreign object in trachea causing asphyxiation, initial encounter: Principal | ICD-10-CM | POA: Diagnosis present

## 2021-12-06 DIAGNOSIS — G4700 Insomnia, unspecified: Secondary | ICD-10-CM | POA: Diagnosis present

## 2021-12-06 DIAGNOSIS — Z87891 Personal history of nicotine dependence: Secondary | ICD-10-CM

## 2021-12-06 DIAGNOSIS — J9601 Acute respiratory failure with hypoxia: Secondary | ICD-10-CM | POA: Diagnosis present

## 2021-12-06 DIAGNOSIS — Z79899 Other long term (current) drug therapy: Secondary | ICD-10-CM

## 2021-12-06 DIAGNOSIS — E1169 Type 2 diabetes mellitus with other specified complication: Secondary | ICD-10-CM

## 2021-12-06 DIAGNOSIS — N185 Chronic kidney disease, stage 5: Secondary | ICD-10-CM | POA: Diagnosis present

## 2021-12-06 DIAGNOSIS — Z6837 Body mass index (BMI) 37.0-37.9, adult: Secondary | ICD-10-CM

## 2021-12-06 DIAGNOSIS — Z430 Encounter for attention to tracheostomy: Secondary | ICD-10-CM

## 2021-12-06 DIAGNOSIS — J690 Pneumonitis due to inhalation of food and vomit: Secondary | ICD-10-CM | POA: Diagnosis present

## 2021-12-06 DIAGNOSIS — Y838 Other surgical procedures as the cause of abnormal reaction of the patient, or of later complication, without mention of misadventure at the time of the procedure: Secondary | ICD-10-CM | POA: Diagnosis present

## 2021-12-06 DIAGNOSIS — J383 Other diseases of vocal cords: Secondary | ICD-10-CM | POA: Diagnosis present

## 2021-12-06 DIAGNOSIS — Z7985 Long-term (current) use of injectable non-insulin antidiabetic drugs: Secondary | ICD-10-CM

## 2021-12-06 DIAGNOSIS — J386 Stenosis of larynx: Secondary | ICD-10-CM | POA: Diagnosis present

## 2021-12-06 DIAGNOSIS — J4520 Mild intermittent asthma, uncomplicated: Secondary | ICD-10-CM | POA: Diagnosis present

## 2021-12-06 DIAGNOSIS — Z930 Tracheostomy status: Secondary | ICD-10-CM

## 2021-12-06 DIAGNOSIS — E1165 Type 2 diabetes mellitus with hyperglycemia: Secondary | ICD-10-CM | POA: Diagnosis present

## 2021-12-06 LAB — I-STAT BETA HCG BLOOD, ED (MC, WL, AP ONLY): I-stat hCG, quantitative: <5 m[IU]/mL (ref <5)

## 2021-12-06 LAB — CBC WITH DIFFERENTIAL/PLATELET
Abs Immature Granulocytes: 0.05 10*3/uL (ref 0.00–0.07)
Basophils Absolute: 0 10*3/uL (ref 0.0–0.1)
Basophils Relative: 0 %
Eosinophils Absolute: 0.1 10*3/uL (ref 0.0–0.5)
Eosinophils Relative: 1 %
HCT: 30.2 % — ABNORMAL LOW (ref 36.0–46.0)
Hemoglobin: 9.4 g/dL — ABNORMAL LOW (ref 12.0–15.0)
Immature Granulocytes: 0 %
Lymphocytes Relative: 9 %
Lymphs Abs: 1.3 10*3/uL (ref 0.7–4.0)
MCH: 29.7 pg (ref 26.0–34.0)
MCHC: 31.1 g/dL (ref 30.0–36.0)
MCV: 95.6 fL (ref 80.0–100.0)
Monocytes Absolute: 0.7 10*3/uL (ref 0.1–1.0)
Monocytes Relative: 5 %
Neutro Abs: 12.1 10*3/uL — ABNORMAL HIGH (ref 1.7–7.7)
Neutrophils Relative %: 85 %
Platelets: 205 10*3/uL (ref 150–400)
RBC: 3.16 MIL/uL — ABNORMAL LOW (ref 3.87–5.11)
RDW: 13.8 % (ref 11.5–15.5)
WBC: 14.2 10*3/uL — ABNORMAL HIGH (ref 4.0–10.5)
nRBC: 0 % (ref 0.0–0.2)

## 2021-12-06 LAB — COMPREHENSIVE METABOLIC PANEL
ALT: 14 U/L (ref 0–44)
AST: 14 U/L — ABNORMAL LOW (ref 15–41)
Albumin: 3.3 g/dL — ABNORMAL LOW (ref 3.5–5.0)
Alkaline Phosphatase: 111 U/L (ref 38–126)
Anion gap: 12 (ref 5–15)
BUN: 36 mg/dL — ABNORMAL HIGH (ref 6–20)
CO2: 19 mmol/L — ABNORMAL LOW (ref 22–32)
Calcium: 9.6 mg/dL (ref 8.9–10.3)
Chloride: 106 mmol/L (ref 98–111)
Creatinine, Ser: 2.35 mg/dL — ABNORMAL HIGH (ref 0.44–1.00)
GFR, Estimated: 24 mL/min — ABNORMAL LOW (ref >60)
Glucose, Bld: 397 mg/dL — ABNORMAL HIGH (ref 70–99)
Potassium: 5 mmol/L (ref 3.5–5.1)
Sodium: 137 mmol/L (ref 135–145)
Total Bilirubin: 0.6 mg/dL (ref 0.3–1.2)
Total Protein: 6.5 g/dL (ref 6.5–8.1)

## 2021-12-06 LAB — PROTIME-INR
INR: 1 (ref 0.8–1.2)
Prothrombin Time: 13.2 seconds (ref 11.4–15.2)

## 2021-12-06 LAB — RESP PANEL BY RT-PCR (FLU A&B, COVID) ARPGX2
Influenza A by PCR: NEGATIVE
Influenza B by PCR: NEGATIVE
SARS Coronavirus 2 by RT PCR: NEGATIVE

## 2021-12-06 LAB — APTT: aPTT: 27 seconds (ref 24–36)

## 2021-12-06 LAB — LACTIC ACID, PLASMA: Lactic Acid, Venous: 1.2 mmol/L (ref 0.5–1.9)

## 2021-12-06 MED ORDER — LACTATED RINGERS IV SOLN: INTRAVENOUS | Status: DC

## 2021-12-06 MED ORDER — SODIUM CHLORIDE 0.9 % IV SOLN
500.0000 mg | INTRAVENOUS | Status: DC
  Administered 2021-12-06 – 2021-12-07 (×2): 500 mg via INTRAVENOUS
  Filled 2021-12-06 (×2): qty 5

## 2021-12-06 MED ORDER — SODIUM CHLORIDE 0.9 % IV SOLN
2.0000 g | INTRAVENOUS | Status: AC
  Administered 2021-12-06 – 2021-12-10 (×5): 2 g via INTRAVENOUS
  Filled 2021-12-06 (×6): qty 20

## 2021-12-06 NOTE — ED Notes (Signed)
Sister/roommate [name] [phone] would like an update

## 2021-12-06 NOTE — ED Triage Notes (Signed)
Pt here from home via [facility] for shob and nausea that started today Pt had trach placed recently, is on 4L O2 at baseline. When fire dept got there pt's SpO2 was 63%. EMS gave 10mg  albuterol and 0.5mg  atrovent nebulizer, highest SpO2 89%. 146/80, 86HR, 22RR, 20g RAC

## 2021-12-06 NOTE — ED Provider Notes (Signed)
[facility] EMERGENCY DEPARTMENT Provider Note   CSN: [id number] Arrival date & time: 12/06/21  2109     History Chief Complaint  Patient presents with   Shortness of Breath   hypoxia    Maureen Jones is a 55 y.o. female.   Shortness of Breath  This patient is a 55 year old female, she has a history of asthma, diabetes, hypertension and has been diagnosed with glottic and subglottic edema, she has vocal cord dysfunction and had a tracheostomy tube that was placed.  According to the medical record the patient had presented to the hospital on October 10, she was altered mental status and had severe hypertensive emergency with hypoxic respiratory failure, she had postintubation subglottic and glottic edema with hoarseness and required trach placement.  She had a short PEA arrest with 10 minutes of CPR but had return of spontaneous circulation while she was on the floor and had to be transferred back to the ICU.  Ultimately she was discharged on December 5.  For the last 3-1/2 weeks she has been at home and overall doing okay, she presents to the hospital today feeling like she has been nauseated and short of breath all day.  She has not been able to cough very much up but has had some bloody sputum coming up.  No fevers or chills that she is aware of, no diarrhea, she does have some chest pain with this as well.  The patient is having a significant difficulty talking she is in severe distress and was found to have oxygen of 60% by paramedics who gave her higher flow oxygen, they tried to suction but were not able to get anything back.  Past Medical History:  Diagnosis Date   Asthma    Diabetes ([place])    Glottic and subglottic edema 2022   Hypertension     Patient Active Problem List   Diagnosis Date Noted   Pneumonia 12/06/2021   CKD (chronic kidney disease) stage 4, GFR 15-29 ml/min (HCC) 11/22/2021   Mood disorder ([place]) 11/22/2021   Tracheostomy tube  present ([place])    Subglottic edema    Vocal cord dysfunction    Acute respiratory failure ([place])    Endotracheal tube present    Encephalopathy acute 09/20/2021   Bilateral carpal tunnel syndrome 11/15/2019   Essential hypertension 11/15/2019   Insomnia 11/15/2019   Mild intermittent asthma without complication 50/93/2671   OSA (obstructive sleep apnea) 11/15/2019   Seasonal allergic rhinitis 11/15/2019   Tobacco abuse 11/15/2019   DM (diabetes mellitus) ([place]) 11/15/2019    No past surgical history on file.   OB History   No obstetric history on file.     No family history on file.  Social History   Tobacco Use   Smoking status: Former    Types: Cigarettes   Smokeless tobacco: Never  Vaping Use   Vaping Use: Never used  Substance Use Topics   Alcohol use: Never   Drug use: Never    Home Medications Prior to Admission medications   Medication Sig Start Date End Date Taking? Authorizing Provider  albuterol (PROVENTIL) (2.5 MG/3ML) 0.083% nebulizer solution Use 1 vial (2.5 mg total) by nebulization every 4 (four) hours as needed for wheezing or shortness of breath. 11/15/21  Yes Elgergawy, Silver Huguenin, MD  amLODipine (NORVASC) 10 MG tablet Take 1 tablet (10 mg total) by mouth daily. 11/16/21  Yes Elgergawy, Silver Huguenin, MD  aspirin EC 81 MG tablet Take 1 tablet (81 mg  total) by mouth daily. Swallow whole. 11/15/21 12/15/21 Yes Elgergawy, Silver Huguenin, MD  atorvastatin (LIPITOR) 20 MG tablet Take 1 tablet (20 mg total) by mouth daily. 11/16/21  Yes Elgergawy, Silver Huguenin, MD  budesonide (PULMICORT) 0.25 MG/2ML nebulizer solution Use 1 vial (0.25 mg total) by nebulization 2 (two) times daily. 11/15/21  Yes Elgergawy, Silver Huguenin, MD  carvedilol (COREG) 25 MG tablet Take 1 tablet (25 mg total) by mouth 2 (two) times daily. 11/15/21  Yes Elgergawy, Silver Huguenin, MD  clonazePAM (KLONOPIN) 0.5 MG tablet Take 1/2 tablet (0.25 mg total) by mouth daily at bedtime. 11/15/21  Yes Elgergawy, Silver Huguenin, MD   DULoxetine (CYMBALTA) 60 MG capsule Take 1 capsule (60 mg total) by mouth daily. 11/22/21  Yes Nche, Charlene Brooke, NP  hydrALAZINE (APRESOLINE) 100 MG tablet Take 1 tablet (100 mg total) by mouth 3 (three) times daily. 11/15/21  Yes Elgergawy, Silver Huguenin, MD  insulin aspart (NOVOLOG) 100 UNIT/ML injection Insulin (Novolog) sliding scale for additional Novolog: administer 42minutes before meals. Sugar <150 - no units Sugar >151-200 - 4 units Sugar >200-250 - 6 units Sugar >251-300 - 10 units and call office. Patient taking differently: Inject 4-10 Units into the skin See admin instructions. Insulin (Novolog) sliding scale for additional Novolog: administer 2minutes before meals. Sugar <150 - no units Sugar >151-200 - 4 units Sugar >200-250 - 6 units Sugar >251-300 - 10 units and call office. 11/22/21  Yes Nche, Charlene Brooke, NP  losartan (COZAAR) 50 MG tablet Take 1 tablet (50 mg total) by mouth daily. 11/15/21 12/15/21 Yes Elgergawy, Silver Huguenin, MD  pantoprazole (PROTONIX) 40 MG tablet Take 1 tablet (40 mg total) by mouth 2 (two) times daily. 11/15/21  Yes Elgergawy, Silver Huguenin, MD  TRESIBA FLEXTOUCH 200 UNIT/ML FlexTouch Pen Inject 32 Units into the skin 2 (two) times daily. 11/22/21  Yes Nche, Charlene Brooke, NP    Allergies    Patient has no known allergies.  Review of Systems   Review of Systems  Unable to perform ROS: Acuity of condition  Respiratory:  Positive for shortness of breath.    Physical Exam Updated Vital Signs BP (!) 161/74    Pulse 92    Temp 97.8 F (36.6 C) (Oral)    Resp 13    SpO2 98%   Physical Exam Vitals and nursing note reviewed.  Constitutional:      General: She is in acute distress.     Appearance: She is well-developed. She is ill-appearing and diaphoretic.  HENT:     Head: Normocephalic and atraumatic.     Mouth/Throat:     Pharynx: No oropharyngeal exudate.  Eyes:     General: No scleral icterus.       Right eye: No discharge.        Left eye: No  discharge.     Conjunctiva/sclera: Conjunctivae normal.     Pupils: Pupils are equal, round, and reactive to light.  Neck:     Thyroid: No thyromegaly.     Vascular: No JVD.  Cardiovascular:     Rate and Rhythm: Regular rhythm. Tachycardia present.     Heart sounds: Normal heart sounds. No murmur heard.   No friction rub. No gallop.  Pulmonary:     Effort: Tachypnea, accessory muscle usage and respiratory distress present.     Breath sounds: Wheezing, rhonchi and rales present.     Comments: Present, rhonchi and rales present Abdominal:     General: Bowel sounds are normal. There  is no distension.     Palpations: Abdomen is soft. There is no mass.     Tenderness: There is no abdominal tenderness.  Musculoskeletal:        General: No tenderness. Normal range of motion.     Cervical back: Normal range of motion and neck supple.  Lymphadenopathy:     Cervical: No cervical adenopathy.  Skin:    General: Skin is warm.     Findings: No erythema or rash.  Neurological:     Mental Status: She is alert.     Coordination: Coordination normal.  Psychiatric:        Behavior: Behavior normal.    ED Results / Procedures / Treatments   Labs (all labs ordered are listed, but only abnormal results are displayed) Labs Reviewed  COMPREHENSIVE METABOLIC PANEL - Abnormal; Notable for the following components:      Result Value   CO2 19 (*)    Glucose, Bld 397 (*)    BUN 36 (*)    Creatinine, Ser 2.35 (*)    Albumin 3.3 (*)    AST 14 (*)    GFR, Estimated 24 (*)    All other components within normal limits  CBC WITH DIFFERENTIAL/PLATELET - Abnormal; Notable for the following components:   WBC 14.2 (*)    RBC 3.16 (*)    Hemoglobin 9.4 (*)    HCT 30.2 (*)    Neutro Abs 12.1 (*)    All other components within normal limits  RESP PANEL BY RT-PCR (FLU A&B, COVID) ARPGX2  CULTURE, BLOOD (ROUTINE X 2)  CULTURE, BLOOD (ROUTINE X 2)  URINE CULTURE  CULTURE, RESPIRATORY W GRAM STAIN   EXPECTORATED SPUTUM ASSESSMENT W GRAM STAIN, RFLX TO RESP C  LACTIC ACID, PLASMA  PROTIME-INR  APTT  LACTIC ACID, PLASMA  URINALYSIS, ROUTINE W REFLEX MICROSCOPIC  I-STAT BETA HCG BLOOD, ED ([facility], [facility], [facility] ONLY)    EKG EKG Interpretation  Date/Time:  Monday December 06 2021 21:21:44 EST Ventricular Rate:  88 PR Interval:  148 QRS Duration: 150 QT Interval:  379 QTC Calculation: 459 R Axis:   31 Text Interpretation: Sinus rhythm Left atrial enlargement Right bundle branch block Baseline wander in lead(s) V1 Since last tracing rate faster Confirmed by Noemi Chapel [phone]) on 12/06/2021 9:36:04 PM  Radiology DG Chest Port 1 View  Result Date: 12/06/2021 CLINICAL DATA:  Sepsis workup. EXAM: PORTABLE CHEST 1 VIEW COMPARISON:  Portable chest 10/16/2021. FINDINGS: Interval removal of prior feeding tube and right PICC. Tracheostomy cannula remains in place common not significantly changed. There are multiple overlying monitor wires, more so on the right. There is mild-to-moderate cardiomegaly with normal caliber central vasculature. There is a low inspiratory effort , with asymmetric increased opacity in the left base which could be asymmetric atelectasis, pneumonia or aspiration. Remaining lungs are clear. No pleural effusion is seen. There are calcifications in the aortic arch. Osteopenia and thoracic spondylosis. IMPRESSION: Low-inspiration study with asymmetric opacity left base, which could be asymmetric atelectasis, pneumonia, or aspiration. Follow-up study recommended in full inspiration. Electronically Signed   By: Telford Nab M.D.   On: 12/06/2021 22:12    Procedures .Critical Care Performed by: Noemi Chapel, MD Authorized by: Noemi Chapel, MD   Critical care provider statement:    Critical care time (minutes):  30   Critical care was necessary to treat or prevent imminent or life-threatening deterioration of the following conditions:  Respiratory failure   Critical care was  time spent  personally by me on the following activities:  Development of treatment plan with patient or surrogate, discussions with consultants, evaluation of patient's response to treatment, examination of patient, ordering and review of laboratory studies, ordering and review of radiographic studies, ordering and performing treatments and interventions, pulse oximetry, re-evaluation of patient's condition and review of old charts Comments:         Medications Ordered in ED Medications  lactated ringers infusion (has no administration in time range)  cefTRIAXone (ROCEPHIN) 2 g in sodium chloride 0.9 % 100 mL IVPB (2 g Intravenous New Bag/Given 12/06/21 2201)  azithromycin (ZITHROMAX) 500 mg in sodium chloride 0.9 % 250 mL IVPB (has no administration in time range)    ED Course  I have reviewed the triage vital signs and the nursing notes.  Pertinent labs & imaging results that were available during my care of the patient were reviewed by me and considered in my medical decision making (see chart for details).    MDM Rules/Calculators/A&P                          This patient presents to the ED for concern of hypoxia, this involves an extensive number of treatment options, and is a complaint that carries with it a high risk of complications and morbidity.  The differential diagnosis includes bleeding, infection, pneumonia, collapse, bronchiectasis, pulmonary embolism   Additional history obtained:  Additional history obtained from paramedic External records from outside source obtained and reviewed including prior notes including external records such as the patient's prior discharge summary and inpatient notes including tracheostomy notes pulmonary notes hospitalist notes and discharge summary   Lab Tests:  I Ordered, reviewed, and interpreted labs.  The pertinent results include: Elevated white blood cell count   Imaging Studies ordered:  I ordered imaging studies including  portable chest x-ray I independently visualized and interpreted imaging which showed left lower lobe infiltrate I agree with the radiologist interpretation   Cardiac Monitoring:  The patient was maintained on a cardiac monitor.  I personally viewed and interpreted the cardiac monitored which showed an underlying rhythm of: Sinus tachycardia which improved to a normal sinus rhythm   Medicines ordered and prescription drug management:  I ordered medication including antibiotics, aggressive suctioning for pneumonia and tracheostomy occlusion Reevaluation of the patient after these medicines showed that the patient improved I have reviewed the patients home medicines and have made adjustments as needed   Critical Interventions:  Aggressive tracheostomy suctioning Antibiotic Critical care consultation   ED Course:  Consulted with critical care at 9:20 PM asking them to see the patient.  Continuing to suction   Consultations Obtained:  I requested consultation with the intensive care unit physician,  and discussed lab and imaging findings as well as pertinent plan - they recommend: The patient to be admitted to the hospital, pulmonary toilet, can go to hospitalist service   Reevaluation:  After the interventions noted above, I reevaluated the patient and found that they have :improved, oxygen had come up to 95% on 4 L whereas initially she was 60%   Dispostion:  After consideration of the diagnostic results and the patients response to treatment feel that the patent would benefit from admission to the hospital.       Final Clinical Impression(s) / ED Diagnoses Final diagnoses:  Acute respiratory failure with hypoxia ([place])     Noemi Chapel, MD 12/06/21 2258

## 2021-12-06 NOTE — Sepsis Progress Note (Addendum)
Following per sepsis protocol   

## 2021-12-07 ENCOUNTER — Encounter (HOSPITAL_COMMUNITY): Payer: Self-pay | Admitting: Internal Medicine

## 2021-12-07 DIAGNOSIS — D631 Anemia in chronic kidney disease: Secondary | ICD-10-CM | POA: Diagnosis present

## 2021-12-07 DIAGNOSIS — G4700 Insomnia, unspecified: Secondary | ICD-10-CM | POA: Diagnosis present

## 2021-12-07 DIAGNOSIS — Z7982 Long term (current) use of aspirin: Secondary | ICD-10-CM | POA: Diagnosis not present

## 2021-12-07 DIAGNOSIS — Z6837 Body mass index (BMI) 37.0-37.9, adult: Secondary | ICD-10-CM | POA: Diagnosis not present

## 2021-12-07 DIAGNOSIS — J383 Other diseases of vocal cords: Secondary | ICD-10-CM | POA: Diagnosis present

## 2021-12-07 DIAGNOSIS — J690 Pneumonitis due to inhalation of food and vomit: Secondary | ICD-10-CM | POA: Diagnosis present

## 2021-12-07 DIAGNOSIS — E1169 Type 2 diabetes mellitus with other specified complication: Secondary | ICD-10-CM | POA: Diagnosis not present

## 2021-12-07 DIAGNOSIS — J9621 Acute and chronic respiratory failure with hypoxia: Secondary | ICD-10-CM | POA: Diagnosis present

## 2021-12-07 DIAGNOSIS — E11649 Type 2 diabetes mellitus with hypoglycemia without coma: Secondary | ICD-10-CM | POA: Diagnosis not present

## 2021-12-07 DIAGNOSIS — J302 Other seasonal allergic rhinitis: Secondary | ICD-10-CM | POA: Diagnosis present

## 2021-12-07 DIAGNOSIS — J9601 Acute respiratory failure with hypoxia: Secondary | ICD-10-CM | POA: Diagnosis present

## 2021-12-07 DIAGNOSIS — E1165 Type 2 diabetes mellitus with hyperglycemia: Secondary | ICD-10-CM | POA: Diagnosis present

## 2021-12-07 DIAGNOSIS — G4733 Obstructive sleep apnea (adult) (pediatric): Secondary | ICD-10-CM | POA: Diagnosis present

## 2021-12-07 DIAGNOSIS — J386 Stenosis of larynx: Secondary | ICD-10-CM | POA: Diagnosis present

## 2021-12-07 DIAGNOSIS — Y838 Other surgical procedures as the cause of abnormal reaction of the patient, or of later complication, without mention of misadventure at the time of the procedure: Secondary | ICD-10-CM | POA: Diagnosis present

## 2021-12-07 DIAGNOSIS — J9811 Atelectasis: Secondary | ICD-10-CM | POA: Diagnosis not present

## 2021-12-07 DIAGNOSIS — Z20822 Contact with and (suspected) exposure to covid-19: Secondary | ICD-10-CM | POA: Diagnosis present

## 2021-12-07 DIAGNOSIS — T17490A Other foreign object in trachea causing asphyxiation, initial encounter: Secondary | ICD-10-CM | POA: Diagnosis present

## 2021-12-07 DIAGNOSIS — J189 Pneumonia, unspecified organism: Secondary | ICD-10-CM | POA: Diagnosis present

## 2021-12-07 DIAGNOSIS — E1122 Type 2 diabetes mellitus with diabetic chronic kidney disease: Secondary | ICD-10-CM | POA: Diagnosis present

## 2021-12-07 DIAGNOSIS — J4520 Mild intermittent asthma, uncomplicated: Secondary | ICD-10-CM | POA: Diagnosis present

## 2021-12-07 DIAGNOSIS — I129 Hypertensive chronic kidney disease with stage 1 through stage 4 chronic kidney disease, or unspecified chronic kidney disease: Secondary | ICD-10-CM | POA: Diagnosis present

## 2021-12-07 DIAGNOSIS — I161 Hypertensive emergency: Secondary | ICD-10-CM | POA: Diagnosis present

## 2021-12-07 DIAGNOSIS — N184 Chronic kidney disease, stage 4 (severe): Secondary | ICD-10-CM | POA: Diagnosis present

## 2021-12-07 DIAGNOSIS — R042 Hemoptysis: Secondary | ICD-10-CM | POA: Diagnosis not present

## 2021-12-07 DIAGNOSIS — Z87891 Personal history of nicotine dependence: Secondary | ICD-10-CM | POA: Diagnosis not present

## 2021-12-07 DIAGNOSIS — E669 Obesity, unspecified: Secondary | ICD-10-CM | POA: Diagnosis present

## 2021-12-07 DIAGNOSIS — I10 Essential (primary) hypertension: Secondary | ICD-10-CM | POA: Diagnosis not present

## 2021-12-07 HISTORY — DX: Acute respiratory failure with hypoxia: J96.01

## 2021-12-07 LAB — URINALYSIS, ROUTINE W REFLEX MICROSCOPIC
Bilirubin Urine: NEGATIVE
Glucose, UA: >=500 mg/dL — AB
Hgb urine dipstick: NEGATIVE
Ketones, ur: NEGATIVE mg/dL
Leukocytes,Ua: NEGATIVE
Nitrite: NEGATIVE
Protein, ur: >300 mg/dL — AB
Specific Gravity, Urine: 1.02 (ref 1.005–1.030)
pH: 6 (ref 5.0–8.0)

## 2021-12-07 LAB — CBC
HCT: 29.3 % — ABNORMAL LOW (ref 36.0–46.0)
Hemoglobin: 8.9 g/dL — ABNORMAL LOW (ref 12.0–15.0)
MCH: 28.9 pg (ref 26.0–34.0)
MCHC: 30.4 g/dL (ref 30.0–36.0)
MCV: 95.1 fL (ref 80.0–100.0)
Platelets: 213 10*3/uL (ref 150–400)
RBC: 3.08 MIL/uL — ABNORMAL LOW (ref 3.87–5.11)
RDW: 14.1 % (ref 11.5–15.5)
WBC: 13.4 10*3/uL — ABNORMAL HIGH (ref 4.0–10.5)
nRBC: 0 % (ref 0.0–0.2)

## 2021-12-07 LAB — MAGNESIUM: Magnesium: 1.7 mg/dL (ref 1.7–2.4)

## 2021-12-07 LAB — URINALYSIS, MICROSCOPIC (REFLEX)

## 2021-12-07 LAB — BASIC METABOLIC PANEL
Anion gap: 7 (ref 5–15)
BUN: 34 mg/dL — ABNORMAL HIGH (ref 6–20)
CO2: 25 mmol/L (ref 22–32)
Calcium: 9.4 mg/dL (ref 8.9–10.3)
Chloride: 106 mmol/L (ref 98–111)
Creatinine, Ser: 2.4 mg/dL — ABNORMAL HIGH (ref 0.44–1.00)
GFR, Estimated: 23 mL/min — ABNORMAL LOW (ref >60)
Glucose, Bld: 337 mg/dL — ABNORMAL HIGH (ref 70–99)
Potassium: 4.9 mmol/L (ref 3.5–5.1)
Sodium: 138 mmol/L (ref 135–145)

## 2021-12-07 LAB — GLUCOSE, CAPILLARY
Glucose-Capillary: 191 mg/dL — ABNORMAL HIGH (ref 70–99)
Glucose-Capillary: 196 mg/dL — ABNORMAL HIGH (ref 70–99)

## 2021-12-07 LAB — CBG MONITORING, ED
Glucose-Capillary: 285 mg/dL — ABNORMAL HIGH (ref 70–99)
Glucose-Capillary: 223 mg/dL — ABNORMAL HIGH (ref 70–99)

## 2021-12-07 MED ORDER — LOSARTAN POTASSIUM 50 MG PO TABS
50.0000 mg | ORAL_TABLET | Freq: Every day | ORAL | Status: DC
  Administered 2021-12-07 – 2021-12-17 (×11): 50 mg via ORAL
  Filled 2021-12-07 (×11): qty 1

## 2021-12-07 MED ORDER — CLONAZEPAM 0.5 MG PO TABS: 0.2500 mg | ORAL_TABLET | Freq: Every day | ORAL | Status: DC

## 2021-12-07 MED ORDER — FLUTICASONE PROPIONATE 50 MCG/ACT NA SUSP
2.0000 | Freq: Every day | NASAL | Status: DC
  Filled 2021-12-07: qty 16

## 2021-12-07 MED ORDER — HYDRALAZINE HCL 50 MG PO TABS
100.0000 mg | ORAL_TABLET | Freq: Three times a day (TID) | ORAL | Status: DC
  Administered 2021-12-07 – 2021-12-17 (×29): 100 mg via ORAL
  Filled 2021-12-07 (×28): qty 2

## 2021-12-07 MED ORDER — ALBUTEROL SULFATE (2.5 MG/3ML) 0.083% IN NEBU
2.5000 mg | INHALATION_SOLUTION | RESPIRATORY_TRACT | Status: DC
  Administered 2021-12-07 (×2): 2.5 mg via RESPIRATORY_TRACT
  Filled 2021-12-07 (×2): qty 3

## 2021-12-07 MED ORDER — IPRATROPIUM BROMIDE 0.02 % IN SOLN
0.5000 mg | RESPIRATORY_TRACT | Status: DC
  Administered 2021-12-07 (×2): 0.5 mg via RESPIRATORY_TRACT
  Filled 2021-12-07 (×2): qty 2.5

## 2021-12-07 MED ORDER — CARVEDILOL 25 MG PO TABS
25.0000 mg | ORAL_TABLET | Freq: Two times a day (BID) | ORAL | Status: DC
  Administered 2021-12-07 – 2021-12-13 (×13): 25 mg via ORAL
  Filled 2021-12-07 (×12): qty 1
  Filled 2021-12-07: qty 2

## 2021-12-07 MED ORDER — ALBUTEROL SULFATE (2.5 MG/3ML) 0.083% IN NEBU
2.5000 mg | INHALATION_SOLUTION | RESPIRATORY_TRACT | Status: DC | PRN
  Administered 2021-12-07 – 2021-12-08 (×2): 2.5 mg via RESPIRATORY_TRACT
  Filled 2021-12-07 (×2): qty 3

## 2021-12-07 MED ORDER — LACTATED RINGERS IV SOLN: INTRAVENOUS | Status: DC

## 2021-12-07 MED ORDER — IPRATROPIUM-ALBUTEROL 0.5-2.5 (3) MG/3ML IN SOLN
3.0000 mL | RESPIRATORY_TRACT | Status: DC
  Administered 2021-12-07 – 2021-12-08 (×5): 3 mL via RESPIRATORY_TRACT
  Filled 2021-12-07 (×5): qty 3

## 2021-12-07 MED ORDER — INSULIN GLARGINE-YFGN 100 UNIT/ML ~~LOC~~ SOLN
32.0000 [IU] | Freq: Two times a day (BID) | SUBCUTANEOUS | Status: DC
  Administered 2021-12-07 – 2021-12-09 (×7): 32 [IU] via SUBCUTANEOUS
  Filled 2021-12-07 (×10): qty 0.32

## 2021-12-07 MED ORDER — TRAZODONE HCL 50 MG PO TABS
50.0000 mg | ORAL_TABLET | Freq: Every day | ORAL | Status: DC
  Administered 2021-12-07 – 2021-12-16 (×10): 50 mg via ORAL
  Filled 2021-12-07 (×10): qty 1

## 2021-12-07 MED ORDER — CLONAZEPAM 0.25 MG PO TBDP
0.2500 mg | ORAL_TABLET | Freq: Every day | ORAL | Status: DC
  Administered 2021-12-07 – 2021-12-16 (×10): 0.25 mg via ORAL
  Filled 2021-12-07 (×10): qty 1

## 2021-12-07 MED ORDER — INSULIN ASPART 100 UNIT/ML IJ SOLN
0.0000 [IU] | Freq: Three times a day (TID) | INTRAMUSCULAR | Status: DC
  Administered 2021-12-07: 10:00:00 8 [IU] via SUBCUTANEOUS
  Administered 2021-12-07: 12:00:00 5 [IU] via SUBCUTANEOUS
  Administered 2021-12-07: 18:00:00 3 [IU] via SUBCUTANEOUS
  Administered 2021-12-08 (×2): 2 [IU] via SUBCUTANEOUS
  Administered 2021-12-09: 12:00:00 3 [IU] via SUBCUTANEOUS
  Administered 2021-12-09: 18:00:00 2 [IU] via SUBCUTANEOUS

## 2021-12-07 MED ORDER — AMLODIPINE BESYLATE 10 MG PO TABS
10.0000 mg | ORAL_TABLET | Freq: Every day | ORAL | Status: DC
  Administered 2021-12-08 – 2021-12-17 (×10): 10 mg via ORAL
  Filled 2021-12-07 (×10): qty 1

## 2021-12-07 MED ORDER — ATORVASTATIN CALCIUM 10 MG PO TABS
20.0000 mg | ORAL_TABLET | Freq: Every day | ORAL | Status: DC
  Administered 2021-12-08 – 2021-12-17 (×10): 20 mg via ORAL
  Filled 2021-12-07 (×10): qty 2

## 2021-12-07 MED ORDER — ACETAMINOPHEN 325 MG PO TABS
650.0000 mg | ORAL_TABLET | Freq: Four times a day (QID) | ORAL | Status: DC | PRN
  Administered 2021-12-08: 16:00:00 650 mg via ORAL
  Filled 2021-12-07: qty 2

## 2021-12-07 MED ORDER — MAGNESIUM SULFATE 2 GM/50ML IV SOLN
2.0000 g | Freq: Once | INTRAVENOUS | Status: AC
Start: 2021-12-07 — End: 2021-12-07
  Administered 2021-12-07: 14:00:00 2 g via INTRAVENOUS
  Filled 2021-12-07: qty 50

## 2021-12-07 MED ORDER — BENZONATATE 100 MG PO CAPS: 200.0000 mg | ORAL_CAPSULE | Freq: Three times a day (TID) | ORAL | Status: DC | PRN

## 2021-12-07 MED ORDER — HYDRALAZINE HCL 20 MG/ML IJ SOLN
10.0000 mg | INTRAMUSCULAR | Status: DC | PRN
  Administered 2021-12-10 – 2021-12-14 (×4): 10 mg via INTRAVENOUS
  Filled 2021-12-07 (×4): qty 1

## 2021-12-07 MED ORDER — FLUTICASONE PROPIONATE 50 MCG/ACT NA SUSP
2.0000 | Freq: Every day | NASAL | Status: DC
Start: 2021-12-07 — End: 2021-12-17
  Administered 2021-12-07 – 2021-12-17 (×11): 2 via NASAL
  Filled 2021-12-07: qty 16

## 2021-12-07 MED ORDER — PANTOPRAZOLE SODIUM 40 MG PO TBEC
40.0000 mg | DELAYED_RELEASE_TABLET | Freq: Two times a day (BID) | ORAL | Status: DC
  Administered 2021-12-07 – 2021-12-17 (×20): 40 mg via ORAL
  Filled 2021-12-07 (×21): qty 1

## 2021-12-07 MED ORDER — GUAIFENESIN ER 600 MG PO TB12
600.0000 mg | ORAL_TABLET | Freq: Two times a day (BID) | ORAL | Status: DC
  Administered 2021-12-07 – 2021-12-10 (×6): 600 mg via ORAL
  Filled 2021-12-07 (×6): qty 1

## 2021-12-07 MED ORDER — ENOXAPARIN SODIUM 40 MG/0.4ML IJ SOSY
40.0000 mg | PREFILLED_SYRINGE | INTRAMUSCULAR | Status: DC
  Administered 2021-12-07 – 2021-12-17 (×10): 40 mg via SUBCUTANEOUS
  Filled 2021-12-07 (×11): qty 0.4

## 2021-12-07 MED ORDER — ACETAMINOPHEN 650 MG RE SUPP: 650.0000 mg | Freq: Four times a day (QID) | RECTAL | Status: DC | PRN

## 2021-12-07 MED ORDER — ASPIRIN EC 81 MG PO TBEC
81.0000 mg | DELAYED_RELEASE_TABLET | Freq: Every day | ORAL | Status: DC
  Administered 2021-12-09 – 2021-12-17 (×9): 81 mg via ORAL
  Filled 2021-12-07 (×10): qty 1

## 2021-12-07 MED ORDER — DULOXETINE HCL 60 MG PO CPEP
60.0000 mg | ORAL_CAPSULE | Freq: Every day | ORAL | Status: DC
  Administered 2021-12-08 – 2021-12-17 (×10): 60 mg via ORAL
  Filled 2021-12-07 (×10): qty 1

## 2021-12-07 MED ORDER — BUDESONIDE 0.25 MG/2ML IN SUSP
0.2500 mg | Freq: Two times a day (BID) | RESPIRATORY_TRACT | Status: DC
Start: 2021-12-07 — End: 2021-12-17
  Administered 2021-12-07 – 2021-12-17 (×21): 0.25 mg via RESPIRATORY_TRACT
  Filled 2021-12-07 (×21): qty 2

## 2021-12-07 NOTE — ED Notes (Signed)
Suctioned pt

## 2021-12-07 NOTE — Progress Notes (Signed)
Inpatient Diabetes Program Recommendations  AACE/ADA: New Consensus Statement on Inpatient Glycemic Control (2015)  Target Ranges:  Prepandial:   less than 140 mg/dL      Peak postprandial:   less than 180 mg/dL (1-2 hours)      Critically ill patients:  140 - 180 mg/dL   Lab Results  Component Value Date   GLUCAP 285 (H) 12/07/2021   HGBA1C 12.7 (H) 09/20/2021    Review of Glycemic Control  Latest Reference Range & Units 12/07/21 08:32  Glucose-Capillary 70 - 99 mg/dL 285 (H)   Diabetes history: DM 2 Outpatient Diabetes medications: Tresiba 32 units bid, Novolog 4-10 units tid with meals Current orders for Inpatient glycemic control:  Novolog moderate tid with meals Semglee 32 units bid  Inpatient Diabetes Program Recommendations:   Consider adding A1C to labs for recheck.   Agree with orders.  I spoke with patient back in October of 2022.    Thanks,  Adah Perl, RN, BC-ADM Inpatient Diabetes Coordinator Pager [phone]  (8a-5p)

## 2021-12-07 NOTE — ED Notes (Signed)
Lunch ordered 

## 2021-12-07 NOTE — ED Notes (Addendum)
error 

## 2021-12-07 NOTE — ED Notes (Signed)
Pt changed into new brief and clean linen. [place] applied.

## 2021-12-07 NOTE — ED Notes (Signed)
Admitting paged to RN per her request 

## 2021-12-07 NOTE — ED Notes (Signed)
Maureen Jones called and updated on status

## 2021-12-07 NOTE — Plan of Care (Signed)

## 2021-12-07 NOTE — Progress Notes (Signed)
Patient placed in observation after midnight, please see H&P.  Here with:   Acute respiratory failure with hypoxia presently on 40% trach collar likely secondary to aspiration and clogged tracheostomy tube which has been suction and aspiration has improved.  Chest x-ray does show infiltrates concerning for pneumonia for which patient is on antibiotics.  We will keep patient on nebulizer and frequent suctioning.    Hypertension controlled on amlodipine Coreg and hydralazine and as needed IV hydralazine follow blood pressure trends.  Diabetes mellitus type 2 with hyperglycemia  -on Tresiba 32 units twice daily.  Last hemoglobin A1c was 12.72 months ago.  Chronic kidney disease stage IV creatinine appears to be at baseline.  Anemia appears to be chronic  -hemoglobin is at baseline.  History of OSA presently on trach collar.   Upon speaking with patient, she has been out of her respiratory supplies for about 1 week leading to her hospitalization. TOC consult.  Eulogio Bear DO

## 2021-12-07 NOTE — H&P (Signed)
History and Physical    Cortlyn Cannell NTI:[id number] DOB: 1966-01-11 DOA: 12/06/2021  PCP: Flossie Buffy, NP  Patient coming from: Home.  Chief Complaint: Shortness of breath.  HPI: Maureen Jones is a 55 y.o. female with history of recent admission for respiratory failure in the setting of hypertensive emergency and hypoxia due to vocal cord dysfunction and supraglottic edema and asthma exacerbation was initially intubated and was on ventilator and subsequently had to have a tracheostomy was discharged on November 15, 2021 about 3 weeks ago was brought into the ER the patient started getting acutely short of breath last evening.  Patient states she was not able to cough well and had some bloody sputum.  No fever chills chest pain nausea vomiting or diarrhea.  ED Course: In the ER patient was hypoxic and it was noted that patient's tracheostomy cannula was clogged and had to be suction and also had lots of secretions from the trachea which were suctioned following which patient's respiration improved.  Chest x-ray does show infiltrates concerning for aspiration was placed on empiric antibiotics.  Initially critical care was consulted but they had evaluated the patient and requested hospitalist admission.  Labs are largely at baseline.  COVID test negative.  Review of Systems: As per HPI, rest all negative.   Past Medical History:  Diagnosis Date   Asthma    Diabetes ([place])    Glottic and subglottic edema 2022   Hypertension     History reviewed. No pertinent surgical history.   reports that she has quit smoking. Her smoking use included cigarettes. She has never used smokeless tobacco. She reports that she does not drink alcohol and does not use drugs.  No Known Allergies  Family History  Family history unknown: Yes    Prior to Admission medications   Medication Sig Start Date End Date Taking? Authorizing Provider  albuterol (PROVENTIL) (2.5 MG/3ML) 0.083% nebulizer  solution Use 1 vial (2.5 mg total) by nebulization every 4 (four) hours as needed for wheezing or shortness of breath. 11/15/21  Yes Elgergawy, Silver Huguenin, MD  amLODipine (NORVASC) 10 MG tablet Take 1 tablet (10 mg total) by mouth daily. 11/16/21  Yes Elgergawy, Silver Huguenin, MD  aspirin EC 81 MG tablet Take 1 tablet (81 mg total) by mouth daily. Swallow whole. 11/15/21 12/15/21 Yes Elgergawy, Silver Huguenin, MD  atorvastatin (LIPITOR) 20 MG tablet Take 1 tablet (20 mg total) by mouth daily. 11/16/21  Yes Elgergawy, Silver Huguenin, MD  budesonide (PULMICORT) 0.25 MG/2ML nebulizer solution Use 1 vial (0.25 mg total) by nebulization 2 (two) times daily. 11/15/21  Yes Elgergawy, Silver Huguenin, MD  carvedilol (COREG) 25 MG tablet Take 1 tablet (25 mg total) by mouth 2 (two) times daily. 11/15/21  Yes Elgergawy, Silver Huguenin, MD  clonazePAM (KLONOPIN) 0.5 MG tablet Take 1/2 tablet (0.25 mg total) by mouth daily at bedtime. 11/15/21  Yes Elgergawy, Silver Huguenin, MD  DULoxetine (CYMBALTA) 60 MG capsule Take 1 capsule (60 mg total) by mouth daily. 11/22/21  Yes Nche, Charlene Brooke, NP  hydrALAZINE (APRESOLINE) 100 MG tablet Take 1 tablet (100 mg total) by mouth 3 (three) times daily. 11/15/21  Yes Elgergawy, Silver Huguenin, MD  insulin aspart (NOVOLOG) 100 UNIT/ML injection Insulin (Novolog) sliding scale for additional Novolog: administer 38minutes before meals. Sugar <150 - no units Sugar >151-200 - 4 units Sugar >200-250 - 6 units Sugar >251-300 - 10 units and call office. Patient taking differently: Inject 4-10 Units into the skin See admin instructions. Insulin (  Novolog) sliding scale for additional Novolog: administer 37minutes before meals. Sugar <150 - no units Sugar >151-200 - 4 units Sugar >200-250 - 6 units Sugar >251-300 - 10 units and call office. 11/22/21  Yes Nche, Charlene Brooke, NP  losartan (COZAAR) 50 MG tablet Take 1 tablet (50 mg total) by mouth daily. 11/15/21 12/15/21 Yes Elgergawy, Silver Huguenin, MD  pantoprazole (PROTONIX) 40 MG tablet  Take 1 tablet (40 mg total) by mouth 2 (two) times daily. 11/15/21  Yes Elgergawy, Silver Huguenin, MD  TRESIBA FLEXTOUCH 200 UNIT/ML FlexTouch Pen Inject 32 Units into the skin 2 (two) times daily. 11/22/21  Yes Nche, Charlene Brooke, NP    Physical Exam: Constitutional: Moderately built and nourished. Vitals:   12/06/21 2228 12/06/21 2230 12/07/21 0000 12/07/21 0051  BP: (!) 161/74 (!) 164/75 (!) 143/73 (!) 159/79  Pulse: 92 90 90 91  Resp: 13 (!) 21 11 18   Temp:      TempSrc:      SpO2: 98% 97% 93% 92%   Eyes: Anicteric no pallor. ENMT: No discharge from the ears eyes nose and mouth. Neck: Tracheostomy tube seen. Respiratory: No rhonchi or crepitations. Cardiovascular: S1-S2 heard. Abdomen: Soft nontender bowel sound present. Musculoskeletal: No edema. Skin: No rash. Neurologic: Alert awake oriented to time place and person.  Moves all extremities. Psychiatric: Appears normal.  Normal affect.   Labs on Admission: I have personally reviewed following labs and imaging studies  CBC: Recent Labs  Lab 12/06/21 2147  WBC 14.2*  NEUTROABS 12.1*  HGB 9.4*  HCT 30.2*  MCV 95.6  PLT 426   Basic Metabolic Panel: Recent Labs  Lab 12/06/21 2147  NA 137  K 5.0  CL 106  CO2 19*  GLUCOSE 397*  BUN 36*  CREATININE 2.35*  CALCIUM 9.6   GFR: CrCl cannot be calculated (Unknown ideal weight.). Liver Function Tests: Recent Labs  Lab 12/06/21 2147  AST 14*  ALT 14  ALKPHOS 111  BILITOT 0.6  PROT 6.5  ALBUMIN 3.3*   No results for input(s): LIPASE, AMYLASE in the last 168 hours. No results for input(s): AMMONIA in the last 168 hours. Coagulation Profile: Recent Labs  Lab 12/06/21 2147  INR 1.0   Cardiac Enzymes: No results for input(s): CKTOTAL, CKMB, CKMBINDEX, TROPONINI in the last 168 hours. BNP (last 3 results) No results for input(s): PROBNP in the last 8760 hours. HbA1C: No results for input(s): HGBA1C in the last 72 hours. CBG: No results for input(s): GLUCAP  in the last 168 hours. Lipid Profile: No results for input(s): CHOL, HDL, LDLCALC, TRIG, CHOLHDL, LDLDIRECT in the last 72 hours. Thyroid Function Tests: No results for input(s): TSH, T4TOTAL, FREET4, T3FREE, THYROIDAB in the last 72 hours. Anemia Panel: No results for input(s): VITAMINB12, FOLATE, FERRITIN, TIBC, IRON, RETICCTPCT in the last 72 hours. Urine analysis:    Component Value Date/Time   COLORURINE YELLOW 10/14/2021 1422   APPEARANCEUR CLOUDY (A) 10/14/2021 1422   LABSPEC 1.016 10/14/2021 1422   PHURINE 5.0 10/14/2021 1422   GLUCOSEU 50 (A) 10/14/2021 1422   HGBUR SMALL (A) 10/14/2021 1422   BILIRUBINUR NEGATIVE 10/14/2021 1422   KETONESUR NEGATIVE 10/14/2021 1422   PROTEINUR 100 (A) 10/14/2021 1422   NITRITE NEGATIVE 10/14/2021 1422   LEUKOCYTESUR NEGATIVE 10/14/2021 1422   Sepsis Labs: @LABRCNTIP (procalcitonin:4,lacticidven:4) ) Recent Results (from the past 240 hour(s))  Resp Panel by RT-PCR (Flu A&B, Covid) Nasopharyngeal Swab     Status: None   Collection Time: 12/06/21  9:23 PM  Specimen: Nasopharyngeal Swab; Nasopharyngeal(NP) swabs in vial transport medium  Result Value Ref Range Status   SARS Coronavirus 2 by RT PCR NEGATIVE NEGATIVE Final    Comment: (NOTE) SARS-CoV-2 target nucleic acids are NOT DETECTED.  The SARS-CoV-2 RNA is generally detectable in upper respiratory specimens during the acute phase of infection. The lowest concentration of SARS-CoV-2 viral copies this assay can detect is 138 copies/mL. A negative result does not preclude SARS-Cov-2 infection and should not be used as the sole basis for treatment or other patient management decisions. A negative result may occur with  improper specimen collection/handling, submission of specimen other than nasopharyngeal swab, presence of viral mutation(s) within the areas targeted by this assay, and inadequate number of viral copies(<138 copies/mL). A negative result must be combined with clinical  observations, patient history, and epidemiological information. The expected result is Negative.  Fact Sheet for Patients:  EntrepreneurPulse.com.au  Fact Sheet for Healthcare Providers:  IncredibleEmployment.be  This test is no t yet approved or cleared by the [place] FDA and  has been authorized for detection and/or diagnosis of SARS-CoV-2 by FDA under an Emergency Use Authorization (EUA). This EUA will remain  in effect (meaning this test can be used) for the duration of the COVID-19 declaration under Section 564(b)(1) of the Act, 21 U.S.C.section 360bbb-3(b)(1), unless the authorization is terminated  or revoked sooner.       Influenza A by PCR NEGATIVE NEGATIVE Final   Influenza B by PCR NEGATIVE NEGATIVE Final    Comment: (NOTE) The Xpert Xpress SARS-CoV-2/FLU/RSV plus assay is intended as an aid in the diagnosis of influenza from Nasopharyngeal swab specimens and should not be used as a sole basis for treatment. Nasal washings and aspirates are unacceptable for Xpert Xpress SARS-CoV-2/FLU/RSV testing.  Fact Sheet for Patients: EntrepreneurPulse.com.au  Fact Sheet for Healthcare Providers: IncredibleEmployment.be  This test is not yet approved or cleared by the [place] FDA and has been authorized for detection and/or diagnosis of SARS-CoV-2 by FDA under an Emergency Use Authorization (EUA). This EUA will remain in effect (meaning this test can be used) for the duration of the COVID-19 declaration under Section 564(b)(1) of the Act, 21 U.S.C. section 360bbb-3(b)(1), unless the authorization is terminated or revoked.  Performed at [facility], [place] [address] [place], [place] [address]   Culture, Respiratory w Gram Stain     Status: None (Preliminary result)   Collection Time: 12/06/21 10:30 PM   Specimen: Tracheal Aspirate  Result Value Ref Range Status   Specimen Description  TRACHEAL ASPIRATE  Final   Special Requests NONE  Final   Gram Stain   Final    MODERATE WBC PRESENT, PREDOMINANTLY MONONUCLEAR FEW GRAM POSITIVE COCCI IN PAIRS RARE GRAM POSITIVE RODS Performed at [facility], [address], [place], [place] [address]    Culture PENDING  Incomplete   Report Status PENDING  Incomplete     Radiological Exams on Admission: DG Chest Port 1 View  Result Date: 12/06/2021 CLINICAL DATA:  Sepsis workup. EXAM: PORTABLE CHEST 1 VIEW COMPARISON:  Portable chest 10/16/2021. FINDINGS: Interval removal of prior feeding tube and right PICC. Tracheostomy cannula remains in place common not significantly changed. There are multiple overlying monitor wires, more so on the right. There is mild-to-moderate cardiomegaly with normal caliber central vasculature. There is a low inspiratory effort , with asymmetric increased opacity in the left base which could be asymmetric atelectasis, pneumonia or aspiration. Remaining lungs are clear. No pleural  effusion is seen. There are calcifications in the aortic arch. Osteopenia and thoracic spondylosis. IMPRESSION: Low-inspiration study with asymmetric opacity left base, which could be asymmetric atelectasis, pneumonia, or aspiration. Follow-up study recommended in full inspiration. Electronically Signed   By: Telford Nab M.D.   On: 12/06/2021 22:12    EKG: Independently reviewed.  Normal sinus rhythm with RBBB.  Assessment/Plan Principal Problem:   Acute respiratory failure with hypoxia (HCC) Active Problems:   Tracheostomy tube present (HCC)   Essential hypertension   OSA (obstructive sleep apnea)   DM (diabetes mellitus) (HCC)   CKD (chronic kidney disease) stage 4, GFR 15-29 ml/min (HCC)   Pneumonia   Acute respiratory failure with hypoxemia (HCC)    Acute respiratory failure with hypoxia presently on 40% trach collar likely secondary to aspiration and clogged tracheostomy tube which has been suction and aspiration  has improved.  Chest x-ray does show infiltrates concerning for pneumonia for which patient is on antibiotics.  We will keep patient on nebulizer and frequent suctioning.  Holter monitor. Hypertension controlled on amlodipine Coreg and hydralazine and as needed IV hydralazine follow blood pressure trends. Diabetes mellitus type 2 with hyperglycemia on Tresiba 32 units twice daily.  Last hemoglobin A1c was 12.72 months ago. Chronic kidney disease stage IV creatinine appears to be at baseline. Anemia appears to be chronic hemoglobin is at baseline. History of OSA presently on trach collar. History of asthma we will continue with nebulizer.   DVT prophylaxis: Lovenox. Code Status: Full code. Family Communication: Discussed with patient. Disposition Plan: Home. Consults called: ER physician discussed with pulmonologist. Admission status: Observation.   Rise Patience MD [facility] Pager [phone].  If 7PM-7AM, please contact night-coverage [email or web address] Password [email or web address]  12/07/2021, 1:08 AM

## 2021-12-08 ENCOUNTER — Telehealth: Payer: Self-pay | Admitting: Nurse Practitioner

## 2021-12-08 ENCOUNTER — Inpatient Hospital Stay (HOSPITAL_COMMUNITY): Payer: Medicare Other

## 2021-12-08 LAB — BASIC METABOLIC PANEL
Anion gap: 8 (ref 5–15)
BUN: 25 mg/dL — ABNORMAL HIGH (ref 6–20)
CO2: 26 mmol/L (ref 22–32)
Calcium: 9.8 mg/dL (ref 8.9–10.3)
Chloride: 107 mmol/L (ref 98–111)
Creatinine, Ser: 2 mg/dL — ABNORMAL HIGH (ref 0.44–1.00)
GFR, Estimated: 29 mL/min — ABNORMAL LOW (ref >60)
Glucose, Bld: 158 mg/dL — ABNORMAL HIGH (ref 70–99)
Potassium: 4.2 mmol/L (ref 3.5–5.1)
Sodium: 141 mmol/L (ref 135–145)

## 2021-12-08 LAB — CBC
HCT: 29.9 % — ABNORMAL LOW (ref 36.0–46.0)
Hemoglobin: 9.1 g/dL — ABNORMAL LOW (ref 12.0–15.0)
MCH: 28.3 pg (ref 26.0–34.0)
MCHC: 30.4 g/dL (ref 30.0–36.0)
MCV: 93.1 fL (ref 80.0–100.0)
Platelets: 221 10*3/uL (ref 150–400)
RBC: 3.21 MIL/uL — ABNORMAL LOW (ref 3.87–5.11)
RDW: 13.8 % (ref 11.5–15.5)
WBC: 10.1 10*3/uL (ref 4.0–10.5)
nRBC: 0 % (ref 0.0–0.2)

## 2021-12-08 LAB — GLUCOSE, CAPILLARY
Glucose-Capillary: 126 mg/dL — ABNORMAL HIGH (ref 70–99)
Glucose-Capillary: 128 mg/dL — ABNORMAL HIGH (ref 70–99)
Glucose-Capillary: 98 mg/dL (ref 70–99)
Glucose-Capillary: 124 mg/dL — ABNORMAL HIGH (ref 70–99)

## 2021-12-08 LAB — URINE CULTURE: Culture: 10000 — AB

## 2021-12-08 MED ORDER — CLONAZEPAM 0.25 MG PO TBDP
0.2500 mg | ORAL_TABLET | Freq: Once | ORAL | Status: AC
  Administered 2021-12-08: 13:00:00 0.25 mg via ORAL
  Filled 2021-12-08: qty 1

## 2021-12-08 MED ORDER — ONDANSETRON HCL 4 MG/2ML IJ SOLN
INTRAMUSCULAR | Status: AC
  Filled 2021-12-08: qty 2

## 2021-12-08 MED ORDER — ONDANSETRON HCL 4 MG/2ML IJ SOLN
4.0000 mg | Freq: Four times a day (QID) | INTRAMUSCULAR | Status: DC | PRN
  Administered 2021-12-08 – 2021-12-16 (×4): 4 mg via INTRAVENOUS
  Filled 2021-12-08 (×4): qty 2

## 2021-12-08 MED ORDER — GUAIFENESIN-DM 100-10 MG/5ML PO SYRP
5.0000 mL | ORAL_SOLUTION | ORAL | Status: DC | PRN
  Administered 2021-12-08 – 2021-12-09 (×3): 5 mL via ORAL
  Filled 2021-12-08 (×4): qty 10

## 2021-12-08 MED ORDER — AZITHROMYCIN 500 MG PO TABS
500.0000 mg | ORAL_TABLET | Freq: Every day | ORAL | Status: AC
  Administered 2021-12-08 – 2021-12-09 (×3): 500 mg via ORAL
  Filled 2021-12-08 (×3): qty 1

## 2021-12-08 MED ORDER — IPRATROPIUM-ALBUTEROL 0.5-2.5 (3) MG/3ML IN SOLN
3.0000 mL | Freq: Four times a day (QID) | RESPIRATORY_TRACT | Status: DC
  Administered 2021-12-08 – 2021-12-11 (×15): 3 mL via RESPIRATORY_TRACT
  Filled 2021-12-08 (×15): qty 3

## 2021-12-08 NOTE — Evaluation (Signed)
Physical Therapy Evaluation Patient Details Name: Maureen Jones MRN: [id number] DOB: 1966/02/08 Today's Date: 12/08/2021  History of Present Illness  55 y/o female presented to ED on 12/26 for SOB and nausea. Chest x-ray shows infiltrates concerning for aspiration. Found to have blood clot completing occluding airway. Trach changed on 12/28. PMH: HTN, DM, trach.  Clinical Impression  Patient admitted with above findings. Patient presents with generalized weakness, impaired balance, and decreased activity tolerance. Patient on trach collar at 35%. Patient ambulated 250' with no AD and supervision with spO2 >95% throughout. Patient able to make needs known with whiteboard since her PMV is at home. Patient will benefit from skilled PT services during acute stay to address listed deficits. Recommend continuing HHPT at discharge to maximize functional independence and safety.        Recommendations for follow up therapy are one component of a multi-disciplinary discharge planning process, led by the attending physician.  Recommendations may be updated based on patient status, additional functional criteria and insurance authorization.  Follow Up Recommendations Home health PT    Assistance Recommended at Discharge Intermittent Supervision/Assistance  Functional Status Assessment Patient has had a recent decline in their functional status and demonstrates the ability to make significant improvements in function in a reasonable and predictable amount of time.  Equipment Recommendations  None recommended by PT    Recommendations for Other Services       Precautions / Restrictions Precautions Precautions: Fall;Other (comment) Precaution Comments: trach Restrictions Weight Bearing Restrictions: No      Mobility  Bed Mobility Overal bed mobility: Modified Independent                  Transfers Overall transfer level: Needs assistance Equipment used: None Transfers: Sit to/from  Stand Sit to Stand: Supervision           General transfer comment: supervision for safety    Ambulation/Gait Ambulation/Gait assistance: Supervision Gait Distance (Feet): 250 Feet Assistive device: None Gait Pattern/deviations: Step-through pattern Gait velocity: decreased     General Gait Details: supervision for safety. No physical assist required. No LOB noted. Mild drifting  Stairs            Wheelchair Mobility    Modified Rankin (Stroke Patients Only)       Balance Overall balance assessment: Mild deficits observed, not formally tested                                           Pertinent Vitals/Pain Pain Assessment: No/denies pain    Home Living Family/patient expects to be discharged to:: Private residence Living Arrangements: Other relatives Available Help at Discharge: Family;Available 24 hours/day Type of Home: Mobile home Home Access: Stairs to enter Entrance Stairs-Rails: Left (ascending) Entrance Stairs-Number of Steps: 4   Home Layout: One level Home Equipment: None Additional Comments: was receiving HHPT since last admission    Prior Function Prior Level of Function : Independent/Modified Independent             Mobility Comments: required 4L O2 trach collar at home. Ambulated with no AD       Hand Dominance        Extremity/Trunk Assessment   Upper Extremity Assessment Upper Extremity Assessment: Generalized weakness    Lower Extremity Assessment Lower Extremity Assessment: Generalized weakness    Cervical / Trunk Assessment Cervical / Trunk Assessment: Normal  Communication  Communication: Tracheostomy  Cognition Arousal/Alertness: Awake/alert Behavior During Therapy: WFL for tasks assessed/performed Overall Cognitive Status: Within Functional Limits for tasks assessed                                          General Comments General comments (skin integrity, edema, etc.):  On 35% FiO2 trach collar    Exercises     Assessment/Plan    PT Assessment Patient needs continued PT services  PT Problem List Decreased activity tolerance;Decreased balance;Decreased mobility;Decreased coordination;Decreased cognition;Decreased knowledge of use of DME;Decreased safety awareness;Cardiopulmonary status limiting activity;Impaired sensation       PT Treatment Interventions DME instruction;Gait training;Stair training;Functional mobility training;Therapeutic activities;Therapeutic exercise;Balance training    PT Goals (Current goals can be found in the Care Plan section)  Acute Rehab PT Goals Patient Stated Goal: to breathe better PT Goal Formulation: With patient Time For Goal Achievement: 12/22/21 Potential to Achieve Goals: Good    Frequency Min 3X/week   Barriers to discharge        Co-evaluation               AM-PAC PT "6 Clicks" Mobility  Outcome Measure Help needed turning from your back to your side while in a flat bed without using bedrails?: None Help needed moving from lying on your back to sitting on the side of a flat bed without using bedrails?: None Help needed moving to and from a bed to a chair (including a wheelchair)?: A Little Help needed standing up from a chair using your arms (e.g., wheelchair or bedside chair)?: A Little Help needed to walk in hospital room?: A Little Help needed climbing 3-5 steps with a railing? : A Little 6 Click Score: 20    End of Session Equipment Utilized During Treatment: Oxygen;Gait belt Activity Tolerance: Patient tolerated treatment well Patient left: in bed;with call bell/phone within reach Nurse Communication: Mobility status PT Visit Diagnosis: Other abnormalities of gait and mobility (R26.89);Difficulty in walking, not elsewhere classified (R26.2);Muscle weakness (generalized) (M62.81)    Time: 0626-9485 PT Time Calculation (min) (ACUTE ONLY): 21 min   Charges:   PT Evaluation $PT Eval  Moderate Complexity: 1 Mod          Daira Hine A. Gilford Rile PT, DPT Acute Rehabilitation Services Pager [phone] Office [phone]   Linna Hoff 12/08/2021, 4:27 PM

## 2021-12-08 NOTE — Progress Notes (Signed)
Pt has been stating that "airway doesn't feel right" throughout the night according to the pt. Pt could audibly talk with her #5 XLT cuffless proximal trach in place without having a PMV on. 02 requirements were increased overnight to match her needs. RT this AM placed c02 detector on trach and there was no color change. Attending MD/ CCM MD made aware. The decision to bronch at bedside to verify placement was made by CCM. When bronch was performed, there was a huge blood clot found on the end of the trach completely occluding the airway. The trach was then taken out and replaced with a #5 XLT proximal cuffed (deflated) trach. Pt tolerated change well. Rt was able to decrease 02 now that the new trach was in place. Pt has copious bloody secretions, however is stable at this time. Rt will monitor.

## 2021-12-08 NOTE — Telephone Encounter (Signed)
Maureen Jones with [facility] home health called and wanted to follow up on forms faxed over. Call back for [facility] is [phone] ext [phone]

## 2021-12-08 NOTE — Progress Notes (Signed)
RT has been called to room multiple times due to pt stating she feels like she can not breathe. Pt has been suctioned with only a little blood coming out. Neb txs given and also inner cannula has been changed multiple times. Pts VS have been stable. Pt has had a dry cough. Pt is able to talk over the trach. Pt stated she has been doing that for a little while now at home.

## 2021-12-08 NOTE — Procedures (Signed)
Diagnostic Bronchoscopy  Maureen Jones  [id number]  08/10/1966  Date:12/08/21  Time:8:59 AM   Provider Performing:Lysbeth Dicola Rodman Pickle   Procedure: Diagnostic Bronchoscopy [phone])  Indication(s) Tracheostomy inspection  Consent Risks of the procedure as well as the alternatives and risks of each were explained to the patient and/or caregiver.  Consent for the procedure was obtained.   Anesthesia None   Time Out Verified patient identification, verified procedure, site/side was marked, verified correct patient position, special equipment/implants available, medications/allergies/relevant history reviewed, required imaging and test results available.   Sterile Technique Usual hand hygiene, masks and gloves were used   Procedure Description Bronchoscope advanced through tracheostomy. At the end of trach, large blood clot occluding 100% of the lumen was noted that was not amenable to suction. Bronchoscope was removed and trach was exchanged. After suctioning out tracheal secretions, bronchoscope used to provide direct visualization of tracheostomy placement. Carina seen. Trach placement confirmed to be in airway. Bronchoscope removed   Complications/Tolerance None; patient tolerated the procedure well.   EBL None  Specimen(s) None

## 2021-12-08 NOTE — Procedures (Signed)
Tracheostomy Exchange Procedure Note  Maureen Jones  [id number]  [birth year]  Date:12/08/21  Time:9:02 AM   Provider Performing:Noele Icenhour Rodman Pickle   Procedure: Tracheostomy Exchange Through Immature Stoma [phone])  Indication(s) Acute hypoxemic respiratory failure  Consent Risks of the procedure as well as the alternatives and risks of each were explained to the patient and/or caregiver.  Consent for the procedure was obtained and is signed in the bedside chart  Anesthesia None   Time Out Verified patient identification, verified procedure, site/side was marked, verified correct patient position, special equipment/implants available, medications/allergies/relevant history reviewed, required imaging and test results available.   Sterile Technique Hand hygiene, gloves   Procedure Description Size 5  uncuffed existing Shiley removed and size 5 cuffed proximal XLT Shiley placed through stoma. Old bloody clotted secretions suctioned and removed. Placement confirmed with bronchoscope.   Complications/Tolerance None; patient tolerated the procedure well..   EBL Minimal

## 2021-12-08 NOTE — Progress Notes (Signed)
Responded to consult to visit and pray with patient.  Upon arrival other staff was providing care and support to patient.  I prayed with patient., provided empathetic listening and emotional support.  Jaclynn Major, [place], [facility], Pager [phone]

## 2021-12-08 NOTE — Plan of Care (Signed)
  Problem: Education: Goal: Knowledge of General Education information will improve Description: Including pain rating scale, medication(s)/side effects and non-pharmacologic comfort measures Outcome: Not Progressing   Problem: Health Behavior/Discharge Planning: Goal: Ability to manage health-related needs will improve Outcome: Not Progressing   Problem: Clinical Measurements: Goal: Ability to maintain clinical measurements within normal limits will improve Outcome: Not Progressing Goal: Will remain free from infection Outcome: Not Progressing Goal: Diagnostic test results will improve Outcome: Not Progressing Goal: Respiratory complications will improve Outcome: Not Progressing Goal: Cardiovascular complication will be avoided Outcome: Not Progressing   Problem: Nutrition: Goal: Adequate nutrition will be maintained Outcome: Not Progressing   Problem: Coping: Goal: Level of anxiety will decrease Outcome: Not Progressing   Problem: Elimination: Goal: Will not experience complications related to bowel motility Outcome: Not Progressing Goal: Will not experience complications related to urinary retention Outcome: Not Progressing   Problem: Pain Managment: Goal: General experience of comfort will improve Outcome: Not Progressing   Problem: Safety: Goal: Ability to remain free from injury will improve Outcome: Not Progressing   Problem: Skin Integrity: Goal: Risk for impaired skin integrity will decrease Outcome: Not Progressing   

## 2021-12-08 NOTE — Progress Notes (Signed)
PHARMACIST - PHYSICIAN COMMUNICATION DR:   Vann CONCERNING: Antibiotic IV to Oral Route Change Policy  RECOMMENDATION: This patient is receiving azithromycin by the intravenous route.  Based on criteria approved by the Pharmacy and Therapeutics Committee, the antibiotic(s) is/are being converted to the equivalent oral dose form(s).   DESCRIPTION: These criteria include:  Patient being treated for a respiratory tract infection, urinary tract infection, cellulitis or clostridium difficile associated diarrhea if on metronidazole  The patient is not neutropenic and does not exhibit a GI malabsorption state  The patient is eating (either orally or via tube) and/or has been taking other orally administered medications for a least 24 hours  The patient is improving clinically and has a Tmax < 100.5  If you have questions about this conversion, please contact the Pharmacy Department  []  ( [phone] )  [place] []  ( [phone]  [facility] [x]  ( [phone]  [place] []  ( [phone]  [facility] []  ( [phone] )  [facility]   

## 2021-12-08 NOTE — Consult Note (Signed)
NAME:  Maureen Jones, MRN:  [id number], DOB:  1966-06-04, LOS: 1 ADMISSION DATE:  12/06/2021, CONSULTATION DATE:  12/08/21 REFERRING MD:  Eulogio Bear, DO CHIEF COMPLAINT:  Acute hypoxemic respiratory failure   History of Present Illness:  55 year old female former smoker with asthma s/p tracheostomy 10/05/21 secondary to supraglottic edema and VCD admitted for AHRF secondary to aspiration and clogged tracheostomy that was suctioned. Started on antibiotics and nebulizers for presumed pneumonia. Prior to hospitalization patient reported running out of respiratory supplies x 1 week.  Overnight on 12/28,, RT was called to bedside due to dyspnea. Trach suctioned with minimal blood secretions. Inner cannula was changed out without improvement. This AM FIO2 increased to 60% however patient continues to be in respiratory distress. Consulted by primary team for worsening acute hypoxemic respiratory failure  Of note, patient had recent prolonged hospitalization earlier this month (Oct/Nov 2022) for failed extubation due to supraglottic edema requiring tracheostomy. During this admission had PEA arrest (11/2) after dislodged trach. Redo tracheostomy with 6 XLT. Was changed to cuffless 5 proximal XLT on 10/25/21. Pertinent  Medical History  S/p trach secondary to supraglottic edema and VCD, asthma, HTN, DM2, CKD stage IV, OSA, chronic anemia  Significant Hospital Events: Including procedures, antibiotic start and stop dates in addition to other pertinent events   12/27 Admitted by [facility] for increased O2 with FIO2 40%, abx and nebs. Cuffless #5 prox XLT in place 12/28 Overnight, worsening respiratory status. PCCM consulted and exchanged trach to cuffed #5 proximal XLT due to clogged trach  Interim History / Subjective:  As above  Objective   Blood pressure (!) 172/84, pulse 88, temperature 98.8 F (37.1 C), temperature source Oral, resp. rate 15, SpO2 92 %.    FiO2 (%):  [35 %-60 %] 60 %    Intake/Output Summary (Last 24 hours) at 12/08/2021 3710 Last data filed at 12/08/2021 0500 Gross per 24 hour  Intake 350 ml  Output 1900 ml  Net -1550 ml   There were no vitals filed for this visit.  Physical Exam: General: Chronically ill-appearing, respiratory distress HENT: [place], AT, OP clear, MMM Neck: Trach in place Eyes: EOMI, no scleral icterus Respiratory: Anterior rhonchi bilaterally. No wheezing Cardiovascular: RRR, -M/R/G, no JVD Extremities:-Edema,-tenderness Neuro: AAO x4, CNII-XII grossly intact Psych: Normal mood, normal affect  CXR 12/06/21 - Cardiomegaly. Left basilar atelectasis  Resolved Hospital Problem list     Assessment & Plan:   Acute hypoxemic respiratory failure 2/2 aspiration +/- pneumonia, hemoptysis Hemoptysis secondary to frequent suctioning S/p tracheostomy Hx asthma, OSA Coordinated care with primary team, RT and RN for patient in respiratory distress. PCO2 colorimetric with no change. Diagnostic trach bronch performed at bedside. Thick blood clot present not amenable to suction. Exchanged trach for cuffed #5 proximal XLT. Immediate improvement in oxygenation s/p exchange. Old blood clots present however no signs of active bleeding. --Wean supplemental O2 for goal SpO2 >90% --Scheduled nebs: Pulmicort, Duonebs --Abx per primary team --No indication for steroids. Doubt asthma exacerbation at this time --Routine trach care --Remain in progressive bed. No indication transfer to ICU now that acute issue has resolved --Continue to monitor for hemoptysis, respiratory distress, worsening oxygenation --CXR   Best Practice (right click and "Reselect all SmartList Selections" daily)   Diet/type: Regular consistency (see orders) DVT prophylaxis: not indicated Hold for hemoptysis. Reassess tomorrow GI prophylaxis: PPI Lines: N/A Foley:  N/A Code Status:  limited. No chest compressions or shocks. Intubation and ACLS drugs OK Last date of  multidisciplinary goals of care discussion [12/08/21]  Labs   CBC: Recent Labs  Lab 12/06/21 2147 12/07/21 0610 12/08/21 0230  WBC 14.2* 13.4* 10.1  NEUTROABS 12.1*  --   --   HGB 9.4* 8.9* 9.1*  HCT 30.2* 29.3* 29.9*  MCV 95.6 95.1 93.1  PLT 205 213 884    Basic Metabolic Panel: Recent Labs  Lab 12/06/21 2147 12/07/21 0610 12/08/21 0230  NA 137 138 141  K 5.0 4.9 4.2  CL 106 106 107  CO2 19* 25 26  GLUCOSE 397* 337* 158*  BUN 36* 34* 25*  CREATININE 2.35* 2.40* 2.00*  CALCIUM 9.6 9.4 9.8  MG  --  1.7  --    GFR: CrCl cannot be calculated (Unknown ideal weight.). Recent Labs  Lab 12/06/21 2147 12/07/21 0610 12/08/21 0230  WBC 14.2* 13.4* 10.1  LATICACIDVEN 1.2  --   --     Liver Function Tests: Recent Labs  Lab 12/06/21 2147  AST 14*  ALT 14  ALKPHOS 111  BILITOT 0.6  PROT 6.5  ALBUMIN 3.3*   No results for input(s): LIPASE, AMYLASE in the last 168 hours. No results for input(s): AMMONIA in the last 168 hours.  ABG    Component Value Date/Time   PHART 7.404 10/13/2021 1429   PCO2ART 45.9 10/13/2021 1429   PO2ART 174 (H) 10/13/2021 1429   HCO3 28.7 (H) 10/13/2021 1429   TCO2 30 10/13/2021 1429   ACIDBASEDEF 1.0 10/05/2021 0937   O2SAT 100.0 10/13/2021 1429     Coagulation Profile: Recent Labs  Lab 12/06/21 2147  INR 1.0    Cardiac Enzymes: No results for input(s): CKTOTAL, CKMB, CKMBINDEX, TROPONINI in the last 168 hours.  HbA1C: Hgb A1c MFr Bld  Date/Time Value Ref Range Status  09/20/2021 12:02 PM 12.7 (H) 4.8 - 5.6 % Final    Comment:    (NOTE) Pre diabetes:          5.7%-6.4%  Diabetes:              >6.4%  Glycemic control for   <7.0% adults with diabetes     CBG: Recent Labs  Lab 12/07/21 0832 12/07/21 1205 12/07/21 1717 12/07/21 2101 12/08/21 0700  GLUCAP 285* 223* 191* 196* 126*    Review of Systems:   Shortness of breath  Past Medical History:  She,  has a past medical history of Asthma, Diabetes  ([place]), Glottic and subglottic edema (2022), and Hypertension.   Surgical History:  History reviewed. No pertinent surgical history.   Social History:   reports that she has quit smoking. Her smoking use included cigarettes. She has never used smokeless tobacco. She reports that she does not drink alcohol and does not use drugs.   Family History:  Her Family history is unknown by patient.   Allergies No Known Allergies   Home Medications  Prior to Admission medications   Medication Sig Start Date End Date Taking? Authorizing Provider  albuterol (PROVENTIL) (2.5 MG/3ML) 0.083% nebulizer solution Use 1 vial (2.5 mg total) by nebulization every 4 (four) hours as needed for wheezing or shortness of breath. 11/15/21  Yes Elgergawy, Silver Huguenin, MD  amLODipine (NORVASC) 10 MG tablet Take 1 tablet (10 mg total) by mouth daily. 11/16/21  Yes Elgergawy, Silver Huguenin, MD  aspirin EC 81 MG tablet Take 1 tablet (81 mg total) by mouth daily. Swallow whole. 11/15/21 12/15/21 Yes Elgergawy, Silver Huguenin, MD  atorvastatin (LIPITOR) 20 MG tablet Take 1 tablet (20 mg total)  by mouth daily. 11/16/21  Yes Elgergawy, Silver Huguenin, MD  budesonide (PULMICORT) 0.25 MG/2ML nebulizer solution Use 1 vial (0.25 mg total) by nebulization 2 (two) times daily. 11/15/21  Yes Elgergawy, Silver Huguenin, MD  carvedilol (COREG) 25 MG tablet Take 1 tablet (25 mg total) by mouth 2 (two) times daily. 11/15/21  Yes Elgergawy, Silver Huguenin, MD  clonazePAM (KLONOPIN) 0.5 MG tablet Take 1/2 tablet (0.25 mg total) by mouth daily at bedtime. 11/15/21  Yes Elgergawy, Silver Huguenin, MD  DULoxetine (CYMBALTA) 60 MG capsule Take 1 capsule (60 mg total) by mouth daily. 11/22/21  Yes Nche, Charlene Brooke, NP  hydrALAZINE (APRESOLINE) 100 MG tablet Take 1 tablet (100 mg total) by mouth 3 (three) times daily. 11/15/21  Yes Elgergawy, Silver Huguenin, MD  insulin aspart (NOVOLOG) 100 UNIT/ML injection Insulin (Novolog) sliding scale for additional Novolog: administer 63minutes before  meals. Sugar <150 - no units Sugar >151-200 - 4 units Sugar >200-250 - 6 units Sugar >251-300 - 10 units and call office. Patient taking differently: Inject 4-10 Units into the skin See admin instructions. Insulin (Novolog) sliding scale for additional Novolog: administer 17minutes before meals. Sugar <150 - no units Sugar >151-200 - 4 units Sugar >200-250 - 6 units Sugar >251-300 - 10 units and call office. 11/22/21  Yes Nche, Charlene Brooke, NP  losartan (COZAAR) 50 MG tablet Take 1 tablet (50 mg total) by mouth daily. 11/15/21 12/15/21 Yes Elgergawy, Silver Huguenin, MD  pantoprazole (PROTONIX) 40 MG tablet Take 1 tablet (40 mg total) by mouth 2 (two) times daily. 11/15/21  Yes Elgergawy, Silver Huguenin, MD  TRESIBA FLEXTOUCH 200 UNIT/ML FlexTouch Pen Inject 32 Units into the skin 2 (two) times daily. 11/22/21  Yes Nche, Charlene Brooke, NP     Critical care time: 31 min    Rodman Pickle, M.D. [facility] 12/08/2021 8:23 AM   See Amion for personal pager For hours between 7 PM to 7 AM, please call Elink for urgent questions

## 2021-12-08 NOTE — Progress Notes (Signed)
Progress Note    Maureen Jones  YOV:[id number] DOB: 10/16/1966  DOA: 12/06/2021 PCP: Flossie Buffy, NP    Brief Narrative:     Medical records reviewed and are as summarized below:  Maureen Jones is an 55 y.o. female with history of recent admission for respiratory failure in the setting of hypertensive emergency and hypoxia due to vocal cord dysfunction and supraglottic edema and asthma exacerbation was initially intubated and was on ventilator and subsequently had to have a tracheostomy was discharged on November 15, 2021.  She has been out of her suction supplies for about 1 week.    Assessment/Plan:   Principal Problem:   Acute respiratory failure with hypoxia (HCC) Active Problems:   Tracheostomy tube present (HCC)   Essential hypertension   OSA (obstructive sleep apnea)   DM (diabetes mellitus) (HCC)   CKD (chronic kidney disease) stage 4, GFR 15-29 ml/min (HCC)   Pneumonia   Acute respiratory failure with hypoxemia (HCC)    Acute respiratory failure with hypoxia  - likely secondary to aspiration and clogged tracheostomy tube which has been suction and aspiration has improved.   -Chest x-ray does show infiltrates concerning for pneumonia so will continue abc -nebulizer and frequent suctioning -PCCM consult to exchange trach -x ray 12/28: improving atelectasis  Hypertension  - on amlodipine Coreg and hydralazine   Diabetes mellitus type 2 with hyperglycemia  -on Tresiba 32 units twice daily.  -SSI  Chronic kidney disease stage IV  -creatinine appears to be at baseline. -around 2  Anemia  -appears to be chronic hemoglobin is at baseline. -unclear kind but prob CKD  History of OSA  -presently on trach collar.  History of asthma  - continue with nebulizer.  Hypomagnesemia -replete   Family Communication/Anticipated D/C date and plan/Code Status   DVT prophylaxis: Lovenox ordered. Code Status: partial Disposition Plan: Status is:  Inpatient  Remains inpatient appropriate because: wean off O2         Medical Consultants:   PCCM  Subjective:   Needing to use the bathroom Breathing better  Objective:    Vitals:   12/08/21 0512 12/08/21 0719 12/08/21 1103 12/08/21 1133  BP:  (!) 172/84 (!) 97/46 137/69  Pulse:  88 86 84  Resp:  15 14 14   Temp:  98.8 F (37.1 C) 98.8 F (37.1 C)   TempSrc:  Oral Oral   SpO2: 93% 92% 96% 95%    Intake/Output Summary (Last 24 hours) at 12/08/2021 1428 Last data filed at 12/08/2021 7412 Gross per 24 hour  Intake 590 ml  Output 1900 ml  Net -1310 ml   There were no vitals filed for this visit.  Exam:  General: Appearance:    Obese female in no acute distress     Lungs:     Trach in place, respirations unlabored  Heart:    Normal heart rate.    MS:   All extremities are intact.    Neurologic:   Awake, alert- able to communicate with a white board     Data Reviewed:   I have personally reviewed following labs and imaging studies:  Labs: Labs show the following:   Basic Metabolic Panel: Recent Labs  Lab 12/06/21 2147 12/07/21 0610 12/08/21 0230  NA 137 138 141  K 5.0 4.9 4.2  CL 106 106 107  CO2 19* 25 26  GLUCOSE 397* 337* 158*  BUN 36* 34* 25*  CREATININE 2.35* 2.40* 2.00*  CALCIUM 9.6 9.4 9.8  MG  --  1.7  --    GFR CrCl cannot be calculated (Unknown ideal weight.). Liver Function Tests: Recent Labs  Lab 12/06/21 2147  AST 14*  ALT 14  ALKPHOS 111  BILITOT 0.6  PROT 6.5  ALBUMIN 3.3*   No results for input(s): LIPASE, AMYLASE in the last 168 hours. No results for input(s): AMMONIA in the last 168 hours. Coagulation profile Recent Labs  Lab 12/06/21 2147  INR 1.0    CBC: Recent Labs  Lab 12/06/21 2147 12/07/21 0610 12/08/21 0230  WBC 14.2* 13.4* 10.1  NEUTROABS 12.1*  --   --   HGB 9.4* 8.9* 9.1*  HCT 30.2* 29.3* 29.9*  MCV 95.6 95.1 93.1  PLT 205 213 221   Cardiac Enzymes: No results for input(s): CKTOTAL,  CKMB, CKMBINDEX, TROPONINI in the last 168 hours. BNP (last 3 results) No results for input(s): PROBNP in the last 8760 hours. CBG: Recent Labs  Lab 12/07/21 1205 12/07/21 1717 12/07/21 2101 12/08/21 0700 12/08/21 1113  GLUCAP 223* 191* 196* 126* 128*   D-Dimer: No results for input(s): DDIMER in the last 72 hours. Hgb A1c: No results for input(s): HGBA1C in the last 72 hours. Lipid Profile: No results for input(s): CHOL, HDL, LDLCALC, TRIG, CHOLHDL, LDLDIRECT in the last 72 hours. Thyroid function studies: No results for input(s): TSH, T4TOTAL, T3FREE, THYROIDAB in the last 72 hours.  Invalid input(s): FREET3 Anemia work up: No results for input(s): VITAMINB12, FOLATE, FERRITIN, TIBC, IRON, RETICCTPCT in the last 72 hours. Sepsis Labs: Recent Labs  Lab 12/06/21 2147 12/07/21 0610 12/08/21 0230  WBC 14.2* 13.4* 10.1  LATICACIDVEN 1.2  --   --     Microbiology Recent Results (from the past 240 hour(s))  Resp Panel by RT-PCR (Flu A&B, Covid) Nasopharyngeal Swab     Status: None   Collection Time: 12/06/21  9:23 PM   Specimen: Nasopharyngeal Swab; Nasopharyngeal(NP) swabs in vial transport medium  Result Value Ref Range Status   SARS Coronavirus 2 by RT PCR NEGATIVE NEGATIVE Final    Comment: (NOTE) SARS-CoV-2 target nucleic acids are NOT DETECTED.  The SARS-CoV-2 RNA is generally detectable in upper respiratory specimens during the acute phase of infection. The lowest concentration of SARS-CoV-2 viral copies this assay can detect is 138 copies/mL. A negative result does not preclude SARS-Cov-2 infection and should not be used as the sole basis for treatment or other patient management decisions. A negative result may occur with  improper specimen collection/handling, submission of specimen other than nasopharyngeal swab, presence of viral mutation(s) within the areas targeted by this assay, and inadequate number of viral copies(<138 copies/mL). A negative result  must be combined with clinical observations, patient history, and epidemiological information. The expected result is Negative.  Fact Sheet for Patients:  EntrepreneurPulse.com.au  Fact Sheet for Healthcare Providers:  IncredibleEmployment.be  This test is no t yet approved or cleared by the [place] FDA and  has been authorized for detection and/or diagnosis of SARS-CoV-2 by FDA under an Emergency Use Authorization (EUA). This EUA will remain  in effect (meaning this test can be used) for the duration of the COVID-19 declaration under Section 564(b)(1) of the Act, 21 U.S.C.section 360bbb-3(b)(1), unless the authorization is terminated  or revoked sooner.       Influenza A by PCR NEGATIVE NEGATIVE Final   Influenza B by PCR NEGATIVE NEGATIVE Final    Comment: (NOTE) The Xpert Xpress SARS-CoV-2/FLU/RSV plus assay is intended as an aid in the diagnosis  of influenza from Nasopharyngeal swab specimens and should not be used as a sole basis for treatment. Nasal washings and aspirates are unacceptable for Xpert Xpress SARS-CoV-2/FLU/RSV testing.  Fact Sheet for Patients: EntrepreneurPulse.com.au  Fact Sheet for Healthcare Providers: IncredibleEmployment.be  This test is not yet approved or cleared by the [place] FDA and has been authorized for detection and/or diagnosis of SARS-CoV-2 by FDA under an Emergency Use Authorization (EUA). This EUA will remain in effect (meaning this test can be used) for the duration of the COVID-19 declaration under Section 564(b)(1) of the Act, 21 U.S.C. section 360bbb-3(b)(1), unless the authorization is terminated or revoked.  Performed at [facility], [place] [address], [place], [place] [address]   Blood Culture (routine x 2)     Status: None (Preliminary result)   Collection Time: 12/06/21  9:23 PM   Specimen: BLOOD RIGHT HAND  Result Value Ref Range  Status   Specimen Description BLOOD RIGHT HAND  Final   Special Requests   Final    BOTTLES DRAWN AEROBIC AND ANAEROBIC Blood Culture adequate volume   Culture   Final    NO GROWTH 2 DAYS Performed at [facility], [place] [address] [place], [place] [address]    Report Status PENDING  Incomplete  Blood Culture (routine x 2)     Status: None (Preliminary result)   Collection Time: 12/06/21  9:40 PM   Specimen: BLOOD LEFT FOREARM  Result Value Ref Range Status   Specimen Description BLOOD LEFT FOREARM  Final   Special Requests   Final    BOTTLES DRAWN AEROBIC AND ANAEROBIC Blood Culture adequate volume   Culture   Final    NO GROWTH 2 DAYS Performed at [facility], [place] [address] [place], [place] [address]    Report Status PENDING  Incomplete  Culture, Respiratory w Gram Stain     Status: None (Preliminary result)   Collection Time: 12/06/21 10:30 PM   Specimen: Tracheal Aspirate  Result Value Ref Range Status   Specimen Description TRACHEAL ASPIRATE  Final   Special Requests NONE  Final   Gram Stain   Final    MODERATE WBC PRESENT, PREDOMINANTLY MONONUCLEAR FEW GRAM POSITIVE COCCI IN PAIRS RARE GRAM POSITIVE RODS    Culture   Final    CULTURE REINCUBATED FOR BETTER GROWTH Performed at [facility], [place] [address], [place], [place] [address]    Report Status PENDING  Incomplete  Urine Culture     Status: Abnormal   Collection Time: 12/07/21 12:11 PM   Specimen: In/Out Cath Urine  Result Value Ref Range Status   Specimen Description IN/OUT CATH URINE  Final   Special Requests   Final    NONE Performed at [facility], [place] [address], [place], [place] [address]    Culture (A)  Final    10,000 COLONIES/mL MULTIPLE SPECIES PRESENT, SUGGEST RECOLLECTION   Report Status 12/08/2021 FINAL  Final    Procedures and diagnostic studies:  DG CHEST PORT 1 VIEW  Result Date: 12/08/2021 CLINICAL DATA:  Hypoxemia. EXAM: PORTABLE CHEST 1 VIEW COMPARISON:   12/06/2021 FINDINGS: Poor inspiration with mild improvement. Stable mildly enlarged cardiac silhouette. Decreased left basilar atelectasis. Clear right lung. Thoracic spine degenerative changes. IMPRESSION: Improving left basilar atelectasis. Electronically Signed   By: Claudie Revering M.D.   On: 12/08/2021 09:21   DG Chest Port 1 View  Result Date: 12/06/2021 CLINICAL DATA:  Sepsis workup. EXAM: PORTABLE  CHEST 1 VIEW COMPARISON:  Portable chest 10/16/2021. FINDINGS: Interval removal of prior feeding tube and right PICC. Tracheostomy cannula remains in place common not significantly changed. There are multiple overlying monitor wires, more so on the right. There is mild-to-moderate cardiomegaly with normal caliber central vasculature. There is a low inspiratory effort , with asymmetric increased opacity in the left base which could be asymmetric atelectasis, pneumonia or aspiration. Remaining lungs are clear. No pleural effusion is seen. There are calcifications in the aortic arch. Osteopenia and thoracic spondylosis. IMPRESSION: Low-inspiration study with asymmetric opacity left base, which could be asymmetric atelectasis, pneumonia, or aspiration. Follow-up study recommended in full inspiration. Electronically Signed   By: Telford Nab M.D.   On: 12/06/2021 22:12    Medications:    amLODipine  10 mg Oral Daily   aspirin EC  81 mg Oral Daily   atorvastatin  20 mg Oral Daily   azithromycin  500 mg Oral q1800   budesonide  0.25 mg Nebulization BID   carvedilol  25 mg Oral BID   clonazepam  0.25 mg Oral QHS   DULoxetine  60 mg Oral Daily   enoxaparin (LOVENOX) injection  40 mg Subcutaneous Q24H   fluticasone  2 spray Each Nare Daily   guaiFENesin  600 mg Oral BID   hydrALAZINE  100 mg Oral TID   insulin aspart  0-15 Units Subcutaneous TID WC   insulin glargine-yfgn  32 Units Subcutaneous BID   ipratropium-albuterol  3 mL Nebulization Q6H   losartan  50 mg Oral Daily   pantoprazole  40 mg Oral  BID   traZODone  50 mg Oral QHS   Continuous Infusions:  cefTRIAXone (ROCEPHIN)  IV 2 g (12/07/21 2138)     LOS: 1 day   Geradine Girt  [facility]   How to contact the [facility] Attending or Consulting provider [place] or covering provider during after hours [place], for this patient?  Check the care team in [facility] and look for a) attending/consulting [facility] provider listed and b) the [facility] [place] team listed Log into [email or web address] and use [place]'s universal password to access. If you do not have the password, please contact the hospital operator. Locate the [facility] provider you are looking for under [facility] and page to a number that you can be directly reached. If you still have difficulty reaching the provider, please page the [facility] (Director on Call) for the Hospitalists listed on amion for assistance.  12/08/2021, 2:28 PM

## 2021-12-09 ENCOUNTER — Inpatient Hospital Stay (HOSPITAL_COMMUNITY): Admission: RE | Admit: 2021-12-09 | Payer: Medicare Other | Source: Ambulatory Visit

## 2021-12-09 LAB — CULTURE, RESPIRATORY W GRAM STAIN: Culture: NORMAL

## 2021-12-09 LAB — GLUCOSE, CAPILLARY
Glucose-Capillary: 109 mg/dL — ABNORMAL HIGH (ref 70–99)
Glucose-Capillary: 106 mg/dL — ABNORMAL HIGH (ref 70–99)
Glucose-Capillary: 186 mg/dL — ABNORMAL HIGH (ref 70–99)
Glucose-Capillary: 162 mg/dL — ABNORMAL HIGH (ref 70–99)
Glucose-Capillary: 131 mg/dL — ABNORMAL HIGH (ref 70–99)
Glucose-Capillary: 122 mg/dL — ABNORMAL HIGH (ref 70–99)

## 2021-12-09 NOTE — TOC Initial Note (Signed)
Transition of Care [facility]) - Initial/Assessment Note    Patient Details  Name: Maureen Jones MRN: [id number] Date of Birth: 11/28/1966  Transition [facility]) CM/SW Contact:    Cyndi Bender, RN Phone Number: 12/09/2021, 5:00 PM  Clinical Narrative:                  Zack with [employer] and I went to speech to patient regarding trach supplies.  Patient states that she was using more than one inner canula a day.  Zack explained that 1 inner canula and 3 suction catheters are meant to last a day. Patient understands.  We also spoke about the cost of the supplies.  With her [facility] medicare patient is responsible for 20% of the trach supply cost.  Edwyna Ready is going to send a hardship form to the patient's e-mail and put a hold on her account so she won't pay for her supplies until the hardship decision is made.  It usually takes 30 days. RNCM has ordered patient trach supplies to last her 2 weeks and patient is aware that she needs to call [employer] 10 days before she needs more supplies. Salvadore Dom, NP has ordered trach team to assist in re-teaching. TOC will continue to follow for needs.   Expected Discharge Plan: [place] Barriers to Discharge: Continued Medical Work up   Patient Goals and CMS Choice Patient states their goals for this hospitalization and ongoing recovery are:: return home      Expected Discharge Plan and Services Expected Discharge Plan: [place]       Living arrangements for the past 2 months: Mobile Home                                      Prior Living Arrangements/Services Living arrangements for the past 2 months: Mobile Home Lives with:: Friends                   Activities of Daily Living Home Assistive Devices/Equipment: None ADL Screening (condition at time of admission) Patient's cognitive ability adequate to safely complete daily activities?: Yes Is the patient deaf or have difficulty  hearing?: No Does the patient have difficulty seeing, even when wearing glasses/contacts?: No Does the patient have difficulty concentrating, remembering, or making decisions?: No Patient able to express need for assistance with ADLs?: Yes Does the patient have difficulty dressing or bathing?: No Independently performs ADLs?: Yes (appropriate for developmental age) Does the patient have difficulty walking or climbing stairs?: No Weakness of Legs: None Weakness of Arms/Hands: None  Permission Sought/Granted                  Emotional Assessment              Admission diagnosis:  Pneumonia [J18.9] Acute respiratory failure with hypoxia (HCC) [J96.01] Acute respiratory failure with hypoxemia ([place]) [J96.01] Patient Active Problem List   Diagnosis Date Noted   Acute respiratory failure with hypoxia ([place]) 12/07/2021   Acute respiratory failure with hypoxemia ([place]) 12/07/2021   Pneumonia 12/06/2021   CKD (chronic kidney disease) stage 4, GFR 15-29 ml/min ([place]) 11/22/2021   Mood disorder ([place]) 11/22/2021   Tracheostomy tube present ([place])    Subglottic edema    Vocal cord dysfunction    Acute respiratory failure ([place])    Endotracheal tube present    Encephalopathy acute 09/20/2021  Bilateral carpal tunnel syndrome 11/15/2019   Essential hypertension 11/15/2019   Insomnia 11/15/2019   Mild intermittent asthma without complication 44/61/9012   OSA (obstructive sleep apnea) 11/15/2019   Seasonal allergic rhinitis 11/15/2019   Tobacco abuse 11/15/2019   DM (diabetes mellitus) ([place]) 11/15/2019   PCP:  Flossie Buffy, NP Pharmacy:   [id number] [place], [place] - [address] [place] [address] Phone: [phone] Fax: [phone]  [facility] [address] [place] [address] Phone: [phone] Fax: [phone]     Social Determinants of Health (SDOH) Interventions    Readmission Risk  Interventions Readmission Risk Prevention Plan 11/15/2021  Transportation Screening Complete  PCP or Specialist Appt within 3-5 Days Complete  HRI or [place] Complete  Social Work Consult for [place] Planning/Counseling Patient refused  Palliative Care Screening Not Applicable  Medication Review Press photographer) Complete

## 2021-12-09 NOTE — Evaluation (Signed)
Occupational Therapy Evaluation Patient Details Name: Maureen Jones MRN: [id number] DOB: 02/16/1966 Today's Date: 12/09/2021   History of Present Illness 55 y/o female presented to ED on 12/26 for SOB and nausea. Chest x-ray shows infiltrates concerning for aspiration. Found to have blood clot completing occluding airway. Trach changed on 12/28. PMH: HTN, DM, trach.   Clinical Impression   Pt independent at baseline with ADLs and functional mobility, uses 4L O2 trach collar at home. Pt currently supervision-min guard for ADLs during session, mod I for bed mobility and supervision for transfers. Reports being close to baseline with ADLs and functional mobility. Pt currently presenting with impairments listed below, will continue to follow acutely. Recommend HHOT at d/c.      Recommendations for follow up therapy are one component of a multi-disciplinary discharge planning process, led by the attending physician.  Recommendations may be updated based on patient status, additional functional criteria and insurance authorization.   Follow Up Recommendations  Home health OT    Assistance Recommended at Discharge Frequent or constant Supervision/Assistance  Functional Status Assessment  Patient has had a recent decline in their functional status and demonstrates the ability to make significant improvements in function in a reasonable and predictable amount of time.  Equipment Recommendations  None recommended by OT    Recommendations for Other Services PT consult     Precautions / Restrictions Precautions Precautions: Fall;Other (comment) Precaution Comments: trach Restrictions Weight Bearing Restrictions: No      Mobility Bed Mobility Overal bed mobility: Modified Independent             General bed mobility comments: pt able to move supine > sit with minimal use of bedrails    Transfers Overall transfer level: Needs assistance Equipment used: None Transfers: Sit  to/from Stand Sit to Stand: Supervision                  Balance Overall balance assessment: Mild deficits observed, not formally tested                                         ADL either performed or assessed with clinical judgement   ADL   Eating/Feeding: Supervision/ safety   Grooming: Wash/dry hands;Wash/dry face;Oral care;Sitting;Standing;Supervision/safety Grooming Details (indicate cue type and reason): stood at sink to wash face, brushed teeth sitting EOB while waiting for pt to finish breathing treatment Upper Body Bathing: Supervision/ safety;Sitting   Lower Body Bathing: Min guard;Sit to/from stand   Upper Body Dressing : Min guard;Sitting   Lower Body Dressing: Min guard;Sit to/from stand   Toilet Transfer: Supervision/safety;Ambulation;Regular Glass blower/designer Details (indicate cue type and reason): simulated in room to chair Toileting- Clothing Manipulation and Hygiene: Supervision/safety;Sit to/from stand;Sitting/lateral lean       Functional mobility during ADLs: Supervision/safety       Vision   Vision Assessment?: No apparent visual deficits     Perception     Praxis      Pertinent Vitals/Pain Pain Assessment: No/denies pain     Hand Dominance Right   Extremity/Trunk Assessment Upper Extremity Assessment Upper Extremity Assessment: Generalized weakness   Lower Extremity Assessment Lower Extremity Assessment: Defer to PT evaluation   Cervical / Trunk Assessment Cervical / Trunk Assessment: Normal   Communication Communication Communication: Tracheostomy   Cognition Arousal/Alertness: Awake/alert Behavior During Therapy: [facility] for tasks assessed/performed  General Comments  Pt on 6LO2, 35% FiO2 trach collar. VSS throughout session    Exercises     Shoulder Instructions      Home Living Family/patient expects to be discharged to:: Private  residence Living Arrangements: Other relatives Available Help at Discharge: Family;Available 24 hours/day Type of Home: Mobile home Home Access: Stairs to enter Entrance Stairs-Number of Steps: 4 Entrance Stairs-Rails: Left Home Layout: One level     Bathroom Shower/Tub: Teacher, early years/pre: Standard Bathroom Accessibility: Yes   Home Equipment: Conservation officer, nature (2 wheels);BSC/3in1   Additional Comments: was receiving HHPT since last admission  Lives With: Spouse    Prior Functioning/Environment Prior Level of Function : Independent/Modified Independent             Mobility Comments: required 4L O2 trach collar at home. Ambulated with no AD ADLs Comments: sponge bathes at baseline        OT Problem List: Decreased activity tolerance;Impaired balance (sitting and/or standing);Decreased cognition;Decreased safety awareness;Decreased knowledge of use of DME or AE;Decreased knowledge of precautions      OT Treatment/Interventions: Self-care/ADL training;Therapeutic exercise;DME and/or AE instruction;Therapeutic activities;Visual/perceptual remediation/compensation;Balance training;Patient/family education;Cognitive remediation/compensation    OT Goals(Current goals can be found in the care plan section) Acute Rehab OT Goals Patient Stated Goal: none stated OT Goal Formulation: With patient Time For Goal Achievement: 12/23/21 Potential to Achieve Goals: Good  OT Frequency: Min 2X/week   Barriers to D/C:            Co-evaluation              AM-PAC OT "6 Clicks" Daily Activity     Outcome Measure Help from another person eating meals?: None Help from another person taking care of personal grooming?: None Help from another person toileting, which includes using toliet, bedpan, or urinal?: A Little Help from another person bathing (including washing, rinsing, drying)?: A Little Help from another person to put on and taking off regular upper body  clothing?: A Little Help from another person to put on and taking off regular lower body clothing?: A Little 6 Click Score: 20   End of Session Equipment Utilized During Treatment: Gait belt;Oxygen Nurse Communication: Mobility status  Activity Tolerance: Patient tolerated treatment well Patient left: in chair;with call bell/phone within reach;with chair alarm set  OT Visit Diagnosis: Other abnormalities of gait and mobility (R26.89)                Time: 4259-5638 OT Time Calculation (min): 27 min Charges:  OT General Charges $OT Visit: 1 Visit OT Evaluation $OT Eval Low Complexity: 1 Low OT Treatments $Self Care/Home Management : 8-22 mins  Lynnda Child, OTD, OTR/L Acute Rehab [phone] - [place] 12/09/2021, 10:22 AM

## 2021-12-09 NOTE — Progress Notes (Signed)
Progress Note    Maureen Jones  FOY:[id number] DOB: 1966/05/23  DOA: 12/06/2021 PCP: Flossie Buffy, NP    Brief Narrative:     Medical records reviewed and are as summarized below:  Maureen Jones is an 55 y.o. female with history of recent admission for respiratory failure in the setting of hypertensive emergency and hypoxia due to vocal cord dysfunction and supraglottic edema and asthma exacerbation was initially intubated and was on ventilator and subsequently had to have a tracheostomy was discharged on November 15, 2021.  She has been out of her suction supplies for about 1 week.     Assessment/Plan:   Principal Problem:   Acute respiratory failure with hypoxia (HCC) Active Problems:   Tracheostomy tube present (HCC)   Essential hypertension   OSA (obstructive sleep apnea)   DM (diabetes mellitus) (HCC)   CKD (chronic kidney disease) stage 4, GFR 15-29 ml/min (HCC)   Pneumonia   Acute respiratory failure with hypoxemia (HCC)    Acute respiratory failure with hypoxia  - likely secondary to aspiration and clogged tracheostomy tube which has been suction and aspiration has improved.   -Chest x-ray does show infiltrates concerning for pneumonia so will continue abx- change to PO for d/c -nebulizer and frequent suctioning -PCCM consult to exchange trach -x ray 12/28: improving atelectasis -has been weaned to 5L (was on 4-5L at home)  Hypertension  - on amlodipine Coreg and hydralazine   Diabetes mellitus type 2 with hyperglycemia  -on Tresiba 32 units twice daily.  -SSI  Chronic kidney disease stage IV  -creatinine appears to be at baseline. -around 2  Anemia  -appears to be chronic hemoglobin is at baseline. -unclear kind but prob CKD  History of asthma  - continue with nebulizer.  Hypomagnesemia -repleted   Family Communication/Anticipated D/C date and plan/Code Status   DVT prophylaxis: Lovenox ordered. Code Status:  partial Disposition Plan: Status is: Inpatient  Remains inpatient appropriate because: home in 24 to 48 hours         Medical Consultants:   PCCM  Subjective:   Has not seen a care manager yet to discuss her supplies  Objective:    Vitals:   12/09/21 0954 12/09/21 1127 12/09/21 1142 12/09/21 1342  BP: (!) 177/94  134/60   Pulse:  88 86   Resp:  15 19   Temp:   98.6 F (37 C)   TempSrc:   Oral   SpO2:  98% 99% 98%    Intake/Output Summary (Last 24 hours) at 12/09/2021 1415 Last data filed at 12/09/2021 1146 Gross per 24 hour  Intake 370 ml  Output 1000 ml  Net -630 ml   There were no vitals filed for this visit.  Exam:   General: Appearance:    Obese female in no acute distress     Lungs:     Trach in place, respirations unlabored  Heart:    Normal heart rate.    MS:   All extremities are intact.    Neurologic:   Awake, alert       Data Reviewed:   I have personally reviewed following labs and imaging studies:  Labs: Labs show the following:   Basic Metabolic Panel: Recent Labs  Lab 12/06/21 2147 12/07/21 0610 12/08/21 0230  NA 137 138 141  K 5.0 4.9 4.2  CL 106 106 107  CO2 19* 25 26  GLUCOSE 397* 337* 158*  BUN 36* 34* 25*  CREATININE 2.35* 2.40* 2.00*  CALCIUM 9.6 9.4 9.8  MG  --  1.7  --    GFR CrCl cannot be calculated (Unknown ideal weight.). Liver Function Tests: Recent Labs  Lab 12/06/21 2147  AST 14*  ALT 14  ALKPHOS 111  BILITOT 0.6  PROT 6.5  ALBUMIN 3.3*   No results for input(s): LIPASE, AMYLASE in the last 168 hours. No results for input(s): AMMONIA in the last 168 hours. Coagulation profile Recent Labs  Lab 12/06/21 2147  INR 1.0    CBC: Recent Labs  Lab 12/06/21 2147 12/07/21 0610 12/08/21 0230  WBC 14.2* 13.4* 10.1  NEUTROABS 12.1*  --   --   HGB 9.4* 8.9* 9.1*  HCT 30.2* 29.3* 29.9*  MCV 95.6 95.1 93.1  PLT 205 213 221   Cardiac Enzymes: No results for input(s): CKTOTAL, CKMB,  CKMBINDEX, TROPONINI in the last 168 hours. BNP (last 3 results) No results for input(s): PROBNP in the last 8760 hours. CBG: Recent Labs  Lab 12/08/21 1614 12/08/21 2044 12/09/21 0655 12/09/21 0737 12/09/21 1144  GLUCAP 98 124* 109* 106* 186*   D-Dimer: No results for input(s): DDIMER in the last 72 hours. Hgb A1c: No results for input(s): HGBA1C in the last 72 hours. Lipid Profile: No results for input(s): CHOL, HDL, LDLCALC, TRIG, CHOLHDL, LDLDIRECT in the last 72 hours. Thyroid function studies: No results for input(s): TSH, T4TOTAL, T3FREE, THYROIDAB in the last 72 hours.  Invalid input(s): FREET3 Anemia work up: No results for input(s): VITAMINB12, FOLATE, FERRITIN, TIBC, IRON, RETICCTPCT in the last 72 hours. Sepsis Labs: Recent Labs  Lab 12/06/21 2147 12/07/21 0610 12/08/21 0230  WBC 14.2* 13.4* 10.1  LATICACIDVEN 1.2  --   --     Microbiology Recent Results (from the past 240 hour(s))  Resp Panel by RT-PCR (Flu A&B, Covid) Nasopharyngeal Swab     Status: None   Collection Time: 12/06/21  9:23 PM   Specimen: Nasopharyngeal Swab; Nasopharyngeal(NP) swabs in vial transport medium  Result Value Ref Range Status   SARS Coronavirus 2 by RT PCR NEGATIVE NEGATIVE Final    Comment: (NOTE) SARS-CoV-2 target nucleic acids are NOT DETECTED.  The SARS-CoV-2 RNA is generally detectable in upper respiratory specimens during the acute phase of infection. The lowest concentration of SARS-CoV-2 viral copies this assay can detect is 138 copies/mL. A negative result does not preclude SARS-Cov-2 infection and should not be used as the sole basis for treatment or other patient management decisions. A negative result may occur with  improper specimen collection/handling, submission of specimen other than nasopharyngeal swab, presence of viral mutation(s) within the areas targeted by this assay, and inadequate number of viral copies(<138 copies/mL). A negative result must be  combined with clinical observations, patient history, and epidemiological information. The expected result is Negative.  Fact Sheet for Patients:  EntrepreneurPulse.com.au  Fact Sheet for Healthcare Providers:  IncredibleEmployment.be  This test is no t yet approved or cleared by the [place] FDA and  has been authorized for detection and/or diagnosis of SARS-CoV-2 by FDA under an Emergency Use Authorization (EUA). This EUA will remain  in effect (meaning this test can be used) for the duration of the COVID-19 declaration under Section 564(b)(1) of the Act, 21 U.S.C.section 360bbb-3(b)(1), unless the authorization is terminated  or revoked sooner.       Influenza A by PCR NEGATIVE NEGATIVE Final   Influenza B by PCR NEGATIVE NEGATIVE Final    Comment: (NOTE) The Xpert Xpress SARS-CoV-2/FLU/RSV plus assay is intended as  an aid in the diagnosis of influenza from Nasopharyngeal swab specimens and should not be used as a sole basis for treatment. Nasal washings and aspirates are unacceptable for Xpert Xpress SARS-CoV-2/FLU/RSV testing.  Fact Sheet for Patients: EntrepreneurPulse.com.au  Fact Sheet for Healthcare Providers: IncredibleEmployment.be  This test is not yet approved or cleared by the [place] FDA and has been authorized for detection and/or diagnosis of SARS-CoV-2 by FDA under an Emergency Use Authorization (EUA). This EUA will remain in effect (meaning this test can be used) for the duration of the COVID-19 declaration under Section 564(b)(1) of the Act, 21 U.S.C. section 360bbb-3(b)(1), unless the authorization is terminated or revoked.  Performed at [facility], [place] [address] [place], [place] [address]   Blood Culture (routine x 2)     Status: None (Preliminary result)   Collection Time: 12/06/21  9:23 PM   Specimen: BLOOD RIGHT HAND  Result Value Ref Range Status    Specimen Description BLOOD RIGHT HAND  Final   Special Requests   Final    BOTTLES DRAWN AEROBIC AND ANAEROBIC Blood Culture adequate volume   Culture   Final    NO GROWTH 3 DAYS Performed at [facility], [place] [address] [place], [place] [address]    Report Status PENDING  Incomplete  Blood Culture (routine x 2)     Status: None (Preliminary result)   Collection Time: 12/06/21  9:40 PM   Specimen: BLOOD LEFT FOREARM  Result Value Ref Range Status   Specimen Description BLOOD LEFT FOREARM  Final   Special Requests   Final    BOTTLES DRAWN AEROBIC AND ANAEROBIC Blood Culture adequate volume   Culture   Final    NO GROWTH 3 DAYS Performed at [facility], [place] [address] [place], [place] [address]    Report Status PENDING  Incomplete  Culture, Respiratory w Gram Stain     Status: None   Collection Time: 12/06/21 10:30 PM   Specimen: Tracheal Aspirate  Result Value Ref Range Status   Specimen Description TRACHEAL ASPIRATE  Final   Special Requests NONE  Final   Gram Stain   Final    MODERATE WBC PRESENT, PREDOMINANTLY MONONUCLEAR FEW GRAM POSITIVE COCCI IN PAIRS RARE GRAM POSITIVE RODS    Culture   Final    Normal respiratory flora-no Staph aureus or Pseudomonas seen Performed at [facility], [place] [address], [place], [place] [address]    Report Status 12/09/2021 FINAL  Final  Urine Culture     Status: Abnormal   Collection Time: 12/07/21 12:11 PM   Specimen: In/Out Cath Urine  Result Value Ref Range Status   Specimen Description IN/OUT CATH URINE  Final   Special Requests   Final    NONE Performed at [facility], [place] [address], [place], [place] [address]    Culture (A)  Final    10,000 COLONIES/mL MULTIPLE SPECIES PRESENT, SUGGEST RECOLLECTION   Report Status 12/08/2021 FINAL  Final    Procedures and diagnostic studies:  DG CHEST PORT 1 VIEW  Result Date: 12/08/2021 CLINICAL DATA:  Hypoxemia. EXAM: PORTABLE CHEST 1 VIEW COMPARISON:   12/06/2021 FINDINGS: Poor inspiration with mild improvement. Stable mildly enlarged cardiac silhouette. Decreased left basilar atelectasis. Clear right lung. Thoracic spine degenerative changes. IMPRESSION: Improving left basilar atelectasis. Electronically Signed   By: Claudie Revering M.D.   On: 12/08/2021 09:21    Medications:    amLODipine  10 mg  Oral Daily   aspirin EC  81 mg Oral Daily   atorvastatin  20 mg Oral Daily   azithromycin  500 mg Oral q1800   budesonide  0.25 mg Nebulization BID   carvedilol  25 mg Oral BID   clonazepam  0.25 mg Oral QHS   DULoxetine  60 mg Oral Daily   enoxaparin (LOVENOX) injection  40 mg Subcutaneous Q24H   fluticasone  2 spray Each Nare Daily   guaiFENesin  600 mg Oral BID   hydrALAZINE  100 mg Oral TID   insulin aspart  0-15 Units Subcutaneous TID WC   insulin glargine-yfgn  32 Units Subcutaneous BID   ipratropium-albuterol  3 mL Nebulization Q6H   losartan  50 mg Oral Daily   pantoprazole  40 mg Oral BID   traZODone  50 mg Oral QHS   Continuous Infusions:  cefTRIAXone (ROCEPHIN)  IV 2 g (12/08/21 2128)     LOS: 2 days   Geradine Girt  [facility]   How to contact the [facility] Attending or Consulting provider [place] or covering provider during after hours [place], for this patient?  Check the care team in [facility] and look for a) attending/consulting TRH provider listed and b) the [facility] team listed Log into [email or web address] and use [facility] universal password to access. If you do not have the password, please contact the hospital operator. Locate the [facility] provider you are looking for under [facility] and page to a number that you can be directly reached. If you still have difficulty reaching the provider, please page the [facility] (Director on Call) for the Hospitalists listed on amion for assistance.  12/09/2021, 2:15 PM

## 2021-12-09 NOTE — Consult Note (Signed)
NAME:  Maureen Jones, MRN:  [id number], DOB:  May 20, 1966, LOS: 2 ADMISSION DATE:  12/06/2021, CONSULTATION DATE:  12/08/21 REFERRING MD:  Eulogio Bear, DO CHIEF COMPLAINT:  Acute hypoxemic respiratory failure   History of Present Illness:  55 year old female former smoker with asthma s/p tracheostomy 10/05/21 secondary to supraglottic edema and VCD admitted for AHRF secondary to aspiration and clogged tracheostomy that was suctioned. Started on antibiotics and nebulizers for presumed pneumonia. Prior to hospitalization patient reported running out of respiratory supplies x 1 week.  Overnight on 12/28, RT was called to bedside due to dyspnea. Trach suctioned with minimal blood secretions. Inner cannula was changed out without improvement. This AM FIO2 increased to 60% however patient continues to be in respiratory distress. Consulted by primary team for worsening acute hypoxemic respiratory failure  Of note, patient had recent prolonged hospitalization earlier this month (Oct/Nov 2022) for failed extubation due to supraglottic edema requiring tracheostomy. During this admission had PEA arrest (11/2) after dislodged trach. Redo tracheostomy with 6 XLT. Was changed to cuffless 5 proximal XLT on 10/25/21. 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 Cuffless #5 prox XLT in place 12/28 Overnight, worsening respiratory status. PCCM consulted and exchanged trach to cuffed #5 proximal XLT due to clogged trach 12/29 FIO2 weaned to 28% on 5L  Interim History / Subjective:  FIO2 weaned to 28% on 5L Denies shortness of breath, cough or wheezing No hemoptysis  Objective   Blood pressure (!) 177/94, pulse (!) 103, temperature  98.6 F (37 C), temperature source Oral, resp. rate 20, SpO2 96 %.    FiO2 (%):  [28 %-35 %] 35 %   Intake/Output Summary (Last 24 hours) at 12/09/2021 1113 Last data filed at 12/08/2021 2138 Gross per 24 hour  Intake 370 ml  Output 400 ml  Net -30 ml   There were no vitals filed for this visit. Physical Exam: General: Chronically ill-appearing, no acute distress HENT: , AT, OP clear, MMM Neck: Cuffed #5 proximal XLT in place, c/d/i Eyes: EOMI, no scleral icterus Respiratory: Clear to auscultation bilaterally.  No crackles, wheezing or rales Cardiovascular: RRR, -M/R/G, no JVD Extremities: Trace edema,-tenderness Neuro: AAO x4, CNII-XII grossly intact Psych: Normal mood, normal affect  CXR 12/06/21 - Cardiomegaly. Left basilar atelectasis CXR 12/07/21 - Cardiomegaly. Improved atelectasis  Resolved Hospital Problem list     Assessment & Plan:   Acute hypoxemic respiratory failure 2/2 aspiration +/- pneumonia, hemoptysis - resolved Hemoptysis secondary to frequent suctioning - resolved S/p tracheostomy Hx asthma, OSA 12/28 Coordinated care with primary team, RT and RN for patient in respiratory distress. PCO2 colorimetric with no change. Diagnostic trach bronch performed at bedside. Thick blood clot present not amenable to suction. Exchanged trach for cuffed #5 proximal XLT. Immediate improvement in oxygenation s/p exchange. Old blood clots present however no signs of active bleeding.  12/29 Respiratory status improved  --Exchange for cuffless trach for pending discharge in 24-48 hours per primary team --Wean supplemental O2 for goal SpO2 >90% --Scheduled nebs: Pulmicort, Duonebs --Abx per primary team --No indication for steroids. Doubt asthma exacerbation at this time --Routine trach care  Will reschedule trach clinic follow-up  Pulmonary available as needed. If patient is not discharged this  week, please re-contact team to add to the list for weekly  follow-up  Best Practice (right click and "Reselect all SmartList Selections" daily)   Diet/type: Regular consistency (see orders) DVT prophylaxis: not indicated Hold for hemoptysis. Reassess tomorrow GI prophylaxis: PPI Lines: N/A Foley:  N/A Code Status:  limited. No chest compressions or shocks. Intubation and ACLS drugs OK Last date of multidisciplinary goals of care discussion [12/08/21]  Critical care time: 35 min   Rodman Pickle, M.D. [facility] Pulmonary/Critical Care Medicine 12/09/2021 11:13 AM   See Amion for personal pager For hours between 7 PM to 7 AM, please call Elink for urgent questions

## 2021-12-09 NOTE — Procedures (Addendum)
Tracheostomy Change Note  Patient Details:   Name: Maureen Jones DOB: Jul 16, 1966 MRN: [id number]    Airway Documentation:      Evaluation  O2 sats: stable throughout Complications: No apparent complications Patient did tolerate procedure well. Trach changed from #5 XLT cuffed to 5XLT cuffless per MD order by RT and NP. ETCO2 detector positive for color change. Pt tolerated procedure well. Trach secured. Bilateral Breath Sounds: Diminished    Vilinda Blanks 12/09/2021, 11:30 AM

## 2021-12-09 NOTE — Telephone Encounter (Signed)
Spoke with patient care giver, pt is currently in the hospital due to pneumonia and states they will follow up with our office after she is released.

## 2021-12-10 ENCOUNTER — Telehealth: Payer: Self-pay

## 2021-12-10 LAB — BODY FLUID CELL COUNT WITH DIFFERENTIAL
Eos, Fluid: 4 %
Lymphs, Fluid: 4 %
Monocyte-Macrophage-Serous Fluid: 13 % — ABNORMAL LOW (ref 50–90)
Neutrophil Count, Fluid: 79 % — ABNORMAL HIGH (ref 0–25)
Total Nucleated Cell Count, Fluid: 131 cu mm (ref 0–1000)

## 2021-12-10 LAB — CBC
HCT: 29.5 % — ABNORMAL LOW (ref 36.0–46.0)
Hemoglobin: 9.3 g/dL — ABNORMAL LOW (ref 12.0–15.0)
MCH: 29.4 pg (ref 26.0–34.0)
MCHC: 31.5 g/dL (ref 30.0–36.0)
MCV: 93.4 fL (ref 80.0–100.0)
Platelets: 207 10*3/uL (ref 150–400)
RBC: 3.16 MIL/uL — ABNORMAL LOW (ref 3.87–5.11)
RDW: 13.9 % (ref 11.5–15.5)
WBC: 9.9 10*3/uL (ref 4.0–10.5)
nRBC: 0 % (ref 0.0–0.2)

## 2021-12-10 LAB — GLUCOSE, CAPILLARY
Glucose-Capillary: 58 mg/dL — ABNORMAL LOW (ref 70–99)
Glucose-Capillary: 59 mg/dL — ABNORMAL LOW (ref 70–99)
Glucose-Capillary: 76 mg/dL (ref 70–99)
Glucose-Capillary: 122 mg/dL — ABNORMAL HIGH (ref 70–99)
Glucose-Capillary: 106 mg/dL — ABNORMAL HIGH (ref 70–99)
Glucose-Capillary: 64 mg/dL — ABNORMAL LOW (ref 70–99)
Glucose-Capillary: 89 mg/dL (ref 70–99)
Glucose-Capillary: 119 mg/dL — ABNORMAL HIGH (ref 70–99)

## 2021-12-10 LAB — BASIC METABOLIC PANEL
Anion gap: 6 (ref 5–15)
BUN: 23 mg/dL — ABNORMAL HIGH (ref 6–20)
CO2: 25 mmol/L (ref 22–32)
Calcium: 9.5 mg/dL (ref 8.9–10.3)
Chloride: 107 mmol/L (ref 98–111)
Creatinine, Ser: 1.86 mg/dL — ABNORMAL HIGH (ref 0.44–1.00)
GFR, Estimated: 32 mL/min — ABNORMAL LOW (ref >60)
Glucose, Bld: 96 mg/dL (ref 70–99)
Potassium: 3.8 mmol/L (ref 3.5–5.1)
Sodium: 138 mmol/L (ref 135–145)

## 2021-12-10 MED ORDER — GUAIFENESIN ER 600 MG PO TB12
1200.0000 mg | ORAL_TABLET | Freq: Two times a day (BID) | ORAL | Status: DC
  Administered 2021-12-10 – 2021-12-17 (×14): 1200 mg via ORAL
  Filled 2021-12-10 (×14): qty 2

## 2021-12-10 MED ORDER — SENNA 8.6 MG PO TABS
2.0000 | ORAL_TABLET | Freq: Every day | ORAL | Status: DC
  Filled 2021-12-10 (×5): qty 2

## 2021-12-10 MED ORDER — FENTANYL CITRATE PF 50 MCG/ML IJ SOSY
PREFILLED_SYRINGE | INTRAMUSCULAR | Status: AC
  Administered 2021-12-10: 12:00:00 50 ug via INTRAVENOUS
  Filled 2021-12-10: qty 2

## 2021-12-10 MED ORDER — INSULIN ASPART 100 UNIT/ML IJ SOLN
0.0000 [IU] | Freq: Three times a day (TID) | INTRAMUSCULAR | Status: DC
  Administered 2021-12-11: 1 [IU] via SUBCUTANEOUS
  Administered 2021-12-11 – 2021-12-12 (×2): 2 [IU] via SUBCUTANEOUS
  Administered 2021-12-12 – 2021-12-13 (×3): 3 [IU] via SUBCUTANEOUS
  Administered 2021-12-13: 2 [IU] via SUBCUTANEOUS
  Administered 2021-12-13: 3 [IU] via SUBCUTANEOUS
  Administered 2021-12-14: 2 [IU] via SUBCUTANEOUS
  Administered 2021-12-14 (×2): 3 [IU] via SUBCUTANEOUS
  Administered 2021-12-15 – 2021-12-16 (×4): 2 [IU] via SUBCUTANEOUS
  Administered 2021-12-16: 3 [IU] via SUBCUTANEOUS
  Administered 2021-12-16: 2 [IU] via SUBCUTANEOUS
  Administered 2021-12-17: 5 [IU] via SUBCUTANEOUS
  Administered 2021-12-17: 3 [IU] via SUBCUTANEOUS

## 2021-12-10 MED ORDER — MIDAZOLAM HCL 2 MG/2ML IJ SOLN: 2.0000 mg | Freq: Once | INTRAMUSCULAR | Status: AC

## 2021-12-10 MED ORDER — KETAMINE HCL 50 MG/5ML IJ SOSY
PREFILLED_SYRINGE | INTRAMUSCULAR | Status: AC
  Filled 2021-12-10: qty 5

## 2021-12-10 MED ORDER — SUCCINYLCHOLINE CHLORIDE 200 MG/10ML IV SOSY
PREFILLED_SYRINGE | INTRAVENOUS | Status: AC
  Filled 2021-12-10: qty 10

## 2021-12-10 MED ORDER — CHLORHEXIDINE GLUCONATE CLOTH 2 % EX PADS
6.0000 | MEDICATED_PAD | Freq: Every day | CUTANEOUS | Status: DC
  Administered 2021-12-11 – 2021-12-12 (×4): 6 via TOPICAL

## 2021-12-10 MED ORDER — FENTANYL CITRATE PF 50 MCG/ML IJ SOSY: 100.0000 ug | PREFILLED_SYRINGE | Freq: Once | INTRAMUSCULAR | Status: AC

## 2021-12-10 MED ORDER — ROCURONIUM BROMIDE 10 MG/ML (PF) SYRINGE
PREFILLED_SYRINGE | INTRAVENOUS | Status: AC
  Filled 2021-12-10: qty 10

## 2021-12-10 MED ORDER — ETOMIDATE 2 MG/ML IV SOLN: 20.0000 mg | Freq: Once | INTRAVENOUS | Status: AC

## 2021-12-10 MED ORDER — TRANEXAMIC ACID FOR INHALATION
500.0000 mg | Freq: Once | RESPIRATORY_TRACT | Status: DC
  Filled 2021-12-10: qty 10
  Filled 2021-12-10: qty 5

## 2021-12-10 MED ORDER — INSULIN GLARGINE-YFGN 100 UNIT/ML ~~LOC~~ SOLN
26.0000 [IU] | Freq: Two times a day (BID) | SUBCUTANEOUS | Status: DC
  Administered 2021-12-10: 11:00:00 26 [IU] via SUBCUTANEOUS
  Filled 2021-12-10 (×3): qty 0.26

## 2021-12-10 MED ORDER — ETOMIDATE 2 MG/ML IV SOLN
INTRAVENOUS | Status: AC
  Administered 2021-12-10: 12:00:00 20 mg via INTRAVENOUS
  Filled 2021-12-10: qty 20

## 2021-12-10 MED ORDER — MIDAZOLAM HCL 2 MG/2ML IJ SOLN
INTRAMUSCULAR | Status: AC
  Administered 2021-12-10: 12:00:00 2 mg via INTRAVENOUS
  Filled 2021-12-10: qty 2

## 2021-12-10 NOTE — Plan of Care (Signed)
°  Problem: Education: Goal: Knowledge of General Education information will improve Description: Including pain rating scale, medication(s)/side effects and non-pharmacologic comfort measures Outcome: Not Progressing   Problem: Health Behavior/Discharge Planning: Goal: Ability to manage health-related needs will improve Outcome: Not Progressing   Problem: Clinical Measurements: Goal: Ability to maintain clinical measurements within normal limits will improve Outcome: Not Progressing Goal: Will remain free from infection Outcome: Not Progressing Goal: Diagnostic test results will improve Outcome: Not Progressing Goal: Respiratory complications will improve Outcome: Not Progressing Goal: Cardiovascular complication will be avoided Outcome: Not Progressing   Problem: Elimination: Goal: Will not experience complications related to bowel motility Outcome: Not Progressing Goal: Will not experience complications related to urinary retention Outcome: Not Progressing   Problem: Pain Managment: Goal: General experience of comfort will improve Outcome: Not Progressing   Problem: Safety: Goal: Ability to remain free from injury will improve Outcome: Not Progressing   Problem: Skin Integrity: Goal: Risk for impaired skin integrity will decrease Outcome: Not Progressing

## 2021-12-10 NOTE — Care Management Important Message (Signed)
Important Message  Patient Details  Name: Maureen Jones MRN: [id number] Date of Birth: 11/14/1966   Medicare Important Message Given:  Yes     Hannah Beat 12/10/2021, 1:41 PM

## 2021-12-10 NOTE — Progress Notes (Addendum)
Progress Note    Avelynn Sellin  MPN:[id number] DOB: January 27, 1966  DOA: 12/06/2021 PCP: Flossie Buffy, NP    Brief Narrative:     Medical records reviewed and are as summarized below:  Kieanna Rollo is an 55 y.o. female with history of recent admission for respiratory failure in the setting of hypertensive emergency and hypoxia due to vocal cord dysfunction and supraglottic edema and asthma exacerbation was initially intubated and was on ventilator and subsequently had to have a tracheostomy was discharged on November 15, 2021.  She has been out of her suction supplies for about 1 week.  Continues to have issues with plugging.  Patient to go to the ICU for trach care under PCCM but I will continue to follow/keep on my list.   Assessment/Plan:   Principal Problem:   Acute respiratory failure with hypoxia (HCC) Active Problems:   Tracheostomy tube present (HCC)   Essential hypertension   OSA (obstructive sleep apnea)   DM (diabetes mellitus) (HCC)   CKD (chronic kidney disease) stage 4, GFR 15-29 ml/min (HCC)   Pneumonia   Acute respiratory failure with hypoxemia (HCC)    Acute respiratory failure with hypoxia  - likely secondary to aspiration and clogged tracheostomy tube which has been suction and aspiration has improved.   -Chest x-ray does show infiltrates concerning for pneumonia so will continue abx- change to PO when ready for d/c -nebulizer and frequent suctioning -mucinex -PCCM consult to exchange trach and continued mucous plugging -x ray 12/28: improving atelectasis -has been weaned to 5L (was on 4-5L at home)  Hypertension  - on amlodipine Coreg and hydralazine   Diabetes mellitus type 2 with hyperglycemia  -wean down tresiba and blood sugar was low this AM -SSI  Chronic kidney disease stage IV  -creatinine appears to be at baseline. -around 2  Anemia  -appears to be chronic hemoglobin is at baseline. -unclear kind but prob CKD  History of  asthma  - continue with nebulizer.  Hypomagnesemia -repleted   Family Communication/Anticipated D/C date and plan/Code Status   DVT prophylaxis: Lovenox ordered. Code Status: partial Disposition Plan: Status is: Inpatient  Remains inpatient appropriate because: home in 24 to 48 hours once no longer having issues with mucous plugging         Medical Consultants:   PCCM  Subjective:   Having issues with trach again this AM-- working with RT  Objective:    Vitals:   12/10/21 0415 12/10/21 0739 12/10/21 0812 12/10/21 0943  BP: 134/80 (!) 150/80 (!) 149/73   Pulse: 84 76    Resp: 16 18    Temp:  98.5 F (36.9 C)    TempSrc:  Oral    SpO2: 97% 95%  96%    Intake/Output Summary (Last 24 hours) at 12/10/2021 1024 Last data filed at 12/09/2021 2207 Gross per 24 hour  Intake 200 ml  Output 600 ml  Net -400 ml   There were no vitals filed for this visit.  Exam:    General: Appearance:    Obese female in no acute distress     Lungs:      respirations unlabored  Heart:    Normal heart rate.    MS:   All extremities are intact.    Neurologic:   Awake, alert, communicates verbally and with writing         Data Reviewed:   I have personally reviewed following labs and imaging studies:  Labs: Labs show the following:  Basic Metabolic Panel: Recent Labs  Lab 12/06/21 2147 12/07/21 0610 12/08/21 0230 12/10/21 0230  NA 137 138 141 138  K 5.0 4.9 4.2 3.8  CL 106 106 107 107  CO2 19* 25 26 25   GLUCOSE 397* 337* 158* 96  BUN 36* 34* 25* 23*  CREATININE 2.35* 2.40* 2.00* 1.86*  CALCIUM 9.6 9.4 9.8 9.5  MG  --  1.7  --   --    GFR CrCl cannot be calculated (Unknown ideal weight.). Liver Function Tests: Recent Labs  Lab 12/06/21 2147  AST 14*  ALT 14  ALKPHOS 111  BILITOT 0.6  PROT 6.5  ALBUMIN 3.3*   No results for input(s): LIPASE, AMYLASE in the last 168 hours. No results for input(s): AMMONIA in the last 168 hours. Coagulation  profile Recent Labs  Lab 12/06/21 2147  INR 1.0    CBC: Recent Labs  Lab 12/06/21 2147 12/07/21 0610 12/08/21 0230 12/10/21 0230  WBC 14.2* 13.4* 10.1 9.9  NEUTROABS 12.1*  --   --   --   HGB 9.4* 8.9* 9.1* 9.3*  HCT 30.2* 29.3* 29.9* 29.5*  MCV 95.6 95.1 93.1 93.4  PLT 205 213 221 207   Cardiac Enzymes: No results for input(s): CKTOTAL, CKMB, CKMBINDEX, TROPONINI in the last 168 hours. BNP (last 3 results) No results for input(s): PROBNP in the last 8760 hours. CBG: Recent Labs  Lab 12/09/21 2015 12/10/21 0739 12/10/21 0753 12/10/21 0801 12/10/21 0946  GLUCAP 122* 58* 59* 76 122*   D-Dimer: No results for input(s): DDIMER in the last 72 hours. Hgb A1c: No results for input(s): HGBA1C in the last 72 hours. Lipid Profile: No results for input(s): CHOL, HDL, LDLCALC, TRIG, CHOLHDL, LDLDIRECT in the last 72 hours. Thyroid function studies: No results for input(s): TSH, T4TOTAL, T3FREE, THYROIDAB in the last 72 hours.  Invalid input(s): FREET3 Anemia work up: No results for input(s): VITAMINB12, FOLATE, FERRITIN, TIBC, IRON, RETICCTPCT in the last 72 hours. Sepsis Labs: Recent Labs  Lab 12/06/21 2147 12/07/21 0610 12/08/21 0230 12/10/21 0230  WBC 14.2* 13.4* 10.1 9.9  LATICACIDVEN 1.2  --   --   --     Microbiology Recent Results (from the past 240 hour(s))  Resp Panel by RT-PCR (Flu A&B, Covid) Nasopharyngeal Swab     Status: None   Collection Time: 12/06/21  9:23 PM   Specimen: Nasopharyngeal Swab; Nasopharyngeal(NP) swabs in vial transport medium  Result Value Ref Range Status   SARS Coronavirus 2 by RT PCR NEGATIVE NEGATIVE Final    Comment: (NOTE) SARS-CoV-2 target nucleic acids are NOT DETECTED.  The SARS-CoV-2 RNA is generally detectable in upper respiratory specimens during the acute phase of infection. The lowest concentration of SARS-CoV-2 viral copies this assay can detect is 138 copies/mL. A negative result does not preclude  SARS-Cov-2 infection and should not be used as the sole basis for treatment or other patient management decisions. A negative result may occur with  improper specimen collection/handling, submission of specimen other than nasopharyngeal swab, presence of viral mutation(s) within the areas targeted by this assay, and inadequate number of viral copies(<138 copies/mL). A negative result must be combined with clinical observations, patient history, and epidemiological information. The expected result is Negative.  Fact Sheet for Patients:  EntrepreneurPulse.com.au  Fact Sheet for Healthcare Providers:  IncredibleEmployment.be  This test is no t yet approved or cleared by the [place] FDA and  has been authorized for detection and/or diagnosis of SARS-CoV-2 by FDA under an  Emergency Use Authorization (EUA). This EUA will remain  in effect (meaning this test can be used) for the duration of the COVID-19 declaration under Section 564(b)(1) of the Act, 21 U.S.C.section 360bbb-3(b)(1), unless the authorization is terminated  or revoked sooner.       Influenza A by PCR NEGATIVE NEGATIVE Final   Influenza B by PCR NEGATIVE NEGATIVE Final    Comment: (NOTE) The Xpert Xpress SARS-CoV-2/FLU/RSV plus assay is intended as an aid in the diagnosis of influenza from Nasopharyngeal swab specimens and should not be used as a sole basis for treatment. Nasal washings and aspirates are unacceptable for Xpert Xpress SARS-CoV-2/FLU/RSV testing.  Fact Sheet for Patients: EntrepreneurPulse.com.au  Fact Sheet for Healthcare Providers: IncredibleEmployment.be  This test is not yet approved or cleared by the [place] FDA and has been authorized for detection and/or diagnosis of SARS-CoV-2 by FDA under an Emergency Use Authorization (EUA). This EUA will remain in effect (meaning this test can be used) for the duration of  the COVID-19 declaration under Section 564(b)(1) of the Act, 21 U.S.C. section 360bbb-3(b)(1), unless the authorization is terminated or revoked.  Performed at [facility], [place] [address] [place], [place] [address]   Blood Culture (routine x 2)     Status: None (Preliminary result)   Collection Time: 12/06/21  9:23 PM   Specimen: BLOOD RIGHT HAND  Result Value Ref Range Status   Specimen Description BLOOD RIGHT HAND  Final   Special Requests   Final    BOTTLES DRAWN AEROBIC AND ANAEROBIC Blood Culture adequate volume   Culture   Final    NO GROWTH 4 DAYS Performed at [facility], [place] [address], [place], [place] [address]    Report Status PENDING  Incomplete  Blood Culture (routine x 2)     Status: None (Preliminary result)   Collection Time: 12/06/21  9:40 PM   Specimen: BLOOD LEFT FOREARM  Result Value Ref Range Status   Specimen Description BLOOD LEFT FOREARM  Final   Special Requests   Final    BOTTLES DRAWN AEROBIC AND ANAEROBIC Blood Culture adequate volume   Culture   Final    NO GROWTH 4 DAYS Performed at [facility], [place] [address] [place], [place] [address]    Report Status PENDING  Incomplete  Culture, Respiratory w Gram Stain     Status: None   Collection Time: 12/06/21 10:30 PM   Specimen: Tracheal Aspirate  Result Value Ref Range Status   Specimen Description TRACHEAL ASPIRATE  Final   Special Requests NONE  Final   Gram Stain   Final    MODERATE WBC PRESENT, PREDOMINANTLY MONONUCLEAR FEW GRAM POSITIVE COCCI IN PAIRS RARE GRAM POSITIVE RODS    Culture   Final    Normal respiratory flora-no Staph aureus or Pseudomonas seen Performed at [facility], [place] [address] [place], [place] [address]    Report Status 12/09/2021 FINAL  Final  Urine Culture     Status: Abnormal   Collection Time: 12/07/21 12:11 PM   Specimen: In/Out Cath Urine  Result Value Ref Range Status   Specimen Description IN/OUT CATH URINE  Final   Special  Requests   Final    NONE Performed at [facility], [place] [address] [place], [place] [address]    Culture (A)  Final    10,000 COLONIES/mL MULTIPLE SPECIES PRESENT, SUGGEST RECOLLECTION   Report Status 12/08/2021 FINAL  Final  Procedures and diagnostic studies:  No results found.  Medications:    amLODipine  10 mg Oral Daily   aspirin EC  81 mg Oral Daily   atorvastatin  20 mg Oral Daily   budesonide  0.25 mg Nebulization BID   carvedilol  25 mg Oral BID   clonazepam  0.25 mg Oral QHS   DULoxetine  60 mg Oral Daily   enoxaparin (LOVENOX) injection  40 mg Subcutaneous Q24H   fluticasone  2 spray Each Nare Daily   guaiFENesin  1,200 mg Oral BID   hydrALAZINE  100 mg Oral TID   insulin aspart  0-15 Units Subcutaneous TID WC   insulin glargine-yfgn  26 Units Subcutaneous BID   ipratropium-albuterol  3 mL Nebulization Q6H   losartan  50 mg Oral Daily   pantoprazole  40 mg Oral BID   traZODone  50 mg Oral QHS   Continuous Infusions:  cefTRIAXone (ROCEPHIN)  IV Stopped (12/09/21 2200)     LOS: 3 days   Geradine Girt  [facility]   How to contact the [facility] Attending or Consulting provider [place] or covering provider during after hours [place], for this patient?  Check the care team in [facility] and look for a) attending/consulting [facility] provider listed and b) the [facility] team listed Log into [email or web address] and use [place]'s universal password to access. If you do not have the password, please contact the hospital operator. Locate the [facility] provider you are looking for under [facility] and page to a number that you can be directly reached. If you still have difficulty reaching the provider, please page the [facility] (Director on Call) for the Hospitalists listed on amion for assistance.  12/10/2021, 10:24 AM

## 2021-12-10 NOTE — Progress Notes (Signed)
Sent secure chat and let Salvadore Dom, NP know that patient's CBG is 64 and patient is agreeable to eat dinner. NP gave order for  patient to go on trach collar for dinner and then to go back on ventilator over night.

## 2021-12-10 NOTE — Progress Notes (Signed)
NAME:  Maureen Jones, MRN:  [id number], DOB:  06-Jan-1966, LOS: 3 ADMISSION DATE:  12/06/2021, CONSULTATION DATE:  12/08/21 REFERRING MD:  Eulogio Bear, DO CHIEF COMPLAINT:  Acute hypoxemic respiratory failure   History of Present Illness:  55 year old female former smoker with asthma s/p tracheostomy 10/05/21 secondary to supraglottic edema and VCD admitted for AHRF secondary to aspiration and clogged tracheostomy that was suctioned. Started on antibiotics and nebulizers for presumed pneumonia. Prior to hospitalization patient reported running out of respiratory supplies x 1 week.  Overnight on 12/28, RT was called to bedside due to dyspnea. Trach suctioned with minimal blood secretions. Inner cannula was changed out without improvement. This AM FIO2 increased to 60% however patient continues to be in respiratory distress. Consulted by primary team for worsening acute hypoxemic respiratory failure  Of note, patient had recent prolonged hospitalization earlier this month (Oct/Nov 2022) for failed extubation due to supraglottic edema requiring tracheostomy. During this admission had PEA arrest (11/2) after dislodged trach. Redo tracheostomy with 6 XLT. Was changed to cuffless 5 proximal XLT on 10/25/21. Pertinent  Medical History  S/p trach secondary to supraglottic edema and VCD, asthma, HTN, DM2, CKD stage IV, OSA, chronic anemia  Significant Hospital Events: Including procedures, antibiotic start and stop dates in addition to other pertinent events   12/27 Admitted by [facility] for increased O2 with FIO2 40%, abx and nebs. Cuffless #5 prox XLT in place 12/28 Overnight, worsening respiratory status. PCCM consulted and exchanged trach to cuffed #5 proximal XLT due to clogged trach 12/29 FIO2 weaned to 28% on 5L, changed trach to cuffless 12/30 trach occluded in am. Moved to ICU for trach upsize. Got tranexamic acid neb x 1   Interim History / Subjective:  Feels Ok currently   Objective   Blood  pressure (Abnormal) 149/73, pulse 76, temperature 98.5 F (36.9 C), temperature source Oral, resp. rate 18, SpO2 96 %.    FiO2 (%):  [28 %] 28 %   Intake/Output Summary (Last 24 hours) at 12/10/2021 1108 Last data filed at 12/09/2021 2207 Gross per 24 hour  Intake 200 ml  Output 600 ml  Net -400 ml   There were no vitals filed for this visit. Physical Exam: General this is a 55 year old female, sitting up in bed. She is in no distress HENT currently has 5 cuffless trach. Able to phonate w/out difficulty Pulm some scattered rhonchi. 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 I think initially not having humidification at home was a contributing factor but also size 5 XLT may be a little more difficult to clear secretions/hence need for extra suctioning.   Plan Upsize trach to 6 prox XLT I have already ordered trach humidification for home Therapeutic bronch after trach upsize Day 5 rocephin  F/u cxr Trach care consult for trach teaching  Cont BDs and ICS  HTN Plan Cont amlodipine, Coreg and hydralazine  CKD stage IV Plan Trend Renal dose meds  Type 2 diabetes Plan Continuing sliding scale insulin, currently on Semglee 26 units twice daily as well, we may need to readjust this Blood glucose goal 140-180  Anemia 2/2  CKD Plan Trend  Transfusion trigger < 7  Best Practice (right click and "Reselect all SmartList Selections" daily)   Diet/type: Regular consistency (see orders) DVT prophylaxis: not indicated Hold for hemoptysis. Reassess tomorrow GI prophylaxis: PPI Lines: N/A Foley:  N/A Code Status:  limited. No  chest compressions or shocks. Intubation and ACLS drugs OK Last date of multidisciplinary goals of care discussion [12/08/21]  Critical care time: NA   Erick Colace ACNP-BC [place] Pager # [phone] OR # [phone] if no answer

## 2021-12-10 NOTE — Progress Notes (Signed)
PCCM Interval Note  Called to bedside by RT for respiratory distress secondary to clogged trach. RT able to suction some bloody clots however trach remains partially occluded. Will transfer to ICU with plan to exchange to cuffed trach and bronchoscopy to evaluate for ongoing bleed.  Transfer order placed to the ICU. Rapid response and RT contacted.   Rodman Pickle, M.D. [facility] Pulmonary/Critical Care Medicine 12/10/2021 10:39 AM   See Amion for personal pager For hours between 7 PM to 7 AM, please call Elink for urgent questions

## 2021-12-10 NOTE — Progress Notes (Signed)
Pt off of vent and on ATC to eat per Marni Griffon NP.

## 2021-12-10 NOTE — Procedures (Signed)
Bedside Bronchoscopy Procedure Note Maureen Jones [id number] 17-Aug-1966  Procedure: Bronchoscopy Indications: Diagnostic evaluation of the airways  Procedure Details: ET Tube Size: ET Tube secured at lip (cm): Bite block in place: No In preparation for procedure, Patient hyper-oxygenated with 100 % FiO2 Airway entered and the following bronchi were examined: RUL, RML, and RLL.   Bronchoscope removed.  , Patient placed back on 100% FiO2 at conclusion of procedure.    Evaluation BP (!) 149/73    Pulse 76    Temp 98.5 F (36.9 C) (Oral)    Resp 18    SpO2 96%  Breath Sounds:Clear and Diminished O2 sats: stable throughout Patient's Current Condition: stable Specimens:  Sent serosanguinous fluid Complications: No apparent complications Patient did tolerate procedure well.   Osamah Schmader 12/10/2021, 12:29 PM

## 2021-12-10 NOTE — Procedures (Addendum)
Percutaneous Tracheostomy Revision Procedure Note   Ernesto Lashway  [id number]  06-27-1966  Date:12/10/21  Time:12:23 PM   Provider Performing:Pete E Kary Kos  Procedure: Percutaneous Tracheostomy with Bronchoscopic Guidance (29290)  Indication(s) Recurrent mucous plugging   Consent Risks of the procedure as well as the alternatives and risks of each were explained to the patient and/or caregiver.  Consent for the procedure was obtained.  Anesthesia Etomidate, Versed, Fentanyl, Vecuronium   Time Out Verified patient identification, verified procedure, site/side was marked, verified correct patient position, special equipment/implants available, medications/allergies/relevant history reviewed, required imaging and test results available.   Sterile Technique Maximal sterile technique including sterile barrier drape, hand hygiene, sterile gown, sterile gloves, mask, hair covering.  Procedure Description Patient's neck prepped and draped in sterile fashion.   using usual Seldinger technique and serial dilation a new size 6 proximal LXT was placed & Bronchoscope confirmed placement above the carina.   Patient connected to ventilator.   Complications/Tolerance None; patient tolerated the procedure well. Chest X-ray is ordered to confirm no post-procedural complication.   EBL Minimal    Dynamic airway collapse noted   Specimen(s) None   Erick Colace ACNP-BC [place] Pager # [phone] OR # [phone] if no answer

## 2021-12-10 NOTE — Procedures (Signed)
Bronchoscopy Procedure Note  Maureen Jones  [id number]  12/10/1966  Date:12/10/21  Time:12:22 PM   Provider Performing:Layana Konkel C Tamala Julian   Procedure(s):  Flexible bronchoscopy with bronchial alveolar lavage [phone])  Indication(s) Check trach placement, r/o infectious pneumonia  Consent Risks of the procedure as well as the alternatives and risks of each were explained to the patient and/or caregiver.  Consent for the procedure was obtained and is signed in the bedside chart  Anesthesia In place for tracheostomy revision   Time Out Verified patient identification, verified procedure, site/side was marked, verified correct patient position, special equipment/implants available, medications/allergies/relevant history reviewed, required imaging and test results available.   Sterile Technique Usual hand hygiene, masks, gowns, and gloves were used   Procedure Description Bronchoscope advanced through tracheostomy tube and into airway.  Airways were examined down to subsegmental level with findings noted below.   Following diagnostic evaluation, BAL(s) performed in LLL with normal saline and return of bloody/cloudy fluid  Findings:  Dynamic airway collapse Trach in good position Fair amount of secretions in lower airways  Complications/Tolerance None; patient tolerated the procedure well. Chest X-ray is not needed post procedure.   EBL Minimal   Specimen(s) BAL LLL

## 2021-12-10 NOTE — Progress Notes (Addendum)
OT Cancellation Note  Patient Details Name: Maureen Jones MRN: [id number] DOB: 04/26/1966   Cancelled Treatment:    Reason Eval/Treat Not Completed: Medical issues which prohibited therapy (MD asking to hold off for today due to pt desatting earlier due to clogged trach)  Lynnda Child, OTD, OTR/L Acute Rehab [phone]   Kaylyn Lim 12/10/2021, 10:29 AM

## 2021-12-10 NOTE — Progress Notes (Signed)
PT Cancellation Note  Patient Details Name: Maureen Jones MRN: [id number] DOB: 1966-04-26   Cancelled Treatment:    Reason Eval/Treat Not Completed: Patient not medically ready (MD leaving room when PT arrives.  States to hold tx, pt. has clogged trach.  PT to f/u per POC.)  Declin Rajan A. Joniya Boberg, PT, DPT Acute Rehabilitation Services Office: [place] 12/10/2021, 10:30 AM

## 2021-12-10 NOTE — Telephone Encounter (Signed)
Maureen Jones called from [facility] to inform our office that patient was in hospital due to issues with trach care and not having the supplies she needed. She wanted our office to have her information in cause there is any issues with the patient going forward. She can be reached by phone at [phone], and states if there are orders needed or any questions regarding things the patient may need we can call her and she can help in anyway possible. I did inform her that the patient is new to our office, being seen for the first time on 11/22/21 and at the time did not express any need for supplies for her trach. She voiced understanding and thinks the lack of trach care could be due to trach care being new to the patient and insurance issues.

## 2021-12-10 NOTE — Progress Notes (Signed)
Patient ate some of her dinner tray and drank juice. CBG rechecked and is 89. No s/s of hypoglycemia were noted. Dr. Tamala Julian stopped by room and checked on patient while she was off the vent and stated she could stay off the vent overnight if she wants to.

## 2021-12-10 NOTE — Progress Notes (Addendum)
Nutrition Brief Note  Patient identified due to being on the ventilator. Discussed pt with RN who reports pt had trach revision and bronch today and will remain on the vent overnight. RN states plan is for pt to then transfer back to the floor in the morning.   Admitting Dx: Pneumonia [J18.9] Acute respiratory failure with hypoxia (HCC) [J96.01] Acute respiratory failure with hypoxemia (HCC) [J96.01] PMH:  Past Medical History:  Diagnosis Date   Asthma    Diabetes ([place])    Glottic and subglottic edema 2022   Hypertension    Medications:  Scheduled Meds:  amLODipine  10 mg Oral Daily   aspirin EC  81 mg Oral Daily   atorvastatin  20 mg Oral Daily   budesonide  0.25 mg Nebulization BID   carvedilol  25 mg Oral BID   Chlorhexidine Gluconate Cloth  6 each Topical Daily   clonazepam  0.25 mg Oral QHS   DULoxetine  60 mg Oral Daily   enoxaparin (LOVENOX) injection  40 mg Subcutaneous Q24H   fluticasone  2 spray Each Nare Daily   guaiFENesin  1,200 mg Oral BID   hydrALAZINE  100 mg Oral TID   insulin aspart  0-9 Units Subcutaneous TID WC   ipratropium-albuterol  3 mL Nebulization Q6H   ketamine HCl       losartan  50 mg Oral Daily   pantoprazole  40 mg Oral BID   rocuronium bromide       senna  2 tablet Oral Daily   succinylcholine       tranexamic acid  500 mg Nebulization Once   traZODone  50 mg Oral QHS  Continuous Infusions:  cefTRIAXone (ROCEPHIN)  IV Stopped (12/09/21 2200)    Labs: Recent Labs  Lab 12/07/21 0610 12/08/21 0230 12/10/21 0230  NA 138 141 138  K 4.9 4.2 3.8  CL 106 107 107  CO2 25 26 25   BUN 34* 25* 23*  CREATININE 2.40* 2.00* 1.86*  CALCIUM 9.4 9.8 9.5  MG 1.7  --   --   GLUCOSE 337* 158* 96    Wt Readings from Last 15 Encounters:  11/22/21 105.1 kg  11/15/21 104 kg  Pt well known to Clinical Nutrition team from previous admission. Pt was discharged home with trach with 100% meal completion. At this time, RN reports pt is too lethargic to  eat; however, pt was previously on carb modified diet and was consuming meals without issue.   No nutrition interventions warranted at this time. If nutrition issues arise, please consult RD. Note that if pt does not get have improved mentation with return of PO intake within 24 hours as discussed with RN, RD will follow-up for nutrition plan of care.    Theone Stanley., MS, RD, LDN (she/her/hers) RD pager number and weekend/on-call pager number located in [place].

## 2021-12-11 LAB — CBC
HCT: 31 % — ABNORMAL LOW (ref 36.0–46.0)
Hemoglobin: 9.4 g/dL — ABNORMAL LOW (ref 12.0–15.0)
MCH: 28.5 pg (ref 26.0–34.0)
MCHC: 30.3 g/dL (ref 30.0–36.0)
MCV: 93.9 fL (ref 80.0–100.0)
Platelets: 216 10*3/uL (ref 150–400)
RBC: 3.3 MIL/uL — ABNORMAL LOW (ref 3.87–5.11)
RDW: 14.1 % (ref 11.5–15.5)
WBC: 10.2 10*3/uL (ref 4.0–10.5)
nRBC: 0 % (ref 0.0–0.2)

## 2021-12-11 LAB — BASIC METABOLIC PANEL
Anion gap: 9 (ref 5–15)
BUN: 21 mg/dL — ABNORMAL HIGH (ref 6–20)
CO2: 24 mmol/L (ref 22–32)
Calcium: 9.5 mg/dL (ref 8.9–10.3)
Chloride: 106 mmol/L (ref 98–111)
Creatinine, Ser: 1.82 mg/dL — ABNORMAL HIGH (ref 0.44–1.00)
GFR, Estimated: 33 mL/min — ABNORMAL LOW (ref >60)
Glucose, Bld: 163 mg/dL — ABNORMAL HIGH (ref 70–99)
Potassium: 4.1 mmol/L (ref 3.5–5.1)
Sodium: 139 mmol/L (ref 135–145)

## 2021-12-11 LAB — CULTURE, BLOOD (ROUTINE X 2)
Culture: NO GROWTH
Culture: NO GROWTH
Special Requests: ADEQUATE
Special Requests: ADEQUATE

## 2021-12-11 LAB — GLUCOSE, CAPILLARY
Glucose-Capillary: 95 mg/dL (ref 70–99)
Glucose-Capillary: 135 mg/dL — ABNORMAL HIGH (ref 70–99)
Glucose-Capillary: 189 mg/dL — ABNORMAL HIGH (ref 70–99)
Glucose-Capillary: 250 mg/dL — ABNORMAL HIGH (ref 70–99)

## 2021-12-11 MED ORDER — GABAPENTIN 100 MG PO CAPS
100.0000 mg | ORAL_CAPSULE | Freq: Two times a day (BID) | ORAL | Status: DC
  Administered 2021-12-11 – 2021-12-17 (×12): 100 mg via ORAL
  Filled 2021-12-11 (×12): qty 1

## 2021-12-11 MED ORDER — LORATADINE 10 MG PO TABS
10.0000 mg | ORAL_TABLET | Freq: Every day | ORAL | Status: DC
  Administered 2021-12-11 – 2021-12-17 (×7): 10 mg via ORAL
  Filled 2021-12-11 (×7): qty 1

## 2021-12-11 NOTE — Progress Notes (Signed)
Progress Note    Maureen Jones  JQZ:[id number] DOB: 03-20-1966  DOA: 12/06/2021 PCP: Flossie Buffy, NP    Brief Narrative:     Medical records reviewed and are as summarized below:  Maureen Jones is an 55 y.o. female with history of recent admission for respiratory failure in the setting of hypertensive emergency and hypoxia due to vocal cord dysfunction and supraglottic edema and asthma exacerbation was initially intubated and was on ventilator and subsequently had to have a tracheostomy was discharged on November 15, 2021.  She has been out of her suction supplies for about 1 week.  Continues to have issues with plugging.  Patient to go to the ICU for trach care with PCCM but I will continue to follow/keep on my list as the attending.     Assessment/Plan:   Principal Problem:   Acute respiratory failure with hypoxia (HCC) Active Problems:   Tracheostomy tube present (HCC)   Essential hypertension   OSA (obstructive sleep apnea)   DM (diabetes mellitus) (HCC)   CKD (chronic kidney disease) stage 4, GFR 15-29 ml/min (HCC)   Pneumonia   Acute respiratory failure with hypoxemia (HCC)    Acute respiratory failure with hypoxia with severe tracheobronchomalacia - likely secondary to aspiration and clogged tracheostomy tube which has been suction and aspiration has improved.   -Chest x-ray does show infiltrates concerning for pneumonia so was treated with antibiotics x 5 days -nebulizer and frequent suctioning -mucinex -PCCM consult appreciated: Needs home trach humidification prior to DC -x ray 12/28: improving atelectasis -has been weaned to 5L (was on 4-5L at home)  Hypertension  - on amlodipine, Coreg, and hydralazine   Diabetes mellitus type 2 with hyperglycemia  -tresiba held -SSI  Chronic kidney disease stage IV  -creatinine appears to be at baseline. -around 2  Anemia  -appears to be chronic hemoglobin is at baseline. -unclear kind but prob  CKD  History of asthma  - continue with nebulizer.  Hypomagnesemia -repleted   Family Communication/Anticipated D/C date and plan/Code Status   DVT prophylaxis: Lovenox ordered. Code Status: partial Disposition Plan: Status is: Inpatient  Remains inpatient appropriate because: here until Tuesday due to complications with her trach         Medical Consultants:   PCCM  Subjective:   In chair--- needing suctioned, increased resp. distress while being suctioned  Objective:    Vitals:   12/11/21 0600 12/11/21 0640 12/11/21 0749 12/11/21 0755  BP: (!) 165/78  (!) 159/72   Pulse: 85  88   Resp: 19  20   Temp:  98.8 F (37.1 C)    TempSrc:  Oral    SpO2: 99%   99%    Intake/Output Summary (Last 24 hours) at 12/11/2021 0830 Last data filed at 12/11/2021 0600 Gross per 24 hour  Intake 640 ml  Output 1400 ml  Net -760 ml   There were no vitals filed for this visit.  Exam:    General: Appearance:    Obese female in mod resp distress needing suctioned     Lungs:     Diffuse wheezing while coughing, resolved once suctioned complete  Heart:    Normal heart rate.   MS:   All extremities are intact.    Neurologic:   Awake, alert, oriented x 3. No apparent focal neurological           defect.       Data Reviewed:   I have personally reviewed following labs  and imaging studies:  Labs: Labs show the following:   Basic Metabolic Panel: Recent Labs  Lab 12/06/21 2147 12/07/21 0610 12/08/21 0230 12/10/21 0230  NA 137 138 141 138  K 5.0 4.9 4.2 3.8  CL 106 106 107 107  CO2 19* _0 GLUCOSE 397* 337* 158* 96  BUN 36* 34* 25* 23*  CREATININE 2.35* 2.40* 2.00* 1.86*  CALCIUM 9.6 9.4 9.8 9.5  MG  --  1.7  --   --    GFR CrCl cannot be calculated (Unknown ideal weight.). Liver Function Tests: Recent Labs  Lab 12/06/21 2147  AST 14*  ALT 14  ALKPHOS 111  BILITOT 0.6  PROT 6.5  ALBUMIN 3.3*   No results for input(s): LIPASE, AMYLASE in the  last 168 hours. No results for input(s): AMMONIA in the last 168 hours. Coagulation profile Recent Labs  Lab 12/06/21 2147  INR 1.0    CBC: Recent Labs  Lab 12/06/21 2147 12/07/21 0610 12/08/21 0230 12/10/21 0230  WBC 14.2* 13.4* 10.1 9.9  NEUTROABS 12.1*  --   --   --   HGB 9.4* 8.9* 9.1* 9.3*  HCT 30.2* 29.3* 29.9* 29.5*  MCV 95.6 95.1 93.1 93.4  PLT 205 213 221 207   Cardiac Enzymes: No results for input(s): CKTOTAL, CKMB, CKMBINDEX, TROPONINI in the last 168 hours. BNP (last 3 results) No results for input(s): PROBNP in the last 8760 hours. CBG: Recent Labs  Lab 12/10/21 1130 12/10/21 1639 12/10/21 1744 12/10/21 2235 12/11/21 0640  GLUCAP 106* 64* 89 119* 95   D-Dimer: No results for input(s): DDIMER in the last 72 hours. Hgb A1c: No results for input(s): HGBA1C in the last 72 hours. Lipid Profile: No results for input(s): CHOL, HDL, LDLCALC, TRIG, CHOLHDL, LDLDIRECT in the last 72 hours. Thyroid function studies: No results for input(s): TSH, T4TOTAL, T3FREE, THYROIDAB in the last 72 hours.  Invalid input(s): FREET3 Anemia work up: No results for input(s): VITAMINB12, FOLATE, FERRITIN, TIBC, IRON, RETICCTPCT in the last 72 hours. Sepsis Labs: Recent Labs  Lab 12/06/21 2147 12/07/21 0610 12/08/21 0230 12/10/21 0230  WBC 14.2* 13.4* 10.1 9.9  LATICACIDVEN 1.2  --   --   --     Microbiology Recent Results (from the past 240 hour(s))  Resp Panel by RT-PCR (Flu A&B, Covid) Nasopharyngeal Swab     Status: None   Collection Time: 12/06/21  9:23 PM   Specimen: Nasopharyngeal Swab; Nasopharyngeal(NP) swabs in vial transport medium  Result Value Ref Range Status   SARS Coronavirus 2 by RT PCR NEGATIVE NEGATIVE Final    Comment: (NOTE) SARS-CoV-2 target nucleic acids are NOT DETECTED.  The SARS-CoV-2 RNA is generally detectable in upper respiratory specimens during the acute phase of infection. The lowest concentration of SARS-CoV-2 viral copies this  assay can detect is 138 copies/mL. A negative result does not preclude SARS-Cov-2 infection and should not be used as the sole basis for treatment or other patient management decisions. A negative result may occur with  improper specimen collection/handling, submission of specimen other than nasopharyngeal swab, presence of viral mutation(s) within the areas targeted by this assay, and inadequate number of viral copies(<138 copies/mL). A negative result must be combined with clinical observations, patient history, and epidemiological information. The expected result is Negative.  Fact Sheet for Patients:  EntrepreneurPulse.com.au  Fact Sheet for Healthcare Providers:  IncredibleEmployment.be  This test is no t yet approved or cleared by the [place] FDA and  has been  authorized for detection and/or diagnosis of SARS-CoV-2 by FDA under an Emergency Use Authorization (EUA). This EUA will remain  in effect (meaning this test can be used) for the duration of the COVID-19 declaration under Section 564(b)(1) of the Act, 21 U.S.C.section 360bbb-3(b)(1), unless the authorization is terminated  or revoked sooner.       Influenza A by PCR NEGATIVE NEGATIVE Final   Influenza B by PCR NEGATIVE NEGATIVE Final    Comment: (NOTE) The Xpert Xpress SARS-CoV-2/FLU/RSV plus assay is intended as an aid in the diagnosis of influenza from Nasopharyngeal swab specimens and should not be used as a sole basis for treatment. Nasal washings and aspirates are unacceptable for Xpert Xpress SARS-CoV-2/FLU/RSV testing.  Fact Sheet for Patients: EntrepreneurPulse.com.au  Fact Sheet for Healthcare Providers: IncredibleEmployment.be  This test is not yet approved or cleared by the [place] FDA and has been authorized for detection and/or diagnosis of SARS-CoV-2 by FDA under an Emergency Use Authorization (EUA). This EUA will  remain in effect (meaning this test can be used) for the duration of the COVID-19 declaration under Section 564(b)(1) of the Act, 21 U.S.C. section 360bbb-3(b)(1), unless the authorization is terminated or revoked.  Performed at [facility], [place] [address] [place], [place] [address]   Blood Culture (routine x 2)     Status: None (Preliminary result)   Collection Time: 12/06/21  9:23 PM   Specimen: BLOOD RIGHT HAND  Result Value Ref Range Status   Specimen Description BLOOD RIGHT HAND  Final   Special Requests   Final    BOTTLES DRAWN AEROBIC AND ANAEROBIC Blood Culture adequate volume   Culture   Final    NO GROWTH 4 DAYS Performed at [facility], [place] [address], [place], [place] [address]    Report Status PENDING  Incomplete  Blood Culture (routine x 2)     Status: None (Preliminary result)   Collection Time: 12/06/21  9:40 PM   Specimen: BLOOD LEFT FOREARM  Result Value Ref Range Status   Specimen Description BLOOD LEFT FOREARM  Final   Special Requests   Final    BOTTLES DRAWN AEROBIC AND ANAEROBIC Blood Culture adequate volume   Culture   Final    NO GROWTH 4 DAYS Performed at [facility], [place] [address] [place], [place] [address]    Report Status PENDING  Incomplete  Culture, Respiratory w Gram Stain     Status: None   Collection Time: 12/06/21 10:30 PM   Specimen: Tracheal Aspirate  Result Value Ref Range Status   Specimen Description TRACHEAL ASPIRATE  Final   Special Requests NONE  Final   Gram Stain   Final    MODERATE WBC PRESENT, PREDOMINANTLY MONONUCLEAR FEW GRAM POSITIVE COCCI IN PAIRS RARE GRAM POSITIVE RODS    Culture   Final    Normal respiratory flora-no Staph aureus or Pseudomonas seen Performed at [facility], [place] [address], [place], [place] [address]    Report Status 12/09/2021 FINAL  Final  Urine Culture     Status: Abnormal   Collection Time: 12/07/21 12:11 PM   Specimen: In/Out Cath Urine  Result Value Ref Range  Status   Specimen Description IN/OUT CATH URINE  Final   Special Requests   Final    NONE Performed at [facility], [place] [address], [place], [place] [address]    Culture (A)  Final    10,000 COLONIES/mL MULTIPLE SPECIES PRESENT, SUGGEST RECOLLECTION  Report Status 12/08/2021 FINAL  Final  Culture, Respiratory w Gram Stain     Status: None (Preliminary result)   Collection Time: 12/10/21  1:49 PM   Specimen: Bronchoalveolar Lavage; Respiratory  Result Value Ref Range Status   Specimen Description BRONCHIAL ALVEOLAR LAVAGE  Final   Special Requests LLL  Final   Gram Stain   Final    RARE WBC PRESENT,BOTH PMN AND MONONUCLEAR NO ORGANISMS SEEN Performed at [facility], [address] [place], [place] [address]    Culture PENDING  Incomplete   Report Status PENDING  Incomplete    Procedures and diagnostic studies:  No results found.  Medications:    amLODipine  10 mg Oral Daily   aspirin EC  81 mg Oral Daily   atorvastatin  20 mg Oral Daily   budesonide  0.25 mg Nebulization BID   carvedilol  25 mg Oral BID   Chlorhexidine Gluconate Cloth  6 each Topical Daily   clonazepam  0.25 mg Oral QHS   DULoxetine  60 mg Oral Daily   enoxaparin (LOVENOX) injection  40 mg Subcutaneous Q24H   fluticasone  2 spray Each Nare Daily   gabapentin  100 mg Oral BID   guaiFENesin  1,200 mg Oral BID   hydrALAZINE  100 mg Oral TID   insulin aspart  0-9 Units Subcutaneous TID WC   ipratropium-albuterol  3 mL Nebulization Q6H   losartan  50 mg Oral Daily   pantoprazole  40 mg Oral BID   senna  2 tablet Oral Daily   tranexamic acid  500 mg Nebulization Once   traZODone  50 mg Oral QHS   Continuous Infusions:     LOS: 4 days   Geradine Girt  [facility]   How to contact the [facility] Attending or Consulting provider [place] or covering provider during after hours [place], for this patient?  Check the care team in [facility] and look for a) attending/consulting [facility] provider  listed and b) the [facility] team listed Log into [email or web address] and use [place]'s universal password to access. If you do not have the password, please contact the hospital operator. Locate the [facility] provider you are looking for under [facility] and page to a number that you can be directly reached. If you still have difficulty reaching the provider, please page the [facility] (Director on Call) for the Hospitalists listed on amion for assistance.  12/11/2021, 8:30 AM

## 2021-12-11 NOTE — Plan of Care (Signed)

## 2021-12-11 NOTE — Progress Notes (Signed)
Physical Therapy Treatment Patient Details Name: Maureen Jones MRN: [id number] DOB: 1966-04-09 Today's Date: 12/11/2021   History of Present Illness 55 y/o female presented to ED on 12/26 for SOB and nausea. Chest x-ray shows infiltrates concerning for aspiration. Found to have blood clot completing occluding airway. Trach changed on 12/28. Pt again with occlusion of trach on 12/30 and moved to ICU and trach upsized. PMH: HTN, DM, trach.    PT Comments    Pt continues to do well with mobility. Should be able to return home when medically ready.    Recommendations for follow up therapy are one component of a multi-disciplinary discharge planning process, led by the attending physician.  Recommendations may be updated based on patient status, additional functional criteria and insurance authorization.  Follow Up Recommendations  No PT follow up     Assistance Recommended at Discharge Intermittent Supervision/Assistance  Equipment Recommendations  None recommended by PT    Recommendations for Other Services       Precautions / Restrictions Precautions Precautions: Other (comment) Precaution Comments: trach     Mobility  Bed Mobility Overal bed mobility: Modified Independent       Supine to sit: Modified independent (Device/Increase time)          Transfers Overall transfer level: Needs assistance Equipment used: None Transfers: Sit to/from Stand Sit to Stand: Supervision           General transfer comment: supervision for safety and lines    Ambulation/Gait Ambulation/Gait assistance: Supervision;Min guard Gait Distance (Feet): 300 Feet Assistive device: None;Rollator (4 wheels) Gait Pattern/deviations: Step-through pattern Gait velocity: decr Gait velocity interpretation: 1.31 - 2.62 ft/sec, indicative of limited community ambulator   General Gait Details: Min guard without assistive device due to pt not as confident. Used rollator in hallway and  supervision only for safety and lines.   Stairs             Wheelchair Mobility    Modified Rankin (Stroke Patients Only)       Balance Overall balance assessment: Mild deficits observed, not formally tested                                          Cognition Arousal/Alertness: Awake/alert Behavior During Therapy: WFL for tasks assessed/performed Overall Cognitive Status: Within Functional Limits for tasks assessed                                          Exercises      General Comments General comments (skin integrity, edema, etc.): VSS with pt on 35% trach collar      Pertinent Vitals/Pain Pain Assessment: No/denies pain    Home Living                          Prior Function            PT Goals (current goals can now be found in the care plan section) Progress towards PT goals: Progressing toward goals    Frequency    Min 3X/week      PT Plan Discharge plan needs to be updated    Co-evaluation              AM-PAC PT "6 Clicks" Mobility  Outcome Measure  Help needed turning from your back to your side while in a flat bed without using bedrails?: None Help needed moving from lying on your back to sitting on the side of a flat bed without using bedrails?: None Help needed moving to and from a bed to a chair (including a wheelchair)?: A Little Help needed standing up from a chair using your arms (e.g., wheelchair or bedside chair)?: None Help needed to walk in hospital room?: A Little Help needed climbing 3-5 steps with a railing? : A Little 6 Click Score: 21    End of Session Equipment Utilized During Treatment: Oxygen;Gait belt Activity Tolerance: Patient tolerated treatment well Patient left: in chair;with call bell/phone within reach Nurse Communication: Mobility status PT Visit Diagnosis: Other abnormalities of gait and mobility (R26.89);Difficulty in walking, not elsewhere classified  (R26.2);Muscle weakness (generalized) (M62.81)     Time: 1100-1117 PT Time Calculation (min) (ACUTE ONLY): 17 min  Charges:  $Gait Training: 8-22 mins                     [place] Pager [phone] Office [place] 12/11/2021, 12:18 PM

## 2021-12-11 NOTE — Progress Notes (Signed)
° °  NAME:  Berneda Piccininni, MRN:  [id number], DOB:  03-12-1966, LOS: 4 ADMISSION DATE:  12/06/2021, CONSULTATION DATE:  12/08/21 REFERRING MD:  Eulogio Bear, DO CHIEF COMPLAINT:  Acute hypoxemic respiratory failure   History of Present Illness:  55 year old female former smoker with asthma s/p tracheostomy 10/05/21 secondary to supraglottic edema and VCD admitted for AHRF secondary to aspiration and clogged tracheostomy that was suctioned. Started on antibiotics and nebulizers for presumed pneumonia. Prior to hospitalization patient reported running out of respiratory supplies x 1 week.  Overnight on 12/28, RT was called to bedside due to dyspnea. Trach suctioned with minimal blood secretions. Inner cannula was changed out without improvement. This AM FIO2 increased to 60% however patient continues to be in respiratory distress. Consulted by primary team for worsening acute hypoxemic respiratory failure  Of note, patient had recent prolonged hospitalization earlier this month (Oct/Nov 2022) for failed extubation due to supraglottic edema requiring tracheostomy. During this admission had PEA arrest (11/2) after dislodged trach. Redo tracheostomy with 6 XLT. Was changed to cuffless 5 proximal XLT on 10/25/21. Pertinent  Medical History  S/p trach secondary to supraglottic edema and VCD, asthma, HTN, DM2, CKD stage IV, OSA, chronic anemia  Significant Hospital Events: Including procedures, antibiotic start and stop dates in addition to other pertinent events   12/27 Admitted by [facility] for increased O2 with FIO2 40%, abx and nebs. Cuffless #5 prox XLT in place 12/28 Overnight, worsening respiratory status. PCCM consulted and exchanged trach to cuffed #5 proximal XLT due to clogged trach 12/29 FIO2 weaned to 28% on 5L, changed trach to cuffless 12/30 trach occluded in am. Moved to ICU for trach upsize. Got tranexamic acid neb x 1  Interim History / Subjective:  Did well overnight, off vent. Able to eat,  some hypoglycemia.  Objective   Blood pressure (!) 159/72, pulse 88, temperature 98.8 F (37.1 C), temperature source Oral, resp. rate 20, SpO2 99 %.    Vent Mode: Stand-by FiO2 (%):  [28 %-100 %] 28 % Set Rate:  [16 bmp] 16 bmp Vt Set:  [470 mL] 470 mL PEEP:  [5 cmH20] 5 cmH20 Plateau Pressure:  [20 cmH20] 20 cmH20   Intake/Output Summary (Last 24 hours) at 12/11/2021 0800 Last data filed at 12/11/2021 0600 Gross per 24 hour  Intake 640 ml  Output 1400 ml  Net -760 ml    There were no vitals filed for this visit. 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  Other issue is underlying severe tracheobronchomalacia.  Seems to do better with larger lumen trach. Hx asthma, OSA Upsized to 6 prox XLT 12/30 - Needs home trach humidification prior to DC - Continue TC - Continue nebs as ordered - Finish out CAP course, f/u BAL data from 12/30 - Given history of cardiac arrest from tracheal plugging, I would keep until Tuesday so we can assure no recurrence and have good outpatient plan - Will follow  HTN CKD stage IV Type 2 diabetes Anemia 2/2 CKD Per primary  Erskine Emery MD PCCM

## 2021-12-12 LAB — CULTURE, RESPIRATORY W GRAM STAIN: Culture: NO GROWTH

## 2021-12-12 LAB — CBC
HCT: 32 % — ABNORMAL LOW (ref 36.0–46.0)
Hemoglobin: 10 g/dL — ABNORMAL LOW (ref 12.0–15.0)
MCH: 29.2 pg (ref 26.0–34.0)
MCHC: 31.3 g/dL (ref 30.0–36.0)
MCV: 93.3 fL (ref 80.0–100.0)
Platelets: 222 10*3/uL (ref 150–400)
RBC: 3.43 MIL/uL — ABNORMAL LOW (ref 3.87–5.11)
RDW: 13.9 % (ref 11.5–15.5)
WBC: 8.6 10*3/uL (ref 4.0–10.5)
nRBC: 0 % (ref 0.0–0.2)

## 2021-12-12 LAB — GLUCOSE, CAPILLARY
Glucose-Capillary: 155 mg/dL — ABNORMAL HIGH (ref 70–99)
Glucose-Capillary: 203 mg/dL — ABNORMAL HIGH (ref 70–99)
Glucose-Capillary: 214 mg/dL — ABNORMAL HIGH (ref 70–99)
Glucose-Capillary: 236 mg/dL — ABNORMAL HIGH (ref 70–99)

## 2021-12-12 LAB — BASIC METABOLIC PANEL
Anion gap: 7 (ref 5–15)
BUN: 25 mg/dL — ABNORMAL HIGH (ref 6–20)
CO2: 24 mmol/L (ref 22–32)
Calcium: 9.8 mg/dL (ref 8.9–10.3)
Chloride: 108 mmol/L (ref 98–111)
Creatinine, Ser: 1.88 mg/dL — ABNORMAL HIGH (ref 0.44–1.00)
GFR, Estimated: 31 mL/min — ABNORMAL LOW (ref >60)
Glucose, Bld: 225 mg/dL — ABNORMAL HIGH (ref 70–99)
Potassium: 4.7 mmol/L (ref 3.5–5.1)
Sodium: 139 mmol/L (ref 135–145)

## 2021-12-12 MED ORDER — INSULIN GLARGINE-YFGN 100 UNIT/ML ~~LOC~~ SOLN
10.0000 [IU] | Freq: Every day | SUBCUTANEOUS | Status: DC
  Administered 2021-12-12 – 2021-12-13 (×2): 10 [IU] via SUBCUTANEOUS
  Filled 2021-12-12 (×2): qty 0.1

## 2021-12-12 NOTE — Progress Notes (Signed)
Progress Note    Maureen Jones  BSJ:[id number] DOB: 20-Aug-1966  DOA: 12/06/2021 PCP: Flossie Buffy, NP    Brief Narrative:     Medical records reviewed and are as summarized below:  Maureen Jones is an 56 y.o. female with history of recent admission for respiratory failure in the setting of hypertensive emergency and hypoxia due to vocal cord dysfunction and supraglottic edema and asthma exacerbation was initially intubated and was on ventilator and subsequently had to have a tracheostomy was discharged on November 15, 2021.  She has been out of her suction supplies for about 1 week.  Continues to have issues with plugging.  S/p trach change.  Watching in hospital until Tuesday.   Assessment/Plan:   Principal Problem:   Acute respiratory failure with hypoxia (HCC) Active Problems:   Tracheostomy tube present (HCC)   Essential hypertension   OSA (obstructive sleep apnea)   DM (diabetes mellitus) (HCC)   CKD (chronic kidney disease) stage 4, GFR 15-29 ml/min (HCC)   Pneumonia   Acute respiratory failure with hypoxemia (HCC)    Acute respiratory failure with hypoxia with severe tracheobronchomalacia - likely secondary to aspiration and clogged tracheostomy tube which has been suction and aspiration has improved.   -Chest x-ray did show infiltrates concerning for pneumonia so was treated with antibiotics x 5 days -nebulizer and frequent suctioning -mucinex -PCCM consult appreciated: Needs home trach humidification prior to DC -x ray 12/28: improving atelectasis -has been weaned to 5L (was on 4-5L at home)  Hypertension  - on amlodipine, Coreg, and hydralazine   Diabetes mellitus type 2 with hyperglycemia  -insulin resumed at lower dose -SSI  Chronic kidney disease stage IV  -creatinine appears to be at baseline. -around 2  Anemia  -appears to be chronic hemoglobin is at baseline. -unclear kind but prob CKD  History of asthma  - continue with  nebulizer. -add claritin  Hypomagnesemia -repleted   Family Communication/Anticipated D/C date and plan/Code Status   DVT prophylaxis: Lovenox ordered. Code Status: partial Disposition Plan: Status is: Inpatient  Remains inpatient appropriate because: here until Tuesday due to complications with her trach         Medical Consultants:   PCCM  Subjective:   No overnight issues  Objective:    Vitals:   12/12/21 0430 12/12/21 0700 12/12/21 0746 12/12/21 0800  BP: 128/72   (!) 155/69  Pulse: 80 83 88 84  Resp: 16 (!) _0 Temp:  98.6 F (37 C)    TempSrc:  Oral    SpO2: 95% 96% 96% 98%    Intake/Output Summary (Last 24 hours) at 12/12/2021 0917 Last data filed at 12/12/2021 0800 Gross per 24 hour  Intake 840 ml  Output 1170 ml  Net -330 ml   There were no vitals filed for this visit.  Exam:    General: Appearance:    Obese female in no acute distress     Lungs:      respirations unlabored, trach in place  Heart:    Normal heart rate.    MS:   All extremities are intact.    Neurologic:   Awake, alert, oriented x 3        Data Reviewed:   I have personally reviewed following labs and imaging studies:  Labs: Labs show the following:   Basic Metabolic Panel: Recent Labs  Lab 12/07/21 0610 12/08/21 0230 12/10/21 0230 12/11/21 1310 12/12/21 0040  NA 138 141 138 139 139  K 4.9 4.2 3.8 4.1 4.7  CL 106 107 107 106 108  CO2 _0 GLUCOSE 337* 158* 96 163* 225*  BUN 34* 25* 23* 21* 25*  CREATININE 2.40* 2.00* 1.86* 1.82* 1.88*  CALCIUM 9.4 9.8 9.5 9.5 9.8  MG 1.7  --   --   --   --    GFR CrCl cannot be calculated (Unknown ideal weight.). Liver Function Tests: Recent Labs  Lab 12/06/21 2147  AST 14*  ALT 14  ALKPHOS 111  BILITOT 0.6  PROT 6.5  ALBUMIN 3.3*   No results for input(s): LIPASE, AMYLASE in the last 168 hours. No results for input(s): AMMONIA in the last 168 hours. Coagulation profile Recent Labs  Lab  12/06/21 2147  INR 1.0    CBC: Recent Labs  Lab 12/06/21 2147 12/07/21 0610 12/08/21 0230 12/10/21 0230 12/11/21 1310 12/12/21 0040  WBC 14.2* 13.4* 10.1 9.9 10.2 8.6  NEUTROABS 12.1*  --   --   --   --   --   HGB 9.4* 8.9* 9.1* 9.3* 9.4* 10.0*  HCT 30.2* 29.3* 29.9* 29.5* 31.0* 32.0*  MCV 95.6 95.1 93.1 93.4 93.9 93.3  PLT 205 213 221 207 216 222   Cardiac Enzymes: No results for input(s): CKTOTAL, CKMB, CKMBINDEX, TROPONINI in the last 168 hours. BNP (last 3 results) No results for input(s): PROBNP in the last 8760 hours. CBG: Recent Labs  Lab 12/11/21 0640 12/11/21 1132 12/11/21 1558 12/11/21 2121 12/12/21 0659  GLUCAP 95 135* 189* 250* 155*   D-Dimer: No results for input(s): DDIMER in the last 72 hours. Hgb A1c: No results for input(s): HGBA1C in the last 72 hours. Lipid Profile: No results for input(s): CHOL, HDL, LDLCALC, TRIG, CHOLHDL, LDLDIRECT in the last 72 hours. Thyroid function studies: No results for input(s): TSH, T4TOTAL, T3FREE, THYROIDAB in the last 72 hours.  Invalid input(s): FREET3 Anemia work up: No results for input(s): VITAMINB12, FOLATE, FERRITIN, TIBC, IRON, RETICCTPCT in the last 72 hours. Sepsis Labs: Recent Labs  Lab 12/06/21 2147 12/07/21 0610 12/08/21 0230 12/10/21 0230 12/11/21 1310 12/12/21 0040  WBC 14.2*   < > 10.1 9.9 10.2 8.6  LATICACIDVEN 1.2  --   --   --   --   --    < > = values in this interval not displayed.    Microbiology Recent Results (from the past 240 hour(s))  Resp Panel by RT-PCR (Flu A&B, Covid) Nasopharyngeal Swab     Status: None   Collection Time: 12/06/21  9:23 PM   Specimen: Nasopharyngeal Swab; Nasopharyngeal(NP) swabs in vial transport medium  Result Value Ref Range Status   SARS Coronavirus 2 by RT PCR NEGATIVE NEGATIVE Final    Comment: (NOTE) SARS-CoV-2 target nucleic acids are NOT DETECTED.  The SARS-CoV-2 RNA is generally detectable in upper respiratory specimens during the acute  phase of infection. The lowest concentration of SARS-CoV-2 viral copies this assay can detect is 138 copies/mL. A negative result does not preclude SARS-Cov-2 infection and should not be used as the sole basis for treatment or other patient management decisions. A negative result may occur with  improper specimen collection/handling, submission of specimen other than nasopharyngeal swab, presence of viral mutation(s) within the areas targeted by this assay, and inadequate number of viral copies(<138 copies/mL). A negative result must be combined with clinical observations, patient history, and epidemiological information. The expected result is Negative.  Fact Sheet for Patients:  EntrepreneurPulse.com.au  Fact Sheet for  Healthcare Providers:  IncredibleEmployment.be  This test is no t yet approved or cleared by the [place] and  has been authorized for detection and/or diagnosis of SARS-CoV-2 by FDA under an Emergency Use Authorization (EUA). This EUA will remain  in effect (meaning this test can be used) for the duration of the COVID-19 declaration under Section 564(b)(1) of the Act, 21 U.S.C.section 360bbb-3(b)(1), unless the authorization is terminated  or revoked sooner.       Influenza A by PCR NEGATIVE NEGATIVE Final   Influenza B by PCR NEGATIVE NEGATIVE Final    Comment: (NOTE) The Xpert Xpress SARS-CoV-2/FLU/RSV plus assay is intended as an aid in the diagnosis of influenza from Nasopharyngeal swab specimens and should not be used as a sole basis for treatment. Nasal washings and aspirates are unacceptable for Xpert Xpress SARS-CoV-2/FLU/RSV testing.  Fact Sheet for Patients: EntrepreneurPulse.com.au  Fact Sheet for Healthcare Providers: IncredibleEmployment.be  This test is not yet approved or cleared by the [place] FDA and has been authorized for detection and/or diagnosis of  SARS-CoV-2 by FDA under an Emergency Use Authorization (EUA). This EUA will remain in effect (meaning this test can be used) for the duration of the COVID-19 declaration under Section 564(b)(1) of the Act, 21 U.S.C. section 360bbb-3(b)(1), unless the authorization is terminated or revoked.  Performed at [facility], [place] [address], [place], [place] [address]   Blood Culture (routine x 2)     Status: None   Collection Time: 12/06/21  9:23 PM   Specimen: BLOOD RIGHT HAND  Result Value Ref Range Status   Specimen Description BLOOD RIGHT HAND  Final   Special Requests   Final    BOTTLES DRAWN AEROBIC AND ANAEROBIC Blood Culture adequate volume   Culture   Final    NO GROWTH 5 DAYS Performed at [facility], [place] [address], [place], [place] [address]    Report Status 12/11/2021 FINAL  Final  Blood Culture (routine x 2)     Status: None   Collection Time: 12/06/21  9:40 PM   Specimen: BLOOD LEFT FOREARM  Result Value Ref Range Status   Specimen Description BLOOD LEFT FOREARM  Final   Special Requests   Final    BOTTLES DRAWN AEROBIC AND ANAEROBIC Blood Culture adequate volume   Culture   Final    NO GROWTH 5 DAYS Performed at [facility], [place] [address], [place], [place] [address]    Report Status 12/11/2021 FINAL  Final  Culture, Respiratory w Gram Stain     Status: None   Collection Time: 12/06/21 10:30 PM   Specimen: Tracheal Aspirate  Result Value Ref Range Status   Specimen Description TRACHEAL ASPIRATE  Final   Special Requests NONE  Final   Gram Stain   Final    MODERATE WBC PRESENT, PREDOMINANTLY MONONUCLEAR FEW GRAM POSITIVE COCCI IN PAIRS RARE GRAM POSITIVE RODS    Culture   Final    Normal respiratory flora-no Staph aureus or Pseudomonas seen Performed at [facility], [place] [address] [place],  [address]    Report Status 12/09/2021 FINAL  Final  Urine Culture     Status: Abnormal   Collection Time: 12/07/21 12:11 PM   Specimen:  In/Out Cath Urine  Result Value Ref Range Status   Specimen Description IN/OUT CATH URINE  Final   Special Requests   Final    NONE Performed at [facility], [place]  [address] [place], [place] [address]    Culture (A)  Final    10,000 COLONIES/mL MULTIPLE SPECIES PRESENT, SUGGEST RECOLLECTION   Report Status 12/08/2021 FINAL  Final  Culture, Respiratory w Gram Stain     Status: None (Preliminary result)   Collection Time: 12/10/21  1:49 PM   Specimen: Bronchoalveolar Lavage; Respiratory  Result Value Ref Range Status   Specimen Description BRONCHIAL ALVEOLAR LAVAGE  Final   Special Requests LLL  Final   Gram Stain   Final    RARE WBC PRESENT,BOTH PMN AND MONONUCLEAR NO ORGANISMS SEEN    Culture   Final    NO GROWTH < 24 HOURS Performed at [facility], [place] [address] [place], [place] [address]    Report Status PENDING  Incomplete    Procedures and diagnostic studies:  No results found.  Medications:    amLODipine  10 mg Oral Daily   aspirin EC  81 mg Oral Daily   atorvastatin  20 mg Oral Daily   budesonide  0.25 mg Nebulization BID   carvedilol  25 mg Oral BID   Chlorhexidine Gluconate Cloth  6 each Topical Daily   clonazepam  0.25 mg Oral QHS   DULoxetine  60 mg Oral Daily   enoxaparin (LOVENOX) injection  40 mg Subcutaneous Q24H   fluticasone  2 spray Each Nare Daily   gabapentin  100 mg Oral BID   guaiFENesin  1,200 mg Oral BID   hydrALAZINE  100 mg Oral TID   insulin aspart  0-9 Units Subcutaneous TID WC   loratadine  10 mg Oral Daily   losartan  50 mg Oral Daily   pantoprazole  40 mg Oral BID   senna  2 tablet Oral Daily   tranexamic acid  500 mg Nebulization Once   traZODone  50 mg Oral QHS   Continuous Infusions:     LOS: 5 days   Geradine Girt  [facility]   How to contact the [facility] Attending or Consulting provider [place] or covering provider during after hours [place], for this patient?  Check the care team in [facility] and look  for a) attending/consulting TR[facility] provider listed and b) the [facility] team listed Log into [email or web address] and use [place]'s universal password to access. If you do not have the password, please contact the hospital operator. Locate the [facility] provider you are looking for under [facility] and page to a number that you can be directly reached. If you still have difficulty reaching the provider, please page the [facility] (Director on Call) for the Hospitalists listed on amion for assistance.  12/12/2021, 9:17 AM

## 2021-12-12 NOTE — Plan of Care (Signed)

## 2021-12-12 NOTE — Progress Notes (Signed)
° °  NAME:  Maureen Jones, MRN:  [id number], DOB:  [birth year], LOS: 5 ADMISSION DATE:  12/06/2021, CONSULTATION DATE:  12/08/21 REFERRING MD:  Eulogio Bear, DO CHIEF COMPLAINT:  Acute hypoxemic respiratory failure   History of Present Illness:  56 year old female former smoker with asthma s/p tracheostomy 10/05/21 secondary to supraglottic edema and VCD admitted for AHRF secondary to aspiration and clogged tracheostomy that was suctioned. Started on antibiotics and nebulizers for presumed pneumonia. Prior to hospitalization patient reported running out of respiratory supplies x 1 week.  Overnight on 12/28, RT was called to bedside due to dyspnea. Trach suctioned with minimal blood secretions. Inner cannula was changed out without improvement. This AM FIO2 increased to 60% however patient continues to be in respiratory distress. Consulted by primary team for worsening acute hypoxemic respiratory failure  Of note, patient had recent prolonged hospitalization earlier this month (Oct/Nov 2022) for failed extubation due to supraglottic edema requiring tracheostomy. During this admission had PEA arrest (11/2) after dislodged trach. Redo tracheostomy with 6 XLT. Was changed to cuffless 5 proximal XLT on 10/25/21. Pertinent  Medical History  S/p trach secondary to supraglottic edema and VCD, asthma, HTN, DM2, CKD stage IV, OSA, chronic anemia  Significant Hospital Events: Including procedures, antibiotic start and stop dates in addition to other pertinent events   12/27 Admitted by [facility] for increased O2 with FIO2 40%, abx and nebs. Cuffless #5 prox XLT in place 12/28 Overnight, worsening respiratory status. PCCM consulted and exchanged trach to cuffed #5 proximal XLT due to clogged trach 12/29 FIO2 weaned to 28% on 5L, changed trach to cuffless 12/30 trach occluded in am. Moved to ICU for trach upsize. Got tranexamic acid neb x 1  Interim History / Subjective:  Doing very well. Had some episodes of  desaturating yesterday that improved spontaneously suspect related to the tracheomalacia.  Objective   Blood pressure 128/72, pulse 88, temperature 98.6 F (37 C), temperature source Oral, resp. rate 18, SpO2 96 %.    FiO2 (%):  [28 %] 28 %   Intake/Output Summary (Last 24 hours) at 12/12/2021 0817 Last data filed at 12/12/2021 0600 Gross per 24 hour  Intake 600 ml  Output 1570 ml  Net -970 ml    There were no vitals filed for this visit. 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  Other issue is underlying severe tracheobronchomalacia.  Seems to do better with larger lumen trach. Hx asthma, OSA Upsized to 6 prox XLT 12/30 - Needs home trach humidification prior to DC: TOC consult placed for this - Continue TC - Continue nebs as ordered - Finish out CAP course, f/u BAL data from 12/30: neg to date - Given history of cardiac arrest from tracheal plugging, I would keep until Tuesday so we can assure no recurrence and have good outpatient plan - Will see again Tuesday  HTN CKD stage IV Type 2 diabetes Anemia 2/2 CKD Per primary  Erskine Emery MD PCCM

## 2021-12-13 LAB — CBC
HCT: 30.6 % — ABNORMAL LOW (ref 36.0–46.0)
Hemoglobin: 9.1 g/dL — ABNORMAL LOW (ref 12.0–15.0)
MCH: 28.1 pg (ref 26.0–34.0)
MCHC: 29.7 g/dL — ABNORMAL LOW (ref 30.0–36.0)
MCV: 94.4 fL (ref 80.0–100.0)
Platelets: 216 10*3/uL (ref 150–400)
RBC: 3.24 MIL/uL — ABNORMAL LOW (ref 3.87–5.11)
RDW: 13.9 % (ref 11.5–15.5)
WBC: 8 10*3/uL (ref 4.0–10.5)
nRBC: 0 % (ref 0.0–0.2)

## 2021-12-13 LAB — BASIC METABOLIC PANEL
Anion gap: 7 (ref 5–15)
BUN: 25 mg/dL — ABNORMAL HIGH (ref 6–20)
CO2: 27 mmol/L (ref 22–32)
Calcium: 9.9 mg/dL (ref 8.9–10.3)
Chloride: 106 mmol/L (ref 98–111)
Creatinine, Ser: 2.2 mg/dL — ABNORMAL HIGH (ref 0.44–1.00)
GFR, Estimated: 26 mL/min — ABNORMAL LOW (ref >60)
Glucose, Bld: 235 mg/dL — ABNORMAL HIGH (ref 70–99)
Potassium: 4.1 mmol/L (ref 3.5–5.1)
Sodium: 140 mmol/L (ref 135–145)

## 2021-12-13 LAB — GLUCOSE, CAPILLARY
Glucose-Capillary: 190 mg/dL — ABNORMAL HIGH (ref 70–99)
Glucose-Capillary: 212 mg/dL — ABNORMAL HIGH (ref 70–99)
Glucose-Capillary: 212 mg/dL — ABNORMAL HIGH (ref 70–99)
Glucose-Capillary: 295 mg/dL — ABNORMAL HIGH (ref 70–99)
Glucose-Capillary: 258 mg/dL — ABNORMAL HIGH (ref 70–99)

## 2021-12-13 MED ORDER — INSULIN ASPART 100 UNIT/ML IJ SOLN
0.0000 [IU] | Freq: Every day | INTRAMUSCULAR | Status: DC
  Administered 2021-12-13: 3 [IU] via SUBCUTANEOUS
  Administered 2021-12-14 – 2021-12-16 (×2): 2 [IU] via SUBCUTANEOUS

## 2021-12-13 MED ORDER — CHLORHEXIDINE GLUCONATE CLOTH 2 % EX PADS
6.0000 | MEDICATED_PAD | Freq: Every day | CUTANEOUS | Status: DC
  Administered 2021-12-13 – 2021-12-14 (×2): 6 via TOPICAL

## 2021-12-13 MED ORDER — INSULIN GLARGINE-YFGN 100 UNIT/ML ~~LOC~~ SOLN
20.0000 [IU] | Freq: Every day | SUBCUTANEOUS | Status: DC
  Administered 2021-12-14 – 2021-12-17 (×4): 20 [IU] via SUBCUTANEOUS
  Filled 2021-12-13 (×4): qty 0.2

## 2021-12-13 MED ORDER — CARVEDILOL 25 MG PO TABS
37.5000 mg | ORAL_TABLET | Freq: Two times a day (BID) | ORAL | Status: DC
  Administered 2021-12-13 – 2021-12-17 (×8): 37.5 mg via ORAL
  Filled 2021-12-13 (×7): qty 1

## 2021-12-13 NOTE — Progress Notes (Signed)
Physical Therapy Treatment Patient Details Name: Maureen Jones MRN: [id number] DOB: [birth year] Today's Date: 12/13/2021   History of Present Illness 57 y/o female presented to ED on 12/26 for SOB and nausea. Chest x-ray shows infiltrates concerning for aspiration. Found to have blood clot completing occluding airway. Trach changed on 12/28. Pt again with occlusion of trach on 12/30 and moved to ICU and trach upsized. PMH: HTN, DM, trach.    PT Comments    Pt making excellent progress with mobility. Ready for DC home from PT standpoint and will likely dc from acute PT soon.    Recommendations for follow up therapy are one component of a multi-disciplinary discharge planning process, led by the attending physician.  Recommendations may be updated based on patient status, additional functional criteria and insurance authorization.  Follow Up Recommendations  No PT follow up     Assistance Recommended at Discharge Intermittent Supervision/Assistance  Equipment Recommendations  None recommended by PT    Recommendations for Other Services       Precautions / Restrictions Precautions Precautions: Other (comment) Precaution Comments: trach     Mobility  Bed Mobility Overal bed mobility: Modified Independent             General bed mobility comments: Pt up in chair    Transfers Overall transfer level: Modified independent Equipment used: None Transfers: Sit to/from Stand Sit to Stand: Modified independent (Device/Increase time)                Ambulation/Gait Ambulation/Gait assistance: Modified independent (Device/Increase time) Gait Distance (Feet): 375 Feet Assistive device: None Gait Pattern/deviations: WFL(Within Functional Limits)   Gait velocity interpretation: >2.62 ft/sec, indicative of community ambulatory   General Gait Details: Steady gait without assistive device   Stairs             Wheelchair Mobility    Modified Rankin (Stroke  Patients Only)       Balance Overall balance assessment: No apparent balance deficits (not formally assessed)   Sitting balance-[name]: Good       Standing balance-[name]: Good                              Cognition Arousal/Alertness: Awake/alert Behavior During Therapy: WFL for tasks assessed/performed Overall Cognitive Status: Within Functional Limits for tasks assessed                                          Exercises      General Comments General comments (skin integrity, edema, etc.): VSS on 28% trach collar      Pertinent Vitals/Pain Pain Assessment: No/denies pain    Home Living                          Prior Function            PT Goals (current goals can now be found in the care plan section) Progress towards PT goals: Progressing toward goals    Frequency    Min 3X/week      PT Plan Current plan remains appropriate    Co-evaluation              AM-PAC PT "6 Clicks" Mobility   Outcome Measure  Help needed turning from your back to your side while in a flat  bed without using bedrails?: None Help needed moving from lying on your back to sitting on the side of a flat bed without using bedrails?: None Help needed moving to and from a bed to a chair (including a wheelchair)?: None Help needed standing up from a chair using your arms (e.g., wheelchair or bedside chair)?: None Help needed to walk in hospital room?: None Help needed climbing 3-5 steps with a railing? : A Little 6 Click Score: 23    End of Session Equipment Utilized During Treatment: Oxygen Activity Tolerance: Patient tolerated treatment well Patient left: in chair;with call bell/phone within reach Nurse Communication: Mobility status PT Visit Diagnosis: Other abnormalities of gait and mobility (R26.89);Difficulty in walking, not elsewhere classified (R26.2);Muscle weakness (generalized) (M62.81)     Time: 1610-9604 PT  Time Calculation (min) (ACUTE ONLY): 14 min  Charges:  $Gait Training: 8-22 mins                     [place] Pager [phone] Office [place] 12/13/2021, 12:57 PM

## 2021-12-13 NOTE — Progress Notes (Signed)
SLP Note: Late Entry  Note populated for Herbie Baltimore, SLP with clinical impressions as follows:  Pt was unable to redirect air through upper airway with PMSV placed on 6 cuffed XLT Shiley to achieve adequate phonation or respiration. Pt appleied effort and was able to have some dysphonic sound; when PMSV removed immediately after there was a large burs tof air suggest incomplete passage of air to upper airway. Pt may need a cuffless trach in order to achieve phonation with PMSV. Pt is eating and drinking well and able to communicate with mouthing words with articualtion sounds. Also mentioned the benefit of HME use to pt and RT. They are at her bedside, but have not been used. Will f/u.    12/12/21 1400  SLP Visit Information  SLP Received On 12/12/21  General Information  HPI 56 year old female former smoker with asthma s/p tracheostomy 10/05/21 secondary to supraglottic edema and VCD admitted secondary to aspiration and clogged tracheostomy that was suctioned. Started on antibiotics and nebulizers for presumed pneumonia. Prior to hospitalization patient reported running out of respiratory supplies x 1 week.     During this admission pt has continued to have respiratory distress with mucous plugging and has finally had a 6 prox XLT placed on 12/30 with some success. Bronch has shown dynamic airway collapse.  Of note, patient had recent prolonged hospitalization earlier this month (Oct/Nov 2022) for failed extubation due to supraglottic edema requiring tracheostomy. During this admission had PEA arrest (11/2) after dislodged trach. Redo tracheostomy with 6 XLT. Was changed to cuffless 5 proximal XLT on 10/25/21. SLP saw pt throughout hspitalization, was ultimately independent with PMSV placement and precautions, participating in voice therapy prior to d/c. Pt had two MBS both showing some weakness and delay with penetration of thin liquids, recommended Dys 3/nectar, but upgraded to thin with  precautions for single sips and no straws and tolerated well.  Tracheostomy Shiley XLT Proximal 6 mm Cuffed  Placement Date/Time: 12/10/21 1227   Placed By: ICU physician  Brand: Shiley XLT Proximal  Size (mm): 6 mm  Style: Cuffed  Status Secured  Site Assessment Clean;Dry  Site Care Cleansed  Inner Cannula Care Changed/new  Ties Assessment Changed;Clean;Dry;Secure  Tracheostomy Equipment at bedside Yes and checklist posted at head of bed  Additional Tracheostomy Tube Assessment  Trach Collar Period all waking hours  Ventilator Dependency  Vent Dependent No  Cuff Deflation Trial  Tolerated Cuff Deflation Yes  PMSV Trial  PMSV was placed for 5 seconds  Able to redirect subglottic air through upper airway No  Able to Attain Phonation Yes  Voice Quality Hoarse  Able to Expectorate Secretions No attempts  Level of Secretion Expectoration with PMSV Not observed  Breath Support for Phonation Adequate  SLP - End of Session  Patient left in chair  Assessment  Clinical Impression Statement (ACUTE ONLY) Pt was unable to redirect air through upper airway with PMSV placed on 6 cuffed XLT Shiley to achieve adequate phonation or respiration. Pt appleied effort and was able to have some dysphonic sound; when PMSV removed immediately after there was a large burs tof air suggest incomplete passage of air to upper airway. Pt may need a cuffless trach in order to achieve phonation with PMSV. Pt is eating and drinking well and able to communicate with mouthing words with articualtion sounds. Also mentioned the benefit of HME use to pt and RT. They are at her bedside, but have not been used. Will f/u.  SLP Visit  Diagnosis Aphonia (R49.1)  SLP Recommendation/Assessment Patient needs continued Speech Lanaguage Pathology Services  Problem List Verbal expression  PMSV - Therapy Diagnosis Dyphonia  Plan  Speech Therapy Frequency (ACUTE ONLY) min 1 x/week  Duration 2 weeks  Treatment/Interventions SLP  instruction and feedback  Potential to Achieve Goals (ACUTE ONLY) Good  SLP Recommendations  Patient may use Passy-Muir Speech Valve with SLP only  MD: Please consider changing trach tube to  Cuffless  SLP Time Calculation  SLP Start Time (ACUTE ONLY) 1330  SLP Stop Time (ACUTE ONLY) 1340  SLP Time Calculation (min) (ACUTE ONLY) 10 min  SLP Evaluations  $ SLP Speech Visit 1 Visit  SLP Evaluations  $ SLP Eval Voice Prosthetic Device Procedure  $$ Passy Damita Lack Speaking Valve yes    Osie Bond., M.A. [place] Acute Environmental education officer [phone] Office [phone]

## 2021-12-13 NOTE — Progress Notes (Signed)
Occupational Therapy Treatment Patient Details Name: Maureen Jones MRN: [id number] DOB: 1966-05-28 Today's Date: 12/13/2021   History of present illness 56 y/o female presented to ED on 12/26 for SOB and nausea. Chest x-ray shows infiltrates concerning for aspiration. Found to have blood clot completing occluding airway. Trach changed on 12/28. Pt again with occlusion of trach on 12/30 and moved to ICU and trach upsized. PMH: HTN, DM, trach.   OT comments  Pt continues to progress well. Able to easily access feet for bathing and dressing, supervised for OOB, primarily for lines, completed grooming. Remained up in chair at end of session.    Recommendations for follow up therapy are one component of a multi-disciplinary discharge planning process, led by the attending physician.  Recommendations may be updated based on patient status, additional functional criteria and insurance authorization.    Follow Up Recommendations  No OT follow up    Assistance Recommended at Discharge Intermittent Supervision/Assistance  Equipment Recommendations  None recommended by OT    Recommendations for Other Services      Precautions / Restrictions Precautions Precautions: Other (comment) Precaution Comments: trach Restrictions Weight Bearing Restrictions: No       Mobility Bed Mobility Overal bed mobility: Modified Independent             General bed mobility comments: HOB up    Transfers Overall transfer level: Needs assistance Equipment used: None Transfers: Sit to/from Stand Sit to Stand: Supervision                 Balance     Sitting balance-[name]: Good       Standing balance-[name]: Good                             ADL either performed or assessed with clinical judgement   ADL Overall ADL's : Needs assistance/impaired     Grooming: Oral care;Wash/dry hands;Wash/dry face;Supervision/safety;Standing               Lower Body  Dressing: Set up;Sitting/lateral leans Lower Body Dressing Details (indicate cue type and reason): socks Toilet Transfer: Supervision/safety;Ambulation;Regular Toilet   Toileting- Water quality scientist and Hygiene: Supervision/safety;Sit to/from stand;Sitting/lateral lean       Functional mobility during ADLs: Supervision/safety      Extremity/Trunk Assessment              Vision       Perception     Praxis      Cognition Arousal/Alertness: Awake/alert Behavior During Therapy: WFL for tasks assessed/performed Overall Cognitive Status: Within Functional Limits for tasks assessed                                            Exercises     Shoulder Instructions       General Comments      Pertinent Vitals/ Pain       Pain Assessment: No/denies pain  Home Living                                          Prior Functioning/Environment              Frequency  Min 2X/week        Progress Toward  Goals  OT Goals(current goals can now be found in the care plan section)  Progress towards OT goals: Progressing toward goals  Acute Rehab OT Goals OT Goal Formulation: With patient Time For Goal Achievement: 12/23/21 Potential to Achieve Goals: Good  Plan Frequency remains appropriate;Discharge plan needs to be updated    Co-evaluation                 AM-PAC OT "6 Clicks" Daily Activity     Outcome Measure   Help from another person eating meals?: None Help from another person taking care of personal grooming?: A Little Help from another person toileting, which includes using toliet, bedpan, or urinal?: A Little Help from another person bathing (including washing, rinsing, drying)?: A Little Help from another person to put on and taking off regular upper body clothing?: None Help from another person to put on and taking off regular lower body clothing?: A Little 6 Click Score: 20    End of Session Equipment  Utilized During Treatment: Oxygen (5L, 28%)  OT Visit Diagnosis: Other abnormalities of gait and mobility (R26.89)   Activity Tolerance Patient tolerated treatment well   Patient Left in chair;with call bell/phone within reach;with nursing/sitter in room   Nurse Communication          Time: 3888-2800 OT Time Calculation (min): 30 min  Charges: OT General Charges $OT Visit: 1 Visit OT Treatments $Self Care/Home Management : 23-37 mins  Nestor Lewandowsky, OTR/L Acute Rehabilitation Services Pager: [phone] Office: [phone]   Malka So 12/13/2021, 9:37 AM

## 2021-12-13 NOTE — Progress Notes (Signed)
[facility] night cross cover note:  I was notified by RN that patient's qhs CBG is 295.  I subsequently ordered qhs sliding scale insulin coverage.    Babs Bertin, DO Hospitalist

## 2021-12-13 NOTE — Progress Notes (Signed)
Progress Note    Maureen Jones  LSL:[id number] DOB: [birth year]  DOA: 12/06/2021 PCP: Flossie Buffy, NP    Brief Narrative:     Medical records reviewed and are as summarized below:  Maureen Jones is an 56 y.o. female with history of recent admission for respiratory failure in the setting of hypertensive emergency and hypoxia due to vocal cord dysfunction and supraglottic edema and asthma exacerbation was initially intubated and was on ventilator and subsequently had to have a tracheostomy was discharged on November 15, 2021.  She has been out of her suction supplies for about 1 week.  Continues to have issues with plugging.  S/p trach change.  Patient to remain in hospital until [facility] can educate family and procure her supplies.     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 -around 2  Anemia  -appears to be chronic hemoglobin is at baseline. -unclear kind but prob CKD  History of asthma  - continue with  nebulizer. -add claritin  Hypomagnesemia -repleted   Family Communication/Anticipated D/C date and plan/Code Status   DVT prophylaxis: Lovenox ordered. Code Status: partial Disposition Plan: Status is: Inpatient  Remains inpatient appropriate because: here until education provided    Medical Consultants:   PCCM  Subjective:   Cough worse wen she turns her head  Objective:    Vitals:   12/13/21 1100 12/13/21 1131 12/13/21 1132 12/13/21 1200  BP: (!) 153/79 (!) 153/79  (!) 144/76  Pulse: 79 82 80 82  Resp: (!) _0 Temp:   98.6 F (37 C)   TempSrc:   Oral   SpO2: 99% 96% 99% 98%    Intake/Output Summary (Last 24 hours) at 12/13/2021 1331 Last data filed at 12/13/2021 0800 Gross per 24 hour  Intake 600 ml  Output 200 ml  Net 400 ml   There were no vitals filed for this visit.  Exam:   General: Appearance:    Obese female in no acute distress     Lungs:     respirations unlabored, trach in place  Heart:    Normal heart rate.    MS:   All extremities are intact.    Neurologic:   Awake, alert, oriented x 3. No apparent focal neurological           defect.       Data Reviewed:   I have personally reviewed following labs and imaging studies:  Labs: Labs show the following:   Basic Metabolic Panel: Recent Labs  Lab 12/07/21 0610 12/08/21 0230 12/10/21 0230 12/11/21 1310 12/12/21 0040  12/13/21 0103  NA 138 141 138 139 139 140  K 4.9 4.2 3.8 4.1 4.7 4.1  CL 106 107 107 106 108 106  CO2 _0 GLUCOSE 337* 158* 96 163* 225* 235*  BUN 34* 25* 23* 21* 25* 25*  CREATININE 2.40* 2.00* 1.86* 1.82* 1.88* 2.20*  CALCIUM 9.4 9.8 9.5 9.5 9.8 9.9  MG 1.7  --   --   --   --   --    GFR CrCl cannot be calculated (Unknown ideal weight.). Liver Function Tests: Recent Labs  Lab 12/06/21 2147  AST 14*  ALT 14  ALKPHOS 111  BILITOT 0.6  PROT 6.5  ALBUMIN 3.3*   No results for input(s): LIPASE, AMYLASE in the last 168 hours. No  results for input(s): AMMONIA in the last 168 hours. Coagulation profile Recent Labs  Lab 12/06/21 2147  INR 1.0    CBC: Recent Labs  Lab 12/06/21 2147 12/07/21 0610 12/08/21 0230 12/10/21 0230 12/11/21 1310 12/12/21 0040 12/13/21 0103  WBC 14.2*   < > 10.1 9.9 10.2 8.6 8.0  NEUTROABS 12.1*  --   --   --   --   --   --   HGB 9.4*   < > 9.1* 9.3* 9.4* 10.0* 9.1*  HCT 30.2*   < > 29.9* 29.5* 31.0* 32.0* 30.6*  MCV 95.6   < > 93.1 93.4 93.9 93.3 94.4  PLT 205   < > 221 207 216 222 216   < > = values in this interval not displayed.   Cardiac Enzymes: No results for input(s): CKTOTAL, CKMB, CKMBINDEX, TROPONINI in the last 168 hours. BNP (last 3 results) No results for input(s): PROBNP in the last 8760 hours. CBG: Recent Labs  Lab 12/12/21 1131 12/12/21 1610 12/12/21 2204 12/13/21 0800 12/13/21 1130  GLUCAP 203* 214* 236* 190* 212*   D-Dimer: No results for input(s): DDIMER in the last 72 hours. Hgb A1c: No results for input(s): HGBA1C in the last 72 hours. Lipid Profile: No results for input(s): CHOL, HDL, LDLCALC, TRIG, CHOLHDL, LDLDIRECT in the last 72 hours. Thyroid function studies: No results for input(s): TSH, T4TOTAL, T3FREE, THYROIDAB in the last 72 hours.  Invalid input(s): FREET3 Anemia work up: No results for input(s): VITAMINB12, FOLATE, FERRITIN, TIBC, IRON, RETICCTPCT in the last 72 hours. Sepsis Labs: Recent Labs  Lab 12/06/21 2147 12/07/21 0610 12/10/21 0230 12/11/21 1310 12/12/21 0040 12/13/21 0103  WBC 14.2*   < > 9.9 10.2 8.6 8.0  LATICACIDVEN 1.2  --   --   --   --   --    < > = values in this interval not displayed.    Microbiology Recent Results (from the past 240 hour(s))  Resp Panel by RT-PCR (Flu A&B, Covid) Nasopharyngeal Swab     Status: None   Collection Time: 12/06/21  9:23 PM   Specimen: Nasopharyngeal Swab; Nasopharyngeal(NP) swabs in vial transport medium  Result Value Ref Range Status   SARS Coronavirus 2 by RT PCR  NEGATIVE NEGATIVE Final    Comment: (NOTE) SARS-CoV-2 target nucleic acids are NOT DETECTED.  The SARS-CoV-2 RNA is generally detectable in upper respiratory specimens during the acute phase of infection. The lowest concentration of SARS-CoV-2 viral copies this assay can detect is 138 copies/mL. A negative result does not preclude SARS-Cov-2 infection and should not be used as the sole basis for treatment or other patient management decisions. A negative result may occur with  improper  specimen collection/handling, submission of specimen other than nasopharyngeal swab, presence of viral mutation(s) within the areas targeted by this assay, and inadequate number of viral copies(<138 copies/mL). A negative result must be combined with clinical observations, patient history, and epidemiological information. The expected result is Negative.  Fact Sheet for Patients:  EntrepreneurPulse.com.au  Fact Sheet for Healthcare Providers:  IncredibleEmployment.be  This test is no t yet approved or cleared by the [place] FDA and  has been authorized for detection and/or diagnosis of SARS-CoV-2 by FDA under an Emergency Use Authorization (EUA). This EUA will remain  in effect (meaning this test can be used) for the duration of the COVID-19 declaration under Section 564(b)(1) of the Act, 21 U.S.C.section 360bbb-3(b)(1), unless the authorization is terminated  or revoked sooner.       Influenza A by PCR NEGATIVE NEGATIVE Final   Influenza B by PCR NEGATIVE NEGATIVE Final    Comment: (NOTE) The Xpert Xpress SARS-CoV-2/FLU/RSV plus assay is intended as an aid in the diagnosis of influenza from Nasopharyngeal swab specimens and should not be used as a sole basis for treatment. Nasal washings and aspirates are unacceptable for Xpert Xpress SARS-CoV-2/FLU/RSV testing.  Fact Sheet for Patients: EntrepreneurPulse.com.au  Fact Sheet for  Healthcare Providers: IncredibleEmployment.be  This test is not yet approved or cleared by the [place] FDA and has been authorized for detection and/or diagnosis of SARS-CoV-2 by FDA under an Emergency Use Authorization (EUA). This EUA will remain in effect (meaning this test can be used) for the duration of the COVID-19 declaration under Section 564(b)(1) of the Act, 21 U.S.C. section 360bbb-3(b)(1), unless the authorization is terminated or revoked.  Performed at [facility], [place] [address], [place], [place] [address]   Blood Culture (routine x 2)     Status: None   Collection Time: 12/06/21  9:23 PM   Specimen: BLOOD RIGHT HAND  Result Value Ref Range Status   Specimen Description BLOOD RIGHT HAND  Final   Special Requests   Final    BOTTLES DRAWN AEROBIC AND ANAEROBIC Blood Culture adequate volume   Culture   Final    NO GROWTH 5 DAYS Performed at [facility], [place] [address] [place], [place] [address]    Report Status 12/11/2021 FINAL  Final  Blood Culture (routine x 2)     Status: None   Collection Time: 12/06/21  9:40 PM   Specimen: BLOOD LEFT FOREARM  Result Value Ref Range Status   Specimen Description BLOOD LEFT FOREARM  Final   Special Requests   Final    BOTTLES DRAWN AEROBIC AND ANAEROBIC Blood Culture adequate volume   Culture   Final    NO GROWTH 5 DAYS Performed at [facility], [place] [address] [place], [place] [address]    Report Status 12/11/2021 FINAL  Final  Culture, Respiratory w Gram Stain     Status: None   Collection Time: 12/06/21 10:30 PM   Specimen: Tracheal Aspirate  Result Value Ref Range Status   Specimen Description TRACHEAL ASPIRATE  Final   Special Requests NONE  Final   Gram Stain   Final    MODERATE WBC PRESENT, PREDOMINANTLY MONONUCLEAR FEW GRAM POSITIVE COCCI IN PAIRS RARE GRAM POSITIVE RODS    Culture   Final    Normal respiratory flora-no Staph aureus or Pseudomonas seen Performed at  [facility], [place] [address], [place], [place] [address]    Report Status 12/09/2021 FINAL  Final  Urine Culture     Status: Abnormal   Collection Time: 12/07/21 12:11 PM   Specimen: In/Out Cath Urine  Result Value Ref Range Status   Specimen Description IN/OUT CATH URINE  Final   Special Requests   Final    NONE Performed at [facility], [address] [place], [place] [address]    Culture (A)  Final    10,000 COLONIES/mL MULTIPLE SPECIES PRESENT, SUGGEST RECOLLECTION   Report Status 12/08/2021 FINAL  Final  Culture, Respiratory w Gram Stain     Status: None   Collection Time: 12/10/21  1:49 PM   Specimen: Bronchoalveolar Lavage; Respiratory  Result Value Ref Range Status   Specimen Description BRONCHIAL ALVEOLAR LAVAGE  Final   Special Requests LLL  Final   Gram Stain   Final    RARE WBC PRESENT,BOTH PMN AND MONONUCLEAR NO ORGANISMS SEEN    Culture   Final    NO GROWTH 2 DAYS Performed at [facility], [address], [place],  [address]    Report Status 12/12/2021 FINAL  Final    Procedures and diagnostic studies:  No results found.  Medications:    amLODipine  10 mg Oral Daily   aspirin EC  81 mg Oral Daily   atorvastatin  20 mg Oral Daily   budesonide  0.25 mg Nebulization BID   carvedilol  25 mg Oral BID   Chlorhexidine Gluconate Cloth  6 each Topical Daily   clonazepam  0.25 mg Oral QHS   DULoxetine  60 mg Oral Daily   enoxaparin (LOVENOX) injection  40 mg Subcutaneous Q24H   fluticasone  2 spray Each Nare Daily   gabapentin  100 mg Oral BID   guaiFENesin  1,200 mg Oral BID   hydrALAZINE  100 mg Oral TID   insulin aspart  0-9 Units Subcutaneous TID WC   insulin glargine-yfgn  10 Units Subcutaneous Daily   loratadine  10 mg Oral Daily   losartan  50 mg Oral Daily   pantoprazole  40 mg Oral BID   senna  2 tablet Oral Daily   tranexamic acid  500 mg Nebulization Once   traZODone  50 mg Oral QHS   Continuous Infusions:      LOS: 6 days   Geradine Girt  [facility]   How to contact the [facility] Attending or Consulting provider [place] or covering provider during after hours [place], for this patient?  Check the care team in [facility] and look for a) attending/consulting TRH provider listed and b) the [facility] team listed Log into [email or web address] and use [place]'s universal password to access. If you do not have the password, please contact the hospital operator. Locate the [facility] provider you are looking for under [facility] and page to a number that you can be directly reached. If you still have difficulty reaching the provider, please page the [facility] (Director on Call) for the Hospitalists listed on amion for assistance.  12/13/2021, 1:31 PM

## 2021-12-13 NOTE — TOC Progression Note (Signed)
Transition of Care [facility]) - Progression Note    Patient Details  Name: Maureen Jones MRN: [id number] Date of Birth: 09/28/1966  Transition of Care [facility]) CM/SW Contact  Graves-Bigelow, Ocie Cornfield, RN Phone Number: 12/13/2021, 1:43 PM  Clinical Narrative: Case Manager spoke with patient and caregiver Maureen Jones @ [phone]. Maureen Jones had questions regarding supplies for home including: 14 french suction catheters, fenestrated gauze, portable oxygen concentrator for home, and trach humidification masks. The patient was previously active with [facility]- Physical Therapy, Occupational Therapy and Speech Therapy-patient will need resumption orders for home. Case Manager reached out to  [facility] and they will be able to assist with providing the trach humidification masks. [facility] Liaison Maureen Jones will ask the office to arrange a home assessment for the new nighttime vent for home. [facility] will get orders and settings from the provider. [facility] will be unable to arrange a portable oxygen concentrator for appointments because the setting only go up to 3 Liters and the patient is on 4-4.5 liters. [facility] will look into the D tanks and what will supply the patient best for outside appointments. The unit the patient discharges from will need to order 14 french suction catheters for home, and supply the fenestrated gauze. Case Manager spoke with Maureen Jones and she feels like she is doing the trach care well; however has some issues with placing a clean gauze on the patient once completed. Respiratory will need to make sure that the family has been educated properly on trach management and supply usage due to cost. Case Manager will continue to follow for additional needs.   Expected Discharge Plan: [place] Barriers to Discharge: Continued Medical Work up  Expected Discharge Plan and Services Expected Discharge Plan: [place] In-house Referral: NA Discharge Planning Services: CM Consult Post  Acute Care Choice: [place], Resumption of Svcs/PTA Provider Living arrangements for the past 2 months: Mobile Home                 DME Arranged: Ventilator, Trach supplies (trach humidification mask-[facility] to supply) DME Agency: [facility] Date DME Agency Contacted: 12/13/21 Time DME Agency Contacted: 36 Representative spoke with at DME Agency: Maureen Jones HH Arranged: PT, OT, Speech Therapy HH Agency: [facility] Date [place]: 12/13/21 Time [place]: 1200 Representative spoke with at [place] (Left vociemail)   Readmission Risk Interventions Readmission Risk Prevention Plan 12/13/2021 11/15/2021  Transportation Screening Complete Complete  PCP or Specialist Appt within 3-5 Days Complete Complete  HRI or [place] Complete Complete  Social Work Consult for [place] Planning/Counseling Complete Patient refused  Palliative Care Screening Not Applicable Not Applicable  Medication Review Press photographer) Complete Complete

## 2021-12-14 LAB — GLUCOSE, CAPILLARY
Glucose-Capillary: 171 mg/dL — ABNORMAL HIGH (ref 70–99)
Glucose-Capillary: 216 mg/dL — ABNORMAL HIGH (ref 70–99)
Glucose-Capillary: 246 mg/dL — ABNORMAL HIGH (ref 70–99)
Glucose-Capillary: 233 mg/dL — ABNORMAL HIGH (ref 70–99)
Glucose-Capillary: 255 mg/dL — ABNORMAL HIGH (ref 70–99)

## 2021-12-14 LAB — CBC
HCT: 28.3 % — ABNORMAL LOW (ref 36.0–46.0)
Hemoglobin: 8.9 g/dL — ABNORMAL LOW (ref 12.0–15.0)
MCH: 29.2 pg (ref 26.0–34.0)
MCHC: 31.4 g/dL (ref 30.0–36.0)
MCV: 92.8 fL (ref 80.0–100.0)
Platelets: 212 10*3/uL (ref 150–400)
RBC: 3.05 MIL/uL — ABNORMAL LOW (ref 3.87–5.11)
RDW: 14 % (ref 11.5–15.5)
WBC: 9.4 10*3/uL (ref 4.0–10.5)
nRBC: 0 % (ref 0.0–0.2)

## 2021-12-14 LAB — BASIC METABOLIC PANEL
Anion gap: 7 (ref 5–15)
BUN: 27 mg/dL — ABNORMAL HIGH (ref 6–20)
CO2: 26 mmol/L (ref 22–32)
Calcium: 9.5 mg/dL (ref 8.9–10.3)
Chloride: 106 mmol/L (ref 98–111)
Creatinine, Ser: 1.94 mg/dL — ABNORMAL HIGH (ref 0.44–1.00)
GFR, Estimated: 30 mL/min — ABNORMAL LOW (ref >60)
Glucose, Bld: 182 mg/dL — ABNORMAL HIGH (ref 70–99)
Potassium: 4 mmol/L (ref 3.5–5.1)
Sodium: 139 mmol/L (ref 135–145)

## 2021-12-14 LAB — MAGNESIUM: Magnesium: 1.8 mg/dL (ref 1.7–2.4)

## 2021-12-14 MED ORDER — MAGNESIUM SULFATE 2 GM/50ML IV SOLN
2.0000 g | Freq: Once | INTRAVENOUS | Status: AC
  Administered 2021-12-14: 2 g via INTRAVENOUS
  Filled 2021-12-14: qty 50

## 2021-12-14 NOTE — Progress Notes (Signed)
Progress Note    [name]  QMG:[id number] DOB: 1966/07/26  DOA: 12/06/2021 PCP: Flossie Buffy, NP    Brief Narrative:     Medical records reviewed and are as summarized below:  Maureen Jones is an 56 y.o. female with history of recent admission for respiratory failure in the setting of hypertensive emergency and hypoxia due to vocal cord dysfunction and supraglottic edema and asthma exacerbation was initially intubated and was on ventilator and subsequently had to have a tracheostomy was discharged on November 15, 2021.  She has been out of her suction supplies for about 1 week.  Continues to have issues with plugging.  S/p trach change-- going to cuffless 1/3.  Patient to remain in hospital until [employer] can educate family and procure her supplies.  Suspect a 1/4 d/c home   Assessment/Plan:   Principal Problem:   Acute respiratory failure with hypoxia (HCC) Active Problems:   Tracheostomy tube present (HCC)   Essential hypertension   OSA (obstructive sleep apnea)   DM (diabetes mellitus) (HCC)   CKD (chronic kidney disease) stage 4, GFR 15-29 ml/min (HCC)   Pneumonia   Acute respiratory failure with hypoxemia (HCC)    Acute respiratory failure with hypoxia with severe tracheobronchomalacia -Chest x-ray did show infiltrates concerning for pneumonia so was treated with antibiotics x 5 days -nebulizer and frequent suctioning -mucinex -PCCM consult appreciated: Needs home trach humidification prior to DC  -x ray 12/28: improving atelectasis switch to cuffless prior to discharge per PCCM (plan for 1/3? And watch overnight) -patient needs [employer] to provide family education and get supplies  Hypertension  - on amlodipine, Coreg, and hydralazine   Diabetes mellitus type 2 with hyperglycemia  -insulin resumed at lower dose- adjust now that patient is eating -SSI  Chronic kidney disease stage IV  -creatinine appears to be at baseline. -around 2  Anemia   -appears to be chronic hemoglobin is at baseline. -unclear kind but prob CKD  History of asthma  - continue with nebulizer. -add claritin  Hypomagnesemia -repleted   Family Communication/Anticipated D/C date and plan/Code Status   DVT prophylaxis: Lovenox ordered. Code Status: partial Disposition Plan: Status is: Inpatient  Remains inpatient appropriate because: here until education provided    Medical Consultants:   PCCM  Subjective:   No SOB, cough better  Objective:    Vitals:   12/14/21 0827 12/14/21 1000 12/14/21 1100 12/14/21 1131  BP: (!) 147/76 (!) 155/72 (!) 163/79   Pulse: 88 88  93  Resp: 16 18  (!) 24  Temp:      TempSrc:      SpO2: 100% 98%  100%   No intake or output data in the 24 hours ending 12/14/21 1422  There were no vitals filed for this visit.  Exam:   General: Appearance:    Obese female in no acute distress     Lungs:     Trach in place, respirations unlabored  Heart:    Normal heart rate.    MS:   All extremities are intact.    Neurologic:   Awake, alert, oriented x 3. No apparent focal neurological           defect.         Data Reviewed:   I have personally reviewed following labs and imaging studies:  Labs: Labs show the following:   Basic Metabolic Panel: Recent Labs  Lab 12/10/21 0230 12/11/21 1310 12/12/21 0040 12/13/21 0103 12/14/21 0600  NA  138 139 139 140 139  K 3.8 4.1 4.7 4.1 4.0  CL 107 106 108 106 106  CO2 _0 GLUCOSE 96 163* 225* 235* 182*  BUN 23* 21* 25* 25* 27*  CREATININE 1.86* 1.82* 1.88* 2.20* 1.94*  CALCIUM 9.5 9.5 9.8 9.9 9.5  MG  --   --   --   --  1.8   GFR CrCl cannot be calculated (Unknown ideal weight.). Liver Function Tests: No results for input(s): AST, ALT, ALKPHOS, BILITOT, PROT, ALBUMIN in the last 168 hours.  No results for input(s): LIPASE, AMYLASE in the last 168 hours. No results for input(s): AMMONIA in the last 168 hours. Coagulation profile No  results for input(s): INR, PROTIME in the last 168 hours.   CBC: Recent Labs  Lab 12/10/21 0230 12/11/21 1310 12/12/21 0040 12/13/21 0103 12/14/21 0600  WBC 9.9 10.2 8.6 8.0 9.4  HGB 9.3* 9.4* 10.0* 9.1* 8.9*  HCT 29.5* 31.0* 32.0* 30.6* 28.3*  MCV 93.4 93.9 93.3 94.4 92.8  PLT 207 216 222 216 212   Cardiac Enzymes: No results for input(s): CKTOTAL, CKMB, CKMBINDEX, TROPONINI in the last 168 hours. BNP (last 3 results) No results for input(s): PROBNP in the last 8760 hours. CBG: Recent Labs  Lab 12/13/21 1623 12/13/21 2122 12/13/21 2254 12/14/21 0646 12/14/21 1118  GLUCAP 212* 295* 258* 171* 216*   D-Dimer: No results for input(s): DDIMER in the last 72 hours. Hgb A1c: No results for input(s): HGBA1C in the last 72 hours. Lipid Profile: No results for input(s): CHOL, HDL, LDLCALC, TRIG, CHOLHDL, LDLDIRECT in the last 72 hours. Thyroid function studies: No results for input(s): TSH, T4TOTAL, T3FREE, THYROIDAB in the last 72 hours.  Invalid input(s): FREET3 Anemia work up: No results for input(s): VITAMINB12, FOLATE, FERRITIN, TIBC, IRON, RETICCTPCT in the last 72 hours. Sepsis Labs: Recent Labs  Lab 12/11/21 1310 12/12/21 0040 12/13/21 0103 12/14/21 0600  WBC 10.2 8.6 8.0 9.4    Microbiology Recent Results (from the past 240 hour(s))  Resp Panel by RT-PCR (Flu A&B, Covid) Nasopharyngeal Swab     Status: None   Collection Time: 12/06/21  9:23 PM   Specimen: Nasopharyngeal Swab; Nasopharyngeal(NP) swabs in vial transport medium  Result Value Ref Range Status   SARS Coronavirus 2 by RT PCR NEGATIVE NEGATIVE Final    Comment: (NOTE) SARS-CoV-2 target nucleic acids are NOT DETECTED.  The SARS-CoV-2 RNA is generally detectable in upper respiratory specimens during the acute phase of infection. The lowest concentration of SARS-CoV-2 viral copies this assay can detect is 138 copies/mL. A negative result does not preclude SARS-Cov-2 infection and should not be  used as the sole basis for treatment or other patient management decisions. A negative result may occur with  improper specimen collection/handling, submission of specimen other than nasopharyngeal swab, presence of viral mutation(s) within the areas targeted by this assay, and inadequate number of viral copies(<138 copies/mL). A negative result must be combined with clinical observations, patient history, and epidemiological information. The expected result is Negative.  Fact Sheet for Patients:  EntrepreneurPulse.com.au  Fact Sheet for Healthcare Providers:  IncredibleEmployment.be  This test is no t yet approved or cleared by the [place] FDA and  has been authorized for detection and/or diagnosis of SARS-CoV-2 by FDA under an Emergency Use Authorization (EUA). This EUA will remain  in effect (meaning this test can be used) for the duration of the COVID-19 declaration under Section 564(b)(1) of the Act, 21 U.S.C.section  360bbb-3(b)(1), unless the authorization is terminated  or revoked sooner.       Influenza A by PCR NEGATIVE NEGATIVE Final   Influenza B by PCR NEGATIVE NEGATIVE Final    Comment: (NOTE) The Xpert Xpress SARS-CoV-2/FLU/RSV plus assay is intended as an aid in the diagnosis of influenza from Nasopharyngeal swab specimens and should not be used as a sole basis for treatment. Nasal washings and aspirates are unacceptable for Xpert Xpress SARS-CoV-2/FLU/RSV testing.  Fact Sheet for Patients: EntrepreneurPulse.com.au  Fact Sheet for Healthcare Providers: IncredibleEmployment.be  This test is not yet approved or cleared by the [place] FDA and has been authorized for detection and/or diagnosis of SARS-CoV-2 by FDA under an Emergency Use Authorization (EUA). This EUA will remain in effect (meaning this test can be used) for the duration of the COVID-19 declaration under Section  564(b)(1) of the Act, 21 U.S.C. section 360bbb-3(b)(1), unless the authorization is terminated or revoked.  Performed at [facility], [place] [address], [place], [place] [address]   Blood Culture (routine x 2)     Status: None   Collection Time: 12/06/21  9:23 PM   Specimen: BLOOD RIGHT HAND  Result Value Ref Range Status   Specimen Description BLOOD RIGHT HAND  Final   Special Requests   Final    BOTTLES DRAWN AEROBIC AND ANAEROBIC Blood Culture adequate volume   Culture   Final    NO GROWTH 5 DAYS Performed at [facility], [place] [address] [place], [place] [address]    Report Status 12/11/2021 FINAL  Final  Blood Culture (routine x 2)     Status: None   Collection Time: 12/06/21  9:40 PM   Specimen: BLOOD LEFT FOREARM  Result Value Ref Range Status   Specimen Description BLOOD LEFT FOREARM  Final   Special Requests   Final    BOTTLES DRAWN AEROBIC AND ANAEROBIC Blood Culture adequate volume   Culture   Final    NO GROWTH 5 DAYS Performed at [facility], [place] [address], [place], [place] [address]    Report Status 12/11/2021 FINAL  Final  Culture, Respiratory w Gram Stain     Status: None   Collection Time: 12/06/21 10:30 PM   Specimen: Tracheal Aspirate  Result Value Ref Range Status   Specimen Description TRACHEAL ASPIRATE  Final   Special Requests NONE  Final   Gram Stain   Final    MODERATE WBC PRESENT, PREDOMINANTLY MONONUCLEAR FEW GRAM POSITIVE COCCI IN PAIRS RARE GRAM POSITIVE RODS    Culture   Final    Normal respiratory flora-no Staph aureus or Pseudomonas seen Performed at [facility], [place] [address] [place], [place] [address]    Report Status 12/09/2021 FINAL  Final  Urine Culture     Status: Abnormal   Collection Time: 12/07/21 12:11 PM   Specimen: In/Out Cath Urine  Result Value Ref Range Status   Specimen Description IN/OUT CATH URINE  Final   Special Requests   Final    NONE Performed at [facility], [place] [address], [place], [place] [address]    Culture (A)  Final    10,000 COLONIES/mL MULTIPLE SPECIES PRESENT, SUGGEST RECOLLECTION   Report Status 12/08/2021 FINAL  Final  Culture, Respiratory w Gram Stain     Status: None   Collection Time: 12/10/21  1:49 PM   Specimen: Bronchoalveolar Lavage; Respiratory  Result Value Ref Range Status   Specimen Description BRONCHIAL  ALVEOLAR LAVAGE  Final   Special Requests LLL  Final   Gram Stain   Final    RARE WBC PRESENT,BOTH PMN AND MONONUCLEAR NO ORGANISMS SEEN    Culture   Final    NO GROWTH 2 DAYS Performed at [facility], [address], [place], [place] [address]    Report Status 12/12/2021 FINAL  Final    Procedures and diagnostic studies:  No results found.  Medications:    amLODipine  10 mg Oral Daily   aspirin EC  81 mg Oral Daily   atorvastatin  20 mg Oral Daily   budesonide  0.25 mg Nebulization BID   carvedilol  37.5 mg Oral BID   Chlorhexidine Gluconate Cloth  6 each Topical Daily   clonazepam  0.25 mg Oral QHS   DULoxetine  60 mg Oral Daily   enoxaparin (LOVENOX) injection  40 mg Subcutaneous Q24H   fluticasone  2 spray Each Nare Daily   gabapentin  100 mg Oral BID   guaiFENesin  1,200 mg Oral BID   hydrALAZINE  100 mg Oral TID   insulin aspart  0-5 Units Subcutaneous QHS   insulin aspart  0-9 Units Subcutaneous TID WC   insulin glargine-yfgn  20 Units Subcutaneous Daily   loratadine  10 mg Oral Daily   losartan  50 mg Oral Daily   pantoprazole  40 mg Oral BID   senna  2 tablet Oral Daily   traZODone  50 mg Oral QHS   Continuous Infusions:     LOS: 7 days   Geradine Girt  [facility]   How to contact the [facility] Attending or Consulting provider [place] or covering provider during after hours [place], for this patient?  Check the care team in [facility] and look for a) attending/consulting TRH provider listed and b) the [facility] team listed Log into [email or web address] and use [place]'s universal password to access. If  you do not have the password, please contact the hospital operator. Locate the [facility] provider you are looking for under [facility] and page to a number that you can be directly reached. If you still have difficulty reaching the provider, please page the [facility] (Director on Call) for the Hospitalists listed on amion for assistance.  12/14/2021, 2:22 PM

## 2021-12-14 NOTE — TOC Progression Note (Signed)
Transition of Care [facility]) - Progression Note    Patient Details  Name: Maureen Jones MRN: [id number] Date of Birth: 01-Nov-1966  Transition of Care [facility]) CM/SW Contact  Graves-Bigelow, Ocie Cornfield, RN Phone Number: 12/14/2021, 12:47 PM  Clinical Narrative: Case Manager spoke with the Critical Care MD and the patient will not need a home vent- clarification made and the patient will need a portable oxygen concentrator (POC). [employer] is aware that the patient will need the POC. Per [employer], the battery life will only be thirty minutes or less when unplugged. [employer] will discuss with family in regards to keeping the device plugged in. Attending has placed orders in for oxygen. Staff RN will assist with educating family in regards to trach care. Family states that the patient will need cone transport once stable. Case Manager will continue to follow for additional needs.   Expected Discharge Plan: [place] Barriers to Discharge: Continued Medical Work up  Expected Discharge Plan and Services Expected Discharge Plan: [place] In-house Referral: NA Discharge Planning Services: CM Consult Post Acute Care Choice: [place], Resumption of Svcs/PTA Provider Living arrangements for the past 2 months: Mobile Home                 DME Arranged: Ventilator, Trach supplies (trach humidification mask-[employer] to supply) DME Agency: [employer] Date DME Agency Contacted: 12/13/21 Time DME Agency Contacted: 84 Representative spoke with at DME Agency: Thedore Mins HH Arranged: PT, OT, Speech Therapy HH Agency: [facility] Date [place]: 12/13/21 Time [place]: 1200 Representative spoke with at [place] (Left vociemail)   Readmission Risk Interventions Readmission Risk Prevention Plan 12/13/2021 11/15/2021  Transportation Screening Complete Complete  PCP or Specialist Appt within 3-5 Days Complete Complete  HRI or [place] Complete  Complete  Social Work Consult for [place] Planning/Counseling Complete Patient refused  Palliative Care Screening Not Applicable Not Applicable  Medication Review Press photographer) Complete Complete

## 2021-12-14 NOTE — Telephone Encounter (Signed)
Forms sent

## 2021-12-14 NOTE — Progress Notes (Signed)
I spoke with Maureen Jones in regards to trach teaching.  I presented her with trach education booklet today.  She said she had trach teaching with last admission and I asked who her backup care giver was and she stated Maureen Jones.  Her number is [phone] per listing in chart.  I asked could we coordinate a time to refresh on trach teaching with Maureen. [name] and she stated she wouldn't come due to transportation issues.  I told her we would reach out to her anyway.  She was already referred to the trach clinic and was to follow up with them as well.

## 2021-12-14 NOTE — Progress Notes (Signed)
NAME:  Maureen Jones, MRN:  [id number], DOB:  03/19/1966, LOS: 7 ADMISSION DATE:  12/06/2021, CONSULTATION DATE:  12/08/21 REFERRING MD:  Eulogio Bear, DO CHIEF COMPLAINT:  Acute hypoxemic respiratory failure   History of Present Illness:  56 year old female former smoker with asthma s/p tracheostomy 10/05/21 secondary to supraglottic edema and VCD admitted for AHRF secondary to aspiration and clogged tracheostomy that was suctioned. Started on antibiotics and nebulizers for presumed pneumonia. Prior to hospitalization patient reported running out of respiratory supplies x 1 week.  Overnight on 12/28, RT was called to bedside due to dyspnea. Trach suctioned with minimal blood secretions. Inner cannula was changed out without improvement. This AM FIO2 increased to 60% however patient continues to be in respiratory distress. Consulted by primary team for worsening acute hypoxemic respiratory failure  Of note, patient had recent prolonged hospitalization earlier this month (Oct/Nov 2022) for failed extubation due to supraglottic edema requiring tracheostomy. During this admission had PEA arrest (11/2) after dislodged trach. Redo tracheostomy with 6 XLT. Was changed to cuffless 5 proximal XLT on 10/25/21. Pertinent  Medical History  S/p trach secondary to supraglottic edema and VCD, asthma, HTN, DM2, CKD stage IV, OSA, chronic anemia  Significant Hospital Events: Including procedures, antibiotic start and stop dates in addition to other pertinent events   12/27 Admitted by [facility] for increased O2 with FIO2 40%, abx and nebs. Cuffless #5 prox XLT in place 12/28 Overnight, worsening respiratory status. PCCM consulted and exchanged trach to cuffed #5 proximal XLT due to clogged trach 12/29 FIO2 weaned to 28% on 5L, changed trach to cuffless 12/30 trach occluded in am. Moved to ICU for trach upsize. Got tranexamic acid neb x 1 1/3 Tolerating trach upsize to 6 proximal XLT  Interim History /  Subjective:  No issues overnight Tolerating trach upsize to 6 proximal XLT No blood or clots on suction  Objective   Blood pressure (!) 147/76, pulse 88, temperature 98.6 F (37 C), temperature source Oral, resp. rate 16, SpO2 100 %.    FiO2 (%):  [21 %-28 %] 21 %  No intake or output data in the 24 hours ending 12/14/21 0843 There were no vitals filed for this visit.  Physical Exam: General: Well-appearing, no acute distress HENT: [place], AT, OP clear, MMM Neck: Size #6 proximal XLT in place, c/d/i Eyes: EOMI, no scleral icterus Respiratory: Clear to auscultation bilaterally.  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  Other issue is underlying severe tracheobronchomalacia.  Seems to do better with larger lumen trach. Hx asthma, OSA Upsized to 6 prox XLT 12/30 - History of cardiac arrest from tracheal plugging, no recurrence of clogging in the last 72 hours - Needs home trach humidification prior to DC - Continue TC - Continue nebs as ordered - Finish out CAP course, f/u BAL data from 12/30 - Will discuss with primary team discharge plan. Will switch to cuffless prior to discharge  HTN CKD stage IV Type 2 diabetes Anemia 2/2 CKD Per primary  Care Time: 39 min  Rodman Pickle, M.D. [facility] Pulmonary/Critical Care Medicine 12/14/2021 8:52 AM   See Amion for personal pager For hours between 7 PM to 7  AM, please call [place] for urgent questions

## 2021-12-15 ENCOUNTER — Telehealth: Payer: Self-pay | Admitting: Nurse Practitioner

## 2021-12-15 DIAGNOSIS — I10 Essential (primary) hypertension: Secondary | ICD-10-CM

## 2021-12-15 DIAGNOSIS — G4733 Obstructive sleep apnea (adult) (pediatric): Secondary | ICD-10-CM

## 2021-12-15 DIAGNOSIS — N184 Chronic kidney disease, stage 4 (severe): Secondary | ICD-10-CM

## 2021-12-15 DIAGNOSIS — Z794 Long term (current) use of insulin: Secondary | ICD-10-CM

## 2021-12-15 DIAGNOSIS — J9601 Acute respiratory failure with hypoxia: Secondary | ICD-10-CM | POA: Diagnosis not present

## 2021-12-15 DIAGNOSIS — E1169 Type 2 diabetes mellitus with other specified complication: Secondary | ICD-10-CM

## 2021-12-15 DIAGNOSIS — Z930 Tracheostomy status: Secondary | ICD-10-CM

## 2021-12-15 LAB — CBC
HCT: 28.4 % — ABNORMAL LOW (ref 36.0–46.0)
Hemoglobin: 8.9 g/dL — ABNORMAL LOW (ref 12.0–15.0)
MCH: 29.3 pg (ref 26.0–34.0)
MCHC: 31.3 g/dL (ref 30.0–36.0)
MCV: 93.4 fL (ref 80.0–100.0)
Platelets: 214 10*3/uL (ref 150–400)
RBC: 3.04 MIL/uL — ABNORMAL LOW (ref 3.87–5.11)
RDW: 13.7 % (ref 11.5–15.5)
WBC: 9.2 10*3/uL (ref 4.0–10.5)
nRBC: 0 % (ref 0.0–0.2)

## 2021-12-15 LAB — BASIC METABOLIC PANEL
Anion gap: 8 (ref 5–15)
BUN: 36 mg/dL — ABNORMAL HIGH (ref 6–20)
CO2: 23 mmol/L (ref 22–32)
Calcium: 9.7 mg/dL (ref 8.9–10.3)
Chloride: 107 mmol/L (ref 98–111)
Creatinine, Ser: 2.04 mg/dL — ABNORMAL HIGH (ref 0.44–1.00)
GFR, Estimated: 28 mL/min — ABNORMAL LOW (ref >60)
Glucose, Bld: 216 mg/dL — ABNORMAL HIGH (ref 70–99)
Potassium: 4.3 mmol/L (ref 3.5–5.1)
Sodium: 138 mmol/L (ref 135–145)

## 2021-12-15 LAB — HEMOGLOBIN A1C
Hgb A1c MFr Bld: 7.8 % — ABNORMAL HIGH (ref 4.8–5.6)
Mean Plasma Glucose: 177.16 mg/dL

## 2021-12-15 LAB — GLUCOSE, CAPILLARY
Glucose-Capillary: 163 mg/dL — ABNORMAL HIGH (ref 70–99)
Glucose-Capillary: 190 mg/dL — ABNORMAL HIGH (ref 70–99)
Glucose-Capillary: 180 mg/dL — ABNORMAL HIGH (ref 70–99)
Glucose-Capillary: 180 mg/dL — ABNORMAL HIGH (ref 70–99)

## 2021-12-15 NOTE — Progress Notes (Signed)
PROGRESS NOTE  Maureen Jones  ION:[id number] DOB: [birth year] DOA: 12/06/2021 PCP: Maureen Buffy, NP   Brief Narrative: Maureen Jones is a 56 y.o. female with history of recent admission for respiratory failure in the setting of hypertensive emergency and hypoxia due to vocal cord dysfunction and supraglottic edema and asthma exacerbation was initially intubated and was on ventilator and subsequently had to have a tracheostomy was discharged on November 15, 2021.  She has been out of her suction supplies for about 1 week. Continues to have issues with plugging.  S/p trach change per PCCM. Adequate education and supplies have been provided. The plan is to exchange for cuffless trach in endoscopy 12/16/2021.   Assessment & Plan: Principal Problem:   Acute respiratory failure with hypoxia (HCC) Active Problems:   Tracheostomy tube present (HCC)   Essential hypertension   OSA (obstructive sleep apnea)   DM (diabetes mellitus) (HCC)   CKD (chronic kidney disease) stage 4, GFR 15-29 ml/min (HCC)   Pneumonia   Acute respiratory failure with hypoxemia (HCC)  Acute respiratory failure with hypoxia with severe tracheobronchomalacia and suspected aspiration pneumonia: Chest x-ray did show infiltrates concerning for pneumonia so was treated with antibiotics x 5 days -nebulizer and frequent suctioning -mucinex -PCCM consult appreciated: Needs home trach humidification prior to DC which has been arranged. Planning to exchange for cuffless trach 1/5 and watch overnight. - CM working diligently to arrange home care needs.   Hypertension  - Continue amlodipine, coreg, and hydralazine    Diabetes mellitus type 2 with hyperglycemia:  - Recheck HbA1c (previously 12.7%) - Continue glargine + aspart SSI.   Chronic kidney disease stage IV: Near baseline. - Avoid nephrotoxins.   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  - Monitor intermittently.    Asthma  - continue with nebulizer. - added claritin    Hypomagnesemia - repleted  Obesity: Estimated body mass index is 37.41 kg/m as calculated from the following:   Height as of this encounter: _0  (1.676 m).   Weight as of 11/22/21: 105.1 kg.  DVT prophylaxis: Lovenox Code Status: Partial - no compressions or defibrillation Family Communication: None at bedside Disposition Plan:  Status is: Inpatient  Remains inpatient appropriate because: Needs exchange to cuffless tracheostomy which is high risk in this patient, requiring endoscopy suite.  Consultants:  PCCM  Subjective: Feels well, breathing better. No fever or choking. No chest pain.   Objective: Vitals:   12/15/21 1224 12/15/21 1322 12/15/21 1328 12/15/21 1422  BP:      Pulse:    81  Resp:    13  Temp:  98.2 F (36.8 C)    TempSrc:  Oral    SpO2:   99% 94%  Height: _1  (1.676 m)       Intake/Output Summary (Last 24 hours) at 12/15/2021 1428 Last data filed at 12/15/2021 1300 Gross per 24 hour  Intake 480 ml  Output --  Net 480 ml   There were no vitals filed for this visit.  Gen: 56 y.o. female in no distress Pulm: Non-labored breathing thru trach. Clear to auscultation bilaterally.  CV: Regular rate and rhythm. No murmur, rub, or gallop. No JVD, stable 1+ pedal edema. GI: Abdomen soft, non-tender, non-distended, with normoactive bowel sounds. No organomegaly or masses felt. Ext: Warm, no deformities Skin: No rashes, lesions or ulcers on visualized skin. Trach site c/d/i Neuro: Alert and oriented. No focal neurological deficits. Psych: Judgement and insight appear normal. Mood & affect appropriate.   Data  Reviewed: I have personally reviewed following labs and imaging studies  CBC: Recent Labs  Lab 12/11/21 1310 12/12/21 0040 12/13/21 0103 12/14/21 0600 12/15/21 0307  WBC 10.2 8.6 8.0 9.4 9.2  HGB 9.4* 10.0* 9.1* 8.9* 8.9*  HCT 31.0* 32.0* 30.6* 28.3* 28.4*  MCV 93.9 93.3 94.4 92.8 93.4  PLT 216 222 216 212 388   Basic Metabolic  Panel: Recent Labs  Lab 12/11/21 1310 12/12/21 0040 12/13/21 0103 12/14/21 0600 12/15/21 0307  NA 139 139 140 139 138  K 4.1 4.7 4.1 4.0 4.3  CL 106 108 106 106 107  CO2 _0 GLUCOSE 163* 225* 235* 182* 216*  BUN 21* 25* 25* 27* 36*  CREATININE 1.82* 1.88* 2.20* 1.94* 2.04*  CALCIUM 9.5 9.8 9.9 9.5 9.7  MG  --   --   --  1.8  --    GFR: CrCl cannot be calculated (Unknown ideal weight.). Liver Function Tests: No results for input(s): AST, ALT, ALKPHOS, BILITOT, PROT, ALBUMIN in the last 168 hours. No results for input(s): LIPASE, AMYLASE in the last 168 hours. No results for input(s): AMMONIA in the last 168 hours. Coagulation Profile: No results for input(s): INR, PROTIME in the last 168 hours. Cardiac Enzymes: No results for input(s): CKTOTAL, CKMB, CKMBINDEX, TROPONINI in the last 168 hours. BNP (last 3 results) No results for input(s): PROBNP in the last 8760 hours. HbA1C: No results for input(s): HGBA1C in the last 72 hours. CBG: Recent Labs  Lab 12/14/21 1541 12/14/21 2111 12/14/21 2231 12/15/21 0730 12/15/21 1121  GLUCAP 246* 233* 255* 163* 190*   Lipid Profile: No results for input(s): CHOL, HDL, LDLCALC, TRIG, CHOLHDL, LDLDIRECT in the last 72 hours. Thyroid Function Tests: No results for input(s): TSH, T4TOTAL, FREET4, T3FREE, THYROIDAB in the last 72 hours. Anemia Panel: No results for input(s): VITAMINB12, FOLATE, FERRITIN, TIBC, IRON, RETICCTPCT in the last 72 hours. Urine analysis:    Component Value Date/Time   COLORURINE YELLOW 12/07/2021 1211   APPEARANCEUR CLEAR 12/07/2021 1211   LABSPEC 1.020 12/07/2021 1211   PHURINE 6.0 12/07/2021 1211   GLUCOSEU >=500 (A) 12/07/2021 1211   HGBUR NEGATIVE 12/07/2021 1211   BILIRUBINUR NEGATIVE 12/07/2021 1211   KETONESUR NEGATIVE 12/07/2021 1211   PROTEINUR >300 (A) 12/07/2021 1211   NITRITE NEGATIVE 12/07/2021 1211   LEUKOCYTESUR NEGATIVE 12/07/2021 1211   Recent Results (from the past 240  hour(s))  Resp Panel by RT-PCR (Flu A&B, Covid) Nasopharyngeal Swab     Status: None   Collection Time: 12/06/21  9:23 PM   Specimen: Nasopharyngeal Swab; Nasopharyngeal(NP) swabs in vial transport medium  Result Value Ref Range Status   SARS Coronavirus 2 by RT PCR NEGATIVE NEGATIVE Final    Comment: (NOTE) SARS-CoV-2 target nucleic acids are NOT DETECTED.  The SARS-CoV-2 RNA is generally detectable in upper respiratory specimens during the acute phase of infection. The lowest concentration of SARS-CoV-2 viral copies this assay can detect is 138 copies/mL. A negative result does not preclude SARS-Cov-2 infection and should not be used as the sole basis for treatment or other patient management decisions. A negative result may occur with  improper specimen collection/handling, submission of specimen other than nasopharyngeal swab, presence of viral mutation(s) within the areas targeted by this assay, and inadequate number of viral copies(<138 copies/mL). A negative result must be combined with clinical observations, patient history, and epidemiological information. The expected result is Negative.  Fact Sheet for Patients:  EntrepreneurPulse.com.au  Fact Sheet for Healthcare  Providers:  IncredibleEmployment.be  This test is no t yet approved or cleared by the [place] and  has been authorized for detection and/or diagnosis of SARS-CoV-2 by FDA under an Emergency Use Authorization (EUA). This EUA will remain  in effect (meaning this test can be used) for the duration of the COVID-19 declaration under Section 564(b)(1) of the Act, 21 U.S.C.section 360bbb-3(b)(1), unless the authorization is terminated  or revoked sooner.       Influenza A by PCR NEGATIVE NEGATIVE Final   Influenza B by PCR NEGATIVE NEGATIVE Final    Comment: (NOTE) The Xpert Xpress SARS-CoV-2/FLU/RSV plus assay is intended as an aid in the diagnosis of influenza from  Nasopharyngeal swab specimens and should not be used as a sole basis for treatment. Nasal washings and aspirates are unacceptable for Xpert Xpress SARS-CoV-2/FLU/RSV testing.  Fact Sheet for Patients: EntrepreneurPulse.com.au  Fact Sheet for Healthcare Providers: IncredibleEmployment.be  This test is not yet approved or cleared by the [place] FDA and has been authorized for detection and/or diagnosis of SARS-CoV-2 by FDA under an Emergency Use Authorization (EUA). This EUA will remain in effect (meaning this test can be used) for the duration of the COVID-19 declaration under Section 564(b)(1) of the Act, 21 U.S.C. section 360bbb-3(b)(1), unless the authorization is terminated or revoked.  Performed at [facility], [place] [address], [place], [place] [address]   Blood Culture (routine x 2)     Status: None   Collection Time: 12/06/21  9:23 PM   Specimen: BLOOD RIGHT HAND  Result Value Ref Range Status   Specimen Description BLOOD RIGHT HAND  Final   Special Requests   Final    BOTTLES DRAWN AEROBIC AND ANAEROBIC Blood Culture adequate volume   Culture   Final    NO GROWTH 5 DAYS Performed at [facility], [place] [address], [place], [place] [address]    Report Status 12/11/2021 FINAL  Final  Blood Culture (routine x 2)     Status: None   Collection Time: 12/06/21  9:40 PM   Specimen: BLOOD LEFT FOREARM  Result Value Ref Range Status   Specimen Description BLOOD LEFT FOREARM  Final   Special Requests   Final    BOTTLES DRAWN AEROBIC AND ANAEROBIC Blood Culture adequate volume   Culture   Final    NO GROWTH 5 DAYS Performed at [facility], [place] [address] [place], [place] [address]    Report Status 12/11/2021 FINAL  Final  Culture, Respiratory w Gram Stain     Status: None   Collection Time: 12/06/21 10:30 PM   Specimen: Tracheal Aspirate  Result Value Ref Range Status   Specimen Description TRACHEAL ASPIRATE   Final   Special Requests NONE  Final   Gram Stain   Final    MODERATE WBC PRESENT, PREDOMINANTLY MONONUCLEAR FEW GRAM POSITIVE COCCI IN PAIRS RARE GRAM POSITIVE RODS    Culture   Final    Normal respiratory flora-no Staph aureus or Pseudomonas seen Performed at [facility], [place] [address] [place], [place] [address]    Report Status 12/09/2021 FINAL  Final  Urine Culture     Status: Abnormal   Collection Time: 12/07/21 12:11 PM   Specimen: In/Out Cath Urine  Result Value Ref Range Status   Specimen Description IN/OUT CATH URINE  Final   Special Requests   Final    NONE Performed at [facility], [place] [address],  [place], [place] [address]    Culture (A)  Final    10,000 COLONIES/mL MULTIPLE SPECIES PRESENT, SUGGEST RECOLLECTION   Report Status 12/08/2021 FINAL  Final  Culture, Respiratory w Gram Stain     Status: None   Collection Time: 12/10/21  1:49 PM   Specimen: Bronchoalveolar Lavage; Respiratory  Result Value Ref Range Status   Specimen Description BRONCHIAL ALVEOLAR LAVAGE  Final   Special Requests LLL  Final   Gram Stain   Final    RARE WBC PRESENT,BOTH PMN AND MONONUCLEAR NO ORGANISMS SEEN    Culture   Final    NO GROWTH 2 DAYS Performed at [facility], [address] [place], [place] [address]    Report Status 12/12/2021 FINAL  Final      Radiology Studies: No results found.  Scheduled Meds:  amLODipine  10 mg Oral Daily   aspirin EC  81 mg Oral Daily   atorvastatin  20 mg Oral Daily   budesonide  0.25 mg Nebulization BID   carvedilol  37.5 mg Oral BID   Chlorhexidine Gluconate Cloth  6 each Topical Daily   clonazepam  0.25 mg Oral QHS   DULoxetine  60 mg Oral Daily   enoxaparin (LOVENOX) injection  40 mg Subcutaneous Q24H   fluticasone  2 spray Each Nare Daily   gabapentin  100 mg Oral BID   guaiFENesin  1,200 mg Oral BID   hydrALAZINE  100 mg Oral TID   insulin aspart  0-5 Units Subcutaneous QHS   insulin aspart  0-9 Units  Subcutaneous TID WC   insulin glargine-yfgn  20 Units Subcutaneous Daily   loratadine  10 mg Oral Daily   losartan  50 mg Oral Daily   pantoprazole  40 mg Oral BID   senna  2 tablet Oral Daily   traZODone  50 mg Oral QHS   Continuous Infusions:   LOS: 8 days   Time spent: 25 minutes.  Patrecia Pour, MD [facility] [email or web address] 12/15/2021, 2:28 PM

## 2021-12-15 NOTE — Progress Notes (Signed)
RT placed patient on room air around 1215. Patient fell asleep and oxygen saturations dropped. Patient had to be switched back over to oxygen around 1420 in order to maintain appropriate oxygen saturations while asleep. RT will keep patient on oxygen trach collar.

## 2021-12-15 NOTE — Telephone Encounter (Signed)
Gorden Harms, RN with [facility] Ph # [phone]  Amy called stating pt is expected to discharge from hospital 12/16/21 and has been on [place] for insulin. She said pt cannot afford until 12/28/2021 due to payment. She is asking if pt can wait from 1/5 until 1/17 without the medication.

## 2021-12-15 NOTE — H&P (View-Only) (Signed)
PCCM Brief Progress Note  12/15/2021  S:  Eating breakfast this morning. Still has trouble with phonation despite cuff being down, even with finger occlusion of trach.   O: Blood pressure (!) 143/76, pulse 80, temperature 98.5 F (36.9 C), temperature source Oral, resp. rate 18, SpO2 94 %.   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

## 2021-12-15 NOTE — TOC Progression Note (Signed)
Transition of Care [facility]) - Progression Note    Patient Details  Name: Maureen Jones MRN: [id number] Date of Birth: Feb 26, 1966  [facility]) CM/SW Contact  Graves-Bigelow, Ocie Cornfield, RN Phone Number: 12/15/2021, 1:07 PM  Clinical Narrative:  Case Manager discussed POC needs for the patient with [employer] Liaison. Durable Medical Equipment will be delivered to the home to provide education to the caregiver. Patient will have portable tanks for transport home. CSW will assist with transportation home on Friday.Plan for trach exchange for proximal 6 XLT cuffless tomorrow. Case Manager will continue to follow for additional transition of care needs.   Expected Discharge Plan: [place] Barriers to Discharge: Continued Medical Work up  Expected Discharge Plan and Services Expected Discharge Plan: [place] In-house Referral: NA Discharge Planning Services: CM Consult Post Acute Care Choice: Home Health, Resumption of Svcs/PTA Provider Living arrangements for the past 2 months: Mobile Home Expected Discharge Date: 12/15/21               DME Arranged: Ventilator, Trach supplies (trach humidification mask-[employer] to supply) DME Agency: [facility] Date DME Agency Contacted: 12/13/21 Time DME Agency Contacted: 74 Representative spoke with at DME Agency: Thedore Mins HH Arranged: PT, OT, Speech Therapy HH Agency: [facility] Date [place]: 12/13/21 Time [place]: 1200 Representative spoke with at [place] (Left vociemail)   Social Determinants of Health ([place]) Interventions    Readmission Risk Interventions Readmission Risk Prevention Plan 12/13/2021 11/15/2021  Transportation Screening Complete Complete  PCP or Specialist Appt within 3-5 Days Complete Complete  HRI or [place] Complete Complete  Social Work Consult for [place] Planning/Counseling Complete Patient refused  Palliative Care Screening  Not Applicable Not Applicable  Medication Review Press photographer) Complete Complete

## 2021-12-15 NOTE — Progress Notes (Signed)
Occupational Therapy Treatment Patient Details Name: Maureen Jones MRN: [id number] DOB: Feb 26, 1966 Today's Date: 12/15/2021   History of present illness 56 y/o female presented to ED on 12/26 for SOB and nausea. Chest x-ray shows infiltrates concerning for aspiration. Found to have blood clot completing occluding airway. Trach changed on 12/28. Pt again with occlusion of trach on 12/30 and moved to ICU and trach upsized. PMH: HTN, DM, trach.   OT comments  Patient continues to make steady progress towards goals in skilled OT session. Patient's session encompassed functional mobility to improve activity tolerance for safe discharge home. Patient is mod I with basic ADLs, and was able to ambulate the entire hallway with one standing rest break due to RR being up to 31 (recovered to 18 with excellent pursed lip breathing in less than one minute). Continue with current discharge recommendations, therapy will continue to follow.    Recommendations for follow up therapy are one component of a multi-disciplinary discharge planning process, led by the attending physician.  Recommendations may be updated based on patient status, additional functional criteria and insurance authorization.    Follow Up Recommendations  No OT follow up    Assistance Recommended at Discharge Intermittent Supervision/Assistance  Patient can return home with the following      Equipment Recommendations  None recommended by OT    Recommendations for Other Services      Precautions / Restrictions Precautions Precautions: Other (comment) Restrictions Weight Bearing Restrictions: No       Mobility Bed Mobility Overal bed mobility: Modified Independent Bed Mobility: Supine to Sit     Supine to sit: Modified independent (Device/Increase time)          Transfers Overall transfer level: Modified independent Equipment used: None Transfers: Sit to/from Stand Sit to Stand: Modified independent  (Device/Increase time)           General transfer comment: supervision for safety and lines     Balance Overall balance assessment: No apparent balance deficits (not formally assessed) Sitting-balance support: No upper extremity supported;Feet supported Sitting balance-[name]: Good     Standing balance support: No upper extremity supported;During functional activity Standing balance-[name]: Good                             ADL either performed or assessed with clinical judgement   ADL Overall ADL's : Needs assistance/impaired                 Upper Body Dressing : Supervision/safety;Standing Upper Body Dressing Details (indicate cue type and reason): to change gown     Toilet Transfer: Supervision/safety;Ambulation Toilet Transfer Details (indicate cue type and reason): simulated to recliner after ambulation in hallway         Functional mobility during ADLs: Supervision/safety General ADL Comments: session focus on increasing activity tolerance in order for safe discharge home    Extremity/Trunk Assessment              Vision       Perception     Praxis      Cognition Arousal/Alertness: Awake/alert Behavior During Therapy: WFL for tasks assessed/performed Overall Cognitive Status: Within Functional Limits for tasks assessed                                            Exercises  Shoulder Instructions       General Comments      Pertinent Vitals/ Pain       Pain Assessment: No/denies pain  Home Living                                          Prior Functioning/Environment              Frequency  Min 2X/week        Progress Toward Goals  OT Goals(current goals can now be found in the care plan section)  Progress towards OT goals: Progressing toward goals  Acute Rehab OT Goals Patient Stated Goal: none stated OT Goal Formulation: With patient Time For Goal  Achievement: 12/23/21 Potential to Achieve Goals: Good  Plan Frequency remains appropriate;Discharge plan remains appropriate    Co-evaluation                 AM-PAC OT "6 Clicks" Daily Activity     Outcome Measure   Help from another person eating meals?: None Help from another person taking care of personal grooming?: A Little Help from another person toileting, which includes using toliet, bedpan, or urinal?: A Little Help from another person bathing (including washing, rinsing, drying)?: A Little Help from another person to put on and taking off regular upper body clothing?: None Help from another person to put on and taking off regular lower body clothing?: A Little 6 Click Score: 20    End of Session Equipment Utilized During Treatment: Oxygen (5L 28%)  OT Visit Diagnosis: Other abnormalities of gait and mobility (R26.89)   Activity Tolerance Patient tolerated treatment well   Patient Left in chair;with call bell/phone within reach;with nursing/sitter in room   Nurse Communication Mobility status        Time: 8466-5993 OT Time Calculation (min): 42 min  Charges: OT General Charges $OT Visit: 1 Visit OT Treatments $Self Care/Home Management : 38-52 mins  Corinne Ports E. [place], [place] [facility] [place] 12/15/2021, 1:41 PM

## 2021-12-15 NOTE — Progress Notes (Signed)
Physical Therapy Treatment Patient Details Name: Maureen Jones MRN: [id number] DOB: [birth year] Today's Date: 12/15/2021   History of Present Illness 56 y/o female presented to ED on 12/26 for SOB and nausea. Chest x-ray shows infiltrates concerning for aspiration. Found to have blood clot completing occluding airway. Trach changed on 12/28. Pt again with occlusion of trach on 12/30 and moved to ICU and trach upsized. PMH: HTN, DM, trach.    PT Comments    Pt continues to display good balance with gait, even when negotiating obstacles or challenging her vestibular system. She does display some lower extremity weakness, impacting the ease at which she navigates stairs though. In addition, she continues to display deficits in aerobic endurance, requiring supplemental O2 via trach collar for mobility. Will continue to follow acutely, but hopefully will progress to not needing acute PT soon.    Recommendations for follow up therapy are one component of a multi-disciplinary discharge planning process, led by the attending physician.  Recommendations may be updated based on patient status, additional functional criteria and insurance authorization.  Follow Up Recommendations  No PT follow up     Assistance Recommended at Discharge Intermittent Supervision/Assistance  Patient can return home with the following     Equipment Recommendations  None recommended by PT    Recommendations for Other Services       Precautions / Restrictions Precautions Precautions: Other (comment) Precaution Comments: trach Restrictions Weight Bearing Restrictions: No     Mobility  Bed Mobility Overal bed mobility: Modified Independent Bed Mobility: Sit to Supine     Supine to sit: Modified independent (Device/Increase time) Sit to supine: Modified independent (Device/Increase time);HOB elevated   General bed mobility comments: Pt able to sit and pivot on bed into long-sitting position while HOB was  elevated up to her, mod I.    Transfers Overall transfer level: Modified independent Equipment used: None Transfers: Sit to/from Stand Sit to Stand: Modified independent (Device/Increase time)           General transfer comment: Able to come to stand safely pushing up from chair without LOB.    Ambulation/Gait Ambulation/Gait assistance: Modified independent (Device/Increase time) Gait Distance (Feet): 300 Feet Assistive device: None Gait Pattern/deviations: WFL(Within Functional Limits) Gait velocity: decr Gait velocity interpretation: >2.62 ft/sec, indicative of community ambulatory   General Gait Details: Pt with steady gait, even when changing head positions, speeds, and directions, no LOB.   Stairs Stairs: Yes Stairs assistance: Min guard Stair Management: One rail Left;One rail Right;Step to pattern;Forwards Number of Stairs: 4 General stair comments: Ascends with L rail and descends with R to simulate home set-up. Pt with slow, step-to pattern, no LOB, but extra effort ascending. Min guard for safety.   Wheelchair Mobility    Modified Rankin (Stroke Patients Only)       Balance Overall balance assessment: No apparent balance deficits (not formally assessed) Sitting-balance support: No upper extremity supported;Feet supported Sitting balance-[name]: Good     Standing balance support: No upper extremity supported;During functional activity Standing balance-[name]: Good                              Cognition Arousal/Alertness: Awake/alert Behavior During Therapy: WFL for tasks assessed/performed Overall Cognitive Status: Within Functional Limits for tasks assessed  Exercises      General Comments General comments (skin integrity, edema, etc.): VSS on 4L 30% trach collar, attempted on RA standing statically but pt requesting supplemental O2 due to increased work of  breathing even though SpO2 reading 96%; educated pt on exercises to improve her lower extremity strength      Pertinent Vitals/Pain Pain Assessment: No/denies pain    Home Living                          Prior Function            PT Goals (current goals can now be found in the care plan section) Acute Rehab PT Goals Patient Stated Goal: to get better PT Goal Formulation: With patient Time For Goal Achievement: 12/22/21 Potential to Achieve Goals: Good Progress towards PT goals: Progressing toward goals    Frequency    Min 3X/week      PT Plan Current plan remains appropriate    Co-evaluation              AM-PAC PT "6 Clicks" Mobility   Outcome Measure  Help needed turning from your back to your side while in a flat bed without using bedrails?: None Help needed moving from lying on your back to sitting on the side of a flat bed without using bedrails?: None Help needed moving to and from a bed to a chair (including a wheelchair)?: None Help needed standing up from a chair using your arms (e.g., wheelchair or bedside chair)?: None Help needed to walk in hospital room?: None Help needed climbing 3-5 steps with a railing? : A Little 6 Click Score: 23    End of Session Equipment Utilized During Treatment: Oxygen Activity Tolerance: Patient tolerated treatment well Patient left: in bed;with call bell/phone within reach Nurse Communication: Mobility status PT Visit Diagnosis: Other abnormalities of gait and mobility (R26.89);Difficulty in walking, not elsewhere classified (R26.2);Muscle weakness (generalized) (M62.81)     Time: 7026-3785 PT Time Calculation (min) (ACUTE ONLY): 28 min  Charges:  $Gait Training: 23-37 mins                     Moishe Spice, PT, DPT Acute Rehabilitation Services  Pager: [phone] Office: [place] 12/15/2021, 4:51 PM

## 2021-12-15 NOTE — Progress Notes (Signed)
PCCM Brief Progress Note  12/15/2021  S:  Eating breakfast this morning. Still has trouble with phonation despite cuff being down, even with finger occlusion of trach.   O: Blood pressure (!) 143/76, pulse 80, temperature 98.5 F (36.9 C), temperature source Oral, resp. rate 18, SpO2 94 %.   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

## 2021-12-15 NOTE — Plan of Care (Signed)

## 2021-12-16 ENCOUNTER — Encounter (HOSPITAL_COMMUNITY): Payer: Self-pay | Admitting: Internal Medicine

## 2021-12-16 ENCOUNTER — Inpatient Hospital Stay (HOSPITAL_COMMUNITY): Payer: Medicare Other | Admitting: Anesthesiology

## 2021-12-16 ENCOUNTER — Inpatient Hospital Stay (HOSPITAL_COMMUNITY): Payer: Medicare Other

## 2021-12-16 ENCOUNTER — Encounter (HOSPITAL_COMMUNITY)
Admission: EM | Disposition: A | Discharge status: Home / Self Care | Payer: Self-pay | Source: Home / Self Care | Attending: Internal Medicine

## 2021-12-16 DIAGNOSIS — I10 Essential (primary) hypertension: Secondary | ICD-10-CM | POA: Diagnosis not present

## 2021-12-16 DIAGNOSIS — E1169 Type 2 diabetes mellitus with other specified complication: Secondary | ICD-10-CM | POA: Diagnosis not present

## 2021-12-16 DIAGNOSIS — N184 Chronic kidney disease, stage 4 (severe): Secondary | ICD-10-CM | POA: Diagnosis not present

## 2021-12-16 DIAGNOSIS — J9601 Acute respiratory failure with hypoxia: Secondary | ICD-10-CM | POA: Diagnosis not present

## 2021-12-16 HISTORY — PX: TRACHEOSTOMY REVISION: SHX6133

## 2021-12-16 LAB — GLUCOSE, CAPILLARY
Glucose-Capillary: 166 mg/dL — ABNORMAL HIGH (ref 70–99)
Glucose-Capillary: 192 mg/dL — ABNORMAL HIGH (ref 70–99)
Glucose-Capillary: 179 mg/dL — ABNORMAL HIGH (ref 70–99)
Glucose-Capillary: 242 mg/dL — ABNORMAL HIGH (ref 70–99)
Glucose-Capillary: 229 mg/dL — ABNORMAL HIGH (ref 70–99)

## 2021-12-16 SURGERY — REVISION, STOMA, TRACHEA
Anesthesia: General

## 2021-12-16 MED ORDER — LABETALOL HCL 5 MG/ML IV SOLN
INTRAVENOUS | Status: AC
  Administered 2021-12-16: 10 mg via INTRAVENOUS
  Filled 2021-12-16: qty 4

## 2021-12-16 MED ORDER — LIDOCAINE HCL 2 % IJ SOLN
INTRAMUSCULAR | Status: AC
  Filled 2021-12-16: qty 20

## 2021-12-16 MED ORDER — LACTATED RINGERS IV SOLN: INTRAVENOUS | Status: DC

## 2021-12-16 MED ORDER — LABETALOL HCL 5 MG/ML IV SOLN: 10.0000 mg | Freq: Once | INTRAVENOUS | Status: AC

## 2021-12-16 NOTE — Progress Notes (Signed)
Inpatient Diabetes Program Recommendations  AACE/ADA: New Consensus Statement on Inpatient Glycemic Control (2015)  Target Ranges:  Prepandial:   less than 140 mg/dL      Peak postprandial:   less than 180 mg/dL (1-2 hours)      Critically ill patients:  140 - 180 mg/dL   Lab Results  Component Value Date   GLUCAP 192 (H) 12/16/2021   HGBA1C 7.8 (H) 12/15/2021   Called [facility] to inquire about Tresiba insulin.   Pt did not pick up refill on Tresiba during the 14 day window so the pharmacy sent it back to insurance. Pharmacy said they could fill it today and pt could pick up today. Informed them pt will be d/c'd tomorrow.  Unsure of copay at this time.  Thanks,  Tama Headings RN, MSN, BC-ADM Inpatient Diabetes Coordinator Team Pager [phone] (8a-5p)

## 2021-12-16 NOTE — Anesthesia Preprocedure Evaluation (Addendum)
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   breath sounds clear to auscultation       Cardiovascular hypertension,  Rhythm:regular Rate:Normal     Neuro/Psych  Neuromuscular disease    GI/Hepatic   Endo/Other  diabetes, Type 2  Renal/GU Renal InsufficiencyRenal disease     Musculoskeletal   Abdominal   Peds  Hematology  (+) anemia ,   Anesthesia Other Findings   Reproductive/Obstetrics                           Anesthesia Physical Anesthesia Plan  ASA: 3  Anesthesia Plan: MAC   Post-op Pain Management:    Induction:   PONV Risk Score and Plan: 3 and Treatment may vary due to age or medical condition  Airway Management Planned: Tracheostomy  Additional Equipment:   Intra-op Plan:   Post-operative Plan:   Informed Consent: I have reviewed the patients History and Physical, chart, labs and discussed the procedure including the risks, benefits and alternatives for the proposed anesthesia with the patient or authorized representative who has indicated his/her understanding and acceptance.     Dental advisory given  Plan Discussed with: CRNA, Anesthesiologist and Surgeon  Anesthesia Plan Comments:        Anesthesia Quick Evaluation

## 2021-12-16 NOTE — Care Management (Addendum)
1512 12-16-21 Case Manager called Gorden Harms RN Case Manager with [facility] earlier this am regarding Tyler Aas- received call back from Amy. Amy stated that the patient is not able to pick the Tresiba medication up from [facility] [place]-until Jan 17 due to cost. [facility] and [facility] cost is $35.00. Case Manager called the caregiver Cleotis Nipper and she stated that another insulin is greater than $300.00. Case Manager will speak with patient to clarify. Case Manager also spoke with the Diabetes Educator.   1535 12-16-21 [employer] sent a message to the Case Manager regarding oxygen for the patient.: E-tank at 3 Liters will last 3 hours and 47 minutes. A (D)-tank which is the portable she has at home that is half the size of the E tanks last 2 hours and 18 minutes. The carry on luggage size for TOC lasts 30 minutes unplugged. Case Manager will relay the information with the provider. Case Manager will continue to follow.   1553 12-16-21 Case Manager spoke with Liaison with [employer] and the [facility] will not service the patient in the home setting and when she goes to appointments. [employer] is working with family regarding oxygen needs.   1615 12-16-21 [employer] spoke with Cleotis Nipper, the caregiver for the patient. Patient has E tanks in the home with a 10 liter concentrator. [employer] will remove the 10 liter concentrator from the home and the E tanks. D tanks will be delivered to the home along with a 5 liter concentrator with a home fill station to fill the D tanks when needed. Patient will have a spare E tank for travel as well, since the D tanks will only last 2 hours 18 minutes at 3 Liters.   1633 12-16-21 Additional supplies ordered for the patient- suction, trach care kits, and one Shiley # 6  cuffless proximal XLT. Additional supplies have been ordered via [employer]. No further needs at this time.

## 2021-12-16 NOTE — Plan of Care (Signed)

## 2021-12-16 NOTE — Interval H&P Note (Signed)
History and Physical Interval Note:  12/16/2021 12:24 PM  Maureen Jones  has presented today for surgery, with the diagnosis of trach exchange.  The various methods of treatment have been discussed with the patient and family. After consideration of risks, benefits and other options for treatment, the patient has consented to  Procedure(s): VIDEO BRONCHOSCOPY WITHOUT FLUORO (N/A) as a surgical intervention.  The patient's history has been reviewed, patient examined, no change in status, stable for surgery.  I have reviewed the patient's chart and labs.  Questions were answered to the patient's satisfaction.     Maryjane Hurter

## 2021-12-16 NOTE — Telephone Encounter (Signed)
Spoke with Amy and she states that she has tried all options to help with no success and unfortunately she can not get this Rx. Just wanted to make you aware

## 2021-12-16 NOTE — Progress Notes (Signed)
SLP Cancellation Note  Patient Details Name: Maureen Jones MRN: [id number] DOB: May 12, 1966   Cancelled treatment:       Reason Eval/Treat Not Completed: Patient at procedure or test/unavailable. SLP has been following pt via chart and speaking with care team throughout the weak regarding change to cuffless trach. She appears to be off the floor for this exchange. Will f/u as able post-procedure to assess for potential to wear PMV again.     Osie Bond., M.A. [place] Acute Rehabilitation Services Pager [phone] Office [phone]  12/16/2021, 1:08 PM

## 2021-12-16 NOTE — Transfer of Care (Signed)
Immediate Anesthesia Transfer of Care Note  Patient: Maureen Jones  Procedure(s) Performed: VIDEO BRONCHOSCOPY WITHOUT FLUORO  Patient Location: Endoscopy Unit  Anesthesia Type:MAC  Level of Consciousness: awake, alert  and oriented  Airway & Oxygen Therapy: Patient Spontanous Breathing and Patient connected to face mask oxygen  Post-op Assessment: Report given to RN and Post -op Vital signs reviewed and stable  Post vital signs: Reviewed and stable  Last Vitals:  Vitals Value Taken Time  BP    Temp    Pulse    Resp    SpO2      Last Pain:  Vitals:   12/16/21 1121  TempSrc: Oral  PainSc:       Patients Stated Pain Goal: 0 (46/04/79 [id number])  Complications: No notable events documented.

## 2021-12-16 NOTE — Progress Notes (Signed)
PROGRESS NOTE  Maureen Jones  OFH:[id number] DOB: Dec 28, 1965 DOA: 12/06/2021 PCP: Flossie Buffy, NP   Brief Narrative: Maureen Jones is a 56 y.o. female with history of recent admission for respiratory failure in the setting of hypertensive emergency and hypoxia due to vocal cord dysfunction and supraglottic edema and asthma exacerbation was initially intubated and was on ventilator and subsequently had to have a tracheostomy was discharged on November 15, 2021.  She has been out of her suction supplies for about 1 week. Continues to have issues with plugging.  S/p trach change per PCCM. Adequate education and supplies have been provided. The plan is to exchange for cuffless trach in endoscopy 12/16/2021. If stable, can discharge home 1/6.  Assessment & Plan: Principal Problem:   Acute respiratory failure with hypoxia (HCC) Active Problems:   Tracheostomy tube present (HCC)   Essential hypertension   OSA (obstructive sleep apnea)   DM (diabetes mellitus) (HCC)   CKD (chronic kidney disease) stage 4, GFR 15-29 ml/min (HCC)   Pneumonia   Acute respiratory failure with hypoxemia (HCC)  Acute respiratory failure with hypoxia with severe tracheobronchomalacia and suspected aspiration pneumonia: Chest x-ray did show infiltrates concerning for pneumonia so was treated with antibiotics x 5 days -nebulizer and frequent suctioning -mucinex -PCCM consult appreciated: Needs home trach humidification prior to DC which has been arranged. Planning to exchange for cuffless trach 1/5 and watch overnight. - CM working diligently to arrange home care needs. - D/w RN to wean oxygen as tolerated. Home oxygen has been arranged, though her requirement now is not known.   Hypertension  - Continue amlodipine, coreg, and hydralazine    Diabetes mellitus type 2 with hyperglycemia: HbA1c showing improvement from 12.7% to 7.8%.  - D/w CM who will investigate how to make sure pt can access tresiba.  -  Continue glargine + aspart SSI.   Chronic kidney disease stage IV: Near baseline. - Avoid nephrotoxins.   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  - Monitor intermittently.    Asthma  - continue with nebulizer. - added claritin   Hypomagnesemia - repleted  Obesity: Estimated body mass index is 37.41 kg/m as calculated from the following:   Height as of this encounter: _0  (1.676 m).   Weight as of 11/22/21: 105.1 kg.  DVT prophylaxis: Lovenox Code Status: Partial - no compressions or defibrillation Family Communication: None at bedside Disposition Plan:  Status is: Inpatient  Remains inpatient appropriate because: Needs exchange to cuffless tracheostomy which is high risk in this patient, requiring endoscopy suite.  Consultants:  PCCM  Subjective: No new complaints. No dyspnea or chest pain.  Objective: Vitals:   12/16/21 1123 12/16/21 1139 12/16/21 1401 12/16/21 1545  BP: (!) 201/82 (!) 171/90 (!) 175/87   Pulse:   86   Resp:   16 18  Temp:   98.3 F (36.8 C)   TempSrc:   Oral   SpO2:   100%   Height:        Intake/Output Summary (Last 24 hours) at 12/16/2021 1603 Last data filed at 12/15/2021 2200 Gross per 24 hour  Intake 480 ml  Output --  Net 480 ml   There were no vitals filed for this visit.  Gen: 56 y.o. female in no distress Pulm: Nonlabored on supplemental oxygen, SpO2 100%. CV: Regular rate and rhythm. No dependent edema.  Data Reviewed: I have personally reviewed following labs and imaging studies  CBC: Recent Labs  Lab 12/11/21 1310 12/12/21 0040 12/13/21 0103  12/14/21 0600 12/15/21 0307  WBC 10.2 8.6 8.0 9.4 9.2  HGB 9.4* 10.0* 9.1* 8.9* 8.9*  HCT 31.0* 32.0* 30.6* 28.3* 28.4*  MCV 93.9 93.3 94.4 92.8 93.4  PLT 216 222 216 212 010   Basic Metabolic Panel: Recent Labs  Lab 12/11/21 1310 12/12/21 0040 12/13/21 0103 12/14/21 0600 12/15/21 0307  NA 139 139 140 139 138  K 4.1 4.7 4.1 4.0 4.3  CL 106 108 106 106 107  CO2 _0 GLUCOSE 163* 225* 235* 182* 216*  BUN 21* 25* 25* 27* 36*  CREATININE 1.82* 1.88* 2.20* 1.94* 2.04*  CALCIUM 9.5 9.8 9.9 9.5 9.7  MG  --   --   --  1.8  --    GFR: CrCl cannot be calculated (Unknown ideal weight.). Liver Function Tests: No results for input(s): AST, ALT, ALKPHOS, BILITOT, PROT, ALBUMIN in the last 168 hours. No results for input(s): LIPASE, AMYLASE in the last 168 hours. No results for input(s): AMMONIA in the last 168 hours. Coagulation Profile: No results for input(s): INR, PROTIME in the last 168 hours. Cardiac Enzymes: No results for input(s): CKTOTAL, CKMB, CKMBINDEX, TROPONINI in the last 168 hours. BNP (last 3 results) No results for input(s): PROBNP in the last 8760 hours. HbA1C: Recent Labs    12/15/21 1440  HGBA1C 7.8*   CBG: Recent Labs  Lab 12/15/21 1701 12/15/21 2237 12/16/21 0733 12/16/21 1129 12/16/21 1416  GLUCAP 180* 180* 166* 192* 179*   Lipid Profile: No results for input(s): CHOL, HDL, LDLCALC, TRIG, CHOLHDL, LDLDIRECT in the last 72 hours. Thyroid Function Tests: No results for input(s): TSH, T4TOTAL, FREET4, T3FREE, THYROIDAB in the last 72 hours. Anemia Panel: No results for input(s): VITAMINB12, FOLATE, FERRITIN, TIBC, IRON, RETICCTPCT in the last 72 hours. Urine analysis:    Component Value Date/Time   COLORURINE YELLOW 12/07/2021 1211   APPEARANCEUR CLEAR 12/07/2021 1211   LABSPEC 1.020 12/07/2021 1211   PHURINE 6.0 12/07/2021 1211   GLUCOSEU >=500 (A) 12/07/2021 1211   HGBUR NEGATIVE 12/07/2021 1211   BILIRUBINUR NEGATIVE 12/07/2021 1211   KETONESUR NEGATIVE 12/07/2021 1211   PROTEINUR >300 (A) 12/07/2021 1211   NITRITE NEGATIVE 12/07/2021 1211   LEUKOCYTESUR NEGATIVE 12/07/2021 1211   Recent Results (from the past 240 hour(s))  Resp Panel by RT-PCR (Flu A&B, Covid) Nasopharyngeal Swab     Status: None   Collection Time: 12/06/21  9:23 PM   Specimen: Nasopharyngeal Swab; Nasopharyngeal(NP) swabs in vial transport  medium  Result Value Ref Range Status   SARS Coronavirus 2 by RT PCR NEGATIVE NEGATIVE Final    Comment: (NOTE) SARS-CoV-2 target nucleic acids are NOT DETECTED.  The SARS-CoV-2 RNA is generally detectable in upper respiratory specimens during the acute phase of infection. The lowest concentration of SARS-CoV-2 viral copies this assay can detect is 138 copies/mL. A negative result does not preclude SARS-Cov-2 infection and should not be used as the sole basis for treatment or other patient management decisions. A negative result may occur with  improper specimen collection/handling, submission of specimen other than nasopharyngeal swab, presence of viral mutation(s) within the areas targeted by this assay, and inadequate number of viral copies(<138 copies/mL). A negative result must be combined with clinical observations, patient history, and epidemiological information. The expected result is Negative.  Fact Sheet for Patients:  EntrepreneurPulse.com.au  Fact Sheet for Healthcare Providers:  IncredibleEmployment.be  This test is no t yet approved or cleared by the [place] and  has been authorized for detection and/or diagnosis of SARS-CoV-2 by FDA under an Emergency Use Authorization (EUA). This EUA will remain  in effect (meaning this test can be used) for the duration of the COVID-19 declaration under Section 564(b)(1) of the Act, 21 U.S.C.section 360bbb-3(b)(1), unless the authorization is terminated  or revoked sooner.       Influenza A by PCR NEGATIVE NEGATIVE Final   Influenza B by PCR NEGATIVE NEGATIVE Final    Comment: (NOTE) The Xpert Xpress SARS-CoV-2/FLU/RSV plus assay is intended as an aid in the diagnosis of influenza from Nasopharyngeal swab specimens and should not be used as a sole basis for treatment. Nasal washings and aspirates are unacceptable for Xpert Xpress SARS-CoV-2/FLU/RSV testing.  Fact Sheet for  Patients: EntrepreneurPulse.com.au  Fact Sheet for Healthcare Providers: IncredibleEmployment.be  This test is not yet approved or cleared by the [place] FDA and has been authorized for detection and/or diagnosis of SARS-CoV-2 by FDA under an Emergency Use Authorization (EUA). This EUA will remain in effect (meaning this test can be used) for the duration of the COVID-19 declaration under Section 564(b)(1) of the Act, 21 U.S.C. section 360bbb-3(b)(1), unless the authorization is terminated or revoked.  Performed at [facility], [place] [address] [place], [place] [address]   Blood Culture (routine x 2)     Status: None   Collection Time: 12/06/21  9:23 PM   Specimen: BLOOD RIGHT HAND  Result Value Ref Range Status   Specimen Description BLOOD RIGHT HAND  Final   Special Requests   Final    BOTTLES DRAWN AEROBIC AND ANAEROBIC Blood Culture adequate volume   Culture   Final    NO GROWTH 5 DAYS Performed at [facility], [place] [address], [place], [place] [address]    Report Status 12/11/2021 FINAL  Final  Blood Culture (routine x 2)     Status: None   Collection Time: 12/06/21  9:40 PM   Specimen: BLOOD LEFT FOREARM  Result Value Ref Range Status   Specimen Description BLOOD LEFT FOREARM  Final   Special Requests   Final    BOTTLES DRAWN AEROBIC AND ANAEROBIC Blood Culture adequate volume   Culture   Final    NO GROWTH 5 DAYS Performed at [facility], [place] [address] [place], [place] [address]    Report Status 12/11/2021 FINAL  Final  Culture, Respiratory w Gram Stain     Status: None   Collection Time: 12/06/21 10:30 PM   Specimen: Tracheal Aspirate  Result Value Ref Range Status   Specimen Description TRACHEAL ASPIRATE  Final   Special Requests NONE  Final   Gram Stain   Final    MODERATE WBC PRESENT, PREDOMINANTLY MONONUCLEAR FEW GRAM POSITIVE COCCI IN PAIRS RARE GRAM POSITIVE RODS    Culture   Final     Normal respiratory flora-no Staph aureus or Pseudomonas seen Performed at [facility], [place] [address] [place], [place] [address]    Report Status 12/09/2021 FINAL  Final  Urine Culture     Status: Abnormal   Collection Time: 12/07/21 12:11 PM   Specimen: In/Out Cath Urine  Result Value Ref Range Status   Specimen Description IN/OUT CATH URINE  Final   Special Requests   Final    NONE Performed at [facility], [place] [address], [place], [place] [address]    Culture (A)  Final    10,000 COLONIES/mL MULTIPLE SPECIES PRESENT, SUGGEST RECOLLECTION  Report Status 12/08/2021 FINAL  Final  Culture, Respiratory w Gram Stain     Status: None   Collection Time: 12/10/21  1:49 PM   Specimen: Bronchoalveolar Lavage; Respiratory  Result Value Ref Range Status   Specimen Description BRONCHIAL ALVEOLAR LAVAGE  Final   Special Requests LLL  Final   Gram Stain   Final    RARE WBC PRESENT,BOTH PMN AND MONONUCLEAR NO ORGANISMS SEEN    Culture   Final    NO GROWTH 2 DAYS Performed at [facility], [address] [place], [place] [address]    Report Status 12/12/2021 FINAL  Final      Radiology Studies: DG CHEST PORT 1 VIEW  Result Date: 12/16/2021 CLINICAL DATA:  Tracheostomy tube change EXAM: PORTABLE CHEST 1 VIEW COMPARISON:  12/08/2021 FINDINGS: Single frontal view of the chest demonstrates tracheostomy tube overlying tracheal air column tip just below thoracic inlet. The cardiac silhouette is stable. No airspace disease, effusion, or pneumothorax. No acute bony abnormality. IMPRESSION: 1. No acute intrathoracic process.  Unremarkable tracheostomy tube. Electronically Signed   By: Randa Ngo M.D.   On: 12/16/2021 15:34    Scheduled Meds:  amLODipine  10 mg Oral Daily   aspirin EC  81 mg Oral Daily   atorvastatin  20 mg Oral Daily   budesonide  0.25 mg Nebulization BID   carvedilol  37.5 mg Oral BID   clonazepam  0.25 mg Oral QHS   DULoxetine  60 mg Oral Daily    enoxaparin (LOVENOX) injection  40 mg Subcutaneous Q24H   fluticasone  2 spray Each Nare Daily   gabapentin  100 mg Oral BID   guaiFENesin  1,200 mg Oral BID   hydrALAZINE  100 mg Oral TID   insulin aspart  0-5 Units Subcutaneous QHS   insulin aspart  0-9 Units Subcutaneous TID WC   insulin glargine-yfgn  20 Units Subcutaneous Daily   loratadine  10 mg Oral Daily   losartan  50 mg Oral Daily   pantoprazole  40 mg Oral BID   senna  2 tablet Oral Daily   traZODone  50 mg Oral QHS   Continuous Infusions:   LOS: 9 days    Patrecia Pour, MD [facility] [email or web address] 12/16/2021, 4:03 PM

## 2021-12-16 NOTE — Op Note (Signed)
Tracheostomy Exchange Procedure Note  Maureen Jones  [id number]  1966/02/23  Date:12/16/21  Time:1:42 PM   Provider Performing:Brent Beauden [place]   Procedure: Tracheostomy Exchange Through Immature Stoma [phone])  Indication(s) Chronic respiratory failure with hypoxemia Tracheal stenosis  Consent Risks of the procedure as well as the alternatives and risks of each were explained to the patient and/or caregiver.  Consent for the procedure was obtained and is signed in the bedside chart  Anesthesia None   Time Out Verified patient identification, verified procedure, site/side was marked, verified correct patient position, special equipment/implants available, medications/allergies/relevant history reviewed, required imaging and test results available.   Sterile Technique Hand hygiene, gloves   Procedure Description Size 6 cuffed existing proximal XLT tracheostomy tube removed and size 6 uncuffed proximal XLT placed through stoma over a suction catheter.   Complications/Tolerance None; patient tolerated the procedure well..   EBL Minimal  Maureen Awkward, MD [place] [facility]M Pager: [phone] Cell: [phone] After 7:00 pm call Elink  [phone]

## 2021-12-16 NOTE — Anesthesia Postprocedure Evaluation (Signed)
Anesthesia Post Note  Patient: Maureen Jones  Procedure(s) Performed: TRACHEOSTOMY EXCHANGE     Patient location during evaluation: Endoscopy Anesthesia Type: General Level of consciousness: awake and alert Pain management: pain level controlled Vital Signs Assessment: post-procedure vital signs reviewed and stable Respiratory status: spontaneous breathing, nonlabored ventilation, respiratory function stable and patient connected to tracheostomy mask oxygen Cardiovascular status: stable and blood pressure returned to baseline Postop Assessment: no apparent nausea or vomiting Anesthetic complications: no   No notable events documented.  Last Vitals:  Vitals:   12/16/21 1139 12/16/21 1401  BP: (!) 171/90 (!) 175/87  Pulse:  86  Resp:  16  Temp:  36.8 C  SpO2:  100%    Last Pain:  Vitals:   12/16/21 1401  TempSrc: Oral  PainSc: 0-No pain                 Thecla Forgione,W. EDMOND

## 2021-12-16 NOTE — Progress Notes (Addendum)
Patient off the floor. Will check patient when they return

## 2021-12-16 NOTE — Care Management Important Message (Signed)
Important Message  Patient Details  Name: Maureen Jones MRN: [id number] Date of Birth: 04-21-1966   Medicare Important Message Given:  Yes     Shelda Altes 12/16/2021, 7:27 AM

## 2021-12-16 NOTE — Anesthesia Procedure Notes (Signed)
Procedure Name: MAC Date/Time: 12/16/2021 1:29 PM Performed by: Imagene Riches, CRNA Pre-anesthesia Checklist: Patient identified, Suction available, Emergency Drugs available, Patient being monitored and Timeout performed Patient Re-evaluated:Patient Re-evaluated prior to induction Oxygen Delivery Method: Simple face mask

## 2021-12-16 NOTE — Progress Notes (Signed)
[facility] PCCM  Asked to replace tracheostomy tube due to staffing change Discussed situation with the patient and anesthesia and partners Will exchange today without moderate sedation  Roselie Awkward, MD [place] P[facility]M Pager: [phone] Cell: [phone] After 7:00 pm call Elink  [phone]

## 2021-12-17 ENCOUNTER — Other Ambulatory Visit (HOSPITAL_COMMUNITY): Payer: Self-pay

## 2021-12-17 DIAGNOSIS — J189 Pneumonia, unspecified organism: Secondary | ICD-10-CM

## 2021-12-17 DIAGNOSIS — N184 Chronic kidney disease, stage 4 (severe): Secondary | ICD-10-CM | POA: Diagnosis not present

## 2021-12-17 DIAGNOSIS — J9601 Acute respiratory failure with hypoxia: Secondary | ICD-10-CM | POA: Diagnosis not present

## 2021-12-17 DIAGNOSIS — I10 Essential (primary) hypertension: Secondary | ICD-10-CM | POA: Diagnosis not present

## 2021-12-17 DIAGNOSIS — E1169 Type 2 diabetes mellitus with other specified complication: Secondary | ICD-10-CM | POA: Diagnosis not present

## 2021-12-17 LAB — GLUCOSE, CAPILLARY
Glucose-Capillary: 229 mg/dL — ABNORMAL HIGH (ref 70–99)
Glucose-Capillary: 285 mg/dL — ABNORMAL HIGH (ref 70–99)

## 2021-12-17 MED ORDER — INSULIN LISPRO (1 UNIT DIAL) 100 UNIT/ML (KWIKPEN)
16.0000 [IU] | PEN_INJECTOR | Freq: Three times a day (TID) | SUBCUTANEOUS | 1 refills | Status: DC
  Filled 2021-12-17 – 2022-02-07 (×2): qty 15, 30d supply, fill #0

## 2021-12-17 MED ORDER — ASPIRIN EC 81 MG PO TBEC: 81.0000 mg | DELAYED_RELEASE_TABLET | Freq: Every day | ORAL | 0 refills | Status: AC

## 2021-12-17 NOTE — Discharge Summary (Signed)
Physician Discharge Summary  Maureen Jones IZT:[id number] DOB: [birth year] DOA: 12/06/2021  PCP: Flossie Buffy, NP  Admit date: 12/06/2021 Discharge date: 12/17/2021  Admitted From: Home Disposition: Home   Recommendations for Outpatient Follow-up:  Follow up with PCP in 1-2 weeks. Follow up in trach clinic in 2 weeks.  Home Health: Pt, OT, SLP Equipment/Devices: Oxygen tanks, humidification and trach supplies delivered to room Discharge Condition: Stable CODE STATUS: Partial In the event of cardiac or respiratory ARREST: Initiate Code Blue, Call Rapid Response Yes  In the event of cardiac or respiratory ARREST: Perform CPR No  In the event of cardiac or respiratory ARREST: Perform Intubation/Mechanical Ventilation Yes  In the event of cardiac or respiratory ARREST: Use NIPPV/BiPAp only if indicated Yes  In the event of cardiac or respiratory ARREST: Administer ACLS medications if indicated Yes  In the event of cardiac or respiratory ARREST: Perform Defibrillation or Cardioversion if indicated No  Diet recommendation: Carb-modified  Brief/Interim Summary: Maureen Jones is a 56 y.o. female with history of recent admission for respiratory failure in the setting of hypertensive emergency and hypoxia due to vocal cord dysfunction and supraglottic edema and asthma exacerbation was initially intubated and was on ventilator and subsequently had to have a tracheostomy was discharged on November 15, 2021.  She has been out of her suction supplies for about 1 week. Continues to have issues with plugging.  S/p trach change per PCCM. Adequate education and supplies have been provided. The patient underwent exchange for cuffless trach in endoscopy 12/16/2021 without apparent complications and is cleared for discharge per PCCM with plans to follow up in tracheostomy clinic in 2 weeks.  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 Acute insult suspected to be aspiration pneumonia: Chest x-ray did show infiltrates concerning for pneumonia so was treated with antibiotics x 5 days -PCCM consult appreciated: Needs home trach humidification prior to DC which has been arranged. S/p trach exchange 1/5 for cuffless 6 shiley prox XLT - CM has worked diligently to arrange for a slew of home health supplies and services. - Pt reports being on 4L O2 at baseline and is 100% on this at time of discharge.   Hypertension  - Continue home medications   Diabetes mellitus type 2 with hyperglycemia: HbA1c showing improvement from 12.7% to 7.8%. There was concern that the patient would be unable to receive tresiba insulin, though she confirms on day of discharge that her sister has picked up tresiba with copay of $34, but will need a refill of novolog, of which she takes 16 units TIDWC. She does not report hypoglycemic episodes at home, so this is prescribed at discharge, though should continue being managed actively as outpatient.    Chronic kidney disease stage IV: Near baseline. - Avoid nephrotoxins.   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  - Monitor intermittently.    Asthma: Continue home medications.    Hypomagnesemia - Repleted   Obesity: Estimated body mass index is 37.41 kg/m as calculated from the following:   Height as of this encounter: _0  (1.676 m).   Weight  as of 11/22/21: 105.1 kg.  Discharge Instructions Discharge Instructions     No dressing needed   Complete by: As directed    No wound care   Complete by: As directed       Allergies as of 12/17/2021    No Known Allergies      Medication List     TAKE these medications    albuterol (2.5 MG/3ML) 0.083% nebulizer solution Commonly known as: PROVENTIL Use 1 vial (2.5 mg total) by nebulization every 4 (four) hours as needed for wheezing or shortness of breath.   amLODipine 10 MG tablet Commonly known as: NORVASC Take 1 tablet (10 mg total) by mouth daily.   aspirin EC 81 MG tablet Take 1 tablet (81 mg total) by mouth daily. Swallow whole.   atorvastatin 20 MG tablet Commonly known as: LIPITOR Take 1 tablet (20 mg total) by mouth daily.   budesonide 0.25 MG/2ML nebulizer solution Commonly known as: PULMICORT Use 1 vial (0.25 mg total) by nebulization 2 (two) times daily.   carvedilol 25 MG tablet Commonly known as: COREG Take 1 tablet (25 mg total) by mouth 2 (two) times daily.   clonazePAM 0.5 MG tablet Commonly known as: KLONOPIN Take 1/2 tablet (0.25 mg total) by mouth daily at bedtime.   DULoxetine 60 MG capsule Commonly known as: CYMBALTA Take 1 capsule (60 mg total) by mouth daily.   hydrALAZINE 100 MG tablet Commonly known as: APRESOLINE Take 1 tablet (100 mg total) by mouth 3 (three) times daily.   insulin aspart 100 UNIT/ML injection Commonly known as: novoLOG Inject 16 Units into the skin 3 (three) times daily with meals. What changed:  how much to take how to take this when to take this additional instructions   losartan 50 MG tablet Commonly known as: Cozaar Take 1 tablet (50 mg total) by mouth daily.   pantoprazole 40 MG tablet Commonly known as: PROTONIX Take 1 tablet (40 mg total) by mouth 2 (two) times daily.   Tyler Aas FlexTouch 200 UNIT/ML FlexTouch Pen Generic drug: insulin degludec Inject 32 Units into the skin 2 (two) times daily.               Durable Medical Equipment  (From admission, onward)           Start     Ordered   12/15/21 1007  For home use only DME Other see comment  Once       Comments: HME and Shiley 6  cuffless proximal XLT  Question:  Length of Need  Answer:  Lifetime   12/15/21 1007   12/14/21 1629  For home use only DME oxygen  Once       Question Answer Comment  Length of Need Lifetime   Mode or (Route) Mask   Liters per Minute 3   Frequency Continuous (stationary and portable oxygen unit needed)   Oxygen delivery system Gas      12/14/21 1629   12/11/21 0821  For home use only DME Other see comment  Once       Comments: O2 humidified  Question:  Length of Need  Answer:  Lifetime   12/11/21 0820   12/10/21 1548  For home use only DME Trach supplies  (For Home Use Only DME Trach Supplies)  Once       Question Answer Comment  Trach Type Shiley   Cuffed or Uncuffed Uncuffed   Fenestrated No   Size 6   XLT Length Yes  Proximal or Distal Proximal   Back Up Trach Type Portex   Cuffed or Uncuffed Uncuffed   Fenestrated No   Size 5   XLT Length Yes   Trach Supplies/Equipment for Home Use Tube Holder/Collar   Trach Supplies/Equipment for Home Use Tracheostomy Drain Dressings   Trach Supplies/Equipment for Home Use Tracheostomy Care Cleaning Kits   Trach Supplies/Equipment for Home Use Speaking Valve   Trach Supplies/Equipment for Home Use Humidification (oxygen 5 liters via trach collar to provide specified % - if tracheostomy is capped with occlusive cap during day, a separate [place] order will need to be provided)   Trach Supplies/Equipment for Home Use Suction Catheters   Suction Catheter Size 12 French (for size 5)   Cleaning Kits 60   Oxygen % 30% 28     12/10/21 1549   12/09/21 1601  For home use only DME Other see comment  Once       Comments: POC eval  Question:  Length of Need  Answer:  Lifetime   12/09/21 1601              Discharge Care Instructions  (From admission, onward)           Start     Ordered   12/17/21 0000  No dressing needed        12/17/21 1307            Follow-up Information     [facility] Follow up.   Why:  Portable- Oxygen tanks, trach supplies-trach humidification masks. Contact information: [place] [address] [phone]         [facility] Follow up.   Specialty: Home Health Services Why: Physical & Occupational Therap, Speech Therapy-office to call patient with visit times. Contact information: [place] [address] [phone]         Flossie Buffy, NP Follow up.   Specialty: Internal Medicine Contact information: [place] [address] [phone]         Erick Colace, NP Follow up in 1 week(s).   Specialties: Nurse Practitioner, Acute Care, Pulmonary Disease Contact information: [address] [place] [address] [place] [address] [phone]                No Known Allergies  Consultations: PCCM  Procedures/Studies: DG CHEST PORT 1 VIEW  Result Date: 12/16/2021 CLINICAL DATA:  Tracheostomy tube change EXAM: PORTABLE CHEST 1 VIEW COMPARISON:  12/08/2021 FINDINGS: Single frontal view of the chest demonstrates tracheostomy tube overlying tracheal air column tip just below thoracic inlet. The cardiac silhouette is stable. No airspace disease, effusion, or pneumothorax. No acute bony abnormality. IMPRESSION: 1. No acute intrathoracic process.  Unremarkable tracheostomy tube. Electronically Signed   By: Randa Ngo M.D.   On: 12/16/2021 15:34   DG CHEST PORT 1 VIEW  Result Date: 12/08/2021 CLINICAL DATA:  Hypoxemia. EXAM: PORTABLE CHEST 1 VIEW COMPARISON:  12/06/2021 FINDINGS: Poor inspiration with mild improvement. Stable mildly enlarged cardiac silhouette. Decreased left basilar atelectasis. Clear right lung. Thoracic spine degenerative changes. IMPRESSION: Improving left basilar atelectasis. Electronically Signed   By: Claudie Revering M.D.   On: 12/08/2021 09:21   DG Chest Port 1 View  Result Date: 12/06/2021 CLINICAL DATA:  Sepsis  workup. EXAM: PORTABLE CHEST 1 VIEW COMPARISON:  Portable chest 10/16/2021. FINDINGS: Interval removal of prior feeding tube and right PICC. Tracheostomy cannula remains in place  common not significantly changed. There are multiple overlying monitor wires, more so on the right. There is mild-to-moderate cardiomegaly with normal caliber central vasculature. There is a low inspiratory effort , with asymmetric increased opacity in the left base which could be asymmetric atelectasis, pneumonia or aspiration. Remaining lungs are clear. No pleural effusion is seen. There are calcifications in the aortic arch. Osteopenia and thoracic spondylosis. IMPRESSION: Low-inspiration study with asymmetric opacity left base, which could be asymmetric atelectasis, pneumonia, or aspiration. Follow-up study recommended in full inspiration. Electronically Signed   By: Telford Nab M.D.   On: 12/06/2021 22:12     Subjective: Doing well, better able to phonate w/finger occlusion of trach. No dyspnea or chest pain. Amenable to going home today, feels prepared.  Discharge Exam: Vitals:   12/17/21 1212 12/17/21 1216  BP:  (!) 152/64  Pulse:  83  Resp: 20 20  Temp:  98 F (36.7 C)  SpO2:  100%   General: Pt is alert, awake, not in acute distress Neck: MMM, midline 6 shiley xlt prox trach without discharge, clean site without significant erythema. Cardiovascular: RRR, S1/S2 +, no rubs, no gallops Respiratory: Nonlabored, 100% on 28% humidified ATC. Abdominal: Soft, NT, ND, bowel sounds + Extremities: No pitting edema, no cyanosis  Labs: BNP (last 3 results) Recent Labs    10/01/21 0438  BNP 86.5   Basic Metabolic Panel: Recent Labs  Lab 12/11/21 1310 12/12/21 0040 12/13/21 0103 12/14/21 0600 12/15/21 0307  NA 139 139 140 139 138  K 4.1 4.7 4.1 4.0 4.3  CL 106 108 106 106 107  CO2 _0 GLUCOSE 163* 225* 235* 182* 216*  BUN 21* 25* 25* 27* 36*  CREATININE 1.82* 1.88* 2.20* 1.94* 2.04*   CALCIUM 9.5 9.8 9.9 9.5 9.7  MG  --   --   --  1.8  --    Liver Function Tests: No results for input(s): AST, ALT, ALKPHOS, BILITOT, PROT, ALBUMIN in the last 168 hours. No results for input(s): LIPASE, AMYLASE in the last 168 hours. No results for input(s): AMMONIA in the last 168 hours. CBC: Recent Labs  Lab 12/11/21 1310 12/12/21 0040 12/13/21 0103 12/14/21 0600 12/15/21 0307  WBC 10.2 8.6 8.0 9.4 9.2  HGB 9.4* 10.0* 9.1* 8.9* 8.9*  HCT 31.0* 32.0* 30.6* 28.3* 28.4*  MCV 93.9 93.3 94.4 92.8 93.4  PLT 216 222 216 212 214   Cardiac Enzymes: No results for input(s): CKTOTAL, CKMB, CKMBINDEX, TROPONINI in the last 168 hours. BNP: Invalid input(s): POCBNP CBG: Recent Labs  Lab 12/16/21 1416 12/16/21 1621 12/16/21 2109 12/17/21 0734 12/17/21 1214  GLUCAP 179* 242* 229* 229* 285*   D-Dimer No results for input(s): DDIMER in the last 72 hours. Hgb A1c Recent Labs    12/15/21 1440  HGBA1C 7.8*   Lipid Profile No results for input(s): CHOL, HDL, LDLCALC, TRIG, CHOLHDL, LDLDIRECT in the last 72 hours. Thyroid function studies No results for input(s): TSH, T4TOTAL, T3FREE, THYROIDAB in the last 72 hours.  Invalid input(s): FREET3 Anemia work up No results for input(s): VITAMINB12, FOLATE, FERRITIN, TIBC, IRON, RETICCTPCT in the last 72 hours. Urinalysis    Component Value Date/Time   COLORURINE YELLOW 12/07/2021 1211   APPEARANCEUR CLEAR 12/07/2021 1211   LABSPEC 1.020 12/07/2021 1211   PHURINE 6.0 12/07/2021 1211   GLUCOSEU >=500 (A) 12/07/2021 1211   HGBUR NEGATIVE 12/07/2021 1211   BILIRUBINUR NEGATIVE 12/07/2021 1211   KETONESUR NEGATIVE 12/07/2021 1211   PROTEINUR >  300 (A) 12/07/2021 1211   NITRITE NEGATIVE 12/07/2021 1211   [place] 12/07/2021 1211    Microbiology Recent Results (from the past 240 hour(s))  Culture, Respiratory w Gram Stain     Status: None   Collection Time: 12/10/21  1:49 PM   Specimen: Bronchoalveolar Lavage;  Respiratory  Result Value Ref Range Status   Specimen Description BRONCHIAL ALVEOLAR LAVAGE  Final   Special Requests LLL  Final   Gram Stain   Final    RARE WBC PRESENT,BOTH PMN AND MONONUCLEAR NO ORGANISMS SEEN    Culture   Final    NO GROWTH 2 DAYS Performed at [facility], [address], [place], [place] [address]    Report Status 12/12/2021 FINAL  Final    Time coordinating discharge: Approximately 40 minutes  Patrecia Pour, MD  [facility] 12/17/2021, 1:11 PM

## 2021-12-17 NOTE — TOC Transition Note (Addendum)
Transition of Care [facility]) - CM/SW Discharge Note   Patient Details  Name: Maureen Jones MRN: [id number] Date of Birth: [birth year]  Transition of [facility]) CM/SW Contact:  Bethena Roys, RN Phone Number: 12/17/2021, 12:47 PM   Clinical Narrative: Case Manager called [employer] for portable oxygen tanks for home. Tanks have been delivered to the room. Patient had additional questions regarding her Novolog insulin. Staff RN is aware and will contact the provider. Patient has additional trach supplies in the room  and the CSW will assist with transportation home. [facility] is aware that the patient is discharging home today and will need services for home. No further needs identified at this time.   Patient will need Home Health Orders for PT, OT, Speech Therapy   Final next level of care: [place] Barriers to Discharge: Continued Medical Work up   Patient Goals and CMS Choice Patient states their goals for this hospitalization and ongoing recovery are:: plan to return home with caregiver-[phone]   Choice offered to / list presented to : Patient   Discharge Plan and Services In-house Referral: NA Discharge Planning Services: CM Consult Post Acute Care Choice: Home Health, Resumption of Svcs/PTA Provider          DME Arranged: Ventilator, Trach supplies (trach humidification mask-[employer] to supply) DME Agency: [facility] Date DME Agency Contacted: 12/13/21 Time DME Agency Contacted: 88 Representative spoke with at DME Agency: Thedore Mins HH Arranged: PT, OT, Speech Therapy HH Agency: [facility] Date [place]: 12/13/21 Time [place]: 1200 Representative spoke with at [place] (Left vociemail)    Readmission Risk Interventions Readmission Risk Prevention Plan 12/13/2021 11/15/2021  Transportation Screening Complete Complete  PCP or Specialist Appt within 3-5 Days Complete Complete  HRI or [place] Complete Complete  Social Work Consult for [place] Planning/Counseling Complete Patient refused  Palliative Care Screening Not Applicable Not Applicable  Medication Review Press photographer) Complete Complete

## 2021-12-17 NOTE — Plan of Care (Signed)

## 2021-12-17 NOTE — TOC Transition Note (Addendum)
Transition of Care [facility]) - CM/SW Discharge Note   Patient Details  Name: Maureen Jones MRN: [id number] Date of Birth: July 30, 1966  Transition of Care [facility]) CM/SW Contact:  Milas Gain, [place] Phone Number: 12/17/2021, 2:01 PM   Clinical Narrative:     Patient will DC to: [address] [place] [address]  Anticipated DC date: 12/17/2021  Family notified: [name]   Transport by: Larence Penning transport-[facility]oor to Door-Pick up time between 3:15pm-3:30pm  ?  Per MD patient ready for DC to home address . RN, patient, and patient's family notified of DC. [facility] transport [facility] requested for patient.  CSW signing off.    Final next level of care: Home/Self Care Barriers to Discharge: No Barriers Identified   Patient Goals and CMS Choice Patient states their goals for this hospitalization and ongoing recovery are:: to return back home CMS Medicare.gov Compare Post Acute Care list provided to:: Patient Choice offered to / list presented to : Patient  Discharge Placement                Patient to be transferred to facility by: door to door transport Name of family member notified: [name] Patient and family notified of of transfer: 12/17/21  Discharge Plan and Services In-house Referral: NA Discharge Planning Services: CM Consult Post Acute Care Choice: Home Health, Resumption of Svcs/PTA Provider          DME Arranged: Ventilator, Trach supplies (trach humidification mask-[employer] to supply) DME Agency: [employer] Date DME Agency Contacted: 12/13/21 Time DME Agency Contacted: 63 Representative spoke with at DME Agency: Thedore Mins HH Arranged: PT, OT, Speech Therapy HH Agency: [facility] Date [place]: 12/13/21 Time [place]: 23 Representative spoke with at [place] (Left vociemail)  Social Determinants of Health ([place]) Interventions     Readmission Risk Interventions Readmission Risk Prevention Plan 12/13/2021 11/15/2021   Transportation Screening Complete Complete  PCP or Specialist Appt within 3-5 Days Complete Complete  HRI or Home Care Consult Complete Complete  Social Work Consult for [place] Planning/Counseling Complete Patient refused  Palliative Care Screening Not Applicable Not Applicable  Medication Review Press photographer) Complete Complete

## 2021-12-17 NOTE — Telephone Encounter (Signed)
No, and Amy states the patient informed her that the Rx needed cost $35.00 but she just could not pay it at this time.

## 2021-12-17 NOTE — Progress Notes (Signed)
Occupational Therapy Treatment Patient Details Name: Maureen Jones MRN: [id number] DOB: 08-Mar-1966 Today's Date: 12/17/2021   History of present illness 56 y/o female presented to ED on 12/26 for SOB and nausea. Chest x-ray shows infiltrates concerning for aspiration. Found to have blood clot completing occluding airway. Trach changed on 12/28. Pt again with occlusion of trach on 12/30 and moved to ICU and trach upsized. PMH: HTN, DM, trach.   OT comments  Pt. Seen for skilled OT treatment session.  Pt. Able to complete bed mobility mod I.  LB dressing set up seated, sit/stand for repositioning in bed and precursor to transfers mod I.  Likely d/c home later today.  Pt. Is eager and reports she will have S/assistance from sister at home.  Current d/c recommendations remain appropriate.     Recommendations for follow up therapy are one component of a multi-disciplinary discharge planning process, led by the attending physician.  Recommendations may be updated based on patient status, additional functional criteria and insurance authorization.    Follow Up Recommendations  No OT follow up    Assistance Recommended at Discharge Intermittent Supervision/Assistance  Patient can return home with the following      Equipment Recommendations  None recommended by OT    Recommendations for Other Services      Precautions / Restrictions Precautions Precautions: Other (comment) Precaution Comments: trach       Mobility Bed Mobility Overal bed mobility: Modified Independent Bed Mobility: Supine to Sit;Sit to Supine     Supine to sit: Modified independent (Device/Increase time) Sit to supine: Modified independent (Device/Increase time);HOB elevated        Transfers Overall transfer level: Modified independent Equipment used: None Transfers: Sit to/from Stand Sit to Stand: Modified independent (Device/Increase time)           General transfer comment: Able to come to stand  safely pushing up from bed without LOB.     Balance                                           ADL either performed or assessed with clinical judgement   ADL Overall ADL's : Needs assistance/impaired                     Lower Body Dressing: Set up;Sitting/lateral leans Lower Body Dressing Details (indicate cue type and reason): socks             Functional mobility during ADLs: Supervision/safety General ADL Comments: able to complete sit/stand in prep for simulated toileting task S. delcined need for use of bsc or actual b.room    Extremity/Trunk Assessment              Vision       Perception     Praxis      Cognition                                                  Exercises     Shoulder Instructions       General Comments      Pertinent Vitals/ Pain       Pain Assessment: No/denies pain  Home Living  Prior Functioning/Environment              Frequency  Min 2X/week        Progress Toward Goals  OT Goals(current goals can now be found in the care plan section)  Progress towards OT goals: Progressing toward goals     Plan Frequency remains appropriate;Discharge plan remains appropriate    Co-evaluation                 AM-PAC OT "6 Clicks" Daily Activity     Outcome Measure   Help from another person eating meals?: None Help from another person taking care of personal grooming?: A Little Help from another person toileting, which includes using toliet, bedpan, or urinal?: A Little Help from another person bathing (including washing, rinsing, drying)?: A Little Help from another person to put on and taking off regular upper body clothing?: None Help from another person to put on and taking off regular lower body clothing?: A Little 6 Click Score: 20    End of Session Equipment Utilized During Treatment: Oxygen  OT  Visit Diagnosis: Other abnormalities of gait and mobility (R26.89)   Activity Tolerance Patient tolerated treatment well   Patient Left in bed;with call bell/phone within reach   Nurse Communication          Time: 6945-0388 OT Time Calculation (min): 9 min  Charges: OT General Charges $OT Visit: 1 Visit OT Treatments $Self Care/Home Management : 8-22 mins  Sonia Baller, COTA/L Acute Rehabilitation [phone]   Tanya Nones 12/17/2021, 11:08 AM

## 2021-12-17 NOTE — Progress Notes (Signed)
Speech Language Pathology Treatment: [name] Speaking valve  Patient Details Name: Maureen Jones MRN: [id number] DOB: 12/24/1965 Today's Date: 12/17/2021 Time: [id number] SLP Time Calculation (min) (ACUTE ONLY): 14 min  Assessment / Plan / Recommendation Clinical Impression  Pt now has a #6 cuffless trach but continues to have insufficient upper airway patency to tolerate placement of PMV. Initial placement by pt resulted in significant back pressure with pt taking the valve off almost immediately, reporting that it was "cutting off her air." RT performed trach suction, after pt was able to wear valve for slightly longer duration (~30 seconds), during which time there was some airflow noted through the upper airway. Phonation was also achieved, although very strained and short in duration. Breathing also appeared to become strained fairly quickly, which is why valve was doffed after a short trial. Pt still had a burst of air upon removal, although subjectively not as strong as prior to suction. Overall, she still does not appear safe to wear PMV throughout waking hours; however, she is independent with placement, and therefore could use it intermittently to facilitate communication (such as when talking on the phone), placing it to vocalize but then removing it promptly after. Education including pictures was provided to reinforce safety precautions. If pt is able to safely transition back to a smaller trach in the future, anticipate that she would be able to use PMV more often again.    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 Started on antibiotics and nebulizers for presumed pneumonia. Prior to hospitalization patient reported running out of respiratory supplies x 1 week.     During this admission pt has continued to have respiratory distress  with mucous plugging and has finally had a 6 prox XLT placed on 12/30 with some success. Bronch has shown dynamic airway collapse.  Of note, patient had recent prolonged hospitalization earlier this month (Oct/Nov 2022) for failed extubation due to supraglottic edema requiring tracheostomy. During this admission had PEA arrest (11/2) after dislodged trach. Redo tracheostomy with 6 XLT. Was changed to cuffless 5 proximal XLT on 10/25/21. SLP saw pt throughout hspitalization, was ultimately independent with PMSV placement and precautions, participating in voice therapy prior to d/c. Pt had two MBS both showing some weakness and delay with penetration of thin liquids, recommended Dys 3/nectar, but upgraded to thin with precautions for single sips and no straws and tolerated well.      SLP Plan  Continue with current plan of care      Recommendations for follow up therapy are one component of a multi-disciplinary discharge planning process, led by the attending physician.  Recommendations may be updated based on patient status, additional functional criteria and insurance authorization.    Recommendations         Patient may use Passy-Muir Speech Valve: with SLP only MD: Please consider changing trach tube to : Smaller size         Follow Up Recommendations: Other (comment) (PRN after f/u with trach clinic) Assistance recommended at discharge: PRN SLP Visit Diagnosis: Aphonia (R49.1) Plan: Continue with current plan of care           Osie Bond., M.A. [facility] Pager [phone] Office [phone]  12/17/2021, 1:12 PM

## 2021-12-17 NOTE — Progress Notes (Signed)
°  PCCM Progress Note  S/p trach change 6 prox XLT cuffed to 6 prox XLT cuffless on 1/5   S:  Feels slight irritation in the back of her throat, otherwise doing well.  Better phonation with finger occlusion of trach   O:  Blood pressure 138/69, pulse 84, temperature 98.4 F (36.9 C), temperature source Oral, resp. rate 18, height 5\' 6"  (1.676 m), SpO2 100 %.  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  Ok from [employer] standpoint to discharge home Will need follow-up with Laurey Arrow B, NP in our trach clinic in 2 weeks.  Will have them call patient to set up appointment.        Kennieth Rad, ACNP [facility] 12/17/2021, 12:08 PM  See Amion for pager If no response to pager, please call PCCM consult pager After 7:00 pm call Elink

## 2021-12-20 ENCOUNTER — Encounter (HOSPITAL_COMMUNITY): Payer: Self-pay | Admitting: Pulmonary Disease

## 2021-12-20 ENCOUNTER — Telehealth: Payer: Self-pay

## 2021-12-20 ENCOUNTER — Telehealth: Payer: Self-pay | Admitting: Nurse Practitioner

## 2021-12-20 NOTE — Telephone Encounter (Signed)
LVM with patient to see if she would be ok with either of the following options:  -Purchasing a $25 vial of ReliOn brand 70/30 insulin at [facility] today.  -Have [facility] fill her prescription for Tyler Aas and charge her after 1/17. If so please put together an onboarding form with just the [place] and other info.  Would also be able to help patient apply for patient assistance for her Tyler Aas, but would likely not be able to get it approved prior to 12/28/20.   Junius Argyle, PharmD, Para March, CPP Clinical Pharmacist Practitioner  [facility]  [phone]

## 2021-12-20 NOTE — Progress Notes (Signed)
Left a voice message, Per Clinical Pharmacist,  Inform patient of the price, and see if she would prefer one of two options:   -Purchasing a $25 vial of insulin at [facility] today.   -Have [facility] fill her prescription for Tyler Aas and charge her after 1/17.  [place] Pharmacist Assistant [phone]

## 2021-12-20 NOTE — Telephone Encounter (Signed)
Amy has been updated and states she will also reach out to the patient and make her aware of this.

## 2021-12-20 NOTE — Progress Notes (Signed)
Per Clinical Pharmacist, please reach out to [place]  I want to know how much a vial of [facility]'s Relion 70/30 insulin costs .  Per [place], Relion 70/30 insulin is $24.88 and does not charge any tax.Notified Clinical pharmacist.  Anderson [place] Clinical Pharmacist Assistant [phone]

## 2021-12-23 ENCOUNTER — Encounter (HOSPITAL_COMMUNITY): Payer: Self-pay

## 2021-12-23 ENCOUNTER — Other Ambulatory Visit: Payer: Self-pay

## 2021-12-23 ENCOUNTER — Emergency Department (HOSPITAL_COMMUNITY)
Admission: EM | Admit: 2021-12-23 | Discharge: 2021-12-24 | Disposition: A | Discharge status: Home / Self Care | Payer: Medicare Other | Attending: Emergency Medicine | Admitting: Emergency Medicine

## 2021-12-23 ENCOUNTER — Emergency Department (HOSPITAL_COMMUNITY): Payer: Medicare Other

## 2021-12-23 DIAGNOSIS — Z794 Long term (current) use of insulin: Secondary | ICD-10-CM | POA: Insufficient documentation

## 2021-12-23 DIAGNOSIS — Z7951 Long term (current) use of inhaled steroids: Secondary | ICD-10-CM | POA: Diagnosis not present

## 2021-12-23 DIAGNOSIS — R0602 Shortness of breath: Secondary | ICD-10-CM | POA: Diagnosis not present

## 2021-12-23 DIAGNOSIS — Z79899 Other long term (current) drug therapy: Secondary | ICD-10-CM | POA: Insufficient documentation

## 2021-12-23 DIAGNOSIS — E1165 Type 2 diabetes mellitus with hyperglycemia: Secondary | ICD-10-CM | POA: Diagnosis not present

## 2021-12-23 DIAGNOSIS — Z7982 Long term (current) use of aspirin: Secondary | ICD-10-CM | POA: Insufficient documentation

## 2021-12-23 DIAGNOSIS — Z20822 Contact with and (suspected) exposure to covid-19: Secondary | ICD-10-CM | POA: Diagnosis not present

## 2021-12-23 DIAGNOSIS — I129 Hypertensive chronic kidney disease with stage 1 through stage 4 chronic kidney disease, or unspecified chronic kidney disease: Secondary | ICD-10-CM | POA: Insufficient documentation

## 2021-12-23 DIAGNOSIS — E1122 Type 2 diabetes mellitus with diabetic chronic kidney disease: Secondary | ICD-10-CM | POA: Diagnosis not present

## 2021-12-23 DIAGNOSIS — R1110 Vomiting, unspecified: Secondary | ICD-10-CM | POA: Insufficient documentation

## 2021-12-23 DIAGNOSIS — I160 Hypertensive urgency: Secondary | ICD-10-CM | POA: Diagnosis not present

## 2021-12-23 DIAGNOSIS — N189 Chronic kidney disease, unspecified: Secondary | ICD-10-CM | POA: Insufficient documentation

## 2021-12-23 DIAGNOSIS — R739 Hyperglycemia, unspecified: Secondary | ICD-10-CM

## 2021-12-23 LAB — CBC WITH DIFFERENTIAL/PLATELET
Abs Immature Granulocytes: 0.04 10*3/uL (ref 0.00–0.07)
Basophils Absolute: 0 10*3/uL (ref 0.0–0.1)
Basophils Relative: 0 %
Eosinophils Absolute: 0.1 10*3/uL (ref 0.0–0.5)
Eosinophils Relative: 1 %
HCT: 32.2 % — ABNORMAL LOW (ref 36.0–46.0)
Hemoglobin: 10 g/dL — ABNORMAL LOW (ref 12.0–15.0)
Immature Granulocytes: 0 %
Lymphocytes Relative: 12 %
Lymphs Abs: 1.2 10*3/uL (ref 0.7–4.0)
MCH: 28.8 pg (ref 26.0–34.0)
MCHC: 31.1 g/dL (ref 30.0–36.0)
MCV: 92.8 fL (ref 80.0–100.0)
Monocytes Absolute: 0.3 10*3/uL (ref 0.1–1.0)
Monocytes Relative: 3 %
Neutro Abs: 8.4 10*3/uL — ABNORMAL HIGH (ref 1.7–7.7)
Neutrophils Relative %: 84 %
Platelets: 212 10*3/uL (ref 150–400)
RBC: 3.47 MIL/uL — ABNORMAL LOW (ref 3.87–5.11)
RDW: 13.5 % (ref 11.5–15.5)
WBC: 10 10*3/uL (ref 4.0–10.5)
nRBC: 0 % (ref 0.0–0.2)

## 2021-12-23 LAB — RESP PANEL BY RT-PCR (FLU A&B, COVID) ARPGX2
Influenza A by PCR: NEGATIVE
Influenza B by PCR: NEGATIVE
SARS Coronavirus 2 by RT PCR: NEGATIVE

## 2021-12-23 LAB — CBG MONITORING, ED
Glucose-Capillary: 328 mg/dL — ABNORMAL HIGH (ref 70–99)
Glucose-Capillary: 319 mg/dL — ABNORMAL HIGH (ref 70–99)

## 2021-12-23 LAB — BASIC METABOLIC PANEL
Anion gap: 12 (ref 5–15)
BUN: 18 mg/dL (ref 6–20)
CO2: 23 mmol/L (ref 22–32)
Calcium: 10.1 mg/dL (ref 8.9–10.3)
Chloride: 102 mmol/L (ref 98–111)
Creatinine, Ser: 2.02 mg/dL — ABNORMAL HIGH (ref 0.44–1.00)
GFR, Estimated: 29 mL/min — ABNORMAL LOW (ref >60)
Glucose, Bld: 337 mg/dL — ABNORMAL HIGH (ref 70–99)
Potassium: 4 mmol/L (ref 3.5–5.1)
Sodium: 137 mmol/L (ref 135–145)

## 2021-12-23 LAB — BRAIN NATRIURETIC PEPTIDE: B Natriuretic Peptide: 84.4 pg/mL (ref 0.0–100.0)

## 2021-12-23 MED ORDER — SODIUM CHLORIDE 0.9 % IV BOLUS
1000.0000 mL | Freq: Once | INTRAVENOUS | Status: AC
  Administered 2021-12-23: 1000 mL via INTRAVENOUS

## 2021-12-23 MED ORDER — INSULIN ASPART 100 UNIT/ML IJ SOLN
5.0000 [IU] | Freq: Once | INTRAMUSCULAR | Status: AC
  Administered 2021-12-23: 5 [IU] via SUBCUTANEOUS

## 2021-12-23 MED ORDER — ONDANSETRON HCL 4 MG/2ML IJ SOLN
4.0000 mg | Freq: Once | INTRAMUSCULAR | Status: AC
  Administered 2021-12-23: 4 mg via INTRAVENOUS
  Filled 2021-12-23: qty 2

## 2021-12-23 MED ORDER — LABETALOL HCL 5 MG/ML IV SOLN
20.0000 mg | Freq: Once | INTRAVENOUS | Status: AC
  Administered 2021-12-23: 20 mg via INTRAVENOUS
  Filled 2021-12-23: qty 4

## 2021-12-23 MED ORDER — CARVEDILOL 12.5 MG PO TABS
25.0000 mg | ORAL_TABLET | Freq: Two times a day (BID) | ORAL | Status: DC
  Administered 2021-12-23: 25 mg via ORAL
  Filled 2021-12-23 (×2): qty 2

## 2021-12-23 MED ORDER — IPRATROPIUM-ALBUTEROL 0.5-2.5 (3) MG/3ML IN SOLN
3.0000 mL | Freq: Once | RESPIRATORY_TRACT | Status: AC
  Administered 2021-12-23: 3 mL via RESPIRATORY_TRACT
  Filled 2021-12-23: qty 3

## 2021-12-23 MED ORDER — SODIUM CHLORIDE 0.9 % IV SOLN
12.5000 mg | Freq: Once | INTRAVENOUS | Status: AC
  Administered 2021-12-23: 12.5 mg via INTRAVENOUS
  Filled 2021-12-23: qty 0.5

## 2021-12-23 MED ORDER — ONDANSETRON HCL 4 MG PO TABS
4.0000 mg | ORAL_TABLET | Freq: Three times a day (TID) | ORAL | 0 refills | Status: DC | PRN
  Filled 2021-12-23: qty 12, 4d supply, fill #0

## 2021-12-23 MED ORDER — LORAZEPAM 2 MG/ML IJ SOLN
1.0000 mg | Freq: Once | INTRAMUSCULAR | Status: AC
  Administered 2021-12-23: 1 mg via INTRAVENOUS
  Filled 2021-12-23: qty 1

## 2021-12-23 MED ORDER — AMLODIPINE BESYLATE 5 MG PO TABS
10.0000 mg | ORAL_TABLET | Freq: Once | ORAL | Status: AC
  Administered 2021-12-23: 10 mg via ORAL
  Filled 2021-12-23: qty 2

## 2021-12-23 MED ORDER — INSULIN ASPART 100 UNIT/ML IJ SOLN
5.0000 [IU] | Freq: Once | INTRAMUSCULAR | Status: AC
Start: 2021-12-23 — End: 2021-12-23
  Administered 2021-12-23: 5 [IU] via SUBCUTANEOUS

## 2021-12-23 MED ORDER — PROMETHAZINE HCL 25 MG PO TABS: 25.0000 mg | ORAL_TABLET | Freq: Four times a day (QID) | ORAL | 0 refills | Status: DC | PRN

## 2021-12-23 MED ORDER — HYDRALAZINE HCL 25 MG PO TABS: 100.0000 mg | ORAL_TABLET | Freq: Once | ORAL | Status: DC

## 2021-12-23 NOTE — ED Notes (Signed)
Called ptar for pt eta was 10th on the list

## 2021-12-23 NOTE — ED Provider Notes (Signed)
[facility] EMERGENCY DEPARTMENT Provider Note   CSN: [id number] Arrival date & time: 12/23/21  1454     History  Chief Complaint  Patient presents with   Shortness of Breath   Emesis    Maureen Jones is a 56 y.o. female.  The history is provided by the patient and medical records. No language interpreter was used.  Shortness of Breath Associated symptoms: vomiting   Emesis  56 year old female with significant history of subglottic edema status post tracheostomy, hypertension, obstructive sleep apnea, diabetes, CKD brought here via EMS from home with complaints of shortness of breath.  History is difficult to obtain as patient is having trouble speaking through her tracheostomy.  Home Medications Prior to Admission medications   Medication Sig Start Date End Date Taking? Authorizing Provider  albuterol (PROVENTIL) (2.5 MG/3ML) 0.083% nebulizer solution Use 1 vial (2.5 mg total) by nebulization every 4 (four) hours as needed for wheezing or shortness of breath. 11/15/21   Elgergawy, Silver Huguenin, MD  amLODipine (NORVASC) 10 MG tablet Take 1 tablet (10 mg total) by mouth daily. 11/16/21   Elgergawy, Silver Huguenin, MD  aspirin EC 81 MG tablet Take 1 tablet (81 mg total) by mouth daily. Swallow whole. 12/17/21 01/16/22  Patrecia Pour, MD  atorvastatin (LIPITOR) 20 MG tablet Take 1 tablet (20 mg total) by mouth daily. 11/16/21   Elgergawy, Silver Huguenin, MD  budesonide (PULMICORT) 0.25 MG/2ML nebulizer solution Use 1 vial (0.25 mg total) by nebulization 2 (two) times daily. 11/15/21   Elgergawy, Silver Huguenin, MD  carvedilol (COREG) 25 MG tablet Take 1 tablet (25 mg total) by mouth 2 (two) times daily. 11/15/21   Elgergawy, Silver Huguenin, MD  clonazePAM (KLONOPIN) 0.5 MG tablet Take 1/2 tablet (0.25 mg total) by mouth daily at bedtime. 11/15/21   Elgergawy, Silver Huguenin, MD  DULoxetine (CYMBALTA) 60 MG capsule Take 1 capsule (60 mg total) by mouth daily. 11/22/21   Nche, Charlene Brooke, NP  hydrALAZINE  (APRESOLINE) 100 MG tablet Take 1 tablet (100 mg total) by mouth 3 (three) times daily. 11/15/21   Elgergawy, Silver Huguenin, MD  insulin lispro (HUMALOG) 100 UNIT/ML KwikPen Inject 16 Units into the skin 3 (three) times daily with meals. 12/17/21   Patrecia Pour, MD  losartan (COZAAR) 50 MG tablet Take 1 tablet (50 mg total) by mouth daily. 11/15/21 12/15/21  Elgergawy, Silver Huguenin, MD  pantoprazole (PROTONIX) 40 MG tablet Take 1 tablet (40 mg total) by mouth 2 (two) times daily. 11/15/21   Elgergawy, Silver Huguenin, MD  TRESIBA FLEXTOUCH 200 UNIT/ML FlexTouch Pen Inject 32 Units into the skin 2 (two) times daily. 11/22/21   Nche, Charlene Brooke, NP  insulin aspart (NOVOLOG) 100 UNIT/ML injection Insulin (Novolog) sliding scale for additional Novolog: administer 65minutes before meals. Sugar <150 - no units Sugar >151-200 - 4 units Sugar >200-250 - 6 units Sugar >251-300 - 10 units and call office. Patient taking differently: Inject 4-10 Units into the skin See admin instructions. Insulin (Novolog) sliding scale for additional Novolog: administer 48minutes before meals. Sugar <150 - no units Sugar >151-200 - 4 units Sugar >200-250 - 6 units Sugar >251-300 - 10 units and call office. 11/22/21 12/17/21  Nche, Charlene Brooke, NP      Allergies    Patient has no known allergies.    Review of Systems   Review of Systems  Respiratory:  Positive for shortness of breath.   Gastrointestinal:  Positive for vomiting.  All other systems reviewed  and are negative.  Physical Exam Updated Vital Signs BP (!) 208/100    Pulse 88    Temp 97.8 F (36.6 C)    Resp 12    SpO2 97%  Physical Exam Vitals and nursing note reviewed.  Constitutional:      General: She is not in acute distress.    Appearance: She is well-developed. She is obese.  HENT:     Head: Atraumatic.  Eyes:     Conjunctiva/sclera: Conjunctivae normal.  Neck:     Comments: Tracheostomy tube is in place, it appears that there is a mucous plug  noted Cardiovascular:     Rate and Rhythm: Normal rate and regular rhythm.  Pulmonary:     Effort: Pulmonary effort is normal.     Breath sounds: No decreased breath sounds or wheezing.  Chest:     Chest wall: No tenderness.  Musculoskeletal:     Cervical back: Neck supple.     Right lower leg: No edema.     Left lower leg: No edema.  Skin:    General: Skin is warm.     Findings: No rash.  Neurological:     General: No focal deficit present.     Mental Status: She is alert.  Psychiatric:        Mood and Affect: Mood normal.    ED Results / Procedures / Treatments   Labs (all labs ordered are listed, but only abnormal results are displayed) Labs Reviewed  BASIC METABOLIC PANEL - Abnormal; Notable for the following components:      Result Value   Glucose, Bld 337 (*)    Creatinine, Ser 2.02 (*)    GFR, Estimated 29 (*)    All other components within normal limits  CBC WITH DIFFERENTIAL/PLATELET - Abnormal; Notable for the following components:   RBC 3.47 (*)    Hemoglobin 10.0 (*)    HCT 32.2 (*)    Neutro Abs 8.4 (*)    All other components within normal limits  CBG MONITORING, ED - Abnormal; Notable for the following components:   Glucose-Capillary 328 (*)    All other components within normal limits  CBG MONITORING, ED - Abnormal; Notable for the following components:   Glucose-Capillary 319 (*)    All other components within normal limits  RESP PANEL BY RT-PCR (FLU A&B, COVID) ARPGX2  BRAIN NATRIURETIC PEPTIDE    EKG None ED ECG REPORT   Date: 12/23/2021  Rate: 88  Rhythm: normal sinus rhythm  QRS Axis: normal  Intervals: normal  ST/T Wave abnormalities: nonspecific ST changes  Conduction Disutrbances:right bundle branch block  Narrative Interpretation:   Old EKG Reviewed: unchanged  I have personally reviewed the EKG tracing and agree with the computerized printout as noted.   Radiology DG Chest Port 1 View  Result Date: 12/23/2021 CLINICAL DATA:   Shortness of breath. EXAM: PORTABLE CHEST 1 VIEW COMPARISON:  December 16, 2021. FINDINGS: The heart size and mediastinal contours are within normal limits. Endotracheal tube is in good position. Mild bibasilar subsegmental atelectasis is noted. The visualized skeletal structures are unremarkable. IMPRESSION: Mild bibasilar subsegmental atelectasis. Electronically Signed   By: Marijo Conception M.D.   On: 12/23/2021 15:39    Procedures Procedures    Medications Ordered in ED Medications  carvedilol (COREG) tablet 25 mg (25 mg Oral Given 12/23/21 1843)  labetalol (NORMODYNE) injection 20 mg (20 mg Intravenous Given 12/23/21 1608)  ondansetron (ZOFRAN) injection 4 mg (4 mg Intravenous Given 12/23/21  1606)  ipratropium-albuterol (DUONEB) 0.5-2.5 (3) MG/3ML nebulizer solution 3 mL (3 mLs Nebulization Given 12/23/21 1626)  insulin aspart (novoLOG) injection 5 Units (5 Units Subcutaneous Given 12/23/21 1723)  sodium chloride 0.9 % bolus 1,000 mL (1,000 mLs Intravenous New Bag/Given 12/23/21 1809)  ondansetron (ZOFRAN) injection 4 mg (4 mg Intravenous Given 12/23/21 1809)  insulin aspart (novoLOG) injection 5 Units (5 Units Subcutaneous Given 12/23/21 1812)  LORazepam (ATIVAN) injection 1 mg (1 mg Intravenous Given 12/23/21 1824)  amLODipine (NORVASC) tablet 10 mg (10 mg Oral Given 12/23/21 1844)    ED Course/ Medical Decision Making/ A&P                           Medical Decision Making  BP (!) 208/100    Pulse 88    Temp 97.8 F (36.6 C)    Resp 12    SpO2 97%   3:53 PM This is a 56 year old female with significant history of recent respiratory failure in the setting of hypertensive emergency and hypoxic.  Due to vocal cord dysfunction and supraglottic edema as well as asked asthma exacerbation when she was initially intubated and was placed on the ventilator and subsequently had a tracheostomy and was discharged on November 15, 2021.  This history was obtained by reviewing patient's prior record.  She has  had issues with plugging of trach and requiring suction.  She underwent successful cuffless trach in endoscopy on 12/16/2021.  She presents today with difficulty breathing as well as nausea.  Initially, EMS noted that patient was wheezing upon arrival, she was placed on nebulizer and her trach was suctioned.  After suctioning, patient appears to be more comfortable.  On exam, this is an obese patient, sitting upright, appears fatigue.  She has an emesis bag next to her.  [name] appears clogged, respiratory therapist now at bedside and actively suctioning and exchanging tube.  Patient appears more comfortable.  She is hypertensive with blood pressure of 208/100.  She is not hypoxic.  She is having difficulty communicating verbally but was able to communicate by writing down on paper.  She endorsed nausea but denies any abdominal pain.  No fever denies chest pain.  Shortness of breath has improved.  5:55 PM Labs, EKG, and chest x-ray was independently reviewed and interpreted by me.  Patient was found to be hyperglycemic with a CBG of 328 with normal anion gap.  She does have history of diabetes.  She will be receiving insulin and IV fluid for management.  She has a negative viral respiratory panel including negative COVID and flu test.  Her creatinine is 2.02, similar to prior.  She has normal BNP.  She has normal white count.  Chest x-ray shows mild bibasilar subsegmental atelectasis.  Her EKG with evidence of right bundle branch block and sinus rhythm, unchanged from prior.  Patient remains hypertensive with systolic blood pressure of 207.  Will provide additional blood pressure management.  6:57 PM Pt sign out to Dr. Rogene Houston who will monitor her BP and her CBG.  She is currently receiving insulin and IVF.  If improves, she will likely able to be discharge home.         Final Clinical Impression(s) / ED Diagnoses Final diagnoses:  Shortness of breath  Hyperglycemia  Hypertensive urgency     Rx / DC Orders ED Discharge Orders          Ordered    ondansetron (ZOFRAN) 4 MG tablet  Every 8 hours PRN        12/23/21 1857              Domenic Moras, PA-C 12/23/21 1858    Fredia Sorrow, MD 12/23/21 2216

## 2021-12-23 NOTE — Telephone Encounter (Signed)
Orders faxed and sent over

## 2021-12-23 NOTE — Discharge Instructions (Addendum)
You have been evaluated for your symptoms.  Your shortness of breath is likely due to mucous plug which was evacuated in the ED.  Your blood pressure is quite high, you did receive blood pressure medication here.  Please make sure you take your blood pressure as prescribed.  Your blood sugar is very high as well, make sure to monitor closely and take medication as needed.  Follow-up with your provider for further care, return if your symptoms worsen.  You may take Zofran as needed for nausea.  In addition we have given you a prescription for Phenergan that she can take for the nausea since that seemed to help you better.  Follow-up with your doctors about the blood pressure.  And continue to have good trach care.

## 2021-12-23 NOTE — ED Notes (Signed)
RT called for neb tx.

## 2021-12-23 NOTE — ED Triage Notes (Signed)
Pt brought to ED via EMS from home with c/o abd pain, n/v, shob that started today. Trach in place. EMS reports patient was wheezing upon arrival to home, nebulizer administered and trach suctioned. Shob resolved PTA per EMS. Recently hospitalizations for pneumonia, encephalopathy. Patient alert on arrival to ED, vomiting into emesis bag on arrival.  EMS v/s: 175 palpated 99% trach collar attached to NRB 20RR 88 HR

## 2021-12-24 ENCOUNTER — Other Ambulatory Visit (HOSPITAL_COMMUNITY): Payer: Self-pay

## 2021-12-24 MED ORDER — ONDANSETRON HCL 4 MG/2ML IJ SOLN
4.0000 mg | Freq: Once | INTRAMUSCULAR | Status: AC
  Administered 2021-12-24: 4 mg via INTRAVENOUS
  Filled 2021-12-24: qty 2

## 2021-12-27 ENCOUNTER — Emergency Department (HOSPITAL_COMMUNITY)
Admission: EM | Admit: 2021-12-27 | Discharge: 2021-12-28 | Disposition: A | Discharge status: Home / Self Care | Payer: Medicare Other | Attending: Emergency Medicine | Admitting: Emergency Medicine

## 2021-12-27 ENCOUNTER — Emergency Department (HOSPITAL_COMMUNITY): Payer: Medicare Other

## 2021-12-27 ENCOUNTER — Other Ambulatory Visit: Payer: Self-pay

## 2021-12-27 DIAGNOSIS — J9500 Unspecified tracheostomy complication: Secondary | ICD-10-CM

## 2021-12-27 DIAGNOSIS — N189 Chronic kidney disease, unspecified: Secondary | ICD-10-CM | POA: Insufficient documentation

## 2021-12-27 DIAGNOSIS — Z794 Long term (current) use of insulin: Secondary | ICD-10-CM | POA: Diagnosis not present

## 2021-12-27 DIAGNOSIS — Z7982 Long term (current) use of aspirin: Secondary | ICD-10-CM | POA: Diagnosis not present

## 2021-12-27 DIAGNOSIS — R1110 Vomiting, unspecified: Secondary | ICD-10-CM | POA: Insufficient documentation

## 2021-12-27 DIAGNOSIS — R0602 Shortness of breath: Secondary | ICD-10-CM | POA: Diagnosis not present

## 2021-12-27 DIAGNOSIS — R739 Hyperglycemia, unspecified: Secondary | ICD-10-CM

## 2021-12-27 DIAGNOSIS — E1122 Type 2 diabetes mellitus with diabetic chronic kidney disease: Secondary | ICD-10-CM | POA: Diagnosis not present

## 2021-12-27 DIAGNOSIS — R059 Cough, unspecified: Secondary | ICD-10-CM | POA: Insufficient documentation

## 2021-12-27 DIAGNOSIS — Z20822 Contact with and (suspected) exposure to covid-19: Secondary | ICD-10-CM | POA: Insufficient documentation

## 2021-12-27 DIAGNOSIS — R079 Chest pain, unspecified: Secondary | ICD-10-CM | POA: Insufficient documentation

## 2021-12-27 DIAGNOSIS — R112 Nausea with vomiting, unspecified: Secondary | ICD-10-CM

## 2021-12-27 LAB — CBC WITH DIFFERENTIAL/PLATELET
Abs Immature Granulocytes: 0.05 10*3/uL (ref 0.00–0.07)
Basophils Absolute: 0 10*3/uL (ref 0.0–0.1)
Basophils Relative: 0 %
Eosinophils Absolute: 0.1 10*3/uL (ref 0.0–0.5)
Eosinophils Relative: 0 %
HCT: 33.2 % — ABNORMAL LOW (ref 36.0–46.0)
Hemoglobin: 10.9 g/dL — ABNORMAL LOW (ref 12.0–15.0)
Immature Granulocytes: 0 %
Lymphocytes Relative: 23 %
Lymphs Abs: 3.2 10*3/uL (ref 0.7–4.0)
MCH: 29.1 pg (ref 26.0–34.0)
MCHC: 32.8 g/dL (ref 30.0–36.0)
MCV: 88.8 fL (ref 80.0–100.0)
Monocytes Absolute: 1 10*3/uL (ref 0.1–1.0)
Monocytes Relative: 7 %
Neutro Abs: 9.5 10*3/uL — ABNORMAL HIGH (ref 1.7–7.7)
Neutrophils Relative %: 70 %
Platelets: 225 10*3/uL (ref 150–400)
RBC: 3.74 MIL/uL — ABNORMAL LOW (ref 3.87–5.11)
RDW: 13.2 % (ref 11.5–15.5)
WBC: 13.8 10*3/uL — ABNORMAL HIGH (ref 4.0–10.5)
nRBC: 0 % (ref 0.0–0.2)

## 2021-12-27 LAB — COMPREHENSIVE METABOLIC PANEL
ALT: 12 U/L (ref 0–44)
AST: 17 U/L (ref 15–41)
Albumin: 2.6 g/dL — ABNORMAL LOW (ref 3.5–5.0)
Alkaline Phosphatase: 70 U/L (ref 38–126)
Anion gap: 9 (ref 5–15)
BUN: 29 mg/dL — ABNORMAL HIGH (ref 6–20)
CO2: 25 mmol/L (ref 22–32)
Calcium: 8.3 mg/dL — ABNORMAL LOW (ref 8.9–10.3)
Chloride: 100 mmol/L (ref 98–111)
Creatinine, Ser: 2.33 mg/dL — ABNORMAL HIGH (ref 0.44–1.00)
GFR, Estimated: 24 mL/min — ABNORMAL LOW (ref >60)
Glucose, Bld: 355 mg/dL — ABNORMAL HIGH (ref 70–99)
Potassium: 3.5 mmol/L (ref 3.5–5.1)
Sodium: 134 mmol/L — ABNORMAL LOW (ref 135–145)
Total Bilirubin: 0.9 mg/dL (ref 0.3–1.2)
Total Protein: 5 g/dL — ABNORMAL LOW (ref 6.5–8.1)

## 2021-12-27 LAB — LIPASE, BLOOD: Lipase: 37 U/L (ref 11–51)

## 2021-12-27 LAB — TROPONIN I (HIGH SENSITIVITY): Troponin I (High Sensitivity): 22 ng/L — ABNORMAL HIGH (ref <18)

## 2021-12-27 LAB — RESP PANEL BY RT-PCR (FLU A&B, COVID) ARPGX2
Influenza A by PCR: NEGATIVE
Influenza B by PCR: NEGATIVE
SARS Coronavirus 2 by RT PCR: NEGATIVE

## 2021-12-27 MED ORDER — ONDANSETRON HCL 4 MG PO TABS: 4.0000 mg | ORAL_TABLET | Freq: Three times a day (TID) | ORAL | 0 refills | Status: DC | PRN

## 2021-12-27 MED ORDER — ONDANSETRON HCL 4 MG/2ML IJ SOLN
4.0000 mg | Freq: Once | INTRAMUSCULAR | Status: AC
  Administered 2021-12-27: 4 mg via INTRAVENOUS
  Filled 2021-12-27: qty 2

## 2021-12-27 MED ORDER — HYDRALAZINE HCL 25 MG PO TABS
100.0000 mg | ORAL_TABLET | Freq: Once | ORAL | Status: AC
  Administered 2021-12-27: 100 mg via ORAL
  Filled 2021-12-27: qty 4

## 2021-12-27 NOTE — ED Provider Notes (Signed)
[facility] Provider Note   CSN: [id number] Arrival date & time: 12/27/21  1604     History  Chief Complaint  Patient presents with   Shortness of Breath    Trach problem   Chest Pain    Maureen Jones is a 56 y.o. female here with c/o 2 days of sob, cough and vomiting. She feels like her trach is clogged. She has suctioned it at home without improvement. She asloc/o cp that comes and goes. It is reproducible with palpation of the chest.  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   Shortness of Breath Associated symptoms: chest pain   Chest Pain Associated symptoms: shortness of breath       Home Medications Prior to Admission medications   Medication Sig Start Date End Date Taking? Authorizing Provider  albuterol (PROVENTIL) (2.5 MG/3ML) 0.083% nebulizer solution Use 1 vial (2.5 mg total) by nebulization every 4 (four) hours as needed for wheezing or shortness of breath. 11/15/21   Elgergawy, Silver Huguenin, MD  amLODipine (NORVASC) 10 MG tablet Take 1 tablet (10 mg total) by mouth daily. 11/16/21   Elgergawy, Silver Huguenin, MD  aspirin EC 81 MG tablet Take 1 tablet (81 mg total) by mouth daily. Swallow whole. 12/17/21 01/16/22  Patrecia Pour, MD  atorvastatin (LIPITOR) 20 MG tablet Take 1 tablet (20 mg total) by mouth daily. 11/16/21   Elgergawy, Silver Huguenin, MD  budesonide (PULMICORT) 0.25 MG/2ML nebulizer solution Use 1 vial (0.25 mg total) by nebulization 2 (two) times daily. 11/15/21   Elgergawy, Silver Huguenin, MD  carvedilol (COREG) 25 MG tablet Take 1 tablet (25 mg total) by mouth 2 (two) times daily. 11/15/21   Elgergawy, Silver Huguenin, MD  clonazePAM (KLONOPIN) 0.5 MG tablet Take 1/2 tablet (0.25 mg total) by mouth daily at bedtime. 11/15/21   Elgergawy, Silver Huguenin, MD  DULoxetine (CYMBALTA) 60 MG capsule Take 1 capsule (60 mg total) by mouth daily. 11/22/21   Nche, Charlene Brooke, NP  hydrALAZINE  (APRESOLINE) 100 MG tablet Take 1 tablet (100 mg total) by mouth 3 (three) times daily. 11/15/21   Elgergawy, Silver Huguenin, MD  insulin lispro (HUMALOG) 100 UNIT/ML KwikPen Inject 16 Units into the skin 3 (three) times daily with meals. 12/17/21   Patrecia Pour, MD  losartan (COZAAR) 50 MG tablet Take 1 tablet (50 mg total) by mouth daily. 11/15/21 12/15/21  Elgergawy, Silver Huguenin, MD  ondansetron (ZOFRAN) 4 MG tablet Take 1 tablet (4 mg total) by mouth every 8 (eight) hours as needed for nausea or vomiting. 12/23/21   Domenic Moras, PA-C  pantoprazole (PROTONIX) 40 MG tablet Take 1 tablet (40 mg total) by mouth 2 (two) times daily. 11/15/21   Elgergawy, Silver Huguenin, MD  promethazine (PHENERGAN) 25 MG tablet Take 1 tablet (25 mg total) by mouth every 6 (six) hours as needed for nausea or vomiting. 12/23/21   Fredia Sorrow, MD  TRESIBA FLEXTOUCH 200 UNIT/ML FlexTouch Pen Inject 32 Units into the skin 2 (two) times daily. 11/22/21   Nche, Charlene Brooke, NP  insulin aspart (NOVOLOG) 100 UNIT/ML injection Insulin (Novolog) sliding scale for additional Novolog: administer 70minutes before meals. Sugar <150 - no units Sugar >151-200 - 4 units Sugar >200-250 - 6 units Sugar >251-300 - 10 units and call office. Patient taking differently: Inject 4-10 Units into the skin See admin instructions. Insulin (Novolog) sliding scale for additional Novolog: administer 59minutes before meals.  Sugar <150 - no units Sugar >151-200 - 4 units Sugar >200-250 - 6 units Sugar >251-300 - 10 units and call office. 11/22/21 12/17/21  Nche, Charlene Brooke, NP      Allergies    Patient has no known allergies.    Review of Systems   Review of Systems  Respiratory:  Positive for shortness of breath.   Cardiovascular:  Positive for chest pain.   Physical Exam Updated Vital Signs BP (!) 151/91 (BP Location: Right Arm)    Pulse 80    Temp 98.4 F (36.9 C) (Oral)    Resp 20    SpO2 100%  Physical Exam Vitals and nursing note reviewed.   Constitutional:      General: She is not in acute distress.    Appearance: She is well-developed. She is not diaphoretic.  HENT:     Head: Normocephalic and atraumatic.     Right Ear: External ear normal.     Left Ear: External ear normal.     Nose: Nose normal.     Mouth/Throat:     Mouth: Mucous membranes are moist.  Eyes:     General: No scleral icterus.    Extraocular Movements: Extraocular movements intact.     Conjunctiva/sclera: Conjunctivae normal.     Pupils: Pupils are equal, round, and reactive to light.  Neck:     Comments: Trach in place Cardiovascular:     Rate and Rhythm: Normal rate and regular rhythm.     Heart sounds: Normal heart sounds. No murmur heard.   No friction rub. No gallop.  Pulmonary:     Effort: No respiratory distress.     Comments: Patient has easy inspiration, difficulty exhaling  Abdominal:     General: Bowel sounds are normal. There is no distension.     Palpations: Abdomen is soft. There is no mass.     Tenderness: There is no abdominal tenderness. There is no guarding.  Skin:    General: Skin is warm and dry.  Neurological:     Mental Status: She is alert and oriented to person, place, and time.  Psychiatric:        Behavior: Behavior normal.    ED Results / Procedures / Treatments   Labs (all labs ordered are listed, but only abnormal results are displayed) Labs Reviewed  CBC WITH DIFFERENTIAL/PLATELET - Abnormal; Notable for the following components:      Result Value   WBC 13.8 (*)    RBC 3.74 (*)    Hemoglobin 10.9 (*)    HCT 33.2 (*)    Neutro Abs 9.5 (*)    All other components within normal limits  COMPREHENSIVE METABOLIC PANEL - Abnormal; Notable for the following components:   Sodium 134 (*)    Glucose, Bld 355 (*)    BUN 29 (*)    Creatinine, Ser 2.33 (*)    Calcium 8.3 (*)    Total Protein 5.0 (*)    Albumin 2.6 (*)    GFR, Estimated 24 (*)    All other components within normal limits  TROPONIN I (HIGH  SENSITIVITY) - Abnormal; Notable for the following components:   Troponin I (High Sensitivity) 22 (*)    All other components within normal limits  RESP PANEL BY RT-PCR (FLU A&B, COVID) ARPGX2  LIPASE, BLOOD  TROPONIN I (HIGH SENSITIVITY)    EKG EKG Interpretation  Date/Time:  Monday December 27 2021 16:24:53 EST Ventricular Rate:  80 PR Interval:  126 QRS Duration:  146 QT Interval:  436 QTC Calculation: 502 R Axis:   -4 Text Interpretation: Normal sinus rhythm Right bundle branch block Abnormal ECG When compared with ECG of 23-Dec-2021 16:03, PREVIOUS ECG IS PRESENT No significant change since last tracing Confirmed by Davonna Belling [phone]) on 12/27/2021 5:47:10 PM  Radiology DG Chest Port 1 View  Result Date: 12/27/2021 CLINICAL DATA:  Cough. EXAM: PORTABLE CHEST 1 VIEW COMPARISON:  Chest radiograph 12/23/2021 FINDINGS: Tracheostomy tube mid trachea. Stable cardiomegaly. Low lung volumes. Bibasilar atelectasis. No large area pulmonary consolidation. No pleural effusion or pneumothorax. IMPRESSION: No acute cardiopulmonary process.  Cardiomegaly. Electronically Signed   By: Lovey Newcomer M.D.   On: 12/27/2021 18:28    Procedures Procedures    Medications Ordered in ED Medications  ondansetron (ZOFRAN) injection 4 mg (has no administration in time range)    ED Course/ Medical Decision Making/ A&P                           Medical Decision Making Amount and/or Complexity of Data Reviewed Labs: ordered. Radiology: ordered.  Risk Prescription drug management.   This patient presents to the ED for concern of sob, this involves an extensive number of treatment options, and is a complaint that carries with it a high risk of complications and morbidity.  The differential diagnosis includes The emergent differential diagnosis for shortness of breath includes, but is not limited to, Pulmonary edema, bronchoconstriction, Pneumonia, Pulmonary embolism, Pneumotherax/ Hemothorax,  Dysrythmia, ACS.  -   Co morbidities that complicate the patient evaluation  new tracheostomy placements, DM, CKD, chronic resp failure   Additional history obtained:  Additional history obtained from Respiratory therapist who knows the patient well.    Lab Tests:  I Ordered, and personally interpreted labs.  The pertinent results include:   Cbc- mild leukocytosis and mild anemia Cmp- glucose elevated, bun, cr at baseline Resp panel- negative Troponin elevatd- baselne- likely due to renal clearance   Imaging Studies ordered:  I ordered imaging studies including port cxr I independently visualized and interpreted imaging which showed  No acute abnormality  I agree with the radiologist interpretation   Cardiac Monitoring:  The patient was maintained on a cardiac monitor.  I personally viewed and interpreted the cardiac monitored which showed an underlying rhythm of:  Nsr at a rate of 80  Medicines ordered and prescription drug management:    Test Considered:    Critical Interventions:  Respiratory management of clogged trach      Problem List / ED Course:  sob- trach tube clogged,- no active vomiting in the ED   Reevaluation:  After the interventions noted above, I reevaluated the patient and found that they have :improved   Social Determinants of Health:  New trach placed - patient uneducated about proper care- I have asked for Close trach clinic follow up given the patient's ED visits   Dispostion:  After consideration of the diagnostic results and the patients response to treatment, I feel that the patent would benefit from OP follow up.     Final Clinical Impression(s) / ED Diagnoses Final diagnoses:  Cough    Rx / DC Orders ED Discharge Orders     None         Margarita Mail, PA-C 12/29/21 1022    Davonna Belling, MD 12/31/21 [phone]

## 2021-12-27 NOTE — ED Notes (Signed)
Called [facility] to transport pt home.

## 2021-12-27 NOTE — ED Triage Notes (Signed)
Pt here via [facility] from home for having trouble w/ trach - pt states it feels clogged - x a few hours. Pt's sister tried to suction trach and only got thin mucus out of it. Pt states this has happened before and last time she had a blood clot in her trach. Pt is having shob and chest pain w/ expiration, 98% on 4L (pt's baseline) w/ trach collar. CBG 400, 18g RAC, 468ml NS given. Pt hasn't been taking diabetes meds over last 2 days because she hasn't been eating, endorses vomiting. 148/74, [id number]

## 2021-12-27 NOTE — ED Notes (Signed)
Notified MD about pts BP. Verified it is still okay to d/c and new orders placed for BP medication.

## 2021-12-27 NOTE — Discharge Instructions (Addendum)
Follow up as soon as possible at the trach clinic

## 2021-12-27 NOTE — ED Notes (Signed)
MD made aware of BP, patient reports that she was unable to take mid-day BP meds and plans to take meds when she gets home.

## 2021-12-28 MED ORDER — CARVEDILOL 12.5 MG PO TABS
25.0000 mg | ORAL_TABLET | Freq: Once | ORAL | Status: AC
  Administered 2021-12-28: 25 mg via ORAL
  Filled 2021-12-28: qty 2

## 2022-01-05 ENCOUNTER — Ambulatory Visit: Payer: Medicare Other | Admitting: Nurse Practitioner

## 2022-01-14 ENCOUNTER — Emergency Department (HOSPITAL_COMMUNITY)
Admission: EM | Admit: 2022-01-14 | Discharge: 2022-01-15 | Disposition: A | Discharge status: Home / Self Care | Payer: Medicare Other | Attending: Emergency Medicine | Admitting: Emergency Medicine

## 2022-01-14 ENCOUNTER — Emergency Department (HOSPITAL_COMMUNITY): Payer: Medicare Other

## 2022-01-14 ENCOUNTER — Other Ambulatory Visit: Payer: Self-pay

## 2022-01-14 DIAGNOSIS — I10 Essential (primary) hypertension: Secondary | ICD-10-CM | POA: Diagnosis not present

## 2022-01-14 DIAGNOSIS — J45909 Unspecified asthma, uncomplicated: Secondary | ICD-10-CM | POA: Diagnosis not present

## 2022-01-14 DIAGNOSIS — R42 Dizziness and giddiness: Secondary | ICD-10-CM | POA: Diagnosis not present

## 2022-01-14 DIAGNOSIS — U071 COVID-19: Secondary | ICD-10-CM | POA: Insufficient documentation

## 2022-01-14 DIAGNOSIS — R0602 Shortness of breath: Secondary | ICD-10-CM | POA: Diagnosis not present

## 2022-01-14 DIAGNOSIS — Z87891 Personal history of nicotine dependence: Secondary | ICD-10-CM | POA: Diagnosis not present

## 2022-01-14 DIAGNOSIS — R531 Weakness: Secondary | ICD-10-CM | POA: Insufficient documentation

## 2022-01-14 DIAGNOSIS — E119 Type 2 diabetes mellitus without complications: Secondary | ICD-10-CM | POA: Insufficient documentation

## 2022-01-14 DIAGNOSIS — M791 Myalgia, unspecified site: Secondary | ICD-10-CM | POA: Diagnosis present

## 2022-01-14 DIAGNOSIS — R739 Hyperglycemia, unspecified: Secondary | ICD-10-CM | POA: Diagnosis not present

## 2022-01-14 LAB — RESP PANEL BY RT-PCR (FLU A&B, COVID) ARPGX2
Influenza A by PCR: NEGATIVE
Influenza B by PCR: NEGATIVE
SARS Coronavirus 2 by RT PCR: POSITIVE — AB

## 2022-01-14 LAB — COMPREHENSIVE METABOLIC PANEL
ALT: 20 U/L (ref 0–44)
AST: 24 U/L (ref 15–41)
Albumin: 2.2 g/dL — ABNORMAL LOW (ref 3.5–5.0)
Alkaline Phosphatase: 121 U/L (ref 38–126)
Anion gap: 8 (ref 5–15)
BUN: 11 mg/dL (ref 6–20)
CO2: 26 mmol/L (ref 22–32)
Calcium: 8.4 mg/dL — ABNORMAL LOW (ref 8.9–10.3)
Chloride: 105 mmol/L (ref 98–111)
Creatinine, Ser: 2.4 mg/dL — ABNORMAL HIGH (ref 0.44–1.00)
GFR, Estimated: 23 mL/min — ABNORMAL LOW (ref >60)
Glucose, Bld: 221 mg/dL — ABNORMAL HIGH (ref 70–99)
Potassium: 3.1 mmol/L — ABNORMAL LOW (ref 3.5–5.1)
Sodium: 139 mmol/L (ref 135–145)
Total Bilirubin: 0.6 mg/dL (ref 0.3–1.2)
Total Protein: 5.4 g/dL — ABNORMAL LOW (ref 6.5–8.1)

## 2022-01-14 LAB — CBC WITH DIFFERENTIAL/PLATELET
Abs Immature Granulocytes: 0.03 10*3/uL (ref 0.00–0.07)
Basophils Absolute: 0 10*3/uL (ref 0.0–0.1)
Basophils Relative: 0 %
Eosinophils Absolute: 0.1 10*3/uL (ref 0.0–0.5)
Eosinophils Relative: 2 %
HCT: 31 % — ABNORMAL LOW (ref 36.0–46.0)
Hemoglobin: 9.8 g/dL — ABNORMAL LOW (ref 12.0–15.0)
Immature Granulocytes: 1 %
Lymphocytes Relative: 37 %
Lymphs Abs: 2.4 10*3/uL (ref 0.7–4.0)
MCH: 28.8 pg (ref 26.0–34.0)
MCHC: 31.6 g/dL (ref 30.0–36.0)
MCV: 91.2 fL (ref 80.0–100.0)
Monocytes Absolute: 0.6 10*3/uL (ref 0.1–1.0)
Monocytes Relative: 8 %
Neutro Abs: 3.4 10*3/uL (ref 1.7–7.7)
Neutrophils Relative %: 52 %
Platelets: 283 10*3/uL (ref 150–400)
RBC: 3.4 MIL/uL — ABNORMAL LOW (ref 3.87–5.11)
RDW: 13.2 % (ref 11.5–15.5)
WBC: 6.5 10*3/uL (ref 4.0–10.5)
nRBC: 0 % (ref 0.0–0.2)

## 2022-01-14 MED ORDER — IPRATROPIUM-ALBUTEROL 0.5-2.5 (3) MG/3ML IN SOLN
3.0000 mL | Freq: Once | RESPIRATORY_TRACT | Status: DC
  Filled 2022-01-14: qty 3

## 2022-01-14 MED ORDER — MOLNUPIRAVIR EUA 200MG CAPSULE: 4.0000 | ORAL_CAPSULE | Freq: Two times a day (BID) | ORAL | 0 refills | Status: AC

## 2022-01-14 MED ORDER — CARVEDILOL 12.5 MG PO TABS
25.0000 mg | ORAL_TABLET | Freq: Once | ORAL | Status: AC
  Administered 2022-01-14: 25 mg via ORAL
  Filled 2022-01-14: qty 2

## 2022-01-14 MED ORDER — IPRATROPIUM-ALBUTEROL 0.5-2.5 (3) MG/3ML IN SOLN
3.0000 mL | Freq: Once | RESPIRATORY_TRACT | Status: AC
  Administered 2022-01-14: 3 mL via RESPIRATORY_TRACT

## 2022-01-14 NOTE — ED Triage Notes (Signed)
Pt here via [facility] for weakness and general malaise 2 days w/ malodorous mucus from tracheostomy. Pt reports having N/V and mucus in diarrhea. Pt denies shob, but reports having a burning sensation in her chest. Pt on 4L O2 venturi mask at baseline.

## 2022-01-14 NOTE — ED Provider Notes (Signed)
[facility] Emergency Department Provider Note MRN:  [id number]  Arrival date & time: 01/15/22     Chief Complaint   Weakness   History of Present Illness   Maureen Jones is a 56 y.o. year-old female with a history of asthma, DM, HTN, sp trach placement presenting to the ED with chief complaint of myalgias, burning in her chest, increased trach secretions.  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   Patient reports that she has symptoms for the previous 2 days. Her symptoms include general malaise, white malodorous mucus production, nausea, and diarrhea. Denies SOB, CP. She is on her baseline 4L of oxygen. Patient reports that she has had sick contacts in the form of her sister. Denies leg swelling, rash, urinary symptoms, fever but admits to chills.  Review of Systems  A thorough review of systems was obtained and all systems are negative except as noted in the HPI and PMH.   Patient's Health History    Past Medical History:  Diagnosis Date   Asthma    Diabetes ([place])    Glottic and subglottic edema 2022   Hypertension     Past Surgical History:  Procedure Laterality Date   TRACHEOSTOMY REVISION  12/16/2021   Procedure: TRACHEOSTOMY EXCHANGE;  Surgeon: Juanito Doom, MD;  Location: [facility] ENDOSCOPY;  Service: Cardiopulmonary;;    Family History  Family history unknown: Yes    Social History   Socioeconomic History   Marital status: Single    Spouse name: Not on file   Number of children: Not on file   Years of education: Not on file   Highest education level: Not on file  Occupational History   Not on file  Tobacco Use   Smoking status: Former    Types: Cigarettes   Smokeless tobacco: Never  Vaping Use   Vaping Use: Never used  Substance and Sexual Activity   Alcohol use: Never   Drug use: Never   Sexual activity: Not Currently  Other Topics Concern   Not on file  Social  History Narrative   Not on file   Social Determinants of Health   Financial Resource Strain: Not on file  Food Insecurity: Not on file  Transportation Needs: Not on file  Physical Activity: Not on file  Stress: Not on file  Social Connections: Not on file  Intimate Partner Violence: Not on file     Physical Exam   Physical Exam Constitutional:      General: She is not in acute distress.    Appearance: She is well-developed. She is obese. She is not ill-appearing or diaphoretic.  HENT:     Head: Normocephalic and atraumatic.     Right Ear: External ear normal.     Left Ear: External ear normal.     Nose: Nose normal.  Cardiovascular:     Rate and Rhythm: Normal rate and regular rhythm.     Pulses: Normal pulses.     Heart sounds: Normal heart sounds.  Pulmonary:     Effort: Pulmonary effort is normal. No respiratory distress.     Breath sounds: Normal breath sounds. No stridor. No wheezing, rhonchi or rales.  Abdominal:     General: Abdomen is flat. There is no distension.     Palpations: Abdomen is soft.     Tenderness: There is no abdominal tenderness.  Musculoskeletal:     Cervical back: Neck supple.  Right lower leg: No edema.     Left lower leg: No edema.  Skin:    General: Skin is warm and dry.     Capillary Refill: Capillary refill takes less than 2 seconds.  Neurological:     General: No focal deficit present.     Mental Status: She is alert and oriented to person, place, and time.     Cranial Nerves: No cranial nerve deficit.     Sensory: No sensory deficit.     Motor: No weakness.      Diagnostic and Interventional Summary    EKG Interpretation  Date/Time:  Friday January 14 2022 21:26:20 EST Ventricular Rate:  98 PR Interval:  140 QRS Duration: 127 QT Interval:  395 QTC Calculation: 505 R Axis:   -16 Text Interpretation: Sinus rhythm Probable left atrial enlargement Right bundle branch block Confirmed by Noemi Chapel [phone]) on 01/14/2022  10:37:05 PM       Labs Reviewed  RESP PANEL BY RT-PCR (FLU A&B, COVID) ARPGX2 - Abnormal; Notable for the following components:      Result Value   SARS Coronavirus 2 by RT PCR POSITIVE (*)    All other components within normal limits  CBC WITH DIFFERENTIAL/PLATELET - Abnormal; Notable for the following components:   RBC 3.40 (*)    Hemoglobin 9.8 (*)    HCT 31.0 (*)    All other components within normal limits  COMPREHENSIVE METABOLIC PANEL - Abnormal; Notable for the following components:   Potassium 3.1 (*)    Glucose, Bld 221 (*)    Creatinine, Ser 2.40 (*)    Calcium 8.4 (*)    Total Protein 5.4 (*)    Albumin 2.2 (*)    GFR, Estimated 23 (*)    All other components within normal limits    DG Chest Portable 1 View  Final Result      Medications  ipratropium-albuterol (DUONEB) 0.5-2.5 (3) MG/3ML nebulizer solution 3 mL (3 mLs Nebulization Not Given 01/14/22 2149)  ipratropium-albuterol (DUONEB) 0.5-2.5 (3) MG/3ML nebulizer solution 3 mL (3 mLs Nebulization Given 01/14/22 2149)  carvedilol (COREG) tablet 25 mg (25 mg Oral Given 01/14/22 2203)     Procedures  /  Critical Care Procedures  ED Course and Medical Decision Making  Initial Impression and Ddx 56 year old female presenting to our emergency department for evaluation of URI symptoms.  Differential diagnosis includes but is not limited to the following: Sinusitis, pneumonia, PMX, viral URI.  Have high suspicion for viral URI given that the patient's sister who cares for her has had similar symptoms including cough.  Will obtain labs and imaging to further evaluate.  Past medical/surgical history that increases complexity of ED encounter: Tracheostomy placed in January  We will also administer 2 DuoNeb's given the patient's past history of asthma despite clear lung fields.  Also the patient reports that she forgot to take her nighttime antihypertensive regimen.  We will administer carvedilol now.  Interpretation of  Diagnostics I personally reviewed the EKG, Chest Xray, and Cardiac Monitor and my interpretation is as follows: EKG reveals that the patient is in normal sinus rhythm without acute ST changes.  Patient remains in sinus rhythm on cardiac monitor.  Chest x-ray negative for acute cardiopulmonary infiltrates.    No leukocytosis or severe anemia noted on CBC.  Patient's CMP was within normal limits.  Patient's creatinine is 2.4.  Patient's baseline creatinine appears to be between 2.0 and 2.5.  Patient was noted to be COVID-positive.  Given that the patient is in a high risk population we will discharge the patient with a prescription for [facility] and instruct the patient to follow-up with her primary care physician.  Patient Reassessment and Ultimate Disposition/Management Will discharge home since the patient is on her home oxygen concentration.  Instructed the patient to return to the emergency department if she comes more short of breath, has increased oxygen requirements, or experiences chest pain.  Patient is in agreement this plan.  Patient management required discussion with the following services or consulting groups:  None  Complexity of Problems Addressed Acute illness or injury that poses threat of life of bodily function  Additional Data Reviewed and Analyzed Further history obtained from: EMS on arrival and Prior ED visit notes  Factors Impacting ED Encounter Risk Prescriptions and Consideration of hospitalization    Final Clinical Impressions(s) / ED Diagnoses     ICD-10-CM   1. COVID-19  U07.1       ED Discharge Orders          Ordered    molnupiravir EUA (LAGEVRIO) 200 mg CAPS capsule  2 times daily        01/14/22 2339             Discharge Instructions Discussed with and Provided to Patient:     Discharge Instructions       You were evaluated in the emergency department for multiple complaints.  You were found to be COVID-positive.  We will  discharge you with a prescription for Molnupiravir.  Please take this medicine twice daily.  Please take 4 capsules at a time.  Medicines sent to [facility]  Please return to our emergency department if you have worsening shortness of breath, chest pain, or feels the need to be reevaluated.  I also recommend vitamin D supplementation.  We can obtain this over-the-counter.          Zachery Dakins, MD 01/15/22 0001    Noemi Chapel, MD 01/15/22 2149

## 2022-01-14 NOTE — Discharge Instructions (Addendum)
°  You were evaluated in the emergency department for multiple complaints.  You were found to be COVID-positive.  We will discharge you with a prescription for Molnupiravir.  Please take this medicine twice daily.  Please take 4 capsules at a time.  Medicines sent to [facility]  Please return to our emergency department if you have worsening shortness of breath, chest pain, or feels the need to be reevaluated.  I also recommend vitamin D supplementation.  We can obtain this over-the-counter.

## 2022-01-15 DIAGNOSIS — Z7401 Bed confinement status: Secondary | ICD-10-CM | POA: Diagnosis not present

## 2022-01-15 DIAGNOSIS — R531 Weakness: Secondary | ICD-10-CM | POA: Diagnosis not present

## 2022-01-15 NOTE — ED Notes (Signed)
The pt needs another shiley  60xlt  uncuffed

## 2022-01-15 NOTE — ED Notes (Signed)
Ptar here

## 2022-01-15 NOTE — ED Notes (Signed)
Pt still waiting for ptar 

## 2022-01-16 DIAGNOSIS — J45909 Unspecified asthma, uncomplicated: Secondary | ICD-10-CM | POA: Diagnosis not present

## 2022-01-16 DIAGNOSIS — Z930 Tracheostomy status: Secondary | ICD-10-CM | POA: Diagnosis not present

## 2022-01-16 DIAGNOSIS — J9600 Acute respiratory failure, unspecified whether with hypoxia or hypercapnia: Secondary | ICD-10-CM | POA: Diagnosis not present

## 2022-01-24 DIAGNOSIS — Z930 Tracheostomy status: Secondary | ICD-10-CM | POA: Diagnosis not present

## 2022-01-25 ENCOUNTER — Encounter: Payer: Self-pay | Admitting: Nurse Practitioner

## 2022-01-26 DIAGNOSIS — R739 Hyperglycemia, unspecified: Secondary | ICD-10-CM | POA: Diagnosis not present

## 2022-01-26 DIAGNOSIS — R000 Tachycardia, unspecified: Secondary | ICD-10-CM | POA: Diagnosis not present

## 2022-01-26 DIAGNOSIS — I10 Essential (primary) hypertension: Secondary | ICD-10-CM | POA: Diagnosis not present

## 2022-01-26 DIAGNOSIS — R059 Cough, unspecified: Secondary | ICD-10-CM | POA: Diagnosis not present

## 2022-01-26 DIAGNOSIS — I4510 Unspecified right bundle-branch block: Secondary | ICD-10-CM | POA: Diagnosis not present

## 2022-01-28 DIAGNOSIS — J383 Other diseases of vocal cords: Secondary | ICD-10-CM | POA: Diagnosis not present

## 2022-01-28 DIAGNOSIS — J45909 Unspecified asthma, uncomplicated: Secondary | ICD-10-CM | POA: Diagnosis not present

## 2022-02-07 ENCOUNTER — Encounter: Payer: Self-pay | Admitting: Nurse Practitioner

## 2022-02-07 ENCOUNTER — Other Ambulatory Visit (HOSPITAL_COMMUNITY): Payer: Self-pay

## 2022-02-08 NOTE — Telephone Encounter (Signed)
Maureen Jones already tried to call

## 2022-02-09 ENCOUNTER — Other Ambulatory Visit (HOSPITAL_COMMUNITY): Payer: Self-pay

## 2022-02-13 ENCOUNTER — Emergency Department (HOSPITAL_COMMUNITY): Payer: Medicare Other

## 2022-02-13 ENCOUNTER — Other Ambulatory Visit: Payer: Self-pay

## 2022-02-13 ENCOUNTER — Inpatient Hospital Stay (HOSPITAL_COMMUNITY)
Admission: EM | Admit: 2022-02-13 | Discharge: 2022-02-17 | DRG: 305 | Disposition: A | Discharge status: Home / Self Care | Payer: Medicare Other | Attending: Internal Medicine | Admitting: Internal Medicine

## 2022-02-13 ENCOUNTER — Encounter (HOSPITAL_COMMUNITY): Payer: Self-pay

## 2022-02-13 DIAGNOSIS — Z20822 Contact with and (suspected) exposure to covid-19: Secondary | ICD-10-CM | POA: Diagnosis present

## 2022-02-13 DIAGNOSIS — D638 Anemia in other chronic diseases classified elsewhere: Secondary | ICD-10-CM | POA: Diagnosis not present

## 2022-02-13 DIAGNOSIS — E785 Hyperlipidemia, unspecified: Secondary | ICD-10-CM | POA: Diagnosis present

## 2022-02-13 DIAGNOSIS — I160 Hypertensive urgency: Secondary | ICD-10-CM | POA: Diagnosis not present

## 2022-02-13 DIAGNOSIS — T447X6A Underdosing of beta-adrenoreceptor antagonists, initial encounter: Secondary | ICD-10-CM | POA: Diagnosis present

## 2022-02-13 DIAGNOSIS — D631 Anemia in chronic kidney disease: Secondary | ICD-10-CM | POA: Diagnosis present

## 2022-02-13 DIAGNOSIS — E860 Dehydration: Secondary | ICD-10-CM | POA: Diagnosis present

## 2022-02-13 DIAGNOSIS — Z87891 Personal history of nicotine dependence: Secondary | ICD-10-CM

## 2022-02-13 DIAGNOSIS — E1122 Type 2 diabetes mellitus with diabetic chronic kidney disease: Secondary | ICD-10-CM | POA: Diagnosis present

## 2022-02-13 DIAGNOSIS — Z930 Tracheostomy status: Secondary | ICD-10-CM

## 2022-02-13 DIAGNOSIS — E1169 Type 2 diabetes mellitus with other specified complication: Secondary | ICD-10-CM

## 2022-02-13 DIAGNOSIS — F411 Generalized anxiety disorder: Secondary | ICD-10-CM

## 2022-02-13 DIAGNOSIS — Z7951 Long term (current) use of inhaled steroids: Secondary | ICD-10-CM | POA: Diagnosis not present

## 2022-02-13 DIAGNOSIS — N12 Tubulo-interstitial nephritis, not specified as acute or chronic: Secondary | ICD-10-CM | POA: Diagnosis present

## 2022-02-13 DIAGNOSIS — Z7985 Long-term (current) use of injectable non-insulin antidiabetic drugs: Secondary | ICD-10-CM

## 2022-02-13 DIAGNOSIS — R1110 Vomiting, unspecified: Secondary | ICD-10-CM | POA: Diagnosis not present

## 2022-02-13 DIAGNOSIS — R000 Tachycardia, unspecified: Secondary | ICD-10-CM | POA: Diagnosis not present

## 2022-02-13 DIAGNOSIS — Z6841 Body Mass Index (BMI) 40.0 and over, adult: Secondary | ICD-10-CM | POA: Diagnosis not present

## 2022-02-13 DIAGNOSIS — I129 Hypertensive chronic kidney disease with stage 1 through stage 4 chronic kidney disease, or unspecified chronic kidney disease: Secondary | ICD-10-CM | POA: Diagnosis not present

## 2022-02-13 DIAGNOSIS — J45909 Unspecified asthma, uncomplicated: Secondary | ICD-10-CM | POA: Diagnosis not present

## 2022-02-13 DIAGNOSIS — R197 Diarrhea, unspecified: Secondary | ICD-10-CM | POA: Diagnosis not present

## 2022-02-13 DIAGNOSIS — E1165 Type 2 diabetes mellitus with hyperglycemia: Secondary | ICD-10-CM | POA: Diagnosis present

## 2022-02-13 DIAGNOSIS — J9610 Chronic respiratory failure, unspecified whether with hypoxia or hypercapnia: Secondary | ICD-10-CM | POA: Diagnosis present

## 2022-02-13 DIAGNOSIS — N185 Chronic kidney disease, stage 5: Secondary | ICD-10-CM | POA: Diagnosis present

## 2022-02-13 DIAGNOSIS — N184 Chronic kidney disease, stage 4 (severe): Secondary | ICD-10-CM | POA: Diagnosis present

## 2022-02-13 DIAGNOSIS — E119 Type 2 diabetes mellitus without complications: Secondary | ICD-10-CM

## 2022-02-13 DIAGNOSIS — T383X6A Underdosing of insulin and oral hypoglycemic [antidiabetic] drugs, initial encounter: Secondary | ICD-10-CM | POA: Diagnosis present

## 2022-02-13 DIAGNOSIS — R112 Nausea with vomiting, unspecified: Secondary | ICD-10-CM | POA: Diagnosis present

## 2022-02-13 DIAGNOSIS — Z7401 Bed confinement status: Secondary | ICD-10-CM | POA: Diagnosis not present

## 2022-02-13 DIAGNOSIS — T465X6A Underdosing of other antihypertensive drugs, initial encounter: Secondary | ICD-10-CM | POA: Diagnosis present

## 2022-02-13 DIAGNOSIS — Z794 Long term (current) use of insulin: Secondary | ICD-10-CM | POA: Diagnosis not present

## 2022-02-13 DIAGNOSIS — R059 Cough, unspecified: Secondary | ICD-10-CM | POA: Diagnosis not present

## 2022-02-13 DIAGNOSIS — Z9114 Patient's other noncompliance with medication regimen: Secondary | ICD-10-CM | POA: Diagnosis not present

## 2022-02-13 DIAGNOSIS — R109 Unspecified abdominal pain: Secondary | ICD-10-CM | POA: Diagnosis not present

## 2022-02-13 DIAGNOSIS — A084 Viral intestinal infection, unspecified: Secondary | ICD-10-CM | POA: Diagnosis not present

## 2022-02-13 DIAGNOSIS — J9600 Acute respiratory failure, unspecified whether with hypoxia or hypercapnia: Secondary | ICD-10-CM | POA: Diagnosis not present

## 2022-02-13 DIAGNOSIS — T461X6A Underdosing of calcium-channel blockers, initial encounter: Secondary | ICD-10-CM | POA: Diagnosis present

## 2022-02-13 DIAGNOSIS — Z79899 Other long term (current) drug therapy: Secondary | ICD-10-CM | POA: Diagnosis not present

## 2022-02-13 DIAGNOSIS — G4733 Obstructive sleep apnea (adult) (pediatric): Secondary | ICD-10-CM | POA: Diagnosis present

## 2022-02-13 DIAGNOSIS — N179 Acute kidney failure, unspecified: Secondary | ICD-10-CM | POA: Diagnosis not present

## 2022-02-13 DIAGNOSIS — J383 Other diseases of vocal cords: Secondary | ICD-10-CM | POA: Diagnosis present

## 2022-02-13 DIAGNOSIS — R609 Edema, unspecified: Secondary | ICD-10-CM | POA: Diagnosis not present

## 2022-02-13 DIAGNOSIS — K529 Noninfective gastroenteritis and colitis, unspecified: Secondary | ICD-10-CM

## 2022-02-13 DIAGNOSIS — R531 Weakness: Secondary | ICD-10-CM | POA: Diagnosis not present

## 2022-02-13 LAB — CBC WITH DIFFERENTIAL/PLATELET
Abs Immature Granulocytes: 0.08 10*3/uL — ABNORMAL HIGH (ref 0.00–0.07)
Basophils Absolute: 0 10*3/uL (ref 0.0–0.1)
Basophils Relative: 0 %
Eosinophils Absolute: 0 10*3/uL (ref 0.0–0.5)
Eosinophils Relative: 0 %
HCT: 35 % — ABNORMAL LOW (ref 36.0–46.0)
Hemoglobin: 11.5 g/dL — ABNORMAL LOW (ref 12.0–15.0)
Immature Granulocytes: 1 %
Lymphocytes Relative: 9 %
Lymphs Abs: 1.4 10*3/uL (ref 0.7–4.0)
MCH: 29.3 pg (ref 26.0–34.0)
MCHC: 32.9 g/dL (ref 30.0–36.0)
MCV: 89.3 fL (ref 80.0–100.0)
Monocytes Absolute: 0.5 10*3/uL (ref 0.1–1.0)
Monocytes Relative: 4 %
Neutro Abs: 12.5 10*3/uL — ABNORMAL HIGH (ref 1.7–7.7)
Neutrophils Relative %: 86 %
Platelets: 307 10*3/uL (ref 150–400)
RBC: 3.92 MIL/uL (ref 3.87–5.11)
RDW: 14.2 % (ref 11.5–15.5)
WBC: 14.5 10*3/uL — ABNORMAL HIGH (ref 4.0–10.5)
nRBC: 0 % (ref 0.0–0.2)

## 2022-02-13 LAB — COMPREHENSIVE METABOLIC PANEL
ALT: 12 U/L (ref 0–44)
AST: 18 U/L (ref 15–41)
Albumin: 2.7 g/dL — ABNORMAL LOW (ref 3.5–5.0)
Alkaline Phosphatase: 125 U/L (ref 38–126)
Anion gap: 14 (ref 5–15)
BUN: 23 mg/dL — ABNORMAL HIGH (ref 6–20)
CO2: 23 mmol/L (ref 22–32)
Calcium: 9.7 mg/dL (ref 8.9–10.3)
Chloride: 99 mmol/L (ref 98–111)
Creatinine, Ser: 2.69 mg/dL — ABNORMAL HIGH (ref 0.44–1.00)
GFR, Estimated: 20 mL/min — ABNORMAL LOW (ref >60)
Glucose, Bld: 370 mg/dL — ABNORMAL HIGH (ref 70–99)
Potassium: 3.8 mmol/L (ref 3.5–5.1)
Sodium: 136 mmol/L (ref 135–145)
Total Bilirubin: 0.3 mg/dL (ref 0.3–1.2)
Total Protein: 6.2 g/dL — ABNORMAL LOW (ref 6.5–8.1)

## 2022-02-13 LAB — URINALYSIS, ROUTINE W REFLEX MICROSCOPIC
Bilirubin Urine: NEGATIVE
Glucose, UA: >=500 mg/dL — AB
Ketones, ur: 15 mg/dL — AB
Leukocytes,Ua: NEGATIVE
Nitrite: NEGATIVE
Protein, ur: >300 mg/dL — AB
Specific Gravity, Urine: 1.015 (ref 1.005–1.030)
pH: 7.5 (ref 5.0–8.0)

## 2022-02-13 LAB — URINALYSIS, MICROSCOPIC (REFLEX)

## 2022-02-13 LAB — RESP PANEL BY RT-PCR (FLU A&B, COVID) ARPGX2
Influenza A by PCR: NEGATIVE
Influenza B by PCR: NEGATIVE
SARS Coronavirus 2 by RT PCR: NEGATIVE

## 2022-02-13 LAB — LIPASE, BLOOD: Lipase: 31 U/L (ref 11–51)

## 2022-02-13 LAB — TROPONIN I (HIGH SENSITIVITY): Troponin I (High Sensitivity): 43 ng/L — ABNORMAL HIGH (ref <18)

## 2022-02-13 MED ORDER — ALBUTEROL SULFATE (2.5 MG/3ML) 0.083% IN NEBU: 2.5000 mg | INHALATION_SOLUTION | RESPIRATORY_TRACT | Status: DC | PRN

## 2022-02-13 MED ORDER — AMLODIPINE BESYLATE 10 MG PO TABS
10.0000 mg | ORAL_TABLET | Freq: Every day | ORAL | Status: DC
  Administered 2022-02-14 – 2022-02-17 (×4): 10 mg via ORAL
  Filled 2022-02-13: qty 2
  Filled 2022-02-13 (×3): qty 1

## 2022-02-13 MED ORDER — HYDRALAZINE HCL 25 MG PO TABS
100.0000 mg | ORAL_TABLET | Freq: Three times a day (TID) | ORAL | Status: DC
  Administered 2022-02-14 – 2022-02-17 (×11): 100 mg via ORAL
  Filled 2022-02-13 (×11): qty 4

## 2022-02-13 MED ORDER — LABETALOL HCL 5 MG/ML IV SOLN
10.0000 mg | INTRAVENOUS | Status: DC | PRN
  Administered 2022-02-14 – 2022-02-17 (×7): 10 mg via INTRAVENOUS
  Filled 2022-02-13 (×8): qty 4

## 2022-02-13 MED ORDER — HYDRALAZINE HCL 20 MG/ML IJ SOLN
10.0000 mg | Freq: Once | INTRAMUSCULAR | Status: AC
  Administered 2022-02-13: 10 mg via INTRAVENOUS
  Filled 2022-02-13: qty 1

## 2022-02-13 MED ORDER — ENOXAPARIN SODIUM 40 MG/0.4ML IJ SOSY
40.0000 mg | PREFILLED_SYRINGE | INTRAMUSCULAR | Status: DC
  Administered 2022-02-14 – 2022-02-17 (×4): 40 mg via SUBCUTANEOUS
  Filled 2022-02-13 (×4): qty 0.4

## 2022-02-13 MED ORDER — INSULIN ASPART 100 UNIT/ML IJ SOLN
0.0000 [IU] | Freq: Three times a day (TID) | INTRAMUSCULAR | Status: DC
  Administered 2022-02-14: 3 [IU] via SUBCUTANEOUS
  Administered 2022-02-14: 9 [IU] via SUBCUTANEOUS
  Administered 2022-02-14: 3 [IU] via SUBCUTANEOUS
  Administered 2022-02-15 (×2): 2 [IU] via SUBCUTANEOUS

## 2022-02-13 MED ORDER — SODIUM CHLORIDE 0.9 % IV BOLUS
1000.0000 mL | Freq: Once | INTRAVENOUS | Status: AC
  Administered 2022-02-13: 1000 mL via INTRAVENOUS

## 2022-02-13 MED ORDER — CEFTRIAXONE SODIUM 2 G IJ SOLR
2.0000 g | INTRAMUSCULAR | Status: DC
  Administered 2022-02-14 – 2022-02-16 (×3): 2 g via INTRAVENOUS
  Filled 2022-02-13 (×3): qty 20

## 2022-02-13 MED ORDER — BUDESONIDE 0.25 MG/2ML IN SUSP
0.2500 mg | Freq: Two times a day (BID) | RESPIRATORY_TRACT | Status: DC
  Administered 2022-02-15 – 2022-02-17 (×5): 0.25 mg via RESPIRATORY_TRACT
  Filled 2022-02-13 (×5): qty 2

## 2022-02-13 MED ORDER — SODIUM CHLORIDE 0.9 % IV SOLN
12.5000 mg | Freq: Four times a day (QID) | INTRAVENOUS | Status: DC | PRN
  Administered 2022-02-14 – 2022-02-16 (×3): 12.5 mg via INTRAVENOUS
  Filled 2022-02-13 (×3): qty 12.5
  Filled 2022-02-13: qty 0.5

## 2022-02-13 MED ORDER — DULOXETINE HCL 60 MG PO CPEP
60.0000 mg | ORAL_CAPSULE | Freq: Every day | ORAL | Status: DC
Start: 2022-02-14 — End: 2022-02-17
  Administered 2022-02-14 – 2022-02-17 (×4): 60 mg via ORAL
  Filled 2022-02-13 (×4): qty 1

## 2022-02-13 MED ORDER — ONDANSETRON HCL 4 MG/2ML IJ SOLN
4.0000 mg | Freq: Once | INTRAMUSCULAR | Status: AC
  Administered 2022-02-13: 4 mg via INTRAVENOUS
  Filled 2022-02-13: qty 2

## 2022-02-13 MED ORDER — INSULIN GLARGINE-YFGN 100 UNIT/ML ~~LOC~~ SOLN
25.0000 [IU] | Freq: Every day | SUBCUTANEOUS | Status: DC
  Administered 2022-02-14 (×2): 25 [IU] via SUBCUTANEOUS
  Filled 2022-02-13 (×4): qty 0.25

## 2022-02-13 MED ORDER — CARVEDILOL 12.5 MG PO TABS: 25.0000 mg | ORAL_TABLET | Freq: Once | ORAL | Status: DC

## 2022-02-13 MED ORDER — ATORVASTATIN CALCIUM 10 MG PO TABS
20.0000 mg | ORAL_TABLET | Freq: Every day | ORAL | Status: DC
  Administered 2022-02-14 – 2022-02-17 (×4): 20 mg via ORAL
  Filled 2022-02-13 (×4): qty 2

## 2022-02-13 MED ORDER — CARVEDILOL 25 MG PO TABS
25.0000 mg | ORAL_TABLET | Freq: Two times a day (BID) | ORAL | Status: DC
  Administered 2022-02-14 – 2022-02-17 (×8): 25 mg via ORAL
  Filled 2022-02-13: qty 2
  Filled 2022-02-13 (×2): qty 1
  Filled 2022-02-13: qty 2
  Filled 2022-02-13 (×2): qty 1
  Filled 2022-02-13: qty 8
  Filled 2022-02-13: qty 1

## 2022-02-13 MED ORDER — METRONIDAZOLE 500 MG/100ML IV SOLN
500.0000 mg | Freq: Two times a day (BID) | INTRAVENOUS | Status: DC
  Administered 2022-02-13 – 2022-02-17 (×8): 500 mg via INTRAVENOUS
  Filled 2022-02-13 (×8): qty 100

## 2022-02-13 MED ORDER — SODIUM CHLORIDE 0.9 % IV SOLN
1.0000 g | Freq: Once | INTRAVENOUS | Status: AC
  Administered 2022-02-13: 1 g via INTRAVENOUS
  Filled 2022-02-13: qty 10

## 2022-02-13 MED ORDER — SODIUM CHLORIDE 0.9 % IV SOLN
25.0000 mg | Freq: Four times a day (QID) | INTRAVENOUS | Status: DC | PRN
  Filled 2022-02-13: qty 1

## 2022-02-13 MED ORDER — LABETALOL HCL 5 MG/ML IV SOLN
10.0000 mg | Freq: Once | INTRAVENOUS | Status: AC
  Administered 2022-02-13: 10 mg via INTRAVENOUS
  Filled 2022-02-13: qty 4

## 2022-02-13 MED ORDER — AMLODIPINE BESYLATE 5 MG PO TABS: 10.0000 mg | ORAL_TABLET | Freq: Once | ORAL | Status: DC

## 2022-02-13 NOTE — Progress Notes (Signed)
RT called to suction trach. Patient has #6XLT cuffless. Non-productive with suction. DIC clean. Patient on 2L O2. ?

## 2022-02-13 NOTE — ED Triage Notes (Signed)
Pt BIB [facility] from home after vomiting/diarrhea since Friday. Pt states that she was here 2 days ago and the medication she was prescribed didn't help her symptoms. Pt has been out of BP meds x2 weeks. Trach at baseline.  ?

## 2022-02-13 NOTE — H&P (Signed)
History and Physical    Maureen Jones EXN:[id number] DOB: 03-08-1966 DOA: 02/13/2022  PCP: Flossie Buffy, NP  Patient coming from: Home.  Chief Complaint: Nausea vomiting and diarrhea.  HPI: Maureen Jones is a 56 y.o. female with history of diabetes mellitus type 2, chronic kidney disease stage IV, hypertension, chronic respiratory failure with tracheostomy secondary to subglottic edema and vocal cord dysfunction recently was admitted and intubated for respiratory failure in the setting of subglottic edema and hypertensive crisis presents to the ER with nausea vomiting and abdominal discomfort with diarrhea for the last 3 days.  Denies any blood in the vomitus or diarrhea.  Patient states she has not taken her medication for the last 2 weeks since she did not get refill and had to go to her primary care physician before getting refills.  ED Course: In the ER patient is found to have markedly elevated blood pressure with systolic more than 496.  Tachycardic.  CT abdomen pelvis done shows perinephric stranding and also features concerning for colitis.  For blood pressure patient was given IV labetalol and restarted her home medication.  Also was started on empiric antibiotics for UTI and colitis.  High sensitive troponin 43.  EKG shows sinus tachycardia with RBBB.  Chest x-ray unremarkable.  COVID test negative.  Review of Systems: As per HPI, rest all negative.   Past Medical History:  Diagnosis Date   Asthma    Diabetes ([place])    Glottic and subglottic edema 2022   Hypertension     Past Surgical History:  Procedure Laterality Date   TRACHEOSTOMY REVISION  12/16/2021   Procedure: TRACHEOSTOMY EXCHANGE;  Surgeon: Juanito Doom, MD;  Location: [facility] ENDOSCOPY;  Service: Cardiopulmonary;;     reports that she has quit smoking. Her smoking use included cigarettes. She has never used smokeless tobacco. She reports that she does not drink alcohol and does not use drugs.  No Known  Allergies  Family History  Family history unknown: Yes    Prior to Admission medications   Medication Sig Start Date End Date Taking? Authorizing Provider  albuterol (PROVENTIL) (2.5 MG/3ML) 0.083% nebulizer solution Use 1 vial (2.5 mg total) by nebulization every 4 (four) hours as needed for wheezing or shortness of breath. 11/15/21  Yes Elgergawy, Silver Huguenin, MD  amLODipine (NORVASC) 10 MG tablet Take 1 tablet (10 mg total) by mouth daily. 11/16/21  Yes Elgergawy, Silver Huguenin, MD  atorvastatin (LIPITOR) 20 MG tablet Take 1 tablet (20 mg total) by mouth daily. 11/16/21  Yes Elgergawy, Silver Huguenin, MD  budesonide (PULMICORT) 0.25 MG/2ML nebulizer solution Use 1 vial (0.25 mg total) by nebulization 2 (two) times daily. 11/15/21  Yes Elgergawy, Silver Huguenin, MD  carvedilol (COREG) 25 MG tablet Take 1 tablet (25 mg total) by mouth 2 (two) times daily. 11/15/21  Yes Elgergawy, Silver Huguenin, MD  clonazePAM (KLONOPIN) 0.5 MG tablet Take 1/2 tablet (0.25 mg total) by mouth daily at bedtime. 11/15/21  Yes Elgergawy, Silver Huguenin, MD  DULoxetine (CYMBALTA) 60 MG capsule Take 1 capsule (60 mg total) by mouth daily. 11/22/21  Yes Nche, Charlene Brooke, NP  hydrALAZINE (APRESOLINE) 100 MG tablet Take 1 tablet (100 mg total) by mouth 3 (three) times daily. 11/15/21  Yes Elgergawy, Silver Huguenin, MD  insulin lispro (HUMALOG) 100 UNIT/ML KwikPen Inject 16 Units into the skin 3 (three) times daily with meals. 12/17/21  Yes Patrecia Pour, MD  losartan (COZAAR) 50 MG tablet Take 1 tablet (50 mg total) by mouth  daily. 11/15/21 02/13/22 Yes Elgergawy, Silver Huguenin, MD  ondansetron (ZOFRAN) 4 MG tablet Take 1 tablet (4 mg total) by mouth every 8 (eight) hours as needed for nausea or vomiting. 12/27/21  Yes Harris, Abigail, PA-C  pantoprazole (PROTONIX) 40 MG tablet Take 1 tablet (40 mg total) by mouth 2 (two) times daily. 11/15/21  Yes Elgergawy, Silver Huguenin, MD  promethazine (PHENERGAN) 25 MG tablet Take 1 tablet (25 mg total) by mouth every 6 (six) hours as  needed for nausea or vomiting. 12/23/21  Yes Fredia Sorrow, MD  TRESIBA FLEXTOUCH 200 UNIT/ML FlexTouch Pen Inject 32 Units into the skin 2 (two) times daily. 11/22/21  Yes Nche, Charlene Brooke, NP  insulin aspart (NOVOLOG) 100 UNIT/ML injection Insulin (Novolog) sliding scale for additional Novolog: administer 64minutes before meals. Sugar <150 - no units Sugar >151-200 - 4 units Sugar >200-250 - 6 units Sugar >251-300 - 10 units and call office. Patient taking differently: Inject 4-10 Units into the skin See admin instructions. Insulin (Novolog) sliding scale for additional Novolog: administer 61minutes before meals. Sugar <150 - no units Sugar >151-200 - 4 units Sugar >200-250 - 6 units Sugar >251-300 - 10 units and call office. 11/22/21 12/17/21  Nche, Charlene Brooke, NP    Physical Exam: Constitutional: Moderately built and nourished. Vitals:   02/13/22 2215 02/13/22 2230 02/13/22 2245 02/13/22 2300  BP: (!) 204/100 (!) 216/106 (!) 211/104 (!) 200/110  Pulse: (!) 116 (!) 122 (!) 118 (!) 110  Resp: 19 17 19  (!) 21  Temp:      TempSrc:      SpO2: 99% 100% 100% 100%  Weight:      Height:       Eyes: Anicteric no pallor. ENMT: Tracheostomy seen.  No discharge. Neck: Tracheostomy seen.  No discharge. Respiratory: No rhonchi or crepitations. Cardiovascular: S1-S2 heard. Abdomen: Soft mild tenderness mostly in the left upper and lower quadrant. Musculoskeletal: Mild edema both lower extremity. Skin: No rash. Neurologic: Alert awake oriented time place and person.  Moves all extremities. Psychiatric: Appears normal.  Normal affect.   Labs on Admission: I have personally reviewed following labs and imaging studies  CBC: Recent Labs  Lab 02/13/22 2100  WBC 14.5*  NEUTROABS 12.5*  HGB 11.5*  HCT 35.0*  MCV 89.3  PLT 962   Basic Metabolic Panel: Recent Labs  Lab 02/13/22 2100  NA 136  K 3.8  CL 99  CO2 23  GLUCOSE 370*  BUN 23*  CREATININE 2.69*  CALCIUM 9.7    GFR: Estimated Creatinine Clearance: 30.5 mL/min (A) (by C-G formula based on SCr of 2.69 mg/dL (H)). Liver Function Tests: Recent Labs  Lab 02/13/22 2100  AST 18  ALT 12  ALKPHOS 125  BILITOT 0.3  PROT 6.2*  ALBUMIN 2.7*   Recent Labs  Lab 02/13/22 2100  LIPASE 31   No results for input(s): AMMONIA in the last 168 hours. Coagulation Profile: No results for input(s): INR, PROTIME in the last 168 hours. Cardiac Enzymes: No results for input(s): CKTOTAL, CKMB, CKMBINDEX, TROPONINI in the last 168 hours. BNP (last 3 results) No results for input(s): PROBNP in the last 8760 hours. HbA1C: No results for input(s): HGBA1C in the last 72 hours. CBG: No results for input(s): GLUCAP in the last 168 hours. Lipid Profile: No results for input(s): CHOL, HDL, LDLCALC, TRIG, CHOLHDL, LDLDIRECT in the last 72 hours. Thyroid Function Tests: No results for input(s): TSH, T4TOTAL, FREET4, T3FREE, THYROIDAB in the last 72 hours. Anemia Panel:  No results for input(s): VITAMINB12, FOLATE, FERRITIN, TIBC, IRON, RETICCTPCT in the last 72 hours. Urine analysis:    Component Value Date/Time   COLORURINE YELLOW 02/13/2022 2256   APPEARANCEUR HAZY (A) 02/13/2022 2256   LABSPEC 1.015 02/13/2022 2256   PHURINE 7.5 02/13/2022 2256   GLUCOSEU >=500 (A) 02/13/2022 2256   HGBUR MODERATE (A) 02/13/2022 2256   BILIRUBINUR NEGATIVE 02/13/2022 2256   KETONESUR 15 (A) 02/13/2022 2256   PROTEINUR >300 (A) 02/13/2022 2256   NITRITE NEGATIVE 02/13/2022 2256   LEUKOCYTESUR NEGATIVE 02/13/2022 2256   Sepsis Labs: @LABRCNTIP (procalcitonin:4,lacticidven:4) ) Recent Results (from the past 240 hour(s))  Resp Panel by RT-PCR (Flu A&B, Covid) Nasopharyngeal Swab     Status: None   Collection Time: 02/13/22  9:26 PM   Specimen: Nasopharyngeal Swab; Nasopharyngeal(NP) swabs in vial transport medium  Result Value Ref Range Status   SARS Coronavirus 2 by RT PCR NEGATIVE NEGATIVE Final    Comment:  (NOTE) SARS-CoV-2 target nucleic acids are NOT DETECTED.  The SARS-CoV-2 RNA is generally detectable in upper respiratory specimens during the acute phase of infection. The lowest concentration of SARS-CoV-2 viral copies this assay can detect is 138 copies/mL. A negative result does not preclude SARS-Cov-2 infection and should not be used as the sole basis for treatment or other patient management decisions. A negative result may occur with  improper specimen collection/handling, submission of specimen other than nasopharyngeal swab, presence of viral mutation(s) within the areas targeted by this assay, and inadequate number of viral copies(<138 copies/mL). A negative result must be combined with clinical observations, patient history, and epidemiological information. The expected result is Negative.  Fact Sheet for Patients:  EntrepreneurPulse.com.au  Fact Sheet for Healthcare Providers:  IncredibleEmployment.be  This test is no t yet approved or cleared by the [place] FDA and  has been authorized for detection and/or diagnosis of SARS-CoV-2 by FDA under an Emergency Use Authorization (EUA). This EUA will remain  in effect (meaning this test can be used) for the duration of the COVID-19 declaration under Section 564(b)(1) of the Act, 21 U.S.C.section 360bbb-3(b)(1), unless the authorization is terminated  or revoked sooner.       Influenza A by PCR NEGATIVE NEGATIVE Final   Influenza B by PCR NEGATIVE NEGATIVE Final    Comment: (NOTE) The Xpert Xpress SARS-CoV-2/FLU/RSV plus assay is intended as an aid in the diagnosis of influenza from Nasopharyngeal swab specimens and should not be used as a sole basis for treatment. Nasal washings and aspirates are unacceptable for Xpert Xpress SARS-CoV-2/FLU/RSV testing.  Fact Sheet for Patients: EntrepreneurPulse.com.au  Fact Sheet for Healthcare  Providers: IncredibleEmployment.be  This test is not yet approved or cleared by the [place] FDA and has been authorized for detection and/or diagnosis of SARS-CoV-2 by FDA under an Emergency Use Authorization (EUA). This EUA will remain in effect (meaning this test can be used) for the duration of the COVID-19 declaration under Section 564(b)(1) of the Act, 21 U.S.C. section 360bbb-3(b)(1), unless the authorization is terminated or revoked.  Performed at [facility], [place] [address] [place], [place] [address]      Radiological Exams on Admission: CT ABDOMEN PELVIS WO CONTRAST  Result Date: 02/13/2022 CLINICAL DATA:  Acute abdominal pain.  Vomiting and diarrhea. EXAM: CT ABDOMEN AND PELVIS WITHOUT CONTRAST TECHNIQUE: Multidetector CT imaging of the abdomen and pelvis was performed following the standard protocol without IV contrast. RADIATION DOSE REDUCTION: This exam was performed according to the departmental dose-optimization program which  includes automated exposure control, adjustment of the mA and/or kV according to patient size and/or use of iterative reconstruction technique. COMPARISON:  None. FINDINGS: Lower chest: No acute abnormality. Hepatobiliary: No focal liver abnormality is seen. No gallstones, gallbladder wall thickening, or biliary dilatation. Pancreas: Unremarkable. No pancreatic ductal dilatation or surrounding inflammatory changes. Spleen: Normal in size without focal abnormality. Adrenals/Urinary Tract: There is no evidence for hydronephrosis or urinary tract calculus. Kidneys are normal in size. There is bilateral periureteral and perinephric stranding. Bladder is within normal limits. Adrenal glands are within normal limits. Stomach/Bowel: Stomach is within normal limits. Appendix appears normal. No evidence for bowel obstruction or free air. There is stranding and fluid surrounding the descending colon concerning for colitis.  Vascular/Lymphatic: No significant vascular findings are present. No enlarged abdominal or pelvic lymph nodes. Reproductive: Status post hysterectomy. No adnexal masses. Other: There is mild stranding and fluid tracking along the bilateral retroperitoneal regions. There is no free air or focal abdominal wall hernia. There is mild body wall edema. Musculoskeletal: No acute or significant osseous findings. IMPRESSION: 1. Bilateral perinephric and Peri ureteral stranding and fluid may be related to inflammatory or infectious process. No urinary tract calculus or hydronephrosis. 2. Fluid and stranding surrounding the descending colon concerning for nonspecific colitis. 3. Mild body wall edema. Electronically Signed   By: Ronney Asters M.D.   On: 02/13/2022 21:53   DG Chest Port 1 View  Result Date: 02/13/2022 CLINICAL DATA:  cough EXAM: PORTABLE CHEST 1 VIEW COMPARISON:  January 14, 2022 FINDINGS: The cardiomediastinal silhouette is unchanged in contour.Tracheostomy. No pleural effusion. No pneumothorax. Low lung volumes. No acute pleuroparenchymal abnormality. Visualized abdomen is unremarkable. IMPRESSION: No acute cardiopulmonary abnormality. Electronically Signed   By: Valentino Saxon M.D.   On: 02/13/2022 21:00    EKG: Independently reviewed.  Sinus tachycardia RBBB.  Assessment/Plan Principal Problem:   Hypertensive urgency Active Problems:   Vocal cord dysfunction   OSA (obstructive sleep apnea)   DM (diabetes mellitus) (HCC)   CKD (chronic kidney disease) stage 4, GFR 15-29 ml/min (HCC)   Nausea vomiting and diarrhea    Hypertensive urgency -patient had ran out of her medications for the last 2 weeks and likely causing it.  Will restart patient's Coreg amlodipine and hydralazine.  Patient also takes ARB which she will not start now because of the worsening renal function.  We will keep patient on as needed IV hydralazine follow blood pressure trends.  If blood pressure does not improve may  need antihypertensive infusions. Nausea vomiting and diarrhea with abdominal discomfort likely from possible pyelonephritis and colitis.  Check stool studies.  Continue with empiric antibiotics presently on ceftriaxone and Flagyl.  Follow cultures. Diabetes mellitus type 2 with hyperglycemia secondary patient not taking a long-acting insulin for 2 weeks.  We will keep patient on Lantus 20 units at bedtime follow sliding scale coverage. History of subglottic edema and vocal cord dysfunction status post tracheostomy.  Closely monitor. Chronic kidney disease stage IV with mild worsening of creatinine.  Patient on exam also has lower extremity edema.  Closely monitor intake output metabolic panel. Lower extremity edema more on the left side.  Check Dopplers to rule out DVT. Anemia likely from renal disease follow CBC.  Since patient has hypertensive urgency with worsening renal function colitis possible pyelonephritis will need close monitoring and inpatient status.   DVT prophylaxis: Heparin. Code Status: Full code. Family Communication: Discussed with patient. Disposition Plan: Home. Consults called: None. Admission status: Inpatient.  Rise Patience MD [facility] Pager [phone].  If 7PM-7AM, please contact night-coverage [email or web address] Password [facility]  02/13/2022, 11:34 PM

## 2022-02-13 NOTE — ED Provider Notes (Signed)
[facility] EMERGENCY DEPARTMENT Provider Note   CSN: [id number] Arrival date & time: 02/13/22  2030     History  Chief Complaint  Patient presents with   Emesis   Nausea   Diarrhea    Maureen Jones is a 56 y.o. female history of difficult to control hypertension, laryngeal edema requiring intubation and now trach here presenting with vomiting and diarrhea and abdominal pain.  Patient states that she has been vomiting for the last 2 to 3 days.  She states that she was prescribed Phenergan but it did not help her with her vomiting.  Patient states that she has diffuse abdominal cramps as well.  Patient also has increased secretion from her trach.  Patient also ran out of her blood pressure medicine for the last 2 weeks.  I was able to review her note from previous.  Patient had hypertensive urgency and eventually had laryngeal edema and required intubation and eventually a trach  The history is provided by the patient.      Home Medications Prior to Admission medications   Medication Sig Start Date End Date Taking? Authorizing Provider  albuterol (PROVENTIL) (2.5 MG/3ML) 0.083% nebulizer solution Use 1 vial (2.5 mg total) by nebulization every 4 (four) hours as needed for wheezing or shortness of breath. 11/15/21  Yes Elgergawy, Silver Huguenin, MD  amLODipine (NORVASC) 10 MG tablet Take 1 tablet (10 mg total) by mouth daily. 11/16/21  Yes Elgergawy, Silver Huguenin, MD  atorvastatin (LIPITOR) 20 MG tablet Take 1 tablet (20 mg total) by mouth daily. 11/16/21  Yes Elgergawy, Silver Huguenin, MD  budesonide (PULMICORT) 0.25 MG/2ML nebulizer solution Use 1 vial (0.25 mg total) by nebulization 2 (two) times daily. 11/15/21  Yes Elgergawy, Silver Huguenin, MD  carvedilol (COREG) 25 MG tablet Take 1 tablet (25 mg total) by mouth 2 (two) times daily. 11/15/21  Yes Elgergawy, Silver Huguenin, MD  clonazePAM (KLONOPIN) 0.5 MG tablet Take 1/2 tablet (0.25 mg total) by mouth daily at bedtime. 11/15/21  Yes Elgergawy,  Silver Huguenin, MD  DULoxetine (CYMBALTA) 60 MG capsule Take 1 capsule (60 mg total) by mouth daily. 11/22/21  Yes Nche, Charlene Brooke, NP  hydrALAZINE (APRESOLINE) 100 MG tablet Take 1 tablet (100 mg total) by mouth 3 (three) times daily. 11/15/21  Yes Elgergawy, Silver Huguenin, MD  insulin lispro (HUMALOG) 100 UNIT/ML KwikPen Inject 16 Units into the skin 3 (three) times daily with meals. 12/17/21  Yes Patrecia Pour, MD  losartan (COZAAR) 50 MG tablet Take 1 tablet (50 mg total) by mouth daily. 11/15/21 02/13/22 Yes Elgergawy, Silver Huguenin, MD  ondansetron (ZOFRAN) 4 MG tablet Take 1 tablet (4 mg total) by mouth every 8 (eight) hours as needed for nausea or vomiting. 12/27/21  Yes Harris, Abigail, PA-C  pantoprazole (PROTONIX) 40 MG tablet Take 1 tablet (40 mg total) by mouth 2 (two) times daily. 11/15/21  Yes Elgergawy, Silver Huguenin, MD  promethazine (PHENERGAN) 25 MG tablet Take 1 tablet (25 mg total) by mouth every 6 (six) hours as needed for nausea or vomiting. 12/23/21  Yes Fredia Sorrow, MD  TRESIBA FLEXTOUCH 200 UNIT/ML FlexTouch Pen Inject 32 Units into the skin 2 (two) times daily. 11/22/21  Yes Nche, Charlene Brooke, NP  insulin aspart (NOVOLOG) 100 UNIT/ML injection Insulin (Novolog) sliding scale for additional Novolog: administer 56minutes before meals. Sugar <150 - no units Sugar >151-200 - 4 units Sugar >200-250 - 6 units Sugar >251-300 - 10 units and call office. Patient taking differently: Inject  4-10 Units into the skin See admin instructions. Insulin (Novolog) sliding scale for additional Novolog: administer 76minutes before meals. Sugar <150 - no units Sugar >151-200 - 4 units Sugar >200-250 - 6 units Sugar >251-300 - 10 units and call office. 11/22/21 12/17/21  Nche, Charlene Brooke, NP      Allergies    Patient has no known allergies.    Review of Systems   Review of Systems  Gastrointestinal:  Positive for diarrhea and vomiting.  All other systems reviewed and are negative.  Physical  Exam Updated Vital Signs BP (!) 200/110    Pulse (!) 110    Temp 99.7 F (37.6 C) (Axillary)    Resp (!) 21    Ht 5\' 6"  (1.676 m)    Wt 113.4 kg    SpO2 100%    BMI 40.35 kg/m  Physical Exam Vitals and nursing note reviewed.  Constitutional:      Comments: Chronically ill-appearing.  Dehydrated  HENT:     Head: Normocephalic.     Nose: Nose normal.     Mouth/Throat:     Mouth: Mucous membranes are dry.  Eyes:     Extraocular Movements: Extraocular movements intact.     Pupils: Pupils are equal, round, and reactive to light.  Neck:     Comments: Trach with minimal secretions. Cardiovascular:     Rate and Rhythm: Tachycardia present.  Pulmonary:     Comments: Diminished bilateral bases Abdominal:     General: Abdomen is flat.     Palpations: Abdomen is soft.     Comments: Mild diffuse tenderness on exam  Musculoskeletal:        General: Normal range of motion.  Skin:    General: Skin is warm.     Capillary Refill: Capillary refill takes less than 2 seconds.  Neurological:     General: No focal deficit present.     Mental Status: She is oriented to person, place, and time.  Psychiatric:        Mood and Affect: Mood normal.        Behavior: Behavior normal.    ED Results / Procedures / Treatments   Labs (all labs ordered are listed, but only abnormal results are displayed) Labs Reviewed  CBC WITH DIFFERENTIAL/PLATELET - Abnormal; Notable for the following components:      Result Value   WBC 14.5 (*)    Hemoglobin 11.5 (*)    HCT 35.0 (*)    Neutro Abs 12.5 (*)    Abs Immature Granulocytes 0.08 (*)    All other components within normal limits  COMPREHENSIVE METABOLIC PANEL - Abnormal; Notable for the following components:   Glucose, Bld 370 (*)    BUN 23 (*)    Creatinine, Ser 2.69 (*)    Total Protein 6.2 (*)    Albumin 2.7 (*)    GFR, Estimated 20 (*)    All other components within normal limits  URINALYSIS, ROUTINE W REFLEX MICROSCOPIC - Abnormal; Notable for  the following components:   APPearance HAZY (*)    Glucose, UA >=500 (*)    Hgb urine dipstick MODERATE (*)    Ketones, ur 15 (*)    Protein, ur >300 (*)    All other components within normal limits  URINALYSIS, MICROSCOPIC (REFLEX) - Abnormal; Notable for the following components:   Bacteria, UA RARE (*)    All other components within normal limits  TROPONIN I (HIGH SENSITIVITY) - Abnormal; Notable for the following components:  Troponin I (High Sensitivity) 43 (*)    All other components within normal limits  RESP PANEL BY RT-PCR (FLU A&B, COVID) [id number]  URINE CULTURE  LIPASE, BLOOD  I-STAT CHEM 8, ED    EKG EKG Interpretation  Date/Time:  Sunday February 13 2022 21:59:10 EST Ventricular Rate:  115 PR Interval:  140 QRS Duration: 136 QT Interval:  365 QTC Calculation: 505 R Axis:   15 Text Interpretation: Sinus tachycardia Ventricular premature complex Right bundle branch block ST elevation, consider inferior injury No significant change since last tracing Confirmed by Wandra Arthurs [phone]) on 02/13/2022 11:22:59 PM  Radiology CT ABDOMEN PELVIS WO CONTRAST  Result Date: 02/13/2022 CLINICAL DATA:  Acute abdominal pain.  Vomiting and diarrhea. EXAM: CT ABDOMEN AND PELVIS WITHOUT CONTRAST TECHNIQUE: Multidetector CT imaging of the abdomen and pelvis was performed following the standard protocol without IV contrast. RADIATION DOSE REDUCTION: This exam was performed according to the departmental dose-optimization program which includes automated exposure control, adjustment of the mA and/or kV according to patient size and/or use of iterative reconstruction technique. COMPARISON:  None. FINDINGS: Lower chest: No acute abnormality. Hepatobiliary: No focal liver abnormality is seen. No gallstones, gallbladder wall thickening, or biliary dilatation. Pancreas: Unremarkable. No pancreatic ductal dilatation or surrounding inflammatory changes. Spleen: Normal in size without focal abnormality.  Adrenals/Urinary Tract: There is no evidence for hydronephrosis or urinary tract calculus. Kidneys are normal in size. There is bilateral periureteral and perinephric stranding. Bladder is within normal limits. Adrenal glands are within normal limits. Stomach/Bowel: Stomach is within normal limits. Appendix appears normal. No evidence for bowel obstruction or free air. There is stranding and fluid surrounding the descending colon concerning for colitis. Vascular/Lymphatic: No significant vascular findings are present. No enlarged abdominal or pelvic lymph nodes. Reproductive: Status post hysterectomy. No adnexal masses. Other: There is mild stranding and fluid tracking along the bilateral retroperitoneal regions. There is no free air or focal abdominal wall hernia. There is mild body wall edema. Musculoskeletal: No acute or significant osseous findings. IMPRESSION: 1. Bilateral perinephric and Peri ureteral stranding and fluid may be related to inflammatory or infectious process. No urinary tract calculus or hydronephrosis. 2. Fluid and stranding surrounding the descending colon concerning for nonspecific colitis. 3. Mild body wall edema. Electronically Signed   By: Ronney Asters M.D.   On: 02/13/2022 21:53   DG Chest Port 1 View  Result Date: 02/13/2022 CLINICAL DATA:  cough EXAM: PORTABLE CHEST 1 VIEW COMPARISON:  January 14, 2022 FINDINGS: The cardiomediastinal silhouette is unchanged in contour.Tracheostomy. No pleural effusion. No pneumothorax. Low lung volumes. No acute pleuroparenchymal abnormality. Visualized abdomen is unremarkable. IMPRESSION: No acute cardiopulmonary abnormality. Electronically Signed   By: Valentino Saxon M.D.   On: 02/13/2022 21:00    Procedures Procedures    CRITICAL CARE Performed by: Wandra Arthurs   Total critical care time: 30 minutes  Critical care time was exclusive of separately billable procedures and treating other patients.  Critical care was necessary to  treat or prevent imminent or life-threatening deterioration.  Critical care was time spent personally by me on the following activities: development of treatment plan with patient and/or surrogate as well as nursing, discussions with consultants, evaluation of patient's response to treatment, examination of patient, obtaining history from patient or surrogate, ordering and performing treatments and interventions, ordering and review of laboratory studies, ordering and review of radiographic studies, pulse oximetry and re-evaluation of patient's condition.   Medications Ordered in ED Medications  metroNIDAZOLE (FLAGYL)  IVPB 500 mg (has no administration in time range)  promethazine (PHENERGAN) 12.5 mg in sodium chloride 0.9 % 50 mL IVPB (has no administration in time range)  labetalol (NORMODYNE) injection 10 mg (has no administration in time range)  sodium chloride 0.9 % bolus 1,000 mL (1,000 mLs Intravenous New Bag/Given 02/13/22 2124)  ondansetron (ZOFRAN) injection 4 mg (4 mg Intravenous Given 02/13/22 2120)  hydrALAZINE (APRESOLINE) injection 10 mg (10 mg Intravenous Given 02/13/22 2146)  cefTRIAXone (ROCEPHIN) 1 g in sodium chloride 0.9 % 100 mL IVPB (1 g Intravenous New Bag/Given 02/13/22 2252)  labetalol (NORMODYNE) injection 10 mg (10 mg Intravenous Given 02/13/22 2254)    ED Course/ Medical Decision Making/ A&P                           Medical Decision Making Macayla Delon is a 56 y.o. female here presenting with vomiting and diarrhea.  Patient has a trach.  Patient also has history of hypertension urgency and is out of her blood pressure medicine.  Consider hypertensive urgency versus emergency versus colitis.  We will get CBC and CMP and lipase and CT abdomen pelvis.  Patient has baseline creatinine of 2.2 and cannot get IV contrast  11:20 PM Patient's creatinine is 2.69 which is more elevated compared to 2.3.  Troponin is 43.  CT showed colitis and possible left pyelonephritis.   Patient received Rocephin and Flagyl.  Blood pressure still elevated over 200.  At this point will admit for hypertensive urgency, colitis.   Problems Addressed: AKI (acute kidney injury) ([place]): acute illness or injury Colitis: acute illness or injury Hypertensive urgency: acute illness or injury  Amount and/or Complexity of Data Reviewed Labs: ordered. Decision-making details documented in ED Course. Radiology: ordered and independent interpretation performed. Decision-making details documented in ED Course. ECG/medicine tests: ordered and independent interpretation performed. Decision-making details documented in ED Course.  Risk Prescription drug management. Decision regarding hospitalization.  Final Clinical Impression(s) / ED Diagnoses Final diagnoses:  Hypertensive urgency  AKI (acute kidney injury) ([place])  Colitis    Rx / DC Orders ED Discharge Orders     None         Drenda Freeze, MD 02/13/22 2323

## 2022-02-14 ENCOUNTER — Encounter: Payer: Medicare Other | Admitting: Nurse Practitioner

## 2022-02-14 ENCOUNTER — Encounter (HOSPITAL_COMMUNITY): Payer: Self-pay | Admitting: Internal Medicine

## 2022-02-14 ENCOUNTER — Ambulatory Visit: Payer: Medicare Other | Admitting: Nurse Practitioner

## 2022-02-14 ENCOUNTER — Inpatient Hospital Stay (HOSPITAL_COMMUNITY): Payer: Medicare Other

## 2022-02-14 DIAGNOSIS — R609 Edema, unspecified: Secondary | ICD-10-CM

## 2022-02-14 LAB — CBG MONITORING, ED
Glucose-Capillary: 354 mg/dL — ABNORMAL HIGH (ref 70–99)
Glucose-Capillary: 242 mg/dL — ABNORMAL HIGH (ref 70–99)
Glucose-Capillary: 223 mg/dL — ABNORMAL HIGH (ref 70–99)

## 2022-02-14 LAB — COMPREHENSIVE METABOLIC PANEL
ALT: 11 U/L (ref 0–44)
AST: 18 U/L (ref 15–41)
Albumin: 2.6 g/dL — ABNORMAL LOW (ref 3.5–5.0)
Alkaline Phosphatase: 108 U/L (ref 38–126)
Anion gap: 11 (ref 5–15)
BUN: 24 mg/dL — ABNORMAL HIGH (ref 6–20)
CO2: 24 mmol/L (ref 22–32)
Calcium: 9 mg/dL (ref 8.9–10.3)
Chloride: 101 mmol/L (ref 98–111)
Creatinine, Ser: 2.7 mg/dL — ABNORMAL HIGH (ref 0.44–1.00)
GFR, Estimated: 20 mL/min — ABNORMAL LOW (ref >60)
Glucose, Bld: 369 mg/dL — ABNORMAL HIGH (ref 70–99)
Potassium: 3.6 mmol/L (ref 3.5–5.1)
Sodium: 136 mmol/L (ref 135–145)
Total Bilirubin: 0.6 mg/dL (ref 0.3–1.2)
Total Protein: 6 g/dL — ABNORMAL LOW (ref 6.5–8.1)

## 2022-02-14 LAB — CBC
HCT: 33 % — ABNORMAL LOW (ref 36.0–46.0)
Hemoglobin: 10.3 g/dL — ABNORMAL LOW (ref 12.0–15.0)
MCH: 28.3 pg (ref 26.0–34.0)
MCHC: 31.2 g/dL (ref 30.0–36.0)
MCV: 90.7 fL (ref 80.0–100.0)
Platelets: 285 10*3/uL (ref 150–400)
RBC: 3.64 MIL/uL — ABNORMAL LOW (ref 3.87–5.11)
RDW: 14.3 % (ref 11.5–15.5)
WBC: 17.4 10*3/uL — ABNORMAL HIGH (ref 4.0–10.5)
nRBC: 0 % (ref 0.0–0.2)

## 2022-02-14 LAB — C DIFFICILE QUICK SCREEN W PCR REFLEX
C Diff antigen: NEGATIVE
C Diff interpretation: NOT DETECTED
C Diff toxin: NEGATIVE

## 2022-02-14 LAB — BRAIN NATRIURETIC PEPTIDE: B Natriuretic Peptide: 269 pg/mL — ABNORMAL HIGH (ref 0.0–100.0)

## 2022-02-14 LAB — TROPONIN I (HIGH SENSITIVITY)
Troponin I (High Sensitivity): 41 ng/L — ABNORMAL HIGH (ref <18)
Troponin I (High Sensitivity): 41 ng/L — ABNORMAL HIGH (ref <18)

## 2022-02-14 LAB — GLUCOSE, CAPILLARY: Glucose-Capillary: 211 mg/dL — ABNORMAL HIGH (ref 70–99)

## 2022-02-14 LAB — MRSA NEXT GEN BY PCR, NASAL: MRSA by PCR Next Gen: NOT DETECTED

## 2022-02-14 MED ORDER — FLUTICASONE PROPIONATE 50 MCG/ACT NA SUSP
2.0000 | Freq: Once | NASAL | Status: AC
  Administered 2022-02-14: 2 via NASAL
  Filled 2022-02-14: qty 16

## 2022-02-14 MED ORDER — NITROGLYCERIN 0.4 MG SL SUBL: 0.4000 mg | SUBLINGUAL_TABLET | SUBLINGUAL | Status: DC | PRN

## 2022-02-14 MED ORDER — SODIUM CHLORIDE 0.9 % IV SOLN: INTRAVENOUS | Status: DC

## 2022-02-14 MED ORDER — MORPHINE SULFATE (PF) 2 MG/ML IV SOLN
INTRAVENOUS | Status: AC
  Filled 2022-02-14: qty 1

## 2022-02-14 MED ORDER — HYDROCODONE-ACETAMINOPHEN 5-325 MG PO TABS
1.0000 | ORAL_TABLET | Freq: Four times a day (QID) | ORAL | Status: DC | PRN
  Administered 2022-02-14 – 2022-02-16 (×4): 1 via ORAL
  Filled 2022-02-14 (×4): qty 1

## 2022-02-14 MED ORDER — SODIUM CHLORIDE 0.9 % IV SOLN
1.0000 g | Freq: Once | INTRAVENOUS | Status: AC
  Administered 2022-02-14: 1 g via INTRAVENOUS
  Filled 2022-02-14: qty 10

## 2022-02-14 MED ORDER — SIMETHICONE 80 MG PO CHEW
80.0000 mg | CHEWABLE_TABLET | Freq: Once | ORAL | Status: AC
  Administered 2022-02-14: 80 mg via ORAL
  Filled 2022-02-14: qty 1

## 2022-02-14 MED ORDER — MORPHINE SULFATE (PF) 2 MG/ML IV SOLN
2.0000 mg | INTRAVENOUS | Status: DC | PRN
Start: 2022-02-14 — End: 2022-02-14
  Administered 2022-02-14: 2 mg via INTRAVENOUS

## 2022-02-14 NOTE — Progress Notes (Addendum)
PROGRESS NOTE    Maureen Jones  YQM:[id number] DOB: [birth year] DOA: 02/13/2022 PCP: Maureen Buffy, NP   Brief Narrative:  HPI: Maureen Jones is a 55 y.o. female with history of diabetes mellitus type 2, chronic kidney disease stage IV, hypertension, chronic respiratory failure with tracheostomy secondary to subglottic edema and vocal cord dysfunction recently was admitted and intubated for respiratory failure in the setting of subglottic edema and hypertensive crisis presents to the ER with nausea vomiting and abdominal discomfort with diarrhea for the last 3 days.  Denies any blood in the vomitus or diarrhea.  Patient states she has not taken her medication for the last 2 weeks since she did not get refill and had to go to her primary care physician before getting refills.   ED Course: In the ER patient is found to have markedly elevated blood pressure with systolic more than 528.  Tachycardic.  CT abdomen pelvis done shows perinephric stranding and also features concerning for colitis.  For blood pressure patient was given IV labetalol and restarted her home medication.  Also was started on empiric antibiotics for UTI and colitis.  High sensitive troponin 43.  EKG shows sinus tachycardia with RBBB.  Chest x-ray unremarkable.  COVID test negative.  Assessment & Plan:   Principal Problem:   Hypertensive urgency Active Problems:   Vocal cord dysfunction   OSA (obstructive sleep apnea)   DM (diabetes mellitus) (HCC)   CKD (chronic kidney disease) stage 4, GFR 15-29 ml/min (HCC)   Nausea vomiting and diarrhea  Hypertensive urgency: Due to patient running out of medications 2 weeks ago.  She has been resumed back at her home medications which include amlodipine, Coreg and hydralazine.  Blood pressure still elevated this morning however she is still due to get her medications.  Monitor closely.  Nausea/vomiting/diarrhea/acute colitis: CT abdomen shows acute colitis.  Last vomiting and  diarrhea was last night.  She has no complaints today except some abdominal pain and she is tender to palpation as well.  GI panel has been ordered but not the C. difficile.  I will check C. difficile as well.  We will continue Rocephin and Flagyl.  ?  Bilateral pyelonephritis: Patient denies urinary symptoms and her UA also is not impressive enough but CT scan shows bilateral pyelonephritis.  We will continue Rocephin for now and follow culture and tailor antibiotics accordingly.  AKI on CKD stage IIIb: Baseline creatinine seems to be around 2 with GFR of 31-33.  Currently 2.7.  Likely due to dehydration/nausea vomiting.  I will start her on normal saline at 100 cc/h and monitor labs in the morning.  Diabetes mellitus type 2: Hyperglycemic and hemoglobin A1c elevated as well, again due to patient unable to take any medication for 2 weeks.  She has been resumed back on her Lantus and SSI.  We will continue that and I will monitor closely.  History of subglottic edema and vocal cord dysfunction status post tracheostomy.  Stable.  Monitor.  Anemia of chronic disease: Stable.  Hyperlipidemia: Continue atorvastatin  DVT prophylaxis: enoxaparin (LOVENOX) injection 40 mg Start: 02/14/22 1000   Code Status: Partial Code  Family Communication:  None present at bedside.  Plan of care discussed with patient in length and he/she verbalized understanding and agreed with it.  Status is: Inpatient Remains inpatient appropriate because: Needs inpatient management.   Estimated body mass index is 40.35 kg/m as calculated from the following:   Height as of this encounter: 5\' 6"  (1.676  m).   Weight as of this encounter: 113.4 kg.    Nutritional Assessment: Body mass index is 40.35 kg/m.Marland Kitchen Seen by dietician.  I agree with the assessment and plan as outlined below: Nutrition Status:        . Skin Assessment: I have examined the patient's skin and I agree with the wound assessment as performed by the  wound care RN as outlined below:    Consultants:  None  Procedures:  None  Antimicrobials:  Anti-infectives (From admission, onward)    Start     Dose/Rate Route Frequency Ordered Stop   02/14/22 2200  cefTRIAXone (ROCEPHIN) 2 g in sodium chloride 0.9 % 100 mL IVPB        2 g 200 mL/hr over 30 Minutes Intravenous Every 24 hours 02/13/22 2336     02/14/22 0015  cefTRIAXone (ROCEPHIN) 1 g in sodium chloride 0.9 % 100 mL IVPB        1 g 200 mL/hr over 30 Minutes Intravenous  Once 02/14/22 0002 02/14/22 0248   02/13/22 2230  cefTRIAXone (ROCEPHIN) 1 g in sodium chloride 0.9 % 100 mL IVPB        1 g 200 mL/hr over 30 Minutes Intravenous  Once 02/13/22 2225 02/13/22 2322   02/13/22 2230  metroNIDAZOLE (FLAGYL) IVPB 500 mg        500 mg 100 mL/hr over 60 Minutes Intravenous Every 12 hours 02/13/22 2225           Subjective: Seen and examined.  She states that she feels better than yesterday.  Some intermittent nausea but no more vomiting or diarrhea since last night.  Some abdominal pain.  Objective: Vitals:   02/14/22 0740 02/14/22 0800 02/14/22 0814 02/14/22 0900  BP:  (!) 202/99  (!) 185/99  Pulse:  96  90  Resp:  14  15  Temp:   98.9 F (37.2 C)   TempSrc:   Oral   SpO2: 100% 100%  100%  Weight:      Height:        Intake/Output Summary (Last 24 hours) at 02/14/2022 1049 Last data filed at 02/14/2022 0038 Gross per 24 hour  Intake 1200 ml  Output --  Net 1200 ml   Filed Weights   02/13/22 2041  Weight: 113.4 kg    Examination:  General exam: Appears calm and comfortable, obese with a tracheostomy Respiratory system: Clear to auscultation. Respiratory effort normal. Cardiovascular system: S1 & S2 heard, RRR. No JVD, murmurs, rubs, gallops or clicks. No pedal edema. Gastrointestinal system: Abdomen is nondistended, soft and tender at periumbilical, right upper quadrant and right lower quadrant area. No organomegaly or masses felt. Normal bowel sounds  heard. Central nervous system: Alert and oriented. No focal neurological deficits. Extremities: Symmetric 5 x 5 power. Skin: No rashes, lesions or ulcers Psychiatry: Judgement and insight appear normal. Mood & affect appropriate.    Data Reviewed: I have personally reviewed following labs and imaging studies  CBC: Recent Labs  Lab 02/13/22 2100 02/14/22 0537  WBC 14.5* 17.4*  NEUTROABS 12.5*  --   HGB 11.5* 10.3*  HCT 35.0* 33.0*  MCV 89.3 90.7  PLT 307 657   Basic Metabolic Panel: Recent Labs  Lab 02/13/22 2100 02/14/22 0537  NA 136 136  K 3.8 3.6  CL 99 101  CO2 23 24  GLUCOSE 370* 369*  BUN 23* 24*  CREATININE 2.69* 2.70*  CALCIUM 9.7 9.0   GFR: Estimated Creatinine Clearance: 30.4 mL/min (A) (  by C-G formula based on SCr of 2.7 mg/dL (H)). Liver Function Tests: Recent Labs  Lab 02/13/22 2100 02/14/22 0537  AST 18 18  ALT 12 11  ALKPHOS 125 108  BILITOT 0.3 0.6  PROT 6.2* 6.0*  ALBUMIN 2.7* 2.6*   Recent Labs  Lab 02/13/22 2100  LIPASE 31   No results for input(s): AMMONIA in the last 168 hours. Coagulation Profile: No results for input(s): INR, PROTIME in the last 168 hours. Cardiac Enzymes: No results for input(s): CKTOTAL, CKMB, CKMBINDEX, TROPONINI in the last 168 hours. BNP (last 3 results) No results for input(s): PROBNP in the last 8760 hours. HbA1C: No results for input(s): HGBA1C in the last 72 hours. CBG: Recent Labs  Lab 02/14/22 0743  GLUCAP 354*   Lipid Profile: No results for input(s): CHOL, HDL, LDLCALC, TRIG, CHOLHDL, LDLDIRECT in the last 72 hours. Thyroid Function Tests: No results for input(s): TSH, T4TOTAL, FREET4, T3FREE, THYROIDAB in the last 72 hours. Anemia Panel: No results for input(s): VITAMINB12, FOLATE, FERRITIN, TIBC, IRON, RETICCTPCT in the last 72 hours. Sepsis Labs: No results for input(s): PROCALCITON, LATICACIDVEN in the last 168 hours.  Recent Results (from the past 240 hour(s))  Resp Panel by RT-PCR  (Flu A&B, Covid) Nasopharyngeal Swab     Status: None   Collection Time: 02/13/22  9:26 PM   Specimen: Nasopharyngeal Swab; Nasopharyngeal(NP) swabs in vial transport medium  Result Value Ref Range Status   SARS Coronavirus 2 by RT PCR NEGATIVE NEGATIVE Final    Comment: (NOTE) SARS-CoV-2 target nucleic acids are NOT DETECTED.  The SARS-CoV-2 RNA is generally detectable in upper respiratory specimens during the acute phase of infection. The lowest concentration of SARS-CoV-2 viral copies this assay can detect is 138 copies/mL. A negative result does not preclude SARS-Cov-2 infection and should not be used as the sole basis for treatment or other patient management decisions. A negative result may occur with  improper specimen collection/handling, submission of specimen other than nasopharyngeal swab, presence of viral mutation(s) within the areas targeted by this assay, and inadequate number of viral copies(<138 copies/mL). A negative result must be combined with clinical observations, patient history, and epidemiological information. The expected result is Negative.  Fact Sheet for Patients:  EntrepreneurPulse.com.au  Fact Sheet for Healthcare Providers:  IncredibleEmployment.be  This test is no t yet approved or cleared by the [place] FDA and  has been authorized for detection and/or diagnosis of SARS-CoV-2 by FDA under an Emergency Use Authorization (EUA). This EUA will remain  in effect (meaning this test can be used) for the duration of the COVID-19 declaration under Section 564(b)(1) of the Act, 21 U.S.C.section 360bbb-3(b)(1), unless the authorization is terminated  or revoked sooner.       Influenza A by PCR NEGATIVE NEGATIVE Final   Influenza B by PCR NEGATIVE NEGATIVE Final    Comment: (NOTE) The Xpert Xpress SARS-CoV-2/FLU/RSV plus assay is intended as an aid in the diagnosis of influenza from Nasopharyngeal swab specimens  and should not be used as a sole basis for treatment. Nasal washings and aspirates are unacceptable for Xpert Xpress SARS-CoV-2/FLU/RSV testing.  Fact Sheet for Patients: EntrepreneurPulse.com.au  Fact Sheet for Healthcare Providers: IncredibleEmployment.be  This test is not yet approved or cleared by the [place] FDA and has been authorized for detection and/or diagnosis of SARS-CoV-2 by FDA under an Emergency Use Authorization (EUA). This EUA will remain in effect (meaning this test can be used) for the duration of the COVID-19 declaration  under Section 564(b)(1) of the Act, 21 U.S.C. section 360bbb-3(b)(1), unless the authorization is terminated or revoked.  Performed at [facility], [place] [address], [place], [place] [address]      Radiology Studies: CT ABDOMEN PELVIS WO CONTRAST  Result Date: 02/13/2022 CLINICAL DATA:  Acute abdominal pain.  Vomiting and diarrhea. EXAM: CT ABDOMEN AND PELVIS WITHOUT CONTRAST TECHNIQUE: Multidetector CT imaging of the abdomen and pelvis was performed following the standard protocol without IV contrast. RADIATION DOSE REDUCTION: This exam was performed according to the departmental dose-optimization program which includes automated exposure control, adjustment of the mA and/or kV according to patient size and/or use of iterative reconstruction technique. COMPARISON:  None. FINDINGS: Lower chest: No acute abnormality. Hepatobiliary: No focal liver abnormality is seen. No gallstones, gallbladder wall thickening, or biliary dilatation. Pancreas: Unremarkable. No pancreatic ductal dilatation or surrounding inflammatory changes. Spleen: Normal in size without focal abnormality. Adrenals/Urinary Tract: There is no evidence for hydronephrosis or urinary tract calculus. Kidneys are normal in size. There is bilateral periureteral and perinephric stranding. Bladder is within normal limits. Adrenal glands are within  normal limits. Stomach/Bowel: Stomach is within normal limits. Appendix appears normal. No evidence for bowel obstruction or free air. There is stranding and fluid surrounding the descending colon concerning for colitis. Vascular/Lymphatic: No significant vascular findings are present. No enlarged abdominal or pelvic lymph nodes. Reproductive: Status post hysterectomy. No adnexal masses. Other: There is mild stranding and fluid tracking along the bilateral retroperitoneal regions. There is no free air or focal abdominal wall hernia. There is mild body wall edema. Musculoskeletal: No acute or significant osseous findings. IMPRESSION: 1. Bilateral perinephric and Peri ureteral stranding and fluid may be related to inflammatory or infectious process. No urinary tract calculus or hydronephrosis. 2. Fluid and stranding surrounding the descending colon concerning for nonspecific colitis. 3. Mild body wall edema. Electronically Signed   By: Ronney Asters M.D.   On: 02/13/2022 21:53   DG Chest Port 1 View  Result Date: 02/13/2022 CLINICAL DATA:  cough EXAM: PORTABLE CHEST 1 VIEW COMPARISON:  January 14, 2022 FINDINGS: The cardiomediastinal silhouette is unchanged in contour.Tracheostomy. No pleural effusion. No pneumothorax. Low lung volumes. No acute pleuroparenchymal abnormality. Visualized abdomen is unremarkable. IMPRESSION: No acute cardiopulmonary abnormality. Electronically Signed   By: Valentino Saxon M.D.   On: 02/13/2022 21:00   VAS [place] LOWER EXTREMITY VENOUS (DVT)  Result Date: 02/14/2022  Lower Venous DVT Study Patient Name:  Maureen Jones  Date of Exam:   02/14/2022 Medical Rec #: [id number]          Accession #:    [id number] Date of Birth: 27-Jun-1966           Patient Gender: F Patient Age:   37 years Exam Location:  [facility] Procedure:      VAS [place] LOWER EXTREMITY VENOUS (DVT) Referring Phys: Gean Birchwood --------------------------------------------------------------------------------   Indications: Edema.  Comparison Study: no prior Performing Technologist: Archie Patten RVS  Examination Guidelines: A complete evaluation includes B-mode imaging, spectral Doppler, color Doppler, and power Doppler as needed of all accessible portions of each vessel. Bilateral testing is considered an integral part of a complete examination. Limited examinations for reoccurring indications may be performed as noted. The reflux portion of the exam is performed with the patient in reverse Trendelenburg.  +---------+---------------+---------+-----------+----------+--------------+  RIGHT     Compressibility Phasicity Spontaneity Properties Thrombus Aging  +---------+---------------+---------+-----------+----------+--------------+  CFV       Full  Yes       Yes                                    +---------+---------------+---------+-----------+----------+--------------+  SFJ       Full                                                             +---------+---------------+---------+-----------+----------+--------------+  FV Prox   Full                                                             +---------+---------------+---------+-----------+----------+--------------+  FV Mid    Full                                                             +---------+---------------+---------+-----------+----------+--------------+  FV Distal Full                                                             +---------+---------------+---------+-----------+----------+--------------+  PFV       Full                                                             +---------+---------------+---------+-----------+----------+--------------+  POP       Full            Yes       Yes                                    +---------+---------------+---------+-----------+----------+--------------+  PTV       Full                                                              +---------+---------------+---------+-----------+----------+--------------+  PERO      Full                                                             +---------+---------------+---------+-----------+----------+--------------+   +---------+---------------+---------+-----------+----------+--------------+  LEFT      Compressibility Phasicity  Spontaneity Properties Thrombus Aging  +---------+---------------+---------+-----------+----------+--------------+  CFV       Full            Yes       Yes                                    +---------+---------------+---------+-----------+----------+--------------+  SFJ       Full                                                             +---------+---------------+---------+-----------+----------+--------------+  FV Prox   Full                                                             +---------+---------------+---------+-----------+----------+--------------+  FV Mid    Full                                                             +---------+---------------+---------+-----------+----------+--------------+  FV Distal Full                                                             +---------+---------------+---------+-----------+----------+--------------+  PFV       Full                                                             +---------+---------------+---------+-----------+----------+--------------+  POP       Full            Yes       Yes                                    +---------+---------------+---------+-----------+----------+--------------+  PTV       Full                                                             +---------+---------------+---------+-----------+----------+--------------+  PERO      Full                                                             +---------+---------------+---------+-----------+----------+--------------+  Summary: BILATERAL: - No evidence of deep vein thrombosis seen in the lower extremities, bilaterally. -No evidence of  popliteal cyst, bilaterally.   *See table(s) above for measurements and observations.    Preliminary     Scheduled Meds:  amLODipine  10 mg Oral Daily   atorvastatin  20 mg Oral Daily   budesonide  0.25 mg Nebulization BID   carvedilol  25 mg Oral BID WC   DULoxetine  60 mg Oral Daily   enoxaparin (LOVENOX) injection  40 mg Subcutaneous Q24H   hydrALAZINE  100 mg Oral TID   insulin aspart  0-9 Units Subcutaneous TID WC   insulin glargine-yfgn  25 Units Subcutaneous QHS   Continuous Infusions:  cefTRIAXone (ROCEPHIN)  IV     metronidazole Stopped (02/14/22 0030)   promethazine (PHENERGAN) injection (IM or IVPB)       LOS: 1 day   Time spent: 35 minutes  Darliss Cheney, MD [facility]  02/14/2022, 10:49 AM  Please page via [facility] and do not message via secure chat for urgent patient care matters. Secure chat can be used for non urgent patient care matters.  How to contact the [facility] Attending or Consulting provider [place] or covering provider during after hours [place], for this patient?  Check the care team in [facility] and look for a) attending/consulting [facility] provider listed and b) the [facility] team listed. Page or secure chat [phone]. Log into [email or web address] and use [place]'s universal password to access. If you do not have the password, please contact the hospital operator. Locate the [facility] provider you are looking for under [facility] and page to a number that you can be directly reached. If you still have difficulty reaching the provider, please page the [facility] (Director on Call) for the Hospitalists listed on amion for assistance.

## 2022-02-14 NOTE — ED Notes (Signed)
Pt reports onset of CP at 1711. This RN was made aware at 1722. Pt describes CP as bilateral and heavy w/ nausea rated 5/10. EKG obtained, Pahwani MD paged. ?

## 2022-02-14 NOTE — ED Notes (Signed)
Breakfast order placed ?

## 2022-02-14 NOTE — Telephone Encounter (Signed)
Pt did have an appt for today for medication refills but had to cancel because she is in the hospital, They will call back to reschedule when she is out ?

## 2022-02-15 LAB — MAGNESIUM: Magnesium: 1.4 mg/dL — ABNORMAL LOW (ref 1.7–2.4)

## 2022-02-15 LAB — GLUCOSE, CAPILLARY
Glucose-Capillary: 197 mg/dL — ABNORMAL HIGH (ref 70–99)
Glucose-Capillary: 164 mg/dL — ABNORMAL HIGH (ref 70–99)
Glucose-Capillary: 120 mg/dL — ABNORMAL HIGH (ref 70–99)
Glucose-Capillary: 169 mg/dL — ABNORMAL HIGH (ref 70–99)

## 2022-02-15 LAB — BASIC METABOLIC PANEL
Anion gap: 9 (ref 5–15)
BUN: 29 mg/dL — ABNORMAL HIGH (ref 6–20)
CO2: 22 mmol/L (ref 22–32)
Calcium: 8.6 mg/dL — ABNORMAL LOW (ref 8.9–10.3)
Chloride: 101 mmol/L (ref 98–111)
Creatinine, Ser: 2.78 mg/dL — ABNORMAL HIGH (ref 0.44–1.00)
GFR, Estimated: 20 mL/min — ABNORMAL LOW (ref >60)
Glucose, Bld: 214 mg/dL — ABNORMAL HIGH (ref 70–99)
Potassium: 3.5 mmol/L (ref 3.5–5.1)
Sodium: 132 mmol/L — ABNORMAL LOW (ref 135–145)

## 2022-02-15 LAB — GASTROINTESTINAL PANEL BY PCR, STOOL (REPLACES STOOL CULTURE)

## 2022-02-15 LAB — URINE CULTURE: Culture: 80000 — AB

## 2022-02-15 MED ORDER — ALUM & MAG HYDROXIDE-SIMETH 200-200-20 MG/5ML PO SUSP
30.0000 mL | ORAL | Status: DC | PRN
Start: 2022-02-15 — End: 2022-02-17
  Administered 2022-02-15 (×2): 30 mL via ORAL
  Filled 2022-02-15 (×2): qty 30

## 2022-02-15 MED ORDER — DIPHENHYDRAMINE HCL 50 MG/ML IJ SOLN
12.5000 mg | Freq: Once | INTRAMUSCULAR | Status: AC
  Administered 2022-02-15: 12.5 mg via INTRAVENOUS
  Filled 2022-02-15: qty 1

## 2022-02-15 MED ORDER — MAGNESIUM SULFATE 2 GM/50ML IV SOLN
2.0000 g | Freq: Once | INTRAVENOUS | Status: AC
  Administered 2022-02-15: 2 g via INTRAVENOUS
  Filled 2022-02-15: qty 50

## 2022-02-15 MED ORDER — INSULIN GLARGINE-YFGN 100 UNIT/ML ~~LOC~~ SOLN
35.0000 [IU] | Freq: Every day | SUBCUTANEOUS | Status: DC
  Administered 2022-02-15 – 2022-02-16 (×2): 35 [IU] via SUBCUTANEOUS
  Filled 2022-02-15 (×3): qty 0.35

## 2022-02-15 NOTE — Progress Notes (Signed)
PROGRESS NOTE    Maureen Jones  WVP:[id number] DOB: 1966/06/08 DOA: 02/13/2022 PCP: Flossie Buffy, NP   Brief Narrative:  HPI: Maureen Jones is a 56 y.o. female with history of diabetes mellitus type 2, chronic kidney disease stage IV, hypertension, chronic respiratory failure with tracheostomy secondary to subglottic edema and vocal cord dysfunction recently was admitted and intubated for respiratory failure in the setting of subglottic edema and hypertensive crisis presents to the ER with nausea vomiting and abdominal discomfort with diarrhea for the last 3 days.  Denies any blood in the vomitus or diarrhea.  Patient states she has not taken her medication for the last 2 weeks since she did not get refill and had to go to her primary care physician before getting refills.   ED Course: In the ER patient is found to have markedly elevated blood pressure with systolic more than 546.  Tachycardic.  CT abdomen pelvis done shows perinephric stranding and also features concerning for colitis.  For blood pressure patient was given IV labetalol and restarted her home medication.  Also was started on empiric antibiotics for UTI and colitis.  High sensitive troponin 43.  EKG shows sinus tachycardia with RBBB.  Chest x-ray unremarkable.  COVID test negative.  Assessment & Plan:   Principal Problem:   Hypertensive urgency Active Problems:   Vocal cord dysfunction   OSA (obstructive sleep apnea)   DM (diabetes mellitus) (HCC)   CKD (chronic kidney disease) stage 4, GFR 15-29 ml/min (HCC)   Nausea vomiting and diarrhea  Hypertensive urgency: Due to patient running out of medications 2 weeks ago.  She has been resumed back at her home medications which include amlodipine, Coreg and hydralazine.  Blood pressure now much better.  Continue current management.  Nausea/vomiting/diarrhea/acute colitis: CT abdomen shows acute colitis.  Some nausea but no vomiting or diarrhea since 2 days.  C.  difficile negative.  GI pathogen panel pending.  Continue current antibiotics.  ?  Bilateral pyelonephritis: Patient denies urinary symptoms and her UA also is not impressive enough but CT scan shows bilateral pyelonephritis.  Urine culture growing Aerococcus species.  Continue Rocephin.  AKI on CKD stage IIIb: Baseline creatinine seems to be around 2 with GFR of 31-33.  Currently 2.7.  No improvement compared to yesterday.  Continue her on normal saline at 100 cc/h and monitor labs in the morning.  Diabetes mellitus type 2: Hyperglycemic and hemoglobin A1c elevated as well, again due to patient unable to take any medication for 2 weeks.  However blood sugar is better than yesterday.  She has been resumed back on her Lantus and SSI.  We will continue that and I will monitor closely.  History of subglottic edema and vocal cord dysfunction status post tracheostomy.  Stable.  Monitor.  Anemia of chronic disease: Stable.  Hyperlipidemia: Continue atorvastatin  DVT prophylaxis: enoxaparin (LOVENOX) injection 40 mg Start: 02/14/22 1000   Code Status: Partial Code  Family Communication:  None present at bedside.  Plan of care discussed with patient in length and he/she verbalized understanding and agreed with it.  Status is: Inpatient Remains inpatient appropriate because: Needs inpatient management.   Estimated body mass index is 40.35 kg/m as calculated from the following:   Height as of this encounter: 5\' 6"  (1.676 m).   Weight as of this encounter: 113.4 kg.    Nutritional Assessment: Body mass index is 40.35 kg/m.Marland Kitchen Seen by dietician.  I agree with the assessment and plan as outlined below: Nutrition  Status:        . Skin Assessment: I have examined the patient's skin and I agree with the wound assessment as performed by the wound care RN as outlined below:    Consultants:  None  Procedures:  None  Antimicrobials:  Anti-infectives (From admission, onward)    Start      Dose/Rate Route Frequency Ordered Stop   02/14/22 2200  cefTRIAXone (ROCEPHIN) 2 g in sodium chloride 0.9 % 100 mL IVPB        2 g 200 mL/hr over 30 Minutes Intravenous Every 24 hours 02/13/22 2336     02/14/22 0015  cefTRIAXone (ROCEPHIN) 1 g in sodium chloride 0.9 % 100 mL IVPB        1 g 200 mL/hr over 30 Minutes Intravenous  Once 02/14/22 0002 02/14/22 0248   02/13/22 2230  cefTRIAXone (ROCEPHIN) 1 g in sodium chloride 0.9 % 100 mL IVPB        1 g 200 mL/hr over 30 Minutes Intravenous  Once 02/13/22 2225 02/13/22 2322   02/13/22 2230  metroNIDAZOLE (FLAGYL) IVPB 500 mg        500 mg 100 mL/hr over 60 Minutes Intravenous Every 12 hours 02/13/22 2225           Subjective: Seen and examined.  Complains of some nausea but no vomiting.  Abdominal pain is improved.  No diarrhea.  Per nursing, she had 1 semisolid stool.  Objective: Vitals:   02/15/22 0614 02/15/22 0759 02/15/22 1118 02/15/22 1141  BP: (!) 197/93   137/71  Pulse: 83 83 78 67  Resp: 15 16 16 14   Temp:    98 F (36.7 C)  TempSrc:    Oral  SpO2:  99% 94% 94%  Weight:      Height:        Intake/Output Summary (Last 24 hours) at 02/15/2022 1301 Last data filed at 02/15/2022 0339 Gross per 24 hour  Intake 1321.72 ml  Output --  Net 1321.72 ml    Filed Weights   02/13/22 2041  Weight: 113.4 kg    Examination:  General exam: Appears calm and comfortable  Respiratory system: Clear to auscultation. Respiratory effort normal. Cardiovascular system: S1 & S2 heard, RRR. No JVD, murmurs, rubs, gallops or clicks. No pedal edema. Gastrointestinal system: Abdomen is nondistended, soft and very minimal periumbilical tenderness, much improved than yesterday. No organomegaly or masses felt. Normal bowel sounds heard. Central nervous system: Alert and oriented. No focal neurological deficits. Extremities: Symmetric 5 x 5 power. Skin: No rashes, lesions or ulcers.  Psychiatry: Judgement and insight appear normal. Mood &  affect appropriate.   Data Reviewed: I have personally reviewed following labs and imaging studies  CBC: Recent Labs  Lab 02/13/22 2100 02/14/22 0537  WBC 14.5* 17.4*  NEUTROABS 12.5*  --   HGB 11.5* 10.3*  HCT 35.0* 33.0*  MCV 89.3 90.7  PLT 307 892    Basic Metabolic Panel: Recent Labs  Lab 02/13/22 2100 02/14/22 0537 02/15/22 0055  NA 136 136 132*  K 3.8 3.6 3.5  CL 99 101 101  CO2 23 24 22   GLUCOSE 370* 369* 214*  BUN 23* 24* 29*  CREATININE 2.69* 2.70* 2.78*  CALCIUM 9.7 9.0 8.6*  MG  --   --  1.4*    GFR: Estimated Creatinine Clearance: 29.5 mL/min (A) (by C-G formula based on SCr of 2.78 mg/dL (H)). Liver Function Tests: Recent Labs  Lab 02/13/22 2100 02/14/22 0537  AST  18 18  ALT 12 11  ALKPHOS 125 108  BILITOT 0.3 0.6  PROT 6.2* 6.0*  ALBUMIN 2.7* 2.6*    Recent Labs  Lab 02/13/22 2100  LIPASE 31    No results for input(s): AMMONIA in the last 168 hours. Coagulation Profile: No results for input(s): INR, PROTIME in the last 168 hours. Cardiac Enzymes: No results for input(s): CKTOTAL, CKMB, CKMBINDEX, TROPONINI in the last 168 hours. BNP (last 3 results) No results for input(s): PROBNP in the last 8760 hours. HbA1C: No results for input(s): HGBA1C in the last 72 hours. CBG: Recent Labs  Lab 02/14/22 1306 02/14/22 1649 02/14/22 2115 02/15/22 0559 02/15/22 1158  GLUCAP 242* 223* 211* 197* 164*    Lipid Profile: No results for input(s): CHOL, HDL, LDLCALC, TRIG, CHOLHDL, LDLDIRECT in the last 72 hours. Thyroid Function Tests: No results for input(s): TSH, T4TOTAL, FREET4, T3FREE, THYROIDAB in the last 72 hours. Anemia Panel: No results for input(s): VITAMINB12, FOLATE, FERRITIN, TIBC, IRON, RETICCTPCT in the last 72 hours. Sepsis Labs: No results for input(s): PROCALCITON, LATICACIDVEN in the last 168 hours.  Recent Results (from the past 240 hour(s))  Resp Panel by RT-PCR (Flu A&B, Covid) Nasopharyngeal Swab     Status: None    Collection Time: 02/13/22  9:26 PM   Specimen: Nasopharyngeal Swab; Nasopharyngeal(NP) swabs in vial transport medium  Result Value Ref Range Status   SARS Coronavirus 2 by RT PCR NEGATIVE NEGATIVE Final    Comment: (NOTE) SARS-CoV-2 target nucleic acids are NOT DETECTED.  The SARS-CoV-2 RNA is generally detectable in upper respiratory specimens during the acute phase of infection. The lowest concentration of SARS-CoV-2 viral copies this assay can detect is 138 copies/mL. A negative result does not preclude SARS-Cov-2 infection and should not be used as the sole basis for treatment or other patient management decisions. A negative result may occur with  improper specimen collection/handling, submission of specimen other than nasopharyngeal swab, presence of viral mutation(s) within the areas targeted by this assay, and inadequate number of viral copies(<138 copies/mL). A negative result must be combined with clinical observations, patient history, and epidemiological information. The expected result is Negative.  Fact Sheet for Patients:  EntrepreneurPulse.com.au  Fact Sheet for Healthcare Providers:  IncredibleEmployment.be  This test is no t yet approved or cleared by the [place] FDA and  has been authorized for detection and/or diagnosis of SARS-CoV-2 by FDA under an Emergency Use Authorization (EUA). This EUA will remain  in effect (meaning this test can be used) for the duration of the COVID-19 declaration under Section 564(b)(1) of the Act, 21 U.S.C.section 360bbb-3(b)(1), unless the authorization is terminated  or revoked sooner.       Influenza A by PCR NEGATIVE NEGATIVE Final   Influenza B by PCR NEGATIVE NEGATIVE Final    Comment: (NOTE) The Xpert Xpress SARS-CoV-2/FLU/RSV plus assay is intended as an aid in the diagnosis of influenza from Nasopharyngeal swab specimens and should not be used as a sole basis for treatment.  Nasal washings and aspirates are unacceptable for Xpert Xpress SARS-CoV-2/FLU/RSV testing.  Fact Sheet for Patients: EntrepreneurPulse.com.au  Fact Sheet for Healthcare Providers: IncredibleEmployment.be  This test is not yet approved or cleared by the [place] FDA and has been authorized for detection and/or diagnosis of SARS-CoV-2 by FDA under an Emergency Use Authorization (EUA). This EUA will remain in effect (meaning this test can be used) for the duration of the COVID-19 declaration under Section 564(b)(1) of the Act, 21 U.S.C.  section 360bbb-3(b)(1), unless the authorization is terminated or revoked.  Performed at [facility], [place] [address] [place], [place] [address]   Urine Culture     Status: Abnormal   Collection Time: 02/13/22 10:56 PM   Specimen: Urine, Clean Catch  Result Value Ref Range Status   Specimen Description URINE, CLEAN CATCH  Final   Special Requests NONE  Final   Culture (A)  Final    80,000 COLONIES/mL AEROCOCCUS SPECIES Standardized susceptibility testing for this organism is not available. Performed at [facility], [place] [address] [place], [place] [address]    Report Status 02/15/2022 FINAL  Final  C Difficile Quick Screen w PCR reflex     Status: None   Collection Time: 02/14/22 11:53 AM   Specimen: Stool  Result Value Ref Range Status   C Diff antigen NEGATIVE NEGATIVE Final   C Diff toxin NEGATIVE NEGATIVE Final   C Diff interpretation No C. difficile detected.  Final    Comment: Performed at [facility], [place] [address] [place], [place] [address]  MRSA Next Gen by PCR, Nasal     Status: None   Collection Time: 02/14/22  9:45 PM   Specimen: Nasal Mucosa; Nasal Swab  Result Value Ref Range Status   MRSA by PCR Next Gen NOT DETECTED NOT DETECTED Final    Comment: (NOTE) The GeneXpert MRSA Assay (FDA approved for NASAL specimens only), is one component of a comprehensive MRSA  colonization surveillance program. It is not intended to diagnose MRSA infection nor to guide or monitor treatment for MRSA infections. Test performance is not FDA approved in patients less than 60 years old. Performed at [facility], [place] [address], [place],  [address]       Radiology Studies: CT ABDOMEN PELVIS WO CONTRAST  Result Date: 02/13/2022 CLINICAL DATA:  Acute abdominal pain.  Vomiting and diarrhea. EXAM: CT ABDOMEN AND PELVIS WITHOUT CONTRAST TECHNIQUE: Multidetector CT imaging of the abdomen and pelvis was performed following the standard protocol without IV contrast. RADIATION DOSE REDUCTION: This exam was performed according to the departmental dose-optimization program which includes automated exposure control, adjustment of the mA and/or kV according to patient size and/or use of iterative reconstruction technique. COMPARISON:  None. FINDINGS: Lower chest: No acute abnormality. Hepatobiliary: No focal liver abnormality is seen. No gallstones, gallbladder wall thickening, or biliary dilatation. Pancreas: Unremarkable. No pancreatic ductal dilatation or surrounding inflammatory changes. Spleen: Normal in size without focal abnormality. Adrenals/Urinary Tract: There is no evidence for hydronephrosis or urinary tract calculus. Kidneys are normal in size. There is bilateral periureteral and perinephric stranding. Bladder is within normal limits. Adrenal glands are within normal limits. Stomach/Bowel: Stomach is within normal limits. Appendix appears normal. No evidence for bowel obstruction or free air. There is stranding and fluid surrounding the descending colon concerning for colitis. Vascular/Lymphatic: No significant vascular findings are present. No enlarged abdominal or pelvic lymph nodes. Reproductive: Status post hysterectomy. No adnexal masses. Other: There is mild stranding and fluid tracking along the bilateral retroperitoneal regions. There is no free air or focal  abdominal wall hernia. There is mild body wall edema. Musculoskeletal: No acute or significant osseous findings. IMPRESSION: 1. Bilateral perinephric and Peri ureteral stranding and fluid may be related to inflammatory or infectious process. No urinary tract calculus or hydronephrosis. 2. Fluid and stranding surrounding the descending colon concerning for nonspecific colitis. 3. Mild body wall edema. Electronically Signed   By: Ronney Asters  M.D.   On: 02/13/2022 21:53   DG Chest Port 1 View  Result Date: 02/13/2022 CLINICAL DATA:  cough EXAM: PORTABLE CHEST 1 VIEW COMPARISON:  January 14, 2022 FINDINGS: The cardiomediastinal silhouette is unchanged in contour.Tracheostomy. No pleural effusion. No pneumothorax. Low lung volumes. No acute pleuroparenchymal abnormality. Visualized abdomen is unremarkable. IMPRESSION: No acute cardiopulmonary abnormality. Electronically Signed   By: Valentino Saxon M.D.   On: 02/13/2022 21:00   VAS [place] LOWER EXTREMITY VENOUS (DVT)  Result Date: 02/14/2022  Lower Venous DVT Study Patient Name:  ARTESHA WEMHOFF  Date of Exam:   02/14/2022 Medical Rec #: [id number]          Accession #:    [id number] Date of Birth: 06/16/1966           Patient Gender: F Patient Age:   66 years Exam Location:  [facility] Procedure:      VAS [place] LOWER EXTREMITY VENOUS (DVT) Referring Phys: Gean Birchwood --------------------------------------------------------------------------------  Indications: Edema.  Comparison Study: no prior Performing Technologist: Archie Patten RVS  Examination Guidelines: A complete evaluation includes B-mode imaging, spectral Doppler, color Doppler, and power Doppler as needed of all accessible portions of each vessel. Bilateral testing is considered an integral part of a complete examination. Limited examinations for reoccurring indications may be performed as noted. The reflux portion of the exam is performed with the patient in reverse Trendelenburg.   +---------+---------------+---------+-----------+----------+--------------+  RIGHT     Compressibility Phasicity Spontaneity Properties Thrombus Aging  +---------+---------------+---------+-----------+----------+--------------+  CFV       Full            Yes       Yes                                    +---------+---------------+---------+-----------+----------+--------------+  SFJ       Full                                                             +---------+---------------+---------+-----------+----------+--------------+  FV Prox   Full                                                             +---------+---------------+---------+-----------+----------+--------------+  FV Mid    Full                                                             +---------+---------------+---------+-----------+----------+--------------+  FV Distal Full                                                             +---------+---------------+---------+-----------+----------+--------------+  PFV  Full                                                             +---------+---------------+---------+-----------+----------+--------------+  POP       Full            Yes       Yes                                    +---------+---------------+---------+-----------+----------+--------------+  PTV       Full                                                             +---------+---------------+---------+-----------+----------+--------------+  PERO      Full                                                             +---------+---------------+---------+-----------+----------+--------------+   +---------+---------------+---------+-----------+----------+--------------+  LEFT      Compressibility Phasicity Spontaneity Properties Thrombus Aging  +---------+---------------+---------+-----------+----------+--------------+  CFV       Full            Yes       Yes                                     +---------+---------------+---------+-----------+----------+--------------+  SFJ       Full                                                             +---------+---------------+---------+-----------+----------+--------------+  FV Prox   Full                                                             +---------+---------------+---------+-----------+----------+--------------+  FV Mid    Full                                                             +---------+---------------+---------+-----------+----------+--------------+  FV Distal Full                                                             +---------+---------------+---------+-----------+----------+--------------+  PFV       Full                                                             +---------+---------------+---------+-----------+----------+--------------+  POP       Full            Yes       Yes                                    +---------+---------------+---------+-----------+----------+--------------+  PTV       Full                                                             +---------+---------------+---------+-----------+----------+--------------+  PERO      Full                                                             +---------+---------------+---------+-----------+----------+--------------+     Summary: BILATERAL: - No evidence of deep vein thrombosis seen in the lower extremities, bilaterally. -No evidence of popliteal cyst, bilaterally.   *See table(s) above for measurements and observations. Electronically signed by Servando Snare MD on 02/14/2022 at 5:12:16 PM.    Final     Scheduled Meds:  amLODipine  10 mg Oral Daily   atorvastatin  20 mg Oral Daily   budesonide  0.25 mg Nebulization BID   carvedilol  25 mg Oral BID WC   DULoxetine  60 mg Oral Daily   enoxaparin (LOVENOX) injection  40 mg Subcutaneous Q24H   hydrALAZINE  100 mg Oral TID   insulin aspart  0-9 Units Subcutaneous TID WC   insulin glargine-yfgn  35 Units  Subcutaneous QHS   Continuous Infusions:  sodium chloride 100 mL/hr at 02/14/22 2306   cefTRIAXone (ROCEPHIN)  IV 2 g (02/14/22 2151)   metronidazole 500 mg (02/15/22 1218)   promethazine (PHENERGAN) injection (IM or IVPB) 12.5 mg (02/15/22 1252)     LOS: 2 days   Time spent: 30 minutes  Darliss Cheney, MD [facility]  02/15/2022, 1:01 PM  Please page via [facility] and do not message via secure chat for urgent patient care matters. Secure chat can be used for non urgent patient care matters.  How to contact the [facility] Attending or Consulting provider [place] or covering provider during after hours [place], for this patient?  Check the care team in [facility] and look for a) attending/consulting [facility] provider listed and b) the [facility] team listed. Page or secure chat [phone]. Log into [email or web address] and use [place]'s universal password to access. If you do not have the password, please contact the hospital operator. Locate the [facility] provider you are looking for under [facility] and page to a number that you can be directly reached. If you still have difficulty reaching the provider,  please page the [facility] (Director on Call) for the Hospitalists listed on amion for assistance.

## 2022-02-15 NOTE — Progress Notes (Signed)
Changed H2O bottle on trach collar. ?

## 2022-02-16 LAB — BASIC METABOLIC PANEL
Anion gap: 12 (ref 5–15)
BUN: 32 mg/dL — ABNORMAL HIGH (ref 6–20)
CO2: 19 mmol/L — ABNORMAL LOW (ref 22–32)
Calcium: 8.2 mg/dL — ABNORMAL LOW (ref 8.9–10.3)
Chloride: 103 mmol/L (ref 98–111)
Creatinine, Ser: 2.55 mg/dL — ABNORMAL HIGH (ref 0.44–1.00)
GFR, Estimated: 22 mL/min — ABNORMAL LOW (ref >60)
Glucose, Bld: 209 mg/dL — ABNORMAL HIGH (ref 70–99)
Potassium: 3.8 mmol/L (ref 3.5–5.1)
Sodium: 134 mmol/L — ABNORMAL LOW (ref 135–145)

## 2022-02-16 LAB — GLUCOSE, CAPILLARY
Glucose-Capillary: 95 mg/dL (ref 70–99)
Glucose-Capillary: 100 mg/dL — ABNORMAL HIGH (ref 70–99)
Glucose-Capillary: 110 mg/dL — ABNORMAL HIGH (ref 70–99)
Glucose-Capillary: 159 mg/dL — ABNORMAL HIGH (ref 70–99)

## 2022-02-16 MED ORDER — LOSARTAN POTASSIUM 50 MG PO TABS
50.0000 mg | ORAL_TABLET | Freq: Every day | ORAL | Status: DC
  Administered 2022-02-16 – 2022-02-17 (×2): 50 mg via ORAL
  Filled 2022-02-16 (×2): qty 1

## 2022-02-16 NOTE — Progress Notes (Signed)
PROGRESS NOTE    Maureen Jones  WUJ:[id number] DOB: [birth year] DOA: 02/13/2022 PCP: Flossie Buffy, NP   Brief Narrative:  Maureen Jones is a 56 y.o. female with history of diabetes mellitus type 2, chronic kidney disease stage IV, hypertension, chronic respiratory failure with tracheostomy secondary to subglottic edema and vocal cord dysfunction recently was admitted and intubated for respiratory failure in the setting of subglottic edema and hypertensive crisis presented to the ER with nausea vomiting and abdominal discomfort with diarrhea for the last 3 days.  She apparently ran out of her medications 2 weeks ago.   In the ER patient is found to have markedly elevated blood pressure with systolic more than 956.  Tachycardic.  CT abdomen pelvis done shows perinephric stranding and also features concerning for colitis.  For blood pressure patient was given IV labetalol and restarted her home medication.  Also was started on empiric antibiotics for UTI and colitis.   Assessment & Plan:   Principal Problem:   Hypertensive urgency Active Problems:   Vocal cord dysfunction   OSA (obstructive sleep apnea)   DM (diabetes mellitus) (HCC)   CKD (chronic kidney disease) stage 4, GFR 15-29 ml/min (HCC)   Nausea vomiting and diarrhea  Hypertensive urgency: Due to patient running out of medications 2 weeks ago.  She has been resumed back at her home medications which include amlodipine, Coreg, losartan and hydralazine.  Blood pressure is still elevated.  She is on max dose of all the medication so I will now resume losartan.  Nausea/vomiting/diarrhea/acute colitis: CT abdomen shows acute colitis.  C. difficile and GI pathogen panel negative.  Still with nausea but no vomiting or diarrhea since 2 days.  Continue Rocephin and Flagyl.  No abdominal tenderness today.  ?  Bilateral pyelonephritis: Patient denies urinary symptoms and her UA also is not impressive enough but CT scan shows  bilateral pyelonephritis.  Urine culture growing Aerococcus species.  She is on Rocephin.  AKI on CKD stage IIIb: Baseline creatinine seems to be around 2 with GFR of 31-33.  Some improvement, currently 2.55.  Continue her on normal saline at 100 cc/h and monitor labs in the morning.  Diabetes mellitus type 2: Hyperglycemic and hemoglobin A1c elevated as well, again due to patient unable to take any medication for 2 weeks.  Blood sugar now controlled.  She has been resumed back on her Lantus and SSI.  We will continue that and I will monitor closely.  History of subglottic edema and vocal cord dysfunction status post tracheostomy.  Stable.  Monitor.  Anemia of chronic disease: Stable.  Hyperlipidemia: Continue atorvastatin  DVT prophylaxis: enoxaparin (LOVENOX) injection 40 mg Start: 02/14/22 1000   Code Status: Partial Code  Family Communication:  None present at bedside.  Plan of care discussed with patient in length and he/she verbalized understanding and agreed with it.  Status is: Inpatient Remains inpatient appropriate because: Needs inpatient management.   Estimated body mass index is 40.35 kg/m as calculated from the following:   Height as of this encounter: 5\' 6"  (1.676 m).   Weight as of this encounter: 113.4 kg.    Nutritional Assessment: Body mass index is 40.35 kg/m.Marland Kitchen Seen by dietician.  I agree with the assessment and plan as outlined below: Nutrition Status:        . Skin Assessment: I have examined the patient's skin and I agree with the wound assessment as performed by the wound care RN as outlined below:    Consultants:  None  Procedures:  None  Antimicrobials:  Anti-infectives (From admission, onward)    Start     Dose/Rate Route Frequency Ordered Stop   02/14/22 2200  cefTRIAXone (ROCEPHIN) 2 g in sodium chloride 0.9 % 100 mL IVPB        2 g 200 mL/hr over 30 Minutes Intravenous Every 24 hours 02/13/22 2336     02/14/22 0015  cefTRIAXone  (ROCEPHIN) 1 g in sodium chloride 0.9 % 100 mL IVPB        1 g 200 mL/hr over 30 Minutes Intravenous  Once 02/14/22 0002 02/14/22 0248   02/13/22 2230  cefTRIAXone (ROCEPHIN) 1 g in sodium chloride 0.9 % 100 mL IVPB        1 g 200 mL/hr over 30 Minutes Intravenous  Once 02/13/22 2225 02/13/22 2322   02/13/22 2230  metroNIDAZOLE (FLAGYL) IVPB 500 mg        500 mg 100 mL/hr over 60 Minutes Intravenous Every 12 hours 02/13/22 2225           Subjective: Patient seen and examined.  She complains of nausea but no other complaint.  Objective: Vitals:   02/16/22 0331 02/16/22 0445 02/16/22 0743 02/16/22 0758  BP: (!) 177/83   (!) 196/86  Pulse: 88     Resp: 15  16 10   Temp:    98.5 F (36.9 C)  TempSrc:      SpO2: 96% 99%    Weight:      Height:        Intake/Output Summary (Last 24 hours) at 02/16/2022 1027 Last data filed at 02/16/2022 0400 Gross per 24 hour  Intake 1907.94 ml  Output 1250 ml  Net 657.94 ml    Filed Weights   02/13/22 2041  Weight: 113.4 kg    Examination:  General exam: Appears calm and comfortable  Respiratory system: Clear to auscultation. Respiratory effort normal.  Has tracheostomy. Cardiovascular system: S1 & S2 heard, RRR. No JVD, murmurs, rubs, gallops or clicks. No pedal edema. Gastrointestinal system: Abdomen is nondistended, soft and nontender. No organomegaly or masses felt. Normal bowel sounds heard. Central nervous system: Alert and oriented. No focal neurological deficits. Extremities: Symmetric 5 x 5 power. Skin: No rashes, lesions or ulcers.  Psychiatry: Judgement and insight appear normal. Mood & affect appropriate.   Data Reviewed: I have personally reviewed following labs and imaging studies  CBC: Recent Labs  Lab 02/13/22 2100 02/14/22 0537  WBC 14.5* 17.4*  NEUTROABS 12.5*  --   HGB 11.5* 10.3*  HCT 35.0* 33.0*  MCV 89.3 90.7  PLT 307 536    Basic Metabolic Panel: Recent Labs  Lab 02/13/22 2100 02/14/22 0537  02/15/22 0055 02/16/22 0050  NA 136 136 132* 134*  K 3.8 3.6 3.5 3.8  CL 99 101 101 103  CO2 23 24 22  19*  GLUCOSE 370* 369* 214* 209*  BUN 23* 24* 29* 32*  CREATININE 2.69* 2.70* 2.78* 2.55*  CALCIUM 9.7 9.0 8.6* 8.2*  MG  --   --  1.4*  --     GFR: Estimated Creatinine Clearance: 32.2 mL/min (A) (by C-G formula based on SCr of 2.55 mg/dL (H)). Liver Function Tests: Recent Labs  Lab 02/13/22 2100 02/14/22 0537  AST 18 18  ALT 12 11  ALKPHOS 125 108  BILITOT 0.3 0.6  PROT 6.2* 6.0*  ALBUMIN 2.7* 2.6*    Recent Labs  Lab 02/13/22 2100  LIPASE 31    No results for input(s): AMMONIA  in the last 168 hours. Coagulation Profile: No results for input(s): INR, PROTIME in the last 168 hours. Cardiac Enzymes: No results for input(s): CKTOTAL, CKMB, CKMBINDEX, TROPONINI in the last 168 hours. BNP (last 3 results) No results for input(s): PROBNP in the last 8760 hours. HbA1C: No results for input(s): HGBA1C in the last 72 hours. CBG: Recent Labs  Lab 02/15/22 0559 02/15/22 1158 02/15/22 1627 02/15/22 2126 02/16/22 0610  GLUCAP 197* 164* 120* 169* 95    Lipid Profile: No results for input(s): CHOL, HDL, LDLCALC, TRIG, CHOLHDL, LDLDIRECT in the last 72 hours. Thyroid Function Tests: No results for input(s): TSH, T4TOTAL, FREET4, T3FREE, THYROIDAB in the last 72 hours. Anemia Panel: No results for input(s): VITAMINB12, FOLATE, FERRITIN, TIBC, IRON, RETICCTPCT in the last 72 hours. Sepsis Labs: No results for input(s): PROCALCITON, LATICACIDVEN in the last 168 hours.  Recent Results (from the past 240 hour(s))  Resp Panel by RT-PCR (Flu A&B, Covid) Nasopharyngeal Swab     Status: None   Collection Time: 02/13/22  9:26 PM   Specimen: Nasopharyngeal Swab; Nasopharyngeal(NP) swabs in vial transport medium  Result Value Ref Range Status   SARS Coronavirus 2 by RT PCR NEGATIVE NEGATIVE Final    Comment: (NOTE) SARS-CoV-2 target nucleic acids are NOT DETECTED.  The  SARS-CoV-2 RNA is generally detectable in upper respiratory specimens during the acute phase of infection. The lowest concentration of SARS-CoV-2 viral copies this assay can detect is 138 copies/mL. A negative result does not preclude SARS-Cov-2 infection and should not be used as the sole basis for treatment or other patient management decisions. A negative result may occur with  improper specimen collection/handling, submission of specimen other than nasopharyngeal swab, presence of viral mutation(s) within the areas targeted by this assay, and inadequate number of viral copies(<138 copies/mL). A negative result must be combined with clinical observations, patient history, and epidemiological information. The expected result is Negative.  Fact Sheet for Patients:  EntrepreneurPulse.com.au  Fact Sheet for Healthcare Providers:  IncredibleEmployment.be  This test is no t yet approved or cleared by the [place] FDA and  has been authorized for detection and/or diagnosis of SARS-CoV-2 by FDA under an Emergency Use Authorization (EUA). This EUA will remain  in effect (meaning this test can be used) for the duration of the COVID-19 declaration under Section 564(b)(1) of the Act, 21 U.S.C.section 360bbb-3(b)(1), unless the authorization is terminated  or revoked sooner.       Influenza A by PCR NEGATIVE NEGATIVE Final   Influenza B by PCR NEGATIVE NEGATIVE Final    Comment: (NOTE) The Xpert Xpress SARS-CoV-2/FLU/RSV plus assay is intended as an aid in the diagnosis of influenza from Nasopharyngeal swab specimens and should not be used as a sole basis for treatment. Nasal washings and aspirates are unacceptable for Xpert Xpress SARS-CoV-2/FLU/RSV testing.  Fact Sheet for Patients: EntrepreneurPulse.com.au  Fact Sheet for Healthcare Providers: IncredibleEmployment.be  This test is not yet approved or  cleared by the [place] FDA and has been authorized for detection and/or diagnosis of SARS-CoV-2 by FDA under an Emergency Use Authorization (EUA). This EUA will remain in effect (meaning this test can be used) for the duration of the COVID-19 declaration under Section 564(b)(1) of the Act, 21 U.S.C. section 360bbb-3(b)(1), unless the authorization is terminated or revoked.  Performed at [facility], [place] [address], [place], [place] [address]   Urine Culture     Status: Abnormal   Collection Time: 02/13/22 10:56 PM  Specimen: Urine, Clean Catch  Result Value Ref Range Status   Specimen Description URINE, CLEAN CATCH  Final   Special Requests NONE  Final   Culture (A)  Final    80,000 COLONIES/mL AEROCOCCUS SPECIES Standardized susceptibility testing for this organism is not available. Performed at [facility], [place] [address], [place], [place] [address]    Report Status 02/15/2022 FINAL  Final  Gastrointestinal Panel by PCR , Stool     Status: None   Collection Time: 02/14/22 11:53 AM   Specimen: Stool  Result Value Ref Range Status   Campylobacter species NOT DETECTED NOT DETECTED Final   Plesimonas shigelloides NOT DETECTED NOT DETECTED Final   Salmonella species NOT DETECTED NOT DETECTED Final   Yersinia enterocolitica NOT DETECTED NOT DETECTED Final   Vibrio species NOT DETECTED NOT DETECTED Final   Vibrio cholerae NOT DETECTED NOT DETECTED Final   Enteroaggregative E coli (EAEC) NOT DETECTED NOT DETECTED Final   Enteropathogenic E coli (EPEC) NOT DETECTED NOT DETECTED Final   Enterotoxigenic E coli (ETEC) NOT DETECTED NOT DETECTED Final   Shiga like toxin producing E coli (STEC) NOT DETECTED NOT DETECTED Final   Shigella/Enteroinvasive E coli (EIEC) NOT DETECTED NOT DETECTED Final   Cryptosporidium NOT DETECTED NOT DETECTED Final   Cyclospora cayetanensis NOT DETECTED NOT DETECTED Final   Entamoeba histolytica NOT DETECTED NOT DETECTED Final   Giardia  lamblia NOT DETECTED NOT DETECTED Final   Adenovirus F40/41 NOT DETECTED NOT DETECTED Final   Astrovirus NOT DETECTED NOT DETECTED Final   Norovirus GI/GII NOT DETECTED NOT DETECTED Final   Rotavirus A NOT DETECTED NOT DETECTED Final   Sapovirus (I, II, IV, and V) NOT DETECTED NOT DETECTED Final    Comment: Performed at [facility], [address] [place], [place] [address]  C Difficile Quick Screen w PCR reflex     Status: None   Collection Time: 02/14/22 11:53 AM   Specimen: Stool  Result Value Ref Range Status   C Diff antigen NEGATIVE NEGATIVE Final   C Diff toxin NEGATIVE NEGATIVE Final   C Diff interpretation No C. difficile detected.  Final    Comment: Performed at [facility], [place] [address], [place], [place] [address]  MRSA Next Gen by PCR, Nasal     Status: None   Collection Time: 02/14/22  9:45 PM   Specimen: Nasal Mucosa; Nasal Swab  Result Value Ref Range Status   MRSA by PCR Next Gen NOT DETECTED NOT DETECTED Final    Comment: (NOTE) The GeneXpert MRSA Assay (FDA approved for NASAL specimens only), is one component of a comprehensive MRSA colonization surveillance program. It is not intended to diagnose MRSA infection nor to guide or monitor treatment for MRSA infections. Test performance is not FDA approved in patients less than 26 years old. Performed at [facility], [place] [address], [place], [place] [address]       Radiology Studies: No results found.  Scheduled Meds:  amLODipine  10 mg Oral Daily   atorvastatin  20 mg Oral Daily   budesonide  0.25 mg Nebulization BID   carvedilol  25 mg Oral BID WC   DULoxetine  60 mg Oral Daily   enoxaparin (LOVENOX) injection  40 mg Subcutaneous Q24H   hydrALAZINE  100 mg Oral TID   insulin aspart  0-9 Units Subcutaneous TID WC   insulin glargine-yfgn  35 Units Subcutaneous QHS   Continuous Infusions:  sodium chloride 100  mL/hr at 02/16/22 0103   cefTRIAXone (ROCEPHIN)  IV 2 g (02/15/22  2130)   metronidazole 500 mg (02/16/22 1009)   promethazine (PHENERGAN) injection (IM or IVPB) 12.5 mg (02/16/22 0913)     LOS: 3 days   Time spent: 25 minutes  Darliss Cheney, MD [facility]  02/16/2022, 10:27 AM  Please page via [facility] and do not message via secure chat for urgent patient care matters. Secure chat can be used for non urgent patient care matters.  How to contact the [facility] Attending or Consulting provider [place] or covering provider during after hours [place], for this patient?  Check the care team in [facility] and look for a) attending/consulting [facility] provider listed and b) the [facility] team listed. Page or secure chat [phone]. Log into [email or web address] and use [place]'s universal password to access. If you do not have the password, please contact the hospital operator. Locate the [facility] provider you are looking for under [facility] and page to a number that you can be directly reached. If you still have difficulty reaching the provider, please page the [facility] (Director on Call) for the Hospitalists listed on amion for assistance.

## 2022-02-17 ENCOUNTER — Other Ambulatory Visit (HOSPITAL_COMMUNITY): Payer: Self-pay

## 2022-02-17 LAB — CBC WITH DIFFERENTIAL/PLATELET
Abs Immature Granulocytes: 0.03 10*3/uL (ref 0.00–0.07)
Basophils Absolute: 0 10*3/uL (ref 0.0–0.1)
Basophils Relative: 0 %
Eosinophils Absolute: 0.2 10*3/uL (ref 0.0–0.5)
Eosinophils Relative: 2 %
HCT: 31.6 % — ABNORMAL LOW (ref 36.0–46.0)
Hemoglobin: 9.9 g/dL — ABNORMAL LOW (ref 12.0–15.0)
Immature Granulocytes: 0 %
Lymphocytes Relative: 35 %
Lymphs Abs: 3.6 10*3/uL (ref 0.7–4.0)
MCH: 28.2 pg (ref 26.0–34.0)
MCHC: 31.3 g/dL (ref 30.0–36.0)
MCV: 90 fL (ref 80.0–100.0)
Monocytes Absolute: 1 10*3/uL (ref 0.1–1.0)
Monocytes Relative: 10 %
Neutro Abs: 5.5 10*3/uL (ref 1.7–7.7)
Neutrophils Relative %: 53 %
Platelets: 226 10*3/uL (ref 150–400)
RBC: 3.51 MIL/uL — ABNORMAL LOW (ref 3.87–5.11)
RDW: 14 % (ref 11.5–15.5)
WBC: 10.4 10*3/uL (ref 4.0–10.5)
nRBC: 0 % (ref 0.0–0.2)

## 2022-02-17 LAB — BASIC METABOLIC PANEL
Anion gap: 9 (ref 5–15)
BUN: 28 mg/dL — ABNORMAL HIGH (ref 6–20)
CO2: 22 mmol/L (ref 22–32)
Calcium: 8.5 mg/dL — ABNORMAL LOW (ref 8.9–10.3)
Chloride: 105 mmol/L (ref 98–111)
Creatinine, Ser: 2.53 mg/dL — ABNORMAL HIGH (ref 0.44–1.00)
GFR, Estimated: 22 mL/min — ABNORMAL LOW (ref >60)
Glucose, Bld: 166 mg/dL — ABNORMAL HIGH (ref 70–99)
Potassium: 3.2 mmol/L — ABNORMAL LOW (ref 3.5–5.1)
Sodium: 136 mmol/L (ref 135–145)

## 2022-02-17 LAB — GLUCOSE, CAPILLARY
Glucose-Capillary: 101 mg/dL — ABNORMAL HIGH (ref 70–99)
Glucose-Capillary: 147 mg/dL — ABNORMAL HIGH (ref 70–99)

## 2022-02-17 MED ORDER — AMOXICILLIN 500 MG PO CAPS
500.0000 mg | ORAL_CAPSULE | Freq: Three times a day (TID) | ORAL | 0 refills | Status: AC
  Filled 2022-02-17: qty 12, 4d supply, fill #0

## 2022-02-17 MED ORDER — DULOXETINE HCL 60 MG PO CPEP
60.0000 mg | ORAL_CAPSULE | Freq: Every day | ORAL | 0 refills | Status: DC
  Filled 2022-02-17: qty 90, 90d supply, fill #0

## 2022-02-17 MED ORDER — INSULIN LISPRO (1 UNIT DIAL) 100 UNIT/ML (KWIKPEN)
16.0000 [IU] | PEN_INJECTOR | Freq: Three times a day (TID) | SUBCUTANEOUS | 1 refills | Status: DC
  Filled 2022-02-17: qty 15, 31d supply, fill #0

## 2022-02-17 MED ORDER — CARVEDILOL 25 MG PO TABS
25.0000 mg | ORAL_TABLET | Freq: Two times a day (BID) | ORAL | 0 refills | Status: DC
  Filled 2022-02-17: qty 60, 30d supply, fill #0

## 2022-02-17 MED ORDER — HYDRALAZINE HCL 100 MG PO TABS
100.0000 mg | ORAL_TABLET | Freq: Three times a day (TID) | ORAL | 0 refills | Status: DC
  Filled 2022-02-17: qty 90, 30d supply, fill #0

## 2022-02-17 MED ORDER — AMLODIPINE BESYLATE 10 MG PO TABS
10.0000 mg | ORAL_TABLET | Freq: Every day | ORAL | 0 refills | Status: DC
  Filled 2022-02-17: qty 30, 30d supply, fill #0

## 2022-02-17 MED ORDER — INSULIN PEN NEEDLE 32G X 4 MM MISC
0 refills | Status: DC
  Filled 2022-02-17: qty 100, 25d supply, fill #0

## 2022-02-17 MED ORDER — ATORVASTATIN CALCIUM 20 MG PO TABS
20.0000 mg | ORAL_TABLET | Freq: Every day | ORAL | 0 refills | Status: DC
  Filled 2022-02-17: qty 30, 30d supply, fill #0

## 2022-02-17 MED ORDER — LOPERAMIDE HCL 2 MG PO CAPS: 2.0000 mg | ORAL_CAPSULE | ORAL | Status: DC | PRN

## 2022-02-17 MED ORDER — PANTOPRAZOLE SODIUM 40 MG PO TBEC
40.0000 mg | DELAYED_RELEASE_TABLET | Freq: Two times a day (BID) | ORAL | 0 refills | Status: DC
  Filled 2022-02-17: qty 60, 30d supply, fill #0

## 2022-02-17 MED ORDER — PROMETHAZINE HCL 25 MG PO TABS
25.0000 mg | ORAL_TABLET | Freq: Four times a day (QID) | ORAL | 0 refills | Status: DC | PRN
  Filled 2022-02-17: qty 20, 5d supply, fill #0

## 2022-02-17 MED ORDER — CLONAZEPAM 0.5 MG PO TABS
0.2500 mg | ORAL_TABLET | Freq: Every day | ORAL | 0 refills | Status: DC
  Filled 2022-02-17: qty 15, 30d supply, fill #0

## 2022-02-17 MED ORDER — AMOXICILLIN 500 MG PO CAPS
500.0000 mg | ORAL_CAPSULE | Freq: Three times a day (TID) | ORAL | Status: DC
  Administered 2022-02-17: 08:00:00 500 mg via ORAL
  Filled 2022-02-17 (×3): qty 1

## 2022-02-17 MED ORDER — LOPERAMIDE HCL 2 MG PO CAPS
2.0000 mg | ORAL_CAPSULE | ORAL | 0 refills | Status: DC | PRN
  Filled 2022-02-17: qty 30, 5d supply, fill #0

## 2022-02-17 MED ORDER — LOSARTAN POTASSIUM 50 MG PO TABS
50.0000 mg | ORAL_TABLET | Freq: Every day | ORAL | 0 refills | Status: DC
  Filled 2022-02-17: qty 30, 30d supply, fill #0

## 2022-02-17 MED ORDER — POTASSIUM CHLORIDE CRYS ER 20 MEQ PO TBCR
40.0000 meq | EXTENDED_RELEASE_TABLET | Freq: Once | ORAL | Status: AC
  Administered 2022-02-17: 09:00:00 40 meq via ORAL
  Filled 2022-02-17: qty 2

## 2022-02-17 NOTE — TOC Transition Note (Signed)
Transition of Care (TOC) - CM/SW Discharge Note ? ? ?Patient Details  ?Name: Maureen Jones ?MRN: [id number] ?Date of Birth: Aug 15, 1966 ? ?Transition of Care (TOC) CM/SW Contact:  ?Angelita Ingles, RN ?Phone Number:[phone] ? ?02/17/2022, 12:00 PM ? ? ?Clinical Narrative:    ?Patient discharging home. No TOC needs noted at this time. TOC will sign off.  ? ? ?  ?  ? ? ?Patient Goals and CMS Choice ?  ?  ?  ? ?Discharge Placement ?  ?           ?  ?  ?  ?  ? ?Discharge Plan and Services ?  ?  ?           ?  ?  ?  ?  ?  ?  ?  ?  ?  ?  ? ?Social Determinants of Health (SDOH) Interventions ?  ? ? ?Readmission Risk Interventions ?Readmission Risk Prevention Plan 12/13/2021 11/15/2021  ?Transportation Screening Complete Complete  ?PCP or Specialist Appt within 3-5 Days Complete Complete  ?[place] or Home Care Consult Complete Complete  ?Social Work Consult for [place] Planning/Counseling Complete Patient refused  ?Palliative Care Screening Not Applicable Not Applicable  ?Medication Review Press photographer) Complete Complete  ? ? ? ? ? ?

## 2022-02-17 NOTE — Plan of Care (Signed)

## 2022-02-17 NOTE — Discharge Summary (Signed)
Physician Discharge Summary  Maureen Jones OJJ:[id number] DOB: [birth year] DOA: 02/13/2022  PCP: Flossie Buffy, NP  Admit date: 02/13/2022 Discharge date: 02/17/2022  Admitted From: Home Disposition: Home  Recommendations for Outpatient Follow-up:  Follow up with PCP in 1-2 weeks Please obtain BMP/CBC in one week Monitor your blood pressures at home and bring it to doctors office for follow-up.  Home Health: Not applicable Equipment/Devices: Tracheostomy and oxygen supplies present at home.  Discharge Condition: Stable CODE STATUS: Partial code Diet recommendation: Low-salt and low-carb diet.  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  She was also out of her blood pressure medications and not taken it for 2 weeks.  She was also out of prandial insulin.  In the emergency room markedly elevated blood pressure with systolic more than 937.  Tachycardic.  CT scan abdomen pelvis showed perinephric stranding and pancolitis.  Urine was abnormal.  Hypertensive urgency: Due to lack of home medications.  Resumed amlodipine, Coreg, losartan and hydralazine.  Blood pressure is still slightly elevated, however she has not taken medicine for 2 weeks.  New prescriptions were sent to the pharmacy.  She will resume all her medications, keep log of blood pressure at home and bring it to doctors office for follow-up.  She may not need more antihypertensives but she may just need to take the medications.  Nausea, vomiting diarrhea with evidence of acute colitis: C. difficile and GI pathogen panel negative.  Nausea improved and have adequate oral intake.  Diarrhea is improving, still has loose stool but less in frequency and volume.   Probably viral gastroenteritis.  Can use Imodium.  Able to keep up with oral hydration.  Acute UTI present on admission, suspected pyelonephritis bilateral: Urinalysis abnormal.  Blood cultures negative.  Urine culture with Aerococcus.  Treated with Rocephin.  Will treat with 4 more days of amoxicillin to continue 7 days of therapy for gram-positive UTI.  CKD stage IV: Baseline creatinine about 2 -2.5.  Remained stable.  Type 2 diabetes mellitus with hyperglycemia: Not taking prandial insulin at home.  Prescribed.  Blood sugars acceptable today.  Tracheostomy status: Stable.  Electrolytes: Potassium replaced before discharge.  Stable to discharge home.  She has adequate support system at home.    Discharge Diagnoses:  Principal Problem:   Hypertensive urgency Active Problems:   Vocal cord dysfunction   OSA (obstructive sleep apnea)   DM (diabetes mellitus) (HCC)   CKD (chronic kidney disease) stage 4, GFR 15-29 ml/min (HCC)   Nausea vomiting and diarrhea    Discharge Instructions  Discharge Instructions     Call MD for:  persistant nausea and vomiting   Complete by: As directed    Diet - low sodium heart healthy   Complete by: As directed    Diet Carb Modified   Complete by: As directed    Discharge instructions   Complete by: As directed    Can take imodium for diarrhea , 1 tablet 3-4 times a day . Drink plenty of water and hydrate yourself well.   Increase activity slowly   Complete by: As directed       Allergies as of 02/17/2022   No Known Allergies      Medication List     STOP taking these  medications    ondansetron 4 MG tablet Commonly known as: Zofran       TAKE these medications    albuterol (2.5 MG/3ML) 0.083% nebulizer solution Commonly known as: PROVENTIL Use 1 vial (2.5 mg total) by nebulization every 4 (four) hours as needed for wheezing or shortness of breath.   amLODipine 10 MG tablet Commonly known as: NORVASC Take 1 tablet (10 mg  total) by mouth daily.   amoxicillin 500 MG capsule Commonly known as: AMOXIL Take 1 capsule (500 mg total) by mouth 3 (three) times daily for 4 days.   atorvastatin 20 MG tablet Commonly known as: LIPITOR Take 1 tablet (20 mg total) by mouth daily.   budesonide 0.25 MG/2ML nebulizer solution Commonly known as: PULMICORT Use 1 vial (0.25 mg total) by nebulization 2 (two) times daily.   carvedilol 25 MG tablet Commonly known as: COREG Take 1 tablet (25 mg total) by mouth 2 (two) times daily.   clonazePAM 0.5 MG tablet Commonly known as: KLONOPIN Take 1/2 tablet (0.25 mg total) by mouth daily at bedtime.   DULoxetine 60 MG capsule Commonly known as: CYMBALTA Take 1 capsule (60 mg total) by mouth daily.   hydrALAZINE 100 MG tablet Commonly known as: APRESOLINE Take 1 tablet (100 mg total) by mouth 3 (three) times daily.   insulin lispro 100 UNIT/ML KwikPen Commonly known as: HUMALOG Inject 16 Units into the skin 3 (three) times daily with meals.   loperamide 2 MG capsule Commonly known as: IMODIUM Take 1 capsule (2 mg total) by mouth every 4 (four) hours as needed for diarrhea or loose stools.   losartan 50 MG tablet Commonly known as: Cozaar Take 1 tablet (50 mg total) by mouth daily.   pantoprazole 40 MG tablet Commonly known as: PROTONIX Take 1 tablet (40 mg total) by mouth 2 (two) times daily.   promethazine 25 MG tablet Commonly known as: PHENERGAN Take 1 tablet (25 mg total) by mouth every 6 (six) hours as needed for nausea or vomiting.   Tyler Aas FlexTouch 200 UNIT/ML FlexTouch Pen Generic drug: insulin degludec Inject 32 Units into the skin 2 (two) times daily.        No Known Allergies  Consultations: None   Procedures/Studies: CT ABDOMEN PELVIS WO CONTRAST  Result Date: 02/13/2022 CLINICAL DATA:  Acute abdominal pain.  Vomiting and diarrhea. EXAM: CT ABDOMEN AND PELVIS WITHOUT CONTRAST TECHNIQUE: Multidetector CT imaging of the abdomen and  pelvis was performed following the standard protocol without IV contrast. RADIATION DOSE REDUCTION: This exam was performed according to the departmental dose-optimization program which includes automated exposure control, adjustment of the mA and/or kV according to patient size and/or use of iterative reconstruction technique. COMPARISON:  None. FINDINGS: Lower chest: No acute abnormality. Hepatobiliary: No focal liver abnormality is seen. No gallstones, gallbladder wall thickening, or biliary dilatation. Pancreas: Unremarkable. No pancreatic ductal dilatation or surrounding inflammatory changes. Spleen: Normal in size without focal abnormality. Adrenals/Urinary Tract: There is no evidence for hydronephrosis or urinary tract calculus. Kidneys are normal in size. There is bilateral periureteral and perinephric stranding. Bladder is within normal limits. Adrenal glands are within normal limits. Stomach/Bowel: Stomach is within normal limits. Appendix appears normal. No evidence for bowel obstruction or free air. There is stranding and fluid surrounding the descending colon concerning for colitis. Vascular/Lymphatic: No significant vascular findings are present. No enlarged abdominal or pelvic lymph nodes. Reproductive: Status post hysterectomy. No adnexal masses. Other: There is mild stranding and fluid tracking along the bilateral  retroperitoneal regions. There is no free air or focal abdominal wall hernia. There is mild body wall edema. Musculoskeletal: No acute or significant osseous findings. IMPRESSION: 1. Bilateral perinephric and Peri ureteral stranding and fluid may be related to inflammatory or infectious process. No urinary tract calculus or hydronephrosis. 2. Fluid and stranding surrounding the descending colon concerning for nonspecific colitis. 3. Mild body wall edema. Electronically Signed   By: Ronney Asters M.D.   On: 02/13/2022 21:53   DG Chest Port 1 View  Result Date: 02/13/2022 CLINICAL DATA:   cough EXAM: PORTABLE CHEST 1 VIEW COMPARISON:  January 14, 2022 FINDINGS: The cardiomediastinal silhouette is unchanged in contour.Tracheostomy. No pleural effusion. No pneumothorax. Low lung volumes. No acute pleuroparenchymal abnormality. Visualized abdomen is unremarkable. IMPRESSION: No acute cardiopulmonary abnormality. Electronically Signed   By: Valentino Saxon M.D.   On: 02/13/2022 21:00   VAS [place] LOWER EXTREMITY VENOUS (DVT)  Result Date: 02/14/2022  Lower Venous DVT Study Patient Name:  MAYTHE DERAMO  Date of Exam:   02/14/2022 Medical Rec #: [id number]          Accession #:    [id number] Date of Birth: 12-Nov-1966           Patient Gender: F Patient Age:   16 years Exam Location:  [facility] Procedure:      VAS [place] LOWER EXTREMITY VENOUS (DVT) Referring Phys: Gean Birchwood --------------------------------------------------------------------------------  Indications: Edema.  Comparison Study: no prior Performing Technologist: Archie Patten RVS  Examination Guidelines: A complete evaluation includes B-mode imaging, spectral Doppler, color Doppler, and power Doppler as needed of all accessible portions of each vessel. Bilateral testing is considered an integral part of a complete examination. Limited examinations for reoccurring indications may be performed as noted. The reflux portion of the exam is performed with the patient in reverse Trendelenburg.  +---------+---------------+---------+-----------+----------+--------------+  RIGHT     Compressibility Phasicity Spontaneity Properties Thrombus Aging  +---------+---------------+---------+-----------+----------+--------------+  CFV       Full            Yes       Yes                                    +---------+---------------+---------+-----------+----------+--------------+  SFJ       Full                                                             +---------+---------------+---------+-----------+----------+--------------+  FV Prox   Full                                                              +---------+---------------+---------+-----------+----------+--------------+  FV Mid    Full                                                             +---------+---------------+---------+-----------+----------+--------------+  FV Distal Full                                                             +---------+---------------+---------+-----------+----------+--------------+  PFV       Full                                                             +---------+---------------+---------+-----------+----------+--------------+  POP       Full            Yes       Yes                                    +---------+---------------+---------+-----------+----------+--------------+  PTV       Full                                                             +---------+---------------+---------+-----------+----------+--------------+  PERO      Full                                                             +---------+---------------+---------+-----------+----------+--------------+   +---------+---------------+---------+-----------+----------+--------------+  LEFT      Compressibility Phasicity Spontaneity Properties Thrombus Aging  +---------+---------------+---------+-----------+----------+--------------+  CFV       Full            Yes       Yes                                    +---------+---------------+---------+-----------+----------+--------------+  SFJ       Full                                                             +---------+---------------+---------+-----------+----------+--------------+  FV Prox   Full                                                             +---------+---------------+---------+-----------+----------+--------------+  FV Mid    Full                                                             +---------+---------------+---------+-----------+----------+--------------+  FV Distal Full                                                              +---------+---------------+---------+-----------+----------+--------------+  PFV       Full                                                             +---------+---------------+---------+-----------+----------+--------------+  POP       Full            Yes       Yes                                    +---------+---------------+---------+-----------+----------+--------------+  PTV       Full                                                             +---------+---------------+---------+-----------+----------+--------------+  PERO      Full                                                             +---------+---------------+---------+-----------+----------+--------------+     Summary: BILATERAL: - No evidence of deep vein thrombosis seen in the lower extremities, bilaterally. -No evidence of popliteal cyst, bilaterally.   *See table(s) above for measurements and observations. Electronically signed by Servando Snare MD on 02/14/2022 at 5:12:16 PM.    Final    (Echo, Carotid, EGD, Colonoscopy, ERCP)    Subjective: Patient seen and examined.  She had 3 or 4 small volume watery diarrhea overnight.  Denies any nausea vomiting.  She wants to go home.  She thinks he can manage her symptoms at home. She has run out of all medications except long-acting insulin.  Prescription sent to [facility].  Also prescribed short course of Klonopin that she is using as needed for anxiety. We discussed in details about monitoring blood pressures in ambulatory settings and taking medications.  Patient is agreeable.   Discharge Exam: Vitals:   02/17/22 0733 02/17/22 0755  BP: (!) 188/90 (!) 188/90  Pulse: 88 94  Resp: 11 17  Temp: 98.4 F (36.9 C)   SpO2: 94% 99%   Vitals:   02/17/22 0330 02/17/22 0400 02/17/22 0733 02/17/22 0755  BP: (!) 187/86 (!) 190/84 (!) 188/90 (!) 188/90  Pulse: 82  88 94  Resp: 13  11 17   Temp: 98.4 F (36.9 C)  98.4 F (36.9 C)   TempSrc: Oral     SpO2: 96%  94% 99%   Weight:      Height:        General: Pt is alert,  awake, not in acute distress On trach collar , 5 liters of oxygen.  Cardiovascular: RRR, S1/S2 +, no rubs, no gallops Respiratory: CTA bilaterally, no wheezing, no rhonchi, conducted sounds from trach  Abdominal: Soft, NT, ND, bowel sounds + Extremities: 1+ pedal edema bilateral legs , non tender     The results of significant diagnostics from this hospitalization (including imaging, microbiology, ancillary and laboratory) are listed below for reference.     Microbiology: Recent Results (from the past 240 hour(s))  Resp Panel by RT-PCR (Flu A&B, Covid) Nasopharyngeal Swab     Status: None   Collection Time: 02/13/22  9:26 PM   Specimen: Nasopharyngeal Swab; Nasopharyngeal(NP) swabs in vial transport medium  Result Value Ref Range Status   SARS Coronavirus 2 by RT PCR NEGATIVE NEGATIVE Final    Comment: (NOTE) SARS-CoV-2 target nucleic acids are NOT DETECTED.  The SARS-CoV-2 RNA is generally detectable in upper respiratory specimens during the acute phase of infection. The lowest concentration of SARS-CoV-2 viral copies this assay can detect is 138 copies/mL. A negative result does not preclude SARS-Cov-2 infection and should not be used as the sole basis for treatment or other patient management decisions. A negative result may occur with  improper specimen collection/handling, submission of specimen other than nasopharyngeal swab, presence of viral mutation(s) within the areas targeted by this assay, and inadequate number of viral copies(<138 copies/mL). A negative result must be combined with clinical observations, patient history, and epidemiological information. The expected result is Negative.  Fact Sheet for Patients:  EntrepreneurPulse.com.au  Fact Sheet for Healthcare Providers:  IncredibleEmployment.be  This test is no t yet approved or cleared by the [place] FDA and   has been authorized for detection and/or diagnosis of SARS-CoV-2 by FDA under an Emergency Use Authorization (EUA). This EUA will remain  in effect (meaning this test can be used) for the duration of the COVID-19 declaration under Section 564(b)(1) of the Act, 21 U.S.C.section 360bbb-3(b)(1), unless the authorization is terminated  or revoked sooner.       Influenza A by PCR NEGATIVE NEGATIVE Final   Influenza B by PCR NEGATIVE NEGATIVE Final    Comment: (NOTE) The Xpert Xpress SARS-CoV-2/FLU/RSV plus assay is intended as an aid in the diagnosis of influenza from Nasopharyngeal swab specimens and should not be used as a sole basis for treatment. Nasal washings and aspirates are unacceptable for Xpert Xpress SARS-CoV-2/FLU/RSV testing.  Fact Sheet for Patients: EntrepreneurPulse.com.au  Fact Sheet for Healthcare Providers: IncredibleEmployment.be  This test is not yet approved or cleared by the [place] FDA and has been authorized for detection and/or diagnosis of SARS-CoV-2 by FDA under an Emergency Use Authorization (EUA). This EUA will remain in effect (meaning this test can be used) for the duration of the COVID-19 declaration under Section 564(b)(1) of the Act, 21 U.S.C. section 360bbb-3(b)(1), unless the authorization is terminated or revoked.  Performed at [facility], [place] [address], [place], [place] [address]   Urine Culture     Status: Abnormal   Collection Time: 02/13/22 10:56 PM   Specimen: Urine, Clean Catch  Result Value Ref Range Status   Specimen Description URINE, CLEAN CATCH  Final   Special Requests NONE  Final   Culture (A)  Final    80,000 COLONIES/mL AEROCOCCUS SPECIES Standardized susceptibility testing for this organism is not available. Performed at [facility], [place] [address] [place], [place] [address]    Report Status 02/15/2022 FINAL  Final  Gastrointestinal Panel by PCR , Stool      Status: None   Collection Time: 02/14/22 11:53 AM   Specimen: Stool  Result Value Ref Range Status   Campylobacter species NOT DETECTED NOT DETECTED Final   Plesimonas shigelloides NOT DETECTED NOT DETECTED Final   Salmonella species NOT DETECTED NOT DETECTED Final   Yersinia enterocolitica NOT DETECTED NOT DETECTED Final   Vibrio species NOT DETECTED NOT DETECTED Final   Vibrio cholerae NOT DETECTED NOT DETECTED Final   Enteroaggregative E coli (EAEC) NOT DETECTED NOT DETECTED Final   Enteropathogenic E coli (EPEC) NOT DETECTED NOT DETECTED Final   Enterotoxigenic E coli (ETEC) NOT DETECTED NOT DETECTED Final   Shiga like toxin producing E coli (STEC) NOT DETECTED NOT DETECTED Final   Shigella/Enteroinvasive E coli (EIEC) NOT DETECTED NOT DETECTED Final   Cryptosporidium NOT DETECTED NOT DETECTED Final   Cyclospora cayetanensis NOT DETECTED NOT DETECTED Final   Entamoeba histolytica NOT DETECTED NOT DETECTED Final   Giardia lamblia NOT DETECTED NOT DETECTED Final   Adenovirus F40/41 NOT DETECTED NOT DETECTED Final   Astrovirus NOT DETECTED NOT DETECTED Final   Norovirus GI/GII NOT DETECTED NOT DETECTED Final   Rotavirus A NOT DETECTED NOT DETECTED Final   Sapovirus (I, II, IV, and V) NOT DETECTED NOT DETECTED Final    Comment: Performed at [facility], [place] [place], [place] [address]  C Difficile Quick Screen w PCR reflex     Status: None   Collection Time: 02/14/22 11:53 AM   Specimen: Stool  Result Value Ref Range Status   C Diff antigen NEGATIVE NEGATIVE Final   C Diff toxin NEGATIVE NEGATIVE Final   C Diff interpretation No C. difficile detected.  Final    Comment: Performed at [facility], [place] [address] [place], [place] [address]  MRSA Next Gen by PCR, Nasal     Status: None   Collection Time: 02/14/22  9:45 PM   Specimen: Nasal Mucosa; Nasal Swab  Result Value Ref Range Status   MRSA by PCR Next Gen NOT DETECTED NOT DETECTED Final     Comment: (NOTE) The GeneXpert MRSA Assay (FDA approved for NASAL specimens only), is one component of a comprehensive MRSA colonization surveillance program. It is not intended to diagnose MRSA infection nor to guide or monitor treatment for MRSA infections. Test performance is not FDA approved in patients less than 79 years old. Performed at [facility], [place] [address], [place], [place] [address]      Labs: BNP (last 3 results) Recent Labs    10/01/21 0438 12/23/21 1604 02/14/22 0537  BNP 48.7 84.4 160.7*   Basic Metabolic Panel: Recent Labs  Lab 02/13/22 2100 02/14/22 0537 02/15/22 0055 02/16/22 0050 02/17/22 0042  NA 136 136 132* 134* 136  K 3.8 3.6 3.5 3.8 3.2*  CL 99 101 101 103 105  CO2 23 24 22  19* 22  GLUCOSE 370* 369* 214* 209* 166*  BUN 23* 24* 29* 32* 28*  CREATININE 2.69* 2.70* 2.78* 2.55* 2.53*  CALCIUM 9.7 9.0 8.6* 8.2* 8.5*  MG  --   --  1.4*  --   --    Liver Function Tests: Recent Labs  Lab 02/13/22 2100 02/14/22 0537  AST 18 18  ALT 12 11  ALKPHOS 125 108  BILITOT 0.3 0.6  PROT 6.2* 6.0*  ALBUMIN 2.7* 2.6*   Recent Labs  Lab 02/13/22 2100  LIPASE 31   No results for  input(s): AMMONIA in the last 168 hours. CBC: Recent Labs  Lab 02/13/22 2100 02/14/22 0537 02/17/22 0042  WBC 14.5* 17.4* 10.4  NEUTROABS 12.5*  --  5.5  HGB 11.5* 10.3* 9.9*  HCT 35.0* 33.0* 31.6*  MCV 89.3 90.7 90.0  PLT 307 285 226   Cardiac Enzymes: No results for input(s): CKTOTAL, CKMB, CKMBINDEX, TROPONINI in the last 168 hours. BNP: Invalid input(s): POCBNP CBG: Recent Labs  Lab 02/16/22 0610 02/16/22 1149 02/16/22 1616 02/16/22 2124 02/17/22 0609  GLUCAP 95 100* 110* 159* 101*   D-Dimer No results for input(s): DDIMER in the last 72 hours. Hgb A1c No results for input(s): HGBA1C in the last 72 hours. Lipid Profile No results for input(s): CHOL, HDL, LDLCALC, TRIG, CHOLHDL, LDLDIRECT in the last 72 hours. Thyroid function studies No  results for input(s): TSH, T4TOTAL, T3FREE, THYROIDAB in the last 72 hours.  Invalid input(s): FREET3 Anemia work up No results for input(s): VITAMINB12, FOLATE, FERRITIN, TIBC, IRON, RETICCTPCT in the last 72 hours. Urinalysis    Component Value Date/Time   COLORURINE YELLOW 02/13/2022 2256   APPEARANCEUR HAZY (A) 02/13/2022 2256   LABSPEC 1.015 02/13/2022 2256   PHURINE 7.5 02/13/2022 2256   GLUCOSEU >=500 (A) 02/13/2022 2256   HGBUR MODERATE (A) 02/13/2022 2256   BILIRUBINUR NEGATIVE 02/13/2022 2256   KETONESUR 15 (A) 02/13/2022 2256   PROTEINUR >300 (A) 02/13/2022 2256   NITRITE NEGATIVE 02/13/2022 2256   LEUKOCYTESUR NEGATIVE 02/13/2022 2256   Sepsis Labs Invalid input(s): PROCALCITONIN,  WBC,  LACTICIDVEN Microbiology Recent Results (from the past 240 hour(s))  Resp Panel by RT-PCR (Flu A&B, Covid) Nasopharyngeal Swab     Status: None   Collection Time: 02/13/22  9:26 PM   Specimen: Nasopharyngeal Swab; Nasopharyngeal(NP) swabs in vial transport medium  Result Value Ref Range Status   SARS Coronavirus 2 by RT PCR NEGATIVE NEGATIVE Final    Comment: (NOTE) SARS-CoV-2 target nucleic acids are NOT DETECTED.  The SARS-CoV-2 RNA is generally detectable in upper respiratory specimens during the acute phase of infection. The lowest concentration of SARS-CoV-2 viral copies this assay can detect is 138 copies/mL. A negative result does not preclude SARS-Cov-2 infection and should not be used as the sole basis for treatment or other patient management decisions. A negative result may occur with  improper specimen collection/handling, submission of specimen other than nasopharyngeal swab, presence of viral mutation(s) within the areas targeted by this assay, and inadequate number of viral copies(<138 copies/mL). A negative result must be combined with clinical observations, patient history, and epidemiological information. The expected result is Negative.  Fact Sheet for  Patients:  EntrepreneurPulse.com.au  Fact Sheet for Healthcare Providers:  IncredibleEmployment.be  This test is no t yet approved or cleared by the [place] FDA and  has been authorized for detection and/or diagnosis of SARS-CoV-2 by FDA under an Emergency Use Authorization (EUA). This EUA will remain  in effect (meaning this test can be used) for the duration of the COVID-19 declaration under Section 564(b)(1) of the Act, 21 U.S.C.section 360bbb-3(b)(1), unless the authorization is terminated  or revoked sooner.       Influenza A by PCR NEGATIVE NEGATIVE Final   Influenza B by PCR NEGATIVE NEGATIVE Final    Comment: (NOTE) The Xpert Xpress SARS-CoV-2/FLU/RSV plus assay is intended as an aid in the diagnosis of influenza from Nasopharyngeal swab specimens and should not be used as a sole basis for treatment. Nasal washings and aspirates are unacceptable for Xpert Xpress SARS-CoV-2/FLU/RSV  testing.  Fact Sheet for Patients: EntrepreneurPulse.com.au  Fact Sheet for Healthcare Providers: IncredibleEmployment.be  This test is not yet approved or cleared by the [place] FDA and has been authorized for detection and/or diagnosis of SARS-CoV-2 by FDA under an Emergency Use Authorization (EUA). This EUA will remain in effect (meaning this test can be used) for the duration of the COVID-19 declaration under Section 564(b)(1) of the Act, 21 U.S.C. section 360bbb-3(b)(1), unless the authorization is terminated or revoked.  Performed at [facility], [place] [address] [place], [place] [address]   Urine Culture     Status: Abnormal   Collection Time: 02/13/22 10:56 PM   Specimen: Urine, Clean Catch  Result Value Ref Range Status   Specimen Description URINE, CLEAN CATCH  Final   Special Requests NONE  Final   Culture (A)  Final    80,000 COLONIES/mL AEROCOCCUS SPECIES Standardized susceptibility  testing for this organism is not available. Performed at [facility], [place] [address] [place], [place] [address]    Report Status 02/15/2022 FINAL  Final  Gastrointestinal Panel by PCR , Stool     Status: None   Collection Time: 02/14/22 11:53 AM   Specimen: Stool  Result Value Ref Range Status   Campylobacter species NOT DETECTED NOT DETECTED Final   Plesimonas shigelloides NOT DETECTED NOT DETECTED Final   Salmonella species NOT DETECTED NOT DETECTED Final   Yersinia enterocolitica NOT DETECTED NOT DETECTED Final   Vibrio species NOT DETECTED NOT DETECTED Final   Vibrio cholerae NOT DETECTED NOT DETECTED Final   Enteroaggregative E coli (EAEC) NOT DETECTED NOT DETECTED Final   Enteropathogenic E coli (EPEC) NOT DETECTED NOT DETECTED Final   Enterotoxigenic E coli (ETEC) NOT DETECTED NOT DETECTED Final   Shiga like toxin producing E coli (STEC) NOT DETECTED NOT DETECTED Final   Shigella/Enteroinvasive E coli (EIEC) NOT DETECTED NOT DETECTED Final   Cryptosporidium NOT DETECTED NOT DETECTED Final   Cyclospora cayetanensis NOT DETECTED NOT DETECTED Final   Entamoeba histolytica NOT DETECTED NOT DETECTED Final   Giardia lamblia NOT DETECTED NOT DETECTED Final   Adenovirus F40/41 NOT DETECTED NOT DETECTED Final   Astrovirus NOT DETECTED NOT DETECTED Final   Norovirus GI/GII NOT DETECTED NOT DETECTED Final   Rotavirus A NOT DETECTED NOT DETECTED Final   Sapovirus (I, II, IV, and V) NOT DETECTED NOT DETECTED Final    Comment: Performed at [facility], [address] [place], [place] [address]  C Difficile Quick Screen w PCR reflex     Status: None   Collection Time: 02/14/22 11:53 AM   Specimen: Stool  Result Value Ref Range Status   C Diff antigen NEGATIVE NEGATIVE Final   C Diff toxin NEGATIVE NEGATIVE Final   C Diff interpretation No C. difficile detected.  Final    Comment: Performed at [facility], [place] [address], [place], [place] [address]  MRSA Next  Gen by PCR, Nasal     Status: None   Collection Time: 02/14/22  9:45 PM   Specimen: Nasal Mucosa; Nasal Swab  Result Value Ref Range Status   MRSA by PCR Next Gen NOT DETECTED NOT DETECTED Final    Comment: (NOTE) The GeneXpert MRSA Assay (FDA approved for NASAL specimens only), is one component of a comprehensive MRSA colonization surveillance program. It is not intended to diagnose MRSA infection nor to guide or monitor treatment for MRSA infections. Test performance is not FDA approved in patients less  than 11 years old. Performed at [facility], [place] [address] [place], [place] [address]      Time coordinating discharge:  35 minutes  SIGNED:   Barb Merino, MD  [facility] 02/17/2022, 8:40 AM

## 2022-02-25 DIAGNOSIS — J45909 Unspecified asthma, uncomplicated: Secondary | ICD-10-CM | POA: Diagnosis not present

## 2022-02-25 DIAGNOSIS — Z930 Tracheostomy status: Secondary | ICD-10-CM | POA: Diagnosis not present

## 2022-02-25 DIAGNOSIS — J383 Other diseases of vocal cords: Secondary | ICD-10-CM | POA: Diagnosis not present

## 2022-03-08 ENCOUNTER — Telehealth: Payer: Self-pay | Admitting: Acute Care

## 2022-03-08 ENCOUNTER — Ambulatory Visit (HOSPITAL_COMMUNITY)
Admission: RE | Admit: 2022-03-08 | Discharge: 2022-03-08 | Disposition: A | Discharge status: Home / Self Care | Payer: Medicare Other | Source: Ambulatory Visit | Attending: Acute Care | Admitting: Acute Care

## 2022-03-08 DIAGNOSIS — Z430 Encounter for attention to tracheostomy: Secondary | ICD-10-CM | POA: Diagnosis not present

## 2022-03-08 DIAGNOSIS — Z930 Tracheostomy status: Secondary | ICD-10-CM | POA: Diagnosis not present

## 2022-03-08 DIAGNOSIS — Z87891 Personal history of nicotine dependence: Secondary | ICD-10-CM | POA: Diagnosis not present

## 2022-03-08 DIAGNOSIS — J45909 Unspecified asthma, uncomplicated: Secondary | ICD-10-CM | POA: Diagnosis not present

## 2022-03-08 DIAGNOSIS — J9601 Acute respiratory failure with hypoxia: Secondary | ICD-10-CM

## 2022-03-08 DIAGNOSIS — I10 Essential (primary) hypertension: Secondary | ICD-10-CM | POA: Insufficient documentation

## 2022-03-08 DIAGNOSIS — J386 Stenosis of larynx: Secondary | ICD-10-CM | POA: Diagnosis not present

## 2022-03-08 DIAGNOSIS — R491 Aphonia: Secondary | ICD-10-CM | POA: Insufficient documentation

## 2022-03-08 DIAGNOSIS — J383 Other diseases of vocal cords: Secondary | ICD-10-CM | POA: Insufficient documentation

## 2022-03-08 DIAGNOSIS — I169 Hypertensive crisis, unspecified: Secondary | ICD-10-CM | POA: Diagnosis not present

## 2022-03-08 NOTE — Progress Notes (Addendum)
Reason for visit ?Tracheostomy change ? 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  Has been in and out of the hospital since tracheostomy placed secondary to intermittent tracheal plugging, dislodgment, as well as her other multiple medical comorbidities such as hypertension, most recently hypertensive crisis.  She presents today for tracheostomy change.  Of note more recently she has had difficulty with phonation, and cannot tolerate Passy-Muir valve.  This was different than when I saw her last in January. ? ?Review of systems ?General: No fever, chills, sick exposure, no new fatigue or weakness.  HEENT: No headache, sinus pressure, increased sinus discharge, tracheostomy site pain.  Has had loss of ability to phonate as mentioned above.  No significant tracheal secretions.  Pulmonary: Denies shortness of breath above baseline, no wheezing, no coughing other than baseline cough with PMV in place ?Cardiac: No chest pain, no palpitations, no significant lower extremity edema.  Abdomen, no nausea vomiting diarrhea GU: At baseline ? ?Physical exam ?Pulse oximetry 100% ?General: Pleasant 56 year old female resting in wheelchair she is in no acute distress ?HEENT normocephalic atraumatic size 6 proximal XLT tracheostomy is unremarkable, tracheostomy site/stoma without significant discharge ?Pulmonary: Clear to auscultation ?Cardiac: Regular rate and rhythm ?Abdomen: Soft nontender ?Extremities: Warm dry ? ?Procedure ?The size 6 proximal XLT tracheostomy was removed, tracheostomy site was evaluated and was unremarkable.  Placed new size 6 tracheostomy was placed without difficulty and placement was verified via end-tidal CO2 ? ?Impression/plan ? 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  Her breathing is not labored as long as there is no PMV in place ? ?Plan ?Continue routine tracheostomy care ?I have placed a referral for ENT to evaluate for subglottic stenosis ?She has had several DME issues.  She should be on home oxygen, she does not have the regulator for the portable device. ?She needs oxygen tubing, I have sent order to have this rectified, I think she would benefit from a respiratory therapy home visit ? ?My time 23 minutes. ?Erick Colace ACNP-BC ?[place] ?Pager # [phone] OR # [phone] if no answer ? ?

## 2022-03-08 NOTE — Progress Notes (Signed)
Tracheostomy Procedure Note ? ?Sarh Kirschenbaum ?[id number] ?June 28, 1966 ? ?Pre Procedure Tracheostomy Information ? ?Trach BrandCristy Hilts ?Size:  6.0 6XLTUP ?Style: Uncuffed ?Secured by: Velcro ? ? ?Procedure: Trach change and Trach cleaning ? ? ? ?Post Procedure Tracheostomy Information ? ?Trach BrandCristy Hilts ?Size:  6.0  6XLTUP ?Style: Uncuffed ?Secured by: Velcro ? ? ?Post Procedure Evaluation: ? ?ETCO2 positive color change from yellow to purple : Yes.   ?Vital signs:VSS ?Patients current condition: stable ?Complications: No apparent complications ?Trach site exam: clean, dry ?Wound care done: 4 x 4 gauze ?Patient did tolerate procedure well. ? ? ?Education: ?none ? ?Prescription needs: ?none ? ? ? ?Additional needs: ?none ? ? ? ? ? ? ?  ?

## 2022-03-09 NOTE — Telephone Encounter (Signed)
We can see the order the way Laurey Arrow put  it in ?

## 2022-03-10 ENCOUNTER — Other Ambulatory Visit (HOSPITAL_COMMUNITY): Payer: Self-pay

## 2022-03-16 DIAGNOSIS — J9600 Acute respiratory failure, unspecified whether with hypoxia or hypercapnia: Secondary | ICD-10-CM | POA: Diagnosis not present

## 2022-03-16 DIAGNOSIS — Z930 Tracheostomy status: Secondary | ICD-10-CM | POA: Diagnosis not present

## 2022-03-16 DIAGNOSIS — J45909 Unspecified asthma, uncomplicated: Secondary | ICD-10-CM | POA: Diagnosis not present

## 2022-03-22 NOTE — Telephone Encounter (Signed)
New orders placed

## 2022-03-22 NOTE — Telephone Encounter (Addendum)
Maureen Jones. I do not see any DME order that was placed nor a home health order.  I do see where an ENT order was placed, but was placed incorrectly.  Will need new orders placed for all 3 concerns.  Referring to triage to get the new orders placed.  ?

## 2022-03-26 DIAGNOSIS — Z930 Tracheostomy status: Secondary | ICD-10-CM | POA: Diagnosis not present

## 2022-03-28 DIAGNOSIS — J45909 Unspecified asthma, uncomplicated: Secondary | ICD-10-CM | POA: Diagnosis not present

## 2022-03-28 DIAGNOSIS — J383 Other diseases of vocal cords: Secondary | ICD-10-CM | POA: Diagnosis not present

## 2022-03-28 DIAGNOSIS — Z930 Tracheostomy status: Secondary | ICD-10-CM | POA: Diagnosis not present

## 2022-03-31 ENCOUNTER — Encounter (HOSPITAL_COMMUNITY): Payer: Self-pay | Admitting: Emergency Medicine

## 2022-03-31 ENCOUNTER — Emergency Department (HOSPITAL_COMMUNITY): Payer: Medicare Other

## 2022-03-31 ENCOUNTER — Emergency Department (HOSPITAL_COMMUNITY)
Admission: EM | Admit: 2022-03-31 | Discharge: 2022-03-31 | Disposition: A | Discharge status: Home / Self Care | Payer: Medicare Other | Attending: Emergency Medicine | Admitting: Emergency Medicine

## 2022-03-31 ENCOUNTER — Other Ambulatory Visit: Payer: Self-pay

## 2022-03-31 DIAGNOSIS — J9500 Unspecified tracheostomy complication: Secondary | ICD-10-CM | POA: Insufficient documentation

## 2022-03-31 DIAGNOSIS — R059 Cough, unspecified: Secondary | ICD-10-CM | POA: Diagnosis not present

## 2022-03-31 DIAGNOSIS — J9503 Malfunction of tracheostomy stoma: Secondary | ICD-10-CM | POA: Diagnosis not present

## 2022-03-31 DIAGNOSIS — T85698A Other mechanical complication of other specified internal prosthetic devices, implants and grafts, initial encounter: Secondary | ICD-10-CM | POA: Diagnosis not present

## 2022-03-31 DIAGNOSIS — R739 Hyperglycemia, unspecified: Secondary | ICD-10-CM | POA: Diagnosis not present

## 2022-03-31 DIAGNOSIS — R0602 Shortness of breath: Secondary | ICD-10-CM | POA: Diagnosis not present

## 2022-03-31 DIAGNOSIS — Z794 Long term (current) use of insulin: Secondary | ICD-10-CM | POA: Diagnosis not present

## 2022-03-31 DIAGNOSIS — I10 Essential (primary) hypertension: Secondary | ICD-10-CM | POA: Diagnosis not present

## 2022-03-31 DIAGNOSIS — J9509 Other tracheostomy complication: Secondary | ICD-10-CM | POA: Diagnosis not present

## 2022-03-31 NOTE — ED Triage Notes (Signed)
Pt arrives via EMS from home. Pt wants her Trach replaced. Pt has some dried blood around the site. No active bleeding. Pt noticed this a day ago. No other complaints. BP 180/110, HR 97, resp 18, 98% on 4L (this is what pt wears at home.) No respiratory complaints. ?

## 2022-03-31 NOTE — Discharge Instructions (Signed)
Your chest x-ray did not show an obvious pneumonia.  I discussed your case with pulmonology on-call, unfortunately I was unable to get in touch with Marni Griffon.  It does not look like he is in the hospital today.  Based on my description of your trach site they recommended follow-up in the clinic.  They would hold off on antibiotics at this time.  Please return if things worsen at home. ?

## 2022-03-31 NOTE — ED Notes (Signed)
Discharge instructions reviewed with patient. Patient verbalized understanding of instructions. Follow-up care and medications were reviewed. Patient ambulatory with steady gait. VSS upon discharge.  ?

## 2022-03-31 NOTE — ED Provider Notes (Signed)
?[facility] ?Provider Note ? ? ?CSN: [id number] ?Arrival date & time: 03/31/22  1448 ? ?  ? ?History ? ?Chief Complaint  ?Patient presents with  ? Tracheostomy Tube Change  ? ? ?Maureen Jones is a 56 y.o. female. ? ?55 yo F with a chief complaints of feeling she is having trouble breathing.  She is worried that she is having a problem with her tracheostomy and thinks that it may be infected.  She has been having some worsening issues breathing with it then speaking with it.  She saw pulmonology in the office recently and was referred to ENT for concern for possible subglottic stenosis.  She has developed a bit of a cough over the past couple days.  No fevers or chills. ? ? ? ?  ? ?Home Medications ?Prior to Admission medications   ?Medication Sig Start Date End Date Taking? Authorizing Provider  ?albuterol (PROVENTIL) (2.5 MG/3ML) 0.083% nebulizer solution Use 1 vial (2.5 mg total) by nebulization every 4 (four) hours as needed for wheezing or shortness of breath. 11/15/21  Yes Elgergawy, Silver Huguenin, MD  ?amLODipine (NORVASC) 10 MG tablet Take 1 tablet (10 mg total) by mouth daily. 02/17/22  Yes Barb Merino, MD  ?atorvastatin (LIPITOR) 20 MG tablet Take 1 tablet (20 mg total) by mouth daily. 02/17/22  Yes Barb Merino, MD  ?carvedilol (COREG) 25 MG tablet Take 1 tablet (25 mg total) by mouth 2 (two) times daily. 02/17/22  Yes Barb Merino, MD  ?clonazePAM (KLONOPIN) 0.5 MG tablet Take 1/2 tablet (0.25 mg total) by mouth daily at bedtime. 02/17/22  Yes Barb Merino, MD  ?DULoxetine (CYMBALTA) 60 MG capsule Take 1 capsule (60 mg total) by mouth daily. 02/17/22  Yes Barb Merino, MD  ?hydrALAZINE (APRESOLINE) 100 MG tablet Take 1 tablet (100 mg total) by mouth 3 (three) times daily. 02/17/22  Yes Barb Merino, MD  ?insulin lispro (HUMALOG) 100 UNIT/ML KwikPen Inject 16 Units into the skin 3 (three) times daily with meals. 02/17/22  Yes Barb Merino, MD  ?losartan (COZAAR) 50 MG  tablet Take 50 mg by mouth daily.   Yes [provider]  ?pantoprazole (PROTONIX) 40 MG tablet Take 1 tablet (40 mg total) by mouth 2 (two) times daily. ?Patient taking differently: Take 40 mg by mouth daily. 02/17/22  Yes Barb Merino, MD  ?promethazine (PHENERGAN) 25 MG tablet Take 1 tablet (25 mg total) by mouth every 6 (six) hours as needed for nausea or vomiting. 02/17/22  Yes Barb Merino, MD  ?TRESIBA FLEXTOUCH 200 UNIT/ML FlexTouch Pen Inject 32 Units into the skin 2 (two) times daily. 11/22/21  Yes Nche, Charlene Brooke, NP  ?budesonide (PULMICORT) 0.25 MG/2ML nebulizer solution Use 1 vial (0.25 mg total) by nebulization 2 (two) times daily. ?Patient not taking: Reported on 03/31/2022 11/15/21   Elgergawy, Silver Huguenin, MD  ?Insulin Pen Needle 32G X 4 MM MISC Use to inject insulin up to 4 times daily as directed 02/17/22   Barb Merino, MD  ?loperamide (IMODIUM) 2 MG capsule Take 1 capsule (2 mg total) by mouth every 4 (four) hours as needed for diarrhea or loose stools. 02/17/22   Barb Merino, MD  ?losartan (COZAAR) 50 MG tablet Take 1 tablet (50 mg total) by mouth daily. 02/17/22 03/19/22  Barb Merino, MD  ?insulin aspart (NOVOLOG) 100 UNIT/ML injection Insulin (Novolog) sliding scale for additional Novolog: administer 68minutes before meals. ?Sugar <150 - no units ?Sugar >151-200 - 4 units ?Sugar >200-250 - 6 units ?  Sugar >251-300 - 10 units and call office. ?Patient taking differently: Inject 4-10 Units into the skin See admin instructions. Insulin (Novolog) sliding scale for additional Novolog: administer 74minutes before meals. ?Sugar <150 - no units ?Sugar >151-200 - 4 units ?Sugar >200-250 - 6 units ?Sugar >251-300 - 10 units and call office. 11/22/21 12/17/21  Nche, Charlene Brooke, NP  ?   ? ?Allergies    ?Patient has no known allergies.   ? ?Review of Systems   ?Review of Systems ? ?Physical Exam ?Updated Vital Signs ?BP (!) 181/98   Pulse 98   Temp 98.5 ?F (36.9 ?C) (Oral)   Resp 14   SpO2 100%   ?Physical Exam ?Vitals and nursing note reviewed.  ?Constitutional:   ?   General: She is not in acute distress. ?   Appearance: She is well-developed. She is not diaphoretic.  ?HENT:  ?   Head: Normocephalic and atraumatic.  ?   Mouth/Throat:  ?   Comments: Some yellowish/brownish drainage around the tracheostomy site.  No obvious tenderness. ?Eyes:  ?   Pupils: Pupils are equal, round, and reactive to light.  ?Cardiovascular:  ?   Rate and Rhythm: Normal rate and regular rhythm.  ?   Heart sounds: No murmur heard. ?  No friction rub. No gallop.  ?Pulmonary:  ?   Effort: Pulmonary effort is normal.  ?   Breath sounds: No wheezing or rales.  ?Abdominal:  ?   General: There is no distension.  ?   Palpations: Abdomen is soft.  ?   Tenderness: There is no abdominal tenderness.  ?Musculoskeletal:     ?   General: No tenderness.  ?   Cervical back: Normal range of motion and neck supple.  ?Skin: ?   General: Skin is warm and dry.  ?Neurological:  ?   Mental Status: She is alert and oriented to person, place, and time.  ?Psychiatric:     ?   Behavior: Behavior normal.  ? ? ?ED Results / Procedures / Treatments   ?Labs ?(all labs ordered are listed, but only abnormal results are displayed) ?Labs Reviewed - No data to display ? ?EKG ?EKG Interpretation ? ?Date/Time:  Thursday March 31 2022 15:01:14 EDT ?Ventricular Rate:  102 ?PR Interval:  153 ?QRS Duration: 148 ?QT Interval:  386 ?QTC Calculation: 503 ?R Axis:   35 ?Text Interpretation: Sinus tachycardia Probable left atrial enlargement Right bundle branch block No significant change since last tracing Confirmed by Deno Etienne [phone]) on 03/31/2022 3:39:51 PM ? ?Radiology ?DG Chest Port 1 View ? ?Result Date: 03/31/2022 ?CLINICAL DATA:  Shortness of breath and cough. Requests tracheotomy replaced. EXAM: PORTABLE CHEST 1 VIEW COMPARISON:  AP chest 02/13/2022, 01/14/2022 FINDINGS: Cardiac silhouette again appears mildly enlarged. Mediastinal contours are within normal limits.  Tracheostomy tube overlies the midline trachea. Mild interval increase in bilateral mid and lower lung interstitial thickening. No pleural effusion or pneumothorax. No acute skeletal abnormality. IMPRESSION: Mild bilateral mid and lower lung interstitial thickening may reflect subsegmental atelectasis versus minimal interstitial pulmonary edema. Electronically Signed   By: Yvonne Kendall M.D.   On: 03/31/2022 16:01   ? ?Procedures ?Procedures  ? ? ?Medications Ordered in ED ?Medications - No data to display ? ?ED Course/ Medical Decision Making/ A&P ?  ?                        ?Medical Decision Making ?Amount and/or Complexity of Data Reviewed ?Radiology: ordered. ? ? ?  56 yo F with concern for something wrong with her tracheostomy site.  She tried to call the pulmonology clinic and left a message and became concerned when they did not call her back and so came to the ER for evaluation.  She would like me to try and call who she sees in the pulmonology clinic.  It does not appear that they are on call today.  We will see if I can send them a text page. ? ?Obtain a plain film to assess for pneumonia with cough and shortness of breath.  She does have some drainage about the tracheostomy site. ? ?Discussed case with Dr. Elsworth Soho.  Based on my description of the trach site felt reasonable to follow-up with the trach clinic.  Did not recommend antibiotics at this time based on my description. ? ?4:30 PM:  I have discussed the diagnosis/risks/treatment options with the patient.  Evaluation and diagnostic testing in the emergency department does not suggest an emergent condition requiring admission or immediate intervention beyond what has been performed at this time.  They will follow up with  [facility] clinic. We also discussed returning to the ED immediately if new or worsening sx occur. We discussed the sx which are most concerning (e.g., sudden worsening pain, fever, inability to tolerate by mouth) that necessitate immediate  return. Medications administered to the patient during their visit and any new prescriptions provided to the patient are listed below. ? ?Medications given during this visit ?Medications - No data to display

## 2022-04-01 ENCOUNTER — Ambulatory Visit (INDEPENDENT_AMBULATORY_CARE_PROVIDER_SITE_OTHER): Payer: Medicare Other | Admitting: Nurse Practitioner

## 2022-04-01 VITALS — BP 176/90 | HR 92 | Temp 97.4°F | Ht 66.0 in | Wt 245.0 lb

## 2022-04-01 DIAGNOSIS — F411 Generalized anxiety disorder: Secondary | ICD-10-CM | POA: Diagnosis not present

## 2022-04-01 DIAGNOSIS — E1165 Type 2 diabetes mellitus with hyperglycemia: Secondary | ICD-10-CM | POA: Diagnosis not present

## 2022-04-01 DIAGNOSIS — I160 Hypertensive urgency: Secondary | ICD-10-CM | POA: Diagnosis not present

## 2022-04-01 DIAGNOSIS — Z794 Long term (current) use of insulin: Secondary | ICD-10-CM | POA: Diagnosis not present

## 2022-04-01 DIAGNOSIS — N184 Chronic kidney disease, stage 4 (severe): Secondary | ICD-10-CM

## 2022-04-01 DIAGNOSIS — I10 Essential (primary) hypertension: Secondary | ICD-10-CM | POA: Diagnosis not present

## 2022-04-01 DIAGNOSIS — E1169 Type 2 diabetes mellitus with other specified complication: Secondary | ICD-10-CM

## 2022-04-01 DIAGNOSIS — E876 Hypokalemia: Secondary | ICD-10-CM

## 2022-04-01 DIAGNOSIS — R600 Localized edema: Secondary | ICD-10-CM

## 2022-04-01 LAB — BASIC METABOLIC PANEL
BUN: 29 mg/dL — ABNORMAL HIGH (ref 6–23)
CO2: 24 mEq/L (ref 19–32)
Calcium: 9.3 mg/dL (ref 8.4–10.5)
Chloride: 103 mEq/L (ref 96–112)
Creatinine, Ser: 3.19 mg/dL — ABNORMAL HIGH (ref 0.40–1.20)
GFR: 15.85 mL/min — ABNORMAL LOW (ref >60.00)
Glucose, Bld: 270 mg/dL — ABNORMAL HIGH (ref 70–99)
Potassium: 3.3 mEq/L — ABNORMAL LOW (ref 3.5–5.1)
Sodium: 136 mEq/L (ref 135–145)

## 2022-04-01 LAB — HEMOGLOBIN A1C: Hgb A1c MFr Bld: 8 % — ABNORMAL HIGH (ref 4.6–6.5)

## 2022-04-01 MED ORDER — LOSARTAN POTASSIUM 50 MG PO TABS: 50.0000 mg | ORAL_TABLET | Freq: Every day | ORAL | 3 refills | Status: DC

## 2022-04-01 MED ORDER — INSULIN PEN NEEDLE 32G X 4 MM MISC: 3 refills | Status: AC

## 2022-04-01 MED ORDER — AMLODIPINE BESYLATE 10 MG PO TABS
10.0000 mg | ORAL_TABLET | Freq: Every day | ORAL | 3 refills | Status: AC
Start: 2022-04-01 — End: ?

## 2022-04-01 MED ORDER — ATORVASTATIN CALCIUM 20 MG PO TABS: 20.0000 mg | ORAL_TABLET | Freq: Every day | ORAL | 3 refills | Status: AC

## 2022-04-01 MED ORDER — TRESIBA FLEXTOUCH 200 UNIT/ML ~~LOC~~ SOPN: 32.0000 [IU] | PEN_INJECTOR | Freq: Two times a day (BID) | SUBCUTANEOUS | 2 refills | Status: DC

## 2022-04-01 MED ORDER — HYDRALAZINE HCL 100 MG PO TABS: 100.0000 mg | ORAL_TABLET | Freq: Three times a day (TID) | ORAL | 3 refills | Status: AC

## 2022-04-01 MED ORDER — DULOXETINE HCL 60 MG PO CPEP: 60.0000 mg | ORAL_CAPSULE | Freq: Every day | ORAL | 3 refills | Status: DC

## 2022-04-01 MED ORDER — INSULIN LISPRO (1 UNIT DIAL) 100 UNIT/ML (KWIKPEN): 16.0000 [IU] | PEN_INJECTOR | Freq: Three times a day (TID) | SUBCUTANEOUS | 1 refills | Status: DC

## 2022-04-01 MED ORDER — ONETOUCH VERIO VI STRP
ORAL_STRIP | 12 refills | Status: AC
Start: 2022-04-01 — End: ?

## 2022-04-01 MED ORDER — PANTOPRAZOLE SODIUM 40 MG PO TBEC: 40.0000 mg | DELAYED_RELEASE_TABLET | Freq: Every day | ORAL | 1 refills | Status: DC

## 2022-04-01 MED ORDER — CARVEDILOL 25 MG PO TABS: 25.0000 mg | ORAL_TABLET | Freq: Two times a day (BID) | ORAL | 3 refills | Status: AC

## 2022-04-01 NOTE — Assessment & Plan Note (Addendum)
No glucose reading due to lack of glucose strips in last 1week. ?Current use of tresiba and humalog sliding scale ?Declined use of CGM. She states it did not work in the past. ? ?Check hgbA1c, CMP, and urine microalbumin: hgbA1c at 8%: uncontrolled DM. Maintain current insulin dose. Bring glucometer and reading to next appt ?Pending urine microalbumin ?F/up in 65month ?

## 2022-04-01 NOTE — Progress Notes (Signed)
? ?             Established Patient Visit ? ?Patient: Maureen Jones   DOB: May 04, 1966   56 y.o. Female  MRN: [id number] ?Visit Date: 04/04/2022 ? ?Subjective:  ?  ?Chief Complaint  ?Patient presents with  ? Acute Visit  ?  C/o having swelling in feet (turning black and also having HA ?S and knots in her finger/hands.    ? ?HPI ?Accompanied by friend/sister. ? ?DM (diabetes mellitus) ([place]) ?No glucose reading due to lack of glucose strips in last 1week. ?Current use of tresiba and humalog sliding scale ?Declined use of CGM. She states it did not work in the past. ? ?Check hgbA1c, CMP, and urine microalbumin: hgbA1c at 8%: uncontrolled DM. Maintain current insulin dose. Bring glucometer and reading to next appt ?Pending urine microalbumin ?F/up in 61month ? ?Essential hypertension ?BP not at goal due to lack of medications. ?BP Readings from Last 3 Encounters:  ?04/01/22 (!) 176/90  ?03/31/22 (!) 180/90  ?02/17/22 (!) 156/80  ? ?Med refills sent (hydralazine, amlodipine, coreg, and losartan) ?Added spirolactone due to LE edema and elevated BNP. ?Clear lung sounds ?Advised to maintain DASH diet and use of compression stockings ?Repeat BMP in 33month ? ?CKD (chronic kidney disease) stage 4, GFR 15-29 ml/min (HCC) ?Decline in renal function-stage 4 CKD and low potassium. Schedule appt with nephrology: Dr. Carolin Sicks. Sent 2doses of potassium ?Elevated BNP: fluid overload which leads to LE edema. Sent spironolactone to help with LE edema. Need to repeat labs in 85month. Maintain low sodium diet. ? ?Reviewed medical, surgical, and social history today ? ?Medications: ?Outpatient Medications Prior to Visit  ?Medication Sig  ? albuterol (PROVENTIL) (2.5 MG/3ML) 0.083% nebulizer solution Use 1 vial (2.5 mg total) by nebulization every 4 (four) hours as needed for wheezing or shortness of breath.  ? budesonide (PULMICORT) 0.25 MG/2ML nebulizer solution Use 1 vial (0.25 mg total) by nebulization 2 (two) times daily.  ? clonazePAM  (KLONOPIN) 0.5 MG tablet Take 1/2 tablet (0.25 mg total) by mouth daily at bedtime.  ? loperamide (IMODIUM) 2 MG capsule Take 1 capsule (2 mg total) by mouth every 4 (four) hours as needed for diarrhea or loose stools.  ? promethazine (PHENERGAN) 25 MG tablet Take 1 tablet (25 mg total) by mouth every 6 (six) hours as needed for nausea or vomiting.  ? [DISCONTINUED] amLODipine (NORVASC) 10 MG tablet Take 1 tablet (10 mg total) by mouth daily.  ? [DISCONTINUED] atorvastatin (LIPITOR) 20 MG tablet Take 1 tablet (20 mg total) by mouth daily.  ? [DISCONTINUED] carvedilol (COREG) 25 MG tablet Take 1 tablet (25 mg total) by mouth 2 (two) times daily.  ? [DISCONTINUED] DULoxetine (CYMBALTA) 60 MG capsule Take 1 capsule (60 mg total) by mouth daily.  ? [DISCONTINUED] hydrALAZINE (APRESOLINE) 100 MG tablet Take 1 tablet (100 mg total) by mouth 3 (three) times daily.  ? [DISCONTINUED] insulin lispro (HUMALOG) 100 UNIT/ML KwikPen Inject 16 Units into the skin 3 (three) times daily with meals.  ? [DISCONTINUED] Insulin Pen Needle 32G X 4 MM MISC Use to inject insulin up to 4 times daily as directed  ? [DISCONTINUED] losartan (COZAAR) 50 MG tablet Take 50 mg by mouth daily.  ? [DISCONTINUED] pantoprazole (PROTONIX) 40 MG tablet Take 1 tablet (40 mg total) by mouth 2 (two) times daily. (Patient taking differently: Take 40 mg by mouth daily.)  ? [DISCONTINUED] TRESIBA FLEXTOUCH 200 UNIT/ML FlexTouch Pen Inject 32 Units into the skin 2 (two) times  daily.  ? [DISCONTINUED] losartan (COZAAR) 50 MG tablet Take 1 tablet (50 mg total) by mouth daily.  ? ?No facility-administered medications prior to visit.  ? ?Reviewed past medical and social history.  ? ?ROS per HPI above ? ? ?   ?Objective:  ?BP (!) 176/90 (BP Location: Left Arm, Cuff Size: Large)   Pulse 92   Temp (!) 97.4 ?F (36.3 ?C) (Temporal)   Ht 5\' 6"  (1.676 m)   Wt 245 lb (111.1 kg)   SpO2 98% Comment: 4 litters O2  BMI 39.54 kg/m?  ? ?  ? ?Physical Exam ?Cardiovascular:   ?   Rate and Rhythm: Normal rate and regular rhythm.  ?   Pulses: Normal pulses.  ?   Heart sounds: Normal heart sounds.  ?Pulmonary:  ?   Effort: Pulmonary effort is normal.  ?   Breath sounds: Normal breath sounds.  ?Musculoskeletal:  ?   Right lower leg: Edema present.  ?   Left lower leg: Edema present.  ?Neurological:  ?   Mental Status: She is alert and oriented to person, place, and time.  ?Psychiatric:     ?   Mood and Affect: Mood normal.     ?   Behavior: Behavior normal.     ?   Thought Content: Thought content normal.  ?  ?Results for orders placed or performed in visit on 04/01/22  ?Basic metabolic panel  ?Result Value Ref Range  ? Sodium 136 135 - 145 mEq/L  ? Potassium 3.3 (L) 3.5 - 5.1 mEq/L  ? Chloride 103 96 - 112 mEq/L  ? CO2 24 19 - 32 mEq/L  ? Glucose, Bld 270 (H) 70 - 99 mg/dL  ? BUN 29 (H) 6 - 23 mg/dL  ? Creatinine, Ser 3.19 (H) 0.40 - 1.20 mg/dL  ? GFR 15.85 (L) >60.00 mL/min  ? Calcium 9.3 8.4 - 10.5 mg/dL  ?Hemoglobin A1c  ?Result Value Ref Range  ? Hgb A1c MFr Bld 8.0 (H) 4.6 - 6.5 %  ?Pro b natriuretic peptide  ?Result Value Ref Range  ? NT-Pro BNP 306 (H) 0 - 249 pg/mL  ? ?   ?Assessment & Plan:  ?  ?Problem List Items Addressed This Visit   ? ?  ? Cardiovascular and Mediastinum  ? Essential hypertension  ?  BP not at goal due to lack of medications. ?BP Readings from Last 3 Encounters:  ?04/01/22 (!) 176/90  ?03/31/22 (!) 180/90  ?02/17/22 (!) 156/80  ? ?Med refills sent (hydralazine, amlodipine, coreg, and losartan) ?Added spirolactone due to LE edema and elevated BNP. ?Clear lung sounds ?Advised to maintain DASH diet and use of compression stockings ?Repeat BMP in 93month ?  ?  ? Relevant Medications  ? amLODipine (NORVASC) 10 MG tablet  ? atorvastatin (LIPITOR) 20 MG tablet  ? carvedilol (COREG) 25 MG tablet  ? hydrALAZINE (APRESOLINE) 100 MG tablet  ? losartan (COZAAR) 50 MG tablet  ? spironolactone (ALDACTONE) 25 MG tablet  ? Hypertensive urgency - Primary  ? Relevant Medications   ? amLODipine (NORVASC) 10 MG tablet  ? atorvastatin (LIPITOR) 20 MG tablet  ? carvedilol (COREG) 25 MG tablet  ? hydrALAZINE (APRESOLINE) 100 MG tablet  ? losartan (COZAAR) 50 MG tablet  ? spironolactone (ALDACTONE) 25 MG tablet  ?  ? Endocrine  ? DM (diabetes mellitus) ([place])  ?  No glucose reading due to lack of glucose strips in last 1week. ?Current use of tresiba and humalog sliding scale ?Declined use  of CGM. She states it did not work in the past. ? ?Check hgbA1c, CMP, and urine microalbumin: hgbA1c at 8%: uncontrolled DM. Maintain current insulin dose. Bring glucometer and reading to next appt ?Pending urine microalbumin ?F/up in 51month ? ?  ?  ? Relevant Medications  ? glucose blood (ONETOUCH VERIO) test strip  ? atorvastatin (LIPITOR) 20 MG tablet  ? insulin lispro (HUMALOG) 100 UNIT/ML KwikPen  ? losartan (COZAAR) 50 MG tablet  ? TRESIBA FLEXTOUCH 200 UNIT/ML FlexTouch Pen  ? Other Relevant Orders  ? Basic metabolic panel (Completed)  ? Hemoglobin A1c (Completed)  ? Microalbumin / creatinine urine ratio  ?  ? Genitourinary  ? CKD (chronic kidney disease) stage 4, GFR 15-29 ml/min (HCC)  ?  Decline in renal function-stage 4 CKD and low potassium. Schedule appt with nephrology: Dr. Carolin Sicks. Sent 2doses of potassium ?Elevated BNP: fluid overload which leads to LE edema. Sent spironolactone to help with LE edema. Need to repeat labs in 42month. Maintain low sodium diet. ? ?  ?  ? ?Other Visit Diagnoses   ? ? GAD (generalized anxiety disorder)      ? Relevant Medications  ? DULoxetine (CYMBALTA) 60 MG capsule  ? Bilateral leg edema      ? Relevant Orders  ? Pro b natriuretic peptide (Completed)  ? Hypokalemia      ? Relevant Medications  ? potassium chloride SA (KLOR-CON M) 20 MEQ tablet  ? ?  ? ?Return in about 1 month (around 05/01/2022) for HTN (repeat BMP and BNP). ? ?  ? ?Wilfred Lacy, NP ? ? ?

## 2022-04-01 NOTE — Patient Instructions (Addendum)
Go to lab ?Bring glucose readings to next appt. ?Use compression stocking during the day and off at night. ?Maintain DASH diet. ? ?DASH Eating Plan ?DASH stands for Dietary Approaches to Stop Hypertension. The DASH eating plan is a healthy eating plan that has been shown to: ?Reduce high blood pressure (hypertension). ?Reduce your risk for type 2 diabetes, heart disease, and stroke. ?Help with weight loss. ?What are tips for following this plan? ?Reading food labels ?Check food labels for the amount of salt (sodium) per serving. Choose foods with less than 5 percent of the Daily Value of sodium. Generally, foods with less than 300 milligrams (mg) of sodium per serving fit into this eating plan. ?To find whole grains, look for the word "whole" as the first word in the ingredient list. ?Shopping ?Buy products labeled as "low-sodium" or "no salt added." ?Buy fresh foods. Avoid canned foods and pre-made or frozen meals. ?Cooking ?Avoid adding salt when cooking. Use salt-free seasonings or herbs instead of table salt or sea salt. Check with your health care provider or pharmacist before using salt substitutes. ?Do not fry foods. Cook foods using healthy methods such as baking, boiling, grilling, roasting, and broiling instead. ?Cook with heart-healthy oils, such as olive, canola, avocado, soybean, or sunflower oil. ?Meal planning ? ?Eat a balanced diet that includes: ?4 or more servings of fruits and 4 or more servings of vegetables each day. Try to fill one-half of your plate with fruits and vegetables. ?6-8 servings of whole grains each day. ?Less than 6 oz (170 g) of lean meat, poultry, or fish each day. A 3-oz (85-g) serving of meat is about the same size as a deck of cards. One egg equals 1 oz (28 g). ?2-3 servings of low-fat dairy each day. One serving is 1 cup (237 mL). ?1 serving of nuts, seeds, or beans 5 times each week. ?2-3 servings of heart-healthy fats. Healthy fats called omega-3 fatty acids are found in  foods such as walnuts, flaxseeds, fortified milks, and eggs. These fats are also found in cold-water fish, such as sardines, salmon, and mackerel. ?Limit how much you eat of: ?Canned or prepackaged foods. ?Food that is high in trans fat, such as some fried foods. ?Food that is high in saturated fat, such as fatty meat. ?Desserts and other sweets, sugary drinks, and other foods with added sugar. ?Full-fat dairy products. ?Do not salt foods before eating. ?Do not eat more than 4 egg yolks a week. ?Try to eat at least 2 vegetarian meals a week. ?Eat more home-cooked food and less restaurant, buffet, and fast food. ?Lifestyle ?When eating at a restaurant, ask that your food be prepared with less salt or no salt, if possible. ?If you drink alcohol: ?Limit how much you use to: ?0-1 drink a day for women who are not pregnant. ?0-2 drinks a day for men. ?Be aware of how much alcohol is in your drink. In the [place] one drink equals one 12 oz bottle of beer (355 mL), one 5 oz glass of wine (148 mL), or one 1? oz glass of hard liquor (44 mL). ?General information ?Avoid eating more than 2,300 mg of salt a day. If you have hypertension, you may need to reduce your sodium intake to 1,500 mg a day. ?Work with your health care provider to maintain a healthy body weight or to lose weight. Ask what an ideal weight is for you. ?Get at least 30 minutes of exercise that causes your heart to beat faster (  aerobic exercise) most days of the week. Activities may include walking, swimming, or biking. ?Work with your health care provider or dietitian to adjust your eating plan to your individual calorie needs. ?What foods should I eat? ?Fruits ?All fresh, dried, or frozen fruit. Canned fruit in natural juice (without added sugar). ?Vegetables ?Fresh or frozen vegetables (raw, steamed, roasted, or grilled). Low-sodium or reduced-sodium tomato and vegetable juice. Low-sodium or reduced-sodium tomato sauce and tomato paste. Low-sodium or  reduced-sodium canned vegetables. ?Grains ?Whole-grain or whole-wheat bread. Whole-grain or whole-wheat pasta. Brown rice. Modena Morrow. Bulgur. Whole-grain and low-sodium cereals. Pita bread. Low-fat, low-sodium crackers. Whole-wheat flour tortillas. ?Meats and other proteins ?Skinless chicken or [place]. Ground chicken or [place]. Pork with fat trimmed off. Fish and seafood. Egg whites. Dried beans, peas, or lentils. Unsalted nuts, nut butters, and seeds. Unsalted canned beans. Lean cuts of beef with fat trimmed off. Low-sodium, lean precooked or cured meat, such as sausages or meat loaves. ?Dairy ?Low-fat (1%) or fat-free (skim) milk. Reduced-fat, low-fat, or fat-free cheeses. Nonfat, low-sodium ricotta or cottage cheese. Low-fat or nonfat yogurt. Low-fat, low-sodium cheese. ?Fats and oils ?Soft margarine without trans fats. Vegetable oil. Reduced-fat, low-fat, or light mayonnaise and salad dressings (reduced-sodium). Canola, safflower, olive, avocado, soybean, and sunflower oils. Avocado. ?Seasonings and condiments ?Herbs. Spices. Seasoning mixes without salt. ?Other foods ?Unsalted popcorn and pretzels. Fat-free sweets. ?The items listed above may not be a complete list of foods and beverages you can eat. Contact a dietitian for more information. ?What foods should I avoid? ?Fruits ?Canned fruit in a light or heavy syrup. Fried fruit. Fruit in cream or butter sauce. ?Vegetables ?Creamed or fried vegetables. Vegetables in a cheese sauce. Regular canned vegetables (not low-sodium or reduced-sodium). Regular canned tomato sauce and paste (not low-sodium or reduced-sodium). Regular tomato and vegetable juice (not low-sodium or reduced-sodium). Angie Fava. Olives. ?Grains ?Baked goods made with fat, such as croissants, muffins, or some breads. Dry pasta or rice meal packs. ?Meats and other proteins ?Fatty cuts of meat. Ribs. Fried meat. Berniece Salines. Bologna, salami, and other precooked or cured meats, such as sausages or  meat loaves. Fat from the back of a pig (fatback). Bratwurst. Salted nuts and seeds. Canned beans with added salt. Canned or smoked fish. Whole eggs or egg yolks. Chicken or [place] with skin. ?Dairy ?Whole or 2% milk, cream, and half-and-half. Whole or full-fat cream cheese. Whole-fat or sweetened yogurt. Full-fat cheese. Nondairy creamers. Whipped toppings. Processed cheese and cheese spreads. ?Fats and oils ?Butter. Stick margarine. Lard. Shortening. Ghee. Bacon fat. Tropical oils, such as coconut, palm kernel, or palm oil. ?Seasonings and condiments ?Onion salt, garlic salt, seasoned salt, table salt, and sea salt. Worcestershire sauce. Tartar sauce. Barbecue sauce. Teriyaki sauce. Soy sauce, including reduced-sodium. Steak sauce. Canned and packaged gravies. Fish sauce. Oyster sauce. Cocktail sauce. Store-bought horseradish. Ketchup. Mustard. Meat flavorings and tenderizers. Bouillon cubes. Hot sauces. Pre-made or packaged marinades. Pre-made or packaged taco seasonings. Relishes. Regular salad dressings. ?Other foods ?Salted popcorn and pretzels. ?The items listed above may not be a complete list of foods and beverages you should avoid. Contact a dietitian for more information. ?Where to find more information ?National Heart, Lung, and Blood Institute: https://wilson-eaton.com/ ?American Heart Association: www.heart.org ?Academy of Nutrition and Dietetics: www.eatright.org ?[place]: www.kidney.org ?Summary ?The DASH eating plan is a healthy eating plan that has been shown to reduce high blood pressure (hypertension). It may also reduce your risk for type 2 diabetes, heart disease, and stroke. ?When on the  DASH eating plan, aim to eat more fresh fruits and vegetables, whole grains, lean proteins, low-fat dairy, and heart-healthy fats. ?With the DASH eating plan, you should limit salt (sodium) intake to 2,300 mg a day. If you have hypertension, you may need to reduce your sodium intake to 1,500 mg a  day. ?Work with your health care provider or dietitian to adjust your eating plan to your individual calorie needs. ?This information is not intended to replace advice given to you by your health care provider. M

## 2022-04-01 NOTE — Assessment & Plan Note (Addendum)
BP not at goal due to lack of medications. ?BP Readings from Last 3 Encounters:  ?04/01/22 (!) 176/90  ?03/31/22 (!) 180/90  ?02/17/22 (!) 156/80  ? ?Med refills sent (hydralazine, amlodipine, coreg, and losartan) ?Added spirolactone due to LE edema and elevated BNP. ?Clear lung sounds ?Advised to maintain DASH diet and use of compression stockings ?Repeat BMP in 41month ?

## 2022-04-02 LAB — PRO B NATRIURETIC PEPTIDE: NT-Pro BNP: 306 pg/mL — ABNORMAL HIGH (ref 0–249)

## 2022-04-03 MED ORDER — POTASSIUM CHLORIDE CRYS ER 20 MEQ PO TBCR: 40.0000 meq | EXTENDED_RELEASE_TABLET | Freq: Every day | ORAL | 0 refills | Status: DC

## 2022-04-03 MED ORDER — SPIRONOLACTONE 25 MG PO TABS: 25.0000 mg | ORAL_TABLET | Freq: Every day | ORAL | 5 refills | Status: DC

## 2022-04-04 ENCOUNTER — Encounter: Payer: Self-pay | Admitting: Nurse Practitioner

## 2022-04-04 NOTE — Assessment & Plan Note (Signed)
Decline in renal function-stage 4 CKD and low potassium. Schedule appt with nephrology: Dr. Carolin Sicks. Sent 2doses of potassium ?Elevated BNP: fluid overload which leads to LE edema. Sent spironolactone to help with LE edema. Need to repeat labs in 24month. Maintain low sodium diet. ?

## 2022-04-05 ENCOUNTER — Other Ambulatory Visit (HOSPITAL_COMMUNITY): Payer: Self-pay

## 2022-04-05 ENCOUNTER — Encounter: Payer: Self-pay | Admitting: Nurse Practitioner

## 2022-04-05 ENCOUNTER — Other Ambulatory Visit: Payer: Self-pay | Admitting: Nurse Practitioner

## 2022-04-05 DIAGNOSIS — J4520 Mild intermittent asthma, uncomplicated: Secondary | ICD-10-CM

## 2022-04-05 MED ORDER — BUDESONIDE 0.25 MG/2ML IN SUSP
0.2500 mg | Freq: Two times a day (BID) | RESPIRATORY_TRACT | 2 refills | Status: DC
  Filled 2022-04-05: qty 120, 30d supply, fill #0

## 2022-04-05 MED ORDER — BUDESONIDE 0.25 MG/2ML IN SUSP: 0.2500 mg | Freq: Two times a day (BID) | RESPIRATORY_TRACT | 2 refills | Status: DC

## 2022-04-05 MED ORDER — ALBUTEROL SULFATE (2.5 MG/3ML) 0.083% IN NEBU
2.5000 mg | INHALATION_SOLUTION | Freq: Four times a day (QID) | RESPIRATORY_TRACT | 0 refills | Status: DC | PRN
  Filled 2022-04-05: qty 90, 8d supply, fill #0

## 2022-04-05 MED ORDER — ALBUTEROL SULFATE (2.5 MG/3ML) 0.083% IN NEBU: 2.5000 mg | INHALATION_SOLUTION | Freq: Four times a day (QID) | RESPIRATORY_TRACT | 0 refills | Status: DC | PRN

## 2022-04-06 ENCOUNTER — Other Ambulatory Visit: Payer: Self-pay | Admitting: Nurse Practitioner

## 2022-04-06 DIAGNOSIS — J4520 Mild intermittent asthma, uncomplicated: Secondary | ICD-10-CM

## 2022-04-08 ENCOUNTER — Telehealth: Payer: Self-pay | Admitting: Acute Care

## 2022-04-08 DIAGNOSIS — Z930 Tracheostomy status: Secondary | ICD-10-CM

## 2022-04-08 NOTE — Telephone Encounter (Signed)
Per pharmacy mediation is on back order and unavailable at this time. ?

## 2022-04-11 ENCOUNTER — Encounter: Payer: Self-pay | Admitting: Nurse Practitioner

## 2022-04-11 DIAGNOSIS — J4520 Mild intermittent asthma, uncomplicated: Secondary | ICD-10-CM

## 2022-04-11 NOTE — Telephone Encounter (Signed)
Maureen Jones I do not see a order for ENT I did find 2 orders for home health and DME that never hit our box due to it going under critical care should have been under pulmonary ?

## 2022-04-12 ENCOUNTER — Other Ambulatory Visit (HOSPITAL_COMMUNITY): Payer: Self-pay

## 2022-04-12 MED ORDER — LEVOCETIRIZINE DIHYDROCHLORIDE 5 MG PO TABS
5.0000 mg | ORAL_TABLET | Freq: Every evening | ORAL | 3 refills | Status: DC
  Filled 2022-04-12: qty 90, 90d supply, fill #0

## 2022-04-12 MED ORDER — LEVOCETIRIZINE DIHYDROCHLORIDE 5 MG PO TABS: 5.0000 mg | ORAL_TABLET | Freq: Every evening | ORAL | 3 refills | Status: DC

## 2022-04-12 MED ORDER — FLUTICASONE PROPIONATE 50 MCG/ACT NA SUSP
2.0000 | Freq: Every day | NASAL | 5 refills | Status: DC
  Filled 2022-04-12: qty 16, fill #0

## 2022-04-12 MED ORDER — FLUTICASONE PROPIONATE 50 MCG/ACT NA SUSP: 2.0000 | Freq: Every day | NASAL | 5 refills | Status: AC

## 2022-04-12 NOTE — Telephone Encounter (Signed)
Urgent ENT referral placed and secure chat to [facility] to have him sign this.  ?

## 2022-04-12 NOTE — Addendum Note (Signed)
Addended by: Wilfred Lacy L on: 04/12/2022 12:59 PM ? ? Modules accepted: Orders ? ?

## 2022-04-13 ENCOUNTER — Other Ambulatory Visit: Payer: Self-pay | Admitting: Nurse Practitioner

## 2022-04-13 DIAGNOSIS — J4520 Mild intermittent asthma, uncomplicated: Secondary | ICD-10-CM

## 2022-04-15 ENCOUNTER — Ambulatory Visit (HOSPITAL_COMMUNITY)
Admission: RE | Admit: 2022-04-15 | Discharge: 2022-04-15 | Disposition: A | Discharge status: Home / Self Care | Payer: Medicare Other | Source: Ambulatory Visit | Attending: Acute Care | Admitting: Acute Care

## 2022-04-15 DIAGNOSIS — Z930 Tracheostomy status: Secondary | ICD-10-CM | POA: Diagnosis not present

## 2022-04-15 DIAGNOSIS — J45909 Unspecified asthma, uncomplicated: Secondary | ICD-10-CM | POA: Diagnosis not present

## 2022-04-15 DIAGNOSIS — Z430 Encounter for attention to tracheostomy: Secondary | ICD-10-CM | POA: Diagnosis not present

## 2022-04-15 DIAGNOSIS — J383 Other diseases of vocal cords: Secondary | ICD-10-CM | POA: Insufficient documentation

## 2022-04-15 DIAGNOSIS — J9600 Acute respiratory failure, unspecified whether with hypoxia or hypercapnia: Secondary | ICD-10-CM | POA: Diagnosis not present

## 2022-04-15 DIAGNOSIS — I10 Essential (primary) hypertension: Secondary | ICD-10-CM | POA: Diagnosis not present

## 2022-04-15 NOTE — Progress Notes (Signed)
Reason for visit ?Tracheostomy change ? 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  Has been in and out of the hospital since tracheostomy placed secondary to intermittent tracheal plugging, dislodgment, as well as her other multiple medical comorbidities such as hypertension, most recently hypertensive crisis.  She presents today for tracheostomy change.   ?Since her last visit about 1 month ago she has since again been in the emergency room for tracheostomy discomfort.  Also having some bloody tracheostomy output.  Because she is seen in the emergency room I asked her to come into the clinic so I could reevaluate her tracheostomy. ? ?Review of systems ?Review of Systems  ?Constitutional:  Positive for fever. Negative for chills.  ?HENT:  Positive for congestion.   ?Eyes: Negative.   ?Respiratory:  Positive for cough, sputum production and shortness of breath.   ?Cardiovascular:  Positive for chest pain.  ?Gastrointestinal: Negative.   ?Genitourinary: Negative.   ?Musculoskeletal: Negative.   ?Skin: Negative.   ? ? ?Physical exam ?General this a pleasant 56 year old female who is ambulatory in walked into the tracheostomy clinic ?HEENT normocephalic atraumatic has size 6 proximal XLT tracheostomy initially with poor phonation however this improved following tracheostomy change still some evidence of subglottic stenosis. ?Pulmonary: Clear to auscultation ?Cardiac: Regular rate and rhythm ?Abdomen soft nontender ?Neuro intact ? ? ?Procedure ?The size 6 proximal XLT was removed, the patient stoma was evaluated this was unremarkable.  A new size 6 proximal XLT was placed without difficulty, placement was verified via end-tidal CO2. ?Patient tolerated well ? ?Procedure #2 walking oximetry: ?Patient was removed from supplemental oxygen.  Her resting pulse oximetry was noted at 96 to 97% on room  air.  We walked approximately 150 feet off from oxygen.  Her lowest pulse oximetry was 90% however she did exhibit some exertional dyspnea with this.  Of note she did have her Passy-Muir valve in place during this.  I instructed her to remove the Passy-Muir valve, after resting she ambulated the same distance.  She had no desaturation with pulse oximetry staying in the 95 range and her exertional dyspnea was much less ? ?Impression/plan ? 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  Her breathing is improved after most recent tracheostomy change.  I wonder if to some extent the previous one may have been lodged against the posterior wall at any rate her voice quality is much improved.  I still think she has evidence of subglottic stenosis and would benefit from ENT evaluation ? ?Plan ?Continue routine trach care  ?ENT consultation requested  ?Supplemental oxygen as needed, we did walking oximetry she had no noted desaturation I still think she needs humidified oxygen at night I still think she will benefit from it as needed we will not remove oxygen at this point  ?continue routine tracheostomy care ?I will see her again in 8 weeks ? ?My time 32 min  ?Erick Colace ACNP-BC ?[place] ?Pager # [phone] OR # [phone] if no answer ? ?

## 2022-04-15 NOTE — Progress Notes (Signed)
Tracheostomy Procedure Note ? ?[name] ?[id number] ?02/02/1966 ? ?Pre Procedure Tracheostomy Information ? ?Trach BrandCristy Hilts ?Size:  60 XLTUP ?Style: Uncuffed ?Secured by: Velcro ? ? ?Procedure: Trach change and Trach cleaning ? ? ? ?Post Procedure Tracheostomy Information ? ?Trach BrandCristy Hilts ?Size:  60 XLTUP ?Style: Uncuffed ?Secured by: Velcro ? ? ?Post Procedure Evaluation: ? ?ETCO2 positive color change from yellow to purple : Yes.   ?Vital signs:VSS ?Patients current condition: stable ?Complications: No apparent complications ?Trach site exam: clean, dry ?Wound care done: 4 x 4 gauze drain ?Patient did tolerate procedure well. ? ? ?Education ?Waking with and with out PMV  proper way and times to wear HMO ? ?Prescription needs: ?none ? ? ? ?Additional needs: ?New PMV , trach collar , wound bandages and extra inner cannulas and 5  trach HMEs ? ? ? ? ? ? ?  ?

## 2022-04-18 ENCOUNTER — Encounter: Payer: Self-pay | Admitting: Nurse Practitioner

## 2022-04-19 NOTE — Telephone Encounter (Signed)
Pharmacy states no [facility] locally has Rx because it is backordered. The only one they have available is the 0.63 mg/mL and the 1.25 mg/93mL.  ?

## 2022-04-27 DIAGNOSIS — J383 Other diseases of vocal cords: Secondary | ICD-10-CM | POA: Diagnosis not present

## 2022-04-27 DIAGNOSIS — J45909 Unspecified asthma, uncomplicated: Secondary | ICD-10-CM | POA: Diagnosis not present

## 2022-04-28 ENCOUNTER — Telehealth: Payer: Self-pay | Admitting: *Deleted

## 2022-04-28 NOTE — Telephone Encounter (Signed)
I saw this note Laurey Arrow had put in the order but it went under critical care so it never hit our [place] but I have corrected it to fall in our WQ

## 2022-04-28 NOTE — Telephone Encounter (Signed)
-----   Message from Erick Colace, NP sent at 04/28/2022  2:05 PM EDT ----- Regarding: FW: ENT eval  ----- Message ----- From: Erick Colace, NP Sent: 04/28/2022   2:03 PM EDT To: Harland German Subject: ENT eval                                       Can you guys please follow up on the ENT eval request.  She has not been contacted by ENT yet and it's been over two weeks since we replaced the order   Let me know what you find out [facility]

## 2022-04-28 NOTE — Telephone Encounter (Signed)
Forwarding to [facility] to check on the urgent ENT referral that was placed. Please advise and send response to [place]. Thanks!

## 2022-05-03 ENCOUNTER — Ambulatory Visit: Payer: Medicare Other | Admitting: Nurse Practitioner

## 2022-05-06 ENCOUNTER — Telehealth: Payer: Self-pay | Admitting: Nurse Practitioner

## 2022-05-06 ENCOUNTER — Ambulatory Visit: Payer: Medicare Other | Admitting: Nurse Practitioner

## 2022-05-06 NOTE — Telephone Encounter (Signed)
Pt was a no show on 05/06/22 for an OV with Baldo Ash, I sent a no show letter.Marland Kitchen

## 2022-05-12 DIAGNOSIS — Z930 Tracheostomy status: Secondary | ICD-10-CM | POA: Diagnosis not present

## 2022-05-12 NOTE — Telephone Encounter (Signed)
msg from pt in [place] stating she cancelled via automated reminder call 2 days prior to visit - removed no show KO 6/1

## 2022-05-16 DIAGNOSIS — J45909 Unspecified asthma, uncomplicated: Secondary | ICD-10-CM | POA: Diagnosis not present

## 2022-05-16 DIAGNOSIS — J9600 Acute respiratory failure, unspecified whether with hypoxia or hypercapnia: Secondary | ICD-10-CM | POA: Diagnosis not present

## 2022-05-16 DIAGNOSIS — Z930 Tracheostomy status: Secondary | ICD-10-CM | POA: Diagnosis not present

## 2022-05-22 ENCOUNTER — Emergency Department (HOSPITAL_COMMUNITY)
Admission: EM | Admit: 2022-05-22 | Discharge: 2022-05-23 | Disposition: A | Discharge status: Home / Self Care | Payer: Medicare Other | Attending: Emergency Medicine | Admitting: Emergency Medicine

## 2022-05-22 ENCOUNTER — Emergency Department (HOSPITAL_COMMUNITY): Payer: Medicare Other

## 2022-05-22 ENCOUNTER — Other Ambulatory Visit: Payer: Self-pay

## 2022-05-22 DIAGNOSIS — R0602 Shortness of breath: Secondary | ICD-10-CM | POA: Insufficient documentation

## 2022-05-22 DIAGNOSIS — R0689 Other abnormalities of breathing: Secondary | ICD-10-CM | POA: Diagnosis not present

## 2022-05-22 DIAGNOSIS — R52 Pain, unspecified: Secondary | ICD-10-CM | POA: Diagnosis not present

## 2022-05-22 DIAGNOSIS — Z794 Long term (current) use of insulin: Secondary | ICD-10-CM | POA: Diagnosis not present

## 2022-05-22 DIAGNOSIS — Z430 Encounter for attention to tracheostomy: Secondary | ICD-10-CM | POA: Diagnosis not present

## 2022-05-22 DIAGNOSIS — I10 Essential (primary) hypertension: Secondary | ICD-10-CM | POA: Diagnosis not present

## 2022-05-22 DIAGNOSIS — Z79899 Other long term (current) drug therapy: Secondary | ICD-10-CM | POA: Diagnosis not present

## 2022-05-22 DIAGNOSIS — I469 Cardiac arrest, cause unspecified: Secondary | ICD-10-CM | POA: Diagnosis not present

## 2022-05-22 DIAGNOSIS — D72829 Elevated white blood cell count, unspecified: Secondary | ICD-10-CM | POA: Insufficient documentation

## 2022-05-22 DIAGNOSIS — E119 Type 2 diabetes mellitus without complications: Secondary | ICD-10-CM | POA: Diagnosis not present

## 2022-05-22 DIAGNOSIS — D649 Anemia, unspecified: Secondary | ICD-10-CM | POA: Insufficient documentation

## 2022-05-22 DIAGNOSIS — R600 Localized edema: Secondary | ICD-10-CM | POA: Diagnosis not present

## 2022-05-22 DIAGNOSIS — R7989 Other specified abnormal findings of blood chemistry: Secondary | ICD-10-CM | POA: Diagnosis not present

## 2022-05-22 LAB — TROPONIN I (HIGH SENSITIVITY): Troponin I (High Sensitivity): 15 ng/L (ref <18)

## 2022-05-22 LAB — BRAIN NATRIURETIC PEPTIDE: B Natriuretic Peptide: 63.9 pg/mL (ref 0.0–100.0)

## 2022-05-22 LAB — BASIC METABOLIC PANEL
Anion gap: 11 (ref 5–15)
BUN: 34 mg/dL — ABNORMAL HIGH (ref 6–20)
CO2: 21 mmol/L — ABNORMAL LOW (ref 22–32)
Calcium: 9 mg/dL (ref 8.9–10.3)
Chloride: 109 mmol/L (ref 98–111)
Creatinine, Ser: 3.99 mg/dL — ABNORMAL HIGH (ref 0.44–1.00)
GFR, Estimated: 13 mL/min — ABNORMAL LOW (ref >60)
Glucose, Bld: 148 mg/dL — ABNORMAL HIGH (ref 70–99)
Potassium: 4.1 mmol/L (ref 3.5–5.1)
Sodium: 141 mmol/L (ref 135–145)

## 2022-05-22 LAB — CBC WITH DIFFERENTIAL/PLATELET
Abs Immature Granulocytes: 0.03 10*3/uL (ref 0.00–0.07)
Basophils Absolute: 0 10*3/uL (ref 0.0–0.1)
Basophils Relative: 0 %
Eosinophils Absolute: 0.2 10*3/uL (ref 0.0–0.5)
Eosinophils Relative: 2 %
HCT: 29.7 % — ABNORMAL LOW (ref 36.0–46.0)
Hemoglobin: 9.2 g/dL — ABNORMAL LOW (ref 12.0–15.0)
Immature Granulocytes: 0 %
Lymphocytes Relative: 22 %
Lymphs Abs: 2.4 10*3/uL (ref 0.7–4.0)
MCH: 28.7 pg (ref 26.0–34.0)
MCHC: 31 g/dL (ref 30.0–36.0)
MCV: 92.5 fL (ref 80.0–100.0)
Monocytes Absolute: 0.7 10*3/uL (ref 0.1–1.0)
Monocytes Relative: 7 %
Neutro Abs: 7.4 10*3/uL (ref 1.7–7.7)
Neutrophils Relative %: 69 %
Platelets: 232 10*3/uL (ref 150–400)
RBC: 3.21 MIL/uL — ABNORMAL LOW (ref 3.87–5.11)
RDW: 13.3 % (ref 11.5–15.5)
WBC: 10.8 10*3/uL — ABNORMAL HIGH (ref 4.0–10.5)
nRBC: 0 % (ref 0.0–0.2)

## 2022-05-22 MED ORDER — HYDRALAZINE HCL 20 MG/ML IJ SOLN
5.0000 mg | Freq: Once | INTRAMUSCULAR | Status: AC
  Administered 2022-05-22: 5 mg via INTRAVENOUS
  Filled 2022-05-22: qty 1

## 2022-05-22 NOTE — Discharge Instructions (Addendum)
I am glad that you are breathing improved after suctioning of your trach we were able to remove a large mucous plug.  Please make sure you are taking your blood pressure medications daily as directed continue to monitor your blood pressure closely and follow-up with your primary care doctor.  Please follow-up with your primary care doctor for leg swelling as well, if it persists your PCP may recommend following up with cardiology, today your cardiac work-up is reassuring.

## 2022-05-22 NOTE — ED Notes (Signed)
Patient reports she feels better after RT was able to suction and remove a mucus plug

## 2022-05-22 NOTE — ED Notes (Signed)
Patient ambulatory to restroom.  No complaints voiced at this time.  Steady gait noted

## 2022-05-22 NOTE — ED Triage Notes (Signed)
Patient BIB EMS for evaluation of difficulty breathing.  Has trach in place and states that symptoms started yesterday.  Feels "like something is stuck and I can't get it up."  No reports of pain

## 2022-05-22 NOTE — ED Provider Notes (Incomplete)
[facility] Provider Note   CSN: [id number] Arrival date & time: 05/22/22  1938     History {Add pertinent medical, surgical, social history, OB history to HPI:1} Chief Complaint  Patient presents with  . Shortness of Breath    Maureen Jones is a 56 y.o. female.  Maureen Jones is a 56 y.o. female with a history of hypertension, diabetes and subglottic edema s/p tracheostomy, who presents to the emergency department for increased difficulty breathing.  She reports it felt like something was stuck in her airway or trach.  She tried changing the inner cannula at home prior to arrival without improvement.  The history is provided by the patient and medical records.  Shortness of Breath      Home Medications Prior to Admission medications   Medication Sig Start Date End Date Taking? Authorizing Provider  albuterol (PROVENTIL) (2.5 MG/3ML) 0.083% nebulizer solution INHALE 3 ML BY NEBULIZATION EVERY 6 HOURS AS NEEDED FOR WHEEZING OR SHORTNESS OF BREATH 04/26/22   Nche, Charlene Brooke, NP  amLODipine (NORVASC) 10 MG tablet Take 1 tablet (10 mg total) by mouth at bedtime. 04/01/22   Nche, Charlene Brooke, NP  atorvastatin (LIPITOR) 20 MG tablet Take 1 tablet (20 mg total) by mouth daily. 04/01/22   Nche, Charlene Brooke, NP  budesonide (PULMICORT) 0.25 MG/2ML nebulizer solution Use 1 vial (0.25 mg total) by nebulization 2 (two) times daily. 04/05/22   Nche, Charlene Brooke, NP  carvedilol (COREG) 25 MG tablet Take 1 tablet (25 mg total) by mouth 2 (two) times daily. 04/01/22   Nche, Charlene Brooke, NP  clonazePAM (KLONOPIN) 0.5 MG tablet Take 1/2 tablet (0.25 mg total) by mouth daily at bedtime. 02/17/22   Barb Merino, MD  DULoxetine (CYMBALTA) 60 MG capsule Take 1 capsule (60 mg total) by mouth daily. 04/01/22   Nche, Charlene Brooke, NP  fluticasone (FLONASE) 50 MCG/ACT nasal spray Place 2 sprays into both nostrils daily. 04/12/22   Nche, Charlene Brooke, NP   glucose blood (ONETOUCH VERIO) test strip Use as instructed 04/01/22   Nche, Charlene Brooke, NP  hydrALAZINE (APRESOLINE) 100 MG tablet Take 1 tablet (100 mg total) by mouth 3 (three) times daily. 04/01/22   Nche, Charlene Brooke, NP  insulin lispro (HUMALOG) 100 UNIT/ML KwikPen Inject 16 Units into the skin 3 (three) times daily with meals. 04/01/22   Nche, Charlene Brooke, NP  Insulin Pen Needle 32G X 4 MM MISC Use to inject insulin up to 4 times daily as directed 04/01/22   Nche, Charlene Brooke, NP  levocetirizine (XYZAL) 5 MG tablet Take 1 tablet (5 mg total) by mouth every evening. 04/12/22   Nche, Charlene Brooke, NP  loperamide (IMODIUM) 2 MG capsule Take 1 capsule (2 mg total) by mouth every 4 (four) hours as needed for diarrhea or loose stools. 02/17/22   Barb Merino, MD  losartan (COZAAR) 50 MG tablet Take 1 tablet (50 mg total) by mouth daily. 04/01/22   Nche, Charlene Brooke, NP  pantoprazole (PROTONIX) 40 MG tablet Take 1 tablet (40 mg total) by mouth daily. 04/01/22   Nche, Charlene Brooke, NP  potassium chloride SA (KLOR-CON M) 20 MEQ tablet Take 2 tablets (40 mEq total) by mouth daily. 04/03/22   Nche, Charlene Brooke, NP  promethazine (PHENERGAN) 25 MG tablet Take 1 tablet (25 mg total) by mouth every 6 (six) hours as needed for nausea or vomiting. 02/17/22   Barb Merino, MD  spironolactone (ALDACTONE) 25 MG tablet Take 1 tablet (  25 mg total) by mouth daily. 04/03/22   Nche, Charlene Brooke, NP  TRESIBA FLEXTOUCH 200 UNIT/ML FlexTouch Pen Inject 32 Units into the skin 2 (two) times daily. 04/01/22   Nche, Charlene Brooke, NP  insulin aspart (NOVOLOG) 100 UNIT/ML injection Insulin (Novolog) sliding scale for additional Novolog: administer 55minutes before meals. Sugar <150 - no units Sugar >151-200 - 4 units Sugar >200-250 - 6 units Sugar >251-300 - 10 units and call office. Patient taking differently: Inject 4-10 Units into the skin See admin instructions. Insulin (Novolog) sliding scale for additional  Novolog: administer 87minutes before meals. Sugar <150 - no units Sugar >151-200 - 4 units Sugar >200-250 - 6 units Sugar >251-300 - 10 units and call office. 11/22/21 12/17/21  Nche, Charlene Brooke, NP      Allergies    Patient has no known allergies.    Review of Systems   Review of Systems  Respiratory:  Positive for shortness of breath.     Physical Exam Updated Vital Signs BP (!) 195/92   Pulse 89   Temp 98.7 F (37.1 C) (Oral)   Resp 17   SpO2 100%  Physical Exam  ED Results / Procedures / Treatments   Labs (all labs ordered are listed, but only abnormal results are displayed) Labs Reviewed  CBC WITH DIFFERENTIAL/PLATELET - Abnormal; Notable for the following components:      Result Value   WBC 10.8 (*)    RBC 3.21 (*)    Hemoglobin 9.2 (*)    HCT 29.7 (*)    All other components within normal limits  BASIC METABOLIC PANEL - Abnormal; Notable for the following components:   CO2 21 (*)    Glucose, Bld 148 (*)    BUN 34 (*)    Creatinine, Ser 3.99 (*)    GFR, Estimated 13 (*)    All other components within normal limits  BRAIN NATRIURETIC PEPTIDE  TROPONIN I (HIGH SENSITIVITY)  TROPONIN I (HIGH SENSITIVITY)    EKG EKG Interpretation  Date/Time:  Sunday May 22 2022 20:20:12 EDT Ventricular Rate:  93 PR Interval:  150 QRS Duration: 151 QT Interval:  398 QTC Calculation: 496 R Axis:   10 Text Interpretation: Sinus rhythm Probable left atrial enlargement Right bundle branch block Confirmed by Dene Gentry [id number]) on 05/22/2022 11:14:14 PM  Radiology DG Chest Port 1 View  Result Date: 05/22/2022 CLINICAL DATA:  Shortness of breath. EXAM: PORTABLE CHEST 1 VIEW COMPARISON:  March 31, 2022 FINDINGS: A tracheostomy tube is in place. The heart size and mediastinal contours are within normal limits. Mildly decreased lung volumes are seen with mild, stable elevation of the right hemidiaphragm. Both lungs are clear. The visualized skeletal structures are  unremarkable. IMPRESSION: No active disease. Electronically Signed   By: Virgina Norfolk M.D.   On: 05/22/2022 20:19    Procedures Procedures  {Document cardiac monitor, telemetry assessment procedure when appropriate:1}  Medications Ordered in ED Medications  hydrALAZINE (APRESOLINE) injection 5 mg (5 mg Intravenous Given 05/22/22 2234)    ED Course/ Medical Decision Making/ A&P                           Medical Decision Making Amount and/or Complexity of Data Reviewed Labs: ordered. Radiology: ordered.  Risk Prescription drug management.   ***  {Document critical care time when appropriate:1} {Document review of labs and clinical decision tools ie heart score, Chads2Vasc2 etc:1}  {Document your independent review of radiology  images, and any outside records:1} {Document your discussion with family members, caretakers, and with consultants:1} {Document social determinants of health affecting pt's care:1} {Document your decision making why or why not admission, treatments were needed:1} Final Clinical Impression(s) / ED Diagnoses Final diagnoses:  Shortness of breath  Attention to tracheostomy ([place])  Hypertension, unspecified type  Lower extremity edema    Rx / DC Orders ED Discharge Orders     None

## 2022-05-22 NOTE — ED Provider Notes (Signed)
[facility] Provider Note   CSN: [id number] Arrival date & time: 05/22/22  1938     History  Chief Complaint  Patient presents with   Shortness of Breath    Maureen Jones is a 56 y.o. female.  Maureen Jones is a 56 y.o. female with a history of hypertension, diabetes and subglottic edema s/p tracheostomy, who presents to the emergency department for increased difficulty breathing.  She reports it felt like something was stuck in her airway or trach.  She tried changing the inner cannula at home prior to arrival without improvement.  Patient reports some increased secretions, occasional cough not significantly changed from her baseline.  No fevers or chills.  She denies any chest pain.  Reports chronic lower extremity edema that has not been improving but has not worsened significantly.  The history is provided by the patient and medical records.  Shortness of Breath Associated symptoms: no chest pain and no fever        Home Medications Prior to Admission medications   Medication Sig Start Date End Date Taking? Authorizing Provider  albuterol (PROVENTIL) (2.5 MG/3ML) 0.083% nebulizer solution INHALE 3 ML BY NEBULIZATION EVERY 6 HOURS AS NEEDED FOR WHEEZING OR SHORTNESS OF BREATH 04/26/22   Nche, Charlene Brooke, NP  amLODipine (NORVASC) 10 MG tablet Take 1 tablet (10 mg total) by mouth at bedtime. 04/01/22   Nche, Charlene Brooke, NP  atorvastatin (LIPITOR) 20 MG tablet Take 1 tablet (20 mg total) by mouth daily. 04/01/22   Nche, Charlene Brooke, NP  budesonide (PULMICORT) 0.25 MG/2ML nebulizer solution Use 1 vial (0.25 mg total) by nebulization 2 (two) times daily. 04/05/22   Nche, Charlene Brooke, NP  carvedilol (COREG) 25 MG tablet Take 1 tablet (25 mg total) by mouth 2 (two) times daily. 04/01/22   Nche, Charlene Brooke, NP  clonazePAM (KLONOPIN) 0.5 MG tablet Take 1/2 tablet (0.25 mg total) by mouth daily at bedtime. 02/17/22   Barb Merino, MD   DULoxetine (CYMBALTA) 60 MG capsule Take 1 capsule (60 mg total) by mouth daily. 04/01/22   Nche, Charlene Brooke, NP  fluticasone (FLONASE) 50 MCG/ACT nasal spray Place 2 sprays into both nostrils daily. 04/12/22   Nche, Charlene Brooke, NP  glucose blood (ONETOUCH VERIO) test strip Use as instructed 04/01/22   Nche, Charlene Brooke, NP  hydrALAZINE (APRESOLINE) 100 MG tablet Take 1 tablet (100 mg total) by mouth 3 (three) times daily. 04/01/22   Nche, Charlene Brooke, NP  insulin lispro (HUMALOG) 100 UNIT/ML KwikPen Inject 16 Units into the skin 3 (three) times daily with meals. 04/01/22   Nche, Charlene Brooke, NP  Insulin Pen Needle 32G X 4 MM MISC Use to inject insulin up to 4 times daily as directed 04/01/22   Nche, Charlene Brooke, NP  levocetirizine (XYZAL) 5 MG tablet Take 1 tablet (5 mg total) by mouth every evening. 04/12/22   Nche, Charlene Brooke, NP  loperamide (IMODIUM) 2 MG capsule Take 1 capsule (2 mg total) by mouth every 4 (four) hours as needed for diarrhea or loose stools. 02/17/22   Barb Merino, MD  losartan (COZAAR) 50 MG tablet Take 1 tablet (50 mg total) by mouth daily. 04/01/22   Nche, Charlene Brooke, NP  pantoprazole (PROTONIX) 40 MG tablet Take 1 tablet (40 mg total) by mouth daily. 04/01/22   Nche, Charlene Brooke, NP  potassium chloride SA (KLOR-CON M) 20 MEQ tablet Take 2 tablets (40 mEq total) by mouth daily. 04/03/22   Nche,  Charlene Brooke, NP  promethazine (PHENERGAN) 25 MG tablet Take 1 tablet (25 mg total) by mouth every 6 (six) hours as needed for nausea or vomiting. 02/17/22   Barb Merino, MD  spironolactone (ALDACTONE) 25 MG tablet Take 1 tablet (25 mg total) by mouth daily. 04/03/22   Nche, Charlene Brooke, NP  TRESIBA FLEXTOUCH 200 UNIT/ML FlexTouch Pen Inject 32 Units into the skin 2 (two) times daily. 04/01/22   Nche, Charlene Brooke, NP  insulin aspart (NOVOLOG) 100 UNIT/ML injection Insulin (Novolog) sliding scale for additional Novolog: administer 16minutes before meals. Sugar <150 - no  units Sugar >151-200 - 4 units Sugar >200-250 - 6 units Sugar >251-300 - 10 units and call office. Patient taking differently: Inject 4-10 Units into the skin See admin instructions. Insulin (Novolog) sliding scale for additional Novolog: administer 8minutes before meals. Sugar <150 - no units Sugar >151-200 - 4 units Sugar >200-250 - 6 units Sugar >251-300 - 10 units and call office. 11/22/21 12/17/21  Nche, Charlene Brooke, NP      Allergies    Patient has no known allergies.    Review of Systems   Review of Systems  Constitutional:  Negative for chills and fever.  Respiratory:  Positive for shortness of breath.   Cardiovascular:  Positive for leg swelling. Negative for chest pain.  All other systems reviewed and are negative.   Physical Exam Updated Vital Signs BP (!) 195/92   Pulse 89   Temp 98.7 F (37.1 C) (Oral)   Resp 17   SpO2 100%  Physical Exam Vitals and nursing note reviewed.  Constitutional:      General: She is not in acute distress.    Appearance: Normal appearance. She is well-developed. She is not diaphoretic.     Comments: Alert, chronically ill-appearing but not in acute distress  HENT:     Head: Normocephalic and atraumatic.  Eyes:     General:        Right eye: No discharge.        Left eye: No discharge.     Pupils: Pupils are equal, round, and reactive to light.  Neck:     Comments: Tracheostomy present, clear after RT suctioning Cardiovascular:     Rate and Rhythm: Normal rate and regular rhythm.     Pulses: Normal pulses.     Heart sounds: Normal heart sounds.  Pulmonary:     Effort: Pulmonary effort is normal. No respiratory distress.     Breath sounds: Normal breath sounds. No wheezing or rales.     Comments: Respirations equal and unlabored after trach suctioning, patient able to speak in full sentences, lungs clear to auscultation bilaterally  Chest:     Chest wall: No tenderness.  Abdominal:     General: Bowel sounds are normal. There  is no distension.     Palpations: Abdomen is soft. There is no mass.     Tenderness: There is no abdominal tenderness. There is no guarding.     Comments: Abdomen soft, nondistended, nontender to palpation in all quadrants without guarding or peritoneal signs  Musculoskeletal:        General: No deformity.     Right lower leg: Edema present.     Left lower leg: Edema present.     Comments: Bilateral chronic lower extremity swelling  Skin:    General: Skin is warm and dry.     Capillary Refill: Capillary refill takes less than 2 seconds.  Neurological:     Mental  Status: She is alert and oriented to person, place, and time.     Coordination: Coordination normal.     Comments: Speech is clear, able to follow commands Moves extremities without ataxia, coordination intact  Psychiatric:        Mood and Affect: Mood normal.        Behavior: Behavior normal.     ED Results / Procedures / Treatments   Labs (all labs ordered are listed, but only abnormal results are displayed) Labs Reviewed  CBC WITH DIFFERENTIAL/PLATELET - Abnormal; Notable for the following components:      Result Value   WBC 10.8 (*)    RBC 3.21 (*)    Hemoglobin 9.2 (*)    HCT 29.7 (*)    All other components within normal limits  BASIC METABOLIC PANEL - Abnormal; Notable for the following components:   CO2 21 (*)    Glucose, Bld 148 (*)    BUN 34 (*)    Creatinine, Ser 3.99 (*)    GFR, Estimated 13 (*)    All other components within normal limits  BRAIN NATRIURETIC PEPTIDE  TROPONIN I (HIGH SENSITIVITY)  TROPONIN I (HIGH SENSITIVITY)    EKG EKG Interpretation  Date/Time:  Sunday May 22 2022 20:20:12 EDT Ventricular Rate:  93 PR Interval:  150 QRS Duration: 151 QT Interval:  398 QTC Calculation: 496 R Axis:   10 Text Interpretation: Sinus rhythm Probable left atrial enlargement Right bundle branch block Confirmed by Dene Gentry [id number]) on 05/22/2022 11:14:14 PM  Radiology DG Chest Port 1  View  Result Date: 05/22/2022 CLINICAL DATA:  Shortness of breath. EXAM: PORTABLE CHEST 1 VIEW COMPARISON:  March 31, 2022 FINDINGS: A tracheostomy tube is in place. The heart size and mediastinal contours are within normal limits. Mildly decreased lung volumes are seen with mild, stable elevation of the right hemidiaphragm. Both lungs are clear. The visualized skeletal structures are unremarkable. IMPRESSION: No active disease. Electronically Signed   By: Virgina Norfolk M.D.   On: 05/22/2022 20:19    Procedures Procedures    Medications Ordered in ED Medications  hydrALAZINE (APRESOLINE) injection 5 mg (5 mg Intravenous Given 05/22/22 2234)    ED Course/ Medical Decision Making/ A&P                           Medical Decision Making Amount and/or Complexity of Data Reviewed Labs: ordered. Radiology: ordered.  Risk Prescription drug management.   56 y.o. female presents to the ED with complaints of shortness of breath and feeling as if her trach was obstructed, this involves an extensive number of treatment options, and is a complaint that carries with it a high risk of complications and morbidity.  The differential diagnosis includes tracheostomy obstruction, mucous plugging, pneumonia, CHF  On arrival pt is nontoxic, vitals significant for hypertension with systolic in the 500X, patient missed her blood pressure medications prior to coming in, vitals otherwise normal and patient on home FiO2.   RT immediately evaluated patient upon arrival and provided suctioning of trach, they were able to remove a large mucous plug and afterwards patient had immediate improvement in her symptoms.  She now reports that she feels much better.  Does also complain of some persistent lower extremity edema  Additional history obtained from chart review. Previous records obtained and reviewed   I ordered medication including patient's home p.o. hydralazine as well as 5 mg of IV hydralazine  Lab  Tests:  I Ordered, reviewed, and interpreted labs, which included: Minimal leukocytosis and stable hemoglobin, creatinine slightly worsened from prior, 3.99 today was previously 3.19 but otherwise electrolytes unremarkable, troponin and BNP not elevated  Imaging Studies ordered:  I ordered imaging studies which included chest x-ray, I independently visualized and interpreted imaging which showed no active cardiopulmonary disease, tracheostomy tube in place.  ED Course:   After suctioning of the trach a large mucous plug was removed and this relieved patient's shortness of breath, chest x-ray with no evidence of pneumonia or pulmonary edema and labs overall reassuring, slight worsening of kidney function which appears to be a chronic trend, could be in the setting of hypertension.  Patient's blood pressure improving after receiving BP meds here in the ED we will have her follow-up closely with her primary care provider.  Encouraged to follow-up regarding persistent lower extremity swelling as well. [facility] called for transport by nursing  At this time there does not appear to be any evidence of an acute emergency medical condition requiring further emergent evaluation and the patient appears stable for discharge with appropriate outpatient follow up. Diagnosis and return precautions discussed with patient who verbalizes understanding and is agreeable to discharge.     Portions of this note were generated with [employer]. Dictation errors may occur despite best attempts at proofreading.         Final Clinical Impression(s) / ED Diagnoses Final diagnoses:  Shortness of breath  Attention to tracheostomy [facility])  Hypertension, unspecified type  Lower extremity edema    Rx / DC Orders ED Discharge Orders     None         Janet Berlin 06/13/22 2130    Valarie Merino, MD 06/14/22 1159

## 2022-05-23 DIAGNOSIS — R531 Weakness: Secondary | ICD-10-CM | POA: Diagnosis not present

## 2022-05-23 DIAGNOSIS — Z743 Need for continuous supervision: Secondary | ICD-10-CM | POA: Diagnosis not present

## 2022-05-23 MED ORDER — HYDRALAZINE HCL 25 MG PO TABS
100.0000 mg | ORAL_TABLET | Freq: Once | ORAL | Status: AC
  Administered 2022-05-23: 100 mg via ORAL
  Filled 2022-05-23: qty 4

## 2022-05-23 NOTE — ED Notes (Signed)
Patient made aware of plan of care

## 2022-05-23 NOTE — ED Notes (Signed)
[facility] was called next in line

## 2022-05-23 NOTE — ED Notes (Signed)
Patient resting quietly in bed.  No complaints voiced at this time.  Patient aware of plan of care

## 2022-05-28 DIAGNOSIS — J45909 Unspecified asthma, uncomplicated: Secondary | ICD-10-CM | POA: Diagnosis not present

## 2022-05-28 DIAGNOSIS — J383 Other diseases of vocal cords: Secondary | ICD-10-CM | POA: Diagnosis not present

## 2022-06-03 ENCOUNTER — Telehealth: Payer: Self-pay | Admitting: Nurse Practitioner

## 2022-06-06 ENCOUNTER — Ambulatory Visit (HOSPITAL_COMMUNITY)
Admission: RE | Admit: 2022-06-06 | Discharge: 2022-06-06 | Disposition: A | Discharge status: Home / Self Care | Payer: Medicare Other | Source: Ambulatory Visit | Attending: Acute Care | Admitting: Acute Care

## 2022-06-06 ENCOUNTER — Other Ambulatory Visit: Payer: Self-pay | Admitting: Acute Care

## 2022-06-06 DIAGNOSIS — I10 Essential (primary) hypertension: Secondary | ICD-10-CM | POA: Diagnosis not present

## 2022-06-06 DIAGNOSIS — J386 Stenosis of larynx: Secondary | ICD-10-CM | POA: Diagnosis not present

## 2022-06-06 DIAGNOSIS — Z930 Tracheostomy status: Secondary | ICD-10-CM | POA: Diagnosis not present

## 2022-06-06 DIAGNOSIS — Z430 Encounter for attention to tracheostomy: Secondary | ICD-10-CM | POA: Diagnosis not present

## 2022-06-06 DIAGNOSIS — J383 Other diseases of vocal cords: Secondary | ICD-10-CM | POA: Insufficient documentation

## 2022-06-06 DIAGNOSIS — Z87891 Personal history of nicotine dependence: Secondary | ICD-10-CM | POA: Diagnosis not present

## 2022-06-06 DIAGNOSIS — J45909 Unspecified asthma, uncomplicated: Secondary | ICD-10-CM | POA: Insufficient documentation

## 2022-06-06 MED ORDER — PANTOPRAZOLE SODIUM 40 MG PO TBEC: 40.0000 mg | DELAYED_RELEASE_TABLET | Freq: Two times a day (BID) | ORAL | 0 refills | Status: DC

## 2022-06-06 MED ORDER — LORATADINE 10 MG PO TBDP: 10.0000 mg | ORAL_TABLET | Freq: Every day | ORAL | 11 refills | Status: DC

## 2022-06-15 DIAGNOSIS — Z930 Tracheostomy status: Secondary | ICD-10-CM | POA: Diagnosis not present

## 2022-06-15 DIAGNOSIS — J45909 Unspecified asthma, uncomplicated: Secondary | ICD-10-CM | POA: Diagnosis not present

## 2022-06-15 DIAGNOSIS — J9600 Acute respiratory failure, unspecified whether with hypoxia or hypercapnia: Secondary | ICD-10-CM | POA: Diagnosis not present

## 2022-06-20 DIAGNOSIS — Z930 Tracheostomy status: Secondary | ICD-10-CM | POA: Diagnosis not present

## 2022-06-27 DIAGNOSIS — J45909 Unspecified asthma, uncomplicated: Secondary | ICD-10-CM | POA: Diagnosis not present

## 2022-06-27 DIAGNOSIS — J383 Other diseases of vocal cords: Secondary | ICD-10-CM | POA: Diagnosis not present

## 2022-07-04 ENCOUNTER — Encounter: Payer: Self-pay | Admitting: Nurse Practitioner

## 2022-07-04 ENCOUNTER — Telehealth: Payer: Self-pay | Admitting: Nurse Practitioner

## 2022-07-04 ENCOUNTER — Telehealth: Payer: Self-pay

## 2022-07-04 ENCOUNTER — Ambulatory Visit (INDEPENDENT_AMBULATORY_CARE_PROVIDER_SITE_OTHER): Payer: Medicare Other | Admitting: Nurse Practitioner

## 2022-07-04 VITALS — BP 186/112 | HR 101 | Temp 97.3°F | Ht 66.0 in | Wt 236.4 lb

## 2022-07-04 DIAGNOSIS — E1141 Type 2 diabetes mellitus with diabetic mononeuropathy: Secondary | ICD-10-CM

## 2022-07-04 DIAGNOSIS — I10 Essential (primary) hypertension: Secondary | ICD-10-CM

## 2022-07-04 DIAGNOSIS — N185 Chronic kidney disease, stage 5: Secondary | ICD-10-CM

## 2022-07-04 DIAGNOSIS — R479 Unspecified speech disturbances: Secondary | ICD-10-CM | POA: Diagnosis not present

## 2022-07-04 DIAGNOSIS — L89890 Pressure ulcer of other site, unstageable: Secondary | ICD-10-CM

## 2022-07-04 DIAGNOSIS — F39 Unspecified mood [affective] disorder: Secondary | ICD-10-CM

## 2022-07-04 DIAGNOSIS — I160 Hypertensive urgency: Secondary | ICD-10-CM

## 2022-07-04 DIAGNOSIS — N184 Chronic kidney disease, stage 4 (severe): Secondary | ICD-10-CM

## 2022-07-04 DIAGNOSIS — E1169 Type 2 diabetes mellitus with other specified complication: Secondary | ICD-10-CM

## 2022-07-04 DIAGNOSIS — Z794 Long term (current) use of insulin: Secondary | ICD-10-CM | POA: Diagnosis not present

## 2022-07-04 LAB — RENAL FUNCTION PANEL
Albumin: 3.4 g/dL — ABNORMAL LOW (ref 3.5–5.2)
BUN: 40 mg/dL — ABNORMAL HIGH (ref 6–23)
CO2: 23 mEq/L (ref 19–32)
Calcium: 8.8 mg/dL (ref 8.4–10.5)
Chloride: 105 mEq/L (ref 96–112)
Creatinine, Ser: 4.73 mg/dL (ref 0.40–1.20)
GFR: 9.86 mL/min — CL (ref >60.00)
Glucose, Bld: 417 mg/dL — ABNORMAL HIGH (ref 70–99)
Phosphorus: 2.9 mg/dL (ref 2.3–4.6)
Potassium: 4.4 mEq/L (ref 3.5–5.1)
Sodium: 137 mEq/L (ref 135–145)

## 2022-07-04 LAB — HEMOGLOBIN A1C: Hgb A1c MFr Bld: 8.5 % — ABNORMAL HIGH (ref 4.6–6.5)

## 2022-07-04 MED ORDER — FREESTYLE LIBRE 14 DAY SENSOR MISC: 1.0000 [IU] | 11 refills | Status: DC

## 2022-07-04 MED ORDER — FREESTYLE LIBRE READER DEVI: 1.0000 [IU] | Freq: Once | 0 refills | Status: AC

## 2022-07-04 MED ORDER — SPIRONOLACTONE 25 MG PO TABS: 25.0000 mg | ORAL_TABLET | Freq: Every day | ORAL | 1 refills | Status: DC

## 2022-07-04 NOTE — Assessment & Plan Note (Signed)
No improvement with cymbalta. She declined additional medication at this time. No SI or HI or hallucination Will like referral to psychology.

## 2022-07-04 NOTE — Telephone Encounter (Signed)
Hope called from [facility] on 07/04/22 1:00pm with 2 critical labs Creatinine 4.73 GFR 9.86  Reported to [facility]

## 2022-07-04 NOTE — Patient Instructions (Addendum)
Go to lab You will be contacted to schedule appt with neurology, nephrology, podiatry and nutritionist. Check glucose before meals and at bedtime. Take all medications as prescribed. Maintain DASH diet. Bring glucose readings to next appt F/up in 2weeks  DASH Eating Plan DASH stands for Dietary Approaches to Stop Hypertension. The DASH eating plan is a healthy eating plan that has been shown to: Reduce high blood pressure (hypertension). Reduce your risk for type 2 diabetes, heart disease, and stroke. Help with weight loss. What are tips for following this plan? Reading food labels Check food labels for the amount of salt (sodium) per serving. Choose foods with less than 5 percent of the Daily Value of sodium. Generally, foods with less than 300 milligrams (mg) of sodium per serving fit into this eating plan. To find whole grains, look for the word "whole" as the first word in the ingredient list. Shopping Buy products labeled as "low-sodium" or "no salt added." Buy fresh foods. Avoid canned foods and pre-made or frozen meals. Cooking Avoid adding salt when cooking. Use salt-free seasonings or herbs instead of table salt or sea salt. Check with your health care provider or pharmacist before using salt substitutes. Do not fry foods. Cook foods using healthy methods such as baking, boiling, grilling, roasting, and broiling instead. Cook with heart-healthy oils, such as olive, canola, avocado, soybean, or sunflower oil. Meal planning  Eat a balanced diet that includes: 4 or more servings of fruits and 4 or more servings of vegetables each day. Try to fill one-half of your plate with fruits and vegetables. 6-8 servings of whole grains each day. Less than 6 oz (170 g) of lean meat, poultry, or fish each day. A 3-oz (85-g) serving of meat is about the same size as a deck of cards. One egg equals 1 oz (28 g). 2-3 servings of low-fat dairy each day. One serving is 1 cup (237 mL). 1 serving of  nuts, seeds, or beans 5 times each week. 2-3 servings of heart-healthy fats. Healthy fats called omega-3 fatty acids are found in foods such as walnuts, flaxseeds, fortified milks, and eggs. These fats are also found in cold-water fish, such as sardines, salmon, and mackerel. Limit how much you eat of: Canned or prepackaged foods. Food that is high in trans fat, such as some fried foods. Food that is high in saturated fat, such as fatty meat. Desserts and other sweets, sugary drinks, and other foods with added sugar. Full-fat dairy products. Do not salt foods before eating. Do not eat more than 4 egg yolks a week. Try to eat at least 2 vegetarian meals a week. Eat more home-cooked food and less restaurant, buffet, and fast food. Lifestyle When eating at a restaurant, ask that your food be prepared with less salt or no salt, if possible. If you drink alcohol: Limit how much you use to: 0-1 drink a day for women who are not pregnant. 0-2 drinks a day for men. Be aware of how much alcohol is in your drink. In the [place] one drink equals one 12 oz bottle of beer (355 mL), one 5 oz glass of wine (148 mL), or one 1 oz glass of hard liquor (44 mL). General information Avoid eating more than 2,300 mg of salt a day. If you have hypertension, you may need to reduce your sodium intake to 1,500 mg a day. Work with your health care provider to maintain a healthy body weight or to lose weight. Ask what an ideal  weight is for you. Get at least 30 minutes of exercise that causes your heart to beat faster (aerobic exercise) most days of the week. Activities may include walking, swimming, or biking. Work with your health care provider or dietitian to adjust your eating plan to your individual calorie needs. What foods should I eat? Fruits All fresh, dried, or frozen fruit. Canned fruit in natural juice (without added sugar). Vegetables Fresh or frozen vegetables (raw, steamed, roasted, or grilled).  Low-sodium or reduced-sodium tomato and vegetable juice. Low-sodium or reduced-sodium tomato sauce and tomato paste. Low-sodium or reduced-sodium canned vegetables. Grains Whole-grain or whole-wheat bread. Whole-grain or whole-wheat pasta. Brown rice. [place]. Bulgur. Whole-grain and low-sodium cereals. Pita bread. Low-fat, low-sodium crackers. Whole-wheat flour tortillas. Meats and other proteins Skinless chicken or [place]. Ground chicken or [place]. Pork with fat trimmed off. Fish and seafood. Egg whites. Dried beans, peas, or lentils. Unsalted nuts, nut butters, and seeds. Unsalted canned beans. Lean cuts of beef with fat trimmed off. Low-sodium, lean precooked or cured meat, such as sausages or meat loaves. Dairy Low-fat (1%) or fat-free (skim) milk. Reduced-fat, low-fat, or fat-free cheeses. Nonfat, low-sodium ricotta or cottage cheese. Low-fat or nonfat yogurt. Low-fat, low-sodium cheese. Fats and oils Soft margarine without trans fats. Vegetable oil. Reduced-fat, low-fat, or light mayonnaise and salad dressings (reduced-sodium). Canola, safflower, olive, avocado, soybean, and sunflower oils. Avocado. Seasonings and condiments Herbs. Spices. Seasoning mixes without salt. Other foods Unsalted popcorn and pretzels. Fat-free sweets. The items listed above may not be a complete list of foods and beverages you can eat. Contact a dietitian for more information. What foods should I avoid? Fruits Canned fruit in a light or heavy syrup. Fried fruit. Fruit in cream or butter sauce. Vegetables Creamed or fried vegetables. Vegetables in a cheese sauce. Regular canned vegetables (not low-sodium or reduced-sodium). Regular canned tomato sauce and paste (not low-sodium or reduced-sodium). Regular tomato and vegetable juice (not low-sodium or reduced-sodium). Angie Fava. Olives. Grains Baked goods made with fat, such as croissants, muffins, or some breads. Dry pasta or rice meal packs. Meats and other  proteins Fatty cuts of meat. Ribs. Fried meat. Berniece Salines. Bologna, salami, and other precooked or cured meats, such as sausages or meat loaves. Fat from the back of a pig (fatback). Bratwurst. Salted nuts and seeds. Canned beans with added salt. Canned or smoked fish. Whole eggs or egg yolks. Chicken or [place] with skin. Dairy Whole or 2% milk, cream, and half-and-half. Whole or full-fat cream cheese. Whole-fat or sweetened yogurt. Full-fat cheese. Nondairy creamers. Whipped toppings. Processed cheese and cheese spreads. Fats and oils Butter. Stick margarine. Lard. Shortening. Ghee. Bacon fat. Tropical oils, such as coconut, palm kernel, or palm oil. Seasonings and condiments Onion salt, garlic salt, seasoned salt, table salt, and sea salt. Worcestershire sauce. Tartar sauce. Barbecue sauce. Teriyaki sauce. Soy sauce, including reduced-sodium. Steak sauce. Canned and packaged gravies. Fish sauce. Oyster sauce. Cocktail sauce. Store-bought horseradish. Ketchup. Mustard. Meat flavorings and tenderizers. Bouillon cubes. Hot sauces. Pre-made or packaged marinades. Pre-made or packaged taco seasonings. Relishes. Regular salad dressings. Other foods Salted popcorn and pretzels. The items listed above may not be a complete list of foods and beverages you should avoid. Contact a dietitian for more information. Where to find more information National Heart, Lung, and Blood Institute: https://wilson-eaton.com/ American Heart Association: www.heart.org Academy of Nutrition and Dietetics: www.eatright.[place]: www.kidney.org Summary The DASH eating plan is a healthy eating plan that has been shown to reduce high blood pressure (  hypertension). It may also reduce your risk for type 2 diabetes, heart disease, and stroke. When on the DASH eating plan, aim to eat more fresh fruits and vegetables, whole grains, lean proteins, low-fat dairy, and heart-healthy fats. With the DASH eating plan, you should  limit salt (sodium) intake to 2,300 mg a day. If you have hypertension, you may need to reduce your sodium intake to 1,500 mg a day. Work with your health care provider or dietitian to adjust your eating plan to your individual calorie needs. This information is not intended to replace advice given to you by your health care provider. Make sure you discuss any questions you have with your health care provider. Document Revised: 11/01/2019 Document Reviewed: 11/01/2019 Elsevier Patient Education  [place].

## 2022-07-04 NOTE — Telephone Encounter (Signed)
Significant decline in renal function and very elevated glucose. Need to go to ED

## 2022-07-04 NOTE — Progress Notes (Signed)
Established Patient Visit  Patient: Maureen Jones   DOB: 10-Aug-1966   56 y.o. Female  MRN: [id number] Visit Date: 07/05/2022  Subjective:    Chief Complaint  Patient presents with   Office Visit    HTN F/u C/o memory issues & mental state Has other concerns, but knows to schedule appt to discuss.   HPI  Mood disorder ([place]) No improvement with cymbalta. She declined additional medication at this time. No SI or HI or hallucination Will like referral to psychology.  Resistant hypertension BP not at goal with amlodipine, coreg, losartan and hydralazine Does not remember last time spironolactone was taken Persistent LE edema. Last echo 2022: normal EF 65-70%, moderate LVH,limited exam due to body habitus. 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 Advised Maureen Jones about her risk of end stage CKD with dialysis, CVA, MI and even death if BP and glucose are not controlled. Advised about the importance of diet and medication compliance. Decrease spironolactone to 12.5 daily, start clonidine 0.1mg  BID. F/up in 2weeks  CKD (chronic kidney disease) stage 5, GFR less than 15 ml/min (HCC) She did not schedule appt with nephrology as previously instructed. Repeat renal function:  advised about Significant decline in renal function due to uncontrolled HTN and DM: stage 5. Entered any urgent referral to nephrology and referral to the hypertensive clinic.  Advised to schedule these appts ASAP.  Decrease spironolactone to 12.5 daily, start clonidine 0.1mg  BID. F/up in 2weeks  Reviewed medical, surgical, and social history today  Medications: Outpatient Medications Prior to Visit  Medication Sig   albuterol (PROVENTIL) (2.5 MG/3ML) 0.083% nebulizer solution INHALE 3 ML BY NEBULIZATION EVERY 6 HOURS AS NEEDED FOR WHEEZING OR SHORTNESS OF BREATH   amLODipine  (NORVASC) 10 MG tablet Take 1 tablet (10 mg total) by mouth at bedtime.   atorvastatin (LIPITOR) 20 MG tablet Take 1 tablet (20 mg total) by mouth daily.   budesonide (PULMICORT) 0.25 MG/2ML nebulizer solution Use 1 vial (0.25 mg total) by nebulization 2 (two) times daily.   carvedilol (COREG) 25 MG tablet Take 1 tablet (25 mg total) by mouth 2 (two) times daily.   clonazePAM (KLONOPIN) 0.5 MG tablet Take 1/2 tablet (0.25 mg total) by mouth daily at bedtime.   DULoxetine (CYMBALTA) 60 MG capsule Take 1 capsule (60 mg total) by mouth daily.   fluticasone (FLONASE) 50 MCG/ACT nasal spray Place 2 sprays into both nostrils daily.   glucose blood (ONETOUCH VERIO) test strip Use as instructed   hydrALAZINE (APRESOLINE) 100 MG tablet Take 1 tablet (100 mg total) by mouth 3 (three) times daily.   insulin lispro (HUMALOG) 100 UNIT/ML KwikPen Inject 16 Units into the skin 3 (three) times daily with meals.   Insulin Pen Needle 32G X 4 MM MISC Use to inject insulin up to 4 times daily as directed   levocetirizine (XYZAL) 5 MG tablet Take 1 tablet (5 mg total) by mouth every evening.   loperamide (IMODIUM) 2 MG capsule Take 1 capsule (2 mg total) by mouth every 4 (four) hours as needed for diarrhea or loose stools.   loratadine (CLARITIN REDITABS) 10 MG dissolvable tablet Take 1 tablet (10 mg total) by mouth daily.   losartan (COZAAR) 50 MG tablet Take 1 tablet (50 mg total) by mouth daily.   pantoprazole (  PROTONIX) 40 MG tablet Take 1 tablet (40 mg total) by mouth 2 (two) times daily. Take twice a day for 1 month, then go back to daily   promethazine (PHENERGAN) 25 MG tablet Take 1 tablet (25 mg total) by mouth every 6 (six) hours as needed for nausea or vomiting.   TRESIBA FLEXTOUCH 200 UNIT/ML FlexTouch Pen Inject 32 Units into the skin 2 (two) times daily.   [DISCONTINUED] pantoprazole (PROTONIX) 40 MG tablet Take 1 tablet (40 mg total) by mouth daily.   [DISCONTINUED] potassium chloride SA (KLOR-CON M) 20  MEQ tablet Take 2 tablets (40 mEq total) by mouth daily.   [DISCONTINUED] spironolactone (ALDACTONE) 25 MG tablet Take 1 tablet (25 mg total) by mouth daily.   No facility-administered medications prior to visit.   Reviewed past medical and social history.   ROS per HPI above      Objective:  BP (!) 186/112 (BP Location: Right Arm, Patient Position: Sitting, Cuff Size: Normal)   Pulse (!) 101   Temp (!) 97.3 F (36.3 C) (Temporal)   Ht 5\' 6"  (1.676 m)   Wt 236 lb 6.4 oz (107.2 kg)   SpO2 98%   BMI 38.16 kg/m      Physical Exam  Results for orders placed or performed in visit on 07/04/22  Hemoglobin A1c  Result Value Ref Range   Hgb A1c MFr Bld 8.5 (H) 4.6 - 6.5 %  Renal Function Panel  Result Value Ref Range   Sodium 137 135 - 145 mEq/L   Potassium 4.4 3.5 - 5.1 mEq/L   Chloride 105 96 - 112 mEq/L   CO2 23 19 - 32 mEq/L   Albumin 3.4 (L) 3.5 - 5.2 g/dL   BUN 40 (H) 6 - 23 mg/dL   Creatinine, Ser 4.73 (HH) 0.40 - 1.20 mg/dL   Glucose, Bld 417 (H) 70 - 99 mg/dL   Phosphorus 2.9 2.3 - 4.6 mg/dL   GFR 9.86 (LL) >60.00 mL/min   Calcium 8.8 8.4 - 10.5 mg/dL      Assessment & Plan:    Problem List Items Addressed This Visit       Cardiovascular and Mediastinum   Resistant hypertension    BP not at goal with amlodipine, coreg, losartan and hydralazine Does not remember last time spironolactone was taken Persistent LE edema. Last echo 2022: normal EF 65-70%, moderate LVH,limited exam due to body habitus. 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 Advised Maureen Jones about her risk of end stage CKD with dialysis, CVA, MI and even death if BP and glucose are not controlled. Advised about the importance of diet and medication compliance. Decrease spironolactone to 12.5 daily, start clonidine 0.1mg  BID. F/up in 2weeks      Relevant Medications    spironolactone (ALDACTONE) 25 MG tablet   cloNIDine (CATAPRES) 0.1 MG tablet   Other Relevant Orders   Ambulatory referral to [facility]   AMB Referral to [place]     Endocrine   DM (diabetes mellitus) ([place]) - Primary   Relevant Medications   Continuous Blood Gluc Sensor (FREESTYLE LIBRE 14 DAY SENSOR) MISC   Other Relevant Orders   Referral to Nutrition and Diabetes Services   Hemoglobin A1c (Completed)   AMB Referral to [place]     Genitourinary   CKD (chronic kidney disease) stage 5,  GFR less than 15 ml/min (HCC)    She did not schedule appt with nephrology as previously instructed. Repeat renal function:  advised about Significant decline in renal function due to uncontrolled HTN and DM: stage 5. Entered any urgent referral to nephrology and referral to the hypertensive clinic.  Advised to schedule these appts ASAP.  Decrease spironolactone to 12.5 daily, start clonidine 0.1mg  BID. F/up in 2weeks        Other   Mood disorder (HCC)    No improvement with cymbalta. She declined additional medication at this time. No SI or HI or hallucination Will like referral to psychology.      Relevant Orders   Ambulatory referral to Psychology   Other Visit Diagnoses     Difficulty with speech       Relevant Orders   Ambulatory referral to Neurology   Pressure injury of toe of left foot, unstageable ([place])       Relevant Orders   Ambulatory referral to Podiatry      Return in about 2 weeks (around 07/18/2022) for HTN and DM (42mins only).     Wilfred Lacy, NP

## 2022-07-05 ENCOUNTER — Encounter: Payer: Self-pay | Admitting: Nurse Practitioner

## 2022-07-05 DIAGNOSIS — R479 Unspecified speech disturbances: Secondary | ICD-10-CM | POA: Insufficient documentation

## 2022-07-05 MED ORDER — CLONIDINE HCL 0.1 MG PO TABS: 0.1000 mg | ORAL_TABLET | Freq: Two times a day (BID) | ORAL | 3 refills | Status: DC

## 2022-07-05 MED ORDER — SPIRONOLACTONE 25 MG PO TABS: 12.5000 mg | ORAL_TABLET | Freq: Every day | ORAL | 1 refills | Status: DC

## 2022-07-05 NOTE — Assessment & Plan Note (Signed)
Non compliant with insulin dosing, diet and glucose monitoring Reports she takes humalog 16units TID and tresiba 32units BID  Advised about the dangers of hypoglycemia, using insulin improperly, and complications of uncontrolled DM. Informed her that I will not be able to provide additional insulin refills without glucose readings. She verbalized understanding. Provided freestyle libre CGM to avoid multiple finger sticks and promote compliance with glucose monitoring. Entered referral to endocrinology. F/up in 2weeks

## 2022-07-05 NOTE — Assessment & Plan Note (Signed)
She has noticed difficulty finding words, ongoing for a while but got worse in last 53months, denies any memory difficulty or slurry speech MRI Brain 2022: No acute infarction, hemorrhage, hydrocephalus, extra-axial collection or mass lesion. Mild T2 hyperintense signal in the periventricular white matter, likely the sequela of chronic small vessel ischemic disease. 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 Mildly prominent ventricles for age, which may reflect central atrophy. Current use of atorvastatin, needs better control of BP and glucose Entered referral to neurology

## 2022-07-05 NOTE — Assessment & Plan Note (Signed)
She did not schedule appt with nephrology as previously instructed. Repeat renal function:  advised about Significant decline in renal function due to uncontrolled HTN and DM: stage 5. Entered any urgent referral to nephrology and referral to the hypertensive clinic.  Advised to schedule these appts ASAP.  Decrease spironolactone to 12.5 daily, start clonidine 0.1mg  BID. F/up in 2weeks

## 2022-07-05 NOTE — Assessment & Plan Note (Addendum)
BP not at goal with amlodipine, coreg, losartan and hydralazine Does not remember last time spironolactone was taken Persistent LE edema. Last echo 2022: normal EF 65-70%, moderate LVH,limited exam due to body habitus. 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 Advised Maureen Jones about her risk of end stage CKD with dialysis, CVA, MI and even death if BP and glucose are not controlled. Advised about the importance of diet and medication compliance. Decrease spironolactone to 12.5 daily, start clonidine 0.1mg  BID. F/up in 2weeks

## 2022-07-14 ENCOUNTER — Ambulatory Visit (INDEPENDENT_AMBULATORY_CARE_PROVIDER_SITE_OTHER): Payer: Medicare Other | Admitting: Podiatry

## 2022-07-14 ENCOUNTER — Encounter: Payer: Self-pay | Admitting: Podiatry

## 2022-07-14 DIAGNOSIS — L280 Lichen simplex chronicus: Secondary | ICD-10-CM | POA: Diagnosis not present

## 2022-07-14 DIAGNOSIS — Z794 Long term (current) use of insulin: Secondary | ICD-10-CM | POA: Diagnosis not present

## 2022-07-14 DIAGNOSIS — L84 Corns and callosities: Secondary | ICD-10-CM

## 2022-07-14 DIAGNOSIS — E1142 Type 2 diabetes mellitus with diabetic polyneuropathy: Secondary | ICD-10-CM | POA: Diagnosis not present

## 2022-07-14 MED ORDER — TRIAMCINOLONE ACETONIDE 0.1 % EX CREA: 1.0000 | TOPICAL_CREAM | Freq: Two times a day (BID) | CUTANEOUS | 0 refills | Status: DC

## 2022-07-15 ENCOUNTER — Other Ambulatory Visit: Payer: Self-pay | Admitting: *Deleted

## 2022-07-15 NOTE — Progress Notes (Signed)
error 

## 2022-07-16 DIAGNOSIS — Z930 Tracheostomy status: Secondary | ICD-10-CM | POA: Diagnosis not present

## 2022-07-16 DIAGNOSIS — J9600 Acute respiratory failure, unspecified whether with hypoxia or hypercapnia: Secondary | ICD-10-CM | POA: Diagnosis not present

## 2022-07-16 DIAGNOSIS — J45909 Unspecified asthma, uncomplicated: Secondary | ICD-10-CM | POA: Diagnosis not present

## 2022-07-18 ENCOUNTER — Ambulatory Visit (HOSPITAL_COMMUNITY)
Admission: RE | Admit: 2022-07-18 | Discharge: 2022-07-18 | Disposition: A | Discharge status: Home / Self Care | Payer: Medicare Other | Source: Ambulatory Visit | Attending: Acute Care | Admitting: Acute Care

## 2022-07-18 DIAGNOSIS — Z430 Encounter for attention to tracheostomy: Secondary | ICD-10-CM | POA: Insufficient documentation

## 2022-07-18 DIAGNOSIS — J45909 Unspecified asthma, uncomplicated: Secondary | ICD-10-CM | POA: Diagnosis not present

## 2022-07-18 DIAGNOSIS — J383 Other diseases of vocal cords: Secondary | ICD-10-CM | POA: Insufficient documentation

## 2022-07-18 DIAGNOSIS — Z87891 Personal history of nicotine dependence: Secondary | ICD-10-CM | POA: Insufficient documentation

## 2022-07-18 DIAGNOSIS — I10 Essential (primary) hypertension: Secondary | ICD-10-CM | POA: Diagnosis not present

## 2022-07-18 DIAGNOSIS — Z930 Tracheostomy status: Secondary | ICD-10-CM | POA: Diagnosis not present

## 2022-07-18 NOTE — Progress Notes (Signed)
  Subjective:  Patient ID: Maureen Jones, female    DOB: 02-18-1966,  MRN: [id number]  Chief Complaint  Patient presents with   Diabetes     (np)  Pressure injury of toe of left foot, pre-ulcerative callus. Patient normally trims her own toenails and debrides the callus area. She was in the hospital in the fall last year and has a trach.    56 y.o. female presents with the above complaint. History confirmed with patient.  Also has itchy scaly skin on top of the foot  Objective:  Physical Exam: warm, good capillary refill, no trophic changes or ulcerative lesions, normal DP and PT pulses, and lichen simplex chronicus dorsal foot bilateral, left hallux IPJ plantar preulcerative callus.   Assessment:   1. Lichen simplex chronicus   2. Callus of foot   3. Type 2 diabetes mellitus with diabetic polyneuropathy, with long-term current use of insulin ([place])      Plan:  Patient was evaluated and treated and all questions answered.  Patient educated on diabetes. Discussed proper diabetic foot care and discussed risks and complications of disease. Educated patient in depth on reasons to return to the office immediately should he/she discover anything concerning or new on the feet. All questions answered. Discussed proper shoes as well.   All symptomatic hyperkeratoses were safely debrided with a sterile #15 blade to patient's level of comfort without incident. We discussed preventative and palliative care of these lesions including supportive and accommodative shoegear, padding, prefabricated and custom molded accommodative orthoses, use of a pumice stone and lotions/creams daily.  Lidex Rx sent to pharmacy for the lichen simplex chronicus on both feet to use as needed  Return if symptoms worsen or fail to improve.

## 2022-07-18 NOTE — Progress Notes (Signed)
Tracheostomy Procedure Note  Maureen Jones [id number] Apr 03, 1966  Pre Procedure Tracheostomy Information  Trach Brand: Shiley Size:  6.0  XLTUP Style: Uncuffed Secured by: Velcro   Procedure: Trach change and Trach Cleaning    Post Procedure Tracheostomy Information  Trach Brand: Shiley Size:  6.0 XLTUP Style: Uncuffed Secured by: Velcro   Post Procedure Evaluation:  ETCO2 positive color change from yellow to purple : Yes.   Vital signs:VSS Patients current condition: stable Complications: No apparent complications Trach site exam: clean, dry Wound care done: 4 x 4 gauze drain Patient did tolerate procedure well.   Education: none  Prescription needs: More trach cleaning kits    Additional needs: none

## 2022-07-18 NOTE — Progress Notes (Signed)
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  Has been in and out of the hospital since tracheostomy placed secondary to intermittent tracheal plugging, dislodgment, as well as her other multiple medical comorbidities such as hypertension, most recently hypertensive crisis. Last seen 6/26 happy to see no recent ER visits. Presents today for planned change   Review of systems Review of Systems  Constitutional:  Negative for chills, fever and malaise/fatigue.  HENT:  Negative for congestion.   Eyes: Negative.   Respiratory:  Negative for cough.   Cardiovascular: Negative.   Gastrointestinal:  Negative for heartburn.  Genitourinary: Negative.   Musculoskeletal: Negative.   Skin: Negative.   Endo/Heme/Allergies: Negative.      Physical exam General 56 year old female presents today in no distress HENT NCAT no JVD skin intact. Does continue to have granulation tissue around the trach primarily at 6 and 9 o'clock in relation to clock dial. This has not increased but is still present Pulm clear  Card rrr Abd soft  Ext warm and dry  Neuro intact    Procedure Trach removed. Site inspected. New 6 prox XLT placed over obturator. 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  She has ENT eval at the end of this month.   Plan Cont routine trach care ROV 10-12 weeks for planned change ENT eval end of month   My time 14 min  Erick Colace ACNP-BC [place] Pager # [phone] OR # [phone] if no answer   Erick Colace ACNP-BC [place] Pager # [phone] OR # [phone] if no answer

## 2022-07-22 ENCOUNTER — Encounter: Payer: Self-pay | Admitting: Nurse Practitioner

## 2022-07-22 ENCOUNTER — Ambulatory Visit (INDEPENDENT_AMBULATORY_CARE_PROVIDER_SITE_OTHER): Payer: Medicare Other | Admitting: Nurse Practitioner

## 2022-07-22 VITALS — BP 180/92 | HR 77 | Temp 96.9°F | Ht 66.0 in | Wt 225.6 lb

## 2022-07-22 DIAGNOSIS — F5101 Primary insomnia: Secondary | ICD-10-CM

## 2022-07-22 DIAGNOSIS — I10 Essential (primary) hypertension: Secondary | ICD-10-CM

## 2022-07-22 DIAGNOSIS — Z794 Long term (current) use of insulin: Secondary | ICD-10-CM

## 2022-07-22 DIAGNOSIS — E1165 Type 2 diabetes mellitus with hyperglycemia: Secondary | ICD-10-CM | POA: Diagnosis not present

## 2022-07-22 DIAGNOSIS — E1142 Type 2 diabetes mellitus with diabetic polyneuropathy: Secondary | ICD-10-CM | POA: Diagnosis not present

## 2022-07-22 DIAGNOSIS — I1A0 Resistant hypertension: Secondary | ICD-10-CM

## 2022-07-22 MED ORDER — TRAZODONE HCL 50 MG PO TABS: 50.0000 mg | ORAL_TABLET | Freq: Every day | ORAL | 5 refills | Status: DC

## 2022-07-22 NOTE — Assessment & Plan Note (Addendum)
Home glucose: 120-160 Hypoglycemia episodes: 68-74 due to skipping meals. Advised to get glucose tabs, and how to manage hypoglycemic episodes. Advised to avoid skipping meals.  provided new sliding scale for humalog Maintain tresiba dose 32units BID F/up in 87month

## 2022-07-22 NOTE — Patient Instructions (Addendum)
Insulin (Novolog) sliding scale for fast acting insulin (Novolog or Humalog): administer 40minutes before meals. Sugar >150 - no units Sugar >151-200 - 2 units Sugar >200-250 - 6 units Sugar >251-300 - 8 units and call office Sugar >350 - 10 units and call office.   Continue tresiba 32units twice a day Avoid skipping meals.  Maintain upcoming appt with cardiology Need to be consistent with medication doses in order to get BP under control.  Preventing Hypoglycemia Hypoglycemia occurs when the level of sugar (glucose) in the blood is too low. Hypoglycemia can happen in people who do or do not have diabetes (diabetes mellitus). It can develop quickly, and it can be a medical emergency. For most people with diabetes, a blood glucose level below 70 mg/dL (3.9 mmol/L) is considered hypoglycemia. Glucose is a type of sugar that provides the body's main source of energy. Certain hormones (insulin and glucagon) control the level of glucose in the blood. Insulin lowers blood glucose, and glucagon increases blood glucose. Hypoglycemia can result from having too much insulin in the bloodstream, or from not eating enough food that contains glucose. Your risk for hypoglycemia is higher: If you take insulin or diabetes medicines to help lower your blood glucose or to help your body make more insulin. If you skip or delay a meal or snack. If you are ill. During and after exercise. You can prevent hypoglycemia by working with your health care provider to adjust your meal plan as needed and by taking other precautions. How can hypoglycemia affect me? Mild symptoms Mild hypoglycemia may not cause any symptoms. If you do have symptoms, they may include: Hunger. Sweating and feeling clammy. Dizziness or feeling light-headed. Sleepiness or restless sleep. Nausea. Increased heart rate. Headache. Blurry vision. Mood changes, including irritability or anxiety. Tingling or numbness around the mouth, lips, or  tongue. If mild hypoglycemia is not recognized and treated, it can quickly become moderate or severe hypoglycemia. Moderate symptoms Moderate hypoglycemia can cause: Confusion and poor judgment. Behavior changes. Weakness. Irregular heartbeat. A change in coordination. Severe symptoms Severe hypoglycemia is a medical emergency. It can cause: Fainting. Seizures. Loss of consciousness (coma). Death. What nutrition changes can be made? Work with your health care provider or dietitian to make a healthy meal plan that is right for you. Follow your meal plan carefully. Eat meals at regular times. If recommended by your health care provider, have snacks between meals. Donot skip or delay meals or snacks. You can be at risk for hypoglycemia if you are not getting enough carbohydrates. What lifestyle changes can be made?  Work closely with your health care provider to manage your blood glucose. Make sure you know: Your goal blood glucose levels. How and when to check your blood glucose. The symptoms of hypoglycemia. It is important to treat hypoglycemia right away to keep it from becoming severe. Do not drink alcohol on an empty stomach. When you are ill, check your blood glucose more often than usual. Make a sick day plan in advance with your health care provider. Follow this plan whenever you cannot eat or drink normally. Always check your blood glucose before, during, and after exercise. How is this treated? This condition can often be treated by immediately eating or drinking something that contains sugar with 15 grams of fast-acting carbohydrate, such as: 4 oz (120 mL) of fruit juice. 4 oz (120 mL) of regular soda (not diet soda). Several pieces of hard candy. Check food labels to find out how many  pieces to eat for 15 grams. 1 Tbsp (15 mL) of sugar or honey. 4 glucose tablets. 1 tube of glucose gel. Treating hypoglycemia if you have diabetes If you are alert and able to swallow  safely, follow the 15:15 rule: Take 15 grams of a fast-acting carbohydrate. Talk with your health care provider about how much you should take. Fast-acting options include: Glucose tablets (take 4 tablets). Several pieces of hard candy. Check food labels to find out how many pieces to eat for 15 grams. 4 oz (120 mL) of fruit juice. 4 oz (120 mL) of regular soda (not diet soda). 1 Tbsp (15 mL) of sugar or honey. 1 tube of glucose gel. Check your blood glucose 15 minutes after you take the carbohydrate. If the repeat blood glucose level is still at or below 70 mg/dL (3.9 mmol/L), take 15 grams of a carbohydrate again. If your blood glucose level does not increase above 70 mg/dL (3.9 mmol/L) after 3 tries, seek emergency medical care. After your blood glucose level returns to normal, eat a meal or a snack within 1 hour. Treating severe hypoglycemia Severe hypoglycemia is when your blood glucose level is below 54 mg/dL (3 mmol/L). Severe hypoglycemia is a medical emergency. Get medical help right away. If you have severe hypoglycemia and you cannot eat or drink, you may need glucagon. A family member or close friend should learn how to check your blood glucose and how to give you glucagon. Ask your health care provider if you need to have an emergency glucagon kit available. Severe hypoglycemia may need to be treated in a hospital. The treatment may include getting glucose through an IV. You may also need treatment for the cause of your hypoglycemia. Where to find more information American Diabetes Association: www.diabetes.[employer] of Diabetes and Digestive and Kidney Diseases: [employer] Association of Diabetes Care & Education Specialists: www.diabeteseducator.org Contact a health care provider if: You have problems keeping your blood glucose in your target range. You have frequent episodes of hypoglycemia. Get help right away if: You continue to have hypoglycemia  symptoms after eating or drinking something containing glucose. Your blood glucose level is below 54 mg/dL (3 mmol/L). You faint. You have a seizure. These symptoms may represent a serious problem that is an emergency. Do not wait to see if the symptoms will go away. Get medical help right away. Call your local emergency services (911 in the [place] Do not drive yourself to the hospital. Summary Know the symptoms of hypoglycemia and when you are at risk for it, such as during exercise or when you are sick. Check your blood glucose often when you are at risk for hypoglycemia. Hypoglycemia can develop quickly, and it can be dangerous if it is not treated right away. If you have a history of severe hypoglycemia, make sure your family or a close friend knows how to use your glucagon kit. Make sure you know how to treat hypoglycemia. Keep a fast-acting carbohydrate option available when you may be at risk for hypoglycemia. This information is not intended to replace advice given to you by your health care provider. Make sure you discuss any questions you have with your health care provider. Document Revised: 10/29/2020 Document Reviewed: 10/29/2020 Elsevier Patient Education  [employer].

## 2022-07-22 NOTE — Assessment & Plan Note (Signed)
Admits inconsistency with medication dose BP not at goal Has appt with hypertensive clinic 08/18/22 Pending appt with nephrology. Advised took take medications as prescribed

## 2022-07-22 NOTE — Assessment & Plan Note (Signed)
Chronic, no improvement with melatonin and benadryl. She sleep in daytime and awake at night. Advised about behavioral modification Provided trazodone at hs.

## 2022-07-22 NOTE — Progress Notes (Signed)
Established Patient Visit  Patient: Maureen Jones   DOB: September 02, 1966   56 y.o. Female  MRN: [id number] Visit Date: 07/22/2022  Subjective:    Chief Complaint  Patient presents with   Office Visit    F/u from 7/24 visit C/o of dull pain & knot on left palm     HPI Resistant hypertension Admits inconsistency with medication dose BP not at goal Has appt with hypertensive clinic 08/18/22 Pending appt with nephrology. Advised took take medications as prescribed  DM (diabetes mellitus) ([place]) Home glucose: 120-160 Hypoglycemia episodes: 68-74 due to skipping meals. Advised to get glucose tabs, and how to manage hypoglycemic episodes. Advised to avoid skipping meals.  provided new sliding scale for humalog Maintain tresiba dose 32units BID F/up in 29month   Insomnia Chronic, no improvement with melatonin and benadryl. She sleep in daytime and awake at night. Advised about behavioral modification Provided trazodone at hs.   Reviewed medical, surgical, and social history today  Medications: Outpatient Medications Prior to Visit  Medication Sig   amLODipine (NORVASC) 10 MG tablet Take 1 tablet (10 mg total) by mouth at bedtime.   atorvastatin (LIPITOR) 20 MG tablet Take 1 tablet (20 mg total) by mouth daily.   azelastine (ASTELIN) 0.1 % nasal spray Place 2 sprays into both nostrils 2 (two) times daily. Use in each nostril as directed   carvedilol (COREG) 25 MG tablet Take 1 tablet (25 mg total) by mouth 2 (two) times daily.   cloNIDine (CATAPRES) 0.1 MG tablet Take 1 tablet (0.1 mg total) by mouth 2 (two) times daily.   Continuous Blood Gluc Sensor (FREESTYLE LIBRE 14 DAY SENSOR) MISC 1 Units by Does not apply route every 14 (fourteen) days.   DULoxetine (CYMBALTA) 60 MG capsule Take 1 capsule (60 mg total) by mouth daily.   fluticasone (FLONASE) 50 MCG/ACT nasal spray Place 2 sprays into both nostrils daily.   glucose blood (ONETOUCH VERIO) test strip Use as  instructed   hydrALAZINE (APRESOLINE) 100 MG tablet Take 1 tablet (100 mg total) by mouth 3 (three) times daily.   Insulin Pen Needle 32G X 4 MM MISC Use to inject insulin up to 4 times daily as directed   levocetirizine (XYZAL) 5 MG tablet Take 1 tablet (5 mg total) by mouth every evening.   losartan (COZAAR) 50 MG tablet Take 1 tablet (50 mg total) by mouth daily.   promethazine (PHENERGAN) 25 MG tablet Take 1 tablet (25 mg total) by mouth every 6 (six) hours as needed for nausea or vomiting.   spironolactone (ALDACTONE) 25 MG tablet Take 0.5 tablets (12.5 mg total) by mouth daily.   TRESIBA FLEXTOUCH 200 UNIT/ML FlexTouch Pen Inject 32 Units into the skin 2 (two) times daily.   triamcinolone cream (KENALOG) 0.1 % Apply 1 Application topically 2 (two) times daily.   [DISCONTINUED] clonazePAM (KLONOPIN) 0.5 MG tablet Take 1/2 tablet (0.25 mg total) by mouth daily at bedtime.   [DISCONTINUED] insulin lispro (HUMALOG) 100 UNIT/ML KwikPen Inject 16 Units into the skin 3 (three) times daily with meals.   [DISCONTINUED] loratadine (CLARITIN REDITABS) 10 MG dissolvable tablet Take 1 tablet (10 mg total) by mouth daily.   insulin lispro (HUMALOG) 100 UNIT/ML KwikPen Insulin (Novolog) sliding scale for fast acting insulin (Novolog or Humalog): administer 27minutes before meals. Sugar >150 - no units Sugar >151-200 - 2 units Sugar >200-250 - 6 units Sugar >251-300 - 8 units  and call office Sugar >350 - 10 units and call office.   loperamide (IMODIUM) 2 MG capsule Take 1 capsule (2 mg total) by mouth every 4 (four) hours as needed for diarrhea or loose stools. (Patient not taking: Reported on 07/22/2022)   pantoprazole (PROTONIX) 40 MG tablet Take 1 tablet (40 mg total) by mouth 2 (two) times daily. Take twice a day for 1 month, then go back to daily   [DISCONTINUED] albuterol (PROVENTIL) (2.5 MG/3ML) 0.083% nebulizer solution INHALE 3 ML BY NEBULIZATION EVERY 6 HOURS AS NEEDED FOR WHEEZING OR SHORTNESS OF  BREATH (Patient not taking: Reported on 07/22/2022)   [DISCONTINUED] budesonide (PULMICORT) 0.25 MG/2ML nebulizer solution Use 1 vial (0.25 mg total) by nebulization 2 (two) times daily. (Patient not taking: Reported on 07/22/2022)   No facility-administered medications prior to visit.   Reviewed past medical and social history.   ROS per HPI above      Objective:  BP (!) 180/92 (BP Location: Left Arm, Patient Position: Sitting, Cuff Size: Normal)   Pulse 77   Temp (!) 96.9 F (36.1 C) (Temporal)   Ht 5\' 6"  (1.676 m)   Wt 225 lb 9.6 oz (102.3 kg)   SpO2 99%   BMI 36.41 kg/m      Physical Exam Cardiovascular:     Rate and Rhythm: Normal rate.     Pulses: Normal pulses.  Pulmonary:     Effort: Pulmonary effort is normal.  Neurological:     Mental Status: She is alert and oriented to person, place, and time.  Psychiatric:        Mood and Affect: Mood normal.        Behavior: Behavior normal.        Thought Content: Thought content normal.     No results found for any visits on 07/22/22.    Assessment & Plan:    Problem List Items Addressed This Visit       Cardiovascular and Mediastinum   Resistant hypertension - Primary    Admits inconsistency with medication dose BP not at goal Has appt with hypertensive clinic 08/18/22 Pending appt with nephrology. Advised took take medications as prescribed        Endocrine   DM (diabetes mellitus) ([place])    Home glucose: 120-160 Hypoglycemia episodes: 68-74 due to skipping meals. Advised to get glucose tabs, and how to manage hypoglycemic episodes. Advised to avoid skipping meals.  provided new sliding scale for humalog Maintain tresiba dose 32units BID F/up in 72month       Relevant Medications   insulin lispro (HUMALOG) 100 UNIT/ML KwikPen     Other   Insomnia    Chronic, no improvement with melatonin and benadryl. She sleep in daytime and awake at night. Advised about behavioral modification Provided trazodone  at hs.      Relevant Medications   traZODone (DESYREL) 50 MG tablet   Morbid (severe) obesity due to excess calories ([facility])   Relevant Medications   insulin lispro (HUMALOG) 100 UNIT/ML KwikPen   Return in about 4 weeks (around 08/19/2022) for DM, HTN, insomnia.     Wilfred Lacy, NP

## 2022-07-26 DIAGNOSIS — Z930 Tracheostomy status: Secondary | ICD-10-CM | POA: Diagnosis not present

## 2022-07-28 DIAGNOSIS — J383 Other diseases of vocal cords: Secondary | ICD-10-CM | POA: Diagnosis not present

## 2022-07-28 DIAGNOSIS — J45909 Unspecified asthma, uncomplicated: Secondary | ICD-10-CM | POA: Diagnosis not present

## 2022-08-05 IMAGING — DX DG CHEST 1V PORT
1 series · 1 of 1 positions shown · non-contrast
Comparison: January 14, 2022

CLINICAL DATA: cough

EXAM:
PORTABLE CHEST 1 VIEW

[chest]
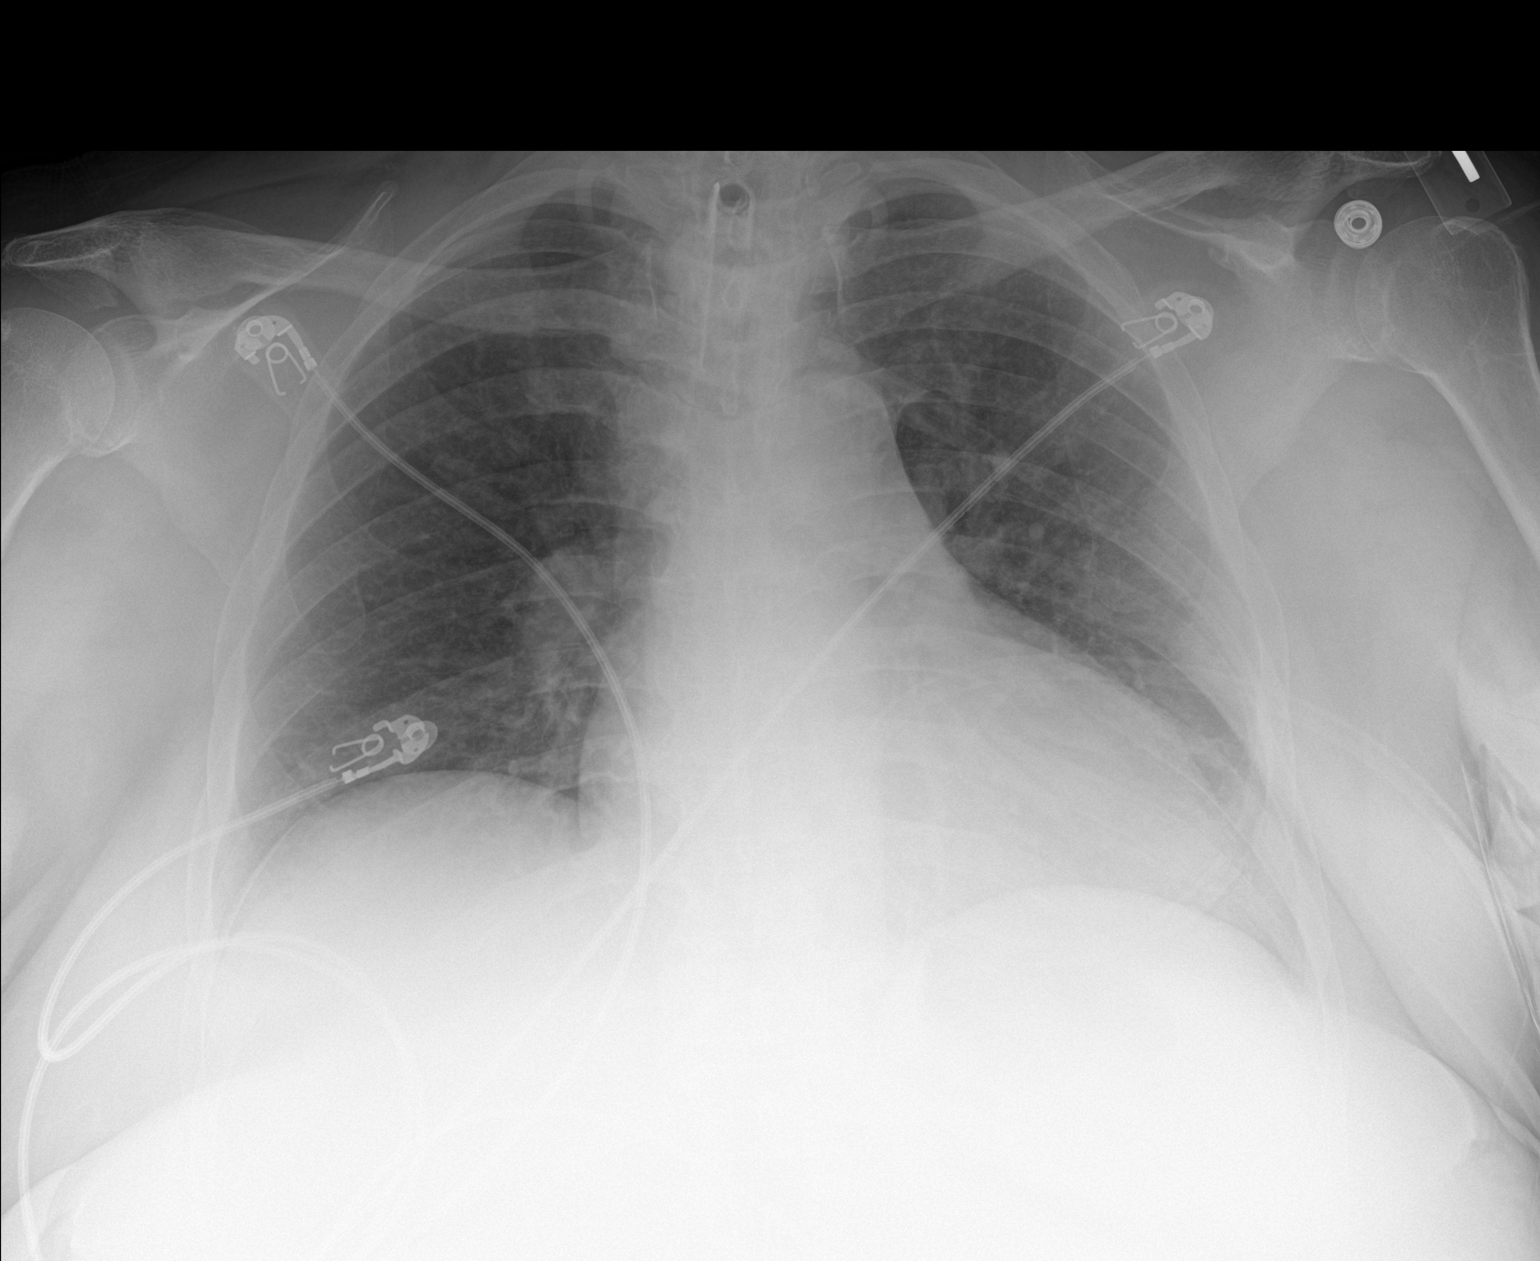

[1 of 1 positions shown; findings below may reference images not displayed]

FINDINGS: The cardiomediastinal silhouette is unchanged in
contour.Tracheostomy. No pleural effusion. No pneumothorax. Low lung
volumes. No acute pleuroparenchymal abnormality. Visualized abdomen
is unremarkable.
IMPRESSION: No acute cardiopulmonary abnormality.

## 2022-08-05 IMAGING — CT CT ABD-PELV W/O CM
2 of 4 series · 15 of 46 positions shown, 17 images · non-contrast
Comparison: None.

CLINICAL DATA: Acute abdominal pain.  Vomiting and diarrhea.



[Series 3: a/p w/o 5mm · axial · non-contrast · 0.98mm/px · z∈[+774,+1204]mm · 12 of 96 slices shown, 14 images]
[im 5/96  soft-tissue]
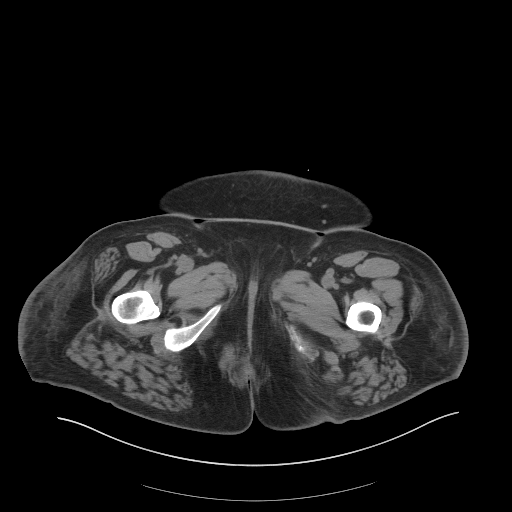
[im 5/96  bone]
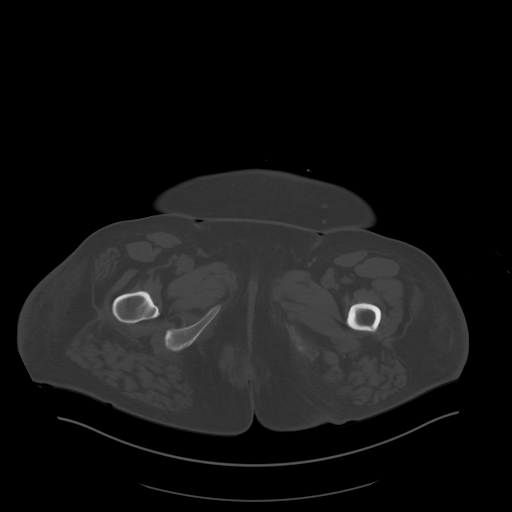
[im 13/96  soft-tissue]
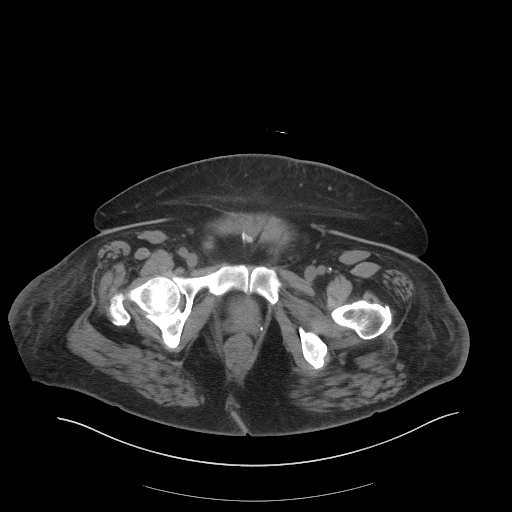
[im 22/96  soft-tissue]
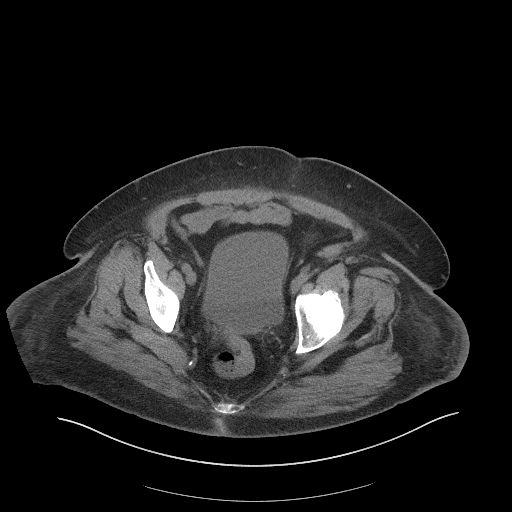
[im 31/96  soft-tissue]
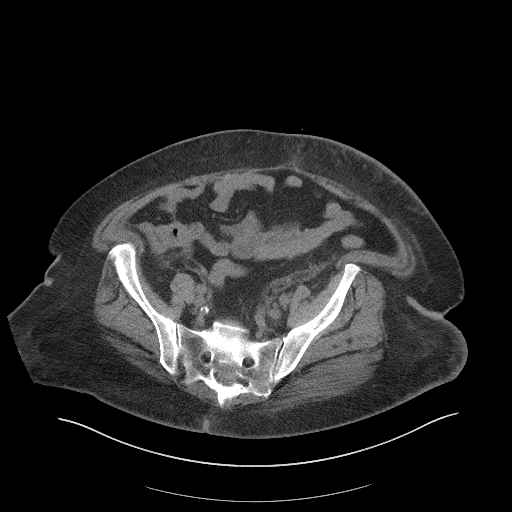
[im 35/96  soft-tissue]
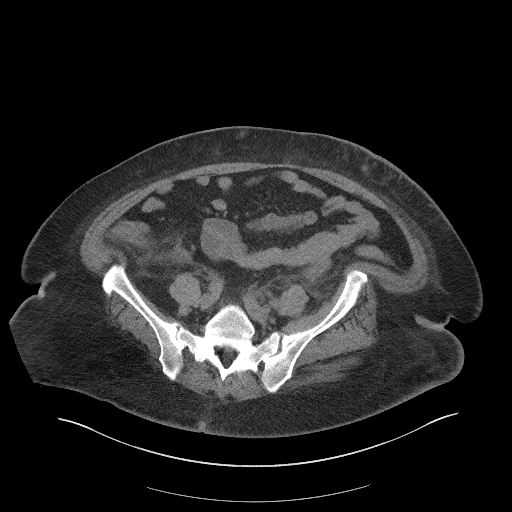
[im 44/96  soft-tissue]
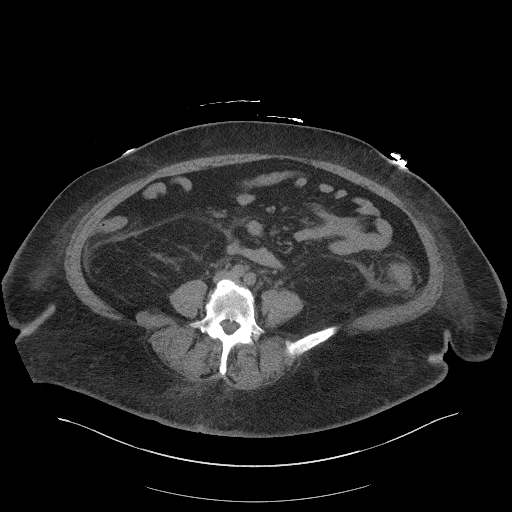
[im 52/96  soft-tissue]
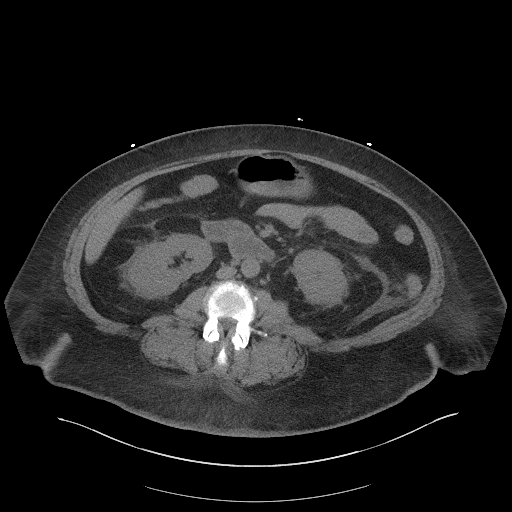
[im 61/96  soft-tissue]
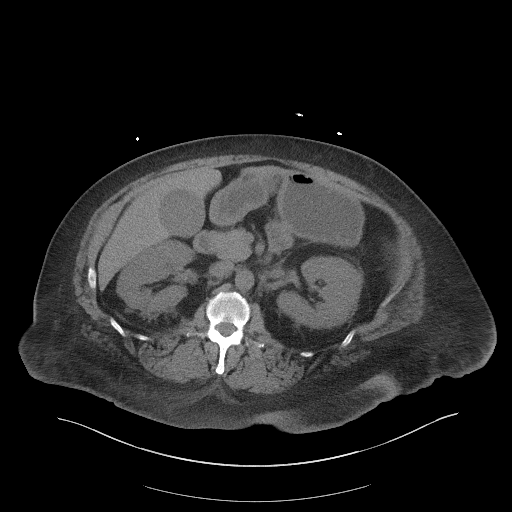
[im 65/96  soft-tissue]
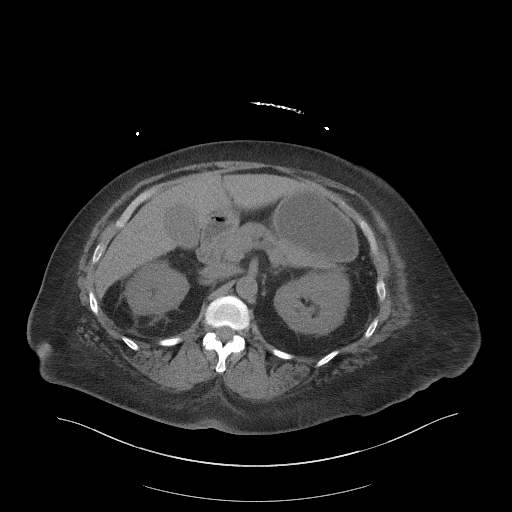
[im 65/96  bone]
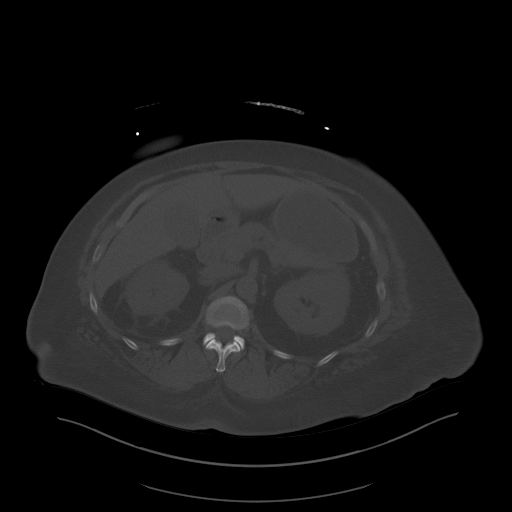
[im 74/96  soft-tissue]
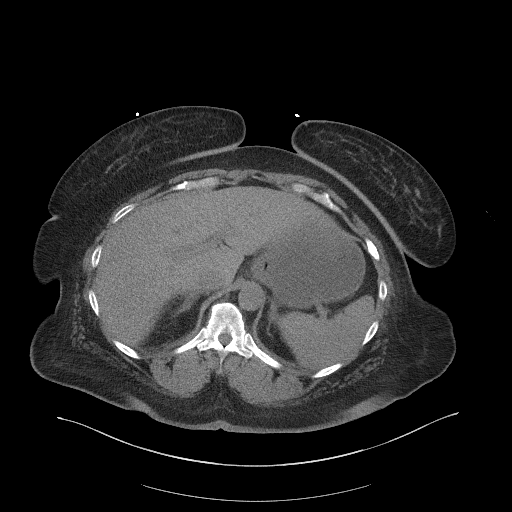
[im 83/96  soft-tissue]
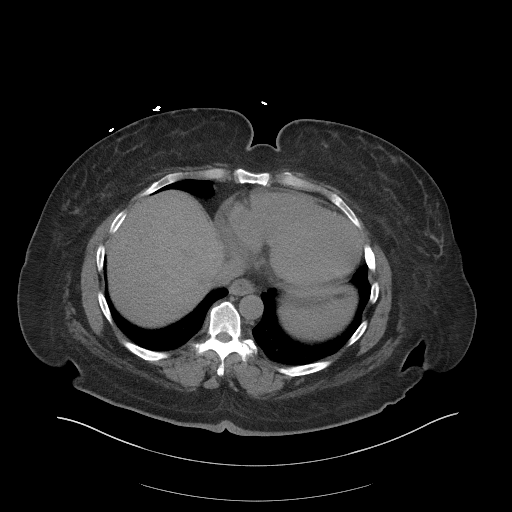
[im 91/96  soft-tissue]
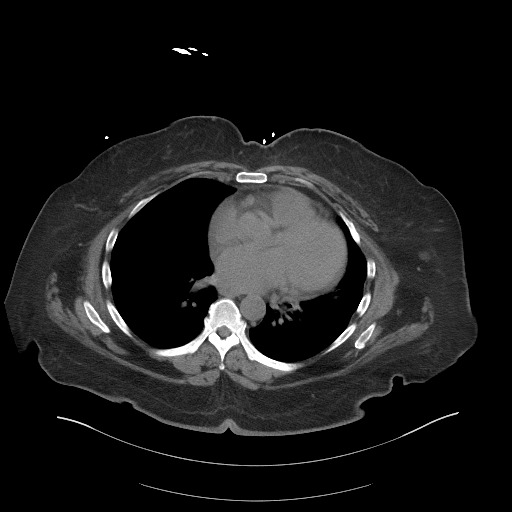

[Series 6: a/p w/o cor · coronal · non-contrast · 0.93mm/px · 3 of 177 slices shown]
[im 59/177  soft-tissue]
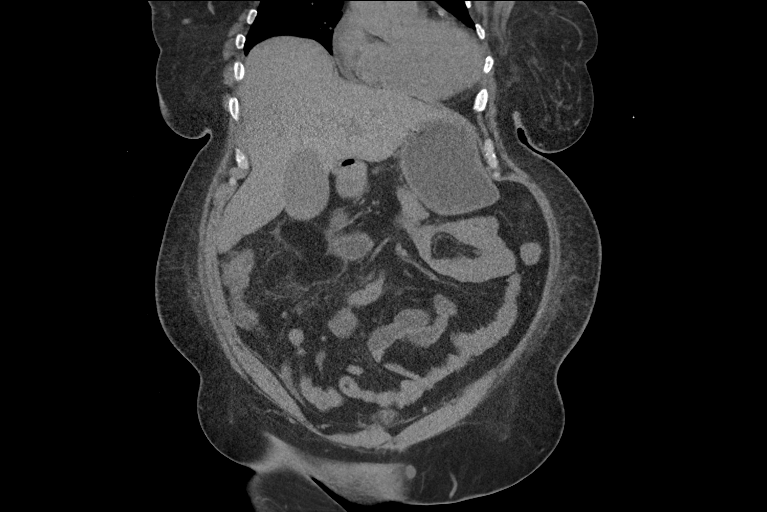
[im 79/177  soft-tissue]
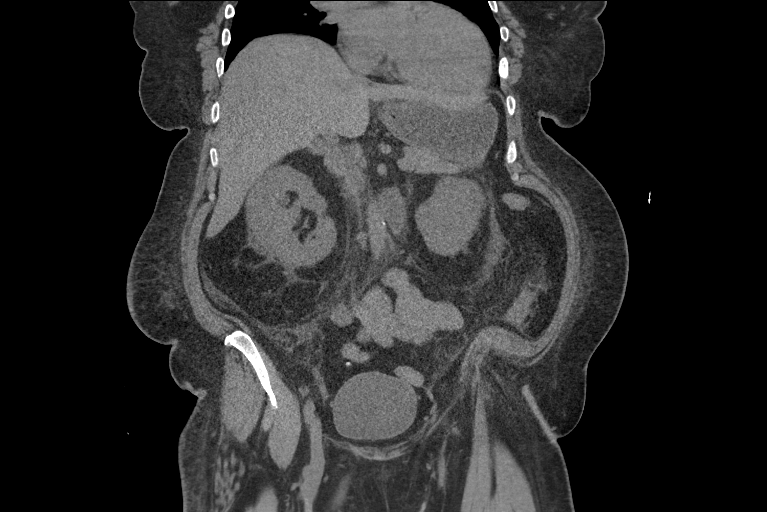
[im 98/177  soft-tissue]
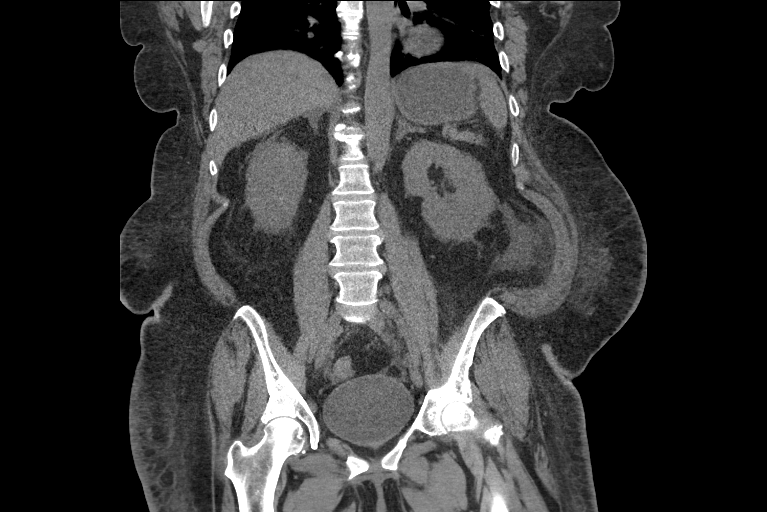

[15 of 46 positions shown; findings below may reference images not displayed]

FINDINGS: Lower chest: No acute abnormality.

Hepatobiliary: No focal liver abnormality is seen. No gallstones,
gallbladder wall thickening, or biliary dilatation.

Pancreas: Unremarkable. No pancreatic ductal dilatation or
surrounding inflammatory changes.

Spleen: Normal in size without focal abnormality.

Adrenals/Urinary Tract: There is no evidence for hydronephrosis or
urinary tract calculus. Kidneys are normal in size. There is
bilateral periureteral and perinephric stranding. Bladder is within
normal limits. Adrenal glands are within normal limits.

Stomach/Bowel: Stomach is within normal limits. Appendix appears
normal. No evidence for bowel obstruction or free air. There is
stranding and fluid surrounding the descending colon concerning for
colitis.

Vascular/Lymphatic: No significant vascular findings are present. No
enlarged abdominal or pelvic lymph nodes.

Reproductive: Status post hysterectomy. No adnexal masses.

Other: There is mild stranding and fluid tracking along the
bilateral retroperitoneal regions. There is no free air or focal
abdominal wall hernia. There is mild body wall edema.

Musculoskeletal: No acute or significant osseous findings.
IMPRESSION: 1. Bilateral perinephric and Peri ureteral stranding and fluid may
be related to inflammatory or infectious process. No urinary tract
calculus or hydronephrosis.
2. Fluid and stranding surrounding the descending colon concerning
for nonspecific colitis.
3. Mild body wall edema.

## 2022-08-09 DIAGNOSIS — K219 Gastro-esophageal reflux disease without esophagitis: Secondary | ICD-10-CM | POA: Diagnosis present

## 2022-08-09 DIAGNOSIS — Z930 Tracheostomy status: Secondary | ICD-10-CM | POA: Diagnosis not present

## 2022-08-09 DIAGNOSIS — J302 Other seasonal allergic rhinitis: Secondary | ICD-10-CM | POA: Diagnosis not present

## 2022-08-09 DIAGNOSIS — J383 Other diseases of vocal cords: Secondary | ICD-10-CM | POA: Diagnosis not present

## 2022-08-09 DIAGNOSIS — J386 Stenosis of larynx: Secondary | ICD-10-CM | POA: Insufficient documentation

## 2022-08-11 ENCOUNTER — Other Ambulatory Visit: Payer: Self-pay | Admitting: *Deleted

## 2022-08-11 MED ORDER — PANTOPRAZOLE SODIUM 40 MG PO TBEC
40.0000 mg | DELAYED_RELEASE_TABLET | Freq: Two times a day (BID) | ORAL | 2 refills | Status: DC
Start: 2022-08-11 — End: 2022-11-14

## 2022-08-12 DIAGNOSIS — G4733 Obstructive sleep apnea (adult) (pediatric): Secondary | ICD-10-CM | POA: Diagnosis not present

## 2022-08-16 DIAGNOSIS — J9600 Acute respiratory failure, unspecified whether with hypoxia or hypercapnia: Secondary | ICD-10-CM | POA: Diagnosis not present

## 2022-08-16 DIAGNOSIS — Z930 Tracheostomy status: Secondary | ICD-10-CM | POA: Diagnosis not present

## 2022-08-16 DIAGNOSIS — J45909 Unspecified asthma, uncomplicated: Secondary | ICD-10-CM | POA: Diagnosis not present

## 2022-08-18 ENCOUNTER — Ambulatory Visit (HOSPITAL_BASED_OUTPATIENT_CLINIC_OR_DEPARTMENT_OTHER): Payer: Medicare Other | Admitting: Family

## 2022-08-19 ENCOUNTER — Ambulatory Visit: Payer: Medicare Other | Admitting: Nurse Practitioner

## 2022-08-23 ENCOUNTER — Telehealth: Payer: Self-pay | Admitting: *Deleted

## 2022-08-23 NOTE — Chronic Care Management (AMB) (Signed)
  Chronic Care Management   Outreach Note  08/23/2022 Name: Maureen Jones MRN: [id number] DOB: [birth year]  Maureen Jones is a 56 y.o. year old female who is a primary care patient of Nche, Charlene Brooke, NP. I reached out to [name] by phone today in response to a referral sent by Ms. Hetvi Levings's primary care provider.  An unsuccessful telephone outreach was attempted today. The patient was referred to the case management team for assistance with care management and care coordination.   Follow Up Plan: The care management team will reach out to the patient again over the next 7 days.  If patient returns call to provider office, please advise to call [place]* at [phone].*  [place]  Direct Dial: [phone]

## 2022-08-24 ENCOUNTER — Telehealth (HOSPITAL_COMMUNITY): Payer: Self-pay | Admitting: Licensed Clinical Social Worker

## 2022-08-24 ENCOUNTER — Ambulatory Visit (INDEPENDENT_AMBULATORY_CARE_PROVIDER_SITE_OTHER): Payer: Self-pay | Admitting: Licensed Clinical Social Worker

## 2022-08-24 DIAGNOSIS — Z91199 Patient's noncompliance with other medical treatment and regimen due to unspecified reason: Secondary | ICD-10-CM

## 2022-08-24 NOTE — Telephone Encounter (Signed)
Patient ID: Maureen Jones, female   DOB: [birth year], 57 y.o.   MRN: [id number]   LCSW counselor attempted to connect with patient. Pt never showed up in the office at appointed time. LCSW clinician also attempted to connect via phone without success. LCSW counselor left message for patient to call office number to reschedule OPT appointment.   Phone call attempted at 10:20--pt was given 20 minutes to show for 10:00 appointment.  Visit will be coded as no show

## 2022-08-24 NOTE — Progress Notes (Signed)
Patient ID: Maureen Jones, female   DOB: Mar 04, 1966, 56 y.o.   MRN: [id number]   LCSW counselor attempted to connect with patient. Pt never showed up in the office at appointed time. LCSW clinician also attempted to connect via phone without success. LCSW counselor left message for patient to call office number to reschedule OPT appointment.   Phone call attempted at 10:20--pt was given 20 minutes to show for 10:00 appointment.  Visit will be coded as no show

## 2022-08-28 DIAGNOSIS — J383 Other diseases of vocal cords: Secondary | ICD-10-CM | POA: Diagnosis not present

## 2022-08-28 DIAGNOSIS — J45909 Unspecified asthma, uncomplicated: Secondary | ICD-10-CM | POA: Diagnosis not present

## 2022-08-30 DIAGNOSIS — Z930 Tracheostomy status: Secondary | ICD-10-CM | POA: Diagnosis not present

## 2022-08-30 NOTE — Chronic Care Management (AMB) (Signed)
  Chronic Care Management   Outreach Note  08/30/2022 Name: Maureen Jones MRN: [id number] DOB: November 13, 1966  Maureen Jones is a 56 y.o. year old female who is a primary care patient of Nche, Charlene Brooke, NP. I reached out to Maureen Jones by phone today in response to a referral sent by Ms. Tsuyako Arganbright's primary care provider.  A second unsuccessful telephone outreach was attempted today. The patient was referred to the case management team for assistance with care management and care coordination.   Follow Up Plan: A HIPAA compliant phone message was left for the patient providing contact information and requesting a return call.  If patient returns call to provider office, please advise to call [place]* at [phone].*  [place]  Direct Dial: [phone]

## 2022-09-02 ENCOUNTER — Telehealth: Payer: Self-pay | Admitting: *Deleted

## 2022-09-02 NOTE — Chronic Care Management (AMB) (Signed)
  Chronic Care Management   Note  09/02/2022 Name: Chemere Steffler MRN: [id number] DOB: July 06, 1966  Maureen Jones is a 56 y.o. year old female who is a primary care patient of Nche, Charlene Brooke, NP. I reached out to [name] by phone today in response to a referral sent by Ms. Katheleen Whitelock's PCP.  Ms. Kutsch was given information about Chronic Care Management services today including:  CCM service includes personalized support from designated clinical staff supervised by her physician, including individualized plan of care and coordination with other care providers 24/7 contact phone numbers for assistance for urgent and routine care needs. Service will only be billed when office clinical staff spend 20 minutes or more in a month to coordinate care. Only one practitioner may furnish and bill the service in a calendar month. The patient may stop CCM services at any time (effective at the end of the month) by phone call to the office staff. The patient is responsible for co-pay (up to 20% after annual deductible is met) if co-pay is required by the individual health plan.   Patient agreed to services and verbal consent obtained.   Follow up plan: Telephone appointment with care management team member scheduled for:09/15/22  [place]  Direct Dial: [phone]

## 2022-09-02 NOTE — Chronic Care Management (AMB) (Signed)
  Care Coordination   Note   09/02/2022 Name: Maureen Jones MRN: [id number] DOB: 09/29/1966  Maureen Jones is a 56 y.o. year old female who sees Nche, Charlene Brooke, NP for primary care. I reached out to [name] by phone today to offer care coordination services.  [name] was given information about Care Coordination services today including:   The Care Coordination services include support from the care team which includes your Nurse Coordinator, Clinical Social Worker, or Pharmacist.  The Care Coordination team is here to help remove barriers to the health concerns and goals most important to you. Care Coordination services are voluntary, and the patient may decline or stop services at any time by request to their care team member.   Care Coordination Consent Status: Patient agreed to services and verbal consent obtained.   Follow up plan:  Telephone appointment with care coordination team member scheduled for:  09/12/22  Encounter Outcome:  Pt. Scheduled  [place]  Direct Dial: [phone]

## 2022-09-07 ENCOUNTER — Encounter: Payer: Self-pay | Admitting: Cardiovascular Disease

## 2022-09-13 ENCOUNTER — Encounter: Payer: Self-pay | Admitting: *Deleted

## 2022-09-13 ENCOUNTER — Telehealth: Payer: Self-pay

## 2022-09-13 ENCOUNTER — Ambulatory Visit: Payer: Self-pay | Admitting: Licensed Clinical Social Worker

## 2022-09-13 NOTE — Patient Instructions (Signed)
Visit Information  Thank you for taking time to visit with me today. Please don't hesitate to contact me if I can be of assistance to you.   Following are the goals we discussed today:   Goals Addressed             This Visit's Progress    Care Coordination activities- Food and Transportation       SW submitted [facility] Referral for Transportation and Meal delivery.  SW completed SDOH and no further assistance is needed.           Patient verbalizes understanding of instructions and care plan provided today and agrees to view in [facility]. Active MyChart status and patient understanding of how to access instructions and care plan via MyChart confirmed with patient.     No further follow up required: . Lenor Derrick, MSW  Social Worker [facility] Care Management  [phone]

## 2022-09-13 NOTE — Progress Notes (Signed)
Chronic Care Management Pharmacy Assistant   Name: Maureen Jones  MRN: [id number] DOB: Oct 20, 1966  Chart Review for the Clinical pharmacist for 09/15/2022 at 1:45 pm.  Conditions to be addressed/monitored: HTN, DMII, CKD Stage 5, Asthma, Allergic Rhinitis, and OSA, Bilateral Carpal tunnel syndrome, Insomnia, Mood disorder  Primary concerns for visit include: None ID   Recent office visits:  07/22/2022 Wilfred Lacy NP (PCP) Start Trazodone 50 mg nightly, Return in about 4 weeks  07/04/2022 Wilfred Lacy NP (PCP) Decrease spironolactone to 12.5 daily, start clonidine 0.1mg  BID, Ambulatory referral to [facility] ,AMB Referral to [place], Referral to Nutrition and Diabetes Services, Ambulatory referral to Psychology, Ambulatory referral to Neurology, Ambulatory referral to Podiatry, Return in about 2 weeks , referral to endocrinology 04/05/2022 Wilfred Lacy NP (PCP)  start Flonase 50 MCG/ACT 2 spray daily, start levocetirizine (XYZAL) 5 MG tablet daily 04/01/2022 Wilfred Lacy NP (PCP) Start potassium chloride 20 MEQ Daily, start spironolactone 25 mg daily, Decrease pantoprazole to 40 mg daily, Return in about 1 month  Recent consult visits:  08/09/2022 Dr. Sabino Gasser MD (otolaryngology) Increase her daily Flonase and Astelin use to twice daily, referral to [facility],  07/14/2022 Lanae Crumbly DPM (Podiatry) Start triamcinolone cream (KENALOG) 0.1 %  Hospital visits:  Medication Reconciliation was completed by comparing discharge summary, patient's EMR and Pharmacy list, and upon discussion with patient.  Admitted to the hospital on 05/22/2022 due to Shortness of breath. Discharge date was 05/23/2022. Discharged from [place]?Medications Started at [facility] Discharge:?? -started None ID  Medication Changes at Hospital Discharge: -Changed None ID  Medications Discontinued at Hospital  Discharge: -Stopped None ID  Medications that remain the same after Hospital Discharge:??  -All other medications will remain the same.    Admitted to the hospital on 03/31/2022 due to Tracheostomy complication. Discharge date was 03/31/2022. Discharged from [place]?Medications Started at [facility] Discharge:?? -started None ID  Medication Changes at Hospital Discharge: -Changed None ID  Medications Discontinued at Hospital Discharge: -Stopped None ID  Medications that remain the same after Hospital Discharge:??  -All other medications will remain the same.    Questions for Clinical Pharmacist:   Left Voice message to do initial question prior to patient appointment on 09/15/2022 for CCM at 1:45 pm with Junius Argyle the Clinical pharmacist.      Medications: Outpatient Encounter Medications as of 09/13/2022  Medication Sig   amLODipine (NORVASC) 10 MG tablet Take 1 tablet (10 mg total) by mouth at bedtime.   atorvastatin (LIPITOR) 20 MG tablet Take 1 tablet (20 mg total) by mouth daily.   azelastine (ASTELIN) 0.1 % nasal spray Place 2 sprays into both nostrils 2 (two) times daily. Use in each nostril as directed   carvedilol (COREG) 25 MG tablet Take 1 tablet (25 mg total) by mouth 2 (two) times daily.   cloNIDine (CATAPRES) 0.1 MG tablet Take 1 tablet (0.1 mg total) by mouth 2 (two) times daily.   Continuous Blood Gluc Sensor (FREESTYLE LIBRE 14 DAY SENSOR) MISC 1 Units by Does not apply route every 14 (fourteen) days.   DULoxetine (CYMBALTA) 60 MG capsule Take 1 capsule (60 mg total) by mouth daily.   fluticasone (FLONASE) 50 MCG/ACT nasal spray Place 2 sprays into both nostrils daily.   glucose blood (ONETOUCH VERIO) test strip Use as instructed   hydrALAZINE (APRESOLINE) 100 MG tablet Take 1 tablet (100 mg total) by  mouth 3 (three) times daily.   insulin lispro (HUMALOG) 100 UNIT/ML KwikPen Insulin (Novolog) sliding scale for fast acting insulin  (Novolog or Humalog): administer 60minutes before meals. Sugar >150 - no units Sugar >151-200 - 2 units Sugar >200-250 - 6 units Sugar >251-300 - 8 units and call office Sugar >350 - 10 units and call office.   Insulin Pen Needle 32G X 4 MM MISC Use to inject insulin up to 4 times daily as directed   levocetirizine (XYZAL) 5 MG tablet Take 1 tablet (5 mg total) by mouth every evening.   loperamide (IMODIUM) 2 MG capsule Take 1 capsule (2 mg total) by mouth every 4 (four) hours as needed for diarrhea or loose stools. (Patient not taking: Reported on 07/22/2022)   losartan (COZAAR) 50 MG tablet Take 1 tablet (50 mg total) by mouth daily.   pantoprazole (PROTONIX) 40 MG tablet Take 1 tablet (40 mg total) by mouth 2 (two) times daily. Take twice a day for 1 month, then go back to daily   promethazine (PHENERGAN) 25 MG tablet Take 1 tablet (25 mg total) by mouth every 6 (six) hours as needed for nausea or vomiting.   spironolactone (ALDACTONE) 25 MG tablet Take 0.5 tablets (12.5 mg total) by mouth daily.   traZODone (DESYREL) 50 MG tablet Take 1 tablet (50 mg total) by mouth at bedtime.   TRESIBA FLEXTOUCH 200 UNIT/ML FlexTouch Pen Inject 32 Units into the skin 2 (two) times daily.   triamcinolone cream (KENALOG) 0.1 % Apply 1 Application topically 2 (two) times daily.   [DISCONTINUED] insulin aspart (NOVOLOG) 100 UNIT/ML injection Insulin (Novolog) sliding scale for additional Novolog: administer 79minutes before meals. Sugar <150 - no units Sugar >151-200 - 4 units Sugar >200-250 - 6 units Sugar >251-300 - 10 units and call office. (Patient taking differently: Inject 4-10 Units into the skin See admin instructions. Insulin (Novolog) sliding scale for additional Novolog: administer 25minutes before meals. Sugar <150 - no units Sugar >151-200 - 4 units Sugar >200-250 - 6 units Sugar >251-300 - 10 units and call office.)   No facility-administered encounter medications on file as of 09/13/2022.     Care Gaps: Ophthalmology Exam Hepatitis C Screening Colonoscopy Mammogram Influenza Vaccine HTN: 180/92 on 07/22/2022  Star Rating Drugs: Atorvastatin 20 mg last filled [facility]/29/2022 [facility]  Losartan 50 mg last filled 06/29/2022 [facility].   [place] Pharmacist Assistant [phone]

## 2022-09-13 NOTE — Patient Outreach (Signed)
  Care Coordination   Initial Visit Note   09/13/2022 Name: Brunella Wileman MRN: [id number] DOB: 01/24/1966  Shaeley Segall is a 56 y.o. year old female who sees Nche, Charlene Brooke, NP for primary care. I spoke with  [name] by phone today.  What matters to the patients health and wellness today?  Food and Transportation    Goals Addressed             This [place] submitted [id number] Referral for Transportation and Meal delivery.  SW completed SDOH and no further assistance is needed.         SDOH assessments and interventions completed:  Yes     Care Coordination Interventions Activated:  Yes  Care Coordination Interventions:  Yes, provided   Follow up plan: No further intervention required.   Encounter Outcome:  Pt. Visit Completed

## 2022-09-15 ENCOUNTER — Telehealth: Payer: Medicare Other

## 2022-09-15 ENCOUNTER — Ambulatory Visit (INDEPENDENT_AMBULATORY_CARE_PROVIDER_SITE_OTHER): Payer: Medicare Other

## 2022-09-15 DIAGNOSIS — J301 Allergic rhinitis due to pollen: Secondary | ICD-10-CM

## 2022-09-15 DIAGNOSIS — N185 Chronic kidney disease, stage 5: Secondary | ICD-10-CM

## 2022-09-15 DIAGNOSIS — J9600 Acute respiratory failure, unspecified whether with hypoxia or hypercapnia: Secondary | ICD-10-CM | POA: Diagnosis not present

## 2022-09-15 DIAGNOSIS — J45909 Unspecified asthma, uncomplicated: Secondary | ICD-10-CM | POA: Diagnosis not present

## 2022-09-15 DIAGNOSIS — E1142 Type 2 diabetes mellitus with diabetic polyneuropathy: Secondary | ICD-10-CM

## 2022-09-15 DIAGNOSIS — I1A0 Resistant hypertension: Secondary | ICD-10-CM

## 2022-09-15 DIAGNOSIS — F5101 Primary insomnia: Secondary | ICD-10-CM

## 2022-09-15 NOTE — Progress Notes (Unsigned)
Chronic Care Management Pharmacy Note  09/15/2022 Name:  Maureen Jones MRN:  [id number] DOB:  05-03-1966  Summary: ***  Recommendations/Changes made from today's visit: ***  Plan: ***   Subjective: Maureen Jones is an 56 y.o. year old female who is a primary patient of Nche, Charlene Brooke, NP.  The CCM team was consulted for assistance with disease management and care coordination needs.    Engaged with patient by telephone for initial visit in response to provider referral for pharmacy case management and/or care coordination services.   Consent to Services:  The patient was given the following information about Chronic Care Management services today, agreed to services, and gave verbal consent: 1. CCM service includes personalized support from designated clinical staff supervised by the primary care provider, including individualized plan of care and coordination with other care providers 2. 24/7 contact phone numbers for assistance for urgent and routine care needs. 3. Service will only be billed when office clinical staff spend 20 minutes or more in a month to coordinate care. 4. Only one practitioner may furnish and bill the service in a calendar month. 5.The patient may stop CCM services at any time (effective at the end of the month) by phone call to the office staff. 6. The patient will be responsible for cost sharing (co-pay) of up to 20% of the service fee (after annual deductible is met). Patient agreed to services and consent obtained.  Patient Care Team: Nche, Charlene Brooke, NP as PCP - General (Internal Medicine) Germaine Pomfret, [facility] (Pharmacist) Drema Pry as Social Worker  Recent office visits: 07/22/2022 Wilfred Lacy NP (PCP) Start Trazodone 50 mg nightly, Return in about 4 weeks  07/04/2022 Wilfred Lacy NP (PCP) Decrease spironolactone to 12.5 daily, start clonidine 0.$RemoveBeforeDEI'1mg'[id number]$  BID, Ambulatory referral to [facility] Clinic ,AMB Referral to  [place], Referral to Nutrition and Diabetes Services, Ambulatory referral to Psychology, Ambulatory referral to Neurology, Ambulatory referral to Podiatry, Return in about 2 weeks , referral to endocrinology 04/05/2022 Wilfred Lacy NP (PCP)  start Flonase 50 MCG/ACT 2 spray daily, start levocetirizine (XYZAL) 5 MG tablet daily 04/01/2022 Wilfred Lacy NP (PCP) Start potassium chloride 20 MEQ Daily, start spironolactone 25 mg daily, Decrease pantoprazole to 40 mg daily, Return in about 1 month  Recent consult visits: 08/09/2022 Dr. Sabino Gasser MD (otolaryngology) Increase her daily Flonase and Astelin use to twice daily, referral to [facility],  07/14/2022 Lanae Crumbly DPM (Podiatry) Start triamcinolone cream (KENALOG) 0.1 %  Hospital visits:  Admitted to the hospital on 05/22/2022 due to Shortness of breath. Discharge date was 05/23/2022. Discharged from [facility].    Admitted to the hospital on 03/31/2022 due to Tracheostomy complication. Discharge date was 03/31/2022. Discharged from [facility].    Objective:  Lab Results  Component Value Date   CREATININE 4.73 (HH) 07/04/2022   BUN 40 (H) 07/04/2022   GFR 9.86 (LL) 07/04/2022   GFRNONAA 13 (L) 05/22/2022   NA 137 07/04/2022   K 4.4 07/04/2022   CALCIUM 8.8 07/04/2022   CO2 23 07/04/2022   GLUCOSE 417 (H) 07/04/2022    Lab Results  Component Value Date/Time   HGBA1C 8.5 (H) 07/04/2022 10:14 AM   HGBA1C 8.0 (H) 04/01/2022 11:47 AM   GFR 9.86 (LL) 07/04/2022 10:14 AM   GFR 15.85 (L) 04/01/2022 11:47 AM    Last diabetic Eye exam: No results found for: "HMDIABEYEEXA"  Last diabetic Foot exam: No results found for: "HMDIABFOOTEX"  Lab Results  Component Value Date   TRIG 212 (H) 10/14/2021       Latest Ref Rng & Units 07/04/2022   10:14 AM 02/14/2022    5:37 AM 02/13/2022    9:00 PM  Hepatic Function  Total Protein 6.5 - 8.1 g/dL  6.0  6.2   Albumin 3.5 -  5.2 g/dL 3.4  2.6  2.7   AST 15 - 41 U/L  18  18   ALT 0 - 44 U/L  11  12   Alk Phosphatase 38 - 126 U/L  108  125   Total Bilirubin 0.3 - 1.2 mg/dL  0.6  0.3     No results found for: "TSH", "FREET4"     Latest Ref Rng & Units 05/22/2022    8:32 PM 02/17/2022   12:42 AM 02/14/2022    5:37 AM  CBC  WBC 4.0 - 10.5 K/uL 10.8  10.4  17.4   Hemoglobin 12.0 - 15.0 g/dL 9.2  9.9  10.3   Hematocrit 36.0 - 46.0 % 29.7  31.6  33.0   Platelets 150 - 400 K/uL 232  226  285     No results found for: "VD25OH"  Clinical ASCVD: No  The ASCVD Risk score (Arnett DK, et al., 2019) failed to calculate for the following reasons:   The valid systolic blood pressure range is 90 to 200 mmHg       07/04/2022   10:17 AM 11/22/2021   11:37 AM  Depression screen PHQ 2/9  Decreased Interest 2 0  Down, Depressed, Hopeless 0 2  PHQ - 2 Score 2 2  Altered sleeping 3 2  Tired, decreased energy 3 2  Change in appetite 3 0  Feeling bad or failure about yourself  0 1  Trouble concentrating 3 0  Moving slowly or fidgety/restless 0 0  Suicidal thoughts 0 0  PHQ-9 Score 14 7  Difficult doing work/chores Somewhat difficult     Social History   Tobacco Use  Smoking Status Former   Types: Cigarettes  Smokeless Tobacco Never   BP Readings from Last 3 Encounters:  07/22/22 (!) 180/92  07/04/22 (!) 186/112  05/23/22 (!) 196/87   Pulse Readings from Last 3 Encounters:  07/22/22 77  07/04/22 (!) 101  05/23/22 89   Wt Readings from Last 3 Encounters:  07/22/22 225 lb 9.6 oz (102.3 kg)  07/04/22 236 lb 6.4 oz (107.2 kg)  04/01/22 245 lb (111.1 kg)   BMI Readings from Last 3 Encounters:  07/22/22 36.41 kg/m  07/04/22 38.16 kg/m  04/01/22 39.54 kg/m    Assessment/Interventions: Review of patient past medical history, allergies, medications, health status, including review of consultants reports, laboratory and other test data, was performed as part of comprehensive evaluation and provision of  chronic care management services.   SDOH:  (Social Determinants of Health) assessments and interventions performed: Yes SDOH Interventions    Flowsheet Row Office Visit from 07/22/2022 in [place]  SDOH Interventions   Depression Interventions/Treatment  Medication, Counseling      SDOH Screenings   Depression (PHQ2-9): High Risk (07/04/2022)  Tobacco Use: Medium Risk (07/22/2022)    CCM Care Plan  No Known Allergies  Medications Reviewed Today     Reviewed by Lucillie Garfinkel, CMA (Certified Medical Assistant) on 07/22/22 at [place] List Status: <None>   Medication Order Taking? Sig Documenting Provider Last Dose Status Informant  albuterol (PROVENTIL) (2.5 MG/3ML) 0.083% nebulizer solution [id number]  No INHALE 3 ML BY NEBULIZATION EVERY 6 HOURS AS NEEDED FOR WHEEZING OR SHORTNESS OF BREATH  Patient not taking: Reported on 07/22/2022   Nche, Charlene Brooke, NP Not Taking Active   amLODipine (NORVASC) 10 MG tablet [id number] Yes Take 1 tablet (10 mg total) by mouth at bedtime. Nche, Charlene Brooke, NP Taking Active   atorvastatin (LIPITOR) 20 MG tablet [id number] Yes Take 1 tablet (20 mg total) by mouth daily. Nche, Charlene Brooke, NP Taking Active   budesonide (PULMICORT) 0.25 MG/2ML nebulizer solution [id number] No Use 1 vial (0.25 mg total) by nebulization 2 (two) times daily.  Patient not taking: Reported on 07/22/2022   Flossie Buffy, NP Not Taking Active   carvedilol (COREG) 25 MG tablet [id number] Yes Take 1 tablet (25 mg total) by mouth 2 (two) times daily. Nche, Charlene Brooke, NP Taking Active   clonazePAM (KLONOPIN) 0.5 MG tablet [id number] Yes Take 1/2 tablet (0.25 mg total) by mouth daily at bedtime. Barb Merino, MD Taking Active Self  cloNIDine (CATAPRES) 0.1 MG tablet [id number] Yes Take 1 tablet (0.1 mg total) by mouth 2 (two) times daily. Nche, Charlene Brooke, NP Taking Active   Continuous Blood Gluc Sensor (FREESTYLE LIBRE 14 DAY SENSOR) [place]  [id number] Yes 1 Units by Does not apply route every 14 (fourteen) days. Flossie Buffy, NP Taking Active   DULoxetine (CYMBALTA) 60 MG capsule [id number] Yes Take 1 capsule (60 mg total) by mouth daily. Nche, Charlene Brooke, NP Taking Active   fluticasone (FLONASE) 50 MCG/ACT nasal spray [id number] Yes Place 2 sprays into both nostrils daily. Flossie Buffy, NP Taking Active   glucose blood [facility] VERIO) test strip [id number] Yes Use as instructed Nche, Charlene Brooke, NP Taking Active   hydrALAZINE (APRESOLINE) 100 MG tablet [id number] Yes Take 1 tablet (100 mg total) by mouth 3 (three) times daily. Nche, Charlene Brooke, NP Taking Active    Patient taking differently:   Discontinued 12/17/21 1307 (Reorder) insulin lispro (HUMALOG) 100 UNIT/ML KwikPen [id number] Yes Inject 16 Units into the skin 3 (three) times daily with meals. Flossie Buffy, NP Taking Active   Insulin Pen Needle 32G X 4 MM MISC [id number] Yes Use to inject insulin up to 4 times daily as directed Nche, Charlene Brooke, NP Taking Active   levocetirizine (XYZAL) 5 MG tablet [id number] Yes Take 1 tablet (5 mg total) by mouth every evening. Nche, Charlene Brooke, NP Taking Active   loperamide (IMODIUM) 2 MG capsule [id number] No Take 1 capsule (2 mg total) by mouth every 4 (four) hours as needed for diarrhea or loose stools.  Patient not taking: Reported on 07/22/2022   Barb Merino, MD Not Taking Active Self  loratadine (CLARITIN REDITABS) 10 MG dissolvable tablet [id number] Yes Take 1 tablet (10 mg total) by mouth daily. Erick Colace, NP Taking Active   losartan (COZAAR) 50 MG tablet [id number] Yes Take 1 tablet (50 mg total) by mouth daily. Nche, Charlene Brooke, NP Taking Active   pantoprazole (PROTONIX) 40 MG tablet [id number]  Take 1 tablet (40 mg total) by mouth 2 (two) times daily. Take twice a day for 1 month, then go back to daily Erick Colace, NP  Active   promethazine (PHENERGAN) 25 MG tablet [id number] Yes Take 1  tablet (25 mg total) by mouth every 6 (six) hours as needed for nausea or vomiting. Barb Merino, MD Taking Active Self  spironolactone (ALDACTONE) 25 MG tablet [id number] Yes Take 0.5 tablets (12.5 mg total) by mouth daily. Nche,  Charlene Brooke, NP Taking Active   TRESIBA FLEXTOUCH 200 UNIT/ML FlexTouch Pen [id number] Yes Inject 32 Units into the skin 2 (two) times daily. Nche, Charlene Brooke, NP Taking Active   triamcinolone cream (KENALOG) 0.1 % [id number] Yes Apply 1 Application topically 2 (two) times daily. Criselda Peaches, DPM Taking Active             Patient Active Problem List   Diagnosis Date Noted   Morbid (severe) obesity due to excess calories ([place]) 07/22/2022   Difficulty with speech 07/05/2022   Tracheostomy status (HCC)    Subglottic stenosis    Nausea vomiting and diarrhea 02/13/2022   CKD (chronic kidney disease) stage 5, GFR less than 15 ml/min (HCC) 11/22/2021   Mood disorder ([place]) 11/22/2021   Tracheostomy dependence ([place])    Subglottic edema    Vocal cord dysfunction    Endotracheal tube present    Bilateral carpal tunnel syndrome 11/15/2019   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   Insomnia 11/15/2019   Mild intermittent asthma without complication 78/29/5621   OSA (obstructive sleep apnea) 11/15/2019   Seasonal allergic rhinitis 11/15/2019   Tobacco abuse 11/15/2019   DM (diabetes mellitus) ([place]) 11/15/2019     There is no immunization history on file for this patient.  Conditions to be addressed/monitored:  Hypertension, Hyperlipidemia, Diabetes, Chronic Kidney Disease, Anxiety, Tobacco use, and Morbid Obesity  There are no care plans that you recently modified to display for this patient.    Medication Assistance: Application for ***  medication assistance program. in process.  Anticipated assistance start date ***.  See plan of care for additional detail.  Compliance/Adherence/Medication fill history: Care Gaps: Ophthalmology Exam Hepatitis C  Screening Colonoscopy Mammogram Influenza Vaccine HTN: 180/92 on 07/22/2022  Star-Rating Drugs: Atorvastatin 20 mg last filled [facility]/29/[facility]  Losartan 50 mg last filled 06/29/2022 [facility]   [facility]  [id number] [address], [place] [address] Phone: [phone] Fax: [phone]  Uses pill box? Yes Pt endorses 100% compliance  We discussed: Current pharmacy is preferred with insurance plan and patient is satisfied with pharmacy services Patient decided to: Continue current medication management strategy  Care Plan and Follow Up Patient Decision:  Patient agrees to Care Plan and Follow-up.  Plan: Telephone follow up appointment with care management team member scheduled for:  12/15/2022 at 3:00 PM  Junius Argyle, PharmD, Para [place], CPP Clinical Pharmacist Practitioner  [facility]  [phone]  Current Barriers:  Unable to achieve control of blood pressure   Pharmacist Clinical Goal(s):  Patient will verbalize ability to afford treatment regimen through collaboration with PharmD and provider.   Interventions: 1:1 collaboration with Nche, Charlene Brooke, NP regarding development and update of comprehensive plan of care as evidenced by provider attestation and co-signature Inter-disciplinary care team collaboration (see longitudinal plan of care) Comprehensive medication review performed; medication list updated in electronic medical record  Hypertension (BP goal <140/90) -Uncontrolled -Current treatment: Amlodipine 10 mg nightly  Carvedilol 25 mg twice daily  Clonidine 0.1 mg twice daily  Hydralazine 100 mg three times daily  Losartan 50 mg daily  Spironolactone 25 mg tablet daily  -Medications previously tried: NA  -Current home readings: 133/68, 164/86,  -Denies hypotensive/hypertensive symptoms -Recommended to  continue current medication  Hyperlipidemia: (LDL goal < 70) -Controlled -Current treatment: Atorvastatin 20 m gdaily  -Medications previously tried: NA  -Recommended to continue current medication  Diabetes (A1c goal <8%) -Uncontrolled -Current medications: Tresiba 32 units twice daily  -Medications previously tried: NA  -Current home glucose readings fasting glucose: 106, 227  -Reports hypoglycemic/hyperglycemic symptoms -START PAP for Tresiba -Recommended to continue current medication  Insomnia (Goal: Improve sleep quality) -Uncontrolled -Current treatment  Trazodone 50 mg nightly  -Medications previously tried: NA  -Reports trazodone is ineffective most nights. She only gets 2-3 hours of sleep at most. She also has trouble falling asleep, typically requiring a few hours.  -Recommended to continue current medication  Allergic Rhinitis (Goal: Minimize symptoms) -Uncontrolled -Seasonal  -Current treatment  Asetalin nasal spray  Flonase 2 sprays daily  Levocetirizine 5 mg nightly  -Medications previously tried: Claritin, Pseudofed, Benadryll,  Zyrtec, Zinc  -Recommended to continue current medication  Patient Goals/Self-Care Activities Patient will:  - check glucose daily before breakfast, document, and provide at future appointments  Follow Up Plan: Telephone follow up appointment with care management team member scheduled for:  12/15/2022 at 3:00 PM

## 2022-09-19 ENCOUNTER — Telehealth: Payer: Self-pay

## 2022-09-19 NOTE — Progress Notes (Signed)
Chronic Care Management Pharmacy Assistant   Name: Maureen Jones  MRN: [id number] DOB: [birth year]  Reason for Encounter: Medication Review/Patient assistance application for [place].   Recent office visits:  None ID  Recent consult visits:  None ID  Hospital visits:  None in previous 6 months  Medications: Outpatient Encounter Medications as of 09/19/2022  Medication Sig   amLODipine (NORVASC) 10 MG tablet Take 1 tablet (10 mg total) by mouth at bedtime.   atorvastatin (LIPITOR) 20 MG tablet Take 1 tablet (20 mg total) by mouth daily.   azelastine (ASTELIN) 0.1 % nasal spray Place 2 sprays into both nostrils 2 (two) times daily. Use in each nostril as directed   carvedilol (COREG) 25 MG tablet Take 1 tablet (25 mg total) by mouth 2 (two) times daily.   cloNIDine (CATAPRES) 0.1 MG tablet Take 1 tablet (0.1 mg total) by mouth 2 (two) times daily.   Continuous Blood Gluc Sensor (FREESTYLE LIBRE 14 DAY SENSOR) MISC 1 Units by Does not apply route every 14 (fourteen) days.   DULoxetine (CYMBALTA) 60 MG capsule Take 1 capsule (60 mg total) by mouth daily.   fluticasone (FLONASE) 50 MCG/ACT nasal spray Place 2 sprays into both nostrils daily.   glucose blood (ONETOUCH VERIO) test strip Use as instructed   hydrALAZINE (APRESOLINE) 100 MG tablet Take 1 tablet (100 mg total) by mouth 3 (three) times daily.   Insulin Pen Needle 32G X 4 MM MISC Use to inject insulin up to 4 times daily as directed   levocetirizine (XYZAL) 5 MG tablet Take 1 tablet (5 mg total) by mouth every evening.   losartan (COZAAR) 50 MG tablet Take 1 tablet (50 mg total) by mouth daily.   pantoprazole (PROTONIX) 40 MG tablet Take 1 tablet (40 mg total) by mouth 2 (two) times daily. Take twice a day for 1 month, then go back to daily (Patient taking differently: Take 40 mg by mouth daily as needed (GERD).)   promethazine (PHENERGAN) 25 MG tablet Take 1 tablet (25 mg total) by mouth every 6 (six) hours as needed for  nausea or vomiting.   spironolactone (ALDACTONE) 25 MG tablet Take 0.5 tablets (12.5 mg total) by mouth daily. (Patient taking differently: Take 25 mg by mouth daily.)   traZODone (DESYREL) 50 MG tablet Take 1 tablet (50 mg total) by mouth at bedtime.   TRESIBA FLEXTOUCH 200 UNIT/ML FlexTouch Pen Inject 32 Units into the skin 2 (two) times daily.   [DISCONTINUED] insulin aspart (NOVOLOG) 100 UNIT/ML injection Insulin (Novolog) sliding scale for additional Novolog: administer 57minutes before meals. Sugar <150 - no units Sugar >151-200 - 4 units Sugar >200-250 - 6 units Sugar >251-300 - 10 units and call office. (Patient taking differently: Inject 4-10 Units into the skin See admin instructions. Insulin (Novolog) sliding scale for additional Novolog: administer 67minutes before meals. Sugar <150 - no units Sugar >151-200 - 4 units Sugar >200-250 - 6 units Sugar >251-300 - 10 units and call office.)   No facility-administered encounter medications on file as of 09/19/2022.    Care Gaps: Ophthalmology Exam Hepatitis C Screening Colonoscopy Mammogram Influenza Vaccine HTN: 180/92 on 07/22/2022   Star Rating Drugs: Atorvastatin 20 mg last filled 06/29/2022 [facility].  Losartan 50 mg last filled 06/29/2022 90 day supply at [facility].   I received a task from Junius Argyle, CPP requesting that I start the application for patient assistance on the medication Tresiba U- 200 , 32 units twice daily-  prescribe by PCP Nche.    The application will be mailed.Once she receives the application she will need to complete her part of the application and return it to her PCP office for Junius Argyle, CPP to fax over to the [employer] for processing.Patient should include a copy of her proof of income AND a copy of her Explanation of Benefits (EOB) statement from her insurance. The application will need to be faxed to [phone] and the phone number that can be called to check the  status of the application will be [phone].    Application emailed to Junius Argyle, CPP for review and to be mail to patient's home.  [place] Pharmacist Assistant [phone]

## 2022-09-20 IMAGING — DX DG CHEST 1V PORT
1 series · 1 of 1 positions shown · non-contrast
Comparison: AP chest 02/13/2022, 01/14/2022

CLINICAL DATA: Shortness of breath and cough. Requests tracheotomy
replaced.

EXAM:
PORTABLE CHEST 1 VIEW

[chest ap]
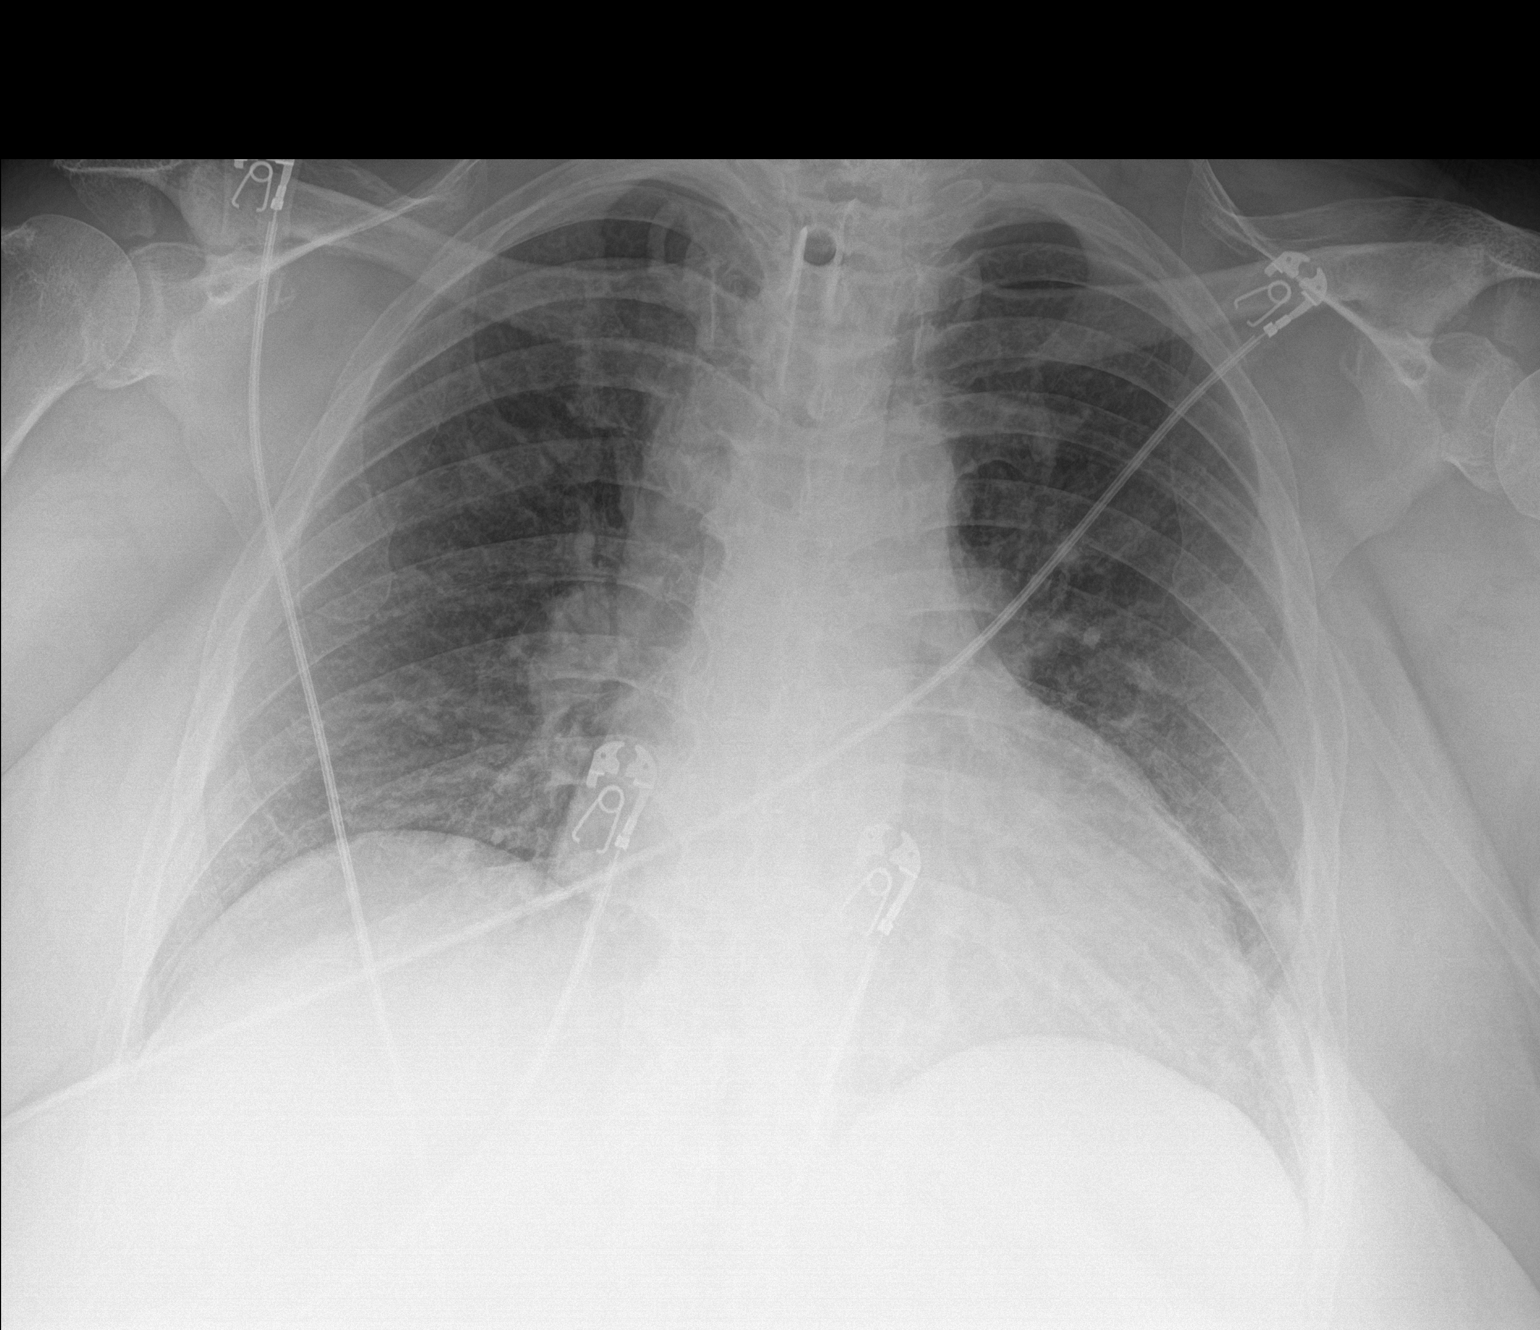

[1 of 1 positions shown; findings below may reference images not displayed]

FINDINGS: Cardiac silhouette again appears mildly enlarged. Mediastinal
contours are within normal limits. Tracheostomy tube overlies the
midline trachea. Mild interval increase in bilateral mid and lower
lung interstitial thickening. No pleural effusion or pneumothorax.
No acute skeletal abnormality.
IMPRESSION: Mild bilateral mid and lower lung interstitial thickening may
reflect subsegmental atelectasis versus minimal interstitial
pulmonary edema.

## 2022-09-21 NOTE — Patient Instructions (Signed)
Visit Information It was great speaking with you today!  Please let me know if you have any questions about our visit.  Patient Care Plan: General Pharmacy (Adult)     Problem Identified: Hypertension, Hyperlipidemia, Diabetes, Chronic Kidney Disease, Anxiety, Tobacco use, and Morbid Obesity   Priority: High     Long-Range Goal: Patient-Specific Goal   Start Date: 09/21/2022  Expected End Date: 09/22/2023  This Visit's Progress: On track  Priority: High  Note:   Current Barriers:  Unable to achieve control of blood pressure   Pharmacist Clinical Goal(s):  Patient will verbalize ability to afford treatment regimen through collaboration with PharmD and provider.   Interventions: 1:1 collaboration with Nche, Charlene Brooke, NP regarding development and update of comprehensive plan of care as evidenced by provider attestation and co-signature Inter-disciplinary care team collaboration (see longitudinal plan of care) Comprehensive medication review performed; medication list updated in electronic medical record  Hypertension (BP goal <140/90) -Uncontrolled -Current treatment: Amlodipine 10 mg nightly  Carvedilol 25 mg twice daily  Clonidine 0.1 mg twice daily  Hydralazine 100 mg three times daily  Losartan 50 mg daily  Spironolactone 25 mg tablet daily  -Medications previously tried: NA  -Current home readings: 133/68, 164/86,  -Denies hypotensive/hypertensive symptoms -Recommended to continue current medication  Hyperlipidemia: (LDL goal < 70) -Controlled -Current treatment: Atorvastatin 20 m gdaily  -Medications previously tried: NA  -Recommended to continue current medication  Diabetes (A1c goal <8%) -Uncontrolled -Current medications: Tresiba 32 units twice daily  -Medications previously tried: NA  -Current home glucose readings fasting glucose: 106, 227  -Reports hypoglycemic/hyperglycemic symptoms -START PAP for Tresiba -Recommended to continue current  medication  Insomnia (Goal: Improve sleep quality) -Uncontrolled -Current treatment  Trazodone 50 mg nightly  -Medications previously tried: NA  -Reports trazodone is ineffective most nights. She only gets 2-3 hours of sleep at most. She also has trouble falling asleep, typically requiring a few hours.  -Recommended to continue current medication  Allergic Rhinitis (Goal: Minimize symptoms) -Uncontrolled -Seasonal  -Current treatment  Asetalin nasal spray  Flonase 2 sprays daily  Levocetirizine 5 mg nightly  -Medications previously tried: Claritin, Pseudofed, Benadryll,  Zyrtec, Zinc  -Recommended to continue current medication  Patient Goals/Self-Care Activities Patient will:  - check glucose daily before breakfast, document, and provide at future appointments  Follow Up Plan: Telephone follow up appointment with care management team member scheduled for:  12/15/2022 at 3:00 PM    Ms. Ordway was given information about Chronic Care Management services today including:  CCM service includes personalized support from designated clinical staff supervised by her physician, including individualized plan of care and coordination with other care providers 24/7 contact phone numbers for assistance for urgent and routine care needs. Standard insurance, coinsurance, copays and deductibles apply for chronic care management only during months in which we provide at least 20 minutes of these services. Most insurances cover these services at 100%, however patients may be responsible for any copay, coinsurance and/or deductible if applicable. This service may help you avoid the need for more expensive face-to-face services. Only one practitioner may furnish and bill the service in a calendar month. The patient may stop CCM services at any time (effective at the end of the month) by phone call to the office staff.  Patient agreed to services and verbal consent obtained.   Patient verbalizes  understanding of instructions and care plan provided today and agrees to view in [place]. Active MyChart status and patient understanding of how to  access instructions and care plan via MyChart confirmed with patient.     Junius Argyle, PharmD, Para [place], CPP Clinical Pharmacist Practitioner  [facility]  [phone]

## 2022-09-27 ENCOUNTER — Ambulatory Visit (HOSPITAL_COMMUNITY)
Admission: RE | Admit: 2022-09-27 | Discharge: 2022-09-27 | Disposition: A | Discharge status: Home / Self Care | Payer: Medicare Other | Source: Ambulatory Visit | Attending: Acute Care | Admitting: Acute Care

## 2022-09-27 DIAGNOSIS — Z930 Tracheostomy status: Secondary | ICD-10-CM | POA: Diagnosis not present

## 2022-09-27 DIAGNOSIS — Q394 Esophageal web: Secondary | ICD-10-CM | POA: Insufficient documentation

## 2022-09-27 DIAGNOSIS — K219 Gastro-esophageal reflux disease without esophagitis: Secondary | ICD-10-CM | POA: Diagnosis present

## 2022-09-27 DIAGNOSIS — J383 Other diseases of vocal cords: Secondary | ICD-10-CM | POA: Diagnosis not present

## 2022-09-27 DIAGNOSIS — J386 Stenosis of larynx: Secondary | ICD-10-CM | POA: Diagnosis not present

## 2022-09-27 DIAGNOSIS — J45909 Unspecified asthma, uncomplicated: Secondary | ICD-10-CM | POA: Diagnosis not present

## 2022-09-27 DIAGNOSIS — J9509 Other tracheostomy complication: Secondary | ICD-10-CM | POA: Diagnosis not present

## 2022-09-27 DIAGNOSIS — G4733 Obstructive sleep apnea (adult) (pediatric): Secondary | ICD-10-CM | POA: Diagnosis not present

## 2022-09-27 DIAGNOSIS — L928 Other granulomatous disorders of the skin and subcutaneous tissue: Secondary | ICD-10-CM | POA: Diagnosis not present

## 2022-09-27 NOTE — Progress Notes (Signed)
Reason for visit Trach change  HPI Maureen Jones presents today for f/u trach care. She has known h/o VCD, I have been following her for trach management. I recently referred her to [facility] w/ concern for Subglottic stenosis. 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 Additionally there was consideration for [facility] laryngoscopy with laser excision. She presents today for trach change   Review of Systems  Constitutional:  Negative for diaphoresis, fever and weight loss.  HENT:  Negative for nosebleeds.   Eyes: Negative.   Respiratory:         She sleeps in chair. Has had more than one episode where she has woken up short of breath acutely feeling like she is occluded.   Cardiovascular: Negative.   Gastrointestinal: Negative.   Genitourinary: Negative.   Skin: Negative.    Exam  37 yof ambulatory at baseline HENT NCAT no JVD has 5 prox xlt in place. It is midline and there is some granulation tissue on each side of the trach stoma She is able to phonate w/out PMV  Pulm dec bases Card rrr Abd soft Ext warm and dry   Procedures Trach change current trach removed. Site inspected (exam as noted above) then using sterile lubricating jelly an new 5 prox xlt was inserted over obturator w/out difficulty. Pt tolerated well  Bedside endoscopy   a  b The patient was reporting waking up feeling like she was choking. Above notes the airway open (b) and then picture (a) shows trach/airway occluded w/ neck tucked position she sleeps in at times   Principal Problem:   Tracheostomy dependence ([place]) Active Problems:   Vocal cord dysfunction   OSA  (obstructive sleep apnea)   Subglottic stenosis   LPRD (laryngopharyngeal reflux disease)  LPRD (laryngopharyngeal reflux disease)  Subglottic stenosis Overview: Flexible Fiberoptic Endoscopy on 8/29 subglottic web     Tracheostomy dependence ([place]) Overview: Size 5 prox xlt/ last change 10/17  Discussion [name] is patent. She does get obstruction when she tucks chin. This at times is the position she falls asleep in when she is in chair. I think this is responsible for the waking up feeling like she is "choking"  Plan Not candidate for decannulation until subglottic stenosis addressed Change trach every 4 weeks (has some granulation tissue at stoma site) She will need to get referral to new ENT when she moves Getting head and neck pillow to support neck/airway at HS      My time 45 minutes   Erick Colace ACNP-BC [place] Pager # [phone] OR # [phone] if no answer

## 2022-09-27 NOTE — Progress Notes (Signed)
Tracheostomy Procedure Note  Maureen Jones [id number] October 24, 1966  Pre Procedure Tracheostomy Information  Trach Brand: Shiley Size:  60 XLTUP Style: Uncuffed Secured by: Velcro   Procedure: Trach Change  Post Procedure Tracheostomy Information Lidocaine neb given before bronchoscopy  Bronchoscopy done to visual airway during this visit  Trach Brand: Shiley Size:  60XLTUP Style: Uncuffed Secured by: Velcro   Post Procedure Evaluation:  ETCO2 positive color change from yellow to purple : Yes.   Vital signs:VSS Patients current condition: stable Complications: No apparent complications Trach site exam: clean, dry Wound care done: 4 x 4 gauze  Patient did tolerate procedure well.   Education: none  Prescription needs: none    Additional needs: none         . . .                                          . . Marland Kitchen

## 2022-10-05 ENCOUNTER — Emergency Department (HOSPITAL_COMMUNITY): Payer: Medicare Other

## 2022-10-05 ENCOUNTER — Inpatient Hospital Stay (HOSPITAL_COMMUNITY)
Admission: EM | Admit: 2022-10-05 | Discharge: 2022-10-14 | DRG: 683 | Disposition: A | Discharge status: Home / Self Care | Payer: Medicare Other | Attending: Internal Medicine | Admitting: Internal Medicine

## 2022-10-05 DIAGNOSIS — E1122 Type 2 diabetes mellitus with diabetic chronic kidney disease: Secondary | ICD-10-CM | POA: Diagnosis not present

## 2022-10-05 DIAGNOSIS — R197 Diarrhea, unspecified: Secondary | ICD-10-CM | POA: Diagnosis present

## 2022-10-05 DIAGNOSIS — N170 Acute kidney failure with tubular necrosis: Principal | ICD-10-CM | POA: Diagnosis present

## 2022-10-05 DIAGNOSIS — I1A0 Resistant hypertension: Secondary | ICD-10-CM | POA: Diagnosis present

## 2022-10-05 DIAGNOSIS — K219 Gastro-esophageal reflux disease without esophagitis: Secondary | ICD-10-CM | POA: Diagnosis present

## 2022-10-05 DIAGNOSIS — G4733 Obstructive sleep apnea (adult) (pediatric): Secondary | ICD-10-CM | POA: Diagnosis not present

## 2022-10-05 DIAGNOSIS — E869 Volume depletion, unspecified: Secondary | ICD-10-CM | POA: Diagnosis present

## 2022-10-05 DIAGNOSIS — R109 Unspecified abdominal pain: Secondary | ICD-10-CM | POA: Diagnosis not present

## 2022-10-05 DIAGNOSIS — D649 Anemia, unspecified: Secondary | ICD-10-CM | POA: Diagnosis not present

## 2022-10-05 DIAGNOSIS — E1159 Type 2 diabetes mellitus with other circulatory complications: Secondary | ICD-10-CM | POA: Diagnosis not present

## 2022-10-05 DIAGNOSIS — R112 Nausea with vomiting, unspecified: Secondary | ICD-10-CM | POA: Diagnosis present

## 2022-10-05 DIAGNOSIS — I700 Atherosclerosis of aorta: Secondary | ICD-10-CM | POA: Diagnosis not present

## 2022-10-05 DIAGNOSIS — Z20822 Contact with and (suspected) exposure to covid-19: Secondary | ICD-10-CM | POA: Diagnosis not present

## 2022-10-05 DIAGNOSIS — Z794 Long term (current) use of insulin: Secondary | ICD-10-CM

## 2022-10-05 DIAGNOSIS — R519 Headache, unspecified: Secondary | ICD-10-CM | POA: Diagnosis present

## 2022-10-05 DIAGNOSIS — I120 Hypertensive chronic kidney disease with stage 5 chronic kidney disease or end stage renal disease: Secondary | ICD-10-CM | POA: Diagnosis present

## 2022-10-05 DIAGNOSIS — Z515 Encounter for palliative care: Secondary | ICD-10-CM | POA: Diagnosis not present

## 2022-10-05 DIAGNOSIS — E119 Type 2 diabetes mellitus without complications: Secondary | ICD-10-CM

## 2022-10-05 DIAGNOSIS — Z7189 Other specified counseling: Secondary | ICD-10-CM | POA: Diagnosis not present

## 2022-10-05 DIAGNOSIS — J386 Stenosis of larynx: Secondary | ICD-10-CM | POA: Diagnosis not present

## 2022-10-05 DIAGNOSIS — Z6839 Body mass index (BMI) 39.0-39.9, adult: Secondary | ICD-10-CM | POA: Diagnosis not present

## 2022-10-05 DIAGNOSIS — R1115 Cyclical vomiting syndrome unrelated to migraine: Secondary | ICD-10-CM | POA: Diagnosis not present

## 2022-10-05 DIAGNOSIS — E1165 Type 2 diabetes mellitus with hyperglycemia: Secondary | ICD-10-CM | POA: Diagnosis present

## 2022-10-05 DIAGNOSIS — E785 Hyperlipidemia, unspecified: Secondary | ICD-10-CM | POA: Diagnosis not present

## 2022-10-05 DIAGNOSIS — J383 Other diseases of vocal cords: Secondary | ICD-10-CM | POA: Diagnosis present

## 2022-10-05 DIAGNOSIS — Z87891 Personal history of nicotine dependence: Secondary | ICD-10-CM

## 2022-10-05 DIAGNOSIS — N185 Chronic kidney disease, stage 5: Secondary | ICD-10-CM | POA: Diagnosis present

## 2022-10-05 DIAGNOSIS — R6889 Other general symptoms and signs: Secondary | ICD-10-CM | POA: Diagnosis not present

## 2022-10-05 DIAGNOSIS — K529 Noninfective gastroenteritis and colitis, unspecified: Principal | ICD-10-CM

## 2022-10-05 DIAGNOSIS — D631 Anemia in chronic kidney disease: Secondary | ICD-10-CM | POA: Diagnosis not present

## 2022-10-05 DIAGNOSIS — N179 Acute kidney failure, unspecified: Secondary | ICD-10-CM

## 2022-10-05 DIAGNOSIS — I10 Essential (primary) hypertension: Secondary | ICD-10-CM | POA: Diagnosis present

## 2022-10-05 DIAGNOSIS — Z79899 Other long term (current) drug therapy: Secondary | ICD-10-CM | POA: Diagnosis not present

## 2022-10-05 DIAGNOSIS — Z930 Tracheostomy status: Secondary | ICD-10-CM

## 2022-10-05 DIAGNOSIS — R739 Hyperglycemia, unspecified: Secondary | ICD-10-CM | POA: Diagnosis not present

## 2022-10-05 DIAGNOSIS — R140 Abdominal distension (gaseous): Secondary | ICD-10-CM | POA: Diagnosis not present

## 2022-10-05 DIAGNOSIS — Z743 Need for continuous supervision: Secondary | ICD-10-CM | POA: Diagnosis not present

## 2022-10-05 DIAGNOSIS — E1169 Type 2 diabetes mellitus with other specified complication: Secondary | ICD-10-CM

## 2022-10-05 DIAGNOSIS — K6389 Other specified diseases of intestine: Secondary | ICD-10-CM | POA: Diagnosis not present

## 2022-10-05 LAB — COMPREHENSIVE METABOLIC PANEL
ALT: 17 U/L (ref 0–44)
AST: 30 U/L (ref 15–41)
Albumin: 3.4 g/dL — ABNORMAL LOW (ref 3.5–5.0)
Alkaline Phosphatase: 114 U/L (ref 38–126)
Anion gap: 15 (ref 5–15)
BUN: 67 mg/dL — ABNORMAL HIGH (ref 6–20)
CO2: 20 mmol/L — ABNORMAL LOW (ref 22–32)
Calcium: 9.1 mg/dL (ref 8.9–10.3)
Chloride: 108 mmol/L (ref 98–111)
Creatinine, Ser: 7.56 mg/dL — ABNORMAL HIGH (ref 0.44–1.00)
GFR, Estimated: 6 mL/min — ABNORMAL LOW (ref >60)
Glucose, Bld: 250 mg/dL — ABNORMAL HIGH (ref 70–99)
Potassium: 4.9 mmol/L (ref 3.5–5.1)
Sodium: 143 mmol/L (ref 135–145)
Total Bilirubin: 0.7 mg/dL (ref 0.3–1.2)
Total Protein: 7.1 g/dL (ref 6.5–8.1)

## 2022-10-05 LAB — CBC WITH DIFFERENTIAL/PLATELET
Abs Immature Granulocytes: 0.05 10*3/uL (ref 0.00–0.07)
Basophils Absolute: 0 10*3/uL (ref 0.0–0.1)
Basophils Relative: 0 %
Eosinophils Absolute: 0 10*3/uL (ref 0.0–0.5)
Eosinophils Relative: 0 %
HCT: 29.7 % — ABNORMAL LOW (ref 36.0–46.0)
Hemoglobin: 9.3 g/dL — ABNORMAL LOW (ref 12.0–15.0)
Immature Granulocytes: 0 %
Lymphocytes Relative: 8 %
Lymphs Abs: 1 10*3/uL (ref 0.7–4.0)
MCH: 28.7 pg (ref 26.0–34.0)
MCHC: 31.3 g/dL (ref 30.0–36.0)
MCV: 91.7 fL (ref 80.0–100.0)
Monocytes Absolute: 0.6 10*3/uL (ref 0.1–1.0)
Monocytes Relative: 4 %
Neutro Abs: 11.4 10*3/uL — ABNORMAL HIGH (ref 1.7–7.7)
Neutrophils Relative %: 88 %
Platelets: 240 10*3/uL (ref 150–400)
RBC: 3.24 MIL/uL — ABNORMAL LOW (ref 3.87–5.11)
RDW: 13.2 % (ref 11.5–15.5)
WBC: 13.1 10*3/uL — ABNORMAL HIGH (ref 4.0–10.5)
nRBC: 0 % (ref 0.0–0.2)

## 2022-10-05 LAB — URINALYSIS, ROUTINE W REFLEX MICROSCOPIC
Bacteria, UA: NONE SEEN
Bilirubin Urine: NEGATIVE
Glucose, UA: >=500 mg/dL — AB
Ketones, ur: 5 mg/dL — AB
Leukocytes,Ua: NEGATIVE
Nitrite: NEGATIVE
Protein, ur: >=300 mg/dL — AB
Specific Gravity, Urine: 1.012 (ref 1.005–1.030)
pH: 6 (ref 5.0–8.0)

## 2022-10-05 LAB — LIPASE, BLOOD: Lipase: 40 U/L (ref 11–51)

## 2022-10-05 LAB — RESP PANEL BY RT-PCR (FLU A&B, COVID) ARPGX2
Influenza A by PCR: NEGATIVE
Influenza B by PCR: NEGATIVE
SARS Coronavirus 2 by RT PCR: NEGATIVE

## 2022-10-05 LAB — CBG MONITORING, ED: Glucose-Capillary: 243 mg/dL — ABNORMAL HIGH (ref 70–99)

## 2022-10-05 MED ORDER — ONDANSETRON HCL 4 MG/2ML IJ SOLN
4.0000 mg | Freq: Four times a day (QID) | INTRAMUSCULAR | Status: DC | PRN
  Administered 2022-10-06 – 2022-10-12 (×6): 4 mg via INTRAVENOUS
  Filled 2022-10-05 (×7): qty 2

## 2022-10-05 MED ORDER — TRAZODONE HCL 50 MG PO TABS
50.0000 mg | ORAL_TABLET | Freq: Every evening | ORAL | Status: DC | PRN
  Administered 2022-10-12 – 2022-10-13 (×3): 50 mg via ORAL
  Filled 2022-10-05: qty 1

## 2022-10-05 MED ORDER — HEPARIN SODIUM (PORCINE) 5000 UNIT/ML IJ SOLN
5000.0000 [IU] | Freq: Three times a day (TID) | INTRAMUSCULAR | Status: DC
  Administered 2022-10-05 – 2022-10-14 (×27): 5000 [IU] via SUBCUTANEOUS
  Filled 2022-10-05 (×27): qty 1

## 2022-10-05 MED ORDER — CLONIDINE HCL 0.1 MG PO TABS: 0.1000 mg | ORAL_TABLET | Freq: Two times a day (BID) | ORAL | Status: DC

## 2022-10-05 MED ORDER — SENNOSIDES-DOCUSATE SODIUM 8.6-50 MG PO TABS: 1.0000 | ORAL_TABLET | Freq: Every evening | ORAL | Status: DC | PRN

## 2022-10-05 MED ORDER — METOPROLOL TARTRATE 5 MG/5ML IV SOLN: 5.0000 mg | INTRAVENOUS | Status: DC | PRN

## 2022-10-05 MED ORDER — GUAIFENESIN 100 MG/5ML PO LIQD
5.0000 mL | ORAL | Status: DC | PRN
  Administered 2022-10-14: 5 mL via ORAL
  Filled 2022-10-05: qty 5

## 2022-10-05 MED ORDER — INSULIN GLARGINE-YFGN 100 UNIT/ML ~~LOC~~ SOLN
10.0000 [IU] | Freq: Two times a day (BID) | SUBCUTANEOUS | Status: DC
  Administered 2022-10-05 – 2022-10-06 (×2): 10 [IU] via SUBCUTANEOUS
  Filled 2022-10-05 (×4): qty 0.1

## 2022-10-05 MED ORDER — HYDRALAZINE HCL 50 MG PO TABS
100.0000 mg | ORAL_TABLET | Freq: Three times a day (TID) | ORAL | Status: DC
  Administered 2022-10-05 – 2022-10-14 (×27): 100 mg via ORAL
  Filled 2022-10-05 (×27): qty 2

## 2022-10-05 MED ORDER — ONDANSETRON HCL 4 MG/2ML IJ SOLN
4.0000 mg | Freq: Once | INTRAMUSCULAR | Status: AC
  Administered 2022-10-05: 4 mg via INTRAVENOUS
  Filled 2022-10-05: qty 2

## 2022-10-05 MED ORDER — SODIUM CHLORIDE 0.9 % IV SOLN: INTRAVENOUS | Status: DC

## 2022-10-05 MED ORDER — MORPHINE SULFATE (PF) 4 MG/ML IV SOLN
4.0000 mg | Freq: Once | INTRAVENOUS | Status: AC
  Administered 2022-10-05: 4 mg via INTRAVENOUS
  Filled 2022-10-05: qty 1

## 2022-10-05 MED ORDER — ATORVASTATIN CALCIUM 10 MG PO TABS
20.0000 mg | ORAL_TABLET | Freq: Every day | ORAL | Status: DC
  Administered 2022-10-06 – 2022-10-14 (×9): 20 mg via ORAL
  Filled 2022-10-05 (×9): qty 2

## 2022-10-05 MED ORDER — IPRATROPIUM-ALBUTEROL 0.5-2.5 (3) MG/3ML IN SOLN
3.0000 mL | RESPIRATORY_TRACT | Status: DC | PRN
  Administered 2022-10-12 (×2): 3 mL via RESPIRATORY_TRACT
  Filled 2022-10-05 (×2): qty 3

## 2022-10-05 MED ORDER — HYDRALAZINE HCL 20 MG/ML IJ SOLN: 10.0000 mg | INTRAMUSCULAR | Status: DC | PRN

## 2022-10-05 MED ORDER — CARVEDILOL 25 MG PO TABS
25.0000 mg | ORAL_TABLET | Freq: Two times a day (BID) | ORAL | Status: DC
  Administered 2022-10-06 – 2022-10-14 (×17): 25 mg via ORAL
  Filled 2022-10-05: qty 1
  Filled 2022-10-05: qty 2
  Filled 2022-10-05 (×15): qty 1

## 2022-10-05 MED ORDER — TRAZODONE HCL 50 MG PO TABS
50.0000 mg | ORAL_TABLET | Freq: Every day | ORAL | Status: DC
  Administered 2022-10-05 – 2022-10-13 (×9): 50 mg via ORAL
  Filled 2022-10-05 (×9): qty 1

## 2022-10-05 MED ORDER — AMLODIPINE BESYLATE 5 MG PO TABS
10.0000 mg | ORAL_TABLET | Freq: Once | ORAL | Status: AC
  Administered 2022-10-05: 10 mg via ORAL
  Filled 2022-10-05: qty 2

## 2022-10-05 MED ORDER — CARVEDILOL 12.5 MG PO TABS: 25.0000 mg | ORAL_TABLET | Freq: Two times a day (BID) | ORAL | Status: DC

## 2022-10-05 MED ORDER — AMLODIPINE BESYLATE 5 MG PO TABS: 10.0000 mg | ORAL_TABLET | Freq: Every day | ORAL | Status: DC

## 2022-10-05 MED ORDER — INSULIN ASPART 100 UNIT/ML IJ SOLN
0.0000 [IU] | Freq: Three times a day (TID) | INTRAMUSCULAR | Status: DC
  Administered 2022-10-06: 2 [IU] via SUBCUTANEOUS
  Administered 2022-10-06: 1 [IU] via SUBCUTANEOUS
  Administered 2022-10-06: 2 [IU] via SUBCUTANEOUS
  Administered 2022-10-08 – 2022-10-09 (×2): 1 [IU] via SUBCUTANEOUS
  Administered 2022-10-09 – 2022-10-10 (×3): 2 [IU] via SUBCUTANEOUS
  Administered 2022-10-10 – 2022-10-11 (×2): 1 [IU] via SUBCUTANEOUS
  Administered 2022-10-11 – 2022-10-12 (×3): 2 [IU] via SUBCUTANEOUS
  Administered 2022-10-12: 3 [IU] via SUBCUTANEOUS
  Administered 2022-10-13: 2 [IU] via SUBCUTANEOUS
  Administered 2022-10-13 (×2): 1 [IU] via SUBCUTANEOUS
  Administered 2022-10-14 (×2): 2 [IU] via SUBCUTANEOUS

## 2022-10-05 MED ORDER — HYDRALAZINE HCL 25 MG PO TABS
100.0000 mg | ORAL_TABLET | Freq: Once | ORAL | Status: AC
  Administered 2022-10-05: 100 mg via ORAL
  Filled 2022-10-05: qty 4

## 2022-10-05 MED ORDER — CARVEDILOL 12.5 MG PO TABS
25.0000 mg | ORAL_TABLET | Freq: Once | ORAL | Status: AC
  Administered 2022-10-05: 25 mg via ORAL
  Filled 2022-10-05: qty 2

## 2022-10-05 MED ORDER — PANTOPRAZOLE SODIUM 40 MG PO TBEC
40.0000 mg | DELAYED_RELEASE_TABLET | Freq: Every day | ORAL | Status: DC
  Administered 2022-10-05 – 2022-10-06 (×2): 40 mg via ORAL
  Filled 2022-10-05 (×2): qty 1

## 2022-10-05 MED ORDER — CLONIDINE HCL 0.1 MG PO TABS
0.1000 mg | ORAL_TABLET | Freq: Once | ORAL | Status: AC
  Administered 2022-10-05: 0.1 mg via ORAL
  Filled 2022-10-05: qty 1

## 2022-10-05 MED ORDER — INSULIN ASPART 100 UNIT/ML IJ SOLN
0.0000 [IU] | Freq: Every day | INTRAMUSCULAR | Status: DC
  Administered 2022-10-05 – 2022-10-11 (×3): 2 [IU] via SUBCUTANEOUS

## 2022-10-05 MED ORDER — AMLODIPINE BESYLATE 10 MG PO TABS
10.0000 mg | ORAL_TABLET | Freq: Every day | ORAL | Status: DC
  Administered 2022-10-06 – 2022-10-14 (×9): 10 mg via ORAL
  Filled 2022-10-05 (×2): qty 1
  Filled 2022-10-05: qty 2
  Filled 2022-10-05 (×6): qty 1

## 2022-10-05 MED ORDER — CLONIDINE HCL 0.1 MG PO TABS
0.1000 mg | ORAL_TABLET | Freq: Two times a day (BID) | ORAL | Status: DC
  Administered 2022-10-06 – 2022-10-08 (×5): 0.1 mg via ORAL
  Filled 2022-10-05 (×5): qty 1

## 2022-10-05 MED ORDER — LACTATED RINGERS IV BOLUS
1000.0000 mL | Freq: Once | INTRAVENOUS | Status: AC
  Administered 2022-10-05: 1000 mL via INTRAVENOUS

## 2022-10-05 NOTE — ED Triage Notes (Signed)
EMS stated, abdominal pain with N/V for 8 days.

## 2022-10-05 NOTE — H&P (Signed)
History and Physical    Elton Catalano WRU:[id number] DOB: Aug 12, 1966 DOA: 10/05/2022  PCP: Flossie Buffy, NP Patient coming from: Home  Chief Complaint: Nausea vomiting  HPI: Maureen Jones is a 56 y.o. 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  Patient states she has had off-and-on nausea vomiting for the past 5-7 days with poor oral intake.  But her symptoms worsen over last 24 hours therefore came to the hospital.  Denies any hematemesis or melena.  Reports of some pain in the right lower quadrant, denies any history of renal stones.  Tells me a urine has been mostly yellow but on the darker side of the last few days.  Denies any fevers, chills or other complaints.  Denies eating anything out of ordinary recently as well.  In the ER she was noted to have creatinine of 7.56, baseline 4.0, BUN 67, potassium 4.9.  CT of the abdomen pelvis without contrast was unremarkable.  Initially also hypertensive.  She is being admitted for AKI.  ED provider to consult nephrology.     Review of Systems: As per HPI otherwise 10 point review of systems negative.  Review of Systems Otherwise negative except as per HPI, including: General: Denies fever, chills, night sweats or unintended weight loss. Resp: Denies cough, wheezing, shortness of breath. Cardiac: Denies chest pain, palpitations, orthopnea, paroxysmal nocturnal dyspnea. GI: Denies diarrhea or constipation GU: Denies dysuria, frequency, hesitancy or incontinence MS: Denies muscle aches, joint pain or swelling Neuro: Denies headache, neurologic deficits (focal weakness, numbness, tingling), abnormal gait Psych: Denies anxiety, depression, SI/HI/AVH Skin: Denies new rashes or lesions ID: Denies sick contacts, exotic exposures,  travel  Past Medical History:  Diagnosis Date   Acute respiratory failure with hypoxemia ([place]) 12/07/2021   Acute respiratory failure with hypoxia ([place]) 12/07/2021   Asthma    Colitis    Diabetes ([place])    Encephalopathy acute 09/20/2021   Glottic and subglottic edema 2022   Hypertension     Past Surgical History:  Procedure Laterality Date   TRACHEOSTOMY REVISION  12/16/2021   Procedure: TRACHEOSTOMY EXCHANGE;  Surgeon: Juanito Doom, MD;  Location[facility] END[facility];  Service: Cardiopulmonary;;    SOCIAL HISTORY:  reports that she has quit smoking. Her smoking use included cigarettes. She has never used smokeless tobacco. She reports that she does not drink alcohol and does not use drugs.  No Known Allergies  FAMILY HISTORY: Family History  Family history unknown: Yes     Prior to Admission medications   Medication Sig Start Date End Date Taking? Authorizing Provider  amLODipine (NORVASC) 10 MG tablet Take 1 tablet (10 mg total) by mouth at bedtime. 04/01/22  Yes Nche, Charlene Brooke, NP  atorvastatin (LIPITOR) 20 MG tablet Take 1 tablet (20 mg total) by mouth daily. 04/01/22  Yes Nche, Charlene Brooke, NP  azelastine (ASTELIN) 0.1 % nasal spray Place 2 sprays into both nostrils 2 (two) times daily. Use in each nostril as directed   Yes [provider]  carvedilol (COREG) 25 MG tablet Take 1 tablet (25 mg total) by mouth 2 (two) times daily. 04/01/22  Yes Nche, Charlene Brooke, NP  cloNIDine (CATAPRES) 0.1 MG tablet Take 1 tablet (0.1 mg total) by mouth 2 (two) times daily. 07/05/22  Yes Nche, Charlene Brooke, NP  DULoxetine (CYMBALTA) 60 MG capsule Take  1 capsule (60 mg total) by mouth daily. 04/01/22  Yes Nche, Charlene Brooke, NP  fluticasone (FLONASE) 50 MCG/ACT nasal spray Place 2 sprays into both nostrils daily. 04/12/22  Yes Nche, Charlene Brooke, NP  hydrALAZINE (APRESOLINE) 100 MG tablet Take 1 tablet (100 mg total) by mouth 3 (three) times daily. 04/01/22  Yes Nche, Charlene Brooke, NP  levocetirizine (XYZAL) 5 MG tablet Take 1 tablet (5 mg total) by mouth every evening. 04/12/22  Yes Nche, Charlene Brooke, NP  losartan (COZAAR) 50 MG tablet Take 1 tablet (50 mg total) by mouth daily. 04/01/22  Yes Nche, Charlene Brooke, NP  pantoprazole (PROTONIX) 40 MG tablet Take 1 tablet (40 mg total) by mouth 2 (two) times daily. Take twice a day for 1 month, then go back to daily Patient taking differently: Take 40 mg by mouth daily as needed (GERD). 08/11/22 10/05/22 Yes Erick Colace, NP  promethazine (PHENERGAN) 25 MG tablet Take 1 tablet (25 mg total) by mouth every 6 (six) hours as needed for nausea or vomiting. 02/17/22  Yes Barb Merino, MD  spironolactone (ALDACTONE) 25 MG tablet Take 0.5 tablets (12.5 mg total) by mouth daily. Patient taking differently: Take 25 mg by mouth daily. 07/05/22  Yes Nche, Charlene Brooke, NP  traZODone (DESYREL) 50 MG tablet Take 1 tablet (50 mg total) by mouth at bedtime. 07/22/22  Yes Nche, Charlene Brooke, NP  TRESIBA FLEXTOUCH 200 UNIT/ML FlexTouch Pen Inject 32 Units into the skin 2 (two) times daily. 04/01/22  Yes Nche, Charlene Brooke, NP  Continuous Blood Gluc Sensor (FREESTYLE LIBRE 14 DAY SENSOR) MISC 1 Units by Does not apply route every 14 (fourteen) days. 07/04/22   Nche, Charlene Brooke, NP  glucose blood (ONETOUCH VERIO) test strip Use as instructed 04/01/22   Nche, Charlene Brooke, NP  Insulin Pen Needle 32G X 4 MM MISC Use to inject insulin up to 4 times daily as directed 04/01/22   Nche, Charlene Brooke, NP  insulin aspart (NOVOLOG) 100 UNIT/ML injection Insulin (Novolog) sliding scale for additional Novolog: administer 17minutes before meals. Sugar <150 - no units Sugar >151-200 - 4 units Sugar >200-250 - 6 units Sugar >251-300 - 10 units and call office. Patient taking differently: Inject 4-10 Units into the skin See admin instructions. Insulin (Novolog) sliding scale for additional Novolog: administer 29minutes before meals. Sugar <150 - no  units Sugar >151-200 - 4 units Sugar >200-250 - 6 units Sugar >251-300 - 10 units and call office. 11/22/21 12/17/21  Flossie Buffy, NP    Physical Exam: Vitals:   10/05/22 1630 10/05/22 1715 10/05/22 1730 10/05/22 1745  BP: (!) 210/99 (!) 202/99 (!) 205/99 (!) 206/94  Pulse: (!) 110 (!) 107 (!) 108 (!) 107  Resp: 11 18 18  (!) 21  Temp:      TempSrc:      SpO2: 99% 97% 98% 98%      Constitutional: NAD, calm, comfortable, tracheostomy in place Eyes: PERRL, lids and conjunctivae normal ENMT: Mucous membranes are moist.  Neck: normal, supple, no masses, no thyromegaly Respiratory: Clear to auscultation bilaterally Cardiovascular: Sinus tachycardia Abdomen: no tenderness, no masses palpated. No hepatosplenomegaly. Bowel sounds positive.  Musculoskeletal: no clubbing / cyanosis. No joint deformity upper and lower extremities. Good ROM, no contractures. Normal muscle tone.  Skin: no rashes, lesions, ulcers. No induration Neurologic: CN 2-12 grossly intact. Sensation intact, DTR normal. Strength 5/5 in all 4.  Psychiatric: Normal judgment and insight. Alert and oriented x 3. Normal mood.  Labs on Admission: I have personally reviewed following labs and imaging studies  CBC: Recent Labs  Lab 10/05/22 1057  WBC 13.1*  NEUTROABS 11.4*  HGB 9.3*  HCT 29.7*  MCV 91.7  PLT 326   Basic Metabolic Panel: Recent Labs  Lab 10/05/22 1057  NA 143  K 4.9  CL 108  CO2 20*  GLUCOSE 250*  BUN 67*  CREATININE 7.56*  CALCIUM 9.1   GFR: CrCl cannot be calculated (Unknown ideal weight.). Liver Function Tests: Recent Labs  Lab 10/05/22 1057  AST 30  ALT 17  ALKPHOS 114  BILITOT 0.7  PROT 7.1  ALBUMIN 3.4*   Recent Labs  Lab 10/05/22 1057  LIPASE 40   No results for input(s): "AMMONIA" in the last 168 hours. Coagulation Profile: No results for input(s): "INR", "PROTIME" in the last 168 hours. Cardiac Enzymes: No results for input(s): "CKTOTAL", "CKMB",  "CKMBINDEX", "TROPONINI" in the last 168 hours. BNP (last 3 results) Recent Labs    04/01/22 1147  PROBNP 306*   HbA1C: No results for input(s): "HGBA1C" in the last 72 hours. CBG: No results for input(s): "GLUCAP" in the last 168 hours. Lipid Profile: No results for input(s): "CHOL", "HDL", "LDLCALC", "TRIG", "CHOLHDL", "LDLDIRECT" in the last 72 hours. Thyroid Function Tests: No results for input(s): "TSH", "T4TOTAL", "FREET4", "T3FREE", "THYROIDAB" in the last 72 hours. Anemia Panel: No results for input(s): "VITAMINB12", "FOLATE", "FERRITIN", "TIBC", "IRON", "RETICCTPCT" in the last 72 hours. Urine analysis:    Component Value Date/Time   COLORURINE YELLOW 10/05/2022 [place] 10/05/2022 1053   LABSPEC 1.012 10/05/2022 1053   PHURINE 6.0 10/05/2022 1053   GLUCOSEU >=500 (A) 10/05/2022 1053   HGBUR SMALL (A) 10/05/2022 1053   BILIRUBINUR NEGATIVE 10/05/2022 1053   KETONESUR 5 (A) 10/05/2022 1053   PROTEINUR >=300 (A) 10/05/2022 1053   NITRITE NEGATIVE 10/05/2022 1053   LEUKOCYTESUR NEGATIVE 10/05/2022 1053   Sepsis Labs: !!!!!!!!!!!!!!!!!!!!!!!!!!!!!!!!!!!!!!!!!!!! @LABRCNTIP (procalcitonin:4,lacticidven:4) )No results found for this or any previous visit (from the past 240 hour(s)).   Radiological Exams on Admission: CT ABDOMEN PELVIS WO CONTRAST  Result Date: 10/05/2022 CLINICAL DATA:  Right lower quadrant abdominal pain. EXAM: CT ABDOMEN AND PELVIS WITHOUT CONTRAST TECHNIQUE: Multidetector CT imaging of the abdomen and pelvis was performed following the standard protocol without IV contrast. RADIATION DOSE REDUCTION: This exam was performed according to the departmental dose-optimization program which includes automated exposure control, adjustment of the mA and/or kV according to patient size and/or use of iterative reconstruction technique. COMPARISON:  Abdominopelvic CT 02/13/2022 FINDINGS: Lower chest: Interval improved aeration of the lung bases with  mild subsegmental atelectasis. Stable small pericardial effusion and small hiatal hernia. Hepatobiliary: The liver is normal in density without suspicious focal abnormality. No evidence of gallstones, gallbladder wall thickening or biliary dilatation. The gallbladder appears unchanged. Pancreas: Unremarkable. No pancreatic ductal dilatation or surrounding inflammatory changes. Spleen: Normal in size without focal abnormality. Adrenals/Urinary Tract: Both adrenal glands appear stable. Unchanged perinephric soft tissue stranding bilaterally. No hydronephrosis or ureteral calculus. The bladder appears unremarkable for its degree of distention. Stomach/Bowel: No enteric contrast administered. As above, small hiatal hernia with mild distal esophageal wall thickening. The stomach appears unremarkable for its degree of distention. No evidence of bowel distension, wall thickening or focal surrounding inflammation. The appendix is not clearly seen and may be surgically absent. Vascular/Lymphatic: Stable small retroperitoneal lymph nodes, likely reactive. Mild aortic and branch vessel atherosclerosis without evidence of aneurysm. Reproductive: Hysterectomy.  No adnexal mass. Other:  Unchanged perinephric and retroperitoneal soft tissue stranding without focal fluid collection or ascites. No free air. Mild edema in the subcutaneous fat. No evidence of abdominal wall hernia. Musculoskeletal: No acute or significant osseous findings. Mild thoracolumbar spondylosis. IMPRESSION: 1. No acute findings or explanation for the patient's symptoms. 2. Stable nonspecific perinephric and retroperitoneal soft tissue stranding without focal fluid collection or hydronephrosis. 3. Stable small hiatal hernia with mild distal esophageal wall thickening. 4.  Aortic Atherosclerosis (ICD10-I70.0). Electronically Signed   By: Richardean Sale M.D.   On: 10/05/2022 17:05     All images have been reviewed by me personally.  EKG: Independently  reviewed.  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  Suspect from nausea vomiting, prerenal.  We will give IV fluids, monitor urine output.  Advised nursing staff to place Foley catheter for accurate input and output.  ER provider to consult nephrology.  Monitor electrolytes, potassium and bicarb acceptable range at this time. -Holding nephrotoxic drugs including losartan and Aldactone  Nausea and vomiting - Unclear etiology.  LFTs, lipase are normal.  CT abdomen pelvis without contrast is unremarkable.  Antiemetics as needed, supportive care.  No gallstones seen on the CAT scan.  Mild hyperglycemia but no evidence of DKA, does have glucosuria  Insulin-dependent diabetes mellitus type 2 - While in acute kidney injury, will order lower dose of insulin.  Semglee 10 units twice daily, sliding scale and Accu-Chek.  Will adjust further as necessary  Tracheostomy dependence - Patient follows outpatient pulmonary.  Recently has undergone work-up for concerns of vocal cord dysfunction/subglottic stenosis.  ENT and pulmonary recommending periodic trach change, last trach change 2 days ago by outpatient pulmonary.  We will closely monitor here, if necessary we can get pulmonary involved  Essential hypertension, uncontrolled - Norvasc, Coreg, clonidine, hydralazine ordered.  IV as needed ordered  GERD - PPI     DVT prophylaxis: Subcu heparin Code Status: Full code Family Communication: None Consults called: ED provider to call  nephrology Admission status: Inpatient admission  Status is: Inpatient Remains inpatient appropriate because: Acute kidney injury   Time Spent: 65 minutes.  >50% of the time was devoted to discussing the patients care, assessment, plan and disposition with other care givers along with counseling the patient about the risks and benefits of treatment.    Kristain Hu Arsenio Loader MD [facility]  If 7PM-7AM, please contact night-coverage   10/05/2022, 6:01 PM

## 2022-10-05 NOTE — ED Provider Notes (Signed)
[facility] EMERGENCY DEPARTMENT Provider Note   CSN: [id number] Arrival date & time: 10/05/22  1033     History  Chief Complaint  Patient presents with   Abdominal Pain   Emesis   Nausea    Maureen Jones is a 56 y.o. female.  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  Patient states that she has had nausea and vomiting as well as diarrhea.  She denies any black or bloody stools.  She states that she is having a right lower quadrant/right groin pain.  She states that she still feels like she has been urinating normally with some mild dysuria but denies any hematuria.  She denies any fevers or chills.  She denies any cough, congestion or sore throat.  She states that she did have some acid reflux type pain this week.  She denies any known sick contacts.  She states she has had a hysterectomy but denies any other abdominal surgeries.  The history is provided by the patient.  Abdominal Pain Associated symptoms: vomiting   Emesis Associated symptoms: abdominal pain        Home Medications Prior to Admission medications   Medication Sig Start Date End Date Taking? Authorizing Provider  amLODipine (NORVASC) 10 MG tablet Take 1 tablet (10 mg total) by mouth at bedtime. 04/01/22  Yes Nche, Charlene Brooke, NP  atorvastatin (LIPITOR) 20 MG tablet Take 1 tablet (20 mg total) by mouth daily. 04/01/22  Yes Nche, Charlene Brooke, NP  azelastine (ASTELIN) 0.1 % nasal spray Place 2 sprays into both nostrils 2 (two) times daily. Use in each nostril as directed   Yes [provider]  carvedilol (COREG) 25 MG tablet Take 1 tablet (25 mg total) by mouth 2 (two) times daily. 04/01/22  Yes Nche, Charlene Brooke, NP  cloNIDine (CATAPRES) 0.1 MG tablet Take 1 tablet (0.1 mg total) by  mouth 2 (two) times daily. 07/05/22  Yes Nche, Charlene Brooke, NP  DULoxetine (CYMBALTA) 60 MG capsule Take 1 capsule (60 mg total) by mouth daily. 04/01/22  Yes Nche, Charlene Brooke, NP  fluticasone (FLONASE) 50 MCG/ACT nasal spray Place 2 sprays into both nostrils daily. 04/12/22  Yes Nche, Charlene Brooke, NP  hydrALAZINE (APRESOLINE) 100 MG tablet Take 1 tablet (100 mg total) by mouth 3 (three) times daily. 04/01/22  Yes Nche, Charlene Brooke, NP  levocetirizine (XYZAL) 5 MG tablet Take 1 tablet (5 mg total) by mouth every evening. 04/12/22  Yes Nche, Charlene Brooke, NP  losartan (COZAAR) 50 MG tablet Take 1 tablet (50 mg total) by mouth daily. 04/01/22  Yes Nche, Charlene Brooke, NP  pantoprazole (PROTONIX) 40 MG tablet Take 1 tablet (40 mg total) by mouth 2 (two) times daily. Take twice a day for 1 month, then go back to daily Patient taking differently: Take 40 mg by mouth daily as needed (GERD). 08/11/22 10/05/22 Yes Erick Colace, NP  promethazine (PHENERGAN) 25 MG tablet Take 1 tablet (25 mg total) by mouth every 6 (six) hours as needed for nausea or vomiting. 02/17/22  Yes Barb Merino, MD  spironolactone (ALDACTONE) 25 MG tablet Take 0.5 tablets (12.5 mg total) by mouth daily. Patient taking differently: Take 25 mg by mouth daily. 07/05/22  Yes Nche, Charlene Brooke, NP  traZODone (DESYREL) 50 MG tablet Take 1  tablet (50 mg total) by mouth at bedtime. 07/22/22  Yes Nche, Charlene Brooke, NP  TRESIBA FLEXTOUCH 200 UNIT/ML FlexTouch Pen Inject 32 Units into the skin 2 (two) times daily. 04/01/22  Yes Nche, Charlene Brooke, NP  Continuous Blood Gluc Sensor (FREESTYLE LIBRE 14 DAY SENSOR) MISC 1 Units by Does not apply route every 14 (fourteen) days. 07/04/22   Nche, Charlene Brooke, NP  glucose blood (ONETOUCH VERIO) test strip Use as instructed 04/01/22   Nche, Charlene Brooke, NP  Insulin Pen Needle 32G X 4 MM MISC Use to inject insulin up to 4 times daily as directed 04/01/22   Nche, Charlene Brooke, NP  insulin aspart  (NOVOLOG) 100 UNIT/ML injection Insulin (Novolog) sliding scale for additional Novolog: administer 78minutes before meals. Sugar <150 - no units Sugar >151-200 - 4 units Sugar >200-250 - 6 units Sugar >251-300 - 10 units and call office. Patient taking differently: Inject 4-10 Units into the skin See admin instructions. Insulin (Novolog) sliding scale for additional Novolog: administer 63minutes before meals. Sugar <150 - no units Sugar >151-200 - 4 units Sugar >200-250 - 6 units Sugar >251-300 - 10 units and call office. 11/22/21 12/17/21  Nche, Charlene Brooke, NP      Allergies    Patient has no known allergies.    Review of Systems   Review of Systems  Gastrointestinal:  Positive for abdominal pain and vomiting.    Physical Exam Updated Vital Signs BP (!) 206/94   Pulse (!) 107   Temp 98.4 F (36.9 C) (Oral)   Resp (!) 21   SpO2 98%  Physical Exam Vitals and nursing note reviewed.  Constitutional:      General: She is not in acute distress.    Appearance: She is well-developed. She is obese.  HENT:     Head: Normocephalic and atraumatic.     Mouth/Throat:     Pharynx: Oropharynx is clear.     Comments: Dry lips moist mucous membranes Eyes:     Pupils: Pupils are equal, round, and reactive to light.  Cardiovascular:     Rate and Rhythm: Regular rhythm. Tachycardia present.     Heart sounds: Normal heart sounds.  Pulmonary:     Effort: Pulmonary effort is normal.     Breath sounds: Normal breath sounds.     Comments: Trach in place on trach collar Abdominal:     General: Abdomen is flat.     Palpations: Abdomen is soft.     Tenderness: There is abdominal tenderness in the right lower quadrant. There is no right CVA tenderness, left CVA tenderness, guarding or rebound.  Skin:    General: Skin is warm and dry.  Neurological:     General: No focal deficit present.     Mental Status: She is alert and oriented to person, place, and time.  Psychiatric:        Mood and  Affect: Mood normal.        Behavior: Behavior normal.     ED Results / Procedures / Treatments   Labs (all labs ordered are listed, but only abnormal results are displayed) Labs Reviewed  CBC WITH DIFFERENTIAL/PLATELET - Abnormal; Notable for the following components:      Result Value   WBC 13.1 (*)    RBC 3.24 (*)    Hemoglobin 9.3 (*)    HCT 29.7 (*)    Neutro Abs 11.4 (*)    All other components within normal limits  COMPREHENSIVE METABOLIC PANEL -  Abnormal; Notable for the following components:   CO2 20 (*)    Glucose, Bld 250 (*)    BUN 67 (*)    Creatinine, Ser 7.56 (*)    Albumin 3.4 (*)    GFR, Estimated 6 (*)    All other components within normal limits  URINALYSIS, ROUTINE W REFLEX MICROSCOPIC - Abnormal; Notable for the following components:   Glucose, UA >=500 (*)    Hgb urine dipstick SMALL (*)    Ketones, ur 5 (*)    Protein, ur >=300 (*)    All other components within normal limits  RESP PANEL BY RT-PCR (FLU A&B, COVID) ARPGX2  LIPASE, BLOOD  HIV ANTIBODY (ROUTINE TESTING W REFLEX)  CBC  CREATININE, SERUM  BASIC METABOLIC PANEL  CBC  MAGNESIUM    EKG EKG Interpretation  Date/Time:  Wednesday October 05 2022 17:22:26 EDT Ventricular Rate:  108 PR Interval:  147 QRS Duration: 137 QT Interval:  381 QTC Calculation: 511 R Axis:   44 Text Interpretation: Sinus tachycardia Probable left atrial enlargement Right bundle branch block No significant change since last tracing Confirmed by Leanord Asal ([id number]) on 10/05/2022 5:32:43 PM  Radiology CT ABDOMEN PELVIS WO CONTRAST  Result Date: 10/05/2022 CLINICAL DATA:  Right lower quadrant abdominal pain. EXAM: CT ABDOMEN AND PELVIS WITHOUT CONTRAST TECHNIQUE: Multidetector CT imaging of the abdomen and pelvis was performed following the standard protocol without IV contrast. RADIATION DOSE REDUCTION: This exam was performed according to the departmental dose-optimization program which includes automated  exposure control, adjustment of the mA and/or kV according to patient size and/or use of iterative reconstruction technique. COMPARISON:  Abdominopelvic CT 02/13/2022 FINDINGS: Lower chest: Interval improved aeration of the lung bases with mild subsegmental atelectasis. Stable small pericardial effusion and small hiatal hernia. Hepatobiliary: The liver is normal in density without suspicious focal abnormality. No evidence of gallstones, gallbladder wall thickening or biliary dilatation. The gallbladder appears unchanged. Pancreas: Unremarkable. No pancreatic ductal dilatation or surrounding inflammatory changes. Spleen: Normal in size without focal abnormality. Adrenals/Urinary Tract: Both adrenal glands appear stable. Unchanged perinephric soft tissue stranding bilaterally. No hydronephrosis or ureteral calculus. The bladder appears unremarkable for its degree of distention. Stomach/Bowel: No enteric contrast administered. As above, small hiatal hernia with mild distal esophageal wall thickening. The stomach appears unremarkable for its degree of distention. No evidence of bowel distension, wall thickening or focal surrounding inflammation. The appendix is not clearly seen and may be surgically absent. Vascular/Lymphatic: Stable small retroperitoneal lymph nodes, likely reactive. Mild aortic and branch vessel atherosclerosis without evidence of aneurysm. Reproductive: Hysterectomy.  No adnexal mass. Other: Unchanged perinephric and retroperitoneal soft tissue stranding without focal fluid collection or ascites. No free air. Mild edema in the subcutaneous fat. No evidence of abdominal wall hernia. Musculoskeletal: No acute or significant osseous findings. Mild thoracolumbar spondylosis. IMPRESSION: 1. No acute findings or explanation for the patient's symptoms. 2. Stable nonspecific perinephric and retroperitoneal soft tissue stranding without focal fluid collection or hydronephrosis. 3. Stable small hiatal hernia  with mild distal esophageal wall thickening. 4.  Aortic Atherosclerosis (ICD10-I70.0). Electronically Signed   By: Richardean Sale M.D.   On: 10/05/2022 17:05    Procedures Procedures    Medications Ordered in ED Medications  heparin injection 5,000 Units (has no administration in time range)  0.9 %  sodium chloride infusion (has no administration in time range)  ipratropium-albuterol (DUONEB) 0.5-2.5 (3) MG/3ML nebulizer solution 3 mL (has no administration in time range)  hydrALAZINE (APRESOLINE) injection 10  mg (has no administration in time range)  metoprolol tartrate (LOPRESSOR) injection 5 mg (has no administration in time range)  traZODone (DESYREL) tablet 50 mg (has no administration in time range)  senna-docusate (Senokot-S) tablet 1 tablet (has no administration in time range)  guaiFENesin (ROBITUSSIN) 100 MG/5ML liquid 5 mL (has no administration in time range)  ondansetron (ZOFRAN) injection 4 mg (has no administration in time range)  atorvastatin (LIPITOR) tablet 20 mg (has no administration in time range)  hydrALAZINE (APRESOLINE) tablet 100 mg (has no administration in time range)  traZODone (DESYREL) tablet 50 mg (has no administration in time range)  pantoprazole (PROTONIX) EC tablet 40 mg (has no administration in time range)  insulin glargine-yfgn (SEMGLEE) injection 10 Units (has no administration in time range)  insulin aspart (novoLOG) injection 0-9 Units (has no administration in time range)  insulin aspart (novoLOG) injection 0-5 Units (has no administration in time range)  amLODipine (NORVASC) tablet 10 mg (has no administration in time range)  carvedilol (COREG) tablet 25 mg (has no administration in time range)  cloNIDine (CATAPRES) tablet 0.1 mg (has no administration in time range)  lactated ringers bolus 1,000 mL (0 mLs Intravenous Stopped 10/05/22 1826)  morphine (PF) 4 MG/ML injection 4 mg (4 mg Intravenous Given 10/05/22 1536)  ondansetron (ZOFRAN)  injection 4 mg (4 mg Intravenous Given 10/05/22 1535)  amLODipine (NORVASC) tablet 10 mg (10 mg Oral Given 10/05/22 1733)  carvedilol (COREG) tablet 25 mg (25 mg Oral Given 10/05/22 1733)  cloNIDine (CATAPRES) tablet 0.1 mg (0.1 mg Oral Given 10/05/22 1733)  hydrALAZINE (APRESOLINE) tablet 100 mg (100 mg Oral Given 10/05/22 1728)    ED Course/ Medical Decision Making/ A&P Clinical Course as of 10/05/22 1837  Wed Oct 05, 2022  1709 CTAP without explanation for patient's acute pain. She will require admission for her AKI on CKD. [VK]  7412 Patient signed out to hospitalist, nephrology will also be contacted for consult.  [VK]    Clinical Course User Index [VK] Kemper Durie, DO                           Medical Decision Making This patient presents to the ED with chief complaint(s) of nausea, vomiting and abdominal pain with pertinent past medical history of CKD, hypertension, diabetes and chronic trach which further complicates the presenting complaint. The complaint involves an extensive differential diagnosis and also carries with it a high risk of complications and morbidity.    The differential diagnosis includes appendicitis, UTI, gastroenteritis, dehydration, electrolyte abnormality, atypical ACS, SBO less likely as patient has had diarrhea viral syndrome  Additional history obtained: Additional history obtained from N/A Records reviewed Primary Care Documents  ED Course and Reassessment: Patient was initially evaluated by provider in triage and had labs and urine performed.  Labs showed an AKI with a creatinine of 7.5 from baseline creatinine around 4.5.  With she has a mild leukocytosis, urine is negative for UTI.  CT abdomen and pelvis without contrast due to her CKD will be performed to evaluate for intra-abdominal infections.  She will be started on IV fluids and given pain and nausea control.  Independent labs interpretation:  The following labs were independently  interpreted: AKI on CKD, mild leukocytosis  Independent visualization of imaging: - I independently visualized the following imaging with scope of interpretation limited to determining acute life threatening conditions related to emergency care: CT AP, which revealed no acute disease  Consultation: -  Consulted or discussed management/test interpretation w/ external professional: Hospitalist  Consideration for admission or further workup: Patient requires admission for further management of her AKI on CKD likely due to her nausea, vomiting and diarrhea Social Determinants of health: N/A    Risk Prescription drug management. Decision regarding hospitalization.          Final Clinical Impression(s) / ED Diagnoses Final diagnoses:  Gastroenteritis  AKI (acute kidney injury) ([place])  Hypertension, unspecified type    Rx / DC Orders ED Discharge Orders     None         Kemper Durie, DO 10/05/22 [id number]

## 2022-10-05 NOTE — ED Provider Triage Note (Signed)
Emergency Medicine Provider Triage Evaluation Note  Kayleann Mccaffery , a 56 y.o. female  was evaluated in triage.  Pt complains of right lower quadrant abdominal pain which began approximately 8 days ago.  She also endorses nausea and vomiting/dry heaving.  She denies diarrhea, constipation, chest pain.  Patient does have a trach and has some baseline difficulty speaking but denies any shortness of breath at this time.  She states the pain began suddenly and is sharp in nature.  Review of Systems  Positive: Abdominal pain, nausea, vomiting Negative: Shortness of breath  Physical Exam  There were no vitals taken for this visit. Gen:   Awake, no distress   Resp:  Normal effort  MSK:   Moves extremities without difficulty  Other:  Right lower quadrant tenderness to palpation  Medical Decision Making  Medically screening exam initiated at 10:48 AM.  Appropriate orders placed.  Vina Byrd was informed that the remainder of the evaluation will be completed by another provider, this initial triage assessment does not replace that evaluation, and the importance of remaining in the ED until their evaluation is complete.     Dorothyann Peng, PA-C 10/05/22 1051

## 2022-10-05 NOTE — ED Notes (Signed)
Pt resting comfortably in bed. Denies further needs at this time and call bell is within reach

## 2022-10-06 DIAGNOSIS — N179 Acute kidney failure, unspecified: Secondary | ICD-10-CM | POA: Diagnosis not present

## 2022-10-06 LAB — CBG MONITORING, ED: Glucose-Capillary: 173 mg/dL — ABNORMAL HIGH (ref 70–99)

## 2022-10-06 LAB — CREATININE, SERUM
Creatinine, Ser: 6.99 mg/dL — ABNORMAL HIGH (ref 0.44–1.00)
GFR, Estimated: 6 mL/min — ABNORMAL LOW (ref >60)

## 2022-10-06 LAB — BASIC METABOLIC PANEL
Anion gap: 12 (ref 5–15)
BUN: 63 mg/dL — ABNORMAL HIGH (ref 6–20)
CO2: 19 mmol/L — ABNORMAL LOW (ref 22–32)
Calcium: 8.3 mg/dL — ABNORMAL LOW (ref 8.9–10.3)
Chloride: 108 mmol/L (ref 98–111)
Creatinine, Ser: 6.85 mg/dL — ABNORMAL HIGH (ref 0.44–1.00)
GFR, Estimated: 7 mL/min — ABNORMAL LOW (ref >60)
Glucose, Bld: 200 mg/dL — ABNORMAL HIGH (ref 70–99)
Potassium: 4.7 mmol/L (ref 3.5–5.1)
Sodium: 139 mmol/L (ref 135–145)

## 2022-10-06 LAB — HIV ANTIBODY (ROUTINE TESTING W REFLEX): HIV Screen 4th Generation wRfx: NONREACTIVE

## 2022-10-06 LAB — CBC
HCT: 28.3 % — ABNORMAL LOW (ref 36.0–46.0)
Hemoglobin: 8.9 g/dL — ABNORMAL LOW (ref 12.0–15.0)
MCH: 28.8 pg (ref 26.0–34.0)
MCHC: 31.4 g/dL (ref 30.0–36.0)
MCV: 91.6 fL (ref 80.0–100.0)
Platelets: 239 10*3/uL (ref 150–400)
RBC: 3.09 MIL/uL — ABNORMAL LOW (ref 3.87–5.11)
RDW: 13.2 % (ref 11.5–15.5)
WBC: 16.9 10*3/uL — ABNORMAL HIGH (ref 4.0–10.5)
nRBC: 0 % (ref 0.0–0.2)

## 2022-10-06 LAB — GLUCOSE, CAPILLARY
Glucose-Capillary: 174 mg/dL — ABNORMAL HIGH (ref 70–99)
Glucose-Capillary: 141 mg/dL — ABNORMAL HIGH (ref 70–99)
Glucose-Capillary: 126 mg/dL — ABNORMAL HIGH (ref 70–99)

## 2022-10-06 LAB — MAGNESIUM: Magnesium: 1.3 mg/dL — ABNORMAL LOW (ref 1.7–2.4)

## 2022-10-06 MED ORDER — MAGNESIUM SULFATE 4 GM/100ML IV SOLN
4.0000 g | Freq: Once | INTRAVENOUS | Status: AC
  Administered 2022-10-06: 4 g via INTRAVENOUS
  Filled 2022-10-06: qty 100

## 2022-10-06 MED ORDER — PANTOPRAZOLE SODIUM 40 MG PO TBEC
40.0000 mg | DELAYED_RELEASE_TABLET | Freq: Two times a day (BID) | ORAL | Status: DC
  Administered 2022-10-06 – 2022-10-14 (×16): 40 mg via ORAL
  Filled 2022-10-06 (×16): qty 1

## 2022-10-06 MED ORDER — FAMOTIDINE 20 MG PO TABS
20.0000 mg | ORAL_TABLET | Freq: Every day | ORAL | Status: DC
  Administered 2022-10-06 – 2022-10-13 (×8): 20 mg via ORAL
  Filled 2022-10-06 (×9): qty 1

## 2022-10-06 MED ORDER — ALUM & MAG HYDROXIDE-SIMETH 200-200-20 MG/5ML PO SUSP
30.0000 mL | Freq: Once | ORAL | Status: AC
  Administered 2022-10-06: 30 mL via ORAL
  Filled 2022-10-06: qty 30

## 2022-10-06 MED ORDER — SODIUM CHLORIDE 0.9 % IV SOLN: INTRAVENOUS | Status: DC

## 2022-10-06 MED ORDER — INSULIN GLARGINE-YFGN 100 UNIT/ML ~~LOC~~ SOLN
16.0000 [IU] | Freq: Two times a day (BID) | SUBCUTANEOUS | Status: DC
  Administered 2022-10-06 – 2022-10-10 (×9): 16 [IU] via SUBCUTANEOUS
  Filled 2022-10-06 (×13): qty 0.16

## 2022-10-06 NOTE — ED Notes (Signed)
Pt awake and complaining of feeling nauseous again, PRN Zofran given

## 2022-10-06 NOTE — Progress Notes (Signed)
PROGRESS NOTE    Maureen Jones  OIN:[id number] DOB: [birth year] DOA: 10/05/2022 PCP: Flossie Buffy, NP   Brief Narrative:  56 y.o. 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 She was admitted for acute kidney injury, slowly improving with IV fluids.   Assessment & Plan:  Principal Problem:   AKI (acute kidney injury) ([place]) Active Problems:   Vocal cord dysfunction   Tracheostomy dependence (HCC)   Resistant hypertension   OSA (obstructive sleep apnea)   DM (diabetes mellitus) (HCC)   CKD (chronic kidney disease) stage 5, GFR less than 15 ml/min (HCC)   Nausea vomiting and diarrhea   Morbid (severe) obesity due to excess calories (HCC)    Acute kidney injury on CKD stage V -Baseline creatinine 4.0, admission creatinine 7.56.  Suspect from nausea vomiting, prerenal.  Discussed briefly with nephrology, continue IV fluids and monitor.  Creatinine appears to be slowly improving this morning.  Continue to hold nephrotoxic drugs including losartan and Aldactone.   Nausea and vomiting with heartburn type symptoms - Unclear etiology.  LFTs, lipase are normal.  CT abdomen pelvis without contrast is unremarkable.  Antiemetics as needed, supportive care.  No gallstones seen on the CAT scan.  Mild hyperglycemia but no evidence of DKA, does have glucosuria.  We will place her on PPI, Pepcid   Insulin-dependent diabetes mellitus type 2 - While in acute kidney injury, will order lower dose of insulin.  Increase Semglee to 16 units twice daily.  Sliding scale and Accu-Cheks   Tracheostomy dependence - Patient follows outpatient pulmonary.  Recently has undergone work-up for concerns of vocal cord dysfunction/subglottic stenosis.  ENT and pulmonary recommending periodic trach change,  last trach change 2 days ago by outpatient pulmonary.  We will closely monitor here, if necessary we can get pulmonary involved   Essential hypertension, uncontrolled - Norvasc, Coreg, clonidine, hydralazine ordered.  IV as needed ordered   GERD - PPI    DVT prophylaxis: SQ. heparin Code Status: Full code Family Communication:    Maintain hospital stay until renal function improves Nutritional status          There is no height or weight on file to calculate BMI.         Subjective: Seen and examined at bedside, reporting of some heartburn type of symptoms.  No other complaints   Examination:  General exam: Appears calm and comfortable, tracheostomy in place Respiratory system: Clear to auscultation. Respiratory effort normal. Cardiovascular system: S1 & S2 heard, RRR. No JVD, murmurs, rubs, gallops or clicks. No pedal edema. Gastrointestinal system: Abdomen is nondistended, soft and nontender. No organomegaly or masses felt. Normal bowel sounds heard. Central nervous system: Alert and oriented. No focal neurological deficits. Extremities: Symmetric 5 x 5 power. Skin: No rashes, lesions or ulcers Psychiatry: Judgement and insight appear normal. Mood & affect appropriate.     Objective: Vitals:   10/06/22 0748 10/06/22 0800 10/06/22 1023 10/06/22 1025  BP: (!) 194/94 (!) 186/97 (!) 206/85 (!) 183/85  Pulse: 97 98 97 96  Resp: (!) 25 17 16 19   Temp:      TempSrc:      SpO2: 98% 99% 99% 99%   No intake or output data in the 24 hours ending 10/06/22 1056 There were no vitals filed for this visit.   Data Reviewed:  CBC: Recent Labs  Lab 10/05/22 1057 10/06/22 0330  WBC 13.1* 16.9*  NEUTROABS 11.4*  --   HGB 9.3* 8.9*  HCT 29.7* 28.3*  MCV 91.7 91.6  PLT 240 161   Basic Metabolic Panel: Recent Labs  Lab 10/05/22 1057 10/06/22 0330  NA 143 139  K 4.9 4.7  CL 108 108  CO2 20* 19*  GLUCOSE 250* 200*  BUN 67* 63*  CREATININE 7.56* 6.85*   CALCIUM 9.1 8.3*  MG  --  1.3*   GFR: CrCl cannot be calculated (Unknown ideal weight.). Liver Function Tests: Recent Labs  Lab 10/05/22 1057  AST 30  ALT 17  ALKPHOS 114  BILITOT 0.7  PROT 7.1  ALBUMIN 3.4*   Recent Labs  Lab 10/05/22 1057  LIPASE 40   No results for input(s): "AMMONIA" in the last 168 hours. Coagulation Profile: No results for input(s): "INR", "PROTIME" in the last 168 hours. Cardiac Enzymes: No results for input(s): "CKTOTAL", "CKMB", "CKMBINDEX", "TROPONINI" in the last 168 hours. BNP (last 3 results) Recent Labs    04/01/22 1147  PROBNP 306*   HbA1C: No results for input(s): "HGBA1C" in the last 72 hours. CBG: Recent Labs  Lab 10/05/22 2201 10/06/22 1021  GLUCAP 243* 173*   Lipid Profile: No results for input(s): "CHOL", "HDL", "LDLCALC", "TRIG", "CHOLHDL", "LDLDIRECT" in the last 72 hours. Thyroid Function Tests: No results for input(s): "TSH", "T4TOTAL", "FREET4", "T3FREE", "THYROIDAB" in the last 72 hours. Anemia Panel: No results for input(s): "VITAMINB12", "FOLATE", "FERRITIN", "TIBC", "IRON", "RETICCTPCT" in the last 72 hours. Sepsis Labs: No results for input(s): "PROCALCITON", "LATICACIDVEN" in the last 168 hours.  Recent Results (from the past 240 hour(s))  Resp Panel by RT-PCR (Flu A&B, Covid) Anterior Nasal Swab     Status: None   Collection Time: 10/05/22  5:11 PM   Specimen: Anterior Nasal Swab  Result Value Ref Range Status   SARS Coronavirus 2 by RT PCR NEGATIVE NEGATIVE Final    Comment: (NOTE) SARS-CoV-2 target nucleic acids are NOT DETECTED.  The SARS-CoV-2 RNA is generally detectable in upper respiratory specimens during the acute phase of infection. The lowest concentration of SARS-CoV-2 viral copies this assay can detect is 138 copies/mL. A negative result does not preclude SARS-Cov-2 infection and should not be used as the sole basis for treatment or other patient management decisions. A negative result may  occur with  improper specimen collection/handling, submission of specimen other than nasopharyngeal swab, presence of viral mutation(s) within the areas targeted by this assay, and inadequate number of viral copies(<138 copies/mL). A negative result must be combined with clinical observations, patient history, and epidemiological information. The expected result is Negative.  Fact Sheet for Patients:  EntrepreneurPulse.com.au  Fact Sheet for Healthcare Providers:  IncredibleEmployment.be  This test is no t yet approved or cleared by the [place] FDA and  has been authorized for detection and/or diagnosis of SARS-CoV-2 by FDA under an Emergency Use Authorization (EUA). This EUA will remain  in effect (meaning this test can be used) for the duration of the COVID-19 declaration under Section 564(b)(1) of the Act, 21 U.S.C.section 360bbb-3(b)(1), unless the authorization is terminated  or revoked sooner.       Influenza A by PCR NEGATIVE NEGATIVE Final   Influenza B by PCR NEGATIVE NEGATIVE Final    Comment: (NOTE) The Xpert Xpress SARS-CoV-2/FLU/RSV plus assay is intended as an aid in the diagnosis of influenza from Nasopharyngeal swab specimens and should not be used as a  sole basis for treatment. Nasal washings and aspirates are unacceptable for Xpert Xpress SARS-CoV-2/FLU/RSV testing.  Fact Sheet for Patients: EntrepreneurPulse.com.au  Fact Sheet for Healthcare Providers: IncredibleEmployment.be  This test is not yet approved or cleared by the [place] FDA and has been authorized for detection and/or diagnosis of SARS-CoV-2 by FDA under an Emergency Use Authorization (EUA). This EUA will remain in effect (meaning this test can be used) for the duration of the COVID-19 declaration under Section 564(b)(1) of the Act, 21 U.S.C. section 360bbb-3(b)(1), unless the authorization is terminated  or revoked.  Performed at [facility], [place] [address], [place], [place] [address]          Radiology Studies: CT ABDOMEN PELVIS WO CONTRAST  Result Date: 10/05/2022 CLINICAL DATA:  Right lower quadrant abdominal pain. EXAM: CT ABDOMEN AND PELVIS WITHOUT CONTRAST TECHNIQUE: Multidetector CT imaging of the abdomen and pelvis was performed following the standard protocol without IV contrast. RADIATION DOSE REDUCTION: This exam was performed according to the departmental dose-optimization program which includes automated exposure control, adjustment of the mA and/or kV according to patient size and/or use of iterative reconstruction technique. COMPARISON:  Abdominopelvic CT 02/13/2022 FINDINGS: Lower chest: Interval improved aeration of the lung bases with mild subsegmental atelectasis. Stable small pericardial effusion and small hiatal hernia. Hepatobiliary: The liver is normal in density without suspicious focal abnormality. No evidence of gallstones, gallbladder wall thickening or biliary dilatation. The gallbladder appears unchanged. Pancreas: Unremarkable. No pancreatic ductal dilatation or surrounding inflammatory changes. Spleen: Normal in size without focal abnormality. Adrenals/Urinary Tract: Both adrenal glands appear stable. Unchanged perinephric soft tissue stranding bilaterally. No hydronephrosis or ureteral calculus. The bladder appears unremarkable for its degree of distention. Stomach/Bowel: No enteric contrast administered. As above, small hiatal hernia with mild distal esophageal wall thickening. The stomach appears unremarkable for its degree of distention. No evidence of bowel distension, wall thickening or focal surrounding inflammation. The appendix is not clearly seen and may be surgically absent. Vascular/Lymphatic: Stable small retroperitoneal lymph nodes, likely reactive. Mild aortic and branch vessel atherosclerosis without evidence of aneurysm. Reproductive:  Hysterectomy.  No adnexal mass. Other: Unchanged perinephric and retroperitoneal soft tissue stranding without focal fluid collection or ascites. No free air. Mild edema in the subcutaneous fat. No evidence of abdominal wall hernia. Musculoskeletal: No acute or significant osseous findings. Mild thoracolumbar spondylosis. IMPRESSION: 1. No acute findings or explanation for the patient's symptoms. 2. Stable nonspecific perinephric and retroperitoneal soft tissue stranding without focal fluid collection or hydronephrosis. 3. Stable small hiatal hernia with mild distal esophageal wall thickening. 4.  Aortic Atherosclerosis (ICD10-I70.0). Electronically Signed   By: Richardean Sale M.D.   On: 10/05/2022 17:05        Scheduled Meds:  alum & mag hydroxide-simeth  30 mL Oral Once   amLODipine  10 mg Oral Daily   atorvastatin  20 mg Oral Daily   carvedilol  25 mg Oral BID WC   cloNIDine  0.1 mg Oral BID   famotidine  20 mg Oral QHS   heparin  5,000 Units Subcutaneous Q8H   hydrALAZINE  100 mg Oral TID   insulin aspart  0-5 Units Subcutaneous QHS   insulin aspart  0-9 Units Subcutaneous TID WC   insulin glargine-yfgn  10 Units Subcutaneous BID   pantoprazole  40 mg Oral BID AC   traZODone  50 mg Oral QHS   Continuous Infusions:  sodium chloride 75 mL/hr at 10/06/22 1028   magnesium sulfate bolus IVPB  4 g (10/06/22 1033)     LOS: 1 day   Time spent= 35 mins    Semaj Coburn Arsenio Loader, MD [facility]  If 7PM-7AM, please contact night-coverage  10/06/2022, 10:56 AM

## 2022-10-07 ENCOUNTER — Inpatient Hospital Stay (HOSPITAL_COMMUNITY): Payer: Medicare Other

## 2022-10-07 DIAGNOSIS — N179 Acute kidney failure, unspecified: Secondary | ICD-10-CM | POA: Diagnosis not present

## 2022-10-07 DIAGNOSIS — R1115 Cyclical vomiting syndrome unrelated to migraine: Secondary | ICD-10-CM

## 2022-10-07 DIAGNOSIS — K219 Gastro-esophageal reflux disease without esophagitis: Secondary | ICD-10-CM

## 2022-10-07 LAB — BASIC METABOLIC PANEL
Anion gap: 8 (ref 5–15)
BUN: 57 mg/dL — ABNORMAL HIGH (ref 6–20)
CO2: 19 mmol/L — ABNORMAL LOW (ref 22–32)
Calcium: 8.1 mg/dL — ABNORMAL LOW (ref 8.9–10.3)
Chloride: 109 mmol/L (ref 98–111)
Creatinine, Ser: 6.87 mg/dL — ABNORMAL HIGH (ref 0.44–1.00)
GFR, Estimated: 7 mL/min — ABNORMAL LOW (ref >60)
Glucose, Bld: 110 mg/dL — ABNORMAL HIGH (ref 70–99)
Potassium: 4.3 mmol/L (ref 3.5–5.1)
Sodium: 136 mmol/L (ref 135–145)

## 2022-10-07 LAB — CBC
HCT: 22.6 % — ABNORMAL LOW (ref 36.0–46.0)
Hemoglobin: 7.4 g/dL — ABNORMAL LOW (ref 12.0–15.0)
MCH: 29.1 pg (ref 26.0–34.0)
MCHC: 32.7 g/dL (ref 30.0–36.0)
MCV: 89 fL (ref 80.0–100.0)
Platelets: 196 10*3/uL (ref 150–400)
RBC: 2.54 MIL/uL — ABNORMAL LOW (ref 3.87–5.11)
RDW: 13.1 % (ref 11.5–15.5)
WBC: 12.3 10*3/uL — ABNORMAL HIGH (ref 4.0–10.5)
nRBC: 0 % (ref 0.0–0.2)

## 2022-10-07 LAB — MAGNESIUM: Magnesium: 2.1 mg/dL (ref 1.7–2.4)

## 2022-10-07 LAB — GLUCOSE, CAPILLARY
Glucose-Capillary: 114 mg/dL — ABNORMAL HIGH (ref 70–99)
Glucose-Capillary: 114 mg/dL — ABNORMAL HIGH (ref 70–99)
Glucose-Capillary: 114 mg/dL — ABNORMAL HIGH (ref 70–99)
Glucose-Capillary: 170 mg/dL — ABNORMAL HIGH (ref 70–99)

## 2022-10-07 MED ORDER — SENNOSIDES-DOCUSATE SODIUM 8.6-50 MG PO TABS: 2.0000 | ORAL_TABLET | Freq: Two times a day (BID) | ORAL | Status: DC | PRN

## 2022-10-07 NOTE — Consult Note (Addendum)
Consultation Note   Referring Provider: [facility] PCP: Flossie Buffy, NP Primary Gastroenterologist: unassigned  Reason for consultation: Nausea and vomiting  Hospital Day: 3  Assessment / Plan   # 56 yo female with N/V for one week. At onset she had associated diarrhea which has since resolved. Doesn't usually have either one of these problems at home. Labs and non-contrast CT scan unrevealing. No evidence for DKA.  Gastroenteritis?   Nausea and vomiting improving. She reports only one episode of N/V yesterday and one this am but has since tolerated fruit.  Continue BID PPI for now   Continue prn anti-emetics. Since she seems to be improving will not put on scheduling dosing for now.  Hopefully illness is self-limiting. If N/V doesn't resolve will consider EGD.   # GERD, recently decreased PPI to prn and was doing okay until this acute illness started.  N/V possibly related to stopping PPI but diarrhea doesn't fit in with that.  BID PPI for now. Upon discharge should be able to resume once daily dosing.   # AKI on CKD V. Interesting patient tells me she isn't followed outpatient by Nephrology.   # Chronic tracheostomy.   See PMH for additional medical problems   HPI   Maureen Jones is a 56 y.o. 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  Patient presented to ED 10/25  for evaluation of nausea and vomiting. Symptoms started about a week ago and she also had non-bloody diarrhea which has now resolved. She endorses RLQ "annoying" pain associated with the symptoms. She had this same pain at some other point in time ( ? Maybe months ago) when she also had N/V/D.  Though diarrhea resolved she continued to have N/V several  times a day. No contact with others with similar symptoms. She hasn't recently started any new medications. Generally speaking her only GI issue is that of GERD which has been much better later. In fact, GERD medications was changd to prn only a few weeks back. She did fine taking it only as needed until the N/V started several days ago and then she started getting heartburn again. Yesterday she had only one episode of vomiting. Today, after eating broth she vomited once but has since tolerated fruit. The RLQ discomfort is really just an "annoyance" which sometimes gets better with a BM.  From the ED she was admitted with AKI on CKD.   No acute findings on non-contrast CT scan. LFTs, lipase okay. WBC elevated at 17K. Glucose 250, No evidence for DKA   Previous GI Evaluation    None No history of a colonoscopy.   Recent Labs and Imaging CT ABDOMEN PELVIS WO CONTRAST  Result Date: 10/05/2022 CLINICAL DATA:  Right lower quadrant abdominal pain. EXAM: CT ABDOMEN AND PELVIS WITHOUT CONTRAST TECHNIQUE: Multidetector CT imaging of the abdomen and pelvis was performed following the standard protocol without IV contrast. RADIATION DOSE REDUCTION: This exam was performed according to the departmental dose-optimization program which includes automated exposure control, adjustment of the mA and/or kV according to  patient size and/or use of iterative reconstruction technique. COMPARISON:  Abdominopelvic CT 02/13/2022 FINDINGS: Lower chest: Interval improved aeration of the lung bases with mild subsegmental atelectasis. Stable small pericardial effusion and small hiatal hernia. Hepatobiliary: The liver is normal in density without suspicious focal abnormality. No evidence of gallstones, gallbladder wall thickening or biliary dilatation. The gallbladder appears unchanged. Pancreas: Unremarkable. No pancreatic ductal dilatation or surrounding inflammatory changes. Spleen: Normal in size without focal abnormality.  Adrenals/Urinary Tract: Both adrenal glands appear stable. Unchanged perinephric soft tissue stranding bilaterally. No hydronephrosis or ureteral calculus. The bladder appears unremarkable for its degree of distention. Stomach/Bowel: No enteric contrast administered. As above, small hiatal hernia with mild distal esophageal wall thickening. The stomach appears unremarkable for its degree of distention. No evidence of bowel distension, wall thickening or focal surrounding inflammation. The appendix is not clearly seen and may be surgically absent. Vascular/Lymphatic: Stable small retroperitoneal lymph nodes, likely reactive. Mild aortic and branch vessel atherosclerosis without evidence of aneurysm. Reproductive: Hysterectomy.  No adnexal mass. Other: Unchanged perinephric and retroperitoneal soft tissue stranding without focal fluid collection or ascites. No free air. Mild edema in the subcutaneous fat. No evidence of abdominal wall hernia. Musculoskeletal: No acute or significant osseous findings. Mild thoracolumbar spondylosis. IMPRESSION: 1. No acute findings or explanation for the patient's symptoms. 2. Stable nonspecific perinephric and retroperitoneal soft tissue stranding without focal fluid collection or hydronephrosis. 3. Stable small hiatal hernia with mild distal esophageal wall thickening. 4.  Aortic Atherosclerosis (ICD10-I70.0). Electronically Signed   By: Richardean Sale M.D.   On: 10/05/2022 17:05    Labs:  Recent Labs    10/05/22 1057 10/06/22 0330 10/07/22 0351  WBC 13.1* 16.9* 12.3*  HGB 9.3* 8.9* 7.4*  HCT 29.7* 28.3* 22.6*  PLT 240 239 196   Recent Labs    10/05/22 1057 10/06/22 0330 10/06/22 1209 10/07/22 0351  NA 143 139  --  136  K 4.9 4.7  --  4.3  CL 108 108  --  109  CO2 20* 19*  --  19*  GLUCOSE 250* 200*  --  110*  BUN 67* 63*  --  57*  CREATININE 7.56* 6.85* 6.99* 6.87*  CALCIUM 9.1 8.3*  --  8.1*   Recent Labs    10/05/22 1057  PROT 7.1  ALBUMIN 3.4*   AST 30  ALT 17  ALKPHOS 114  BILITOT 0.7   Past Medical History:  Diagnosis Date   Acute respiratory failure with hypoxemia (HCC) 12/07/2021   Acute respiratory failure with hypoxia (HCC) 12/07/2021   Asthma    Colitis    Diabetes ([place])    Encephalopathy acute 09/20/2021   Glottic and subglottic edema 2022   Hypertension     Past Surgical History:  Procedure Laterality Date   TRACHEOSTOMY REVISION  12/16/2021   Procedure: TRACHEOSTOMY EXCHANGE;  Surgeon: Juanito Doom, MD;  Location: [facility];  Service: Cardiopulmonary;;    Family History  Family history unknown: Yes    Prior to Admission medications   Medication Sig Start Date End Date Taking? Authorizing Provider  amLODipine (NORVASC) 10 MG tablet Take 1 tablet (10 mg total) by mouth at bedtime. 04/01/22  Yes Nche, Charlene Brooke, NP  atorvastatin (LIPITOR) 20 MG tablet Take 1 tablet (20 mg total) by mouth daily. 04/01/22  Yes Nche, Charlene Brooke, NP  azelastine (ASTELIN) 0.1 % nasal spray Place 2 sprays into both nostrils 2 (two) times daily. Use in each nostril as directed   Yes  [provider]  carvedilol (COREG) 25 MG tablet Take 1 tablet (25 mg total) by mouth 2 (two) times daily. 04/01/22  Yes Nche, Charlene Brooke, NP  cloNIDine (CATAPRES) 0.1 MG tablet Take 1 tablet (0.1 mg total) by mouth 2 (two) times daily. 07/05/22  Yes Nche, Charlene Brooke, NP  DULoxetine (CYMBALTA) 60 MG capsule Take 1 capsule (60 mg total) by mouth daily. 04/01/22  Yes Nche, Charlene Brooke, NP  fluticasone (FLONASE) 50 MCG/ACT nasal spray Place 2 sprays into both nostrils daily. 04/12/22  Yes Nche, Charlene Brooke, NP  hydrALAZINE (APRESOLINE) 100 MG tablet Take 1 tablet (100 mg total) by mouth 3 (three) times daily. 04/01/22  Yes Nche, Charlene Brooke, NP  levocetirizine (XYZAL) 5 MG tablet Take 1 tablet (5 mg total) by mouth every evening. 04/12/22  Yes Nche, Charlene Brooke, NP  losartan (COZAAR) 50 MG tablet Take 1 tablet (50 mg total) by mouth  daily. 04/01/22  Yes Nche, Charlene Brooke, NP  pantoprazole (PROTONIX) 40 MG tablet Take 1 tablet (40 mg total) by mouth 2 (two) times daily. Take twice a day for 1 month, then go back to daily Patient taking differently: Take 40 mg by mouth daily as needed (GERD). 08/11/22 10/05/22 Yes Erick Colace, NP  promethazine (PHENERGAN) 25 MG tablet Take 1 tablet (25 mg total) by mouth every 6 (six) hours as needed for nausea or vomiting. 02/17/22  Yes Barb Merino, MD  spironolactone (ALDACTONE) 25 MG tablet Take 0.5 tablets (12.5 mg total) by mouth daily. Patient taking differently: Take 25 mg by mouth daily. 07/05/22  Yes Nche, Charlene Brooke, NP  traZODone (DESYREL) 50 MG tablet Take 1 tablet (50 mg total) by mouth at bedtime. 07/22/22  Yes Nche, Charlene Brooke, NP  TRESIBA FLEXTOUCH 200 UNIT/ML FlexTouch Pen Inject 32 Units into the skin 2 (two) times daily. 04/01/22  Yes Nche, Charlene Brooke, NP  Continuous Blood Gluc Sensor (FREESTYLE LIBRE 14 DAY SENSOR) MISC 1 Units by Does not apply route every 14 (fourteen) days. 07/04/22   Nche, Charlene Brooke, NP  glucose blood (ONETOUCH VERIO) test strip Use as instructed 04/01/22   Nche, Charlene Brooke, NP  Insulin Pen Needle 32G X 4 MM MISC Use to inject insulin up to 4 times daily as directed 04/01/22   Nche, Charlene Brooke, NP  insulin aspart (NOVOLOG) 100 UNIT/ML injection Insulin (Novolog) sliding scale for additional Novolog: administer 5minutes before meals. Sugar <150 - no units Sugar >151-200 - 4 units Sugar >200-250 - 6 units Sugar >251-300 - 10 units and call office. Patient taking differently: Inject 4-10 Units into the skin See admin instructions. Insulin (Novolog) sliding scale for additional Novolog: administer 77minutes before meals. Sugar <150 - no units Sugar >151-200 - 4 units Sugar >200-250 - 6 units Sugar >251-300 - 10 units and call office. 11/22/21 12/17/21  Nche, Charlene Brooke, NP    Current Facility-Administered Medications  Medication Dose  Route Frequency Provider Last Rate Last Admin   0.9 %  sodium chloride infusion   Intravenous Continuous Damita Lack, MD 75 mL/hr at 10/06/22 2348 New Bag at 10/06/22 2348   amLODipine (NORVASC) tablet 10 mg  10 mg Oral Daily Amin, Ankit Chirag, MD   10 mg at 10/07/22 0852   atorvastatin (LIPITOR) tablet 20 mg  20 mg Oral Daily Amin, Ankit Chirag, MD   20 mg at 10/07/22 0853   carvedilol (COREG) tablet 25 mg  25 mg Oral BID WC Amin, Jeanella Flattery, MD   25  mg at 10/07/22 0853   cloNIDine (CATAPRES) tablet 0.1 mg  0.1 mg Oral BID Damita Lack, MD   0.1 mg at 10/07/22 0853   famotidine (PEPCID) tablet 20 mg  20 mg Oral QHS Amin, Ankit Chirag, MD   20 mg at 10/06/22 2219   guaiFENesin (ROBITUSSIN) 100 MG/5ML liquid 5 mL  5 mL Oral Q4H PRN Amin, Ankit Chirag, MD       heparin injection 5,000 Units  5,000 Units Subcutaneous Q8H Amin, Jeanella Flattery, MD   5,000 Units at 10/07/22 0536   hydrALAZINE (APRESOLINE) injection 10 mg  10 mg Intravenous Q4H PRN Amin, Ankit Chirag, MD       hydrALAZINE (APRESOLINE) tablet 100 mg  100 mg Oral TID Amin, Ankit Chirag, MD   100 mg at 10/07/22 0853   insulin aspart (novoLOG) injection 0-5 Units  0-5 Units Subcutaneous QHS Amin, Ankit Chirag, MD   2 Units at 10/05/22 2316   insulin aspart (novoLOG) injection 0-9 Units  0-9 Units Subcutaneous TID WC Amin, Ankit Chirag, MD   1 Units at 10/06/22 1807   insulin glargine-yfgn (SEMGLEE) injection 16 Units  16 Units Subcutaneous BID Amin, Ankit Chirag, MD   16 Units at 10/07/22 0853   ipratropium-albuterol (DUONEB) 0.5-2.5 (3) MG/3ML nebulizer solution 3 mL  3 mL Nebulization Q4H PRN Amin, Ankit Chirag, MD       metoprolol tartrate (LOPRESSOR) injection 5 mg  5 mg Intravenous Q4H PRN Amin, Ankit Chirag, MD       ondansetron (ZOFRAN) injection 4 mg  4 mg Intravenous Q6H PRN Amin, Ankit Chirag, MD   4 mg at 10/07/22 0705   pantoprazole (PROTONIX) EC tablet 40 mg  40 mg Oral BID AC Amin, Ankit Chirag, MD   40 mg at  10/07/22 0853   senna-docusate (Senokot-S) tablet 2 tablet  2 tablet Oral BID PRN Amin, Ankit Chirag, MD       traZODone (DESYREL) tablet 50 mg  50 mg Oral QHS PRN Amin, Ankit Chirag, MD       traZODone (DESYREL) tablet 50 mg  50 mg Oral QHS Amin, Ankit Chirag, MD   50 mg at 10/06/22 2215    Allergies as of 10/05/2022   (No Known Allergies)    Social History   Socioeconomic History   Marital status: Single    Spouse name: Not on file   Number of children: Not on file   Years of education: Not on file   Highest education level: Not on file  Occupational History   Not on file  Tobacco Use   Smoking status: Former    Types: Cigarettes   Smokeless tobacco: Never  Vaping Use   Vaping Use: Never used  Substance and Sexual Activity   Alcohol use: Never   Drug use: Never   Sexual activity: Not Currently  Other Topics Concern   Not on file  Social History Narrative   Not on file   Social Determinants of Health   Financial Resource Strain: Not on file  Food Insecurity: Not on file  Transportation Needs: Not on file  Physical Activity: Not on file  Stress: Not on file  Social Connections: Not on file  Intimate Partner Violence: Not on file    Review of Systems: All systems reviewed and negative except where noted in HPI.  Physical Exam: Vital signs in last 24 hours: Temp:  [98.8 F (37.1 C)-99.4 F (37.4 C)] 99.4 F (37.4 C) (10/27 0823) Pulse Rate:  [78-88]  87 (10/27 0932) Resp:  [16-18] 18 (10/27 0932) BP: (143-183)/(67-81) 170/81 (10/27 0823) SpO2:  [97 %-100 %] 98 % (10/27 0932) FiO2 (%):  [28 %] 28 % (10/27 0315) Weight:  [104.6 kg] 104.6 kg (10/27 0535) Last BM Date : 10/06/22  General:  Very  pleasant obese female Psych:  cooperative. Normal mood and affect Eyes: Pupils equal Ears:  Normal auditory acuity Nose: No deformity, discharge or lesions Neck:  Supple, no masses felt, + tracheostomy Lungs:  Clear to auscultation.  Heart:  Regular rate, regular  rhythm. No lower extremity edema Abdomen:  Soft, nondistended, nontender, active bowel sounds, no masses felt Rectal :  Deferred Msk: Symmetrical without gross deformities.  Neurologic:  Alert, oriented, grossly normal neurologically Skin:  Intact without significant lesions.    Intake/Output from previous day: No intake/output data recorded. Intake/Output this shift:  No intake/output data recorded.   Principal Problem:   AKI (acute kidney injury) ([place]) Active Problems:   Vocal cord dysfunction   Tracheostomy dependence ([place])   Resistant hypertension   OSA (obstructive sleep apnea)   DM (diabetes mellitus) ([place])   CKD (chronic kidney disease) stage 5, GFR less than 15 ml/min (HCC)   Nausea vomiting and diarrhea   Morbid (severe) obesity due to excess calories ([place])    Tye Savoy, NP-C @  10/07/2022, 10:42 AM     GI Attending   I have taken an interval history, reviewed the chart and examined the patient. I agree with the Advanced Practitioner's note, impression and recommendations.    GERD and persistent nausea and vomiting that is improving. Will f/u tomorrow.  May benefit from an EGD at some point depending upon clinical course.  Gatha Mayer, MD, [facility] See Shea Evans on call - gastroenterology for best contact person 10/07/2022 5:01 PM

## 2022-10-07 NOTE — Consult Note (Addendum)
Nephrology Consult   Requesting provider: Dr. Reesa Chew Service requesting consult: Hospitalist Reason for consult: AKI   Assessment/Recommendations: Maureen Jones is a/an 56 y.o. 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 She was admitted for AKI.   Non-Oliguric AKI on CKD V:  Likely prerenal secondary to volume depletion in setting of vomiting and diarrhea with poor oral intake. SCr 7.56 >> 6.87. Recent baseline SCr approx 4.0. No evidence of obstruction on CT abdomen/pelvis 10/25.  Renal function appears to be plateauing with volume repletion.   Suspect she has CKD secondary to poorly controlled HTN and DM - she had a nephrology consult in 2021 at [facility] - noted to have nephrotic range proteinuria, neg serologic w/u and never followed up.   -Continue volume repletion  -Hold losartan and aldactone  -Currently no indication for HD -Continue to monitor daily labs -Dose meds for GFR<15 -Monitor Daily I/Os, Daily weight  -May obtain urine sample for sediment analysis if AKI fails to improve with efforts stated above -Maintain MAP>65 for optimal renal perfusion.  -Avoid further nephrotoxins including NSAIDS, Morphine.  Unless absolutely necessary, avoid CT with contrast and/or MRI with gadolinium.    -Continue to educate the patient re: importance of f/u especially in light of the progressive nature of her CKD even prior to the AKI episode.    Anemia of CKD: Hgb 7.4 today.  -Check iron profile and ferritin -Transfuse Hgb <7.0 -Will consider ESA +/- while inpatient   Nausea and vomiting with heartburn symptoms  LFTs, lipase normal. CT abdomen pelvis without contrast unremarkable, no gallstones seen. Mild hyperglycemia but no evidence of DKA, does have glycosuria. Continue antiemetics and supportive  care. PPI, Pepcid started. Abdominal xray ordered today, gaseous distention of transverse colon without obstruction seen.   Insulin-dependent diabetes mellitus type 2 Per primary team- Semglee 16 units twice daily. 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 Has recently undergone work-up for concerns of vocal cord dysfunction/subglottic stenosis. ENT and pulmonary recommend periodic trach changes, last trach change 2 days ago OP.   Hypertension, uncontrolled SBP has been in 157-183 range. Patient has had difficulty controlling BP since hospitalization last fall.  -Norvasc, Coreg, clonidine, hydralazine ordered  DM: longstanding poorly controlled.  Gluc control per primary.  ACEi/ARB and SGLT2i use limited by current GFR - resume as able.    Cathleen Fears, [facility] PA-S2  I personally saw and evaluated the patient and agree with the assessment and plan by Cathleen Fears PA student.  I made edits to the note above and performed by own exam.  I communicated the assessment and plan to the patient.    Jannifer Hick MD [facility] Pager [phone]    _____________________________________________________________________________________   History of Present Illness: Maureen Jones is a/an 56 y.o. 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 Patient had on and off nausea, vomiting and diarrhea for 5-7 days as well as poor oral intake. Had also been having right lower quadrant pain, bloating and heartburn. Patient was admitted for management of acute kidney injury. Nephrology was consulted to assist in management.   Patient seen in room today resting in bed.  Is having continued right lower quadrant pain and feels bloated. Does not want to eat  because she feels full. Is nauseous and vomited once yesterday and once again today. Has otherwise been able to tolerate oral fluids. Diarrhea has resolved. Is still having burning pain in her chest that she says feels like reflux. Is not having relief with medications. Has right sided HA that started this morning. Thinks it feels like a migraine which she reports a recent history of starting a few months ago. Has had issues controlling her blood pressure since last fall when she was hospitalized for encephalopathy. Reports she takes her medications as prescribed but BP still up to 200's sometimes. Denies dizziness, CP, SOB, diarrhea, altered taste, LE edema.   Reports history of kidney disease but does not regularly see a nephrologist due to transportation issues. Thinks she was told about kidney disease last fall.     Medications:  Current Facility-Administered Medications  Medication Dose Route Frequency Provider Last Rate Last Admin   0.9 %  sodium chloride infusion   Intravenous Continuous Damita Lack, MD 75 mL/hr at 10/06/22 2348 New Bag at 10/06/22 2348   amLODipine (NORVASC) tablet 10 mg  10 mg Oral Daily Amin, Ankit Chirag, MD   10 mg at 10/07/22 0852   atorvastatin (LIPITOR) tablet 20 mg  20 mg Oral Daily Amin, Ankit Chirag, MD   20 mg at 10/07/22 0853   carvedilol (COREG) tablet 25 mg  25 mg Oral BID WC Amin, Ankit Chirag, MD   25 mg at 10/07/22 0853   cloNIDine (CATAPRES) tablet 0.1 mg  0.1 mg Oral BID Amin, Ankit Chirag, MD   0.1 mg at 10/07/22 0853   famotidine (PEPCID) tablet 20 mg  20 mg Oral QHS Amin, Ankit Chirag, MD   20 mg at 10/06/22 2219   guaiFENesin (ROBITUSSIN) 100 MG/5ML liquid 5 mL  5 mL Oral Q4H PRN Amin, Ankit Chirag, MD       heparin injection 5,000 Units  5,000 Units Subcutaneous Q8H Amin, Ankit Chirag, MD   5,000 Units at 10/07/22 0536   hydrALAZINE (APRESOLINE) injection 10 mg  10 mg Intravenous Q4H PRN Amin, Ankit Chirag, MD       hydrALAZINE (APRESOLINE)  tablet 100 mg  100 mg Oral TID Amin, Ankit Chirag, MD   100 mg at 10/07/22 0853   insulin aspart (novoLOG) injection 0-5 Units  0-5 Units Subcutaneous QHS Amin, Ankit Chirag, MD   2 Units at 10/05/22 2316   insulin aspart (novoLOG) injection 0-9 Units  0-9 Units Subcutaneous TID WC Amin, Ankit Chirag, MD   1 Units at 10/06/22 1807   insulin glargine-yfgn (SEMGLEE) injection 16 Units  16 Units Subcutaneous BID Amin, Ankit Chirag, MD   16 Units at 10/07/22 0853   ipratropium-albuterol (DUONEB) 0.5-2.5 (3) MG/3ML nebulizer solution 3 mL  3 mL Nebulization Q4H PRN Amin, Ankit Chirag, MD       metoprolol tartrate (LOPRESSOR) injection 5 mg  5 mg Intravenous Q4H PRN Amin, Ankit Chirag, MD       ondansetron (ZOFRAN) injection 4 mg  4 mg Intravenous Q6H PRN Amin, Ankit Chirag, MD   4 mg at 10/07/22 0705   pantoprazole (PROTONIX) EC tablet 40 mg  40 mg Oral BID AC Amin, Ankit Chirag, MD   40 mg at 10/07/22 0853   senna-docusate (Senokot-S) tablet 2 tablet  2 tablet Oral BID PRN Damita Lack, MD       traZODone (DESYREL) tablet 50  mg  50 mg Oral QHS PRN Amin, Ankit Chirag, MD       traZODone (DESYREL) tablet 50 mg  50 mg Oral QHS Amin, Ankit Chirag, MD   50 mg at 10/06/22 2215     ALLERGIES Patient has no known allergies.  MEDICAL HISTORY Past Medical History:  Diagnosis Date   Acute respiratory failure with hypoxemia ([place]) 12/07/2021   Acute respiratory failure with hypoxia (H[facility]) 12/07/2021   Asthma    Colitis    Diabetes ([place])    Encephalopathy acute 09/20/2021   Glottic and subglottic edema 2022   Hypertension      SOCIAL HISTORY Social History   Socioeconomic History   Marital status: Single    Spouse name: Not on file   Number of children: Not on file   Years of education: Not on file   Highest education level: Not on file  Occupational History   Not on file  Tobacco Use   Smoking status: Former    Types: Cigarettes   Smokeless tobacco: Never  Vaping Use   Vaping Use:  Never used  Substance and Sexual Activity   Alcohol use: Never   Drug use: Never   Sexual activity: Not Currently  Other Topics Concern   Not on file  Social History Narrative   Not on file   Social Determinants of Health   Financial Resource Strain: Not on file  Food Insecurity: Not on file  Transportation Needs: Not on file  Physical Activity: Not on file  Stress: Not on file  Social Connections: Not on file  Intimate Partner Violence: Not on file     FAMILY HISTORY Family History  Family history unknown: Yes      Review of Systems: 12 systems reviewed Otherwise as per HPI, all other systems reviewed and negative  Physical Exam: Vitals:   10/07/22 0823 10/07/22 0932  BP: (!) 170/81   Pulse: 87 87  Resp: 18 18  Temp: 99.4 F (37.4 C)   SpO2: 99% 98%    General: Obese female, sitting in bed, in no acute distress CV: Regular rate, normal rhythm, no murmurs, no gallops, no rubs,  Lungs: Clear to auscultation bilaterally, normal work of breathing, trach in place on trach collar, connected to 3 L O2 Abd: Soft, distended, tenderness to palpation in RLQ, no rebound or guarding Skin: No visible lesions or rashes Extremities: Warm, mild edema in dependent portions of LE Psych: alert, engaged, appropriate mood and affect Neuro: normal speech, no gross focal deficits   Test Results Reviewed Lab Results  Component Value Date   NA 136 10/07/2022   K 4.3 10/07/2022   CL 109 10/07/2022   CO2 19 (L) 10/07/2022   BUN 57 (H) 10/07/2022   CREATININE 6.87 (H) 10/07/2022   GFR 9.86 (LL) 07/04/2022   CALCIUM 8.1 (L) 10/07/2022   ALBUMIN 3.4 (L) 10/05/2022   PHOS 2.9 07/04/2022     I have reviewed all relevant outside healthcare records related to the patient's kidney injury.

## 2022-10-07 NOTE — Progress Notes (Signed)
PROGRESS NOTE    Maureen Jones  ZOX:[id number] DOB: [birth year] DOA: 10/05/2022 PCP: Flossie Buffy, NP   Brief Narrative:  56 y.o. 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 She was admitted for acute kidney injury, slowly improving with IV fluids.  Creatinine started plateauing therefore nephrology consulted.  Also has persistent heartburn despite of Pepcid and PPI, GI consulted   Assessment & Plan:  Principal Problem:   AKI (acute kidney injury) ([place]) Active Problems:   Vocal cord dysfunction   Tracheostomy dependence ([place])   Resistant hypertension   OSA (obstructive sleep apnea)   DM (diabetes mellitus) ([facility])   CKD (chronic kidney disease) stage 5, GFR less than 15 ml/min (H[facility])   Nausea vomiting and diarrhea   Morbid (severe) obesity due to excess calories (H[facility])    Acute kidney injury on CKD stage V -Baseline creatinine 4.0, admission creatinine 7.56.  Suspect from nausea vomiting, prerenal.  Creatinine still remains elevated at 6.8 despite of IV fluids.  Holding losartan and Aldactone.  Nephro salted further input   Nausea and vomiting with heartburn type symptoms - Unclear etiology.  LFTs, lipase are normal.  CT abdomen pelvis without contrast is unremarkable.  Symptoms persist despite of PPI and Pepcid.  GI consulted for their input. Abdominal reordered this morning with bloating.  Out of bed to chair   Insulin-dependent diabetes mellitus type 2 - While in acute kidney injury, will order lower dose of insulin.   Semglee to 16 units twice daily.  Sliding scale and Accu-Cheks   Tracheostomy dependence - Patient follows outpatient pulmonary.  Recently has undergone work-up for concerns of vocal cord dysfunction/subglottic stenosis.  ENT and pulmonary recommending  periodic trach change, last trach change 2 days ago by outpatient pulmonary.  We will closely monitor here, if necessary we can get pulmonary involved   Essential hypertension, uncontrolled - Norvasc, Coreg, clonidine, hydralazine ordered.  IV as needed ordered   GERD - PPI    DVT prophylaxis: SQ. heparin Code Status: Full code Family Communication:    Maintain hospital stay until renal function improves Nutritional status          Body mass index is 37.22 kg/m.         Subjective: Still with noting of quite a bit of heartburn.  Also tells me he feels bloated   Examination:  General exam: Appears calm and comfortable, tracheostomy in place Respiratory system: Clear to auscultation. Respiratory effort normal. Cardiovascular system: S1 & S2 heard, RRR. No JVD, murmurs, rubs, gallops or clicks. No pedal edema. Gastrointestinal system: Abdomen is nondistended, soft and nontender. No organomegaly or masses felt. Normal bowel sounds heard. Central nervous system: Alert and oriented. No focal neurological deficits. Extremities: Symmetric 5 x 5 power. Skin: No rashes, lesions or ulcers Psychiatry: Judgement and insight appear normal. Mood & affect appropriate.     Objective: Vitals:   10/07/22 0315 10/07/22 0535 10/07/22 0823 10/07/22 0932  BP:  (!) 183/67 (!) 170/81   Pulse: 78 85 87 87  Resp: 17 16 18 18   Temp:  99.3 F (37.4 C) 99.4 F (37.4 C)   TempSrc:  Oral Oral   SpO2:  100% 99% 98%  Weight:  104.6 kg     No intake or output data in the 24 hours ending 10/07/22 [place]  10/07/22 0535  Weight: 104.6 kg     Data Reviewed:   CBC: Recent Labs  Lab 10/05/22 1057 10/06/22 0330 10/07/22 0351  WBC 13.1* 16.9* 12.3*  NEUTROABS 11.4*  --   --   HGB 9.3* 8.9* 7.4*  HCT 29.7* 28.3* 22.6*  MCV 91.7 91.6 89.0  PLT 240 239 606   Basic Metabolic Panel: Recent Labs  Lab 10/05/22 1057 10/06/22 0330 10/06/22 1209 10/07/22 0351  NA 143  139  --  136  K 4.9 4.7  --  4.3  CL 108 108  --  109  CO2 20* 19*  --  19*  GLUCOSE 250* 200*  --  110*  BUN 67* 63*  --  57*  CREATININE 7.56* 6.85* 6.99* 6.87*  CALCIUM 9.1 8.3*  --  8.1*  MG  --  1.3*  --  2.1   GFR: Estimated Creatinine Clearance: 11.3 mL/min (A) (by C-G formula based on SCr of 6.87 mg/dL (H)). Liver Function Tests: Recent Labs  Lab 10/05/22 1057  AST 30  ALT 17  ALKPHOS 114  BILITOT 0.7  PROT 7.1  ALBUMIN 3.4*   Recent Labs  Lab 10/05/22 1057  LIPASE 40   No results for input(s): "AMMONIA" in the last 168 hours. Coagulation Profile: No results for input(s): "INR", "PROTIME" in the last 168 hours. Cardiac Enzymes: No results for input(s): "CKTOTAL", "CKMB", "CKMBINDEX", "TROPONINI" in the last 168 hours. BNP (last 3 results) Recent Labs    04/01/22 1147  PROBNP 306*   HbA1C: No results for input(s): "HGBA1C" in the last 72 hours. CBG: Recent Labs  Lab 10/06/22 1021 10/06/22 1241 10/06/22 1803 10/06/22 2117 10/07/22 0820  GLUCAP 173* 174* 141* 126* 114*   Lipid Profile: No results for input(s): "CHOL", "HDL", "LDLCALC", "TRIG", "CHOLHDL", "LDLDIRECT" in the last 72 hours. Thyroid Function Tests: No results for input(s): "TSH", "T4TOTAL", "FREET4", "T3FREE", "THYROIDAB" in the last 72 hours. Anemia Panel: No results for input(s): "VITAMINB12", "FOLATE", "FERRITIN", "TIBC", "IRON", "RETICCTPCT" in the last 72 hours. Sepsis Labs: No results for input(s): "PROCALCITON", "LATICACIDVEN" in the last 168 hours.  Recent Results (from the past 240 hour(s))  Resp Panel by RT-PCR (Flu A&B, Covid) Anterior Nasal Swab     Status: None   Collection Time: 10/05/22  5:11 PM   Specimen: Anterior Nasal Swab  Result Value Ref Range Status   SARS Coronavirus 2 by RT PCR NEGATIVE NEGATIVE Final    Comment: (NOTE) SARS-CoV-2 target nucleic acids are NOT DETECTED.  The SARS-CoV-2 RNA is generally detectable in upper respiratory specimens during the  acute phase of infection. The lowest concentration of SARS-CoV-2 viral copies this assay can detect is 138 copies/mL. A negative result does not preclude SARS-Cov-2 infection and should not be used as the sole basis for treatment or other patient management decisions. A negative result may occur with  improper specimen collection/handling, submission of specimen other than nasopharyngeal swab, presence of viral mutation(s) within the areas targeted by this assay, and inadequate number of viral copies(<138 copies/mL). A negative result must be combined with clinical observations, patient history, and epidemiological information. The expected result is Negative.  Fact Sheet for Patients:  EntrepreneurPulse.com.au  Fact Sheet for Healthcare Providers:  IncredibleEmployment.be  This test is no t yet approved or cleared by the [place] FDA and  has been authorized for detection and/or diagnosis of SARS-CoV-2 by FDA under an Emergency Use Authorization (EUA). This EUA will remain  in effect (meaning this test can be  used) for the duration of the COVID-19 declaration under Section 564(b)(1) of the Act, 21 U.S.C.section 360bbb-3(b)(1), unless the authorization is terminated  or revoked sooner.       Influenza A by PCR NEGATIVE NEGATIVE Final   Influenza B by PCR NEGATIVE NEGATIVE Final    Comment: (NOTE) The Xpert Xpress SARS-CoV-2/FLU/RSV plus assay is intended as an aid in the diagnosis of influenza from Nasopharyngeal swab specimens and should not be used as a sole basis for treatment. Nasal washings and aspirates are unacceptable for Xpert Xpress SARS-CoV-2/FLU/RSV testing.  Fact Sheet for Patients: EntrepreneurPulse.com.au  Fact Sheet for Healthcare Providers: IncredibleEmployment.be  This test is not yet approved or cleared by the [place] FDA and has been authorized for detection and/or  diagnosis of SARS-CoV-2 by FDA under an Emergency Use Authorization (EUA). This EUA will remain in effect (meaning this test can be used) for the duration of the COVID-19 declaration under Section 564(b)(1) of the Act, 21 U.S.C. section 360bbb-3(b)(1), unless the authorization is terminated or revoked.  Performed at [facility], [place] [address], [place], [place] [address]          Radiology Studies: CT ABDOMEN PELVIS WO CONTRAST  Result Date: 10/05/2022 CLINICAL DATA:  Right lower quadrant abdominal pain. EXAM: CT ABDOMEN AND PELVIS WITHOUT CONTRAST TECHNIQUE: Multidetector CT imaging of the abdomen and pelvis was performed following the standard protocol without IV contrast. RADIATION DOSE REDUCTION: This exam was performed according to the departmental dose-optimization program which includes automated exposure control, adjustment of the mA and/or kV according to patient size and/or use of iterative reconstruction technique. COMPARISON:  Abdominopelvic CT 02/13/2022 FINDINGS: Lower chest: Interval improved aeration of the lung bases with mild subsegmental atelectasis. Stable small pericardial effusion and small hiatal hernia. Hepatobiliary: The liver is normal in density without suspicious focal abnormality. No evidence of gallstones, gallbladder wall thickening or biliary dilatation. The gallbladder appears unchanged. Pancreas: Unremarkable. No pancreatic ductal dilatation or surrounding inflammatory changes. Spleen: Normal in size without focal abnormality. Adrenals/Urinary Tract: Both adrenal glands appear stable. Unchanged perinephric soft tissue stranding bilaterally. No hydronephrosis or ureteral calculus. The bladder appears unremarkable for its degree of distention. Stomach/Bowel: No enteric contrast administered. As above, small hiatal hernia with mild distal esophageal wall thickening. The stomach appears unremarkable for its degree of distention. No evidence of bowel  distension, wall thickening or focal surrounding inflammation. The appendix is not clearly seen and may be surgically absent. Vascular/Lymphatic: Stable small retroperitoneal lymph nodes, likely reactive. Mild aortic and branch vessel atherosclerosis without evidence of aneurysm. Reproductive: Hysterectomy.  No adnexal mass. Other: Unchanged perinephric and retroperitoneal soft tissue stranding without focal fluid collection or ascites. No free air. Mild edema in the subcutaneous fat. No evidence of abdominal wall hernia. Musculoskeletal: No acute or significant osseous findings. Mild thoracolumbar spondylosis. IMPRESSION: 1. No acute findings or explanation for the patient's symptoms. 2. Stable nonspecific perinephric and retroperitoneal soft tissue stranding without focal fluid collection or hydronephrosis. 3. Stable small hiatal hernia with mild distal esophageal wall thickening. 4.  Aortic Atherosclerosis (ICD10-I70.0). Electronically Signed   By: Richardean Sale M.D.   On: 10/05/2022 17:05        Scheduled Meds:  amLODipine  10 mg Oral Daily   atorvastatin  20 mg Oral Daily   carvedilol  25 mg Oral BID WC   cloNIDine  0.1 mg Oral BID   famotidine  20 mg Oral QHS   heparin  5,000 Units Subcutaneous Q8H  hydrALAZINE  100 mg Oral TID   insulin aspart  0-5 Units Subcutaneous QHS   insulin aspart  0-9 Units Subcutaneous TID WC   insulin glargine-yfgn  16 Units Subcutaneous BID   pantoprazole  40 mg Oral BID AC   traZODone  50 mg Oral QHS   Continuous Infusions:  sodium chloride 75 mL/hr at 10/06/22 2348     LOS: 2 days   Time spent= 35 mins    Brenson Hartman Arsenio Loader, MD [facility]  If 7PM-7AM, please contact night-coverage  10/07/2022, 12:06 PM

## 2022-10-07 NOTE — TOC Initial Note (Addendum)
Transition of Care [facility]) - Initial/Assessment Note    Patient Details  Name: Maureen Jones MRN: [id number] Date of Birth: 1966-09-08  Transition of Care [facility]) CM/SW Contact:    Maureen Favre, RN Phone Number: 10/07/2022, 11:09 AM  Clinical Narrative:                  Spoke to patient at bedside.   Patient from home with sister.   Patient has home oxygen for night time only. Patient gets oxygen and trach supplies through [place]   Patient states her "refill station is not working" and her emergency tank is empty.   NCM will call [employer] and ask them to check home DME. NCM asked who would be in the home for [employer] to call. Patient stated her sister Maureen Jones, however number for Maureen Jones in [facility] is not correct. Patient does not have Maureen Jones's new number . Patient's father is listed but patient stated he does not have new number. Patient believes [facility] might have Maureen Jones's number. [facility] they do not .   Called Andree Coss with [place] discussed above. They will check their records also and see if they have an updated number .   Patient aware .   [employer] may have to visit day of discharge , if sister unreachable  Expected Discharge Plan: Home/Self Care Barriers to Discharge: Continued Medical Work up   Patient Goals and CMS Choice Patient states their goals for this hospitalization and ongoing recovery are:: to return to home   Choice offered to / list presented to : NA  Expected Discharge Plan and Services Expected Discharge Plan: Home/Self Care   Discharge Planning Services: CM Consult   Living arrangements for the past 2 months: Single Family Home                 DME Arranged:  (see note)         HH Arranged: NA          Prior Living Arrangements/Services Living arrangements for the past 2 months: Single Family Home Lives with:: Siblings Patient language and need for interpreter reviewed:: Yes Do you feel safe going back to the place  where you live?: Yes      Need for Family Participation in Patient Care: Yes (Comment) Care giver support system in place?: Yes (comment) Current home services: DME Criminal Activity/Legal Involvement Pertinent to Current Situation/Hospitalization: No - Comment as needed  Activities of Daily Living      Permission Sought/Granted   Permission granted to share information with : No              Emotional Assessment Appearance:: Appears stated age Attitude/Demeanor/Rapport: Engaged Affect (typically observed): Accepting Orientation: : Oriented to Self, Oriented to Place, Oriented to  Time, Oriented to Situation Alcohol / Substance Use: Not Applicable Psych Involvement: No (comment)  Admission diagnosis:  Gastroenteritis [K52.9] AKI (acute kidney injury) ([place]) [N17.9] Hypertension, unspecified type [I10] Patient Active Problem List   Diagnosis Date Noted   AKI (acute kidney injury) ([place]) 10/05/2022   LPRD (laryngopharyngeal reflux disease) 09/27/2022   Morbid (severe) obesity due to excess calories ([place]) 07/22/2022   Difficulty with speech 07/05/2022   Tracheostomy status (HCC)    Subglottic stenosis    Nausea vomiting and diarrhea 02/13/2022   CKD (chronic kidney disease) stage 5, GFR less than 15 ml/min ([place]) 11/22/2021   Mood disorder ([place]) 11/22/2021   Tracheostomy dependence ([place])    Subglottic edema  Vocal cord dysfunction    Endotracheal tube present    Bilateral carpal tunnel syndrome 11/15/2019   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   Insomnia 11/15/2019   Mild intermittent asthma without complication 28/97/9150   OSA (obstructive sleep apnea) 11/15/2019   Seasonal allergic rhinitis 11/15/2019   Tobacco abuse 11/15/2019   DM (diabetes mellitus) ([place]) 11/15/2019   PCP:  Flossie Buffy, NP Pharmacy:   [id number] - [place], [address] [place] [address] Phone: [phone] Fax: [phone]     Social  Determinants of Health (SDOH) Interventions    Readmission Risk Interventions    12/13/2021   12:35 PM 11/15/2021   10:13 AM  Readmission Risk Prevention Plan  Transportation Screening Complete Complete  PCP or Specialist Appt within 3-5 Days Complete Complete  HRI or Home Care Consult Complete Complete  Social Work Consult for [place] Planning/Counseling Complete Patient refused  Palliative Care Screening Not Applicable Not Applicable  Medication Review Press photographer) Complete Complete

## 2022-10-08 DIAGNOSIS — N179 Acute kidney failure, unspecified: Secondary | ICD-10-CM | POA: Diagnosis not present

## 2022-10-08 LAB — CBC
HCT: 23.4 % — ABNORMAL LOW (ref 36.0–46.0)
Hemoglobin: 7.4 g/dL — ABNORMAL LOW (ref 12.0–15.0)
MCH: 28.4 pg (ref 26.0–34.0)
MCHC: 31.6 g/dL (ref 30.0–36.0)
MCV: 89.7 fL (ref 80.0–100.0)
Platelets: 146 10*3/uL — ABNORMAL LOW (ref 150–400)
RBC: 2.61 MIL/uL — ABNORMAL LOW (ref 3.87–5.11)
RDW: 12.9 % (ref 11.5–15.5)
WBC: 11.2 10*3/uL — ABNORMAL HIGH (ref 4.0–10.5)
nRBC: 0 % (ref 0.0–0.2)

## 2022-10-08 LAB — IRON AND TIBC
Iron: 51 ug/dL (ref 28–170)
Saturation Ratios: 23 % (ref 10.4–31.8)
TIBC: 224 ug/dL — ABNORMAL LOW (ref 250–450)
UIBC: 173 ug/dL

## 2022-10-08 LAB — BASIC METABOLIC PANEL
Anion gap: 10 (ref 5–15)
BUN: 57 mg/dL — ABNORMAL HIGH (ref 6–20)
CO2: 17 mmol/L — ABNORMAL LOW (ref 22–32)
Calcium: 8 mg/dL — ABNORMAL LOW (ref 8.9–10.3)
Chloride: 108 mmol/L (ref 98–111)
Creatinine, Ser: 6.81 mg/dL — ABNORMAL HIGH (ref 0.44–1.00)
GFR, Estimated: 7 mL/min — ABNORMAL LOW (ref >60)
Glucose, Bld: 97 mg/dL (ref 70–99)
Potassium: 4.6 mmol/L (ref 3.5–5.1)
Sodium: 135 mmol/L (ref 135–145)

## 2022-10-08 LAB — MAGNESIUM: Magnesium: 1.8 mg/dL (ref 1.7–2.4)

## 2022-10-08 LAB — GLUCOSE, CAPILLARY
Glucose-Capillary: 81 mg/dL (ref 70–99)
Glucose-Capillary: 127 mg/dL — ABNORMAL HIGH (ref 70–99)
Glucose-Capillary: 140 mg/dL — ABNORMAL HIGH (ref 70–99)
Glucose-Capillary: 158 mg/dL — ABNORMAL HIGH (ref 70–99)

## 2022-10-08 LAB — FERRITIN: Ferritin: 145 ng/mL (ref 11–307)

## 2022-10-08 MED ORDER — CLONIDINE HCL 0.1 MG PO TABS
0.1000 mg | ORAL_TABLET | Freq: Three times a day (TID) | ORAL | Status: DC
  Administered 2022-10-08 (×2): 0.1 mg via ORAL
  Filled 2022-10-08 (×2): qty 1

## 2022-10-08 MED ORDER — ACETAMINOPHEN 325 MG PO TABS
650.0000 mg | ORAL_TABLET | Freq: Four times a day (QID) | ORAL | Status: AC | PRN
  Administered 2022-10-08 – 2022-10-11 (×2): 650 mg via ORAL
  Filled 2022-10-08 (×2): qty 2

## 2022-10-08 NOTE — Progress Notes (Signed)
   Patient Name: Maureen Jones Date of Encounter: 10/08/2022, 2:17 PM    Subjective  Nausea and vomiting have stopped No heartburn   Objective  BP (!) 170/75 (BP Location: Left Arm)   Pulse 78   Temp 98.5 F (36.9 C) (Oral)   Resp 18   Wt 106.5 kg   SpO2 97%   BMI 37.90 kg/m  Chronically ill bw NAD     Latest Ref Rng & Units 10/08/2022    2:00 AM 10/07/2022    3:51 AM 10/06/2022    3:30 AM  CBC  WBC 4.0 - 10.5 K/uL 11.2  12.3  16.9   Hemoglobin 12.0 - 15.0 g/dL 7.4  7.4  8.9   Hematocrit 36.0 - 46.0 % 23.4  22.6  28.3   Platelets 150 - 400 K/uL 146  196  239        Assessment and Plan  Nausea and vomiting - resolved GERD Chronic anemia - anemia of chronic disease  She is better re: GI Sxs  I will set up an outpatient f/u for her ( we will notify if appointment not made before dc) and consider EGD at that time - she had GERD meds changed to prn prior to this admit - so suspect GERD + other acute illness contributing. Since she is better would stay on PPI bid at dc and scope after treated to be sure any possible inflammation would be healed and this can save from 2 EGD's. Would also consider screening colonoscopy.  Signing off  Gatha Mayer, MD, [facility] See Shea Evans on call - gastroenterology for best contact person 10/08/2022 2:17 PM

## 2022-10-08 NOTE — Progress Notes (Addendum)
[facility] Progress Note   Subjective:   I/Os 2.6 no outs recorded - says urinating well in bathroom, normal volume and character.  Normal PO intake now.  No dysgeusia, hiccups, confusion, edema, orthopnea. Wt 104.6 > 106.5kg.   Objective Vitals:   10/08/22 0235 10/08/22 0500 10/08/22 0534 10/08/22 0821  BP:   (!) 148/88 (!) 170/75  Pulse:   74 78  Resp:   17 18  Temp:   98.8 F (37.1 C) 98.5 F (36.9 C)  TempSrc:   Oral Oral  SpO2: 100%  94% 96%  Weight:  106.5 kg     Physical Exam General: obese woman who is comfortable Heart:RRR no rub Lungs: clear, trach collar Abdomen: soft, obese Extremities: no edema Neuro:  nonfocal   Additional Objective Labs: Basic Metabolic Panel: Recent Labs  Lab 10/06/22 0330 10/06/22 1209 10/07/22 0351 10/08/22 0200  NA 139  --  136 135  K 4.7  --  4.3 4.6  CL 108  --  109 108  CO2 19*  --  19* 17*  GLUCOSE 200*  --  110* 97  BUN 63*  --  57* 57*  CREATININE 6.85* 6.99* 6.87* 6.81*  CALCIUM 8.3*  --  8.1* 8.0*   Liver Function Tests: Recent Labs  Lab 10/05/22 1057  AST 30  ALT 17  ALKPHOS 114  BILITOT 0.7  PROT 7.1  ALBUMIN 3.4*   Recent Labs  Lab 10/05/22 1057  LIPASE 40   CBC: Recent Labs  Lab 10/05/22 1057 10/06/22 0330 10/07/22 0351 10/08/22 0200  WBC 13.1* 16.9* 12.3* 11.2*  NEUTROABS 11.4*  --   --   --   HGB 9.3* 8.9* 7.4* 7.4*  HCT 29.7* 28.3* 22.6* 23.4*  MCV 91.7 91.6 89.0 89.7  PLT 240 239 196 146*   Blood Culture    Component Value Date/Time   SDES URINE, CLEAN CATCH 02/13/2022 2256   SPECREQUEST NONE 02/13/2022 2256   CULT (A) 02/13/2022 2256    80,000 COLONIES/mL AEROCOCCUS SPECIES Standardized susceptibility testing for this organism is not available. Performed at [facility], [place] [address], [place], [place] [address]    REPTSTATUS 02/15/2022 FINAL 02/13/2022 2256    Cardiac Enzymes: No results for input(s): "CKTOTAL", "CKMB", "CKMBINDEX", "TROPONINI" in the  last 168 hours. CBG: Recent Labs  Lab 10/07/22 0820 10/07/22 1225 10/07/22 1642 10/07/22 2052 10/08/22 0819  GLUCAP 114* 114* 114* 170* 81   Iron Studies: No results for input(s): "IRON", "TIBC", "TRANSFERRIN", "FERRITIN" in the last 72 hours. @lablastinr3 @ Studies/Results: DG Abd 1 View  Result Date: 10/07/2022 CLINICAL DATA:  Abdominal distension. EXAM: ABDOMEN - 1 VIEW COMPARISON:  CT of the abdomen and pelvis 10/05/2022 FINDINGS: Gaseous distension of the transverse colon is noted. Bowel gas pattern is otherwise unremarkable. No stone or mass lesion is present. IMPRESSION: Gaseous distension of the transverse colon without obstruction. Electronically Signed   By: San Morelle M.D.   On: 10/07/2022 12:45   Medications:  sodium chloride 75 mL/hr at 10/08/22 0006    amLODipine  10 mg Oral Daily   atorvastatin  20 mg Oral Daily   carvedilol  25 mg Oral BID WC   cloNIDine  0.1 mg Oral BID   famotidine  20 mg Oral QHS   heparin  5,000 Units Subcutaneous Q8H   hydrALAZINE  100 mg Oral TID   insulin aspart  0-5 Units Subcutaneous QHS   insulin aspart  0-9 Units Subcutaneous TID WC  insulin glargine-yfgn  16 Units Subcutaneous BID   pantoprazole  40 mg Oral BID AC   traZODone  50 mg Oral QHS    Assessment/Recommendations: Nataya Bastedo is a/an 56 y.o. 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 She was admitted for AKI.    Non-Oliguric AKI on CKD V:  suspect prerenal AKI progressed to ATN in the setting of V/D/poor po intake on background of advanced CKD.   No evidence of obstruction on CT abdomen/pelvis 10/25.  Suspect she has CKD secondary to poorly controlled HTN and DM - she had a nephrology consult in 2021 at [place] - noted to have nephrotic range proteinuria, neg serologic w/u  and never followed up.  I do not think a renal biopsy would change current management.  -d/c IVF and allow po intake -Cont holding losartan and aldactone  -Currently no indication for HD  -Continue to monitor daily labs -Dose meds for GFR<15 -Monitor Daily I/Os, Daily weight  -- d/w RN today must have UOP recorded -Maintain MAP>65 for optimal renal perfusion.  -Avoid further nephrotoxins including NSAIDS, Morphine.  Unless absolutely necessary, avoid CT with contrast and/or MRI with gadolinium.      Normocytic anemia: Hb in 7s, no bleeding -Check iron profile and ferritin -Transfuse Hgb <7.0 -ESA +/- iron pending iron profile   Nausea and vomiting with heartburn symptoms  LFTs, lipase normal. CT abdomen pelvis without contrast unremarkable, no gallstones seen. Continue antiemetics and supportive care. PPI, Pepcid started.    Insulin-dependent diabetes mellitus type 2 Per primary team- Semglee 16 units twice daily. 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 Has recently undergone work-up for concerns of vocal cord dysfunction/subglottic stenosis. ENT and pulmonary recommend periodic trach changes, last trach change 2 days ago OP.    Hypertension, uncontrolled SBP has been in 157-183 range. Patient has had difficulty controlling BP since hospitalization last fall.  -Norvasc, Coreg, clonidine, hydralazine ordered > titrate clonidine to TID   DM: longstanding poorly controlled.  Gluc control per primary.  ACEi/ARB and SGLT2i use limited by current GFR - resume as able.   Dispo: must remain inpt for now given severity of AKI and need for daily lab monitoring    Jannifer Hick MD [place] Pager [phone]

## 2022-10-08 NOTE — Progress Notes (Signed)
PROGRESS NOTE    Maureen Jones  POE:[id number] DOB: Jul 07, 1966 DOA: 10/05/2022 PCP: Flossie Buffy, NP   Brief Narrative:  56 y.o. 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 She was admitted for acute kidney injury, slowly improving with IV fluids.  Creatinine started plateauing therefore nephrology consulted.  Also has persistent heartburn despite of Pepcid and PPI, GI consulted   Assessment & Plan:  Principal Problem:   AKI (acute kidney injury) ([place]) Active Problems:   Vocal cord dysfunction   Tracheostomy dependence ([place])   Resistant hypertension   OSA (obstructive sleep apnea)   DM (diabetes mellitus) (HCC)   CKD (chronic kidney disease) stage 5, GFR less than 15 ml/min (HCC)   Nausea vomiting and diarrhea   Morbid (severe) obesity due to excess calories (HCC)    Acute kidney injury on CKD stage V -Baseline creatinine 4.0, admission creatinine 7.56.  Suspect from nausea vomiting, prerenal.  Creatinine still remains elevated at 6.81 despite of IV fluids.  Holding losartan and Aldactone.  Nephro following.    Nausea and vomiting with heartburn type symptoms - Unclear etiology.  LFTs, lipase are normal.  CT abdomen pelvis without contrast is unremarkable.  Seen by GI. PPI BID for now, may eventually need Endo. Seen by GI   Insulin-dependent diabetes mellitus type 2 - While in acute kidney injury, Lower dose of insulin.   Semglee to 16 units twice daily.  Sliding scale and Accu-Cheks   Tracheostomy dependence - Patient follows outpatient pulmonary.  Recently has undergone work-up for concerns of vocal cord dysfunction/subglottic stenosis.  ENT and pulmonary recommending periodic trach change, last trach change 2 days ago by outpatient pulmonary.  We will closely  monitor here, if necessary we can get pulmonary involved   Essential hypertension, uncontrolled - Norvasc, Coreg, clonidine, hydralazine ordered.  IV as needed ordered   GERD - PPI    DVT prophylaxis: SQ. heparin Code Status: Full code Family Communication:    Maintain hospital stay until renal function improves GI and Nephro following.    Subjective: Feels okay no complaints, denies any shortness of breath. Overall she is very pleasant  Examination: Constitutional: Not in acute distress Respiratory: Clear to auscultation bilaterally Cardiovascular: Normal sinus rhythm, no rubs Abdomen: Nontender nondistended good bowel sounds Musculoskeletal: No edema noted Skin: No rashes seen Neurologic: CN 2-12 grossly intact.  And nonfocal Psychiatric: Normal judgment and insight. Alert and oriented x 3. Normal mood. Tracheostomy in place  Objective: Vitals:   10/08/22 0235 10/08/22 0500 10/08/22 0534 10/08/22 0821  BP:   (!) 148/88 (!) 170/75  Pulse:   74 78  Resp:   17 18  Temp:   98.8 F (37.1 C) 98.5 F (36.9 C)  TempSrc:   Oral Oral  SpO2: 100%  94% 96%  Weight:  106.5 kg      Intake/Output Summary (Last 24 hours) at 10/08/2022 0841 Last data filed at 10/08/2022 0300 Gross per 24 hour  Intake 2556.39 ml  Output --  Net 2556.39 ml   Filed Weights   10/07/22 0535 10/08/22 0500  Weight: 104.6 kg 106.5 kg     Data Reviewed:   CBC: Recent Labs  Lab 10/05/22 1057 10/06/22 0330 10/07/22 0351 10/08/22 0200  WBC 13.1* 16.9* 12.3* 11.2*  NEUTROABS 11.4*  --   --   --  HGB 9.3* 8.9* 7.4* 7.4*  HCT 29.7* 28.3* 22.6* 23.4*  MCV 91.7 91.6 89.0 89.7  PLT 240 239 196 720*   Basic Metabolic Panel: Recent Labs  Lab 10/05/22 1057 10/06/22 0330 10/06/22 1209 10/07/22 0351 10/08/22 0200  NA 143 139  --  136 135  K 4.9 4.7  --  4.3 4.6  CL 108 108  --  109 108  CO2 20* 19*  --  19* 17*  GLUCOSE 250* 200*  --  110* 97  BUN 67* 63*  --  57* 57*  CREATININE  7.56* 6.85* 6.99* 6.87* 6.81*  CALCIUM 9.1 8.3*  --  8.1* 8.0*  MG  --  1.3*  --  2.1 1.8   GFR: Estimated Creatinine Clearance: 11.5 mL/min (A) (by C-G formula based on SCr of 6.81 mg/dL (H)). Liver Function Tests: Recent Labs  Lab 10/05/22 1057  AST 30  ALT 17  ALKPHOS 114  BILITOT 0.7  PROT 7.1  ALBUMIN 3.4*   Recent Labs  Lab 10/05/22 1057  LIPASE 40   No results for input(s): "AMMONIA" in the last 168 hours. Coagulation Profile: No results for input(s): "INR", "PROTIME" in the last 168 hours. Cardiac Enzymes: No results for input(s): "CKTOTAL", "CKMB", "CKMBINDEX", "TROPONINI" in the last 168 hours. BNP (last 3 results) Recent Labs    04/01/22 1147  PROBNP 306*   HbA1C: No results for input(s): "HGBA1C" in the last 72 hours. CBG: Recent Labs  Lab 10/07/22 0820 10/07/22 1225 10/07/22 1642 10/07/22 2052 10/08/22 0819  GLUCAP 114* 114* 114* 170* 81   Lipid Profile: No results for input(s): "CHOL", "HDL", "LDLCALC", "TRIG", "CHOLHDL", "LDLDIRECT" in the last 72 hours. Thyroid Function Tests: No results for input(s): "TSH", "T4TOTAL", "FREET4", "T3FREE", "THYROIDAB" in the last 72 hours. Anemia Panel: No results for input(s): "VITAMINB12", "FOLATE", "FERRITIN", "TIBC", "IRON", "RETICCTPCT" in the last 72 hours. Sepsis Labs: No results for input(s): "PROCALCITON", "LATICACIDVEN" in the last 168 hours.  Recent Results (from the past 240 hour(s))  Resp Panel by RT-PCR (Flu A&B, Covid) Anterior Nasal Swab     Status: None   Collection Time: 10/05/22  5:11 PM   Specimen: Anterior Nasal Swab  Result Value Ref Range Status   SARS Coronavirus 2 by RT PCR NEGATIVE NEGATIVE Final    Comment: (NOTE) SARS-CoV-2 target nucleic acids are NOT DETECTED.  The SARS-CoV-2 RNA is generally detectable in upper respiratory specimens during the acute phase of infection. The lowest concentration of SARS-CoV-2 viral copies this assay can detect is 138 copies/mL. A negative  result does not preclude SARS-Cov-2 infection and should not be used as the sole basis for treatment or other patient management decisions. A negative result may occur with  improper specimen collection/handling, submission of specimen other than nasopharyngeal swab, presence of viral mutation(s) within the areas targeted by this assay, and inadequate number of viral copies(<138 copies/mL). A negative result must be combined with clinical observations, patient history, and epidemiological information. The expected result is Negative.  Fact Sheet for Patients:  EntrepreneurPulse.com.au  Fact Sheet for Healthcare Providers:  IncredibleEmployment.be  This test is no t yet approved or cleared by the [place] FDA and  has been authorized for detection and/or diagnosis of SARS-CoV-2 by FDA under an Emergency Use Authorization (EUA). This EUA will remain  in effect (meaning this test can be used) for the duration of the COVID-19 declaration under Section 564(b)(1) of the Act, 21 U.S.C.section 360bbb-3(b)(1), unless the authorization is terminated  or revoked  sooner.       Influenza A by PCR NEGATIVE NEGATIVE Final   Influenza B by PCR NEGATIVE NEGATIVE Final    Comment: (NOTE) The Xpert Xpress SARS-CoV-2/FLU/RSV plus assay is intended as an aid in the diagnosis of influenza from Nasopharyngeal swab specimens and should not be used as a sole basis for treatment. Nasal washings and aspirates are unacceptable for Xpert Xpress SARS-CoV-2/FLU/RSV testing.  Fact Sheet for Patients: EntrepreneurPulse.com.au  Fact Sheet for Healthcare Providers: IncredibleEmployment.be  This test is not yet approved or cleared by the [place] FDA and has been authorized for detection and/or diagnosis of SARS-CoV-2 by FDA under an Emergency Use Authorization (EUA). This EUA will remain in effect (meaning this test can be used)  for the duration of the COVID-19 declaration under Section 564(b)(1) of the Act, 21 U.S.C. section 360bbb-3(b)(1), unless the authorization is terminated or revoked.  Performed at [facility], [place] [address], [place], [place] [address]          Radiology Studies: DG Abd 1 View  Result Date: 10/07/2022 CLINICAL DATA:  Abdominal distension. EXAM: ABDOMEN - 1 VIEW COMPARISON:  CT of the abdomen and pelvis 10/05/2022 FINDINGS: Gaseous distension of the transverse colon is noted. Bowel gas pattern is otherwise unremarkable. No stone or mass lesion is present. IMPRESSION: Gaseous distension of the transverse colon without obstruction. Electronically Signed   By: San Morelle M.D.   On: 10/07/2022 12:45        Scheduled Meds:  amLODipine  10 mg Oral Daily   atorvastatin  20 mg Oral Daily   carvedilol  25 mg Oral BID WC   cloNIDine  0.1 mg Oral BID   famotidine  20 mg Oral QHS   heparin  5,000 Units Subcutaneous Q8H   hydrALAZINE  100 mg Oral TID   insulin aspart  0-5 Units Subcutaneous QHS   insulin aspart  0-9 Units Subcutaneous TID WC   insulin glargine-yfgn  16 Units Subcutaneous BID   pantoprazole  40 mg Oral BID AC   traZODone  50 mg Oral QHS   Continuous Infusions:  sodium chloride 75 mL/hr at 10/08/22 0006     LOS: 3 days   Time spent= 35 mins    Schuyler Behan Arsenio Loader, MD [facility]  If 7PM-7AM, please contact night-coverage  10/08/2022, 8:41 AM

## 2022-10-09 DIAGNOSIS — N179 Acute kidney failure, unspecified: Secondary | ICD-10-CM | POA: Diagnosis not present

## 2022-10-09 LAB — CBC
HCT: 22.4 % — ABNORMAL LOW (ref 36.0–46.0)
Hemoglobin: 7.1 g/dL — ABNORMAL LOW (ref 12.0–15.0)
MCH: 28.7 pg (ref 26.0–34.0)
MCHC: 31.7 g/dL (ref 30.0–36.0)
MCV: 90.7 fL (ref 80.0–100.0)
Platelets: 160 10*3/uL (ref 150–400)
RBC: 2.47 MIL/uL — ABNORMAL LOW (ref 3.87–5.11)
RDW: 12.7 % (ref 11.5–15.5)
WBC: 10.2 10*3/uL (ref 4.0–10.5)
nRBC: 0 % (ref 0.0–0.2)

## 2022-10-09 LAB — BASIC METABOLIC PANEL
Anion gap: 10 (ref 5–15)
BUN: 55 mg/dL — ABNORMAL HIGH (ref 6–20)
CO2: 17 mmol/L — ABNORMAL LOW (ref 22–32)
Calcium: 8.1 mg/dL — ABNORMAL LOW (ref 8.9–10.3)
Chloride: 105 mmol/L (ref 98–111)
Creatinine, Ser: 7.13 mg/dL — ABNORMAL HIGH (ref 0.44–1.00)
GFR, Estimated: 6 mL/min — ABNORMAL LOW (ref >60)
Glucose, Bld: 175 mg/dL — ABNORMAL HIGH (ref 70–99)
Potassium: 4.4 mmol/L (ref 3.5–5.1)
Sodium: 132 mmol/L — ABNORMAL LOW (ref 135–145)

## 2022-10-09 LAB — PHOSPHORUS: Phosphorus: 4.8 mg/dL — ABNORMAL HIGH (ref 2.5–4.6)

## 2022-10-09 LAB — MAGNESIUM: Magnesium: 1.8 mg/dL (ref 1.7–2.4)

## 2022-10-09 LAB — GLUCOSE, CAPILLARY
Glucose-Capillary: 120 mg/dL — ABNORMAL HIGH (ref 70–99)
Glucose-Capillary: 127 mg/dL — ABNORMAL HIGH (ref 70–99)
Glucose-Capillary: 167 mg/dL — ABNORMAL HIGH (ref 70–99)
Glucose-Capillary: 162 mg/dL — ABNORMAL HIGH (ref 70–99)

## 2022-10-09 MED ORDER — CLONIDINE HCL 0.2 MG PO TABS
0.2000 mg | ORAL_TABLET | Freq: Three times a day (TID) | ORAL | Status: DC
  Administered 2022-10-09 – 2022-10-10 (×6): 0.2 mg via ORAL
  Filled 2022-10-09 (×6): qty 1

## 2022-10-09 MED ORDER — SODIUM CHLORIDE 0.9 % IV SOLN
250.0000 mg | Freq: Once | INTRAVENOUS | Status: AC
  Administered 2022-10-09: 250 mg via INTRAVENOUS
  Filled 2022-10-09: qty 20

## 2022-10-09 MED ORDER — DARBEPOETIN ALFA 60 MCG/0.3ML IJ SOSY
60.0000 ug | PREFILLED_SYRINGE | Freq: Once | INTRAMUSCULAR | Status: AC
  Administered 2022-10-09: 60 ug via SUBCUTANEOUS
  Filled 2022-10-09: qty 0.3

## 2022-10-09 NOTE — Progress Notes (Addendum)
[facility] Progress Note   Subjective:   I/Os 0.25 / 1.1L UOP- normal urination.  Normal PO intake now.  No dysgeusia, hiccups, confusion, edema, orthopnea.  Feels back to baseline. BUN stable 50s, Cr 6.8 > 7.1  Objective Vitals:   10/09/22 0403 10/09/22 0500 10/09/22 0806 10/09/22 0832  BP: (!) 154/61  (!) 166/83   Pulse: 78  73 74  Resp: 19  16 16   Temp: 98.7 F (37.1 C)  98.2 F (36.8 C)   TempSrc: Oral  Oral   SpO2: 100%  100% 94%  Weight:  108.7 kg     Physical Exam General: obese woman who is comfortable Heart:RRR no rub Lungs: clear, trach collar Abdomen: soft, obese Extremities: no edema Neuro:  nonfocal   Additional Objective Labs: Basic Metabolic Panel: Recent Labs  Lab 10/07/22 0351 10/08/22 0200 10/09/22 0214  NA 136 135 132*  K 4.3 4.6 4.4  CL 109 108 105  CO2 19* 17* 17*  GLUCOSE 110* 97 175*  BUN 57* 57* 55*  CREATININE 6.87* 6.81* 7.13*  CALCIUM 8.1* 8.0* 8.1*  PHOS  --   --  4.8*    Liver Function Tests: Recent Labs  Lab 10/05/22 1057  AST 30  ALT 17  ALKPHOS 114  BILITOT 0.7  PROT 7.1  ALBUMIN 3.4*    Recent Labs  Lab 10/05/22 1057  LIPASE 40    CBC: Recent Labs  Lab 10/05/22 1057 10/06/22 0330 10/07/22 0351 10/08/22 0200 10/09/22 0214  WBC 13.1* 16.9* 12.3* 11.2* 10.2  NEUTROABS 11.4*  --   --   --   --   HGB 9.3* 8.9* 7.4* 7.4* 7.1*  HCT 29.7* 28.3* 22.6* 23.4* 22.4*  MCV 91.7 91.6 89.0 89.7 90.7  PLT 240 239 196 146* 160    Blood Culture    Component Value Date/Time   SDES URINE, CLEAN CATCH 02/13/2022 2256   SPECREQUEST NONE 02/13/2022 2256   CULT (A) 02/13/2022 2256    80,000 COLONIES/mL AEROCOCCUS SPECIES Standardized susceptibility testing for this organism is not available. Performed at [facility], [place] [address], [place], [place] [address]    REPTSTATUS 02/15/2022 FINAL 02/13/2022 2256    Cardiac Enzymes: No results for input(s): "CKTOTAL", "CKMB", "CKMBINDEX", "TROPONINI"  in the last 168 hours. CBG: Recent Labs  Lab 10/08/22 0819 10/08/22 1215 10/08/22 1647 10/08/22 2114 10/09/22 0803  GLUCAP 81 127* 140* 158* 120*    Iron Studies:  Recent Labs    10/08/22 1303  IRON 51  TIBC 224*  FERRITIN 145   @lablastinr3 @ Studies/Results: DG Abd 1 View  Result Date: 10/07/2022 CLINICAL DATA:  Abdominal distension. EXAM: ABDOMEN - 1 VIEW COMPARISON:  CT of the abdomen and pelvis 10/05/2022 FINDINGS: Gaseous distension of the transverse colon is noted. Bowel gas pattern is otherwise unremarkable. No stone or mass lesion is present. IMPRESSION: Gaseous distension of the transverse colon without obstruction. Electronically Signed   By: San Morelle M.D.   On: 10/07/2022 12:45   Medications:    amLODipine  10 mg Oral Daily   atorvastatin  20 mg Oral Daily   carvedilol  25 mg Oral BID WC   cloNIDine  0.2 mg Oral TID   famotidine  20 mg Oral QHS   heparin  5,000 Units Subcutaneous Q8H   hydrALAZINE  100 mg Oral TID   insulin aspart  0-5 Units Subcutaneous QHS   insulin aspart  0-9 Units Subcutaneous TID WC   insulin glargine-yfgn  16 Units Subcutaneous BID   pantoprazole  40 mg Oral BID AC   traZODone  50 mg Oral QHS    Assessment/Recommendations: Maureen Jones is a/an 56 y.o. 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 She was admitted for AKI.    Non-Oliguric AKI on CKD V:  suspect prerenal AKI progressed to ATN in the setting of V/D/poor po intake on background of advanced CKD.   No evidence of obstruction on CT abdomen/pelvis 10/25.  Suspect she has CKD secondary to poorly controlled HTN and DM. She had an outpt nephrology consult in 2021 at [place] - noted to have nephrotic range proteinuria, neg serologic w/u and never followed up.  I do not think a renal  biopsy would change current management.  -cont po intake, no need for IVF -Cont holding losartan and aldactone  -Currently no indication for HD - given her trach long term HD would be more challenging  -Continue to monitor daily labs -Dose meds for GFR<15 -Monitor Daily I/Os, Daily weight  -- d/w RN today must have UOP recorded -Maintain MAP>65 for optimal renal perfusion.  -Avoid further nephrotoxins including NSAIDS, Morphine.  Unless absolutely necessary, avoid CT with contrast and/or MRI with gadolinium.      Normocytic anemia: Hb in 7s, no bleeding -iron sat 23%, will give 1 dose IV iron and start ESA -Transfuse Hgb <7.0    Nausea and vomiting with heartburn symptoms  LFTs, lipase normal. CT abdomen pelvis without contrast unremarkable, no gallstones seen. Continue antiemetics and supportive care. PPI, Pepcid started.    Insulin-dependent diabetes mellitus type 2 Per primary team- Semglee 16 units twice daily. 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 Has recently undergone work-up for concerns of vocal cord dysfunction/subglottic stenosis. ENT and pulmonary recommend periodic trach changes, last trach change 2 days ago OP.    Hypertension, uncontrolled SBP has been in 157-183 range. Patient has had difficulty controlling BP since hospitalization last fall.  -Norvasc, Coreg, clonidine, hydralazine ordered > titrate clonidine to TID   DM: longstanding poorly controlled.  Gluc control per primary.  ACEi/ARB and SGLT2i use limited by current GFR - resume as able.   Dispo: must remain inpt for now given severity of AKI and need for daily lab monitoring.  She saw [facility] nephrology outpt x1 but never went back due to transportation.  Should she avoid dialysis this admission she needs to f/u with outpt nephrology regularly due to advanced CKD.     Jannifer Hick MD [facility] Pager [phone]

## 2022-10-09 NOTE — Progress Notes (Signed)
PROGRESS NOTE    Maureen Jones  ZTI:[id number] DOB: Jul 05, 1966 DOA: 10/05/2022 PCP: Flossie Buffy, NP   Brief Narrative:  56 y.o. 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 She was admitted for acute kidney injury, slowly improving with IV fluids.  Creatinine started plateauing therefore nephrology consulted.  Also has persistent heartburn despite of Pepcid and PPI, GI consulted.  GI recommended treating with PPI twice daily and outpatient follow-up for endoscopy   Assessment & Plan:  Principal Problem:   AKI (acute kidney injury) ([place]) Active Problems:   Vocal cord dysfunction   Tracheostomy dependence ([place])   Resistant hypertension   OSA (obstructive sleep apnea)   DM (diabetes mellitus) (HCC)   CKD (chronic kidney disease) stage 5, GFR less than 15 ml/min (HCC)   Nausea vomiting and diarrhea   Morbid (severe) obesity due to excess calories (HCC)    Acute kidney injury on CKD stage V -Baseline creatinine 4.0, admission creatinine 7.56.  Suspect from nausea vomiting, prerenal.  Holding losartan and Aldactone.  Creatinine still remains elevated, today 7.13.  Nephrology following.   Nausea and vomiting with heartburn type symptoms, improved - Unclear etiology.  LFTs, lipase are normal.  CT abdomen pelvis without contrast is unremarkable.  Seen by GI, recommend outpatient endoscopy.  Continue PPI twice daily upon discharge  Anemia of chronic disease - Hemoglobin down to 7.1.  If drops any further patient will require transfusion.  Iron studies stable. She doesn't want transfusion yet.    Insulin-dependent diabetes mellitus type 2 - While in acute kidney injury, Lower dose of insulin.   Semglee to 16 units twice daily.  Sliding scale and Accu-Cheks   Tracheostomy  dependence - Patient follows outpatient pulmonary.  Recently has undergone work-up for concerns of vocal cord dysfunction/subglottic stenosis.  ENT and pulmonary recommending periodic trach change, last trach change 2 days ago by outpatient pulmonary.  We will closely monitor here, if necessary we can get pulmonary involved   Essential hypertension, uncontrolled - Continue Norvasc and hydralazine.  We will increase clonidine 0.2 mg 3 times daily. -IV as needed   GERD - PPI    DVT prophylaxis: SQ. heparin Code Status: Full code Family Communication:    Maintain hospital stay until renal function improves GI and Nephro following.    Subjective: Feels ok, slightly fatigue.   Examination: Constitutional: Not in acute distress Respiratory: Clear to auscultation bilaterally Cardiovascular: Normal sinus rhythm, no rubs Abdomen: Nontender nondistended good bowel sounds Musculoskeletal: No edema noted Skin: No rashes seen Neurologic: CN 2-12 grossly intact.  And nonfocal Psychiatric: Normal judgment and insight. Alert and oriented x 3. Normal mood.   Tracheostomy in place  Objective: Vitals:   10/09/22 0218 10/09/22 0403 10/09/22 0500 10/09/22 0806  BP:  (!) 154/61  (!) 166/83  Pulse: 78 78  73  Resp: 18 19  16   Temp:  98.7 F (37.1 C)  98.2 F (36.8 C)  TempSrc:  Oral  Oral  SpO2: 98% 100%  100%  Weight:   108.7 kg     Intake/Output Summary (Last 24 hours) at 10/09/2022 0818 Last data filed at 10/08/2022 2200 Gross per 24 hour  Intake 250 ml  Output 1100 ml  Net -850 ml   Filed Weights   10/07/22 0535 10/08/22 0500 10/09/22 0500  Weight: 104.6 kg  106.5 kg 108.7 kg     Data Reviewed:   CBC: Recent Labs  Lab 10/05/22 1057 10/06/22 0330 10/07/22 0351 10/08/22 0200 10/09/22 0214  WBC 13.1* 16.9* 12.3* 11.2* 10.2  NEUTROABS 11.4*  --   --   --   --   HGB 9.3* 8.9* 7.4* 7.4* 7.1*  HCT 29.7* 28.3* 22.6* 23.4* 22.4*  MCV 91.7 91.6 89.0 89.7 90.7  PLT 240  239 196 146* 809   Basic Metabolic Panel: Recent Labs  Lab 10/05/22 1057 10/06/22 0330 10/06/22 1209 10/07/22 0351 10/08/22 0200 10/09/22 0214  NA 143 139  --  136 135 132*  K 4.9 4.7  --  4.3 4.6 4.4  CL 108 108  --  109 108 105  CO2 20* 19*  --  19* 17* 17*  GLUCOSE 250* 200*  --  110* 97 175*  BUN 67* 63*  --  57* 57* 55*  CREATININE 7.56* 6.85* 6.99* 6.87* 6.81* 7.13*  CALCIUM 9.1 8.3*  --  8.1* 8.0* 8.1*  MG  --  1.3*  --  2.1 1.8 1.8  PHOS  --   --   --   --   --  4.8*   GFR: Estimated Creatinine Clearance: 11.1 mL/min (A) (by C-G formula based on SCr of 7.13 mg/dL (H)). Liver Function Tests: Recent Labs  Lab 10/05/22 1057  AST 30  ALT 17  ALKPHOS 114  BILITOT 0.7  PROT 7.1  ALBUMIN 3.4*   Recent Labs  Lab 10/05/22 1057  LIPASE 40   No results for input(s): "AMMONIA" in the last 168 hours. Coagulation Profile: No results for input(s): "INR", "PROTIME" in the last 168 hours. Cardiac Enzymes: No results for input(s): "CKTOTAL", "CKMB", "CKMBINDEX", "TROPONINI" in the last 168 hours. BNP (last 3 results) Recent Labs    04/01/22 1147  PROBNP 306*   HbA1C: No results for input(s): "HGBA1C" in the last 72 hours. CBG: Recent Labs  Lab 10/08/22 0819 10/08/22 1215 10/08/22 1647 10/08/22 2114 10/09/22 0803  GLUCAP 81 127* 140* 158* 120*   Lipid Profile: No results for input(s): "CHOL", "HDL", "LDLCALC", "TRIG", "CHOLHDL", "LDLDIRECT" in the last 72 hours. Thyroid Function Tests: No results for input(s): "TSH", "T4TOTAL", "FREET4", "T3FREE", "THYROIDAB" in the last 72 hours. Anemia Panel: Recent Labs    10/08/22 1303  FERRITIN 145  TIBC 224*  IRON 51   Sepsis Labs: No results for input(s): "PROCALCITON", "LATICACIDVEN" in the last 168 hours.  Recent Results (from the past 240 hour(s))  Resp Panel by RT-PCR (Flu A&B, Covid) Anterior Nasal Swab     Status: None   Collection Time: 10/05/22  5:11 PM   Specimen: Anterior Nasal Swab  Result Value  Ref Range Status   SARS Coronavirus 2 by RT PCR NEGATIVE NEGATIVE Final    Comment: (NOTE) SARS-CoV-2 target nucleic acids are NOT DETECTED.  The SARS-CoV-2 RNA is generally detectable in upper respiratory specimens during the acute phase of infection. The lowest concentration of SARS-CoV-2 viral copies this assay can detect is 138 copies/mL. A negative result does not preclude SARS-Cov-2 infection and should not be used as the sole basis for treatment or other patient management decisions. A negative result may occur with  improper specimen collection/handling, submission of specimen other than nasopharyngeal swab, presence of viral mutation(s) within the areas targeted by this assay, and inadequate number of viral copies(<138 copies/mL). A negative result must be combined with clinical observations, patient history, and epidemiological information. The expected result is Negative.  Fact Sheet for Patients:  EntrepreneurPulse.com.au  Fact Sheet for Healthcare Providers:  IncredibleEmployment.be  This test is no t yet approved or cleared by the [place] FDA and  has been authorized for detection and/or diagnosis of SARS-CoV-2 by FDA under an Emergency Use Authorization (EUA). This EUA will remain  in effect (meaning this test can be used) for the duration of the COVID-19 declaration under Section 564(b)(1) of the Act, 21 U.S.C.section 360bbb-3(b)(1), unless the authorization is terminated  or revoked sooner.       Influenza A by PCR NEGATIVE NEGATIVE Final   Influenza B by PCR NEGATIVE NEGATIVE Final    Comment: (NOTE) The Xpert Xpress SARS-CoV-2/FLU/RSV plus assay is intended as an aid in the diagnosis of influenza from Nasopharyngeal swab specimens and should not be used as a sole basis for treatment. Nasal washings and aspirates are unacceptable for Xpert Xpress SARS-CoV-2/FLU/RSV testing.  Fact Sheet for  Patients: EntrepreneurPulse.com.au  Fact Sheet for Healthcare Providers: IncredibleEmployment.be  This test is not yet approved or cleared by the [place] FDA and has been authorized for detection and/or diagnosis of SARS-CoV-2 by FDA under an Emergency Use Authorization (EUA). This EUA will remain in effect (meaning this test can be used) for the duration of the COVID-19 declaration under Section 564(b)(1) of the Act, 21 U.S.C. section 360bbb-3(b)(1), unless the authorization is terminated or revoked.  Performed at [facility], [place] [address] [place], [place] [address]          Radiology Studies: DG Abd 1 View  Result Date: 10/07/2022 CLINICAL DATA:  Abdominal distension. EXAM: ABDOMEN - 1 VIEW COMPARISON:  CT of the abdomen and pelvis 10/05/2022 FINDINGS: Gaseous distension of the transverse colon is noted. Bowel gas pattern is otherwise unremarkable. No stone or mass lesion is present. IMPRESSION: Gaseous distension of the transverse colon without obstruction. Electronically Signed   By: San Morelle M.D.   On: 10/07/2022 12:45        Scheduled Meds:  amLODipine  10 mg Oral Daily   atorvastatin  20 mg Oral Daily   carvedilol  25 mg Oral BID WC   cloNIDine  0.2 mg Oral TID   famotidine  20 mg Oral QHS   heparin  5,000 Units Subcutaneous Q8H   hydrALAZINE  100 mg Oral TID   insulin aspart  0-5 Units Subcutaneous QHS   insulin aspart  0-9 Units Subcutaneous TID WC   insulin glargine-yfgn  16 Units Subcutaneous BID   pantoprazole  40 mg Oral BID AC   traZODone  50 mg Oral QHS   Continuous Infusions:     LOS: 4 days   Time spent= 35 mins    Glennda Weatherholtz Arsenio Loader, MD [facility]  If 7PM-7AM, please contact night-coverage  10/09/2022, 8:18 AM

## 2022-10-10 ENCOUNTER — Telehealth: Payer: Self-pay

## 2022-10-10 DIAGNOSIS — N179 Acute kidney failure, unspecified: Secondary | ICD-10-CM | POA: Diagnosis not present

## 2022-10-10 LAB — CBC
HCT: 22.2 % — ABNORMAL LOW (ref 36.0–46.0)
Hemoglobin: 7.2 g/dL — ABNORMAL LOW (ref 12.0–15.0)
MCH: 29.5 pg (ref 26.0–34.0)
MCHC: 32.4 g/dL (ref 30.0–36.0)
MCV: 91 fL (ref 80.0–100.0)
Platelets: 169 10*3/uL (ref 150–400)
RBC: 2.44 MIL/uL — ABNORMAL LOW (ref 3.87–5.11)
RDW: 12.7 % (ref 11.5–15.5)
WBC: 9.2 10*3/uL (ref 4.0–10.5)
nRBC: 0 % (ref 0.0–0.2)

## 2022-10-10 LAB — GLUCOSE, CAPILLARY
Glucose-Capillary: 155 mg/dL — ABNORMAL HIGH (ref 70–99)
Glucose-Capillary: 159 mg/dL — ABNORMAL HIGH (ref 70–99)
Glucose-Capillary: 142 mg/dL — ABNORMAL HIGH (ref 70–99)
Glucose-Capillary: 213 mg/dL — ABNORMAL HIGH (ref 70–99)

## 2022-10-10 LAB — BASIC METABOLIC PANEL
Anion gap: 9 (ref 5–15)
BUN: 65 mg/dL — ABNORMAL HIGH (ref 6–20)
CO2: 18 mmol/L — ABNORMAL LOW (ref 22–32)
Calcium: 8.4 mg/dL — ABNORMAL LOW (ref 8.9–10.3)
Chloride: 109 mmol/L (ref 98–111)
Creatinine, Ser: 7.25 mg/dL — ABNORMAL HIGH (ref 0.44–1.00)
GFR, Estimated: 6 mL/min — ABNORMAL LOW (ref >60)
Glucose, Bld: 155 mg/dL — ABNORMAL HIGH (ref 70–99)
Potassium: 4.9 mmol/L (ref 3.5–5.1)
Sodium: 136 mmol/L (ref 135–145)

## 2022-10-10 LAB — MAGNESIUM: Magnesium: 1.9 mg/dL (ref 1.7–2.4)

## 2022-10-10 MED ORDER — POLYETHYLENE GLYCOL 3350 17 G PO PACK
17.0000 g | PACK | Freq: Every day | ORAL | Status: DC | PRN
  Administered 2022-10-13: 17 g via ORAL
  Filled 2022-10-10: qty 1

## 2022-10-10 MED ORDER — SALINE SPRAY 0.65 % NA SOLN
1.0000 | NASAL | Status: DC | PRN
  Administered 2022-10-12: 1 via NASAL
  Filled 2022-10-10: qty 44

## 2022-10-10 NOTE — Care Management Important Message (Signed)
Important Message  Patient Details  Name: Maureen Jones MRN: [id number] Date of Birth: December 01, 1966   Medicare Important Message Given:  Yes     Hannah Beat 10/10/2022, 12:01 PM

## 2022-10-10 NOTE — TOC Progression Note (Addendum)
Transition of Care [facility]) - Progression Note    Patient Details  Name: Chauna Osoria MRN: [id number] Date of Birth: November 11, 1966  Transition of Care [facility]) CM/SW Contact  Audwin Semper, Edson Snowball, RN Phone Number: 10/10/2022, 1:03 PM  Clinical Narrative:    Left Zach with [place] a message with patient's sister's updated phone number [phone] to have her home oxygen concentrator looked at. Await call back   Thedore Mins returned call and has updated phone number. [employer] will reach out to [name] to check concentrator    Expected Discharge Plan: Home/Self Care Barriers to Discharge: Continued Medical Work up  Expected Discharge Plan and Services Expected Discharge Plan: Home/Self Care   Discharge Planning Services: CM Consult   Living arrangements for the past 2 months: Single Family Home                 DME Arranged:  (see note)         HH Arranged: NA           Social Determinants of Health (SDOH) Interventions    Readmission Risk Interventions    12/13/2021   12:35 PM 11/15/2021   10:13 AM  Readmission Risk Prevention Plan  Transportation Screening Complete Complete  PCP or Specialist Appt within 3-5 Days Complete Complete  HRI or Home Care Consult Complete Complete  Social Work Consult for [place] Planning/Counseling Complete Patient refused  Palliative Care Screening Not Applicable Not Applicable  Medication Review [employer]) Complete Complete

## 2022-10-10 NOTE — Telephone Encounter (Unsigned)
Left message for pt to call back  °

## 2022-10-10 NOTE — Progress Notes (Signed)
[facility] Progress Note   Subjective:   Feeling well, no issues.  Cr continues to rise but no uremic symptoms  Objective Vitals:   10/10/22 0414 10/10/22 0415 10/10/22 0732 10/10/22 0835  BP:    (!) 160/65  Pulse: 74  71 70  Resp: 18  17 18   Temp:    98.7 F (37.1 C)  TempSrc:    Oral  SpO2: 100%  100% 100%  Weight:  111.9 kg     Physical Exam General: NAD Heart:RRR no rub Lungs: clear, + trach Abdomen: soft, obese Extremities: no edema Neuro:  nonfocal   Additional Objective Labs: Basic Metabolic Panel: Recent Labs  Lab 10/08/22 0200 10/09/22 0214 10/10/22 0235  NA 135 132* 136  K 4.6 4.4 4.9  CL 108 105 109  CO2 17* 17* 18*  GLUCOSE 97 175* 155*  BUN 57* 55* 65*  CREATININE 6.81* 7.13* 7.25*  CALCIUM 8.0* 8.1* 8.4*  PHOS  --  4.8*  --    Liver Function Tests: Recent Labs  Lab 10/05/22 1057  AST 30  ALT 17  ALKPHOS 114  BILITOT 0.7  PROT 7.1  ALBUMIN 3.4*   Recent Labs  Lab 10/05/22 1057  LIPASE 40   CBC: Recent Labs  Lab 10/05/22 1057 10/06/22 0330 10/07/22 0351 10/08/22 0200 10/09/22 0214 10/10/22 0235  WBC 13.1* 16.9* 12.3* 11.2* 10.2 9.2  NEUTROABS 11.4*  --   --   --   --   --   HGB 9.3* 8.9* 7.4* 7.4* 7.1* 7.2*  HCT 29.7* 28.3* 22.6* 23.4* 22.4* 22.2*  MCV 91.7 91.6 89.0 89.7 90.7 91.0  PLT 240 239 196 146* 160 169   Blood Culture    Component Value Date/Time   SDES URINE, CLEAN CATCH 02/13/2022 2256   SPECREQUEST NONE 02/13/2022 2256   CULT (A) 02/13/2022 2256    80,000 COLONIES/mL AEROCOCCUS SPECIES Standardized susceptibility testing for this organism is not available. Performed at [facility], [place] [address] [place], [place] [address]    REPTSTATUS 02/15/2022 FINAL 02/13/2022 2256    Cardiac Enzymes: No results for input(s): "CKTOTAL", "CKMB", "CKMBINDEX", "TROPONINI" in the last 168 hours. CBG: Recent Labs  Lab 10/09/22 1138 10/09/22 1712 10/09/22 2129 10/10/22 0827 10/10/22 1216   GLUCAP 127* 167* 162* 155* 159*   Iron Studies:  Recent Labs    10/08/22 1303  IRON 51  TIBC 224*  FERRITIN 145   @lablastinr3 @ Studies/Results: No results found. Medications:    amLODipine  10 mg Oral Daily   atorvastatin  20 mg Oral Daily   carvedilol  25 mg Oral BID WC   cloNIDine  0.2 mg Oral TID   famotidine  20 mg Oral QHS   heparin  5,000 Units Subcutaneous Q8H   hydrALAZINE  100 mg Oral TID   insulin aspart  0-5 Units Subcutaneous QHS   insulin aspart  0-9 Units Subcutaneous TID WC   insulin glargine-yfgn  16 Units Subcutaneous BID   pantoprazole  40 mg Oral BID AC   traZODone  50 mg Oral QHS    Assessment/Recommendations: Maureen Jones is a/an 56 y.o. 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 She was admitted for AKI.    Non-Oliguric AKI on CKD V:  suspect prerenal AKI progressed to ATN in the setting of V/D/poor  po intake on background of advanced CKD.   No evidence of obstruction on CT abdomen/pelvis 10/25.  Suspect she has CKD secondary to poorly controlled HTN and DM. She had an outpt nephrology consult in 2021 at [facility] - noted to have nephrotic range proteinuria, neg serologic w/u and never followed up.  I do not think a renal biopsy would change current management.  -cont po intake, no need for IVF -Cont holding losartan and aldactone  -Currently no indication for HD - she tells me today that she doesn't want dialysis if it comes to that - if Cr continues to rise may need pall care c/s -Continue to monitor daily labs -Dose meds for GFR<15 -Monitor Daily I/Os, Daily weight  -- d/w RN today must have UOP recorded -Maintain MAP>65 for optimal renal perfusion.  -Avoid further nephrotoxins including NSAIDS, Morphine.  Unless absolutely necessary, avoid CT with contrast  and/or MRI with gadolinium.      Normocytic anemia: Hb in 7s, no bleeding -iron sat 23%, will give 1 dose IV iron and start ESA -Transfuse Hgb <7.0    Nausea and vomiting with heartburn symptoms  LFTs, lipase normal. CT abdomen pelvis without contrast unremarkable, no gallstones seen. Continue antiemetics and supportive care. PPI, Pepcid started.    Insulin-dependent diabetes mellitus type 2 Per primary team- Semglee 16 units twice daily. 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 Has recently undergone work-up for concerns of vocal cord dysfunction/subglottic stenosis. ENT and pulmonary recommend periodic trach changes, last trach change 2 days ago OP.    Hypertension, uncontrolled SBP has been in 157-183 range. Patient has had difficulty controlling BP since hospitalization last fall.  -Norvasc, Coreg, clonidine, hydralazine ordered > titrate clonidine to TID   DM: longstanding poorly controlled.  Gluc control per primary.  ACEi/ARB and SGLT2i use limited by current GFR - resume as able.   Dispo: pending    Madelon Lips MD [place] Pager [phone]

## 2022-10-10 NOTE — Progress Notes (Signed)
PROGRESS NOTE    Maureen Jones  NOM:[id number] DOB: 12-Feb-1966 DOA: 10/05/2022 PCP: Flossie Buffy, NP   Brief Narrative:  56 y.o. 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 She was admitted for acute kidney injury, slowly improving with IV fluids.  Creatinine started plateauing therefore nephrology consulted.  Also has persistent heartburn despite of Pepcid and PPI, GI consulted.  GI recommended treating with PPI twice daily and outpatient follow-up for endoscopy.   Assessment & Plan:  Principal Problem:   AKI (acute kidney injury) ([place]) Active Problems:   Vocal cord dysfunction   Tracheostomy dependence (HCC)   Resistant hypertension   OSA (obstructive sleep apnea)   DM (diabetes mellitus) (HCC)   CKD (chronic kidney disease) stage 5, GFR less than 15 ml/min (HCC)   Nausea vomiting and diarrhea   Morbid (severe) obesity due to excess calories (HCC)    Acute kidney injury on CKD stage V -Baseline creatinine 4.0, admission creatinine 7.56.  Suspect from nausea vomiting, prerenal.  Holding losartan and Aldactone.  Creatinine still remains elevated, today 7.2.  Nephrology following.  Defer the need for HD to nephro   Nausea and vomiting with heartburn type symptoms, improved - Unclear etiology.  LFTs, lipase are normal.  CT abdomen pelvis without contrast is unremarkable.  Seen by GI, recommend outpatient endoscopy.  Continue PPI twice daily upon discharge  Anemia of chronic disease - Hemoglobin down to 7.1.  If drops any further patient will require transfusion.  Iron studies stable. She doesn't want transfusion yet.    Insulin-dependent diabetes mellitus type 2, acceptable range - While in acute kidney injury, Lower dose of insulin.   Semglee to 16 units twice daily.   Sliding scale and Accu-Cheks   Tracheostomy dependence - Patient follows outpatient pulmonary.  Recently has undergone work-up for concerns of vocal cord dysfunction/subglottic stenosis.  ENT and pulmonary recommending periodic trach change, last trach change 2 days ago by outpatient pulmonary.  We will closely monitor here, if necessary we can get pulmonary involved   Essential hypertension, uncontrolled - Continue Norvasc and hydralazine.  We will increase clonidine 0.2 mg 3 times daily. -IV as needed   GERD - PPI    DVT prophylaxis: SQ. heparin Code Status: Full code Family Communication:    Maintain hospital stay until renal function improves Will await nephrology clearance   Subjective: Seen and examined at bedside, sitting up in the chair eating her breakfast.  No complaints  Examination: Constitutional: Not in acute distress Respiratory: Clear to auscultation bilaterally Cardiovascular: Normal sinus rhythm, no rubs Abdomen: Nontender nondistended good bowel sounds Musculoskeletal: 2+ bilateral lower extremity pitting edema Skin: No rashes seen Neurologic: CN 2-12 grossly intact.  And nonfocal Psychiatric: Normal judgment and insight. Alert and oriented x 3. Normal mood.  Tracheostomy in place  Objective: Vitals:   10/10/22 0414 10/10/22 0415 10/10/22 0732 10/10/22 0835  BP:    (!) 160/65  Pulse: 74  71 70  Resp: 18  17 18   Temp:    98.7 F (37.1 C)  TempSrc:    Oral  SpO2: 100%  100% 100%  Weight:  111.9 kg      Intake/Output Summary (Last 24 hours) at 10/10/2022 0836 Last data filed at 10/10/2022 0415 Gross per 24 hour  Intake 520 ml  Output 600 ml  Net -  80 ml   Filed Weights   10/08/22 0500 10/09/22 0500 10/10/22 0415  Weight: 106.5 kg 108.7 kg 111.9 kg     Data Reviewed:   CBC: Recent Labs  Lab 10/05/22 1057 10/06/22 0330 10/07/22 0351 10/08/22 0200 10/09/22 0214 10/10/22 0235  WBC 13.1* 16.9* 12.3* 11.2* 10.2 9.2  NEUTROABS 11.4*   --   --   --   --   --   HGB 9.3* 8.9* 7.4* 7.4* 7.1* 7.2*  HCT 29.7* 28.3* 22.6* 23.4* 22.4* 22.2*  MCV 91.7 91.6 89.0 89.7 90.7 91.0  PLT 240 239 196 146* 160 829   Basic Metabolic Panel: Recent Labs  Lab 10/06/22 0330 10/06/22 1209 10/07/22 0351 10/08/22 0200 10/09/22 0214 10/10/22 0235  NA 139  --  136 135 132* 136  K 4.7  --  4.3 4.6 4.4 4.9  CL 108  --  109 108 105 109  CO2 19*  --  19* 17* 17* 18*  GLUCOSE 200*  --  110* 97 175* 155*  BUN 63*  --  57* 57* 55* 65*  CREATININE 6.85* 6.99* 6.87* 6.81* 7.13* 7.25*  CALCIUM 8.3*  --  8.1* 8.0* 8.1* 8.4*  MG 1.3*  --  2.1 1.8 1.8 1.9  PHOS  --   --   --   --  4.8*  --    GFR: Estimated Creatinine Clearance: 11.1 mL/min (A) (by C-G formula based on SCr of 7.25 mg/dL (H)). Liver Function Tests: Recent Labs  Lab 10/05/22 1057  AST 30  ALT 17  ALKPHOS 114  BILITOT 0.7  PROT 7.1  ALBUMIN 3.4*   Recent Labs  Lab 10/05/22 1057  LIPASE 40   No results for input(s): "AMMONIA" in the last 168 hours. Coagulation Profile: No results for input(s): "INR", "PROTIME" in the last 168 hours. Cardiac Enzymes: No results for input(s): "CKTOTAL", "CKMB", "CKMBINDEX", "TROPONINI" in the last 168 hours. BNP (last 3 results) Recent Labs    04/01/22 1147  PROBNP 306*   HbA1C: No results for input(s): "HGBA1C" in the last 72 hours. CBG: Recent Labs  Lab 10/09/22 0803 10/09/22 1138 10/09/22 1712 10/09/22 2129 10/10/22 0827  GLUCAP 120* 127* 167* 162* 155*   Lipid Profile: No results for input(s): "CHOL", "HDL", "LDLCALC", "TRIG", "CHOLHDL", "LDLDIRECT" in the last 72 hours. Thyroid Function Tests: No results for input(s): "TSH", "T4TOTAL", "FREET4", "T3FREE", "THYROIDAB" in the last 72 hours. Anemia Panel: Recent Labs    10/08/22 1303  FERRITIN 145  TIBC 224*  IRON 51   Sepsis Labs: No results for input(s): "PROCALCITON", "LATICACIDVEN" in the last 168 hours.  Recent Results (from the past 240 hour(s))  Resp  Panel by RT-PCR (Flu A&B, Covid) Anterior Nasal Swab     Status: None   Collection Time: 10/05/22  5:11 PM   Specimen: Anterior Nasal Swab  Result Value Ref Range Status   SARS Coronavirus 2 by RT PCR NEGATIVE NEGATIVE Final    Comment: (NOTE) SARS-CoV-2 target nucleic acids are NOT DETECTED.  The SARS-CoV-2 RNA is generally detectable in upper respiratory specimens during the acute phase of infection. The lowest concentration of SARS-CoV-2 viral copies this assay can detect is 138 copies/mL. A negative result does not preclude SARS-Cov-2 infection and should not be used as the sole basis for treatment or other patient management decisions. A negative result may occur with  improper specimen collection/handling, submission of specimen other than nasopharyngeal swab, presence of viral mutation(s) within the areas targeted by this assay,  and inadequate number of viral copies(<138 copies/mL). A negative result must be combined with clinical observations, patient history, and epidemiological information. The expected result is Negative.  Fact Sheet for Patients:  EntrepreneurPulse.com.au  Fact Sheet for Healthcare Providers:  IncredibleEmployment.be  This test is no t yet approved or cleared by the [place] FDA and  has been authorized for detection and/or diagnosis of SARS-CoV-2 by FDA under an Emergency Use Authorization (EUA). This EUA will remain  in effect (meaning this test can be used) for the duration of the COVID-19 declaration under Section 564(b)(1) of the Act, 21 U.S.C.section 360bbb-3(b)(1), unless the authorization is terminated  or revoked sooner.       Influenza A by PCR NEGATIVE NEGATIVE Final   Influenza B by PCR NEGATIVE NEGATIVE Final    Comment: (NOTE) The Xpert Xpress SARS-CoV-2/FLU/RSV plus assay is intended as an aid in the diagnosis of influenza from Nasopharyngeal swab specimens and should not be used as a sole  basis for treatment. Nasal washings and aspirates are unacceptable for Xpert Xpress SARS-CoV-2/FLU/RSV testing.  Fact Sheet for Patients: EntrepreneurPulse.com.au  Fact Sheet for Healthcare Providers: IncredibleEmployment.be  This test is not yet approved or cleared by the [place] FDA and has been authorized for detection and/or diagnosis of SARS-CoV-2 by FDA under an Emergency Use Authorization (EUA). This EUA will remain in effect (meaning this test can be used) for the duration of the COVID-19 declaration under Section 564(b)(1) of the Act, 21 U.S.C. section 360bbb-3(b)(1), unless the authorization is terminated or revoked.  Performed at [facility], [place] [address], [place], [place] [address]          Radiology Studies: No results found.      Scheduled Meds:  amLODipine  10 mg Oral Daily   atorvastatin  20 mg Oral Daily   carvedilol  25 mg Oral BID WC   cloNIDine  0.2 mg Oral TID   famotidine  20 mg Oral QHS   heparin  5,000 Units Subcutaneous Q8H   hydrALAZINE  100 mg Oral TID   insulin aspart  0-5 Units Subcutaneous QHS   insulin aspart  0-9 Units Subcutaneous TID WC   insulin glargine-yfgn  16 Units Subcutaneous BID   pantoprazole  40 mg Oral BID AC   traZODone  50 mg Oral QHS   Continuous Infusions:     LOS: 5 days   Time spent= 35 mins    Maureen Jones Arsenio Loader, MD [facility]  If 7PM-7AM, please contact night-coverage  10/10/2022, 8:36 AM

## 2022-10-10 NOTE — Telephone Encounter (Signed)
-----   Message from Gatha Mayer, MD sent at 10/08/2022  2:22 PM EDT ----- Regarding: needs f/u Remo Lipps,  This lady needs a post-hospital f/u for GERD - 1-2 mos time frame is ok me vs APP Nevin Bloodgood knows her)

## 2022-10-11 DIAGNOSIS — E1159 Type 2 diabetes mellitus with other circulatory complications: Secondary | ICD-10-CM

## 2022-10-11 DIAGNOSIS — E785 Hyperlipidemia, unspecified: Secondary | ICD-10-CM

## 2022-10-11 DIAGNOSIS — N179 Acute kidney failure, unspecified: Secondary | ICD-10-CM | POA: Diagnosis not present

## 2022-10-11 DIAGNOSIS — I10 Essential (primary) hypertension: Secondary | ICD-10-CM

## 2022-10-11 DIAGNOSIS — Z794 Long term (current) use of insulin: Secondary | ICD-10-CM | POA: Diagnosis not present

## 2022-10-11 LAB — PTH, INTACT AND CALCIUM
Calcium, Total (PTH): 7.8 mg/dL — ABNORMAL LOW (ref 8.7–10.2)
PTH: 385 pg/mL — ABNORMAL HIGH (ref 15–65)

## 2022-10-11 LAB — CBC
HCT: 22.3 % — ABNORMAL LOW (ref 36.0–46.0)
Hemoglobin: 7 g/dL — ABNORMAL LOW (ref 12.0–15.0)
MCH: 28.7 pg (ref 26.0–34.0)
MCHC: 31.4 g/dL (ref 30.0–36.0)
MCV: 91.4 fL (ref 80.0–100.0)
Platelets: 168 10*3/uL (ref 150–400)
RBC: 2.44 MIL/uL — ABNORMAL LOW (ref 3.87–5.11)
RDW: 13.2 % (ref 11.5–15.5)
WBC: 9.7 10*3/uL (ref 4.0–10.5)
nRBC: 0 % (ref 0.0–0.2)

## 2022-10-11 LAB — BASIC METABOLIC PANEL WITH GFR
Anion gap: 8 (ref 5–15)
BUN: 70 mg/dL — ABNORMAL HIGH (ref 6–20)
CO2: 18 mmol/L — ABNORMAL LOW (ref 22–32)
Calcium: 8.6 mg/dL — ABNORMAL LOW (ref 8.9–10.3)
Chloride: 111 mmol/L (ref 98–111)
Creatinine, Ser: 7.5 mg/dL — ABNORMAL HIGH (ref 0.44–1.00)
GFR, Estimated: 6 mL/min — ABNORMAL LOW
Glucose, Bld: 187 mg/dL — ABNORMAL HIGH (ref 70–99)
Potassium: 5.1 mmol/L (ref 3.5–5.1)
Sodium: 137 mmol/L (ref 135–145)

## 2022-10-11 LAB — MAGNESIUM: Magnesium: 1.8 mg/dL (ref 1.7–2.4)

## 2022-10-11 LAB — GLUCOSE, CAPILLARY
Glucose-Capillary: 133 mg/dL — ABNORMAL HIGH (ref 70–99)
Glucose-Capillary: 164 mg/dL — ABNORMAL HIGH (ref 70–99)
Glucose-Capillary: 197 mg/dL — ABNORMAL HIGH (ref 70–99)
Glucose-Capillary: 225 mg/dL — ABNORMAL HIGH (ref 70–99)

## 2022-10-11 LAB — PREPARE RBC (CROSSMATCH)

## 2022-10-11 LAB — ABO/RH: ABO/RH(D): O POS

## 2022-10-11 MED ORDER — SODIUM CHLORIDE 0.9% IV SOLUTION: Freq: Once | INTRAVENOUS | Status: AC

## 2022-10-11 MED ORDER — DULOXETINE HCL 30 MG PO CPEP
30.0000 mg | ORAL_CAPSULE | Freq: Every day | ORAL | Status: DC
  Administered 2022-10-11 – 2022-10-14 (×4): 30 mg via ORAL
  Filled 2022-10-11 (×4): qty 1

## 2022-10-11 MED ORDER — INSULIN GLARGINE-YFGN 100 UNIT/ML ~~LOC~~ SOLN
18.0000 [IU] | Freq: Two times a day (BID) | SUBCUTANEOUS | Status: DC
  Administered 2022-10-11 – 2022-10-14 (×7): 18 [IU] via SUBCUTANEOUS
  Filled 2022-10-11 (×8): qty 0.18

## 2022-10-11 MED ORDER — CLONIDINE HCL 0.2 MG PO TABS
0.3000 mg | ORAL_TABLET | Freq: Three times a day (TID) | ORAL | Status: DC
  Administered 2022-10-11 – 2022-10-14 (×11): 0.3 mg via ORAL
  Filled 2022-10-11 (×11): qty 1

## 2022-10-11 NOTE — Progress Notes (Signed)
[facility] Progress Note   Subjective:   Feeling well, no issues.  Cr continues to rise but no uremic symptoms  Objective Vitals:   10/11/22 0514 10/11/22 0900 10/11/22 0923 10/11/22 1200  BP: (!) 158/71 (!) 158/71    Pulse: 75 75 76   Resp: 17 17 14    Temp: 97.9 F (36.6 C) 98.5 F (36.9 C)    TempSrc: Oral Oral    SpO2: 100% 100%  100%  Weight:       Physical Exam General: NAD Heart:RRR no rub Lungs: clear, + trach Abdomen: soft, obese Extremities: no edema Neuro:  nonfocal   Additional Objective Labs: Basic Metabolic Panel: Recent Labs  Lab 10/09/22 0214 10/10/22 0235 10/11/22 0311  NA 132* 136 137  K 4.4 4.9 5.1  CL 105 109 111  CO2 17* 18* 18*  GLUCOSE 175* 155* 187*  BUN 55* 65* 70*  CREATININE 7.13* 7.25* 7.50*  CALCIUM 8.1*  7.8* 8.4* 8.6*  PHOS 4.8*  --   --    Liver Function Tests: Recent Labs  Lab 10/05/22 1057  AST 30  ALT 17  ALKPHOS 114  BILITOT 0.7  PROT 7.1  ALBUMIN 3.4*   Recent Labs  Lab 10/05/22 1057  LIPASE 40   CBC: Recent Labs  Lab 10/05/22 1057 10/06/22 0330 10/07/22 0351 10/08/22 0200 10/09/22 0214 10/10/22 0235 10/11/22 0311  WBC 13.1*   < > 12.3* 11.2* 10.2 9.2 9.7  NEUTROABS 11.4*  --   --   --   --   --   --   HGB 9.3*   < > 7.4* 7.4* 7.1* 7.2* 7.0*  HCT 29.7*   < > 22.6* 23.4* 22.4* 22.2* 22.3*  MCV 91.7   < > 89.0 89.7 90.7 91.0 91.4  PLT 240   < > 196 146* 160 169 168   < > = values in this interval not displayed.   Blood Culture    Component Value Date/Time   SDES URINE, CLEAN CATCH 02/13/2022 2256   SPECREQUEST NONE 02/13/2022 2256   CULT (A) 02/13/2022 2256    80,000 COLONIES/mL AEROCOCCUS SPECIES Standardized susceptibility testing for this organism is not available. Performed at [facility], [place] [address] [place], [place] [address]    REPTSTATUS 02/15/2022 FINAL 02/13/2022 2256    Cardiac Enzymes: No results for input(s): "CKTOTAL", "CKMB", "CKMBINDEX",  "TROPONINI" in the last 168 hours. CBG: Recent Labs  Lab 10/10/22 1216 10/10/22 1641 10/10/22 2118 10/11/22 0858 10/11/22 1148  GLUCAP 159* 142* 213* 133* 164*   Iron Studies:  No results for input(s): "IRON", "TIBC", "TRANSFERRIN", "FERRITIN" in the last 72 hours.  @lablastinr3 @ Studies/Results: No results found. Medications:    amLODipine  10 mg Oral Daily   atorvastatin  20 mg Oral Daily   carvedilol  25 mg Oral BID WC   cloNIDine  0.3 mg Oral TID   DULoxetine  30 mg Oral Daily   famotidine  20 mg Oral QHS   heparin  5,000 Units Subcutaneous Q8H   hydrALAZINE  100 mg Oral TID   insulin aspart  0-5 Units Subcutaneous QHS   insulin aspart  0-9 Units Subcutaneous TID WC   insulin glargine-yfgn  18 Units Subcutaneous BID   pantoprazole  40 mg Oral BID AC   traZODone  50 mg Oral QHS    Assessment/Recommendations: Maureen Jones is a/an 56 y.o. female with a past medical history of CKD V, diabetes mellitis type 2 insulin dependent, chronic  tracheostomy on 3 L, vocal cord dysfunction/subglottic stenosis, and HTN who presents to [facility] with nausea, vomiting and diarrhea. She was admitted for AKI.    Non-Oliguric AKI on CKD V:  suspect prerenal AKI progressed to ATN in the setting of V/D/poor po intake on background of advanced CKD.   No evidence of obstruction on CT abdomen/pelvis 10/25.  Suspect she has CKD secondary to poorly controlled HTN and DM. She had an outpt nephrology consult in 2021 at [facility] - noted to have nephrotic range proteinuria, neg serologic w/u and never followed up.  I do not think a renal biopsy would change current management.  -cont po intake, no need for IVF -Cont holding losartan and aldactone  - firmly no HD--> discussed natural history of renal disease - I also discussed that she has advanced CKD and most likely--> if HD needed--> will not recover enough renal function to come off HD completely - reiterates to me again that no HD is desired -  palliative care c/d- appreciate assistance  Normocytic anemia: Hb in 7s, no bleeding -iron sat 23%, will give 1 dose IV iron and start ESA -Transfuse Hgb <7.0- getting I u pRBCs today     Insulin-dependent diabetes mellitus type 2 Per primary team- Semglee 16 units twice daily. 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 Has recently undergone work-up for concerns of vocal cord dysfunction/subglottic stenosis.    Hypertension    Dispo: pending    Madelon Lips MD [facility] Pager [phone]

## 2022-10-11 NOTE — Plan of Care (Signed)
  Problem: Education: Goal: Ability to describe self-care measures that may prevent or decrease complications (Diabetes Survival Skills Education) will improve Outcome: Progressing   Problem: Coping: Goal: Ability to adjust to condition or change in health will improve Outcome: Progressing   Problem: Skin Integrity: Goal: Risk for impaired skin integrity will decrease Outcome: Progressing   Problem: Education: Goal: Knowledge of General Education information will improve Description: Including pain rating scale, medication(s)/side effects and non-pharmacologic comfort measures Outcome: Progressing   Problem: Activity: Goal: Risk for activity intolerance will decrease Outcome: Progressing   Problem: Coping: Goal: Level of anxiety will decrease Outcome: Progressing   Problem: Pain Managment: Goal: General experience of comfort will improve Outcome: Progressing   Problem: Safety: Goal: Ability to remain free from injury will improve Outcome: Progressing   Problem: Skin Integrity: Goal: Risk for impaired skin integrity will decrease Outcome: Progressing   

## 2022-10-11 NOTE — Progress Notes (Signed)
PROGRESS NOTE    Lauramae Kneisley  TIW:[id number] DOB: Dec 23, 1965 DOA: 10/05/2022 PCP: Flossie Buffy, NP   Brief Narrative:  56 y.o. 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 She was admitted for acute kidney injury, slowly improving with IV fluids.  Creatinine started plateauing therefore nephrology consulted.  Also has persistent heartburn despite of Pepcid and PPI, GI consulted.  GI recommended treating with PPI twice daily and outpatient follow-up for endoscopy.  Due to rising creatinine and patient is very hesitant to start HD palliative care team consulted to help establish goals of care   Assessment & Plan:  Principal Problem:   AKI (acute kidney injury) ([place]) Active Problems:   Vocal cord dysfunction   Tracheostomy dependence ([place])   Resistant hypertension   OSA (obstructive sleep apnea)   DM (diabetes mellitus) ([place])   CKD (chronic kidney disease) stage 5, GFR less than 15 ml/min (HCC)   Nausea vomiting and diarrhea   Morbid (severe) obesity due to excess calories (HCC)    Acute kidney injury on CKD stage V -Baseline creatinine 4.0, admission creatinine 7.56.  Suspect from nausea vomiting, prerenal.  Holding losartan and Aldactone.  Nephro following, creatinine rising, creatinine 7.5.  Although no uremic symptoms patient is hesitant to start hemodialysis.  We will consult palliative care to help establish goals of care.   Nausea and vomiting with heartburn type symptoms, improved - Unclear etiology.  LFTs, lipase are normal.  CT abdomen pelvis without contrast is unremarkable.  Seen by GI, recommend outpatient endoscopy.  Continue PPI twice daily upon discharge  Anemia of chronic disease - Hemoglobin down to 7.0.  No obvious evidence of bleeding, agreeable for 1  unit PRBC   Insulin-dependent diabetes mellitus type 2, uncontrolled due to hyperglycemia - While in acute kidney injury, Lower dose of insulin.   Increase Semglee to 18 units twice daily.  Sliding scale and Accu-Cheks   Tracheostomy dependence - Patient follows outpatient pulmonary.  Recently has undergone work-up for concerns of vocal cord dysfunction/subglottic stenosis.  ENT and pulmonary recommending periodic trach change, last trach change 2 days ago by outpatient pulmonary.  We will closely monitor here, if necessary we can get pulmonary involved   Essential hypertension, uncontrolled - Continue Norvasc and hydralazine.  We will increase clonidine 0.3 mg 3 times daily. -IV as needed   GERD - PPI   Hesitant to start HD, has Trach in place. Worsening Cr. Palliative care consulted to help establish GOC.  Patient is open to discussion with palliative  DVT prophylaxis: SQ. heparin Code Status: Full code Family Communication:    Maintain hospital stay until renal function improves Will await nephrology clearance   Subjective: Seen and examined at bedside, no complaints this morning.  She is agreeable to 1 unit PRBC transfusion and discussing her care with palliative care team.  She is really against being on dialysis 3 times a week. Overall she is very pleasant and appreciative of her care  Examination: Constitutional: Not in acute distress, very pleasant Respiratory: Clear to auscultation bilaterally Cardiovascular: Normal sinus rhythm, no rubs Abdomen: Nontender nondistended good bowel sounds Musculoskeletal: No edema noted Skin: No rashes seen Neurologic: CN 2-12 grossly intact.  And nonfocal Psychiatric: Normal judgment and insight. Alert and oriented x 3. Normal mood.  Tracheostomy in place  Objective: Vitals:  10/10/22 2116 10/11/22 0318 10/11/22 0500 10/11/22 0514  BP: 138/80   (!) 158/71  Pulse: 71 69  75  Resp: 18 18  17   Temp: 98.6 F (37 C)   97.9 F (36.6  C)  TempSrc: Oral   Oral  SpO2: 100% 100%  100%  Weight:   111.6 kg     Intake/Output Summary (Last 24 hours) at 10/11/2022 0854 Last data filed at 10/10/2022 1235 Gross per 24 hour  Intake --  Output 100 ml  Net -100 ml   Filed Weights   10/09/22 0500 10/10/22 0415 10/11/22 0500  Weight: 108.7 kg 111.9 kg 111.6 kg     Data Reviewed:   CBC: Recent Labs  Lab 10/05/22 1057 10/06/22 0330 10/07/22 0351 10/08/22 0200 10/09/22 0214 10/10/22 0235 10/11/22 0311  WBC 13.1*   < > 12.3* 11.2* 10.2 9.2 9.7  NEUTROABS 11.4*  --   --   --   --   --   --   HGB 9.3*   < > 7.4* 7.4* 7.1* 7.2* 7.0*  HCT 29.7*   < > 22.6* 23.4* 22.4* 22.2* 22.3*  MCV 91.7   < > 89.0 89.7 90.7 91.0 91.4  PLT 240   < > 196 146* 160 169 168   < > = values in this interval not displayed.   Basic Metabolic Panel: Recent Labs  Lab 10/07/22 0351 10/08/22 0200 10/09/22 0214 10/10/22 0235 10/11/22 0311  NA 136 135 132* 136 137  K 4.3 4.6 4.4 4.9 5.1  CL 109 108 105 109 111  CO2 19* 17* 17* 18* 18*  GLUCOSE 110* 97 175* 155* 187*  BUN 57* 57* 55* 65* 70*  CREATININE 6.87* 6.81* 7.13* 7.25* 7.50*  CALCIUM 8.1* 8.0* 8.1*  7.8* 8.4* 8.6*  MG 2.1 1.8 1.8 1.9 1.8  PHOS  --   --  4.8*  --   --    GFR: Estimated Creatinine Clearance: 10.7 mL/min (A) (by C-G formula based on SCr of 7.5 mg/dL (H)). Liver Function Tests: Recent Labs  Lab 10/05/22 1057  AST 30  ALT 17  ALKPHOS 114  BILITOT 0.7  PROT 7.1  ALBUMIN 3.4*   Recent Labs  Lab 10/05/22 1057  LIPASE 40   No results for input(s): "AMMONIA" in the last 168 hours. Coagulation Profile: No results for input(s): "INR", "PROTIME" in the last 168 hours. Cardiac Enzymes: No results for input(s): "CKTOTAL", "CKMB", "CKMBINDEX", "TROPONINI" in the last 168 hours. BNP (last 3 results) Recent Labs    04/01/22 1147  PROBNP 306*   HbA1C: No results for input(s): "HGBA1C" in the last 72 hours. CBG: Recent Labs  Lab 10/09/22 2129  10/10/22 0827 10/10/22 1216 10/10/22 1641 10/10/22 2118  GLUCAP 162* 155* 159* 142* 213*   Lipid Profile: No results for input(s): "CHOL", "HDL", "LDLCALC", "TRIG", "CHOLHDL", "LDLDIRECT" in the last 72 hours. Thyroid Function Tests: No results for input(s): "TSH", "T4TOTAL", "FREET4", "T3FREE", "THYROIDAB" in the last 72 hours. Anemia Panel: Recent Labs    10/08/22 1303  FERRITIN 145  TIBC 224*  IRON 51   Sepsis Labs: No results for input(s): "PROCALCITON", "LATICACIDVEN" in the last 168 hours.  Recent Results (from the past 240 hour(s))  Resp Panel by RT-PCR (Flu A&B, Covid) Anterior Nasal Swab     Status: None   Collection Time: 10/05/22  5:11 PM   Specimen: Anterior Nasal Swab  Result Value Ref Range Status   SARS Coronavirus 2 by RT PCR NEGATIVE NEGATIVE  Final    Comment: (NOTE) SARS-CoV-2 target nucleic acids are NOT DETECTED.  The SARS-CoV-2 RNA is generally detectable in upper respiratory specimens during the acute phase of infection. The lowest concentration of SARS-CoV-2 viral copies this assay can detect is 138 copies/mL. A negative result does not preclude SARS-Cov-2 infection and should not be used as the sole basis for treatment or other patient management decisions. A negative result may occur with  improper specimen collection/handling, submission of specimen other than nasopharyngeal swab, presence of viral mutation(s) within the areas targeted by this assay, and inadequate number of viral copies(<138 copies/mL). A negative result must be combined with clinical observations, patient history, and epidemiological information. The expected result is Negative.  Fact Sheet for Patients:  EntrepreneurPulse.com.au  Fact Sheet for Healthcare Providers:  IncredibleEmployment.be  This test is no t yet approved or cleared by the [place] FDA and  has been authorized for detection and/or diagnosis of SARS-CoV-2 by FDA  under an Emergency Use Authorization (EUA). This EUA will remain  in effect (meaning this test can be used) for the duration of the COVID-19 declaration under Section 564(b)(1) of the Act, 21 U.S.C.section 360bbb-3(b)(1), unless the authorization is terminated  or revoked sooner.       Influenza A by PCR NEGATIVE NEGATIVE Final   Influenza B by PCR NEGATIVE NEGATIVE Final    Comment: (NOTE) The Xpert Xpress SARS-CoV-2/FLU/RSV plus assay is intended as an aid in the diagnosis of influenza from Nasopharyngeal swab specimens and should not be used as a sole basis for treatment. Nasal washings and aspirates are unacceptable for Xpert Xpress SARS-CoV-2/FLU/RSV testing.  Fact Sheet for Patients: EntrepreneurPulse.com.au  Fact Sheet for Healthcare Providers: IncredibleEmployment.be  This test is not yet approved or cleared by the [place] FDA and has been authorized for detection and/or diagnosis of SARS-CoV-2 by FDA under an Emergency Use Authorization (EUA). This EUA will remain in effect (meaning this test can be used) for the duration of the COVID-19 declaration under Section 564(b)(1) of the Act, 21 U.S.C. section 360bbb-3(b)(1), unless the authorization is terminated or revoked.  Performed at [facility], [place] [address], [place], [place] [address]          Radiology Studies: No results found.      Scheduled Meds:  amLODipine  10 mg Oral Daily   atorvastatin  20 mg Oral Daily   carvedilol  25 mg Oral BID WC   cloNIDine  0.2 mg Oral TID   famotidine  20 mg Oral QHS   heparin  5,000 Units Subcutaneous Q8H   hydrALAZINE  100 mg Oral TID   insulin aspart  0-5 Units Subcutaneous QHS   insulin aspart  0-9 Units Subcutaneous TID WC   insulin glargine-yfgn  16 Units Subcutaneous BID   pantoprazole  40 mg Oral BID AC   traZODone  50 mg Oral QHS   Continuous Infusions:     LOS: 6 days   Time spent= 35  mins    Welton Bord Arsenio Loader, MD [facility]  If 7PM-7AM, please contact night-coverage  10/11/2022, 8:54 AM

## 2022-10-12 DIAGNOSIS — N179 Acute kidney failure, unspecified: Secondary | ICD-10-CM | POA: Diagnosis not present

## 2022-10-12 LAB — CBC
HCT: 25.2 % — ABNORMAL LOW (ref 36.0–46.0)
Hemoglobin: 8 g/dL — ABNORMAL LOW (ref 12.0–15.0)
MCH: 29.1 pg (ref 26.0–34.0)
MCHC: 31.7 g/dL (ref 30.0–36.0)
MCV: 91.6 fL (ref 80.0–100.0)
Platelets: 183 10*3/uL (ref 150–400)
RBC: 2.75 MIL/uL — ABNORMAL LOW (ref 3.87–5.11)
RDW: 13.2 % (ref 11.5–15.5)
WBC: 10.7 10*3/uL — ABNORMAL HIGH (ref 4.0–10.5)
nRBC: 0 % (ref 0.0–0.2)

## 2022-10-12 LAB — BASIC METABOLIC PANEL
Anion gap: 7 (ref 5–15)
BUN: 72 mg/dL — ABNORMAL HIGH (ref 6–20)
CO2: 20 mmol/L — ABNORMAL LOW (ref 22–32)
Calcium: 8.9 mg/dL (ref 8.9–10.3)
Chloride: 110 mmol/L (ref 98–111)
Creatinine, Ser: 7.52 mg/dL — ABNORMAL HIGH (ref 0.44–1.00)
GFR, Estimated: 6 mL/min — ABNORMAL LOW (ref >60)
Glucose, Bld: 154 mg/dL — ABNORMAL HIGH (ref 70–99)
Potassium: 5.2 mmol/L — ABNORMAL HIGH (ref 3.5–5.1)
Sodium: 137 mmol/L (ref 135–145)

## 2022-10-12 LAB — GLUCOSE, CAPILLARY
Glucose-Capillary: 161 mg/dL — ABNORMAL HIGH (ref 70–99)
Glucose-Capillary: 202 mg/dL — ABNORMAL HIGH (ref 70–99)
Glucose-Capillary: 115 mg/dL — ABNORMAL HIGH (ref 70–99)
Glucose-Capillary: 143 mg/dL — ABNORMAL HIGH (ref 70–99)

## 2022-10-12 LAB — TYPE AND SCREEN
ABO/RH(D): O POS
Antibody Screen: NEGATIVE
Unit division: 0

## 2022-10-12 LAB — BPAM RBC
Blood Product Expiration Date: 202311302359
ISSUE DATE / TIME: 202310311628
Unit Type and Rh: 5100

## 2022-10-12 LAB — MAGNESIUM: Magnesium: 1.8 mg/dL (ref 1.7–2.4)

## 2022-10-12 MED ORDER — AZELASTINE HCL 0.1 % NA SOLN
2.0000 | Freq: Two times a day (BID) | NASAL | Status: DC
  Administered 2022-10-12 – 2022-10-14 (×5): 2 via NASAL
  Filled 2022-10-12: qty 30

## 2022-10-12 MED ORDER — ACETAMINOPHEN 325 MG PO TABS
650.0000 mg | ORAL_TABLET | Freq: Once | ORAL | Status: AC
  Administered 2022-10-12: 650 mg via ORAL
  Filled 2022-10-12: qty 2

## 2022-10-12 MED ORDER — LORATADINE 10 MG PO TABS
10.0000 mg | ORAL_TABLET | Freq: Every evening | ORAL | Status: DC
  Administered 2022-10-12 – 2022-10-13 (×2): 10 mg via ORAL
  Filled 2022-10-12 (×2): qty 1

## 2022-10-12 NOTE — Plan of Care (Signed)
  Problem: Coping: Goal: Ability to adjust to condition or change in health will improve Outcome: Progressing   Problem: Nutritional: Goal: Maintenance of adequate nutrition will improve Outcome: Progressing   Problem: Skin Integrity: Goal: Risk for impaired skin integrity will decrease Outcome: Progressing   

## 2022-10-12 NOTE — Progress Notes (Signed)
[facility] Progress Note   Subjective:   seen in room.  Mom and sister at bedside.  Long discussion today- pt is considering dialysis now.  Objective Vitals:   10/12/22 0416 10/12/22 0431 10/12/22 0811 10/12/22 1000  BP: (!) 164/65 (!) 160/65 (!) 165/67   Pulse: 75 74 74 73  Resp: 20 18 18 16   Temp: 98.5 F (36.9 C) 98.1 F (36.7 C) 98.3 F (36.8 C)   TempSrc: Oral Oral Oral   SpO2: 100% 99% 99% 99%  Weight:       Physical Exam General: NAD Heart:RRR no rub Lungs: clear, + trach Abdomen: soft, obese Extremities: no edema Neuro:  nonfocal   Additional Objective Labs: Basic Metabolic Panel: Recent Labs  Lab 10/09/22 0214 10/10/22 0235 10/11/22 0311 10/12/22 0401  NA 132* 136 137 137  K 4.4 4.9 5.1 5.2*  CL 105 109 111 110  CO2 17* 18* 18* 20*  GLUCOSE 175* 155* 187* 154*  BUN 55* 65* 70* 72*  CREATININE 7.13* 7.25* 7.50* 7.52*  CALCIUM 8.1*  7.8* 8.4* 8.6* 8.9  PHOS 4.8*  --   --   --    Liver Function Tests: No results for input(s): "AST", "ALT", "ALKPHOS", "BILITOT", "PROT", "ALBUMIN" in the last 168 hours.  No results for input(s): "LIPASE", "AMYLASE" in the last 168 hours.  CBC: Recent Labs  Lab 10/08/22 0200 10/09/22 0214 10/10/22 0235 10/11/22 0311 10/12/22 0401  WBC 11.2* 10.2 9.2 9.7 10.7*  HGB 7.4* 7.1* 7.2* 7.0* 8.0*  HCT 23.4* 22.4* 22.2* 22.3* 25.2*  MCV 89.7 90.7 91.0 91.4 91.6  PLT 146* 160 169 168 183   Blood Culture    Component Value Date/Time   SDES URINE, CLEAN CATCH 02/13/2022 2256   SPECREQUEST NONE 02/13/2022 2256   CULT (A) 02/13/2022 2256    80,000 COLONIES/mL AEROCOCCUS SPECIES Standardized susceptibility testing for this organism is not available. Performed at [facility], [place] [address], [place], [place] [address]    REPTSTATUS 02/15/2022 FINAL 02/13/2022 2256    Cardiac Enzymes: No results for input(s): "CKTOTAL", "CKMB", "CKMBINDEX", "TROPONINI" in the last 168 hours. CBG: Recent Labs   Lab 10/11/22 1148 10/11/22 1637 10/11/22 2108 10/12/22 0809 10/12/22 1150  GLUCAP 164* 197* 225* 161* 202*   Iron Studies:  No results for input(s): "IRON", "TIBC", "TRANSFERRIN", "FERRITIN" in the last 72 hours.  @lablastinr3 @ Studies/Results: No results found. Medications:    amLODipine  10 mg Oral Daily   atorvastatin  20 mg Oral Daily   azelastine  2 spray Each Nare BID   carvedilol  25 mg Oral BID WC   cloNIDine  0.3 mg Oral TID   DULoxetine  30 mg Oral Daily   famotidine  20 mg Oral QHS   heparin  5,000 Units Subcutaneous Q8H   hydrALAZINE  100 mg Oral TID   insulin aspart  0-5 Units Subcutaneous QHS   insulin aspart  0-9 Units Subcutaneous TID WC   insulin glargine-yfgn  18 Units Subcutaneous BID   loratadine  10 mg Oral QPM   pantoprazole  40 mg Oral BID AC   traZODone  50 mg Oral QHS    Assessment/Recommendations: Maureen Jones is a/an 56 y.o. female with a past medical history of CKD V, diabetes mellitis type 2 insulin dependent, chronic tracheostomy on 3 L, vocal cord dysfunction/subglottic stenosis, and HTN who presents to [facility] with nausea, vomiting and diarrhea. She was admitted for AKI.    Non-Oliguric AKI on  CKD V:  suspect prerenal AKI progressed to ATN in the setting of V/D/poor po intake on background of advanced CKD.   No evidence of obstruction on CT abdomen/pelvis 10/25.  Suspect she has CKD secondary to poorly controlled HTN and DM. She had an outpt nephrology consult in 2021 at [facility] - noted to have nephrotic range proteinuria, neg serologic w/u and never followed up.  I do not think a renal biopsy would change current management.  - not uremic so no RRT indicated -cont po intake, no need for IVF -Cont holding losartan and aldactone  - initially firmly no dialysis- now is reconsidering- not uremic so we will continue to dialogue.   - I also discussed that she has advanced CKD and most likely--> if HD needed--> will not recover enough renal  function to come off HD completely - palliative care c/d- appreciate assistance  Normocytic anemia: Hb in 7s, no bleeding -iron sat 23%, will give 1 dose IV iron and start ESA -Transfuse Hgb <7.0- getting I u pRBCs today     Insulin-dependent diabetes mellitus type 2 Per primary team- Semglee 16 units twice daily. 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 Has recently undergone work-up for concerns of vocal cord dysfunction/subglottic stenosis.    Hypertension    Dispo: pending    Madelon Lips MD [facility] Pager [phone]

## 2022-10-12 NOTE — Progress Notes (Signed)
PROGRESS NOTE    Maureen Jones  IWP:[id number] DOB: 09/26/1966 DOA: 10/05/2022 PCP: Flossie Buffy, NP   Brief Narrative:  56 y.o. 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 She was admitted for acute kidney injury, slowly improving with IV fluids.  Creatinine started plateauing therefore nephrology consulted.  Also has persistent heartburn despite of Pepcid and PPI, GI consulted.  GI recommended treating with PPI twice daily and outpatient follow-up for endoscopy.  Due to rising creatinine and patient is very hesitant to start HD palliative care team consulted to help establish goals of care.   Assessment & Plan:  Principal Problem:   AKI (acute kidney injury) ([place]) Active Problems:   Vocal cord dysfunction   Tracheostomy dependence (HCC)   Resistant hypertension   OSA (obstructive sleep apnea)   DM (diabetes mellitus) (HCC)   CKD (chronic kidney disease) stage 5, GFR less than 15 ml/min (HCC)   Nausea vomiting and diarrhea   Morbid (severe) obesity due to excess calories (HCC)    Acute kidney injury on CKD stage V -Baseline creatinine 4.0, admission creatinine 7.56.  Suspect from nausea vomiting, prerenal.  Holding losartan and Aldactone.  Nephro following, creatinine rising, creatinine 7.5.  Although no uremic symptoms patient is hesitant to start hemodialysis.  We will consult palliative care to help establish goals of care.   Nausea and vomiting with heartburn type symptoms, improved - Unclear etiology.  LFTs, lipase are normal.  CT abdomen pelvis without contrast is unremarkable.  Seen by GI, recommend outpatient endoscopy.  Continue PPI twice daily upon discharge  Anemia of chronic disease - Hemoglobin down to 7.0.  No obvious evidence of bleeding, agreeable for 1  unit PRBC   Insulin-dependent diabetes mellitus type 2, uncontrolled due to hyperglycemia - While in acute kidney injury, Lower dose of insulin.   Increase Semglee to 18 units twice daily.  Sliding scale and Accu-Cheks   Tracheostomy dependence - Patient follows outpatient pulmonary.  Recently has undergone work-up for concerns of vocal cord dysfunction/subglottic stenosis.  ENT and pulmonary recommending periodic trach change, last trach change 2 days ago by outpatient pulmonary.  We will closely monitor here   Essential hypertension, uncontrolled - Continue Norvasc and hydralazine.  We will increase clonidine 0.3 mg 3 times daily. -IV as needed   GERD - PPI   Obesity Estimated body mass index is 39.71 kg/m as calculated from the following:   Height as of 07/22/22: 5\' 6"  (1.676 m).   Weight as of this encounter: 111.6 kg.    Hesitant to start HD, has Trach in place. Worsening Cr. Palliative care consulted to help establish GOC.  Patient knows if she needs HD and chooses not to take then she will die  DVT prophylaxis: SQ. heparin Code Status: Full code Family Communication:       Subjective: C/o sinus pressure  Examination:  General: Appearance:    Obese female in no acute distress     Lungs:     respirations unlabored  Heart:    Normal heart rate.   MS:   All extremities are intact.   Neurologic:   Awake, alert, oriented x 3. No apparent focal neurological           defect.    Objective: Vitals:   10/12/22 0416 10/12/22 0431 10/12/22 0811 10/12/22 1000  BP: (!) 164/65 (!) 160/65 (!) 165/67   Pulse: 75 74 74 73  Resp: 20 18 18 16   Temp: 98.5 F (36.9 C) 98.1 F (36.7 C) 98.3 F (36.8 C)   TempSrc: Oral Oral Oral   SpO2: 100% 99% 99% 99%  Weight:        Intake/Output Summary (Last 24 hours) at 10/12/2022 1318 Last data filed at 10/12/2022 0814 Gross per 24 hour  Intake 677.83 ml  Output --  Net 677.83 ml   Filed Weights   10/09/22 0500 10/10/22 0415  10/11/22 0500  Weight: 108.7 kg 111.9 kg 111.6 kg     Data Reviewed:   CBC: Recent Labs  Lab 10/08/22 0200 10/09/22 0214 10/10/22 0235 10/11/22 0311 10/12/22 0401  WBC 11.2* 10.2 9.2 9.7 10.7*  HGB 7.4* 7.1* 7.2* 7.0* 8.0*  HCT 23.4* 22.4* 22.2* 22.3* 25.2*  MCV 89.7 90.7 91.0 91.4 91.6  PLT 146* 160 169 168 854   Basic Metabolic Panel: Recent Labs  Lab 10/08/22 0200 10/09/22 0214 10/10/22 0235 10/11/22 0311 10/12/22 0401  NA 135 132* 136 137 137  K 4.6 4.4 4.9 5.1 5.2*  CL 108 105 109 111 110  CO2 17* 17* 18* 18* 20*  GLUCOSE 97 175* 155* 187* 154*  BUN 57* 55* 65* 70* 72*  CREATININE 6.81* 7.13* 7.25* 7.50* 7.52*  CALCIUM 8.0* 8.1*  7.8* 8.4* 8.6* 8.9  MG 1.8 1.8 1.9 1.8 1.8  PHOS  --  4.8*  --   --   --    GFR: Estimated Creatinine Clearance: 10.7 mL/min (A) (by C-G formula based on SCr of 7.52 mg/dL (H)). Liver Function Tests: No results for input(s): "AST", "ALT", "ALKPHOS", "BILITOT", "PROT", "ALBUMIN" in the last 168 hours.  No results for input(s): "LIPASE", "AMYLASE" in the last 168 hours.  No results for input(s): "AMMONIA" in the last 168 hours. Coagulation Profile: No results for input(s): "INR", "PROTIME" in the last 168 hours. Cardiac Enzymes: No results for input(s): "CKTOTAL", "CKMB", "CKMBINDEX", "TROPONINI" in the last 168 hours. BNP (last 3 results) Recent Labs    04/01/22 1147  PROBNP 306*   HbA1C: No results for input(s): "HGBA1C" in the last 72 hours. CBG: Recent Labs  Lab 10/11/22 1148 10/11/22 1637 10/11/22 2108 10/12/22 0809 10/12/22 1150  GLUCAP 164* 197* 225* 161* 202*   Lipid Profile: No results for input(s): "CHOL", "HDL", "LDLCALC", "TRIG", "CHOLHDL", "LDLDIRECT" in the last 72 hours. Thyroid Function Tests: No results for input(s): "TSH", "T4TOTAL", "FREET4", "T3FREE", "THYROIDAB" in the last 72 hours. Anemia Panel: No results for input(s): "VITAMINB12", "FOLATE", "FERRITIN", "TIBC", "IRON", "RETICCTPCT" in the  last 72 hours.  Sepsis Labs: No results for input(s): "PROCALCITON", "LATICACIDVEN" in the last 168 hours.  Recent Results (from the past 240 hour(s))  Resp Panel by RT-PCR (Flu A&B, Covid) Anterior Nasal Swab     Status: None   Collection Time: 10/05/22  5:11 PM   Specimen: Anterior Nasal Swab  Result Value Ref Range Status   SARS Coronavirus 2 by RT PCR NEGATIVE NEGATIVE Final    Comment: (NOTE) SARS-CoV-2 target nucleic acids are NOT DETECTED.  The SARS-CoV-2 RNA is generally detectable in upper respiratory specimens during the acute phase of infection. The lowest concentration of SARS-CoV-2 viral copies this assay can detect is 138 copies/mL. A negative result does not preclude SARS-Cov-2 infection and should not be used as the sole basis for treatment or other patient management decisions. A negative result may occur with  improper specimen  collection/handling, submission of specimen other than nasopharyngeal swab, presence of viral mutation(s) within the areas targeted by this assay, and inadequate number of viral copies(<138 copies/mL). A negative result must be combined with clinical observations, patient history, and epidemiological information. The expected result is Negative.  Fact Sheet for Patients:  EntrepreneurPulse.com.au  Fact Sheet for Healthcare Providers:  IncredibleEmployment.be  This test is no t yet approved or cleared by the [place] FDA and  has been authorized for detection and/or diagnosis of SARS-CoV-2 by FDA under an Emergency Use Authorization (EUA). This EUA will remain  in effect (meaning this test can be used) for the duration of the COVID-19 declaration under Section 564(b)(1) of the Act, 21 U.S.C.section 360bbb-3(b)(1), unless the authorization is terminated  or revoked sooner.       Influenza A by PCR NEGATIVE NEGATIVE Final   Influenza B by PCR NEGATIVE NEGATIVE Final    Comment: (NOTE) The  Xpert Xpress SARS-CoV-2/FLU/RSV plus assay is intended as an aid in the diagnosis of influenza from Nasopharyngeal swab specimens and should not be used as a sole basis for treatment. Nasal washings and aspirates are unacceptable for Xpert Xpress SARS-CoV-2/FLU/RSV testing.  Fact Sheet for Patients: EntrepreneurPulse.com.au  Fact Sheet for Healthcare Providers: IncredibleEmployment.be  This test is not yet approved or cleared by the [place] FDA and has been authorized for detection and/or diagnosis of SARS-CoV-2 by FDA under an Emergency Use Authorization (EUA). This EUA will remain in effect (meaning this test can be used) for the duration of the COVID-19 declaration under Section 564(b)(1) of the Act, 21 U.S.C. section 360bbb-3(b)(1), unless the authorization is terminated or revoked.  Performed at [facility], [place] [address] [place], [place] [address]          Radiology Studies: No results found.      Scheduled Meds:  amLODipine  10 mg Oral Daily   atorvastatin  20 mg Oral Daily   carvedilol  25 mg Oral BID WC   cloNIDine  0.3 mg Oral TID   DULoxetine  30 mg Oral Daily   famotidine  20 mg Oral QHS   heparin  5,000 Units Subcutaneous Q8H   hydrALAZINE  100 mg Oral TID   insulin aspart  0-5 Units Subcutaneous QHS   insulin aspart  0-9 Units Subcutaneous TID WC   insulin glargine-yfgn  18 Units Subcutaneous BID   pantoprazole  40 mg Oral BID AC   traZODone  50 mg Oral QHS   Continuous Infusions:     LOS: 7 days   Time spent= 35 mins    Geradine Girt, DO [facility]  If 7PM-7AM, please contact night-coverage  10/12/2022, 1:18 PM

## 2022-10-13 DIAGNOSIS — Z930 Tracheostomy status: Secondary | ICD-10-CM

## 2022-10-13 DIAGNOSIS — K529 Noninfective gastroenteritis and colitis, unspecified: Secondary | ICD-10-CM

## 2022-10-13 DIAGNOSIS — R112 Nausea with vomiting, unspecified: Secondary | ICD-10-CM

## 2022-10-13 DIAGNOSIS — Z7189 Other specified counseling: Secondary | ICD-10-CM | POA: Diagnosis not present

## 2022-10-13 DIAGNOSIS — R197 Diarrhea, unspecified: Secondary | ICD-10-CM

## 2022-10-13 DIAGNOSIS — N185 Chronic kidney disease, stage 5: Secondary | ICD-10-CM

## 2022-10-13 DIAGNOSIS — N179 Acute kidney failure, unspecified: Secondary | ICD-10-CM | POA: Diagnosis not present

## 2022-10-13 LAB — CBC
HCT: 27.1 % — ABNORMAL LOW (ref 36.0–46.0)
Hemoglobin: 8.4 g/dL — ABNORMAL LOW (ref 12.0–15.0)
MCH: 28.8 pg (ref 26.0–34.0)
MCHC: 31 g/dL (ref 30.0–36.0)
MCV: 92.8 fL (ref 80.0–100.0)
Platelets: 206 10*3/uL (ref 150–400)
RBC: 2.92 MIL/uL — ABNORMAL LOW (ref 3.87–5.11)
RDW: 13.2 % (ref 11.5–15.5)
WBC: 10.2 10*3/uL (ref 4.0–10.5)
nRBC: 0 % (ref 0.0–0.2)

## 2022-10-13 LAB — RENAL FUNCTION PANEL
Albumin: 2.9 g/dL — ABNORMAL LOW (ref 3.5–5.0)
Anion gap: 10 (ref 5–15)
BUN: 77 mg/dL — ABNORMAL HIGH (ref 6–20)
CO2: 17 mmol/L — ABNORMAL LOW (ref 22–32)
Calcium: 8.9 mg/dL (ref 8.9–10.3)
Chloride: 110 mmol/L (ref 98–111)
Creatinine, Ser: 7.41 mg/dL — ABNORMAL HIGH (ref 0.44–1.00)
GFR, Estimated: 6 mL/min — ABNORMAL LOW (ref >60)
Glucose, Bld: 147 mg/dL — ABNORMAL HIGH (ref 70–99)
Phosphorus: 4.3 mg/dL (ref 2.5–4.6)
Potassium: 5.3 mmol/L — ABNORMAL HIGH (ref 3.5–5.1)
Sodium: 137 mmol/L (ref 135–145)

## 2022-10-13 LAB — GLUCOSE, CAPILLARY
Glucose-Capillary: 138 mg/dL — ABNORMAL HIGH (ref 70–99)
Glucose-Capillary: 153 mg/dL — ABNORMAL HIGH (ref 70–99)
Glucose-Capillary: 141 mg/dL — ABNORMAL HIGH (ref 70–99)
Glucose-Capillary: 160 mg/dL — ABNORMAL HIGH (ref 70–99)

## 2022-10-13 MED ORDER — ACETAMINOPHEN 325 MG PO TABS: 650.0000 mg | ORAL_TABLET | ORAL | Status: DC | PRN

## 2022-10-13 MED ORDER — ASPIRIN-ACETAMINOPHEN-CAFFEINE 250-250-65 MG PO TABS
1.0000 | ORAL_TABLET | Freq: Once | ORAL | Status: AC
  Administered 2022-10-13: 1 via ORAL
  Filled 2022-10-13: qty 1

## 2022-10-13 MED ORDER — DARBEPOETIN ALFA 100 MCG/0.5ML IJ SOSY: 100.0000 ug | PREFILLED_SYRINGE | INTRAMUSCULAR | Status: DC

## 2022-10-13 MED ORDER — DARBEPOETIN ALFA 100 MCG/0.5ML IJ SOSY
100.0000 ug | PREFILLED_SYRINGE | INTRAMUSCULAR | Status: DC
  Filled 2022-10-13: qty 0.5

## 2022-10-13 MED ORDER — SODIUM ZIRCONIUM CYCLOSILICATE 10 G PO PACK
10.0000 g | PACK | Freq: Every day | ORAL | Status: DC
  Administered 2022-10-13 – 2022-10-14 (×2): 10 g via ORAL
  Filled 2022-10-13 (×2): qty 1

## 2022-10-13 NOTE — Progress Notes (Signed)
PROGRESS NOTE    Maureen Jones  SWN:[id number] DOB: 07/03/1966 DOA: 10/05/2022 PCP: Flossie Buffy, NP   Brief Narrative:  56 y.o. 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 She was admitted for acute kidney injury, slowly improving with IV fluids.  Creatinine started plateauing therefore nephrology consulted.  Also has persistent heartburn despite of Pepcid and PPI, GI consulted.  GI recommended treating with PPI twice daily and outpatient follow-up for endoscopy.  Due to rising creatinine and patient is very hesitant to start HD palliative care team consulted to help establish goals of care.   Assessment & Plan:  Principal Problem:   AKI (acute kidney injury) ([place]) Active Problems:   Vocal cord dysfunction   Tracheostomy dependence (HCC)   Resistant hypertension   OSA (obstructive sleep apnea)   DM (diabetes mellitus) (HCC)   CKD (chronic kidney disease) stage 5, GFR less than 15 ml/min (HCC)   Nausea vomiting and diarrhea   Morbid (severe) obesity due to excess calories (HCC)    Acute kidney injury on CKD stage V -Baseline creatinine 4.0, admission creatinine 7.56.  Suspect from nausea vomiting, prerenal.  Holding losartan and Aldactone.  Nephro following, creatinine rising, creatinine 7.5.  Although no uremic symptoms patient is hesitant to start hemodialysis.  We will consult palliative care to help establish goals of care.   Headache -patient says she has migraines -trial of medication  Nausea and vomiting with heartburn type symptoms, improved - Unclear etiology.  LFTs, lipase are normal.  CT abdomen pelvis without contrast is unremarkable.  Seen by GI, recommend outpatient endoscopy.  Continue PPI twice daily upon discharge  Anemia of chronic disease -  Hemoglobin down to 7.0.  No obvious evidence of bleeding, agreeable for 1 unit PRBC   Insulin-dependent diabetes mellitus type 2, uncontrolled due to hyperglycemia - While in acute kidney injury, Lower dose of insulin.   Increase Semglee to 18 units twice daily.  Sliding scale and Accu-Cheks   Tracheostomy dependence - Patient follows outpatient pulmonary.  Recently has undergone work-up for concerns of vocal cord dysfunction/subglottic stenosis.  ENT and pulmonary recommending periodic trach change, last trach change 2 days ago by outpatient pulmonary.  We will closely monitor here   Essential hypertension, uncontrolled - Continue Norvasc and hydralazine.  We will increase clonidine 0.3 mg 3 times daily. -IV as needed   GERD - PPI   Obesity Estimated body mass index is 39.78 kg/m as calculated from the following:   Height as of 07/22/22: 5\' 6"  (1.676 m).   Weight as of this encounter: 111.8 kg.    Hesitant to start HD, has Trach in place. Worsening Cr. Palliative care consulted to help establish GOC.  Patient knows if she needs HD and chooses not to take then she will die  DVT prophylaxis: SQ. heparin Code Status: Full code      Subjective: Headache this AM  Examination:   General: Appearance:    Obese female in no acute distress     Lungs:     respirations unlabored  Heart:    Normal heart rate. Normal rhythm. No murmurs, rubs, or gallops.   MS:   All extremities are intact.   Neurologic:   Awake, alert     Objective: Vitals:   10/12/22 2113 10/13/22 0418 10/13/22 0500 10/13/22 0946  BP:  Marland Kitchen)  148/91    Pulse: 70 75  72  Resp: 17 16  16   Temp:  98.5 F (36.9 C)    TempSrc:  Oral    SpO2: 99% 94%  100%  Weight:   111.8 kg     Intake/Output Summary (Last 24 hours) at 10/13/2022 1225 Last data filed at 10/13/2022 1138 Gross per 24 hour  Intake 360 ml  Output --  Net 360 ml   Filed Weights   10/10/22 0415 10/11/22 0500 10/13/22 0500  Weight: 111.9 kg 111.6  kg 111.8 kg     Data Reviewed:   CBC: Recent Labs  Lab 10/09/22 0214 10/10/22 0235 10/11/22 0311 10/12/22 0401 10/13/22 1022  WBC 10.2 9.2 9.7 10.7* 10.2  HGB 7.1* 7.2* 7.0* 8.0* 8.4*  HCT 22.4* 22.2* 22.3* 25.2* 27.1*  MCV 90.7 91.0 91.4 91.6 92.8  PLT 160 169 168 183 092   Basic Metabolic Panel: Recent Labs  Lab 10/08/22 0200 10/09/22 0214 10/10/22 0235 10/11/22 0311 10/12/22 0401 10/13/22 1022  NA 135 132* 136 137 137 137  K 4.6 4.4 4.9 5.1 5.2* 5.3*  CL 108 105 109 111 110 110  CO2 17* 17* 18* 18* 20* 17*  GLUCOSE 97 175* 155* 187* 154* 147*  BUN 57* 55* 65* 70* 72* 77*  CREATININE 6.81* 7.13* 7.25* 7.50* 7.52* 7.41*  CALCIUM 8.0* 8.1*  7.8* 8.4* 8.6* 8.9 8.9  MG 1.8 1.8 1.9 1.8 1.8  --   PHOS  --  4.8*  --   --   --  4.3   GFR: Estimated Creatinine Clearance: 10.9 mL/min (A) (by C-G formula based on SCr of 7.41 mg/dL (H)). Liver Function Tests: Recent Labs  Lab 10/13/22 1022  ALBUMIN 2.9*    No results for input(s): "LIPASE", "AMYLASE" in the last 168 hours.  No results for input(s): "AMMONIA" in the last 168 hours. Coagulation Profile: No results for input(s): "INR", "PROTIME" in the last 168 hours. Cardiac Enzymes: No results for input(s): "CKTOTAL", "CKMB", "CKMBINDEX", "TROPONINI" in the last 168 hours. BNP (last 3 results) Recent Labs    04/01/22 1147  PROBNP 306*   HbA1C: No results for input(s): "HGBA1C" in the last 72 hours. CBG: Recent Labs  Lab 10/12/22 1150 10/12/22 1617 10/12/22 2018 10/13/22 0838 10/13/22 1136  GLUCAP 202* 115* 143* 138* 153*   Lipid Profile: No results for input(s): "CHOL", "HDL", "LDLCALC", "TRIG", "CHOLHDL", "LDLDIRECT" in the last 72 hours. Thyroid Function Tests: No results for input(s): "TSH", "T4TOTAL", "FREET4", "T3FREE", "THYROIDAB" in the last 72 hours. Anemia Panel: No results for input(s): "VITAMINB12", "FOLATE", "FERRITIN", "TIBC", "IRON", "RETICCTPCT" in the last 72 hours.  Sepsis  Labs: No results for input(s): "PROCALCITON", "LATICACIDVEN" in the last 168 hours.  Recent Results (from the past 240 hour(s))  Resp Panel by RT-PCR (Flu A&B, Covid) Anterior Nasal Swab     Status: None   Collection Time: 10/05/22  5:11 PM   Specimen: Anterior Nasal Swab  Result Value Ref Range Status   SARS Coronavirus 2 by RT PCR NEGATIVE NEGATIVE Final    Comment: (NOTE) SARS-CoV-2 target nucleic acids are NOT DETECTED.  The SARS-CoV-2 RNA is generally detectable in upper respiratory specimens during the acute phase of infection. The lowest concentration of SARS-CoV-2 viral copies this assay can detect is 138 copies/mL. A negative result does not preclude SARS-Cov-2 infection and should not be used as the sole basis for treatment or other patient management decisions. A negative result may occur with  improper specimen  collection/handling, submission of specimen other than nasopharyngeal swab, presence of viral mutation(s) within the areas targeted by this assay, and inadequate number of viral copies(<138 copies/mL). A negative result must be combined with clinical observations, patient history, and epidemiological information. The expected result is Negative.  Fact Sheet for Patients:  EntrepreneurPulse.com.au  Fact Sheet for Healthcare Providers:  IncredibleEmployment.be  This test is no t yet approved or cleared by the [place] FDA and  has been authorized for detection and/or diagnosis of SARS-CoV-2 by FDA under an Emergency Use Authorization (EUA). This EUA will remain  in effect (meaning this test can be used) for the duration of the COVID-19 declaration under Section 564(b)(1) of the Act, 21 U.S.C.section 360bbb-3(b)(1), unless the authorization is terminated  or revoked sooner.       Influenza A by PCR NEGATIVE NEGATIVE Final   Influenza B by PCR NEGATIVE NEGATIVE Final    Comment: (NOTE) The Xpert Xpress  SARS-CoV-2/FLU/RSV plus assay is intended as an aid in the diagnosis of influenza from Nasopharyngeal swab specimens and should not be used as a sole basis for treatment. Nasal washings and aspirates are unacceptable for Xpert Xpress SARS-CoV-2/FLU/RSV testing.  Fact Sheet for Patients: EntrepreneurPulse.com.au  Fact Sheet for Healthcare Providers: IncredibleEmployment.be  This test is not yet approved or cleared by the [place] FDA and has been authorized for detection and/or diagnosis of SARS-CoV-2 by FDA under an Emergency Use Authorization (EUA). This EUA will remain in effect (meaning this test can be used) for the duration of the COVID-19 declaration under Section 564(b)(1) of the Act, 21 U.S.C. section 360bbb-3(b)(1), unless the authorization is terminated or revoked.  Performed at [facility], [place] [address], [place], [place] [address]          Radiology Studies: No results found.      Scheduled Meds:  amLODipine  10 mg Oral Daily   atorvastatin  20 mg Oral Daily   azelastine  2 spray Each Nare BID   carvedilol  25 mg Oral BID WC   cloNIDine  0.3 mg Oral TID   darbepoetin (ARANESP) injection - NON-DIALYSIS  100 mcg Subcutaneous Q Thu-1800   DULoxetine  30 mg Oral Daily   famotidine  20 mg Oral QHS   heparin  5,000 Units Subcutaneous Q8H   hydrALAZINE  100 mg Oral TID   insulin aspart  0-5 Units Subcutaneous QHS   insulin aspart  0-9 Units Subcutaneous TID WC   insulin glargine-yfgn  18 Units Subcutaneous BID   loratadine  10 mg Oral QPM   pantoprazole  40 mg Oral BID AC   sodium zirconium cyclosilicate  10 g Oral [id number]   traZODone  50 mg Oral QHS   Continuous Infusions:     LOS: 8 days   Time spent= 35 mins    Geradine Girt, DO [facility]  If 7PM-7AM, please contact night-coverage  10/13/2022, 12:25 PM

## 2022-10-13 NOTE — Consult Note (Signed)
Consultation Note Date: 10/13/2022   Patient Name: Maureen Jones  DOB: 12/22/1965  MRN: [id number]  Age / Sex: 56 y.o., female  PCP: Nche, Charlotte Lum, NP Referring Physician: Vann, Jessica U, DO  Reason for Consultation: Establishing goals of care  HPI/Patient Profile: 56 y.o. 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   Patient has been admitted for AKI on CKD stage V.  Likely due to nausea vomiting.  PMT has been consulted to assist with goals of care conversation.  Clinical Assessment and Goals of Care:  I have reviewed medical records including EPIC notes, labs and imaging, received report from RN, assessed the patient and then met at the bedside to discuss diagnosis prognosis, GOC, EOL wishes, disposition and options.  I introduced Palliative Medicine as specialized medical care for people living with serious illness. It focuses on providing relief from the symptoms and stress of a serious illness. The goal is to improve quality of life for both the patient and the family.  We discussed a brief life review of the patient and then focused on their current illness.  The natural disease trajectory and expectations at EOL were discussed.  I attempted to elicit values and goals of care important to the patient.    Medical History Review and Understanding:  We reviewed patient's acute illness and chronic comorbidities, focusing on her renal disease.  Provided clarification on her prognosis as she was not certain why her sister was so upset about an expectation of 2 to 6 weeks.  Patient is relieved to hear that this would be only applicable if she became uremic her renal function worsened and she  declined dialysis.  Social History: Patient is closest to her father (age 80).  She has a sister and an older brother who is intellectually disabled.  Her mother is deceased.  She is very spiritual but not involved with a church.  She is originally from [place] and actually had to push back her move to return there due to her ongoing illness.  She enjoys writing and reading.  She previously enjoyed gardening.  Palliative Symptoms: Fatigue  Advance Directives: A detailed discussion regarding advanced directives was had.  Patient is interested in completing documentation during this admission.   Code Status: Concepts specific to code status, artifical feeding and hydration, and rehospitalization were considered and discussed.   Discussion: Had a good conversation with [name], who shares her most important values are her family, respect, and loyalty.  She feels that there is room for improvement in terms of how she is respected outside of the hospital but does not elaborate.  She initially states that she does not have many hobbies that bring her joy but is then able to think of some things, as stated above.  Her faith is also important to her and helps her cope through her illness.  She feels that we are all going to die one day and she does not understand why her sister is "sad and moping around."  She has started to have some conversations about her wishes with her family, including that she wants to be cremated and celebrated with bright attire at her memorial rather than dark colors.  She has also thought about the song she would like to have played at the service.  Ultimately, she would   not want dialysis but it would be even worse for her to live her final days knowing that her father will have to bury her.  She feels that this will kill him.  Encouraged her to prioritize herself and her experience that she is only going through this.  She verbalized her understanding and shares that she has  been thinking about this.  She is comforted by the fact that she could always decide to discontinue dialysis.  Encouraged her to talk further with her family and even including specific instructions about this in her advanced directive.  She is very appreciative.   Discussed the importance of continued conversation with family and the medical providers regarding overall plan of care and treatment options, ensuring decisions are within the context of the patient's values and GOCs.   Questions and concerns were addressed.  Hard Choices booklet left for review. The family was encouraged to call with questions or concerns.  PMT will continue to support holistically.   SUMMARY OF RECOMMENDATIONS   -Continue full code/full scope treatment -Patient would prefer to avoid dialysis but may consider if it becomes more urgently necessary -Advanced directives and MOST form introduced today -Psychosocial and emotional support provided -PMT will continue to follow and support   Prognosis:  Guarded  Discharge Planning: To Be Determined      Primary Diagnoses: Present on Admission:  Vocal cord dysfunction  Resistant hypertension  OSA (obstructive sleep apnea)  CKD (chronic kidney disease) stage 5, GFR less than 15 ml/min (HCC)  Nausea vomiting and diarrhea  Morbid (severe) obesity due to excess calories (HCC)  Physical Exam Vitals and nursing note reviewed.  Constitutional:      General: She is not in acute distress.    Comments: Trach in place, 5 L O2  Cardiovascular:     Rate and Rhythm: Normal rate.  Pulmonary:     Effort: Pulmonary effort is normal.  Neurological:     Mental Status: She is alert and oriented to person, place, and time.  Psychiatric:        Attention and Perception: Attention normal.        Mood and Affect: Mood normal.        Behavior: Behavior normal.   Vital Signs: BP (!) 121/59 (BP Location: Right Arm)   Pulse 72   Temp 98.5 F (36.9 C) (Oral)   Resp 18    Wt 111.8 kg   SpO2 100%   BMI 39.78 kg/m  Pain Scale: 0-10   Pain Score: 8    SpO2: SpO2: 100 % O2 Device:SpO2: 100 % O2 Flow Rate: .O2 Flow Rate (L/min): 5 L/min  Palliative Assessment/Data:     Total time: I spent 90 minutes in the care of the patient today in the above activities and documenting the encounter.  MDM: High    P , PA-C  Palliative Medicine Team Team phone # [phone]  Thank you for allowing the Palliative Medicine Team to assist in the care of this patient. Please utilize secure chat with additional questions, if there is no response within 30 minutes please call the above phone number.  Palliative Medicine Team providers are available by phone from 7am to 7pm daily and can be reached through the team cell phone.  Should this patient require assistance outside of these hours, please call the patient's attending physician.    

## 2022-10-13 NOTE — Progress Notes (Signed)
[facility] Progress Note   Subjective:   Cr appears to be plateauing.  Tired today but no other issues.    Objective Vitals:   10/13/22 0418 10/13/22 0500 10/13/22 0946 10/13/22 1238  BP: (!) 148/91     Pulse: 75  72 76  Resp: 16  16 16   Temp: 98.5 F (36.9 C)     TempSrc: Oral     SpO2: 94%  100% 100%  Weight:  111.8 kg     Physical Exam General: NAD Heart:RRR no rub Lungs: clear, + trach Abdomen: soft, obese Extremities: no edema Neuro:  nonfocal   Additional Objective Labs: Basic Metabolic Panel: Recent Labs  Lab 10/09/22 0214 10/10/22 0235 10/11/22 0311 10/12/22 0401 10/13/22 1022  NA 132*   < > 137 137 137  K 4.4   < > 5.1 5.2* 5.3*  CL 105   < > 111 110 110  CO2 17*   < > 18* 20* 17*  GLUCOSE 175*   < > 187* 154* 147*  BUN 55*   < > 70* 72* 77*  CREATININE 7.13*   < > 7.50* 7.52* 7.41*  CALCIUM 8.1*  7.8*   < > 8.6* 8.9 8.9  PHOS 4.8*  --   --   --  4.3   < > = values in this interval not displayed.   Liver Function Tests: Recent Labs  Lab 10/13/22 1022  ALBUMIN 2.9*    No results for input(s): "LIPASE", "AMYLASE" in the last 168 hours.  CBC: Recent Labs  Lab 10/09/22 0214 10/10/22 0235 10/11/22 0311 10/12/22 0401 10/13/22 1022  WBC 10.2 9.2 9.7 10.7* 10.2  HGB 7.1* 7.2* 7.0* 8.0* 8.4*  HCT 22.4* 22.2* 22.3* 25.2* 27.1*  MCV 90.7 91.0 91.4 91.6 92.8  PLT 160 169 168 183 206   Blood Culture    Component Value Date/Time   SDES URINE, CLEAN CATCH 02/13/2022 2256   SPECREQUEST NONE 02/13/2022 2256   CULT (A) 02/13/2022 2256    80,000 COLONIES/mL AEROCOCCUS SPECIES Standardized susceptibility testing for this organism is not available. Performed at [facility], [place] [address], [place], [place] [address]    REPTSTATUS 02/15/2022 FINAL 02/13/2022 2256    Cardiac Enzymes: No results for input(s): "CKTOTAL", "CKMB", "CKMBINDEX", "TROPONINI" in the last 168 hours. CBG: Recent Labs  Lab 10/12/22 1150  10/12/22 1617 10/12/22 2018 10/13/22 0838 10/13/22 1136  GLUCAP 202* 115* 143* 138* 153*   Iron Studies:  No results for input(s): "IRON", "TIBC", "TRANSFERRIN", "FERRITIN" in the last 72 hours.  @lablastinr3 @ Studies/Results: No results found. Medications:    amLODipine  10 mg Oral Daily   atorvastatin  20 mg Oral Daily   azelastine  2 spray Each Nare BID   carvedilol  25 mg Oral BID WC   cloNIDine  0.3 mg Oral TID   darbepoetin (ARANESP) injection - NON-DIALYSIS  100 mcg Subcutaneous Q Thu-1800   DULoxetine  30 mg Oral Daily   famotidine  20 mg Oral QHS   heparin  5,000 Units Subcutaneous Q8H   hydrALAZINE  100 mg Oral TID   insulin aspart  0-5 Units Subcutaneous QHS   insulin aspart  0-9 Units Subcutaneous TID WC   insulin glargine-yfgn  18 Units Subcutaneous BID   loratadine  10 mg Oral QPM   pantoprazole  40 mg Oral BID AC   sodium zirconium cyclosilicate  10 g Oral [id number]   traZODone  50 mg Oral QHS  Assessment/Recommendations: Maureen Jones is a/an 56 y.o. female with a past medical history of CKD V, diabetes mellitis type 2 insulin dependent, chronic tracheostomy on 3 L, vocal cord dysfunction/subglottic stenosis, and HTN who presents to [facility] with nausea, vomiting and diarrhea. She was admitted for AKI.    Non-Oliguric AKI on CKD V:  suspect prerenal AKI progressed to ATN in the setting of V/D/poor po intake on background of advanced CKD.   No evidence of obstruction on CT abdomen/pelvis 10/25.  Suspect she has CKD secondary to poorly controlled HTN and DM. She had an outpt nephrology consult in 2021 at [facility] - noted to have nephrotic range proteinuria, neg serologic w/u and never followed up.  I do not think a renal biopsy would change current management.  - not uremic so no RRT indicated -cont po intake, no need for IVF -Cont holding losartan and aldactone  - initially firmly no dialysis- now is reconsidering- not uremic so we will continue to dialogue.    - I also discussed that she has advanced CKD and most likely--> if HD needed--> will not recover enough renal function to come off HD completely - palliative care c/d- appreciate assistance - cr looks like it's plateaued.    Normocytic anemia: Hb in 7s, no bleeding -iron sat 23%, will give 1 dose IV iron and start ESA--> given today -Transfuse Hgb <7.0- getting I u pRBCs today     Insulin-dependent diabetes mellitus type 2 Per primary team- Semglee 16 units twice daily. 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 Has recently undergone work-up for concerns of vocal cord dysfunction/subglottic stenosis.    Hypertension    Dispo: pending    Madelon Lips MD [facility] Pager [phone]

## 2022-10-13 NOTE — Telephone Encounter (Signed)
Hospital f/u scheduled for 11/23/22 at 9:30 am with Nevin Bloodgood, NP. Appointment date will appear on discharge AVS & available in mychart.

## 2022-10-13 NOTE — Progress Notes (Signed)
Patient seen today by trach team for consult.  No education is needed at this time.  All necessary equipment is at beside.   Will continue to follow for progression.  

## 2022-10-14 ENCOUNTER — Other Ambulatory Visit (HOSPITAL_COMMUNITY): Payer: Self-pay

## 2022-10-14 DIAGNOSIS — Z930 Tracheostomy status: Secondary | ICD-10-CM | POA: Diagnosis not present

## 2022-10-14 DIAGNOSIS — E1165 Type 2 diabetes mellitus with hyperglycemia: Secondary | ICD-10-CM | POA: Diagnosis not present

## 2022-10-14 DIAGNOSIS — N179 Acute kidney failure, unspecified: Secondary | ICD-10-CM | POA: Diagnosis not present

## 2022-10-14 DIAGNOSIS — N185 Chronic kidney disease, stage 5: Secondary | ICD-10-CM | POA: Diagnosis not present

## 2022-10-14 DIAGNOSIS — Z794 Long term (current) use of insulin: Secondary | ICD-10-CM

## 2022-10-14 LAB — GLUCOSE, CAPILLARY
Glucose-Capillary: 156 mg/dL — ABNORMAL HIGH (ref 70–99)
Glucose-Capillary: 169 mg/dL — ABNORMAL HIGH (ref 70–99)
Glucose-Capillary: 131 mg/dL — ABNORMAL HIGH (ref 70–99)

## 2022-10-14 LAB — BASIC METABOLIC PANEL
Anion gap: 13 (ref 5–15)
BUN: 77 mg/dL — ABNORMAL HIGH (ref 6–20)
CO2: 18 mmol/L — ABNORMAL LOW (ref 22–32)
Calcium: 8.9 mg/dL (ref 8.9–10.3)
Chloride: 107 mmol/L (ref 98–111)
Creatinine, Ser: 7.61 mg/dL — ABNORMAL HIGH (ref 0.44–1.00)
GFR, Estimated: 6 mL/min — ABNORMAL LOW (ref >60)
Glucose, Bld: 193 mg/dL — ABNORMAL HIGH (ref 70–99)
Potassium: 5.5 mmol/L — ABNORMAL HIGH (ref 3.5–5.1)
Sodium: 138 mmol/L (ref 135–145)

## 2022-10-14 LAB — CBC
HCT: 25.6 % — ABNORMAL LOW (ref 36.0–46.0)
Hemoglobin: 8 g/dL — ABNORMAL LOW (ref 12.0–15.0)
MCH: 29.1 pg (ref 26.0–34.0)
MCHC: 31.3 g/dL (ref 30.0–36.0)
MCV: 93.1 fL (ref 80.0–100.0)
Platelets: 222 10*3/uL (ref 150–400)
RBC: 2.75 MIL/uL — ABNORMAL LOW (ref 3.87–5.11)
RDW: 13.3 % (ref 11.5–15.5)
WBC: 10.4 10*3/uL (ref 4.0–10.5)
nRBC: 0 % (ref 0.0–0.2)

## 2022-10-14 MED ORDER — TRESIBA FLEXTOUCH 200 UNIT/ML ~~LOC~~ SOPN: 18.0000 [IU] | PEN_INJECTOR | Freq: Two times a day (BID) | SUBCUTANEOUS | 2 refills | Status: AC

## 2022-10-14 MED ORDER — DULOXETINE HCL 30 MG PO CPEP
30.0000 mg | ORAL_CAPSULE | Freq: Every day | ORAL | 3 refills | Status: DC
  Filled 2022-10-14: qty 30, 30d supply, fill #0

## 2022-10-14 MED ORDER — SODIUM ZIRCONIUM CYCLOSILICATE 10 G PO PACK
10.0000 g | PACK | Freq: Every day | ORAL | 0 refills | Status: DC
  Filled 2022-10-14: qty 30, 30d supply, fill #0

## 2022-10-14 MED ORDER — GUAIFENESIN 100 MG/5ML PO LIQD: 5.0000 mL | ORAL | 0 refills | Status: DC | PRN

## 2022-10-14 MED ORDER — CLONIDINE HCL 0.3 MG PO TABS
0.3000 mg | ORAL_TABLET | Freq: Three times a day (TID) | ORAL | 3 refills | Status: DC
  Filled 2022-10-14: qty 90, 30d supply, fill #0

## 2022-10-14 MED ORDER — SODIUM BICARBONATE 650 MG PO TABS
650.0000 mg | ORAL_TABLET | Freq: Two times a day (BID) | ORAL | 2 refills | Status: DC
  Filled 2022-10-14: qty 60, 30d supply, fill #0

## 2022-10-14 MED ORDER — SODIUM BICARBONATE 650 MG PO TABS
650.0000 mg | ORAL_TABLET | Freq: Two times a day (BID) | ORAL | Status: DC
  Administered 2022-10-14: 650 mg via ORAL
  Filled 2022-10-14: qty 1

## 2022-10-14 MED ORDER — SODIUM POLYSTYRENE SULFONATE 15 GM/60ML PO SUSP
15.0000 g | Freq: Every day | ORAL | 5 refills | Status: DC
  Filled 2022-10-14: qty 120, 2d supply, fill #0
  Filled 2022-10-14 (×2): qty 946, 15d supply, fill #0

## 2022-10-14 MED ORDER — FAMOTIDINE 20 MG PO TABS
20.0000 mg | ORAL_TABLET | Freq: Every day | ORAL | 0 refills | Status: DC
  Filled 2022-10-14: qty 30, 30d supply, fill #0

## 2022-10-14 NOTE — TOC Progression Note (Addendum)
Transition of Care [facility]) - Progression Note    Patient Details  Name: Maureen Jones MRN: [id number] Date of Birth: 05/29/1966  Transition of [facility]) CM/SW Contact  Jacalyn Lefevre Edson Snowball, RN Phone Number: 10/14/2022, 2:21 PM  Clinical Narrative:     Spoke to patient at bedside. Patient requesting cab voucher home.   Discussed outpatient palliative services. Discussed difference between palliative and hospice. Patient does not feel that she needs palliative services at this time. If she changes her mind she is aware that her PCP can arrange.  While NCM at bedside patient's sister , pastor and his wife and another visitor came to see patient.   Sister and pastor and first lady requesting to speak to a doctor . Patient consenting. Dr Eliseo Squires called into the room and spoke with them.   They have not heard from [employer]. Sister reports that the concentrator is working, the Games developer to refill the portable tanks is not working. She will need a portable tank to go home with. Sister will transport her home. Letta Kocher with [facility] aware  and will have portable tank delivered to room and have charger checked.   Expected Discharge Plan: Home/Self Care Barriers to Discharge: Continued Medical Work up  Expected Discharge Plan and Services Expected Discharge Plan: Home/Self Care   Discharge Planning Services: CM Consult   Living arrangements for the past 2 months: Single Family Home Expected Discharge Date: 10/14/22               DME Arranged:  (see note)         HH Arranged: NA           Social Determinants of Health (SDOH) Interventions    Readmission Risk Interventions    12/13/2021   12:35 PM 11/15/2021   10:13 AM  Readmission Risk Prevention Plan  Transportation Screening Complete Complete  PCP or Specialist Appt within 3-5 Days Complete Complete  HRI or Home Care Consult Complete Complete  Social Work Consult for [place] Planning/Counseling Complete Patient refused   Palliative Care Screening Not Applicable Not Applicable  Medication Review Press photographer) Complete Complete

## 2022-10-14 NOTE — Progress Notes (Signed)
Daily Progress Note   Patient Name: Maureen Jones       Date: 10/14/2022 DOB: [birth year]  Age: 56 y.o. MRN#: [id number] Attending Physician: Geradine Girt, DO Primary Care Physician: Flossie Buffy, NP Admit Date: 10/05/2022  Reason for Consultation/Follow-up: Establishing goals of care  Subjective: Medical records reviewed. Patient assessed at the bedside.  She has no complaints today, is sitting up on the edge of her bed eating her lunch. No family present during my visit.  Created space and opportunity for thoughts and feelings on her current illness.  She is looking forward to going home today and hopes to speak with social work team before discharge.  Offered to advocate for this and she is appreciative.  Outpatient palliative care was explained and offered.  She is agreeable to referral.  We discussed her completion of advanced directives today and I offered to assist with the MOST form as well.  We discussed the differences and counseled on the importance of both.  She would like to continue reviewing the form and will complete as an outpatient.  Questions and concerns addressed. PMT will continue to support holistically.   Length of Stay: 9   Physical Exam Vitals and nursing note reviewed.  Constitutional:      General: She is not in acute distress. Cardiovascular:     Rate and Rhythm: Normal rate.  Pulmonary:     Effort: Pulmonary effort is normal. No respiratory distress.  Skin:    General: Skin is warm and dry.  Neurological:     Mental Status: She is alert and oriented to person, place, and time.  Psychiatric:        Mood and Affect: Mood normal.        Behavior: Behavior normal.   Vital Signs: BP (!) 163/78 (BP Location: Left Arm)   Pulse 76   Temp 98.8 F  (37.1 C) (Oral)   Resp 16   Wt 111.8 kg   SpO2 98%   BMI 39.78 kg/m  SpO2: SpO2: 98 % O2 Device: O2 Device: Tracheostomy Collar O2 Flow Rate: O2 Flow Rate (L/min): 5 L/min      Palliative Assessment/Data:    Palliative Care Assessment & Plan   Patient Profile: 56 y.o. 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    Patient has been admitted for AKI on CKD stage V.  Likely due to nausea vomiting.  PMT has been consulted to assist with goals of care conversation.  Assessment: Goals of care conversation CKD stage V Insulin-dependent diabetes type 2 Chronic tracheostomy on 4 L oxygen Vocal cord dysfunction  Recommendations/Plan: Continue full code/full scope treatment Patient defers MOST form completion at this time [facility] assistance with outpatient palliative care referral is appreciated Psychosocial and emotional support provided PMT will continue to follow and support as needed  Prognosis:  Unable to determine  Discharge Planning: Home with Palliative Services  Care plan was discussed with patient, TOC   MDM high         Young Mulvey Johnnette Litter, PA-C  Palliative Medicine Team Team phone # [phone]  Thank you for allowing the Palliative Medicine Team to assist in the care of this patient. Please utilize secure chat with additional questions, if there is no response within 30 minutes please call the above phone number.  Palliative Medicine Team providers are available by phone from 7am to 7pm daily and can be reached through the team cell phone.  Should this patient require assistance outside of these hours, please call the patient's attending physician.

## 2022-10-14 NOTE — Progress Notes (Signed)
Patient discharged by [facility] nurse

## 2022-10-14 NOTE — Progress Notes (Signed)
PROGRESS NOTE    Maureen Jones  EXB:[id number] DOB: 1966-09-27 DOA: 10/05/2022 PCP: Flossie Buffy, NP   Brief Narrative:  56 y.o. 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 She was admitted for acute kidney injury, slowly improving with IV fluids.  Creatinine started plateauing therefore nephrology consulted.  Also has persistent heartburn despite of Pepcid and PPI, GI consulted.  GI recommended treating with PPI twice daily and outpatient follow-up for endoscopy.  Due to rising creatinine and patient is very hesitant to start HD palliative care team consulted to help establish goals of care.   Assessment & Plan:  Principal Problem:   AKI (acute kidney injury) ([place]) Active Problems:   Vocal cord dysfunction   Tracheostomy dependence (HCC)   Resistant hypertension   OSA (obstructive sleep apnea)   DM (diabetes mellitus) (HCC)   CKD (chronic kidney disease) stage 5, GFR less than 15 ml/min (HCC)   Nausea vomiting and diarrhea   Morbid (severe) obesity due to excess calories (HCC)    Acute kidney injury on CKD stage V -Baseline creatinine 4.0, admission creatinine 7.56.  Suspect from nausea vomiting, prerenal.  Holding losartan and Aldactone.  Nephro following, creatinine rising, creatinine 7.5.  Although no uremic symptoms patient is hesitant to start hemodialysis.  We will consult palliative care to help establish goals of care.   Headache -patient says she has migraines -trial of medication worked well-- resolved  Nausea and vomiting with heartburn type symptoms, improved - Unclear etiology.  LFTs, lipase are normal.  CT abdomen pelvis without contrast is unremarkable.  Seen by GI, recommend outpatient endoscopy.  Continue PPI twice daily upon discharge  Anemia of  chronic disease - Hemoglobin down to 7.0.  No obvious evidence of bleeding, agreeable for 1 unit PRBC   Insulin-dependent diabetes mellitus type 2, uncontrolled due to hyperglycemia - While in acute kidney injury, Lower dose of insulin.   Increase Semglee to 18 units twice daily.  Sliding scale and Accu-Cheks   Tracheostomy dependence - Patient follows outpatient pulmonary.  Recently has undergone work-up for concerns of vocal cord dysfunction/subglottic stenosis.  ENT and pulmonary recommending periodic trach change, last trach change 2 days ago by outpatient pulmonary.  We will closely monitor here   Essential hypertension, uncontrolled - Continue Norvasc and hydralazine. - clonidine 0.3 mg 3 times daily. -IV as needed   GERD - PPI   Obesity Estimated body mass index is 39.78 kg/m as calculated from the following:   Height as of 07/22/22: 5\' 6"  (1.676 m).   Weight as of this encounter: 111.8 kg.    Hesitant to start HD, has Trach in place. Worsening Cr. Palliative care consulted to help establish GOC.  Patient knows if she needs HD and chooses not to take then she will die  DVT prophylaxis: SQ. heparin Code Status: Full code      Subjective: Headache better  Examination:    General: Appearance:    Obese female in no acute distress     Lungs:     respirations unlabored  Heart:    Normal heart rate.   MS:   All extremities are intact.   Neurologic:   Awake, alert       Objective: Vitals:   10/14/22 0204 10/14/22 0624 10/14/22 0848 10/14/22 0917  BP:  (!) 158/63 (!) 163/78  Pulse: 72 84 75 76  Resp: 16 18 17 16   Temp:  98.8 F (37.1 C) 98.8 F (37.1 C)   TempSrc:  Oral Oral   SpO2: 100% 100% 97% 98%  Weight:       No intake or output data in the 24 hours ending 10/14/22 1234  Filed Weights   10/10/22 0415 10/11/22 0500 10/13/22 0500  Weight: 111.9 kg 111.6 kg 111.8 kg     Data Reviewed:   CBC: Recent Labs  Lab 10/10/22 0235 10/11/22 0311  10/12/22 0401 10/13/22 1022 10/14/22 0245  WBC 9.2 9.7 10.7* 10.2 10.4  HGB 7.2* 7.0* 8.0* 8.4* 8.0*  HCT 22.2* 22.3* 25.2* 27.1* 25.6*  MCV 91.0 91.4 91.6 92.8 93.1  PLT 169 168 183 206 462   Basic Metabolic Panel: Recent Labs  Lab 10/08/22 0200 10/09/22 0214 10/10/22 0235 10/11/22 0311 10/12/22 0401 10/13/22 1022 10/14/22 0245  NA 135 132* 136 137 137 137 138  K 4.6 4.4 4.9 5.1 5.2* 5.3* 5.5*  CL 108 105 109 111 110 110 107  CO2 17* 17* 18* 18* 20* 17* 18*  GLUCOSE 97 175* 155* 187* 154* 147* 193*  BUN 57* 55* 65* 70* 72* 77* 77*  CREATININE 6.81* 7.13* 7.25* 7.50* 7.52* 7.41* 7.61*  CALCIUM 8.0* 8.1*  7.8* 8.4* 8.6* 8.9 8.9 8.9  MG 1.8 1.8 1.9 1.8 1.8  --   --   PHOS  --  4.8*  --   --   --  4.3  --    GFR: Estimated Creatinine Clearance: 10.6 mL/min (A) (by C-G formula based on SCr of 7.61 mg/dL (H)). Liver Function Tests: Recent Labs  Lab 10/13/22 1022  ALBUMIN 2.9*    No results for input(s): "LIPASE", "AMYLASE" in the last 168 hours.  No results for input(s): "AMMONIA" in the last 168 hours. Coagulation Profile: No results for input(s): "INR", "PROTIME" in the last 168 hours. Cardiac Enzymes: No results for input(s): "CKTOTAL", "CKMB", "CKMBINDEX", "TROPONINI" in the last 168 hours. BNP (last 3 results) Recent Labs    04/01/22 1147  PROBNP 306*   HbA1C: No results for input(s): "HGBA1C" in the last 72 hours. CBG: Recent Labs  Lab 10/13/22 0838 10/13/22 1136 10/13/22 1727 10/13/22 2232 10/14/22 0846  GLUCAP 138* 153* 141* 160* 156*   Lipid Profile: No results for input(s): "CHOL", "HDL", "LDLCALC", "TRIG", "CHOLHDL", "LDLDIRECT" in the last 72 hours. Thyroid Function Tests: No results for input(s): "TSH", "T4TOTAL", "FREET4", "T3FREE", "THYROIDAB" in the last 72 hours. Anemia Panel: No results for input(s): "VITAMINB12", "FOLATE", "FERRITIN", "TIBC", "IRON", "RETICCTPCT" in the last 72 hours.  Sepsis Labs: No results for input(s):  "PROCALCITON", "LATICACIDVEN" in the last 168 hours.  Recent Results (from the past 240 hour(s))  Resp Panel by RT-PCR (Flu A&B, Covid) Anterior Nasal Swab     Status: None   Collection Time: 10/05/22  5:11 PM   Specimen: Anterior Nasal Swab  Result Value Ref Range Status   SARS Coronavirus 2 by RT PCR NEGATIVE NEGATIVE Final    Comment: (NOTE) SARS-CoV-2 target nucleic acids are NOT DETECTED.  The SARS-CoV-2 RNA is generally detectable in upper respiratory specimens during the acute phase of infection. The lowest concentration of SARS-CoV-2 viral copies this assay can detect is 138 copies/mL. A negative result does not preclude SARS-Cov-2 infection and should not be used as the sole basis for treatment or other patient management decisions. A negative result may occur with  improper specimen collection/handling, submission of specimen other  than nasopharyngeal swab, presence of viral mutation(s) within the areas targeted by this assay, and inadequate number of viral copies(<138 copies/mL). A negative result must be combined with clinical observations, patient history, and epidemiological information. The expected result is Negative.  Fact Sheet for Patients:  EntrepreneurPulse.com.au  Fact Sheet for Healthcare Providers:  IncredibleEmployment.be  This test is no t yet approved or cleared by the [place] FDA and  has been authorized for detection and/or diagnosis of SARS-CoV-2 by FDA under an Emergency Use Authorization (EUA). This EUA will remain  in effect (meaning this test can be used) for the duration of the COVID-19 declaration under Section 564(b)(1) of the Act, 21 U.S.C.section 360bbb-3(b)(1), unless the authorization is terminated  or revoked sooner.       Influenza A by PCR NEGATIVE NEGATIVE Final   Influenza B by PCR NEGATIVE NEGATIVE Final    Comment: (NOTE) The Xpert Xpress SARS-CoV-2/FLU/RSV plus assay is intended as an  aid in the diagnosis of influenza from Nasopharyngeal swab specimens and should not be used as a sole basis for treatment. Nasal washings and aspirates are unacceptable for Xpert Xpress SARS-CoV-2/FLU/RSV testing.  Fact Sheet for Patients: EntrepreneurPulse.com.au  Fact Sheet for Healthcare Providers: IncredibleEmployment.be  This test is not yet approved or cleared by the [place] FDA and has been authorized for detection and/or diagnosis of SARS-CoV-2 by FDA under an Emergency Use Authorization (EUA). This EUA will remain in effect (meaning this test can be used) for the duration of the COVID-19 declaration under Section 564(b)(1) of the Act, 21 U.S.C. section 360bbb-3(b)(1), unless the authorization is terminated or revoked.  Performed at [facility], [place] [address], [place], [place] [address]          Radiology Studies: No results found.      Scheduled Meds:  amLODipine  10 mg Oral Daily   atorvastatin  20 mg Oral Daily   azelastine  2 spray Each Nare BID   carvedilol  25 mg Oral BID WC   cloNIDine  0.3 mg Oral TID   [START ON 10/16/2022] darbepoetin (ARANESP) injection - NON-DIALYSIS  100 mcg Subcutaneous Q Thu-1800   DULoxetine  30 mg Oral Daily   famotidine  20 mg Oral QHS   heparin  5,000 Units Subcutaneous Q8H   hydrALAZINE  100 mg Oral TID   insulin aspart  0-5 Units Subcutaneous QHS   insulin aspart  0-9 Units Subcutaneous TID WC   insulin glargine-yfgn  18 Units Subcutaneous BID   loratadine  10 mg Oral QPM   pantoprazole  40 mg Oral BID AC   sodium zirconium cyclosilicate  10 g Oral [id number]   traZODone  50 mg Oral QHS   Continuous Infusions:     LOS: 9 days   Time spent= 35 mins    Geradine Girt, DO [facility]  If 7PM-7AM, please contact night-coverage  10/14/2022, 12:34 PM

## 2022-10-14 NOTE — Progress Notes (Signed)
Per Tonia Ghent made changes to patient contact list order. Father Carloyn Manner is first, cousin Caren Griffins is second, and sister Kristen Loader is third. List updated!

## 2022-10-14 NOTE — Progress Notes (Signed)
This chaplain responded to PMT consult for creating/updating the Pt. Advance Directive:  HCPOA and Living Will.  AD education was completed wiith an opportunity for the Pt.to ask clarifying questions.  The chaplain is present with the Pt., notary and two witnesses for notarizing of the Pt AD. The Pt. named Maureen Jones as her healthcare agent. If this person is unable or unwilling to serve as her healthcare agent the Pt. second choice is [name].  The Pt. completed a Living Will.   The chaplain gave the original AD along with two copies to the Pt.. The chaplain scanned the Pt. AD into her EMR.  The chaplain is available for F/U spiritual care as needed.  Chaplain Sallyanne Kuster [phone]

## 2022-10-14 NOTE — Progress Notes (Signed)
[facility] Progress Note   Subjective:   seen by palliative care yesterday.  No uremic symptoms.  Eager to d/c today.    Objective Vitals:   10/14/22 0624 10/14/22 0848 10/14/22 0917 10/14/22 1245  BP: (!) 158/63 (!) 163/78    Pulse: 84 75 76   Resp: 18 17 16    Temp: 98.8 F (37.1 C) 98.8 F (37.1 C)    TempSrc: Oral Oral    SpO2: 100% 97% 98% 98%  Weight:       Physical Exam General: NAD Heart:RRR no rub Lungs: clear, + trach Abdomen: soft, obese Extremities: no edema Neuro:  nonfocal   Additional Objective Labs: Basic Metabolic Panel: Recent Labs  Lab 10/09/22 0214 10/10/22 0235 10/12/22 0401 10/13/22 1022 10/14/22 0245  NA 132*   < > 137 137 138  K 4.4   < > 5.2* 5.3* 5.5*  CL 105   < > 110 110 107  CO2 17*   < > 20* 17* 18*  GLUCOSE 175*   < > 154* 147* 193*  BUN 55*   < > 72* 77* 77*  CREATININE 7.13*   < > 7.52* 7.41* 7.61*  CALCIUM 8.1*  7.8*   < > 8.9 8.9 8.9  PHOS 4.8*  --   --  4.3  --    < > = values in this interval not displayed.   Liver Function Tests: Recent Labs  Lab 10/13/22 1022  ALBUMIN 2.9*    No results for input(s): "LIPASE", "AMYLASE" in the last 168 hours.  CBC: Recent Labs  Lab 10/10/22 0235 10/11/22 0311 10/12/22 0401 10/13/22 1022 10/14/22 0245  WBC 9.2 9.7 10.7* 10.2 10.4  HGB 7.2* 7.0* 8.0* 8.4* 8.0*  HCT 22.2* 22.3* 25.2* 27.1* 25.6*  MCV 91.0 91.4 91.6 92.8 93.1  PLT 169 168 183 206 222   Blood Culture    Component Value Date/Time   SDES URINE, CLEAN CATCH 02/13/2022 2256   SPECREQUEST NONE 02/13/2022 2256   CULT (A) 02/13/2022 2256    80,000 COLONIES/mL AEROCOCCUS SPECIES Standardized susceptibility testing for this organism is not available. Performed at [facility], [place] [address], [place], [place] [address]    REPTSTATUS 02/15/2022 FINAL 02/13/2022 2256    Cardiac Enzymes: No results for input(s): "CKTOTAL", "CKMB", "CKMBINDEX", "TROPONINI" in the last 168  hours. CBG: Recent Labs  Lab 10/13/22 1136 10/13/22 1727 10/13/22 2232 10/14/22 0846 10/14/22 1236  GLUCAP 153* 141* 160* 156* 169*   Iron Studies:  No results for input(s): "IRON", "TIBC", "TRANSFERRIN", "FERRITIN" in the last 72 hours.  @lablastinr3 @ Studies/Results: No results found. Medications:    amLODipine  10 mg Oral Daily   atorvastatin  20 mg Oral Daily   azelastine  2 spray Each Nare BID   carvedilol  25 mg Oral BID WC   cloNIDine  0.3 mg Oral TID   [START ON 10/16/2022] darbepoetin (ARANESP) injection - NON-DIALYSIS  100 mcg Subcutaneous Q Thu-1800   DULoxetine  30 mg Oral Daily   famotidine  20 mg Oral QHS   heparin  5,000 Units Subcutaneous Q8H   hydrALAZINE  100 mg Oral TID   insulin aspart  0-5 Units Subcutaneous QHS   insulin aspart  0-9 Units Subcutaneous TID WC   insulin glargine-yfgn  18 Units Subcutaneous BID   loratadine  10 mg Oral QPM   pantoprazole  40 mg Oral BID AC   sodium bicarbonate  650 mg Oral BID   sodium zirconium  cyclosilicate  10 g Oral [id number]   traZODone  50 mg Oral QHS    Assessment/Recommendations: Maureen Jones is a/an 56 y.o. female with a past medical history of CKD V, diabetes mellitis type 2 insulin dependent, chronic tracheostomy on 3 L, vocal cord dysfunction/subglottic stenosis, and HTN who presents to [facility] with nausea, vomiting and diarrhea. She was admitted for AKI.    Non-Oliguric AKI on CKD V:  suspect prerenal AKI progressed to ATN in the setting of V/D/poor po intake on background of advanced CKD.   No evidence of obstruction on CT abdomen/pelvis 10/25.  Suspect she has CKD secondary to poorly controlled HTN and DM. She had an outpt nephrology consult in 2021 at [facility] - noted to have nephrotic range proteinuria, neg serologic w/u and never followed up.  I do not think a renal biopsy would change current management.  - not uremic so no RRT indicated -cont po intake, no need for IVF -Cont holding losartan and  aldactone  - initially firmly no dialysis- now is reconsidering- not uremic so we will continue to dialogue.   - I also discussed that she has advanced CKD and most likely--> if HD needed--> will not recover enough renal function to come off HD completely - palliative care c/s- appreciate assistance - cr looks like it's plateaued.   - do lokelma 10 g daily and sodium bicarb 650 mg BID - ok for d/c today with close followup with me- have messaged my scheduler  Normocytic anemia: Hb in 7s, no bleeding -iron sat 23%, will give 1 dose IV iron and start ESA--> given -Transfuse Hgb <7.0- getting I u pRBCs today     Insulin-dependent diabetes mellitus type 2 Per primary team- Semglee 16 units twice daily. 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 Has recently undergone work-up for concerns of vocal cord dysfunction/subglottic stenosis.    Hypertension    Dispo: d/c ok from renal perspective today  Madelon Lips MD [place] Pager [phone]

## 2022-10-14 NOTE — Discharge Summary (Addendum)
Physician Discharge Summary  Maureen Jones ZSW:[id number] DOB: 1966/02/07 DOA: 10/05/2022  PCP: Flossie Buffy, NP  Admit date: 10/05/2022 Discharge date: 10/14/2022  Admitted From: home Discharge disposition: home   Recommendations for Outpatient Follow-Up:   Close renal follow up BMP 1 week re: K and Cr Outpatient endoscopy    Discharge Diagnosis:   Principal Problem:   AKI (acute kidney injury) ([place]) Active Problems:   Vocal cord dysfunction   Tracheostomy dependence ([place])   Resistant hypertension   OSA (obstructive sleep apnea)   DM (diabetes mellitus) ([place])   CKD (chronic kidney disease) stage 5, GFR less than 15 ml/min (HCC)   Nausea vomiting and diarrhea   Morbid (severe) obesity due to excess calories [facility])    Discharge Condition: Improved.  Diet recommendation: renal/carb mod  Wound care: None.  Code status: Full.   History of Present Illness:   Maureen Jones is a 56 y.o. 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  Patient states she has had off-and-on nausea vomiting for the past 5-7 days with poor oral intake.  But her symptoms worsen over last 24 hours therefore came to the hospital.  Denies any hematemesis or melena.  Reports of some pain in the right lower quadrant, denies any history of renal stones.  Tells me a urine has been mostly yellow but on the darker side of the last few days.  Denies any fevers, chills or other complaints.  Denies eating anything out of ordinary recently as well.   In the ER she was noted to have creatinine of 7.56, baseline 4.0, BUN 67, potassium 4.9.  CT of the abdomen pelvis without contrast was unremarkable.  Initially also hypertensive.  She is being admitted for AKI.  ED provider to consult  nephrology.   Hospital Course by Problem:   Acute kidney injury on CKD stage V -Baseline creatinine 4.0, admission creatinine 7.56.  Suspect from nausea vomiting, prerenal.  Holding losartan and Aldactone.  Nephro following outpatient- will follow closely - bicarb are new meds- patient unable to afford lokelma so will use kayexelate instead -may need HD started outpatient .   Headache -patient says she has migraines -trial of medication worked well-- resolved   Nausea and vomiting with heartburn type symptoms, improved - Unclear etiology.  LFTs, lipase are normal.  CT abdomen pelvis without contrast is unremarkable.  Seen by GI, recommend outpatient endoscopy.  Continue PPI twice daily upon discharge   Anemia of chronic disease - s/p 1 unit PRBC   Insulin-dependent diabetes mellitus type 2, uncontrolled due to hyperglycemia - lower dose of home insulin   Tracheostomy dependence - Patient follows outpatient pulmonary.  Recently has undergone work-up for concerns of vocal cord dysfunction/subglottic stenosis.  ENT and pulmonary recommending periodic trach change, last trach change 2 days ago by outpatient pulmonary.     Essential hypertension, uncontrolled - Continue Norvasc and hydralazine. - clonidine 0.3 mg 3 times daily.    GERD - PPI   Obesity Estimated body mass index is 39.78 kg/m as calculated from the following:   Height as of 07/22/22: 5\' 6"  (1.676 m).   Weight as of this encounter: 111.8 kg.     Medical Consultants:   Palliative renal   Discharge Exam:   Vitals:   10/14/22 1245 10/14/22 1526  BP:  (!) 152/72  Pulse:    Resp:    Temp:    SpO2: 98%    Vitals:   10/14/22 0848 10/14/22 0917 10/14/22 1245 10/14/22 1526  BP: (!) 163/78   (!) 152/72  Pulse: 75 76    Resp: 17 16    Temp: 98.8 F (37.1 C)     TempSrc: Oral     SpO2: 97% 98% 98%   Weight:        General exam: Appears calm and comfortable.    The results of significant diagnostics  from this hospitalization (including imaging, microbiology, ancillary and laboratory) are listed below for reference.     Procedures and Diagnostic Studies:   CT ABDOMEN PELVIS WO CONTRAST  Result Date: 10/05/2022 CLINICAL DATA:  Right lower quadrant abdominal pain. EXAM: CT ABDOMEN AND PELVIS WITHOUT CONTRAST TECHNIQUE: Multidetector CT imaging of the abdomen and pelvis was performed following the standard protocol without IV contrast. RADIATION DOSE REDUCTION: This exam was performed according to the departmental dose-optimization program which includes automated exposure control, adjustment of the mA and/or kV according to patient size and/or use of iterative reconstruction technique. COMPARISON:  Abdominopelvic CT 02/13/2022 FINDINGS: Lower chest: Interval improved aeration of the lung bases with mild subsegmental atelectasis. Stable small pericardial effusion and small hiatal hernia. Hepatobiliary: The liver is normal in density without suspicious focal abnormality. No evidence of gallstones, gallbladder wall thickening or biliary dilatation. The gallbladder appears unchanged. Pancreas: Unremarkable. No pancreatic ductal dilatation or surrounding inflammatory changes. Spleen: Normal in size without focal abnormality. Adrenals/Urinary Tract: Both adrenal glands appear stable. Unchanged perinephric soft tissue stranding bilaterally. No hydronephrosis or ureteral calculus. The bladder appears unremarkable for its degree of distention. Stomach/Bowel: No enteric contrast administered. As above, small hiatal hernia with mild distal esophageal wall thickening. The stomach appears unremarkable for its degree of distention. No evidence of bowel distension, wall thickening or focal surrounding inflammation. The appendix is not clearly seen and may be surgically absent. Vascular/Lymphatic: Stable small retroperitoneal lymph nodes, likely reactive. Mild aortic and branch vessel atherosclerosis without evidence of  aneurysm. Reproductive: Hysterectomy.  No adnexal mass. Other: Unchanged perinephric and retroperitoneal soft tissue stranding without focal fluid collection or ascites. No free air. Mild edema in the subcutaneous fat. No evidence of abdominal wall hernia. Musculoskeletal: No acute or significant osseous findings. Mild thoracolumbar spondylosis. IMPRESSION: 1. No acute findings or explanation for the patient's symptoms. 2. Stable nonspecific perinephric and retroperitoneal soft tissue stranding without focal fluid collection or hydronephrosis. 3. Stable small hiatal hernia with mild distal esophageal wall thickening. 4.  Aortic Atherosclerosis (ICD10-I70.0). Electronically Signed   By: Richardean Sale M.D.   On: 10/05/2022 17:05     Labs:   Basic Metabolic Panel: Recent Labs  Lab 10/08/22 0200 10/09/22 0214 10/10/22 0235 10/11/22 0311 10/12/22 0401 10/13/22 1022 10/14/22 0245  NA 135 132* 136 137 137 137 138  K 4.6 4.4 4.9 5.1 5.2* 5.3* 5.5*  CL 108 105 109 111 110 110 107  CO2 17* 17* 18* 18* 20* 17* 18*  GLUCOSE 97 175* 155* 187* 154* 147* 193*  BUN 57* 55* 65* 70* 72* 77* 77*  CREATININE 6.81* 7.13* 7.25* 7.50* 7.52* 7.41* 7.61*  CALCIUM 8.0* 8.1*  7.8* 8.4* 8.6* 8.9 8.9 8.9  MG 1.8 1.8 1.9 1.8 1.8  --   --   PHOS  --  4.8*  --   --   --  4.3  --    GFR Estimated Creatinine  Clearance: 10.6 mL/min (A) (by C-G formula based on SCr of 7.61 mg/dL (H)). Liver Function Tests: Recent Labs  Lab 10/13/22 1022  ALBUMIN 2.9*   No results for input(s): "LIPASE", "AMYLASE" in the last 168 hours. No results for input(s): "AMMONIA" in the last 168 hours. Coagulation profile No results for input(s): "INR", "PROTIME" in the last 168 hours.  CBC: Recent Labs  Lab 10/10/22 0235 10/11/22 0311 10/12/22 0401 10/13/22 1022 10/14/22 0245  WBC 9.2 9.7 10.7* 10.2 10.4  HGB 7.2* 7.0* 8.0* 8.4* 8.0*  HCT 22.2* 22.3* 25.2* 27.1* 25.6*  MCV 91.0 91.4 91.6 92.8 93.1  PLT 169 168 183 206 222    Cardiac Enzymes: No results for input(s): "CKTOTAL", "CKMB", "CKMBINDEX", "TROPONINI" in the last 168 hours. BNP: Invalid input(s): "POCBNP" CBG: Recent Labs  Lab 10/13/22 1136 10/13/22 1727 10/13/22 2232 10/14/22 0846 10/14/22 1236  GLUCAP 153* 141* 160* 156* 169*   D-Dimer No results for input(s): "DDIMER" in the last 72 hours. Hgb A1c No results for input(s): "HGBA1C" in the last 72 hours. Lipid Profile No results for input(s): "CHOL", "HDL", "LDLCALC", "TRIG", "CHOLHDL", "LDLDIRECT" in the last 72 hours. Thyroid function studies No results for input(s): "TSH", "T4TOTAL", "T3FREE", "THYROIDAB" in the last 72 hours.  Invalid input(s): "FREET3" Anemia work up No results for input(s): "VITAMINB12", "FOLATE", "FERRITIN", "TIBC", "IRON", "RETICCTPCT" in the last 72 hours. Microbiology Recent Results (from the past 240 hour(s))  Resp Panel by RT-PCR (Flu A&B, Covid) Anterior Nasal Swab     Status: None   Collection Time: 10/05/22  5:11 PM   Specimen: Anterior Nasal Swab  Result Value Ref Range Status   SARS Coronavirus 2 by RT PCR NEGATIVE NEGATIVE Final    Comment: (NOTE) SARS-CoV-2 target nucleic acids are NOT DETECTED.  The SARS-CoV-2 RNA is generally detectable in upper respiratory specimens during the acute phase of infection. The lowest concentration of SARS-CoV-2 viral copies this assay can detect is 138 copies/mL. A negative result does not preclude SARS-Cov-2 infection and should not be used as the sole basis for treatment or other patient management decisions. A negative result may occur with  improper specimen collection/handling, submission of specimen other than nasopharyngeal swab, presence of viral mutation(s) within the areas targeted by this assay, and inadequate number of viral copies(<138 copies/mL). A negative result must be combined with clinical observations, patient history, and epidemiological information. The expected result is  Negative.  Fact Sheet for Patients:  EntrepreneurPulse.com.au  Fact Sheet for Healthcare Providers:  IncredibleEmployment.be  This test is no t yet approved or cleared by the [place] FDA and  has been authorized for detection and/or diagnosis of SARS-CoV-2 by FDA under an Emergency Use Authorization (EUA). This EUA will remain  in effect (meaning this test can be used) for the duration of the COVID-19 declaration under Section 564(b)(1) of the Act, 21 U.S.C.section 360bbb-3(b)(1), unless the authorization is terminated  or revoked sooner.       Influenza A by PCR NEGATIVE NEGATIVE Final   Influenza B by PCR NEGATIVE NEGATIVE Final    Comment: (NOTE) The Xpert Xpress SARS-CoV-2/FLU/RSV plus assay is intended as an aid in the diagnosis of influenza from Nasopharyngeal swab specimens and should not be used as a sole basis for treatment. Nasal washings and aspirates are unacceptable for Xpert Xpress SARS-CoV-2/FLU/RSV testing.  Fact Sheet for Patients: EntrepreneurPulse.com.au  Fact Sheet for Healthcare Providers: IncredibleEmployment.be  This test is not yet approved or cleared by the [place] FDA and has  been authorized for detection and/or diagnosis of SARS-CoV-2 by FDA under an Emergency Use Authorization (EUA). This EUA will remain in effect (meaning this test can be used) for the duration of the COVID-19 declaration under Section 564(b)(1) of the Act, 21 U.S.C. section 360bbb-3(b)(1), unless the authorization is terminated or revoked.  Performed at [facility], [place] [address], [place], [place] [address]      Discharge Instructions:   Discharge Instructions     Discharge instructions   Complete by: As directed    Renal/carb mod   Increase activity slowly   Complete by: As directed       Allergies as of 10/14/2022   No Known Allergies      Medication List     STOP  taking these medications    losartan 50 MG tablet Commonly known as: Cozaar   spironolactone 25 MG tablet Commonly known as: ALDACTONE       TAKE these medications    amLODipine 10 MG tablet Commonly known as: NORVASC Take 1 tablet (10 mg total) by mouth at bedtime.   atorvastatin 20 MG tablet Commonly known as: LIPITOR Take 1 tablet (20 mg total) by mouth daily.   azelastine 0.1 % nasal spray Commonly known as: ASTELIN Place 2 sprays into both nostrils 2 (two) times daily. Use in each nostril as directed   carvedilol 25 MG tablet Commonly known as: COREG Take 1 tablet (25 mg total) by mouth 2 (two) times daily.   cloNIDine 0.3 MG tablet Commonly known as: CATAPRES Take 1 tablet (0.3 mg total) by mouth 3 (three) times daily. What changed:  medication strength how much to take when to take this   DULoxetine 30 MG capsule Commonly known as: CYMBALTA Take 1 capsule (30 mg total) by mouth daily. Start taking on: October 15, 2022 What changed:  medication strength how much to take   famotidine 20 MG tablet Commonly known as: PEPCID Take 1 tablet (20 mg total) by mouth at bedtime.   fluticasone 50 MCG/ACT nasal spray Commonly known as: FLONASE Place 2 sprays into both nostrils daily.   FreeStyle Libre 14 Day Sensor Misc 1 Units by Does not apply route every 14 (fourteen) days.   guaiFENesin 100 MG/5ML liquid Commonly known as: ROBITUSSIN Take 5 mLs by mouth every 4 (four) hours as needed for cough or to loosen phlegm.   hydrALAZINE 100 MG tablet Commonly known as: APRESOLINE Take 1 tablet (100 mg total) by mouth 3 (three) times daily.   Insulin Pen Needle 32G X 4 MM Misc Use to inject insulin up to 4 times daily as directed   levocetirizine 5 MG tablet Commonly known as: XYZAL Take 1 tablet (5 mg total) by mouth every evening.   OneTouch Verio test strip Generic drug: glucose blood Use as instructed   pantoprazole 40 MG tablet Commonly known as:  Protonix Take 1 tablet (40 mg total) by mouth 2 (two) times daily. Take twice a day for 1 month, then go back to daily What changed:  when to take this reasons to take this additional instructions   promethazine 25 MG tablet Commonly known as: PHENERGAN Take 1 tablet (25 mg total) by mouth every 6 (six) hours as needed for nausea or vomiting.   sodium bicarbonate 650 MG tablet Take 1 tablet (650 mg total) by mouth 2 (two) times daily.   sodium polystyrene 15 GM/60ML suspension Commonly known as: KAYEXALATE Take 60 mLs (15 g total) by mouth daily.  traZODone 50 MG tablet Commonly known as: DESYREL Take 1 tablet (50 mg total) by mouth at bedtime.   Tyler Aas FlexTouch 200 UNIT/ML FlexTouch Pen Generic drug: insulin degludec Inject 18 Units into the skin 2 (two) times daily. What changed: how much to take        Follow-up Information     Nche, Charlene Brooke, NP Follow up in 1 week(s).   Specialty: Internal Medicine Contact information: [place] [id number] [phone]         Madelon Lips, MD Follow up.   Specialty: Nephrology Contact information: [address] [place] [address] [phone]                  Time coordinating discharge: 45 min  Signed:  Geradine Girt DO  [facility] 10/14/2022, 3:39 PM

## 2022-10-14 NOTE — Progress Notes (Signed)
Discharge instructions given to pt. Pt verbalized understanding of all teaching and had no further questions. Patient currently waiting in room with family for discharge medications to be delivered to room.

## 2022-10-16 DIAGNOSIS — J45909 Unspecified asthma, uncomplicated: Secondary | ICD-10-CM | POA: Diagnosis not present

## 2022-10-16 DIAGNOSIS — J9600 Acute respiratory failure, unspecified whether with hypoxia or hypercapnia: Secondary | ICD-10-CM | POA: Diagnosis not present

## 2022-10-17 ENCOUNTER — Telehealth: Payer: Self-pay

## 2022-10-17 DIAGNOSIS — Z930 Tracheostomy status: Secondary | ICD-10-CM | POA: Diagnosis not present

## 2022-10-17 NOTE — Telephone Encounter (Signed)
Transition Care Management Unsuccessful Follow-up Telephone Call  Date of discharge and from where:  [facility] 10/14/2022  Attempts:  1st Attempt  Reason for unsuccessful TCM follow-up call:  Left voice message Juanda Crumble, [place] Direct Dial [phone]

## 2022-10-18 NOTE — Telephone Encounter (Unsigned)
Transition Care Management Unsuccessful Follow-up Telephone Call  Date of discharge and from where:  [facility] 10/14/2022  Attempts:  2nd Attempt  Reason for unsuccessful TCM follow-up call:  Left voice message Juanda Crumble, LPN [place] Direct Dial [phone]    ATTEMPT 3   Transition Care Management Follow-up Telephone Call Date of discharge and from where: [facility] 10/14/2022 How have you been since you were released from the hospital? weak Any questions or concerns? No  Items Reviewed: Did the pt receive and understand the discharge instructions provided? Yes  Medications obtained and verified? Yes  Other? No  Any new allergies since your discharge? No  Dietary orders reviewed? Yes Do you have support at home? Yes   Home Care and Equipment/Supplies: Were home health services ordered? no If so, what is the name of the agency? N/a  Has the agency set up a time to come to the patient's home? not applicable Were any new equipment or medical supplies ordered?  No What is the name of the medical supply agency? N/a Were you able to get the supplies/equipment? no Do you have any questions related to the use of the equipment or supplies? No  Functional Questionnaire: (I = Independent and D = Dependent) ADLs: I  Bathing/Dressing- I  Meal Prep- D  Eating- I  Maintaining continence- I  Transferring/Ambulation- D  Managing Meds- I  Follow up appointments reviewed:  PCP Hospital f/u appt confirmed? Yes  Scheduled to see Wilfred Lacy on 10/27/2022 @ 11:00. [place] [facility] f/u appt confirmed? No   Are transportation arrangements needed? No  If their condition worsens, is the pt aware to call PCP or go to the Emergency Dept.? Yes Was the patient provided with contact information for the PCP's office or ED? Yes Was to pt encouraged to call back with questions or concerns? Yes Juanda Crumble, LPN [place] Direct Dial [phone]

## 2022-10-27 ENCOUNTER — Telehealth: Payer: Self-pay

## 2022-10-27 ENCOUNTER — Encounter: Payer: Self-pay | Admitting: Nurse Practitioner

## 2022-10-27 ENCOUNTER — Ambulatory Visit (INDEPENDENT_AMBULATORY_CARE_PROVIDER_SITE_OTHER): Payer: Medicare Other | Admitting: Nurse Practitioner

## 2022-10-27 VITALS — BP 146/76 | HR 74 | Temp 97.2°F | Ht 66.0 in | Wt 243.8 lb

## 2022-10-27 DIAGNOSIS — N185 Chronic kidney disease, stage 5: Secondary | ICD-10-CM

## 2022-10-27 DIAGNOSIS — Z794 Long term (current) use of insulin: Secondary | ICD-10-CM

## 2022-10-27 DIAGNOSIS — E1142 Type 2 diabetes mellitus with diabetic polyneuropathy: Secondary | ICD-10-CM

## 2022-10-27 DIAGNOSIS — R112 Nausea with vomiting, unspecified: Secondary | ICD-10-CM

## 2022-10-27 DIAGNOSIS — I1A0 Resistant hypertension: Secondary | ICD-10-CM | POA: Diagnosis not present

## 2022-10-27 LAB — BASIC METABOLIC PANEL
BUN: 50 mg/dL — ABNORMAL HIGH (ref 6–23)
CO2: 26 mEq/L (ref 19–32)
Calcium: 7.5 mg/dL — ABNORMAL LOW (ref 8.4–10.5)
Chloride: 105 mEq/L (ref 96–112)
Creatinine, Ser: 6.3 mg/dL (ref 0.40–1.20)
GFR: 6.98 mL/min — CL (ref >60.00)
Glucose, Bld: 208 mg/dL — ABNORMAL HIGH (ref 70–99)
Potassium: 3.6 mEq/L (ref 3.5–5.1)
Sodium: 142 mEq/L (ref 135–145)

## 2022-10-27 LAB — MICROALBUMIN / CREATININE URINE RATIO
Creatinine,U: 80.1 mg/dL
Microalb Creat Ratio: 417.6 mg/g — ABNORMAL HIGH (ref 0.0–30.0)
Microalb, Ur: 334.3 mg/dL — ABNORMAL HIGH (ref 0.0–1.9)

## 2022-10-27 MED ORDER — PROMETHAZINE HCL 25 MG PO TABS: 25.0000 mg | ORAL_TABLET | Freq: Three times a day (TID) | ORAL | 0 refills | Status: AC | PRN

## 2022-10-27 NOTE — Telephone Encounter (Signed)
[facility]  Caller: Hope   Receiver: Jersey L.   Time: 4:25  Critical Value:   Creatinine 6.03  GFR 6.98

## 2022-10-27 NOTE — Telephone Encounter (Signed)
Critical Creatinine at 6.03 and GFR 6.98 [place] PCP. Please advsie

## 2022-10-27 NOTE — Progress Notes (Signed)
Established Patient Visit  Patient: Maureen Jones   DOB: Feb 28, 1966   56 y.o. Female  MRN: [id number] Visit Date: 11/10/2022  Subjective:    Chief Complaint  Patient presents with   [facility] f/u  C/o excess fluids, making it hard for her to walk    HPI Hospitalization 10/06/22 to 10/14/2022 She presented with nausea and vomiting. This was possible due to acute on chronic renal insufficiency. Reviewed labs results and radiology report from hospitalization. We reconciled medication list today. Today she reports persistent nausea and LE Edema. States she compliant with low sodium diet and current medications. She has f/up appt with nephrology-Elizabeth Hollie Salk, 11/03/2022 She has not scheduled f/up appt with endocrinologist. She has appt with GI 11/23/2022.  Wt Readings from Last 3 Encounters:  10/27/22 243 lb 12.8 oz (110.6 kg)  10/13/22 246 lb 7.6 oz (111.8 kg)  07/22/22 225 lb 9.6 oz (102.3 kg)    BP Readings from Last 3 Encounters:  10/27/22 (!) 146/76  10/14/22 (!) 152/72  07/22/22 (!) 180/92    Reviewed medical, surgical, and social history today  Medications: Outpatient Medications Prior to Visit  Medication Sig   amLODipine (NORVASC) 10 MG tablet Take 1 tablet (10 mg total) by mouth at bedtime.   atorvastatin (LIPITOR) 20 MG tablet Take 1 tablet (20 mg total) by mouth daily.   azelastine (ASTELIN) 0.1 % nasal spray Place 2 sprays into both nostrils 2 (two) times daily. Use in each nostril as directed   carvedilol (COREG) 25 MG tablet Take 1 tablet (25 mg total) by mouth 2 (two) times daily.   Continuous Blood Gluc Sensor (FREESTYLE LIBRE 14 DAY SENSOR) MISC 1 Units by Does not apply route every 14 (fourteen) days.   fluticasone (FLONASE) 50 MCG/ACT nasal spray Place 2 sprays into both nostrils daily.   glucose blood (ONETOUCH VERIO) test strip Use as instructed   hydrALAZINE (APRESOLINE) 100 MG tablet Take 1 tablet (100 mg total) by  mouth 3 (three) times daily.   Insulin Pen Needle 32G X 4 MM MISC Use to inject insulin up to 4 times daily as directed   levocetirizine (XYZAL) 5 MG tablet Take 1 tablet (5 mg total) by mouth every evening.   sodium polystyrene (KAYEXALATE) 15 GM/60ML suspension Take 60 mLs (15 g total) by mouth daily.   traZODone (DESYREL) 50 MG tablet Take 1 tablet (50 mg total) by mouth at bedtime.   TRESIBA FLEXTOUCH 200 UNIT/ML FlexTouch Pen Inject 18 Units into the skin 2 (two) times daily. (Patient taking differently: Inject 32 Units into the skin 2 (two) times daily.)   [DISCONTINUED] cloNIDine (CATAPRES) 0.3 MG tablet Take 1 tablet (0.3 mg total) by mouth 3 (three) times daily.   [DISCONTINUED] DULoxetine (CYMBALTA) 30 MG capsule Take 1 capsule (30 mg total) by mouth daily.   [DISCONTINUED] famotidine (PEPCID) 20 MG tablet Take 1 tablet (20 mg total) by mouth at bedtime.   [DISCONTINUED] sodium bicarbonate 650 MG tablet Take 1 tablet (650 mg total) by mouth 2 (two) times daily.   guaiFENesin (ROBITUSSIN) 100 MG/5ML liquid Take 5 mLs by mouth every 4 (four) hours as needed for cough or to loosen phlegm. (Patient not taking: Reported on 10/19/2022)   pantoprazole (PROTONIX) 40 MG tablet Take 1 tablet (40 mg total) by mouth 2 (two) times daily. Take twice a day for 1 month, then go back to daily (  Patient taking differently: Take 40 mg by mouth daily as needed (GERD).)   [DISCONTINUED] promethazine (PHENERGAN) 25 MG tablet Take 1 tablet (25 mg total) by mouth every 6 (six) hours as needed for nausea or vomiting. (Patient not taking: Reported on 10/19/2022)   No facility-administered medications prior to visit.   Reviewed past medical and social history.   ROS per HPI above  Last CBC Lab Results  Component Value Date   WBC 10.4 10/14/2022   HGB 8.0 (L) 10/14/2022   HCT 25.6 (L) 10/14/2022   MCV 93.1 10/14/2022   MCH 29.1 10/14/2022   RDW 13.3 10/14/2022   PLT 222 75/64/3329   Last metabolic  panel Lab Results  Component Value Date   GLUCOSE 208 (H) 10/27/2022   NA 142 10/27/2022   K 3.6 10/27/2022   CL 105 10/27/2022   CO2 26 10/27/2022   BUN 50 (H) 10/27/2022   CREATININE 6.30 (HH) 10/27/2022   GFRNONAA 6 (L) 10/14/2022   CALCIUM 7.5 (L) 10/27/2022   PHOS 4.3 10/13/2022   PROT 7.1 10/05/2022   ALBUMIN 2.9 (L) 10/13/2022   BILITOT 0.7 10/05/2022   ALKPHOS 114 10/05/2022   AST 30 10/05/2022   ALT 17 10/05/2022   ANIONGAP 13 10/14/2022      Objective:  BP (!) 146/76 (BP Location: Right Arm, Patient Position: Sitting, Cuff Size: Normal)   Pulse 74   Temp (!) 97.2 F (36.2 C) (Temporal)   Ht 5\' 6"  (1.676 m)   Wt 243 lb 12.8 oz (110.6 kg)   SpO2 96%   BMI 39.35 kg/m      Physical Exam Cardiovascular:     Rate and Rhythm: Normal rate and regular rhythm.     Pulses: Normal pulses.     Heart sounds: Normal heart sounds.  Pulmonary:     Effort: Pulmonary effort is normal.     Breath sounds: Normal breath sounds.  Musculoskeletal:     Right lower leg: Edema present.     Left lower leg: Edema present.  Neurological:     Mental Status: She is alert and oriented to person, place, and time.     Results for orders placed or performed in visit on 51/88/41  Basic metabolic panel  Result Value Ref Range   Sodium 142 135 - 145 mEq/L   Potassium 3.6 3.5 - 5.1 mEq/L   Chloride 105 96 - 112 mEq/L   CO2 26 19 - 32 mEq/L   Glucose, Bld 208 (H) 70 - 99 mg/dL   BUN 50 (H) 6 - 23 mg/dL   Creatinine, Ser 6.30 (HH) 0.40 - 1.20 mg/dL   GFR 6.98 (LL) >60.00 mL/min   Calcium 7.5 (L) 8.4 - 10.5 mg/dL  Urine microalbumin-creatinine with uACR  Result Value Ref Range   Microalb, Ur 334.3 (H) 0.0 - 1.9 mg/dL   Creatinine,U 80.1 mg/dL   Microalb Creat Ratio 417.6 (H) 0.0 - 30.0 mg/g      Assessment & Plan:    Problem List Items Addressed This Visit       Digestive   Nausea & vomiting - Primary   Relevant Medications   promethazine (PHENERGAN) 25 MG tablet      Endocrine   DM (diabetes mellitus) (HCC)   Relevant Orders   Urine microalbumin-creatinine with uACR (Completed)     Genitourinary   CKD (chronic kidney disease) stage 5, GFR less than 15 ml/min (HCC)   Relevant Orders   Basic metabolic panel (Completed)   Provided  printed information on kidney disease and recommended diet. Advised to maintain appt with nephrology, GI and schedule one with endocrinology..  Return in about 3 months (around 01/27/2023) for DM, HTN.     Wilfred Lacy, NP

## 2022-10-27 NOTE — Patient Instructions (Addendum)
Go to lab Schedule appt with nephrology Maintain appt with endocrinology.  Food Basics for Chronic Kidney Disease Chronic kidney disease (CKD) is when your kidneys are not working well. They cannot remove waste, fluids, and other substances from your blood the way they should. These substances can build up, which can worsen kidney damage and affect how your body works. Eating certain foods can lead to a buildup of these substances. Changing your diet can help prevent more kidney damage. Diet changes may also delay dialysis or even keep you from needing it. What nutrients should I limit? Work with your treatment team and a food expert (dietitian) to make a meal plan that's right for you. Foods you can eat and foods you should limit or avoid will depend on the stage of your kidney disease and any other health conditions you have. The items listed below are not a complete list. Talk with your dietitian to learn what is best for you. Potassium Potassium affects how steadily your heart beats. Too much potassium in your blood can cause an irregular heartbeat or even a heart attack. You may need to limit foods that are high in potassium, such as: Liquid milk and soy milk. Salt substitutes that contain potassium. Fruits like bananas, apricots, nectarines, melon, prunes, raisins, kiwi, and oranges. Vegetables, such as potatoes, sweet potatoes, yams, tomatoes, leafy greens, beets, avocado, pumpkin, and winter squash. Beans, like lima beans. Nuts. Phosphorus Phosphorus is a mineral found in your bones. You need a balance between calcium and phosphorus to build and maintain healthy bones. Too much added phosphorus from the foods you eat can pull calcium from your bones. Losing calcium can make your bones weak and more likely to break. Too much phosphorus can also make your skin itch. You may need to limit foods that are high in phosphorus or that have added phosphorus, such as: Liquid milk and dairy  products. Dark-colored sodas or soft drinks. Bran cereals and oatmeal. Protein  Protein helps you make and keep muscle. Protein also helps to repair your body's cells and tissues. One of the natural breakdown products of protein is a waste product called urea. When your kidneys are not working well, they cannot remove types of waste like urea. Reducing protein in your diet can help keep urea from building up in your blood. Depending on your stage of kidney disease, you may need to eat smaller portions of foods that are high in protein. Sources of animal protein include: Meat (all types). Fish and seafood. Poultry. Eggs. Dairy. Other protein foods include: Beans and legumes. Nuts and nut butter. Soy, like tofu.  Sodium Salt (sodium) helps to keep a healthy balance of fluids in your body. Too much salt can increase your blood pressure, which can harm your heart and lungs. Extra salt can also cause your body to keep too much fluid, making your kidneys work harder. You may need to limit or avoid foods that are high in salt, such as: Salt seasonings. Soy and teriyaki sauce. Packaged, precooked, cured, or processed meats, such as sausages or meat loaves. Sardines. Salted crackers and snack foods. Fast food. Canned soups and most canned foods. Pickled foods. Vegetable juice. Boxed mixes or ready-to-eat boxed meals and side dishes. Bottled dressings, sauces, and marinades. Talk with your dietitian about how much potassium, phosphorus, protein, and salt you may have each day. Helpful tips Read food labels  Check the amount of salt in foods. Limit foods that have salt or sodium listed among the  first five ingredients. Try to eat low-salt foods. Check the ingredient list for added phosphorus or potassium. "Phos" in an ingredient is a sign that phosphorus has been added. Do not buy foods that are calcium-enriched or that have calcium added to them (are fortified). Buy canned vegetables and  beans that say "no salt added" and rinse them before eating. Lifestyle Limit the amount of protein you eat from animal sources each day. Focus on protein from plant sources, like tofu and dried beans, peas, and lentils. Do not add salt to food when cooking or before eating. Do not eat star fruit. It can be toxic for people with kidney problems. Talk with your health care provider before taking any vitamin or mineral supplements. If told by your health care provider, track how much liquid you drink so you can avoid drinking too much. You may need to include foods you eat that are made mostly from water, like gelatin, ice cream, soups, and juicy fruits and vegetables. If you have diabetes: If you have diabetes (diabetes mellitus) and CKD, you need to keep your blood sugar (glucose) in the target range recommended by your health care provider. Follow your diabetes management plan. This may include: Checking your blood glucose regularly. Taking medicines by mouth, or taking insulin, or both. Exercising for at least 30 minutes on 5 or more days each week, or as told by your health care provider. Tracking how many servings of carbohydrates you eat at each meal. Not using orange juice to treat low blood sugars. Instead, use apple juice, cranberry juice, or clear soda. You may be given guidelines on what foods and nutrients you may eat, and how much you can have each day. This depends on your stage of kidney disease and whether you have high blood pressure (hypertension). Follow the meal plan your dietitian gives you. To learn more: [employer] of Diabetes and Digestive and Kidney Diseases: [employer]: kidney.org Summary Chronic kidney disease (CKD) is when your kidneys are not working well. They cannot remove waste, fluids, and other substances from your blood the way they should. These substances can build up, which can worsen kidney damage and affect how your body  works. Changing your diet can help prevent more kidney damage. Diet changes may also delay dialysis or even keep you from needing it. Diet changes are different for each person with CKD. Work with a dietitian to set up a meal plan that is right for you. This information is not intended to replace advice given to you by your health care provider. Make sure you discuss any questions you have with your health care provider. Document Revised: 03/18/2022 Document Reviewed: 03/23/2020 Elsevier Patient Education  [place].

## 2022-10-28 ENCOUNTER — Encounter: Payer: Self-pay | Admitting: Nurse Practitioner

## 2022-10-28 DIAGNOSIS — J383 Other diseases of vocal cords: Secondary | ICD-10-CM | POA: Diagnosis not present

## 2022-10-28 DIAGNOSIS — J45909 Unspecified asthma, uncomplicated: Secondary | ICD-10-CM | POA: Diagnosis not present

## 2022-11-02 ENCOUNTER — Other Ambulatory Visit: Payer: Self-pay

## 2022-11-02 ENCOUNTER — Encounter: Payer: Self-pay | Admitting: Nurse Practitioner

## 2022-11-02 DIAGNOSIS — K219 Gastro-esophageal reflux disease without esophagitis: Secondary | ICD-10-CM

## 2022-11-02 DIAGNOSIS — Z794 Long term (current) use of insulin: Secondary | ICD-10-CM

## 2022-11-02 MED ORDER — SODIUM BICARBONATE 650 MG PO TABS: 650.0000 mg | ORAL_TABLET | Freq: Two times a day (BID) | ORAL | 2 refills | Status: DC

## 2022-11-02 MED ORDER — DULOXETINE HCL 30 MG PO CPEP: 30.0000 mg | ORAL_CAPSULE | Freq: Every day | ORAL | 3 refills | Status: AC

## 2022-11-02 MED ORDER — FAMOTIDINE 20 MG PO TABS: 20.0000 mg | ORAL_TABLET | Freq: Every day | ORAL | 0 refills | Status: DC

## 2022-11-02 MED ORDER — CLONIDINE HCL 0.3 MG PO TABS: 0.3000 mg | ORAL_TABLET | Freq: Three times a day (TID) | ORAL | 3 refills | Status: AC

## 2022-11-10 ENCOUNTER — Encounter: Payer: Self-pay | Admitting: Nurse Practitioner

## 2022-11-10 MED ORDER — FAMOTIDINE 20 MG PO TABS: 20.0000 mg | ORAL_TABLET | Freq: Every day | ORAL | 1 refills | Status: AC

## 2022-11-11 ENCOUNTER — Ambulatory Visit (HOSPITAL_COMMUNITY)
Admission: RE | Admit: 2022-11-11 | Discharge: 2022-11-11 | Disposition: A | Discharge status: Home / Self Care | Payer: Medicare Other | Source: Ambulatory Visit | Attending: Acute Care | Admitting: Acute Care

## 2022-11-11 DIAGNOSIS — Z930 Tracheostomy status: Secondary | ICD-10-CM | POA: Insufficient documentation

## 2022-11-11 DIAGNOSIS — G4733 Obstructive sleep apnea (adult) (pediatric): Secondary | ICD-10-CM | POA: Diagnosis present

## 2022-11-11 DIAGNOSIS — J386 Stenosis of larynx: Secondary | ICD-10-CM | POA: Diagnosis not present

## 2022-11-11 DIAGNOSIS — J383 Other diseases of vocal cords: Secondary | ICD-10-CM | POA: Diagnosis present

## 2022-11-11 IMAGING — DX DG CHEST 1V PORT
1 series · 1 of 1 positions shown · non-contrast
Comparison: March 31, 2022

CLINICAL DATA: Shortness of breath.

EXAM:
PORTABLE CHEST 1 VIEW

[chest ap]
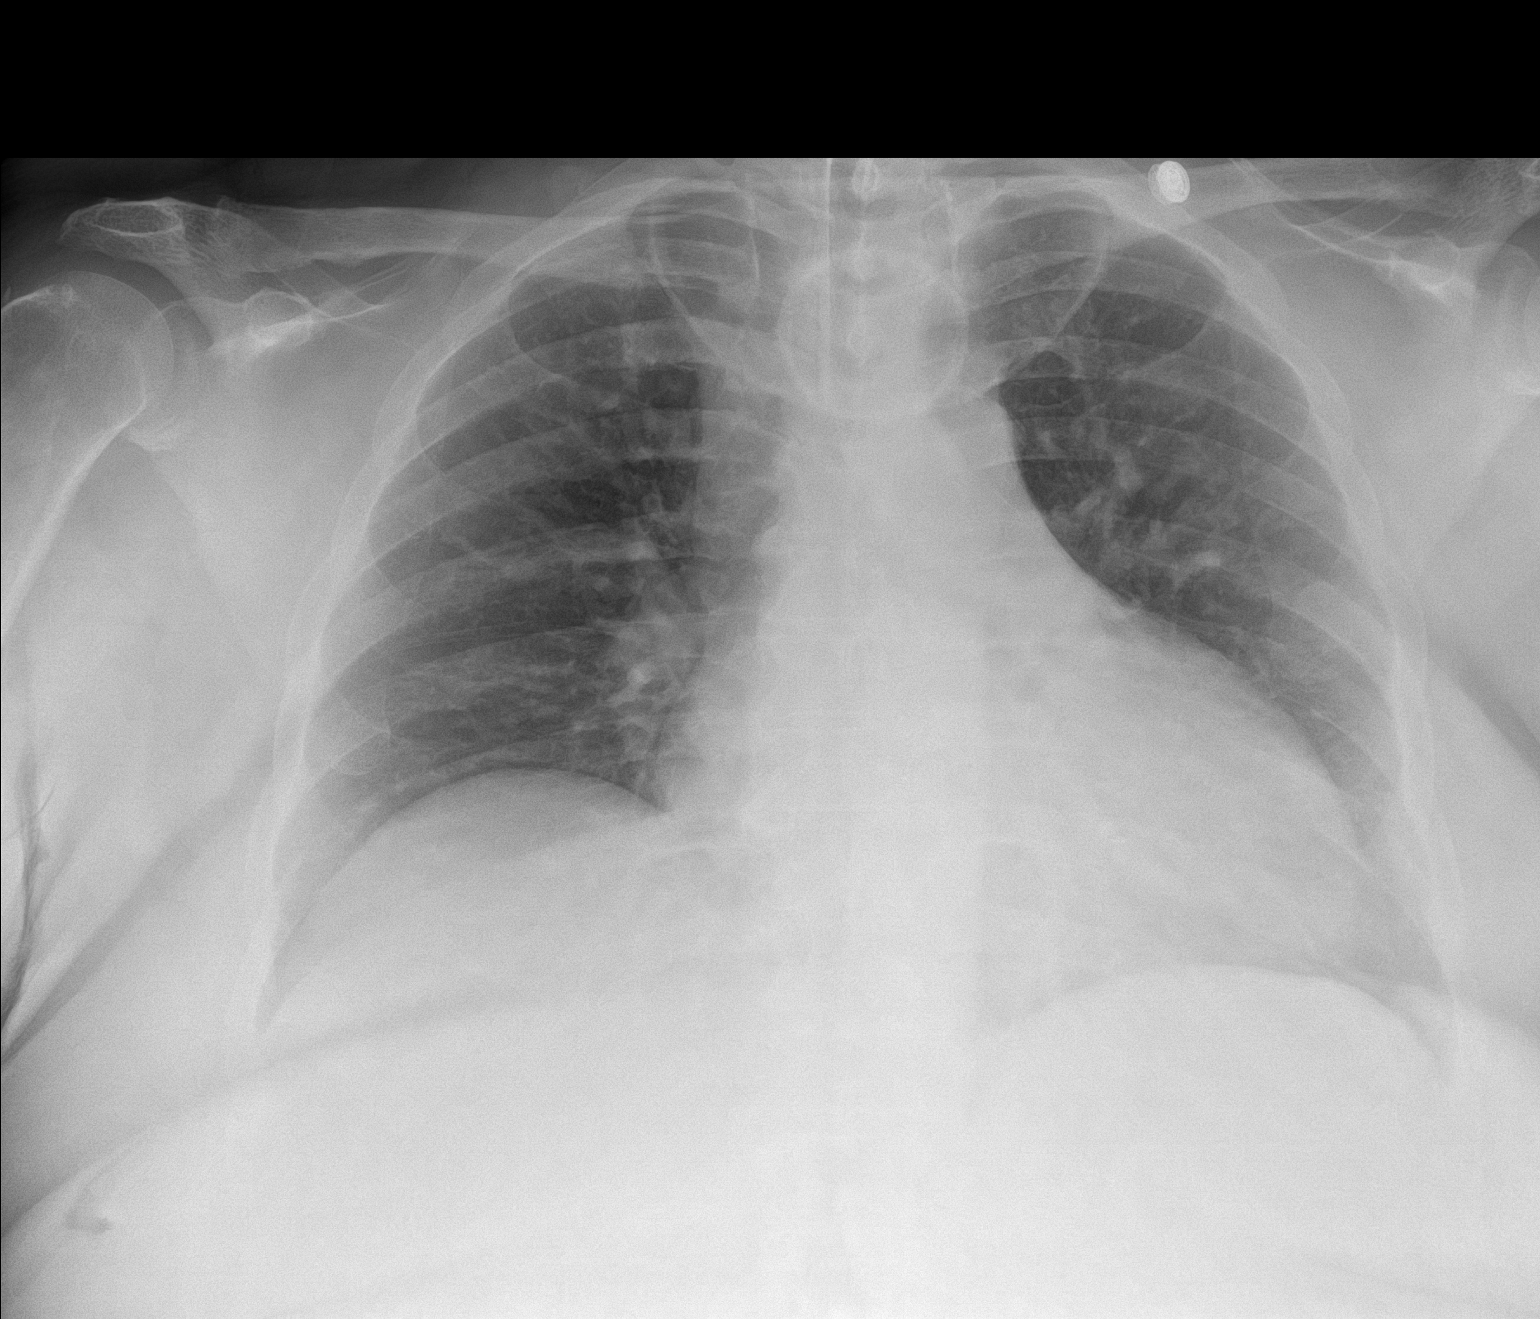

[1 of 1 positions shown; findings below may reference images not displayed]

FINDINGS: A tracheostomy tube is in place. The heart size and mediastinal
contours are within normal limits. Mildly decreased lung volumes are
seen with mild, stable elevation of the right hemidiaphragm. Both
lungs are clear. The visualized skeletal structures are
unremarkable.
IMPRESSION: No active disease.

## 2022-11-11 NOTE — Progress Notes (Signed)
Tracheostomy Procedure Note  Maureen Jones [id number] Oct 16, 1966  Pre Procedure Tracheostomy Information  Trach Brand: Shiley Size:  6.0 XLTUP Style: Uncuffed Secured by: Velcro   Procedure: Trach Change aand Trach Cleaning    Post Procedure Tracheostomy Information  Trach Brand: Shiley Size:  6.0 XLTUP Style: Uncuffed Secured by: Velcro   Post Procedure Evaluation:  ETCO2 positive color change from yellow to purple : Yes.   Vital signs: pulse oximetry 98 % on RA Patients current condition: stable Complications: No apparent complications Trach site exam: clean, dry Wound care done: 4 x 4 gauze drain gauze Patient did tolerate procedure well.   Education: none  Prescription needs: none    Additional needs: none

## 2022-11-11 NOTE — Progress Notes (Signed)
Reason for visit Trach change  HPI Armine presents today for f/u trach care. She has known h/o VCD, I have been following her for trach management. I recently referred her to [facility] ENT w/ concern for Subglottic stenosis. 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 Additionally there was consideration for [facility] laryngoscopy with laser excision. She presents today for trach change  Presents today w/out acute distress. Still awaiting housing to get established in [place].  Review of Systems  All other systems reviewed and are negative.   Exam  General no distress  HENT NCAT no JVD speaks w/ PMV in short phrases. Intolerant of prolonged time w/ cap due to air trapping Pulm clear Card rrr Abd soft Ext warm  Neuro intact   Procedures The 5 xlt was removed. Site inspected and unremarkable. New trach placed over obturator placement verified via ETCO2 pt tolerated well     Active Problems:   Tracheostomy dependence (HCC)   Vocal cord dysfunction   OSA (obstructive sleep apnea)   Subglottic stenosis  Tracheostomy dependence (HCC) Overview: Size 5 prox xlt/ last change 12/1  Discussion Not candidate for decannulation until subglottic stenosis addressed Changing every 4 weeks due to granulation tissue at stoma site Needs ENT eval        My time 20 min   Erick Colace ACNP-BC [place] Pager # [phone] OR # [phone] if no answer

## 2022-11-13 DIAGNOSIS — G4733 Obstructive sleep apnea (adult) (pediatric): Secondary | ICD-10-CM | POA: Diagnosis not present

## 2022-11-14 ENCOUNTER — Other Ambulatory Visit: Payer: Self-pay | Admitting: Nurse Practitioner

## 2022-11-14 DIAGNOSIS — K219 Gastro-esophageal reflux disease without esophagitis: Secondary | ICD-10-CM

## 2022-11-14 MED ORDER — PANTOPRAZOLE SODIUM 40 MG PO TBEC: 40.0000 mg | DELAYED_RELEASE_TABLET | Freq: Every day | ORAL | 0 refills | Status: AC

## 2022-11-14 NOTE — Addendum Note (Signed)
Addended by: Wilfred Lacy L on: 11/14/2022 03:12 PM   Modules accepted: Orders

## 2022-11-15 ENCOUNTER — Ambulatory Visit: Payer: Self-pay | Admitting: Licensed Clinical Social Worker

## 2022-11-15 DIAGNOSIS — J45909 Unspecified asthma, uncomplicated: Secondary | ICD-10-CM | POA: Diagnosis not present

## 2022-11-15 DIAGNOSIS — J9600 Acute respiratory failure, unspecified whether with hypoxia or hypercapnia: Secondary | ICD-10-CM | POA: Diagnosis not present

## 2022-11-15 NOTE — Patient Outreach (Signed)
  Care Coordination   11/15/2022 Name: Maureen Jones MRN: [id number] DOB: August 02, 1966   Care Coordination Outreach Attempts:  An unsuccessful telephone outreach was attempted today to offer the patient information about available care coordination services as a benefit of their health plan.   Follow Up Plan:  No further outreach attempts will be made at this time. We have been unable to contact the patient to offer or enroll patient in care coordination services  Encounter Outcome:  No Answer   Care Coordination Interventions:  Yes, provided    Lenor Derrick, MSW, [place]  Social Worker [facility] Care Management  [phone]

## 2022-11-23 ENCOUNTER — Encounter: Payer: Self-pay | Admitting: Nurse Practitioner

## 2022-11-23 ENCOUNTER — Ambulatory Visit (INDEPENDENT_AMBULATORY_CARE_PROVIDER_SITE_OTHER): Payer: Medicare Other | Admitting: Nurse Practitioner

## 2022-11-23 VITALS — BP 148/76 | HR 85 | Ht 66.0 in | Wt 244.0 lb

## 2022-11-23 DIAGNOSIS — K219 Gastro-esophageal reflux disease without esophagitis: Secondary | ICD-10-CM

## 2022-11-23 DIAGNOSIS — Z1211 Encounter for screening for malignant neoplasm of colon: Secondary | ICD-10-CM

## 2022-11-23 NOTE — Progress Notes (Signed)
Assessment    Patient profile:  Maureen Jones is a 56 y.o. female known to Dr. Carlean Purl with a past medical history of HTN, DM,  obesity, CKD V,  tracheostomy  dependence on 4 L nasal cannula, sleep apnea, history of PEA arrest, vocal cord dysfunction/subglottic stenosis, GERD, chronic anemia  . See PMH /PSH for additional history   # Chronic anemia, likely anemia of chronic disease. No evidence for iron deficiency by October iron studies.   # Chronic GERD. Continues to have some breakthrough heartburn on pantoprazole 40 mg in am and pepcid 20 mg at bedtime but overall symptoms improved compared to when we saw her in the hospital in October.   # Colon cancer screening. No prior screening. No blood in stool. No [place] of colon cancer.   # CKD V. Cr 6. Pitting edema / 14 pound weight gain over last 6 weeks.   # Chronic tracheostomy dependence  Plan   Discussed anti-reflux measures such as avoidance of late meals / bedtime snacks, HOB elevation (or use of wedge pillow), weight reduction ( if applicable)  / maintaining a healthy BMI ( body mass index),  and avoidance of trigger foods and caffeine.  Continue Pantoprazole 40 mg q am 30 minutes prior to breakfast and Pepcid at bedtime   Will hold on scheduling EGD for evaluation of chronic GERD and also a screening colonoscopy since she seems to be volume overloaded. She has pitting edema and reports a 20 pound weight gain over last month of so.  She has a follow up with Nephrology in January. I will see her in February.      HPI    Chief complaint: hospital follow up   Tamika was seen by [place] for hospital consultation 10/07/2022 for evaluation of nausea, vomiting, RLQ discomfort.  She had also had some nonbloody diarrhea which had already resolved. No acute findings on non-contrast CT scan. LFTs, lipase were okay. WBC elevated at 17K. Glucose 250 without evidence for DKA.  Nausea and vomiting felt to be secondary to GERD as her  medications had been changed to as needed prior to discharge .  Advised to stay on twice daily PPI , follow-up outpatient at which time we will discuss possible EGD as well as a screening colonoscopy.  Interval History:   Verina isn't having any nausea / vomiting. Gets breakthrough heartburn about once a week but overall much better. No regurgitation, just heartburn. She reports a 20 pound weight gain over maybe the last month. No SHOB but she tires more easily with the extra weight. She has no [facility] of colon cancer. No blood in stools.   She is tracheostomy dependent. Sleeps with 4 L 02 at night   Labs:     Latest Ref Rng & Units 10/14/2022    2:45 AM 10/13/2022   10:22 AM 10/12/2022    4:01 AM  CBC  WBC 4.0 - 10.5 K/uL 10.4  10.2  10.7   Hemoglobin 12.0 - 15.0 g/dL 8.0  8.4  8.0   Hematocrit 36.0 - 46.0 % 25.6  27.1  25.2   Platelets 150 - 400 K/uL 222  206  183        Latest Ref Rng & Units 10/13/2022   10:22 AM 10/05/2022   10:57 AM 07/04/2022   10:14 AM  Hepatic Function  Total Protein 6.5 - 8.1 g/dL  7.1    Albumin 3.5 - 5.0 g/dL 2.9  3.4  3.4  AST 15 - 41 U/L  30    ALT 0 - 44 U/L  17    Alk Phosphatase 38 - 126 U/L  114    Total Bilirubin 0.3 - 1.2 mg/dL  0.7       Past Medical History:  Diagnosis Date   Acute respiratory failure with hypoxemia (HCC) 12/07/2021   Acute respiratory failure with hypoxia ([place]) 12/07/2021   Asthma    Colitis    Diabetes ([place])    Encephalopathy acute 09/20/2021   Glottic and subglottic edema 2022   Hypertension     Past Surgical History:  Procedure Laterality Date   TRACHEOSTOMY REVISION  12/16/2021   Procedure: TRACHEOSTOMY EXCHANGE;  Surgeon: Juanito Doom, MD;  Location: [facility] ENDOSCOPY;  Service: Cardiopulmonary;;    Current Medications, Allergies, Family History and Social History were reviewed in [place] record.     Current Outpatient Medications  Medication Sig Dispense Refill   amLODipine  (NORVASC) 10 MG tablet Take 1 tablet (10 mg total) by mouth at bedtime. 90 tablet 3   atorvastatin (LIPITOR) 20 MG tablet Take 1 tablet (20 mg total) by mouth daily. 90 tablet 3   azelastine (ASTELIN) 0.1 % nasal spray Place 2 sprays into both nostrils 2 (two) times daily. Use in each nostril as directed     carvedilol (COREG) 25 MG tablet Take 1 tablet (25 mg total) by mouth 2 (two) times daily. 180 tablet 3   cloNIDine (CATAPRES) 0.3 MG tablet Take 1 tablet (0.3 mg total) by mouth 3 (three) times daily. 180 tablet 3   Continuous Blood Gluc Sensor (FREESTYLE LIBRE 14 DAY SENSOR) MISC 1 Units by Does not apply route every 14 (fourteen) days. 2 each 11   DULoxetine (CYMBALTA) 30 MG capsule Take 1 capsule (30 mg total) by mouth daily. 30 capsule 3   famotidine (PEPCID) 20 MG tablet Take 1 tablet (20 mg total) by mouth at bedtime. 90 tablet 1   fluticasone (FLONASE) 50 MCG/ACT nasal spray Place 2 sprays into both nostrils daily. 16 g 5   glucose blood (ONETOUCH VERIO) test strip Use as instructed 100 each 12   hydrALAZINE (APRESOLINE) 100 MG tablet Take 1 tablet (100 mg total) by mouth 3 (three) times daily. 270 tablet 3   Insulin Pen Needle 32G X 4 MM MISC Use to inject insulin up to 4 times daily as directed 100 each 3   levocetirizine (XYZAL) 5 MG tablet Take 1 tablet (5 mg total) by mouth every evening. 90 tablet 3   pantoprazole (PROTONIX) 40 MG tablet Take 1 tablet (40 mg total) by mouth daily. 90 tablet 0   promethazine (PHENERGAN) 25 MG tablet Take 1 tablet (25 mg total) by mouth every 8 (eight) hours as needed for nausea or vomiting. 30 tablet 0   sodium bicarbonate 650 MG tablet Take 1 tablet (650 mg total) by mouth 2 (two) times daily. 60 tablet 2   sodium polystyrene (KAYEXALATE) 15 GM/60ML suspension Take 60 mLs (15 g total) by mouth daily. 946 mL 5   traZODone (DESYREL) 50 MG tablet Take 1 tablet (50 mg total) by mouth at bedtime. 30 tablet 5   TRESIBA FLEXTOUCH 200 UNIT/ML FlexTouch Pen  Inject 18 Units into the skin 2 (two) times daily. (Patient taking differently: Inject 32 Units into the skin 2 (two) times daily.) 15 mL 2   No current facility-administered medications for this visit.    Review of Systems: No chest pain. No shortness  of breath. No urinary complaints.    Physical Exam  Wt Readings from Last 3 Encounters:  10/27/22 243 lb 12.8 oz (110.6 kg)  10/13/22 246 lb 7.6 oz (111.8 kg)  07/22/22 225 lb 9.6 oz (102.3 kg)    BP (!) 148/76   Pulse 85   Ht _0  (1.676 m)   Wt 244 lb (110.7 kg)   BMI 39.38 kg/m  Constitutional:  Generally well appearing female in no acute distress. Psychiatric: Pleasant. Normal mood and affect. Behavior is normal. EENT: Pupils normal.  Conjunctivae are normal. No scleral icterus. Neck supple.  Cardiovascular: Normal rate, regular rhythm.  Pulmonary/chest: has tracheostomy. Effort normal and breath sounds normal. No wheezing, rales or rhonchi. Abdominal: Soft, nondistended, nontender. Bowel sounds active throughout. There are no masses palpable. No hepatomegaly. Neurological: Alert and oriented to person place and time. Extremities:  pitting edema of bilateral lower extremities.  Skin: Skin is warm and dry. No rashes noted.  Tye Savoy, NP  11/23/2022, 8:18 AM  Cc:  Flossie Buffy, NP

## 2022-11-23 NOTE — Patient Instructions (Addendum)
You have been scheduled for an appointment with Maureen Going, NP on 01/23/23 at 900 am . Please arrive 10 minutes early for your appointment.   Acid Reflux  Below are some measures you can take to possibly improve acid reflux symptoms . We may have discussed some of these today in the office. Not everything on this list may apply to you   --If you are taking anti-reflux ( GERD) medication be sure to take it 30 minutes before breakfast and if taking twice daily then also second dose should be 30 minutes before dinner.   --Avoid late meals / bedtime snacks.   --Avoid trigger foods ( foods which you know tend to aggravate you reflux symptoms). Some common trigger foods include spicy foods, fatty foods, acidic foods, chocolate and caffeine.  --Elevate the head of bed 6-8 inches on blocks or bricks. If not able to elevate the head of the bed consider purchasing a wedge pillow to sleep on.    --Weight reduction / maintain a healthy BMI ( body mass index) may be help with reflux symptoms  --Sometimes with the above mentioned "lifestyle changes" patients are able to reduce the amount of GERD medications they take. Our goal is to have you on the lowest effective dose of medication - Continue Pantoprazole 40 mg q am 30 minutes prior to breakfast and Pepcid at bedtime    Due to recent changes in healthcare laws, you may see the results of your imaging and laboratory studies on MyChart before your provider has had a chance to review them.  We understand that in some cases there may be results that are confusing or concerning to you. Not all laboratory results come back in the same time frame and the provider may be waiting for multiple results in order to interpret others.  Please give [place] 48 hours in order for your provider to thoroughly review all the results before contacting the office for clarification of your results.     Thank you for choosing me and [facility].  Maureen Going, NP.

## 2022-11-27 DIAGNOSIS — J45909 Unspecified asthma, uncomplicated: Secondary | ICD-10-CM | POA: Diagnosis not present

## 2022-11-27 DIAGNOSIS — J383 Other diseases of vocal cords: Secondary | ICD-10-CM | POA: Diagnosis not present

## 2022-12-01 ENCOUNTER — Ambulatory Visit (HOSPITAL_COMMUNITY)
Admission: RE | Admit: 2022-12-01 | Discharge: 2022-12-01 | Disposition: A | Discharge status: Home / Self Care | Payer: Medicare Other | Source: Ambulatory Visit | Attending: Acute Care | Admitting: Acute Care

## 2022-12-01 ENCOUNTER — Other Ambulatory Visit: Payer: Self-pay | Admitting: Acute Care

## 2022-12-01 DIAGNOSIS — J386 Stenosis of larynx: Secondary | ICD-10-CM | POA: Diagnosis present

## 2022-12-01 DIAGNOSIS — J0110 Acute frontal sinusitis, unspecified: Secondary | ICD-10-CM | POA: Insufficient documentation

## 2022-12-01 DIAGNOSIS — J383 Other diseases of vocal cords: Secondary | ICD-10-CM | POA: Diagnosis present

## 2022-12-01 DIAGNOSIS — Z930 Tracheostomy status: Secondary | ICD-10-CM | POA: Insufficient documentation

## 2022-12-01 DIAGNOSIS — G4733 Obstructive sleep apnea (adult) (pediatric): Secondary | ICD-10-CM | POA: Diagnosis present

## 2022-12-01 DIAGNOSIS — T85898A Other specified complication of other internal prosthetic devices, implants and grafts, initial encounter: Secondary | ICD-10-CM | POA: Insufficient documentation

## 2022-12-01 DIAGNOSIS — J384 Edema of larynx: Secondary | ICD-10-CM | POA: Diagnosis present

## 2022-12-01 DIAGNOSIS — J329 Chronic sinusitis, unspecified: Secondary | ICD-10-CM | POA: Insufficient documentation

## 2022-12-01 DIAGNOSIS — L8990 Pressure ulcer of unspecified site, unspecified stage: Secondary | ICD-10-CM | POA: Insufficient documentation

## 2022-12-01 MED ORDER — AMOXICILLIN-POT CLAVULANATE 875-125 MG PO TABS: 1.0000 | ORAL_TABLET | Freq: Two times a day (BID) | ORAL | 0 refills | Status: AC

## 2022-12-01 MED ORDER — AMOXICILLIN-POT CLAVULANATE 875-125 MG PO TABS: 1.0000 | ORAL_TABLET | Freq: Two times a day (BID) | ORAL | Status: DC

## 2022-12-01 NOTE — Progress Notes (Signed)
Tracheostomy Procedure Note  Maureen Jones [id number] [birth year]  Pre Procedure Tracheostomy Information  Trach Brand: Shiley Size:  6.0 60XLTUP Style: Uncuffed Secured by: Velcro   Procedure: Trach Change and Trach cleaning    Post Procedure Tracheostomy Information  Trach Brand: Shiley Size:  6.0 60XLTUP Style: Uncuffed Secured by: Velcro   Post Procedure Evaluation:  ETCO2 positive color change from yellow to purple : Yes.   Vital signs:VSS Patients current condition: stable Complications: No apparent complications Trach site exam: clean, dry Wound care done: 4 x 4 gauze drain and wound bandaid  lower part of trach site Patient did tolerate procedure well.   Education: Wound  dressing  Prescription needs: none    Additional needs: none

## 2022-12-01 NOTE — Progress Notes (Signed)
Reason for visit Trach change  HPI Maureen Jones presents today for f/u trach care. She has known h/o VCD, I have been following her for trach management. I recently referred her to [facility] ENT w/ concern for Subglottic stenosis. 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   Review of Systems  Constitutional:  Positive for malaise/fatigue. Negative for chills and fever.  HENT:  Positive for congestion and sinus pain.   Eyes: Negative.   Respiratory:  Positive for cough, hemoptysis and sputum production.   Cardiovascular:  Positive for leg swelling. Negative for chest pain.  Gastrointestinal: Negative.   Genitourinary: Negative.   Musculoskeletal: Negative.   Neurological: Negative.   Endo/Heme/Allergies: Negative.   Psychiatric/Behavioral: Negative.    All other systems reviewed and are negative.   Exam  General no distress  HENT NCAT stoma w/ some moisture associated changes located at 6 o'clock in association to trach  Pulm dec bases  Card rrr Abd soft  Ext warm dependent edema    Procedures The 5 xlt was removed. Site inspected and unremarkable (with the exception of above). New trach placed over obturator placement verified via ETCO2 pt tolerated well     Active Problems:   Subglottic edema   Vocal cord dysfunction   OSA (obstructive sleep apnea)   Subglottic stenosis  Tracheostomy dependence (HCC) Overview: Size 5 prox xlt/ last change 12/21  Discussion Not candidate for decannulation until subglottic stenosis addressed Changing every 4 weeks due to granulation tissue at stoma site Will need ENT  follow when she moves (she will establish)       Acute non-recurrent frontal sinusitis Overview: W/ associated hemoptysis  Plan 7d course of augmentin    Pressure injury due to medical device Overview: Plan Mepilex dressings     My time 20 min   Erick Colace ACNP-BC [place] Pager # [phone] OR # [phone] if no answer

## 2022-12-12 ENCOUNTER — Other Ambulatory Visit: Payer: Self-pay

## 2022-12-12 ENCOUNTER — Emergency Department (HOSPITAL_COMMUNITY): Payer: Medicare Other

## 2022-12-12 ENCOUNTER — Emergency Department (HOSPITAL_COMMUNITY)
Admission: EM | Admit: 2022-12-12 | Discharge: 2022-12-12 | Disposition: A | Discharge status: Home / Self Care | Payer: Medicare Other | Attending: Emergency Medicine | Admitting: Emergency Medicine

## 2022-12-12 DIAGNOSIS — Z7951 Long term (current) use of inhaled steroids: Secondary | ICD-10-CM | POA: Insufficient documentation

## 2022-12-12 DIAGNOSIS — I10 Essential (primary) hypertension: Secondary | ICD-10-CM | POA: Insufficient documentation

## 2022-12-12 DIAGNOSIS — Z79899 Other long term (current) drug therapy: Secondary | ICD-10-CM | POA: Diagnosis not present

## 2022-12-12 DIAGNOSIS — Z794 Long term (current) use of insulin: Secondary | ICD-10-CM | POA: Diagnosis not present

## 2022-12-12 DIAGNOSIS — R9389 Abnormal findings on diagnostic imaging of other specified body structures: Secondary | ICD-10-CM | POA: Diagnosis not present

## 2022-12-12 DIAGNOSIS — R609 Edema, unspecified: Secondary | ICD-10-CM | POA: Diagnosis not present

## 2022-12-12 DIAGNOSIS — E119 Type 2 diabetes mellitus without complications: Secondary | ICD-10-CM | POA: Diagnosis not present

## 2022-12-12 DIAGNOSIS — Z743 Need for continuous supervision: Secondary | ICD-10-CM | POA: Diagnosis not present

## 2022-12-12 DIAGNOSIS — Z1152 Encounter for screening for COVID-19: Secondary | ICD-10-CM | POA: Insufficient documentation

## 2022-12-12 DIAGNOSIS — J45909 Unspecified asthma, uncomplicated: Secondary | ICD-10-CM | POA: Diagnosis not present

## 2022-12-12 DIAGNOSIS — R0602 Shortness of breath: Secondary | ICD-10-CM | POA: Diagnosis not present

## 2022-12-12 DIAGNOSIS — R600 Localized edema: Secondary | ICD-10-CM | POA: Diagnosis not present

## 2022-12-12 DIAGNOSIS — Z7401 Bed confinement status: Secondary | ICD-10-CM | POA: Diagnosis not present

## 2022-12-12 DIAGNOSIS — R6889 Other general symptoms and signs: Secondary | ICD-10-CM | POA: Diagnosis not present

## 2022-12-12 DIAGNOSIS — J9811 Atelectasis: Secondary | ICD-10-CM | POA: Diagnosis not present

## 2022-12-12 DIAGNOSIS — R531 Weakness: Secondary | ICD-10-CM | POA: Diagnosis not present

## 2022-12-12 LAB — CBC WITH DIFFERENTIAL/PLATELET
Abs Immature Granulocytes: 0.07 10*3/uL (ref 0.00–0.07)
Basophils Absolute: 0 10*3/uL (ref 0.0–0.1)
Basophils Relative: 0 %
Eosinophils Absolute: 0.4 10*3/uL (ref 0.0–0.5)
Eosinophils Relative: 3 %
HCT: 26.7 % — ABNORMAL LOW (ref 36.0–46.0)
Hemoglobin: 8.6 g/dL — ABNORMAL LOW (ref 12.0–15.0)
Immature Granulocytes: 1 %
Lymphocytes Relative: 21 %
Lymphs Abs: 2.7 10*3/uL (ref 0.7–4.0)
MCH: 29.4 pg (ref 26.0–34.0)
MCHC: 32.2 g/dL (ref 30.0–36.0)
MCV: 91.1 fL (ref 80.0–100.0)
Monocytes Absolute: 0.8 10*3/uL (ref 0.1–1.0)
Monocytes Relative: 6 %
Neutro Abs: 8.6 10*3/uL — ABNORMAL HIGH (ref 1.7–7.7)
Neutrophils Relative %: 69 %
Platelets: 220 10*3/uL (ref 150–400)
RBC: 2.93 MIL/uL — ABNORMAL LOW (ref 3.87–5.11)
RDW: 13.4 % (ref 11.5–15.5)
WBC: 12.6 10*3/uL — ABNORMAL HIGH (ref 4.0–10.5)
nRBC: 0 % (ref 0.0–0.2)

## 2022-12-12 LAB — URINALYSIS, ROUTINE W REFLEX MICROSCOPIC
Bacteria, UA: NONE SEEN
Bilirubin Urine: NEGATIVE
Glucose, UA: >=500 mg/dL — AB
Ketones, ur: NEGATIVE mg/dL
Leukocytes,Ua: NEGATIVE
Nitrite: NEGATIVE
Protein, ur: >=300 mg/dL — AB
Specific Gravity, Urine: 1.01 (ref 1.005–1.030)
pH: 6 (ref 5.0–8.0)

## 2022-12-12 LAB — BASIC METABOLIC PANEL
Anion gap: 16 — ABNORMAL HIGH (ref 5–15)
BUN: 73 mg/dL — ABNORMAL HIGH (ref 6–20)
CO2: 19 mmol/L — ABNORMAL LOW (ref 22–32)
Calcium: 8.4 mg/dL — ABNORMAL LOW (ref 8.9–10.3)
Chloride: 105 mmol/L (ref 98–111)
Creatinine, Ser: 7.86 mg/dL — ABNORMAL HIGH (ref 0.44–1.00)
GFR, Estimated: 6 mL/min — ABNORMAL LOW (ref >60)
Glucose, Bld: 271 mg/dL — ABNORMAL HIGH (ref 70–99)
Potassium: 4.8 mmol/L (ref 3.5–5.1)
Sodium: 140 mmol/L (ref 135–145)

## 2022-12-12 LAB — RESP PANEL BY RT-PCR (RSV, FLU A&B, COVID)  RVPGX2
Influenza A by PCR: NEGATIVE
Influenza B by PCR: NEGATIVE
Resp Syncytial Virus by PCR: NEGATIVE
SARS Coronavirus 2 by RT PCR: NEGATIVE

## 2022-12-12 LAB — BRAIN NATRIURETIC PEPTIDE: B Natriuretic Peptide: 131.4 pg/mL — ABNORMAL HIGH (ref 0.0–100.0)

## 2022-12-12 MED ORDER — FUROSEMIDE 10 MG/ML IJ SOLN
40.0000 mg | Freq: Once | INTRAMUSCULAR | Status: AC
  Administered 2022-12-12: 40 mg via INTRAVENOUS
  Filled 2022-12-12: qty 4

## 2022-12-12 MED ORDER — HYDRALAZINE HCL 25 MG PO TABS
100.0000 mg | ORAL_TABLET | Freq: Once | ORAL | Status: AC
  Administered 2022-12-12: 100 mg via ORAL
  Filled 2022-12-12: qty 4

## 2022-12-12 MED ORDER — AMLODIPINE BESYLATE 5 MG PO TABS
10.0000 mg | ORAL_TABLET | Freq: Once | ORAL | Status: AC
  Administered 2022-12-12: 10 mg via ORAL
  Filled 2022-12-12: qty 2

## 2022-12-12 NOTE — ED Provider Triage Note (Signed)
Emergency Medicine Provider Triage Evaluation Note  Maureen Jones , a 57 y.o. female  was evaluated in triage.  Pt complains of bilateral lower leg swelling and abdominal swelling.  She states that she has a history of stage V kidney disease.  She has had increasing swelling.  States states that her doctor told her to come to the emergency department 3 days ago, but decided to come this morning when symptoms were worse.  No shortness of breath or fevers.  No cough.  She states that she was given a dose of 80 mg of Lasix a couple of weeks ago, but is not on routinely.  She is urinating normally.  Review of Systems  Positive: Leg swelling, abdominal swelling Negative: Fever, cough  Physical Exam  BP (!) 182/74   Pulse 86   Temp 97.8 F (36.6 C) (Oral)   Resp 18   SpO2 97%  Gen:   Awake, no distress   Resp:  Normal effort, lungs clear MSK:   Moves extremities without difficulty  Other:  3-4+ symmetric edema bilateral lower extremities  Medical Decision Making  Medically screening exam initiated at 1:34 AM.  Appropriate orders placed.  Riely Oetken was informed that the remainder of the evaluation will be completed by another provider, this initial triage assessment does not replace that evaluation, and the importance of remaining in the ED until their evaluation is complete.     Carlisle Cater, PA-C 12/12/22 [id number]

## 2022-12-12 NOTE — Discharge Instructions (Addendum)
Wrap legs or use compression hose- bathe at night, apply hose first thing in the morning upon waking before putting legs out of bed. Limit salt intake. Follow up with your doctor and nephrologist as scheduled. Follow-up with primary care provider for consideration for noncontrast CT scan for nodular density on chest x-ray today.

## 2022-12-12 NOTE — ED Provider Notes (Signed)
[facility] EMERGENCY DEPARTMENT Provider Note   CSN: [id number] Arrival date & time: 12/12/22  0114     History  Chief Complaint  Patient presents with   Lower Legs Edema    Maureen Jones is a 57 y.o. female.  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  Patient is minimally ambulatory at baseline.  She presents today with concern for swelling to her lower extremities which has been present for the past month.  Patient is scheduled to follow-up with nephrology.  In the meantime, called nephrology and took 180 mg dose of Lasix without significant improvement.  She denies any changes in her respiratory status today, fevers, chills, chest pain.       Home Medications Prior to Admission medications   Medication Sig Start Date End Date Taking? Authorizing Provider  amLODipine (NORVASC) 10 MG tablet Take 1 tablet (10 mg total) by mouth at bedtime. 04/01/22   Nche, Charlene Brooke, NP  atorvastatin (LIPITOR) 20 MG tablet Take 1 tablet (20 mg total) by mouth daily. 04/01/22   Nche, Charlene Brooke, NP  azelastine (ASTELIN) 0.1 % nasal spray Place 2 sprays into both nostrils 2 (two) times daily. Use in each nostril as directed    [provider]  carvedilol (COREG) 25 MG tablet Take 1 tablet (25 mg total) by mouth 2 (two) times daily. 04/01/22   Nche, Charlene Brooke, NP  cloNIDine (CATAPRES) 0.3 MG tablet Take 1 tablet (0.3 mg total) by mouth 3 (three) times daily. 11/02/22   Nche, Charlene Brooke, NP  Continuous Blood Gluc Sensor (FREESTYLE LIBRE 14 DAY SENSOR) MISC 1 Units by Does not apply route every 14 (fourteen) days. 07/04/22   Nche, Charlene Brooke, NP  DULoxetine (CYMBALTA) 30 MG capsule Take 1 capsule (30 mg total) by mouth daily. 11/02/22   Nche, Charlene Brooke, NP  famotidine (PEPCID) 20 MG tablet Take 1 tablet (20 mg total) by mouth at bedtime. 11/10/22   Nche, Charlene Brooke, NP   fluticasone (FLONASE) 50 MCG/ACT nasal spray Place 2 sprays into both nostrils daily. 04/12/22   Nche, Charlene Brooke, NP  glucose blood (ONETOUCH VERIO) test strip Use as instructed 04/01/22   Nche, Charlene Brooke, NP  hydrALAZINE (APRESOLINE) 100 MG tablet Take 1 tablet (100 mg total) by mouth 3 (three) times daily. 04/01/22   Nche, Charlene Brooke, NP  Insulin Pen Needle 32G X 4 MM MISC Use to inject insulin up to 4 times daily as directed 04/01/22   Nche, Charlene Brooke, NP  levocetirizine (XYZAL) 5 MG tablet Take 1 tablet (5 mg total) by mouth every evening. 04/12/22   Nche, Charlene Brooke, NP  pantoprazole (PROTONIX) 40 MG tablet Take 1 tablet (40 mg total) by mouth daily. 11/14/22   Nche, Charlene Brooke, NP  promethazine (PHENERGAN) 25 MG tablet Take 1 tablet (25 mg total) by mouth every 8 (eight) hours as needed for nausea or vomiting. 10/27/22   Nche, Charlene Brooke, NP  sodium bicarbonate 650 MG tablet Take 1 tablet (650 mg total) by mouth 2 (two) times daily. 11/02/22   Nche, Charlene Brooke, NP  sodium polystyrene (KAYEXALATE) 15 GM/60ML suspension Take 60 mLs (15 g total) by mouth daily. 10/14/22   Geradine Girt, DO  traZODone (DESYREL) 50 MG tablet Take 1 tablet (50 mg total) by mouth at bedtime. 07/22/22   Nche, Charlene Brooke, NP  TRESIBA FLEXTOUCH 200 UNIT/ML FlexTouch Pen Inject 18 Units into the  skin 2 (two) times daily. Patient taking differently: Inject 32 Units into the skin 2 (two) times daily. 10/14/22   Geradine Girt, DO  insulin aspart (NOVOLOG) 100 UNIT/ML injection Insulin (Novolog) sliding scale for additional Novolog: administer 41minutes before meals. Sugar <150 - no units Sugar >151-200 - 4 units Sugar >200-250 - 6 units Sugar >251-300 - 10 units and call office. Patient taking differently: Inject 4-10 Units into the skin See admin instructions. Insulin (Novolog) sliding scale for additional Novolog: administer 41minutes before meals. Sugar <150 - no units Sugar >151-200 - 4  units Sugar >200-250 - 6 units Sugar >251-300 - 10 units and call office. 11/22/21 12/17/21  Nche, Charlene Brooke, NP      Allergies    Patient has no known allergies.    Review of Systems   Review of Systems Negative except as per HPI Physical Exam Updated Vital Signs BP (!) 189/83   Pulse 92   Temp 98.1 F (36.7 C) (Oral)   Resp 17   SpO2 100%  Physical Exam Vitals and nursing note reviewed.  Constitutional:      General: She is not in acute distress.    Appearance: She is well-developed. She is obese. She is not diaphoretic.  HENT:     Head: Normocephalic and atraumatic.  Cardiovascular:     Rate and Rhythm: Normal rate and regular rhythm.     Heart sounds: Normal heart sounds.  Pulmonary:     Effort: Pulmonary effort is normal.  Musculoskeletal:     Right lower leg: Edema present.     Left lower leg: Edema present.  Skin:    General: Skin is warm and dry.     Findings: No erythema or rash.  Neurological:     Mental Status: She is alert and oriented to person, place, and time.  Psychiatric:        Behavior: Behavior normal.     ED Results / Procedures / Treatments   Labs (all labs ordered are listed, but only abnormal results are displayed) Labs Reviewed  CBC WITH DIFFERENTIAL/PLATELET - Abnormal; Notable for the following components:      Result Value   WBC 12.6 (*)    RBC 2.93 (*)    Hemoglobin 8.6 (*)    HCT 26.7 (*)    Neutro Abs 8.6 (*)    All other components within normal limits  BASIC METABOLIC PANEL - Abnormal; Notable for the following components:   CO2 19 (*)    Glucose, Bld 271 (*)    BUN 73 (*)    Creatinine, Ser 7.86 (*)    Calcium 8.4 (*)    GFR, Estimated 6 (*)    Anion gap 16 (*)    All other components within normal limits  BRAIN NATRIURETIC PEPTIDE - Abnormal; Notable for the following components:   B Natriuretic Peptide 131.4 (*)    All other components within normal limits  URINALYSIS, ROUTINE W REFLEX MICROSCOPIC - Abnormal;  Notable for the following components:   Color, Urine STRAW (*)    Glucose, UA >=500 (*)    Hgb urine dipstick SMALL (*)    Protein, ur >=300 (*)    All other components within normal limits  RESP PANEL BY RT-PCR (RSV, FLU A&B, COVID)  [id number]    EKG None  Radiology DG Chest 2 View  Result Date: 12/12/2022 CLINICAL DATA:  Shortness of breath and peripheral swelling, initial encounter EXAM: CHEST - 2 VIEW COMPARISON:  05/22/2022 FINDINGS:  Tracheostomy tube is noted in satisfactory position. Cardiac shadow is enlarged but stable. Lungs are well aerated bilaterally. Mild atelectatic changes are noted in the left mid lung. A vague nodular density is noted over the anterior aspect of the left third rib measuring approximately 12 mm. No sizable effusion is noted. No bony abnormality is seen. IMPRESSION: Vague nodular density in the left upper lobe. Noncontrast CT would be helpful for further evaluation. Mild left midlung atelectasis. Electronically Signed   By: Inez Catalina M.D.   On: 12/12/2022 01:50    Procedures Procedures    Medications Ordered in ED Medications  furosemide (LASIX) injection 40 mg (40 mg Intravenous Given 12/12/22 1214)  amLODipine (NORVASC) tablet 10 mg (10 mg Oral Given 12/12/22 1214)  hydrALAZINE (APRESOLINE) tablet 100 mg (100 mg Oral Given 12/12/22 1214)    ED Course/ Medical Decision Making/ A&P                           Medical Decision Making Risk Prescription drug management.   This patient presents to the ED for concern of lower extremity edema, this involves an extensive number of treatment options, and is a complaint that carries with it a high risk of complications and morbidity.  The differential diagnosis includes but not limited to metabolic disturbance, renal failure, CHF   Co morbidities that complicate the patient evaluation  Vocal cord dysfunction, tracheostomy dependence, HTN, OSA, DM, CKD   Additional history obtained:  External records from  outside source obtained and reviewed including prior echo on file dated 09/20/2021 with EF 65-70% Discharge summary dated 10/14/2022, admitted for AKI with creatinine of 7.56 up from baseline of 4.0.   Lab Tests:  I Ordered, and personally interpreted labs.  The pertinent results include: CBC with hemoglobin 8.6, similar to prior on file.  Urinalysis with glucose, hemoglobin, protein.  COVID and flu negative, RSV negative.  BNP minimally elevated at 131.  BMP with creatinine of 7.86, on trend with prior, is scheduled to follow-up with nephrology.  Potassium normal.   Imaging Studies ordered:  I ordered imaging studies including chest x-ray I independently visualized and interpreted imaging which showed nodular density, left upper lobe I agree with the radiologist interpretation, refer back to PCP for Noncon CT chest and follow-up   Cardiac Monitoring: / EKG:  The patient was maintained on a cardiac monitor.  I personally viewed and interpreted the cardiac monitored which showed an underlying rhythm of: Normal sinus rhythm, rate 86   Consultations Obtained:  I requested consultation with the ER attending, Dr. Jeanell Sparrow,  and discussed lab and imaging findings as well as pertinent plan - they recommend: Lasix 40 mg IV.  Home BP meds.   Problem List / ED Course / Critical interventions / Medication management  57 year old female with complex medical history with complaint of bilateral lower extremity edema x 1 month, not improved after 1 dose of oral Lasix.  Is provided with IV Lasix, is voiding.  Discussed limiting salt intake, wrapping legs or wearing compression hose.  Follow-up with nephrology as scheduled. I ordered medication including Lasix, Norvasc, hydralazine for edema, hypertension Reevaluation of the patient after these medicines showed that the patient improved I have reviewed the patients home medicines and have made adjustments as needed   Social Determinants of  Health:  Lives with sister who is her caretaker.   Test / Admission - Considered:  No significant changes from prior labs on file,  no respiratory complaints.  Provided with IV Lasix, is voiding.  Blood pressure improved after home BP meds.  Plan is to follow-up with nephrology as scheduled.         Final Clinical Impression(s) / ED Diagnoses Final diagnoses:  Bilateral lower extremity edema  Abnormal chest x-ray    Rx / DC Orders ED Discharge Orders     None         Tacy Learn, PA-C 12/12/22 1458    Pattricia Boss, MD 12/12/22 1550

## 2022-12-12 NOTE — ED Triage Notes (Signed)
Patient arrived with EMS from home reports edema at lower legs for 1 month, denies injury .

## 2022-12-15 ENCOUNTER — Ambulatory Visit: Payer: Medicare Other

## 2022-12-15 ENCOUNTER — Telehealth: Payer: Self-pay

## 2022-12-15 DIAGNOSIS — E1169 Type 2 diabetes mellitus with other specified complication: Secondary | ICD-10-CM

## 2022-12-15 DIAGNOSIS — E1142 Type 2 diabetes mellitus with diabetic polyneuropathy: Secondary | ICD-10-CM

## 2022-12-15 DIAGNOSIS — N185 Chronic kidney disease, stage 5: Secondary | ICD-10-CM

## 2022-12-15 NOTE — Progress Notes (Signed)
Care Management & Coordination Services Pharmacy Note  12/15/2022 Name:  Maureen Jones MRN:  [id number] DOB:  08-Jan-1966  Summary: Patient presents for follow-up consult.   Patient has been sick the past few days. She reports nausea, one episode of vomiting. Her blood sugars have also been variable and patient reports low overall appetite.   She had previously received patient assistance application for Tyler Aas, but mailed it into [employer] without bringing it to her PCP first. Will resend PAP.   Recommendations/Changes made from today's visit: Continue current medications  Follow up plan: CPP follow-up 1 month  Subjective: Maureen Jones is an 57 y.o. year old female who is a primary patient of Nche, Charlene Brooke, NP.  The care coordination team was consulted for assistance with disease management and care coordination needs.    Engaged with patient by telephone for follow up visit.  Patient Care Team: Nche, Charlene Brooke, NP as PCP - General (Internal Medicine) Germaine Pomfret, [facility] (Pharmacist)  Recent office visits: 10/27/22: Patient presented to Wilfred Lacy, NP for follow-up.  07/22/2022 Wilfred Lacy NP (PCP) Start Trazodone 50 mg nightly, Return in about 4 weeks  07/04/2022 Wilfred Lacy NP (PCP) Decrease spironolactone to 12.5 daily, start clonidine 0.37m BID, Amb[facility] ,[facility] to [facility] Diabetes Services, Ambulatory referral to Psychology, Ambulatory referral to Neurology, Ambulatory referral to Podiatry, Return in about 2 weeks , referral to endocrinology 04/05/2022 CWilfred LacyNP (PCP)  start Flonase 50 MCG/ACT 2 spray daily, start levocetirizine (XYZAL) 5 MG tablet daily 04/01/2022 CWilfred LacyNP (PCP) Start potassium chloride 20 MEQ Daily, start spironolactone 25 mg daily, Decrease pantoprazole to 40 mg daily, Return in about 1 month  Recent consult  visits: 08/09/2022 Dr. HSabino GasserMD (otolaryngology) Increase her daily Flonase and Astelin use to twice daily, referral to [facility],  07/14/2022 ALanae CrumblyDPM (Podiatry) Start triamcinolone cream (KENALOG) 0.1 %  Hospital visits: 12/12/22: Patient presented to ED for edema.  10/25-11/3: Patient hospitalized for gastroenteritis  Admitted to the hospital on 05/22/2022 due to Shortness of breath. Discharge date was 05/23/2022. [facility]     Admitted to the hospital on 03/31/2022 due to Tracheostomy complication. [facility] was [facility]31/2022[facility]   Objective:  Lab Results  Component Value Date   CREATININE 7.86 (H) 12/12/2022   BUN 73 (H) 12/12/2022   GFR 6.98 (LL) 10/27/2022   GFRNONAA 6 (L) 12/12/2022   NA 140 12/12/2022   K 4.8 12/12/2022   CALCIUM 8.4 (L) 12/12/2022   CO2 19 (L) 12/12/2022   GLUCOSE 271 (H) 12/12/2022    Lab Results  Component Value Date/Time   HGBA1C 8.5 (H) 07/04/2022 10:14 AM   HGBA1C 8.0 (H) 04/01/2022 11:47 AM   GFR 6.98 (LL) 10/27/2022 12:19 PM   GFR 9.86 (LL) 07/04/2022 10:14 AM   MICROALBUR 334.3 (H) 10/27/2022 12:19 PM    Last diabetic Eye exam: No results found for: "HMDIABEYEEXA"  Last diabetic Foot exam: No results found for: "HMDIABFOOTEX"   Lab Results  Component Value Date   TRIG 212 (H) 10/14/2021       Latest Ref Rng & Units 10/13/2022   10:22 AM 10/05/2022   10:57 AM 07/04/2022   10:14 AM  Hepatic Function  Total Protein 6.5 - 8.1 g/dL  7.1    Albumin 3.5 - 5.0 g/dL 2.9  3.4  3.4   AST  15 - 41 U/L  30    ALT 0 - 44 U/L  17    Alk Phosphatase 38 - 126 U/L  114    Total Bilirubin 0.3 - 1.2 mg/dL  0.7      No results found for: "TSH", "FREET4"     Latest Ref Rng & Units 12/12/2022    2:20 AM 10/14/2022    2:45 AM 10/13/2022   10:22 AM  CBC  WBC 4.0 - 10.5 K/uL 12.6  10.4  10.2   Hemoglobin 12.0 - 15.0 g/dL 8.6  8.0  8.4   Hematocrit  36.0 - 46.0 % 26.7  25.6  27.1   Platelets 150 - 400 K/uL 220  222  206     Lab Results  Component Value Date/Time   VITAMINB12 684 10/21/2021 05:06 AM    Clinical ASCVD: No  The 10-year ASCVD risk score (Arnett DK, et al., 2019) is: 29.2%   Values used to calculate the score:     Age: 57 years     Sex: Female     Is Non-Hispanic African American: Yes     Diabetic: Yes     Tobacco smoker: No     Systolic Blood Pressure: 357 mmHg     Is BP treated: Yes     HDL Cholesterol: 39 MG/DL     Total Cholesterol: 141 MG/DL       07/04/2022   10:17 AM 11/22/2021   11:37 AM  Depression screen PHQ 2/9  Decreased Interest 2 0  Down, Depressed, Hopeless 0 2  PHQ - 2 Score 2 2  Altered sleeping 3 2  Tired, decreased energy 3 2  Change in appetite 3 0  Feeling bad or failure about yourself  0 1  Trouble concentrating 3 0  Moving slowly or fidgety/restless 0 0  Suicidal thoughts 0 0  PHQ-9 Score 14 7  Difficult doing work/chores Somewhat difficult     Social History   Tobacco Use  Smoking Status Former   Types: Cigarettes  Smokeless Tobacco Never   BP Readings from Last 3 Encounters:  12/12/22 (!) 172/80  11/23/22 (!) 148/76  10/27/22 (!) 146/76   Pulse Readings from Last 3 Encounters:  12/12/22 92  11/23/22 85  10/27/22 74   Wt Readings from Last 3 Encounters:  11/23/22 244 lb (110.7 kg)  10/27/22 243 lb 12.8 oz (110.6 kg)  10/13/22 246 lb 7.6 oz (111.8 kg)   BMI Readings from Last 3 Encounters:  11/23/22 39.38 kg/m  10/27/22 39.35 kg/m  10/13/22 39.78 kg/m    No Known Allergies  Medications Reviewed Today     Reviewed by Willia Craze, NP (Nurse Practitioner) on 11/23/22 at 93  Med List Status: <None>   Medication Order Taking? Sig Documenting Provider Last Dose Status Informant  amLODipine (NORVASC) 10 MG tablet [id number] Yes Take 1 tablet (10 mg total) by mouth at bedtime. Nche, Charlene Brooke, NP Taking Active Self  atorvastatin (LIPITOR) 20 MG  tablet [id number] Yes Take 1 tablet (20 mg total) by mouth daily. Nche, Charlene Brooke, NP Taking Active Self  azelastine (ASTELIN) 0.1 % nasal spray [id number] Yes Place 2 sprays into both nostrils 2 (two) times daily. Use in each nostril as directed [provider] Taking Active Self  carvedilol (COREG) 25 MG tablet [id number] Yes Take 1 tablet (25 mg total) by mouth 2 (two) times daily. Flossie Buffy, NP Taking Active Self  cloNIDine (CATAPRES) 0.3 MG tablet [id number] Yes Take  1 tablet (0.3 mg total) by mouth 3 (three) times daily. Nche, Charlene Brooke, NP Taking Active   Continuous Blood Gluc Sensor (FREESTYLE LIBRE 14 DAY SENSOR) [place] [id number] Yes 1 Units by Does not apply route every 14 (fourteen) days. Flossie Buffy, NP Taking Active Self  DULoxetine (CYMBALTA) 30 MG capsule [id number] Yes Take 1 capsule (30 mg total) by mouth daily. Nche, Charlene Brooke, NP Taking Active   famotidine (PEPCID) 20 MG tablet [id number] Yes Take 1 tablet (20 mg total) by mouth at bedtime. Nche, Charlene Brooke, NP Taking Active   fluticasone (FLONASE) 50 MCG/ACT nasal spray [id number] Yes Place 2 sprays into both nostrils daily. Flossie Buffy, NP Taking Active Self  glucose blood (ONETOUCH VERIO) test strip [id number] Yes Use as instructed Nche, Charlene Brooke, NP Taking Active Self  hydrALAZINE (APRESOLINE) 100 MG tablet [id number] Yes Take 1 tablet (100 mg total) by mouth 3 (three) times daily. Nche, Charlene Brooke, NP Taking Active Self   Patient taking differently:   Discontinued 12/17/21 1307 (Reorder) Insulin Pen Needle 32G X 4 MM MISC [id number] Yes Use to inject insulin up to 4 times daily as directed Nche, Charlene Brooke, NP Taking Active Self  levocetirizine (XYZAL) 5 MG tablet [id number] Yes Take 1 tablet (5 mg total) by mouth every evening. Nche, Charlene Brooke, NP Taking Active Self  pantoprazole (PROTONIX) 40 MG tablet [id number] Yes Take 1 tablet (40 mg total) by mouth daily. Nche, Charlene Brooke, NP Taking Active   promethazine (PHENERGAN) 25 MG tablet [id number] Yes Take 1 tablet (25 mg total) by mouth every 8 (eight) hours as needed for nausea or vomiting. Flossie Buffy, NP Taking Active   sodium bicarbonate 650 MG tablet [id number] Yes Take 1 tablet (650 mg total) by mouth 2 (two) times daily. Nche, Charlene Brooke, NP Taking Active   sodium polystyrene (KAYEXALATE) 15 GM/60ML suspension [id number] Yes Take 60 mLs (15 g total) by mouth daily. Geradine Girt, DO Taking Active   traZODone (DESYREL) 50 MG tablet [id number] Yes Take 1 tablet (50 mg total) by mouth at bedtime. Flossie Buffy, NP Taking Active Self  TRESIBA FLEXTOUCH 200 UNIT/ML FlexTouch Pen [id number] Yes Inject 18 Units into the skin 2 (two) times daily.  Patient taking differently: Inject 32 Units into the skin 2 (two) times daily.   Geradine Girt, DO Taking Active             Patient Active Problem List   Diagnosis Date Noted   Sinusitis 12/01/2022   Pressure injury due to medical device 12/01/2022   AKI (acute kidney injury) ([place]) 10/05/2022   LPRD (laryngopharyngeal reflux disease) 09/27/2022   Morbid (severe) obesity due to excess calories ([place]) 07/22/2022   Difficulty with speech 07/05/2022   Tracheostomy status (HCC)    Subglottic stenosis    Nausea & vomiting 02/13/2022   CKD (chronic kidney disease) stage 5, GFR less than 15 ml/min (HCC) 11/22/2021   Mood disorder ([place]) 11/22/2021   Tracheostomy dependence ([place])    Subglottic edema    Vocal cord dysfunction    Endotracheal tube present    Bilateral carpal tunnel syndrome 11/15/2019   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   Insomnia 11/15/2019   Mild intermittent asthma without complication 19/75/8832   OSA (obstructive sleep apnea) 11/15/2019   Seasonal allergic rhinitis 11/15/2019   Tobacco abuse 11/15/2019   DM (diabetes mellitus) ([place]) 11/15/2019     There is no immunization history on file for this  patient.  Compliance/Adherence/Medication fill history: Care Gaps: Ophthalmology Exam Hepatitis C Screening Colonoscopy Mammogram Influenza Vaccine HTN  Star-Rating Drugs: Atorvastatin 20 mg last filled 06/29/2022 [facility]  Losartan 50 mg last filled 06/29/2022 [facility] day supply at [facility].   SDOH:  (Social Determinants of Health) assessments and interventions performed: Yes SDOH Interventions    Flowsheet Row Office Visit from 07/22/2022 in [facility]  SDOH Interventions   Depression Interventions/Treatment  Medication, Counseling      SDOH Screenings   Depression (PHQ2-9): High Risk (07/04/2022)  Tobacco Use: Medium Risk (11/23/2022)    Medication Assistance: Application for [employer]  medication assistance program. in process.  Anticipated assistance start date TBD.  See plan of care for additional detail.  Medication Access: Within the past 30 days, how often has patient missed a dose of medication? None Is a pillbox or other method used to improve adherence? Yes  Factors that may affect medication adherence? financial need and transportation problems Are meds synced by current pharmacy? No  Are meds delivered by current pharmacy? No  Does patient experience delays in picking up medications due to transportation concerns? No   [facility] Services Reviewed: Is patient disadvantaged to use [facility]?: No  Current Rx insurance plan: [facility] Name and location of [facility]  [id number] [place] [address] [address]Phone: [phone]:: [phone] [facility] services reviewed with patient today?: No  Patient requests to transfer care to [facility]?: No  Reason patient declined to change pharmacies: Loyalty to other pharmacy/Patient preference   Assessment/Plan  Hypertension (BP goal <140/90) -Uncontrolled -Current  treatment: Amlodipine 10 mg nightly  Carvedilol 25 mg twice daily  Clonidine 0.3 mg three times daily  Hydralazine 100 mg three times daily  -Medications previously tried: NA  -Current home readings:  -Denies hypotensive/hypertensive symptoms -Recommended to continue current medication  Hyperlipidemia: (LDL goal < 70) -Controlled -Current treatment: Atorvastatin 20 mg daily  -Medications previously tried: NA  -Recommended to continue current medication  Diabetes (A1c goal <8%) -Uncontrolled -Current medications: Tresiba 32 units twice daily  -Medications previously tried: NA  -Current home glucose readings fasting glucose: 69,    Afternoon: 264 -Reports hypoglycemic/hyperglycemic symptoms -Appetite is reduced  -Recommended to continue current medication  Insomnia (Goal: Improve sleep quality) -Uncontrolled -Current treatment  Trazodone 50 mg nightly  -Medications previously tried: NA  -Reports trazodone is ineffective most nights. She only gets 2-3 hours of sleep at most. She also has trouble falling asleep, typically requiring a few hours.  -Recommended to continue current medication  Allergic Rhinitis (Goal: Minimize symptoms) -Uncontrolled -Seasonal  -Current treatment  Asetalin nasal spray  Flonase 2 sprays daily  Levocetirizine 5 mg nightly  -Medications previously tried: Claritin, Pseudofed, Benadryll,  Zyrtec, Zinc  -Recommended to continue current medication  Chronic Kidney Disease Stage 5  -All medications assessed for renal dosing and appropriateness in chronic kidney disease. -Recommended to continue current medication  AJunius Argyle PharmD, BPara March CPP Clinical Pharmacist Practitioner  [facility] [phone]

## 2022-12-15 NOTE — Telephone Encounter (Signed)
Transition Care Management Unsuccessful Follow-up Telephone Call  Date of discharge and from where:  12/12/22 [facility] ED. Dx: Bilateral lower extremity edema  Attempts:  1st Attempt  Reason for unsuccessful TCM follow-up call:  Left voice message

## 2022-12-16 DIAGNOSIS — J9600 Acute respiratory failure, unspecified whether with hypoxia or hypercapnia: Secondary | ICD-10-CM | POA: Diagnosis not present

## 2022-12-16 DIAGNOSIS — J45909 Unspecified asthma, uncomplicated: Secondary | ICD-10-CM | POA: Diagnosis not present

## 2022-12-19 ENCOUNTER — Telehealth: Payer: Self-pay | Admitting: *Deleted

## 2022-12-19 ENCOUNTER — Telehealth: Payer: Self-pay

## 2022-12-19 ENCOUNTER — Ambulatory Visit: Payer: Medicare Other

## 2022-12-19 ENCOUNTER — Other Ambulatory Visit: Payer: Self-pay

## 2022-12-19 DIAGNOSIS — Z789 Other specified health status: Secondary | ICD-10-CM

## 2022-12-19 NOTE — Telephone Encounter (Signed)
This nurse attempted to call patient three times for scheduled AWV. Message left that we will call back to reschedule for another time.

## 2022-12-19 NOTE — Progress Notes (Signed)
  Care Coordination   Note   12/19/2022 Name: Maureen Jones MRN: [id number] DOB: 1966/10/06  Maureen Jones is a 57 y.o. year old female who sees Nche, Charlene Brooke, NP for primary care. I reached out to [name] by phone today to offer care coordination services.  [name] was given information about Care Coordination services today including:   The Care Coordination services include support from the care team which includes your Nurse Coordinator, Clinical Social Worker, or Pharmacist.  The Care Coordination team is here to help remove barriers to the health concerns and goals most important to you. Care Coordination services are voluntary, and the patient may decline or stop services at any time by request to their care team member.   Care Coordination Consent Status: Patient agreed to services and verbal consent obtained.   Follow up plan:  Telephone appointment with care coordination team member scheduled for:  12/20/22  Encounter Outcome:  Pt. Scheduled  [place]  Direct Dial: [phone]

## 2022-12-19 NOTE — Telephone Encounter (Signed)
Transition Care Management Follow-up Telephone Call Date of discharge and from where: 12/12/2022 [facility] ED. Dx: Bilateral lower extremity edema. How have you been since you were released from the hospital? I'm still swollen Any questions or concerns? Yes Pt states she needs a cane for ambulation  Items Reviewed: Did the pt receive and understand the discharge instructions provided? Yes  Medications obtained and verified? No  Other? No  Any new allergies since your discharge? No  Dietary orders reviewed? No Do you have support at home? No    Functional Questionnaire: (I = Independent and D = Dependent) ADLs: I/D  Bathing/Dressing- D  Meal Prep- D  Eating- I  Maintaining continence- I  Transferring/Ambulation- I  Managing Meds- I  Follow up appointments reviewed:  PCP Hospital f/u appt confirmed? Yes  Scheduled to see Wilfred Lacy on 12/23/22 @ 11:20am. [facility] f/u appt confirmed? No  Scheduled to see n/a on n/a @ n/a. Are transportation arrangements needed? No  If their condition worsens, is the pt aware to call PCP or go to the Emergency Dept.? Yes Was the patient provided with contact information for the PCP's office or ED? Yes Was to pt encouraged to call back with questions or concerns? Yes  Angeline Slim, RN, BSN RN Clinical Supervisor [facility] [employer]

## 2022-12-20 ENCOUNTER — Ambulatory Visit: Payer: Self-pay | Admitting: Licensed Clinical Social Worker

## 2022-12-20 ENCOUNTER — Telehealth: Payer: Self-pay | Admitting: Nurse Practitioner

## 2022-12-20 ENCOUNTER — Telehealth: Payer: Self-pay | Admitting: *Deleted

## 2022-12-20 DIAGNOSIS — E1110 Type 2 diabetes mellitus with ketoacidosis without coma: Secondary | ICD-10-CM

## 2022-12-20 DIAGNOSIS — N184 Chronic kidney disease, stage 4 (severe): Secondary | ICD-10-CM

## 2022-12-20 NOTE — Telephone Encounter (Deleted)
   Telephone encounter was:  Unsuccessful.  12/20/2022 Name: Maureen Jones MRN: [id number] DOB: 1966-03-13  Unsuccessful outbound call made today to assist with:  Transportation Needs  and Food Insecurity  Outreach Attempt:  1st Attempt  A HIPAA compliant voice message was left requesting a return call.  Instructed patient to call back at [phone].  [place] [phone] [address] , [place] [address] Email : [email or web address]

## 2022-12-20 NOTE — Patient Outreach (Unsigned)
{  CARE COORDINATION NOTES:27886} 

## 2022-12-20 NOTE — Patient Instructions (Signed)
Visit Information  Thank you for taking time to visit with me today. Please don't hesitate to contact me if I can be of assistance to you before our next scheduled telephone appointment.  Following are the goals we discussed today:   Our next appointment is by telephone on 01/02/23 at 3:30 PM   Please call the care guide team at [phone] if you need to cancel or reschedule your appointment.   If you are experiencing a Mental Health or [place] or need someone to talk to, please go to [facility] [place] [phone])   Following is a copy of your full plan of care:  Interventions: LCSW called client today and spoke with client about program support. LCSW talked with Maureen Jones today about her needs. Client is communicative about her needs and very knowledgeable about her current challenges. Maureen Jones said finances are sometimes difficult since her rent is high ($ 1,345.00 per month). She lives in modular 3 bedroom home with sister Maureen Jones. Maureen Jones is supprotive. Client said she has a few steps at front of her home and she needs a cane to help her walk safely . She has handrail at steps at front of her home. Regarding transport, She has to pay [employer] transport for transport assistance. Regarding food needs, she said sometimes she runs short on food at end of the month Talked about her medical provider care. 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 She said she manages traceheostomy care on her own .  She said she has problems paying her electric bill. She also said that her insulin medications cost her about $ 70.00 per month and this is difficult to pay. She said that sometimes she has to borrow money to get her insulin medication each month.   Discussed income. Maureen Jones said she receives Social Security Disability benefit monthly.  Discussed edema issues. She said she does not have any diurectic at  home to take at present. But, she said she has swelling in her stomach legs,,ankles and feet. She said she has gone to ED before to get lasix for swelling issues.  She said sister, Maureen Jones helps with grocery shopping and cooks sometimes for both of them. Regarding mood , she said she has been sad recently and was sad during holidays. Her mother passed away 21 years ago. Her father lives in [place] , [place].   Discussed program support for client with SW, RN and Pharmacy representative. Provided counseling support to client.  Regarding tracheostomy, she said she feels that she can communicate well while using tracheostomy. LCSW to send referral to Care Guides regarding client resources for food help, utility help, transport help.  LCSW to collaborate with RN Maureen Jones about client needs at present  Maureen Jones was given information about Care Management services by the embedded care coordination team including:  Care Management services include personalized support from designated clinical staff supervised by her physician, including individualized plan of care and coordination with other care providers 24/7 contact phone numbers for assistance for urgent and routine care needs. The patient may stop CCM services at any time (effective at the end of the month) by phone call to the office staff.  Patient agreed to services and verbal consent obtained.   Maureen Jones.Maureen Jones MSW, [place] Holiday representative [facility] Care Management [phone]

## 2022-12-20 NOTE — Patient Outreach (Signed)
Care Coordination   Initial Visit Note   12/20/2022 Name: Maureen Jones MRN: [id number] DOB: [birth year]  [name] is a 57 y.o. year old female who sees Nche, Charlene Brooke, NP for primary care. I spoke with  [name] by phone today.  What matters to the patients health and wellness today? Client said she needs a cane to help with walking. She said she wants to talk with LCSW about depression issues of client    Goals Addressed               This Visit's Progress     Patient wants to talk with LCSW about managing depression issues and symptoms (pt-stated)        Interventions: LCSW called client today and spoke with client about program support. LCSW talked with Jamin today about her needs. Client is communicative about her needs and very knowledgeable about her current challenges. Donnabelle said finances are sometimes difficult since her rent is high ($ 1,345.00 per month). She lives in modular 3 bedroom home with sister [name]. [name] is supprotive. Client said she has a few steps at front of her home and she needs a cane to help her walk safely . She has handrail at steps at front of her home. Regarding transport, She has to pay [employer] transport for transport assistance. Regarding food needs, she said sometimes she runs short on food at end of the month Talked about her medical provider care. 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 She said she manages traceheostomy care on her own .  She said she has problems paying her electric bill. She also said that her insulin medications cost her about $ 70.00 per month and this is difficult to pay. She said that sometimes she has to borrow money to get her insulin medication each month.   Discussed income. Min said she receives Social Security Disability benefit monthly.  Discussed edema issues. She said she does not have any diurectic at home to take at present. But, she said she has swelling  in her stomach legs,,ankles and feet. She said she has gone to ED before to get lasix for swelling issues.  She said sister, [name] helps with grocery shopping and cooks sometimes for both of them. Regarding mood , she said she has been sad recently and was sad during holidays. Her mother passed away 21 years ago. Her father lives in [place] , [place].   Discussed program support for client with SW, RN and Pharmacy representative. Provided counseling support to client.  Regarding tracheostomy, she said she feels that she can communicate well while using tracheostomy. LCSW to collaborate with RN Thea Silversmith about client needs at present     SDOH assessments and interventions completed:  Yes  SDOH Interventions Today    Flowsheet Row Most Recent Value  SDOH Interventions   Depression Interventions/Treatment  Counseling  Physical Activity Interventions Other (Comments)  [walks slowly through her modular home. Said she could benefit from having a cane.]  Stress Interventions Provide Counseling  [client has stress related to managing medical needs. has some stress related to paying rent and paying her monthly bills]        Care Coordination Interventions:  Yes, provided   Follow up plan: Follow up call scheduled for LCSW and client on 01/02/23 at 3:30 PM     Encounter Outcome:  Pt. Visit Completed   Norva Riffle.Jenita Rayfield MSW, [place] Holiday representative [facility] Care Management [phone]

## 2022-12-20 NOTE — Patient Outreach (Signed)
  Care Coordination   Follow Up Visit Note   12/20/2022 Name: Maureen Jones MRN: [id number] DOB: 1966/11/25  Maureen Jones is a 57 y.o. year old female who sees Nche, Charlene Brooke, NP for primary care. I called [place] today and asked for nurse to return call to me to discuss client needs at present.    What matters to the patients health and wellness today?  Patient is requesting a cane to help her walk.  Patient is also wanting to talk with LCSW about depression issues.     Goals Addressed : Patient is asking for a cane to help her walk. Patient wants to talk further with LCSW about depression issues.     LCSW discussed client needs with RN Lazaro Arms today. LCSW informed Lazaro Arms that client was requesting a cane to help her walk. Client reported no recent physical therapy sessions. LCSW informed Lazaro Arms that LCSW had called [facility] [place] to ask for return call from practice nurse to discuss client needs. Wilfred Lacy is NP for medical care for client.    SDOH assessments and interventions completed:  Yes     Care Coordination Interventions:  Yes, provided   Follow up plan: LCSW to call client on 01/02/23 at 3:30 PM to discuss client needs at that time  Encounter Outcome:  Pt. Visit Completed   Norva Riffle.Ananda Sitzer MSW, [place] Holiday representative [facility] Care Management [phone]

## 2022-12-20 NOTE — Telephone Encounter (Signed)
Maureen Jones from [facility] called and his number is [phone]. He would like you to call him . He stated that the pt has a lot of needs

## 2022-12-21 ENCOUNTER — Telehealth: Payer: Self-pay | Admitting: Nurse Practitioner

## 2022-12-21 ENCOUNTER — Telehealth: Payer: Self-pay | Admitting: *Deleted

## 2022-12-21 ENCOUNTER — Ambulatory Visit: Payer: Self-pay | Admitting: Licensed Clinical Social Worker

## 2022-12-21 DIAGNOSIS — D631 Anemia in chronic kidney disease: Secondary | ICD-10-CM | POA: Diagnosis not present

## 2022-12-21 DIAGNOSIS — I120 Hypertensive chronic kidney disease with stage 5 chronic kidney disease or end stage renal disease: Secondary | ICD-10-CM | POA: Diagnosis not present

## 2022-12-21 DIAGNOSIS — N2581 Secondary hyperparathyroidism of renal origin: Secondary | ICD-10-CM | POA: Diagnosis not present

## 2022-12-21 DIAGNOSIS — N185 Chronic kidney disease, stage 5: Secondary | ICD-10-CM | POA: Diagnosis not present

## 2022-12-21 DIAGNOSIS — N189 Chronic kidney disease, unspecified: Secondary | ICD-10-CM | POA: Diagnosis not present

## 2022-12-21 DIAGNOSIS — E1122 Type 2 diabetes mellitus with diabetic chronic kidney disease: Secondary | ICD-10-CM | POA: Diagnosis not present

## 2022-12-21 DIAGNOSIS — G4733 Obstructive sleep apnea (adult) (pediatric): Secondary | ICD-10-CM | POA: Diagnosis not present

## 2022-12-21 DIAGNOSIS — Z930 Tracheostomy status: Secondary | ICD-10-CM | POA: Diagnosis not present

## 2022-12-21 DIAGNOSIS — E785 Hyperlipidemia, unspecified: Secondary | ICD-10-CM | POA: Diagnosis not present

## 2022-12-21 NOTE — Telephone Encounter (Signed)
Katha Cabal, LCSW Licensed Clinical Social Worker Care Coordination   Called and stated that he called yesterday and did not receive a call back about the pt and he calling again today . He stated that the pt is a fall risk pt and a risk he would like to discuss some matters about the pt please give him a call today at [phone]

## 2022-12-21 NOTE — Patient Outreach (Signed)
  Care Coordination   Follow Up Visit Note   12/21/2022 Name: Maureen Jones MRN: [id number] DOB: 01/12/1966  Adri Schloss is a 57 y.o. year old female who sees Nche, Charlene Brooke, NP for primary care. I spoke with representative of [facility] and requested practice RN return call to me at [phone] to discuss client needs.   What matters to the patients health and wellness today? Client said she needs a cane to help her walk. She is interested in talking with LCSW about depression issues    Goals Addressed   Client is at risk for falls. She is requesting cane to help her ambulate   Interventions:   LCSW called [facility] today and spoke with representative at practice.  LCSW had called this practice yesterday and requested return call from RN to discuss client needs. LCSW did not receive return call from practice RN yesterday.  LCSW again requested RN from practice to call LCSW today to discuss client needs. Client is scheduled for appointment with Dr. Wilfred Lacy at practice on 12/23/22.  SDOH assessments and interventions completed:  Yes    Care Coordination Interventions:  Yes, provided   Follow up plan: Follow up call scheduled for LCSW and client on 01/02/23 at 3:30 PM    Encounter Outcome:  Pt. Visit Completed   Norva Riffle.Tyren Dugar MSW, [place] Holiday representative [facility] Care Management [phone]

## 2022-12-21 NOTE — Telephone Encounter (Signed)
   Telephone encounter was:  Unsuccessful.  12/21/2022 Name: Brylie Sneath MRN: [id number] DOB: [birth year]  Unsuccessful outbound call made today to assist with:  Food Insecurity  Outreach Attempt:  2nd Attempt  A HIPAA compliant voice message was left requesting a return call.  Instructed patient to call back at [phone].  [place] [phone] [address] , [place] [address] Email : [email or web address]

## 2022-12-21 NOTE — Telephone Encounter (Signed)
PAPERWORK/FORMS received  Dropped off by: Maureen Jones Call back #: [phone] Individual made aware of 3-5 business day turn around (YES/NO): yes GREEN charge sheet completed and patient made aware of possible charge (YES/NO): yes Placed in provider folder at front desk. ~~~ route to CMA/provider Team  CLINICAL USE BELOW THIS LINE (use X to signify action taken)  ___ Form received and placed in providers office for signature. ___ Form completed and faxed to LOA Dept.  ___ Form completed & LVM to notify patient ready for pick up.  ___ Charge sheet and copy of form in front office folder for office supervisor.

## 2022-12-21 NOTE — Telephone Encounter (Signed)
   CLINICAL USE BELOW THIS LINE (use X to signify action taken)  _X__ Form received and placed in providers office for signature. ___ Form completed and faxed to LOA Dept.  ___ Form completed & LVM to notify patient ready for pick up.  ___ Charge sheet and copy of form in front office folder for office supervisor.  

## 2022-12-23 ENCOUNTER — Telehealth: Payer: Self-pay | Admitting: Nurse Practitioner

## 2022-12-23 ENCOUNTER — Inpatient Hospital Stay: Payer: Medicare Other | Admitting: Nurse Practitioner

## 2022-12-23 NOTE — Telephone Encounter (Signed)
Pt was a no show 1/12 HFU with [facility]. Second

## 2022-12-24 ENCOUNTER — Other Ambulatory Visit: Payer: Self-pay | Admitting: Nurse Practitioner

## 2022-12-24 DIAGNOSIS — E1165 Type 2 diabetes mellitus with hyperglycemia: Secondary | ICD-10-CM

## 2022-12-28 DIAGNOSIS — J45909 Unspecified asthma, uncomplicated: Secondary | ICD-10-CM | POA: Diagnosis not present

## 2022-12-28 DIAGNOSIS — J383 Other diseases of vocal cords: Secondary | ICD-10-CM | POA: Diagnosis not present

## 2022-12-29 ENCOUNTER — Telehealth: Payer: Self-pay | Admitting: *Deleted

## 2022-12-29 NOTE — Telephone Encounter (Signed)
   Telephone encounter was:  Successful.  12/29/2022 Name: Maureen Jones MRN: [id number] DOB: Apr 29, 1966  Maureen Jones is a 57 y.o. year old female who is a primary care patient of Nche, Charlene Brooke, NP . The community resource team was consulted for assistance with Transportation Needs , Food Insecurity, and Financial Difficulties related to utilities and housing   Care guide performed the following interventions: Patient provided with information about care guide support team and interviewed to confirm resource needs. Patient provided information on transportation , [place] access application mailed to patient as well as food banks also put in [place] [facility] referral  Follow Up Plan:  No further follow up planned at this time. The patient has been provided with needed resources.  [place] [phone] [address] , [place] [address] Email : [email or web address]

## 2022-12-29 NOTE — Telephone Encounter (Signed)
No show 01/05/22 and 12/23/22 Fee generated Final warning sent via mail and mychart

## 2022-12-30 DIAGNOSIS — Z930 Tracheostomy status: Secondary | ICD-10-CM | POA: Diagnosis not present

## 2023-01-02 ENCOUNTER — Ambulatory Visit: Payer: Self-pay | Admitting: Licensed Clinical Social Worker

## 2023-01-02 NOTE — Patient Instructions (Signed)
Visit Information  Thank you for taking time to visit with me today. Please don't hesitate to contact me if I can be of assistance to you before our next scheduled telephone appointment.  Following are the goals we discussed today:   Our next appointment is by telephone on 02/06/23 at 1:30 PM   Please call the care guide team at [phone] if you need to cancel or reschedule your appointment.   If you are experiencing a Mental Health or [place] or need someone to talk to, please go to [facility] [place] [phone])   Following is a copy of your full plan of care:   Interventions: LCSW called client today and spoke with client about program support. LCSW talked with Samanthia today about her needs. Client is communicative about her needs and very knowledgeable about her current challenges. Tecia said finances are sometimes difficult since her rent is high ($ 1,345.00 per month). She lives in modular 3 bedroom home with sister [name]. [name] is supprotive. Regarding transport, She has to pay [employer] transport for transport assistance. Care Guide has mailed client application to help with transport support Regarding  recent missed appointment with PCP. Client was a No Show for 12/23/22 appointment with PCP. Client said she did not have money to pay for [employer] transport for her to go to and from 12/23/22 appointment with PCP. LCSW informed client that LCSW had spoken previously with Marcene Brawn, nurse at PCP office about client needs. LCSW encouraged client to call Marcene Brawn at PCP office to talk with nurse about client needs. Client continues to have in home support with sister, [name]. Client said she has not had any recent falls. She said she still does not have a cane to help her walk Encouraged client to call LCSW at [phone]. to address SW needs of client  Ms. Pieri was given information about Care  Management services by the embedded care coordination team including:  Care Management services include personalized support from designated clinical staff supervised by her physician, including individualized plan of care and coordination with other care providers 24/7 contact phone numbers for assistance for urgent and routine care needs. The patient may stop CCM services at any time (effective at the end of the month) by phone call to the office staff.  Patient agreed to services and verbal consent obtained.   Norva Riffle.Jaylend Reiland MSW, [place] Holiday representative [facility] Care Management [phone]

## 2023-01-02 NOTE — Patient Outreach (Signed)
  Care Coordination   Follow Up Visit Note   01/02/2023 Name: Maureen Jones MRN: [id number] DOB: 10-22-1966  Maureen Jones is a 57 y.o. year old female who sees Nche, Maureen Brooke, NP for primary care. I spoke with  Maureen Jones by phone today.  What matters to the patients health and wellness today? Patient wants to talk with LCSW about managing depression issues    Goals Addressed               This Visit's Progress     Patient wants to talk with LCSW about managing depression issues and symptoms (pt-stated)        Interventions: LCSW called client today and spoke with client about program support. LCSW talked with Maureen Jones today about her needs. Client is communicative about her needs and very knowledgeable about her current challenges. Maureen Jones said finances are sometimes difficult since her rent is high ($ 1,345.00 per month). She lives in modular 3 bedroom home with sister Maureen Jones. Maureen Jones is supprotive. Regarding transport, She has to pay [employer] transport for transport assistance. Care Guide has mailed client application to help with transport support Regarding  recent missed appointment with PCP. Client was a No Show for 12/23/22 appointment with PCP. Client said she did not have money to pay for [employer] transport for her to go to and from 12/23/22 appointment with PCP. LCSW informed client that LCSW had spoken previously with Maureen Jones, nurse at PCP office about client needs. LCSW encouraged client to call Maureen Jones at PCP office to talk with nurse about client needs. Client continues to have in home support with sister, Maureen Jones. Client said she has not had any recent falls. She said she still does not have a cane to help her walk Encouraged client to call LCSW at [phone]. to address SW needs of client         SDOH assessments and interventions completed:  Yes  SDOH Interventions Today    Flowsheet Row Most Recent Value  SDOH Interventions    Depression Interventions/Treatment  Counseling  Stress Interventions Provide Counseling  [client has stress related to managing medical needs and managing financial needs]        Care Coordination Interventions:  Yes, provided   Follow up plan: Follow up call scheduled for 02/06/23 at 1:30 PM    Encounter Outcome:  Pt. Visit Completed   Maureen Jones.Maureen Jones MSW, [place] Holiday representative [facility] Care Management [phone]

## 2023-01-03 ENCOUNTER — Telehealth: Payer: Self-pay | Admitting: Nurse Practitioner

## 2023-01-03 NOTE — Telephone Encounter (Signed)
Caller Name: Sheresa Call back phone #: [phone]  Reason for Call: Pt states that she is returning call to [facility] to discuss pain. She is always in pain.

## 2023-01-05 ENCOUNTER — Telehealth: Payer: Self-pay

## 2023-01-05 NOTE — Patient Outreach (Signed)
  Care Coordination   01/05/2023 Name: Brelee Renk MRN: [id number] DOB: Aug 18, 1966   Care Coordination Outreach Attempts:  An unsuccessful telephone outreach was attempted today to offer the patient information about available care coordination services as a benefit of their health plan.   Follow Up Plan:  Additional outreach attempts will be made to offer the patient care coordination information and services.   Encounter Outcome:  No Answer   Care Coordination Interventions:  No, not indicated    Jone Baseman, RN, MSN [place] [facility] Care Management Coordinator Direct Line [phone]

## 2023-01-09 ENCOUNTER — Ambulatory Visit (HOSPITAL_COMMUNITY)
Admission: RE | Admit: 2023-01-09 | Discharge: 2023-01-09 | Disposition: A | Discharge status: Home / Self Care | Payer: Medicare Other | Source: Ambulatory Visit | Attending: Acute Care | Admitting: Acute Care

## 2023-01-09 DIAGNOSIS — J386 Stenosis of larynx: Secondary | ICD-10-CM | POA: Diagnosis not present

## 2023-01-09 DIAGNOSIS — Z930 Tracheostomy status: Secondary | ICD-10-CM

## 2023-01-09 DIAGNOSIS — J383 Other diseases of vocal cords: Secondary | ICD-10-CM | POA: Diagnosis present

## 2023-01-09 DIAGNOSIS — G4733 Obstructive sleep apnea (adult) (pediatric): Secondary | ICD-10-CM | POA: Diagnosis present

## 2023-01-09 NOTE — Progress Notes (Signed)
Reason for visit Trach change  HPI Maureen Jones presents today for f/u trach care. She has known h/o VCD, I have been following her for trach management. I recently referred her to [facility] ENT w/ concern for Subglottic stenosis. 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  Presents today for planned trach change   Review of Systems  Constitutional:  Positive for chills. Negative for fever and malaise/fatigue.  HENT: Negative.    Eyes: Negative.   Respiratory:  Positive for cough.        Reports typical seasonal allergies  Cardiovascular: Negative.   Gastrointestinal: Negative.   Genitourinary: Negative.   Musculoskeletal: Negative.   Skin: Negative.   Neurological: Negative.   Endo/Heme/Allergies: Negative.     Exam  General pleasant 57 year old female patient well-known to me presents for planned tracheostomy change and is in no acute distress HEENT #5 cuffless XLT is in place trach is midline phonation quality hoarse and raspy but unchanged from baseline Pulmonary: Clear to auscultation and without accessory use Cardiac: Regular rate and rhythm Extremities: Warm dry with brisk capillary refill Neuro awake oriented no focal deficits appreciated   Procedures The 5 xlt was removed. Site inspected and was unremarkable. New trach placed. Placement confirmed via ETCO2   Active Problems:   Tracheostomy dependence (HCC)   Vocal cord dysfunction   OSA (obstructive sleep apnea)   Subglottic stenosis  Tracheostomy dependence (HCC) Overview: Size 5 prox xlt/ last change 1/29  Discussion Not candidate for  decannulation until subglottic stenosis addressed.  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

## 2023-01-09 NOTE — Progress Notes (Signed)
Tracheostomy Procedure Note  Maureen Jones [id number] 11-12-1966  Pre Procedure Tracheostomy Information  Trach Brand: Shiley Size:  6.0 6XLTUP Style: Uncuffed Secured by: Velcro   Procedure: Trach Change and Trach Cleaning    Post Procedure Tracheostomy Information  Trach Brand: Shiley Size:  6.0  6XLTUP Style: Uncuffed Secured by: Velcro   Post Procedure Evaluation:  ETCO2 positive color change from yellow to purple : Yes.   Vital signs:VSS Patients current condition: stable Complications: No apparent complications Trach site exam: clean, dry Wound care done: 4 x 4 gauze drain Patient did tolerate procedure well.   Education: none  Prescription needs: none    Additional needs: none

## 2023-01-10 ENCOUNTER — Inpatient Hospital Stay (HOSPITAL_COMMUNITY): Admission: RE | Admit: 2023-01-10 | Payer: Medicare Other | Source: Ambulatory Visit

## 2023-01-10 ENCOUNTER — Encounter (HOSPITAL_COMMUNITY): Payer: Self-pay

## 2023-01-10 NOTE — Telephone Encounter (Signed)
LVM for pt to call the office to schedule an appt with PCP to discuss the pain she is having.

## 2023-01-13 ENCOUNTER — Telehealth: Payer: Self-pay

## 2023-01-13 NOTE — Patient Outreach (Signed)
  Care Coordination   01/13/2023 Name: Maureen Jones MRN: [id number] DOB: 1966/11/14   Care Coordination Outreach Attempts:  A second unsuccessful outreach was attempted today to offer the patient with information about available care coordination services as a benefit of their health plan.     Follow Up Plan:  Additional outreach attempts will be made to offer the patient care coordination information and services.   Encounter Outcome:  No Answer   Care Coordination Interventions:  No, not indicated    Jone Baseman, RN, MSN [place] Management Care Management Coordinator Direct Line [phone]

## 2023-01-16 ENCOUNTER — Telehealth: Payer: Self-pay

## 2023-01-16 ENCOUNTER — Ambulatory Visit: Payer: Medicare Other

## 2023-01-16 DIAGNOSIS — J45909 Unspecified asthma, uncomplicated: Secondary | ICD-10-CM | POA: Diagnosis not present

## 2023-01-16 DIAGNOSIS — J9600 Acute respiratory failure, unspecified whether with hypoxia or hypercapnia: Secondary | ICD-10-CM | POA: Diagnosis not present

## 2023-01-16 NOTE — Telephone Encounter (Signed)
This nurse called patient three times for AWV. Message left that we will call to reschedule for another time.

## 2023-01-18 ENCOUNTER — Inpatient Hospital Stay (HOSPITAL_COMMUNITY): Payer: Medicare Other

## 2023-01-18 ENCOUNTER — Emergency Department (HOSPITAL_COMMUNITY): Payer: Medicare Other

## 2023-01-18 ENCOUNTER — Inpatient Hospital Stay (HOSPITAL_COMMUNITY)
Admission: EM | Admit: 2023-01-18 | Discharge: 2023-01-28 | DRG: 193 | Disposition: A | Discharge status: Home Health | Payer: Medicare Other | Attending: Internal Medicine | Admitting: Internal Medicine

## 2023-01-18 DIAGNOSIS — G4733 Obstructive sleep apnea (adult) (pediatric): Secondary | ICD-10-CM | POA: Diagnosis present

## 2023-01-18 DIAGNOSIS — I120 Hypertensive chronic kidney disease with stage 5 chronic kidney disease or end stage renal disease: Secondary | ICD-10-CM | POA: Diagnosis not present

## 2023-01-18 DIAGNOSIS — Z7401 Bed confinement status: Secondary | ICD-10-CM | POA: Diagnosis not present

## 2023-01-18 DIAGNOSIS — N179 Acute kidney failure, unspecified: Secondary | ICD-10-CM | POA: Diagnosis present

## 2023-01-18 DIAGNOSIS — Z79899 Other long term (current) drug therapy: Secondary | ICD-10-CM | POA: Diagnosis not present

## 2023-01-18 DIAGNOSIS — I5033 Acute on chronic diastolic (congestive) heart failure: Secondary | ICD-10-CM | POA: Diagnosis not present

## 2023-01-18 DIAGNOSIS — Z930 Tracheostomy status: Secondary | ICD-10-CM

## 2023-01-18 DIAGNOSIS — Z1152 Encounter for screening for COVID-19: Secondary | ICD-10-CM

## 2023-01-18 DIAGNOSIS — E8809 Other disorders of plasma-protein metabolism, not elsewhere classified: Secondary | ICD-10-CM | POA: Diagnosis not present

## 2023-01-18 DIAGNOSIS — D631 Anemia in chronic kidney disease: Secondary | ICD-10-CM | POA: Diagnosis present

## 2023-01-18 DIAGNOSIS — E1122 Type 2 diabetes mellitus with diabetic chronic kidney disease: Secondary | ICD-10-CM | POA: Diagnosis not present

## 2023-01-18 DIAGNOSIS — R809 Proteinuria, unspecified: Secondary | ICD-10-CM | POA: Diagnosis present

## 2023-01-18 DIAGNOSIS — J9621 Acute and chronic respiratory failure with hypoxia: Secondary | ICD-10-CM | POA: Diagnosis not present

## 2023-01-18 DIAGNOSIS — Z87441 Personal history of nephrotic syndrome: Secondary | ICD-10-CM

## 2023-01-18 DIAGNOSIS — J13 Pneumonia due to Streptococcus pneumoniae: Secondary | ICD-10-CM | POA: Diagnosis present

## 2023-01-18 DIAGNOSIS — J189 Pneumonia, unspecified organism: Secondary | ICD-10-CM | POA: Diagnosis present

## 2023-01-18 DIAGNOSIS — Z6838 Body mass index (BMI) 38.0-38.9, adult: Secondary | ICD-10-CM

## 2023-01-18 DIAGNOSIS — I1A0 Resistant hypertension: Secondary | ICD-10-CM | POA: Diagnosis present

## 2023-01-18 DIAGNOSIS — R042 Hemoptysis: Secondary | ICD-10-CM | POA: Diagnosis not present

## 2023-01-18 DIAGNOSIS — R059 Cough, unspecified: Secondary | ICD-10-CM | POA: Diagnosis not present

## 2023-01-18 DIAGNOSIS — R531 Weakness: Secondary | ICD-10-CM | POA: Diagnosis not present

## 2023-01-18 DIAGNOSIS — J45909 Unspecified asthma, uncomplicated: Secondary | ICD-10-CM | POA: Diagnosis present

## 2023-01-18 DIAGNOSIS — G459 Transient cerebral ischemic attack, unspecified: Secondary | ICD-10-CM | POA: Diagnosis not present

## 2023-01-18 DIAGNOSIS — E872 Acidosis, unspecified: Secondary | ICD-10-CM | POA: Diagnosis not present

## 2023-01-18 DIAGNOSIS — E785 Hyperlipidemia, unspecified: Secondary | ICD-10-CM | POA: Diagnosis present

## 2023-01-18 DIAGNOSIS — E1140 Type 2 diabetes mellitus with diabetic neuropathy, unspecified: Secondary | ICD-10-CM | POA: Insufficient documentation

## 2023-01-18 DIAGNOSIS — E119 Type 2 diabetes mellitus without complications: Secondary | ICD-10-CM

## 2023-01-18 DIAGNOSIS — E1169 Type 2 diabetes mellitus with other specified complication: Secondary | ICD-10-CM

## 2023-01-18 DIAGNOSIS — Z9981 Dependence on supplemental oxygen: Secondary | ICD-10-CM

## 2023-01-18 DIAGNOSIS — Z978 Presence of other specified devices: Secondary | ICD-10-CM | POA: Diagnosis present

## 2023-01-18 DIAGNOSIS — I10 Essential (primary) hypertension: Secondary | ICD-10-CM | POA: Diagnosis present

## 2023-01-18 DIAGNOSIS — J383 Other diseases of vocal cords: Secondary | ICD-10-CM | POA: Diagnosis present

## 2023-01-18 DIAGNOSIS — D649 Anemia, unspecified: Secondary | ICD-10-CM | POA: Diagnosis not present

## 2023-01-18 DIAGNOSIS — Z794 Long term (current) use of insulin: Secondary | ICD-10-CM

## 2023-01-18 DIAGNOSIS — J9811 Atelectasis: Secondary | ICD-10-CM | POA: Diagnosis not present

## 2023-01-18 DIAGNOSIS — R918 Other nonspecific abnormal finding of lung field: Secondary | ICD-10-CM | POA: Diagnosis not present

## 2023-01-18 DIAGNOSIS — I4510 Unspecified right bundle-branch block: Secondary | ICD-10-CM | POA: Diagnosis not present

## 2023-01-18 DIAGNOSIS — R0602 Shortness of breath: Secondary | ICD-10-CM | POA: Diagnosis not present

## 2023-01-18 DIAGNOSIS — E1165 Type 2 diabetes mellitus with hyperglycemia: Secondary | ICD-10-CM | POA: Diagnosis not present

## 2023-01-18 DIAGNOSIS — E875 Hyperkalemia: Secondary | ICD-10-CM | POA: Diagnosis not present

## 2023-01-18 DIAGNOSIS — Z743 Need for continuous supervision: Secondary | ICD-10-CM | POA: Diagnosis not present

## 2023-01-18 DIAGNOSIS — E8779 Other fluid overload: Secondary | ICD-10-CM | POA: Diagnosis not present

## 2023-01-18 DIAGNOSIS — I132 Hypertensive heart and chronic kidney disease with heart failure and with stage 5 chronic kidney disease, or end stage renal disease: Secondary | ICD-10-CM | POA: Diagnosis present

## 2023-01-18 DIAGNOSIS — R739 Hyperglycemia, unspecified: Secondary | ICD-10-CM | POA: Diagnosis not present

## 2023-01-18 DIAGNOSIS — N185 Chronic kidney disease, stage 5: Secondary | ICD-10-CM | POA: Diagnosis present

## 2023-01-18 DIAGNOSIS — R6889 Other general symptoms and signs: Secondary | ICD-10-CM | POA: Diagnosis not present

## 2023-01-18 DIAGNOSIS — N189 Chronic kidney disease, unspecified: Secondary | ICD-10-CM | POA: Diagnosis not present

## 2023-01-18 DIAGNOSIS — R079 Chest pain, unspecified: Secondary | ICD-10-CM | POA: Diagnosis not present

## 2023-01-18 LAB — BASIC METABOLIC PANEL
Anion gap: 13 (ref 5–15)
BUN: 74 mg/dL — ABNORMAL HIGH (ref 6–20)
CO2: 19 mmol/L — ABNORMAL LOW (ref 22–32)
Calcium: 7.9 mg/dL — ABNORMAL LOW (ref 8.9–10.3)
Chloride: 106 mmol/L (ref 98–111)
Creatinine, Ser: 8.49 mg/dL — ABNORMAL HIGH (ref 0.44–1.00)
GFR, Estimated: 5 mL/min — ABNORMAL LOW (ref >60)
Glucose, Bld: 275 mg/dL — ABNORMAL HIGH (ref 70–99)
Potassium: 4.6 mmol/L (ref 3.5–5.1)
Sodium: 138 mmol/L (ref 135–145)

## 2023-01-18 LAB — IRON AND TIBC
Iron: 26 ug/dL — ABNORMAL LOW (ref 28–170)
Saturation Ratios: 12 % (ref 10.4–31.8)
TIBC: 225 ug/dL — ABNORMAL LOW (ref 250–450)
UIBC: 199 ug/dL

## 2023-01-18 LAB — HEMOGLOBIN A1C
Hgb A1c MFr Bld: 7.6 % — ABNORMAL HIGH (ref 4.8–5.6)
Mean Plasma Glucose: 171.42 mg/dL

## 2023-01-18 LAB — CBC WITH DIFFERENTIAL/PLATELET
Abs Immature Granulocytes: 0.1 10*3/uL — ABNORMAL HIGH (ref 0.00–0.07)
Basophils Absolute: 0.1 10*3/uL (ref 0.0–0.1)
Basophils Relative: 0 %
Eosinophils Absolute: 0.3 10*3/uL (ref 0.0–0.5)
Eosinophils Relative: 2 %
HCT: 21.2 % — ABNORMAL LOW (ref 36.0–46.0)
Hemoglobin: 6.8 g/dL — CL (ref 12.0–15.0)
Immature Granulocytes: 1 %
Lymphocytes Relative: 15 %
Lymphs Abs: 2.3 10*3/uL (ref 0.7–4.0)
MCH: 29.3 pg (ref 26.0–34.0)
MCHC: 32.1 g/dL (ref 30.0–36.0)
MCV: 91.4 fL (ref 80.0–100.0)
Monocytes Absolute: 1.3 10*3/uL — ABNORMAL HIGH (ref 0.1–1.0)
Monocytes Relative: 9 %
Neutro Abs: 11.4 10*3/uL — ABNORMAL HIGH (ref 1.7–7.7)
Neutrophils Relative %: 73 %
Platelets: 298 10*3/uL (ref 150–400)
RBC: 2.32 MIL/uL — ABNORMAL LOW (ref 3.87–5.11)
RDW: 12.9 % (ref 11.5–15.5)
WBC: 15.5 10*3/uL — ABNORMAL HIGH (ref 4.0–10.5)
nRBC: 0 % (ref 0.0–0.2)

## 2023-01-18 LAB — RESP PANEL BY RT-PCR (RSV, FLU A&B, COVID)  RVPGX2
Influenza A by PCR: NEGATIVE
Influenza B by PCR: NEGATIVE
Resp Syncytial Virus by PCR: NEGATIVE
SARS Coronavirus 2 by RT PCR: NEGATIVE

## 2023-01-18 LAB — PREPARE RBC (CROSSMATCH)

## 2023-01-18 LAB — CK: Total CK: 290 U/L — ABNORMAL HIGH (ref 38–234)

## 2023-01-18 LAB — FOLATE: Folate: 12.5 ng/mL (ref >5.9)

## 2023-01-18 LAB — RETICULOCYTES
Immature Retic Fract: 23 % — ABNORMAL HIGH (ref 2.3–15.9)
RBC.: 2.48 MIL/uL — ABNORMAL LOW (ref 3.87–5.11)
Retic Count, Absolute: 62.7 10*3/uL (ref 19.0–186.0)
Retic Ct Pct: 2.5 % (ref 0.4–3.1)

## 2023-01-18 LAB — URIC ACID: Uric Acid, Serum: 8.6 mg/dL — ABNORMAL HIGH (ref 2.5–7.1)

## 2023-01-18 LAB — HEMOGLOBIN AND HEMATOCRIT, BLOOD
HCT: 23.7 % — ABNORMAL LOW (ref 36.0–46.0)
Hemoglobin: 7.3 g/dL — ABNORMAL LOW (ref 12.0–15.0)

## 2023-01-18 LAB — GLUCOSE, CAPILLARY
Glucose-Capillary: 228 mg/dL — ABNORMAL HIGH (ref 70–99)
Glucose-Capillary: 199 mg/dL — ABNORMAL HIGH (ref 70–99)

## 2023-01-18 LAB — VITAMIN B12: Vitamin B-12: 1255 pg/mL — ABNORMAL HIGH (ref 180–914)

## 2023-01-18 LAB — FERRITIN: Ferritin: 284 ng/mL (ref 11–307)

## 2023-01-18 MED ORDER — INSULIN ASPART 100 UNIT/ML IJ SOLN
0.0000 [IU] | Freq: Three times a day (TID) | INTRAMUSCULAR | Status: DC
  Administered 2023-01-18: 3 [IU] via SUBCUTANEOUS
  Administered 2023-01-19 (×2): 2 [IU] via SUBCUTANEOUS
  Administered 2023-01-19 – 2023-01-20 (×2): 3 [IU] via SUBCUTANEOUS
  Administered 2023-01-20: 1 [IU] via SUBCUTANEOUS
  Administered 2023-01-20: 2 [IU] via SUBCUTANEOUS
  Administered 2023-01-21 (×2): 3 [IU] via SUBCUTANEOUS
  Administered 2023-01-21 – 2023-01-22 (×2): 2 [IU] via SUBCUTANEOUS
  Administered 2023-01-22 – 2023-01-25 (×3): 1 [IU] via SUBCUTANEOUS
  Administered 2023-01-25 – 2023-01-26 (×2): 2 [IU] via SUBCUTANEOUS
  Administered 2023-01-26: 1 [IU] via SUBCUTANEOUS
  Administered 2023-01-26: 3 [IU] via SUBCUTANEOUS
  Administered 2023-01-27 (×2): 2 [IU] via SUBCUTANEOUS

## 2023-01-18 MED ORDER — CETIRIZINE HCL 10 MG PO TABS
5.0000 mg | ORAL_TABLET | Freq: Every evening | ORAL | Status: DC
  Administered 2023-01-18 – 2023-01-27 (×10): 5 mg via ORAL
  Filled 2023-01-18 (×12): qty 1

## 2023-01-18 MED ORDER — ATORVASTATIN CALCIUM 10 MG PO TABS
20.0000 mg | ORAL_TABLET | Freq: Every day | ORAL | Status: DC
  Administered 2023-01-18 – 2023-01-28 (×11): 20 mg via ORAL
  Filled 2023-01-18 (×11): qty 2

## 2023-01-18 MED ORDER — SODIUM CHLORIDE 0.9 % IV SOLN: INTRAVENOUS | Status: DC | PRN

## 2023-01-18 MED ORDER — FUROSEMIDE 10 MG/ML IJ SOLN
80.0000 mg | Freq: Two times a day (BID) | INTRAMUSCULAR | Status: DC
  Administered 2023-01-18 – 2023-01-19 (×3): 80 mg via INTRAVENOUS
  Filled 2023-01-18 (×3): qty 8

## 2023-01-18 MED ORDER — FAMOTIDINE 20 MG PO TABS
20.0000 mg | ORAL_TABLET | Freq: Every day | ORAL | Status: DC
  Administered 2023-01-18 – 2023-01-27 (×10): 20 mg via ORAL
  Filled 2023-01-18 (×10): qty 1

## 2023-01-18 MED ORDER — INSULIN DEGLUDEC 200 UNIT/ML ~~LOC~~ SOPN: PEN_INJECTOR | Freq: Two times a day (BID) | SUBCUTANEOUS | Status: DC

## 2023-01-18 MED ORDER — FLUTICASONE PROPIONATE 50 MCG/ACT NA SUSP
2.0000 | Freq: Every day | NASAL | Status: DC
  Administered 2023-01-19 – 2023-01-28 (×10): 2 via NASAL
  Filled 2023-01-18: qty 16

## 2023-01-18 MED ORDER — SODIUM POLYSTYRENE SULFONATE 15 GM/60ML PO SUSP
15.0000 g | Freq: Every day | ORAL | Status: DC
  Administered 2023-01-19 – 2023-01-21 (×3): 15 g via ORAL
  Filled 2023-01-18 (×5): qty 60

## 2023-01-18 MED ORDER — DARBEPOETIN ALFA 60 MCG/0.3ML IJ SOSY
60.0000 ug | PREFILLED_SYRINGE | Freq: Once | INTRAMUSCULAR | Status: AC
  Administered 2023-01-18: 60 ug via SUBCUTANEOUS
  Filled 2023-01-18: qty 0.3

## 2023-01-18 MED ORDER — IPRATROPIUM-ALBUTEROL 0.5-2.5 (3) MG/3ML IN SOLN
3.0000 mL | Freq: Four times a day (QID) | RESPIRATORY_TRACT | Status: DC
  Administered 2023-01-18 – 2023-01-19 (×3): 3 mL via RESPIRATORY_TRACT
  Filled 2023-01-18 (×3): qty 3

## 2023-01-18 MED ORDER — DULOXETINE HCL 30 MG PO CPEP
30.0000 mg | ORAL_CAPSULE | Freq: Every day | ORAL | Status: DC
  Administered 2023-01-18 – 2023-01-28 (×11): 30 mg via ORAL
  Filled 2023-01-18 (×11): qty 1

## 2023-01-18 MED ORDER — AZELASTINE HCL 0.1 % NA SOLN
2.0000 | Freq: Two times a day (BID) | NASAL | Status: DC
  Administered 2023-01-19 – 2023-01-28 (×18): 2 via NASAL
  Filled 2023-01-18: qty 30

## 2023-01-18 MED ORDER — ISOSORBIDE MONONITRATE ER 30 MG PO TB24
30.0000 mg | ORAL_TABLET | Freq: Every day | ORAL | Status: DC
  Administered 2023-01-18 – 2023-01-28 (×11): 30 mg via ORAL
  Filled 2023-01-18 (×11): qty 1

## 2023-01-18 MED ORDER — SODIUM CHLORIDE 0.9 % IV SOLN
500.0000 mg | INTRAVENOUS | Status: AC
  Administered 2023-01-18 – 2023-01-23 (×6): 500 mg via INTRAVENOUS
  Filled 2023-01-18 (×6): qty 5

## 2023-01-18 MED ORDER — GUAIFENESIN ER 600 MG PO TB12
1200.0000 mg | ORAL_TABLET | Freq: Two times a day (BID) | ORAL | Status: DC
  Administered 2023-01-18 – 2023-01-28 (×20): 1200 mg via ORAL
  Filled 2023-01-18 (×20): qty 2

## 2023-01-18 MED ORDER — PROMETHAZINE HCL 25 MG PO TABS
25.0000 mg | ORAL_TABLET | Freq: Three times a day (TID) | ORAL | Status: DC | PRN
  Administered 2023-01-19 – 2023-01-22 (×3): 25 mg via ORAL
  Filled 2023-01-18 (×3): qty 1

## 2023-01-18 MED ORDER — LABETALOL HCL 5 MG/ML IV SOLN: 10.0000 mg | INTRAVENOUS | Status: DC | PRN

## 2023-01-18 MED ORDER — FERROUS SULFATE 325 (65 FE) MG PO TABS
325.0000 mg | ORAL_TABLET | Freq: Every day | ORAL | Status: DC
  Administered 2023-01-19 – 2023-01-28 (×10): 325 mg via ORAL
  Filled 2023-01-18 (×10): qty 1

## 2023-01-18 MED ORDER — CARVEDILOL 12.5 MG PO TABS
25.0000 mg | ORAL_TABLET | Freq: Two times a day (BID) | ORAL | Status: DC
  Administered 2023-01-18 – 2023-01-28 (×20): 25 mg via ORAL
  Filled 2023-01-18 (×21): qty 2

## 2023-01-18 MED ORDER — SODIUM CHLORIDE 0.9 % IV SOLN
2.0000 g | INTRAVENOUS | Status: AC
  Administered 2023-01-18 – 2023-01-23 (×6): 2 g via INTRAVENOUS
  Filled 2023-01-18 (×6): qty 20

## 2023-01-18 MED ORDER — SODIUM BICARBONATE 650 MG PO TABS
650.0000 mg | ORAL_TABLET | Freq: Two times a day (BID) | ORAL | Status: DC
  Administered 2023-01-18 – 2023-01-28 (×20): 650 mg via ORAL
  Filled 2023-01-18 (×20): qty 1

## 2023-01-18 MED ORDER — SODIUM CHLORIDE 0.9% IV SOLUTION: Freq: Once | INTRAVENOUS | Status: AC

## 2023-01-18 MED ORDER — CLONIDINE HCL 0.2 MG PO TABS
0.2000 mg | ORAL_TABLET | Freq: Three times a day (TID) | ORAL | Status: DC
  Administered 2023-01-18 – 2023-01-22 (×12): 0.2 mg via ORAL
  Filled 2023-01-18 (×12): qty 1

## 2023-01-18 MED ORDER — INSULIN GLARGINE-YFGN 100 UNIT/ML ~~LOC~~ SOLN
18.0000 [IU] | Freq: Two times a day (BID) | SUBCUTANEOUS | Status: DC
  Administered 2023-01-18 – 2023-01-21 (×7): 18 [IU] via SUBCUTANEOUS
  Filled 2023-01-18 (×9): qty 0.18

## 2023-01-18 MED ORDER — TRAZODONE HCL 50 MG PO TABS
50.0000 mg | ORAL_TABLET | Freq: Every day | ORAL | Status: DC
  Administered 2023-01-18 – 2023-01-27 (×10): 50 mg via ORAL
  Filled 2023-01-18 (×10): qty 1

## 2023-01-18 MED ORDER — HYDRALAZINE HCL 50 MG PO TABS
100.0000 mg | ORAL_TABLET | Freq: Three times a day (TID) | ORAL | Status: DC
  Administered 2023-01-18 – 2023-01-28 (×29): 100 mg via ORAL
  Filled 2023-01-18 (×29): qty 2

## 2023-01-18 MED ORDER — PANTOPRAZOLE SODIUM 40 MG PO TBEC
40.0000 mg | DELAYED_RELEASE_TABLET | Freq: Every day | ORAL | Status: DC
  Administered 2023-01-18 – 2023-01-28 (×11): 40 mg via ORAL
  Filled 2023-01-18 (×11): qty 1

## 2023-01-18 MED ORDER — AMLODIPINE BESYLATE 5 MG PO TABS
10.0000 mg | ORAL_TABLET | Freq: Every day | ORAL | Status: DC
  Administered 2023-01-18 – 2023-01-27 (×10): 10 mg via ORAL
  Filled 2023-01-18 (×10): qty 2

## 2023-01-18 NOTE — Plan of Care (Signed)

## 2023-01-18 NOTE — ED Notes (Signed)
EM provider at beside.

## 2023-01-18 NOTE — ED Notes (Signed)
ED TO INPATIENT HANDOFF REPORT  ED Nurse Name and Phone #: Duanne Guess [phone]  S Name/Age/Gender Maureen Jones 57 y.o. female Room/Bed: 008C/008C  Code Status   Code Status: Full Code  Triage Complete: Triage complete  Chief Complaint CAP (community acquired pneumonia) [J18.9]  Triage Note Pt BIB EMS from home for 2 days of feeling "sick", pt noticed bloody secretions and strong urine smell. EMS gave nitro, 650 tylenol, 4 zofran and pt refused ASA. PT is usually on 4lpm oxygen at home. Pt is A&Ox4, NAD at this time.    Allergies No Known Allergies  Level of Care/Admitting Diagnosis ED Disposition     ED Disposition  Admit   Condition  --   Comment  Hospital Area: [place] [[id number]]  Level of Care: Telemetry Medical [104]  May admit patient to [facility] or [facility] if equivalent level of care is available:: No  Covid Evaluation: Asymptomatic - no recent exposure (last 10 days) testing not required  Diagnosis: CAP (community acquired pneumonia) [382505]  Admitting Physician: Lequita Halt [[id number]]  Attending Physician: Lequita Halt [[id number]]  Certification:: I certify this patient will need inpatient services for at least 2 midnights  Estimated Length of Stay: 2          B Medical/Surgery History Past Medical History:  Diagnosis Date   Acute respiratory failure with hypoxemia ([place]) 12/07/2021   Acute respiratory failure with hypoxia ([place]) 12/07/2021   Asthma    Colitis    Diabetes ([place])    Encephalopathy acute 09/20/2021   Glottic and subglottic edema 2022   Hypertension    Past Surgical History:  Procedure Laterality Date   TRACHEOSTOMY REVISION  12/16/2021   Procedure: TRACHEOSTOMY EXCHANGE;  Surgeon: Juanito Doom, MD;  Location: [facility];  Service: Cardiopulmonary;;     A IV Location/Drains/Wounds Patient Lines/Drains/Airways Status     Active Line/Drains/Airways     Name Placement date Placement time Site  Days   Peripheral IV 01/18/23 20 G Right Antecubital 01/18/23  1104  Antecubital  less than 1   Tracheostomy Shiley XLT Proximal 6 mm Uncuffed 12/16/21  1338  6 mm  398   Tracheostomy Shiley XLT Proximal 6 mm Uncuffed 10/05/22  1458  6 mm  105   Tracheostomy Shiley XLT Distal 6 mm Uncuffed 01/18/23  1143  6 mm  less than 1            Intake/Output Last 24 hours No intake or output data in the 24 hours ending 01/18/23 1623  Labs/Imaging Results for orders placed or performed during the hospital encounter of 01/18/23 (from the past 48 hour(s))  Resp panel by RT-PCR (RSV, Flu A&B, Covid) Anterior Nasal Swab     Status: None   Collection Time: 01/18/23 12:40 PM   Specimen: Anterior Nasal Swab  Result Value Ref Range   SARS Coronavirus 2 by RT PCR NEGATIVE NEGATIVE   Influenza A by PCR NEGATIVE NEGATIVE   Influenza B by PCR NEGATIVE NEGATIVE    Comment: (NOTE) The Xpert Xpress SARS-CoV-2/FLU/RSV plus assay is intended as an aid in the diagnosis of influenza from Nasopharyngeal swab specimens and should not be used as a sole basis for treatment. Nasal washings and aspirates are unacceptable for Xpert Xpress SARS-CoV-2/FLU/RSV testing.  Fact Sheet for Patients: EntrepreneurPulse.com.au  Fact Sheet for Healthcare Providers: IncredibleEmployment.be  This test is not yet approved or cleared by the [place] FDA and has been authorized for detection and/or diagnosis  of SARS-CoV-2 by FDA under an Emergency Use Authorization (EUA). This EUA will remain in effect (meaning this test can be used) for the duration of the COVID-19 declaration under Section 564(b)(1) of the Act, 21 U.S.C. section 360bbb-3(b)(1), unless the authorization is terminated or revoked.     Resp Syncytial Virus by PCR NEGATIVE NEGATIVE    Comment: (NOTE) Fact Sheet for Patients: EntrepreneurPulse.com.au  Fact Sheet for Healthcare  Providers: IncredibleEmployment.be  This test is not yet approved or cleared by the [place] FDA and has been authorized for detection and/or diagnosis of SARS-CoV-2 by FDA under an Emergency Use Authorization (EUA). This EUA will remain in effect (meaning this test can be used) for the duration of the COVID-19 declaration under Section 564(b)(1) of the Act, 21 U.S.C. section 360bbb-3(b)(1), unless the authorization is terminated or revoked.  Performed at [facility], [place] [address], [place], [place] [address]   Basic metabolic panel     Status: Abnormal   Collection Time: 01/18/23 12:46 PM  Result Value Ref Range   Sodium 138 135 - 145 mmol/L   Potassium 4.6 3.5 - 5.1 mmol/L   Chloride 106 98 - 111 mmol/L   CO2 19 (L) 22 - 32 mmol/L   Glucose, Bld 275 (H) 70 - 99 mg/dL    Comment: Glucose reference range applies only to samples taken after fasting for at least 8 hours.   BUN 74 (H) 6 - 20 mg/dL   Creatinine, Ser 8.49 (H) 0.44 - 1.00 mg/dL   Calcium 7.9 (L) 8.9 - 10.3 mg/dL   GFR, Estimated 5 (L) >60 mL/min    Comment: (NOTE) Calculated using the CKD-EPI Creatinine Equation (2021)    Anion gap 13 5 - 15    Comment: Performed at [facility] [address] [place], [place] [address]  CBC with Differential     Status: Abnormal   Collection Time: 01/18/23 12:46 PM  Result Value Ref Range   WBC 15.5 (H) 4.0 - 10.5 K/uL   RBC 2.32 (L) 3.87 - 5.11 MIL/uL   Hemoglobin 6.8 (LL) 12.0 - 15.0 g/dL    Comment: REPEATED TO VERIFY THIS CRITICAL RESULT HAS VERIFIED AND BEEN CALLED TO Aristidis Talerico RN BY ELHAM AHMED ON 02 07 2024 AT 0263, AND HAS BEEN READ BACK.     HCT 21.2 (L) 36.0 - 46.0 %   MCV 91.4 80.0 - 100.0 fL   MCH 29.3 26.0 - 34.0 pg   MCHC 32.1 30.0 - 36.0 g/dL   RDW 12.9 11.5 - 15.5 %   Platelets 298 150 - 400 K/uL   nRBC 0.0 0.0 - 0.2 %   Neutrophils Relative % 73 %   Neutro Abs 11.4 (H) 1.7 - 7.7 K/uL   Lymphocytes Relative 15  %   Lymphs Abs 2.3 0.7 - 4.0 K/uL   Monocytes Relative 9 %   Monocytes Absolute 1.3 (H) 0.1 - 1.0 K/uL   Eosinophils Relative 2 %   Eosinophils Absolute 0.3 0.0 - 0.5 K/uL   Basophils Relative 0 %   Basophils Absolute 0.1 0.0 - 0.1 K/uL   Immature Granulocytes 1 %   Abs Immature Granulocytes 0.10 (H) 0.00 - 0.07 K/uL    Comment: Performed at [facility] [address] [place], [place] [address]  Type and screen     Status: None (Preliminary result)   Collection Time: 01/18/23  2:38 PM  Result Value Ref Range   ABO/RH(D) O POS  Antibody Screen PENDING    Sample Expiration      01/21/2023,2359 Performed at [facility] [address] [place], [place] [address]   Prepare RBC (crossmatch)     Status: None   Collection Time: 01/18/23  3:08 PM  Result Value Ref Range   Order Confirmation      ORDER PROCESSED BY BLOOD BANK Performed at [facility], [place] [address] [place], [place] [address]   Reticulocytes     Status: Abnormal   Collection Time: 01/18/23  3:22 PM  Result Value Ref Range   Retic Ct Pct 2.5 0.4 - 3.1 %   RBC. 2.48 (L) 3.87 - 5.11 MIL/uL   Retic Count, Absolute 62.7 19.0 - 186.0 K/uL   Immature Retic Fract 23.0 (H) 2.3 - 15.9 %    Comment: Performed at [facility] [address], [place],  [address]   DG Chest 2 View  Result Date: 01/18/2023 CLINICAL DATA:  Shortness of breath, chest pain, hemoptysis, fever, cough, and congestion for 2 days, EXAM: CHEST - 2 VIEW COMPARISON:  12/12/2022 FINDINGS: Tracheostomy tube projects over tracheal air column above carina. Enlargement of cardiac silhouette. Mediastinal contours and pulmonary vascularity normal. Subsegmental atelectasis RIGHT base, increased. Remaining lungs clear. No infiltrate, pleural effusion, or pneumothorax. Osseous structures unremarkable. IMPRESSION: RIGHT basilar atelectasis. Enlargement of cardiac silhouette. Electronically Signed   By: Lavonia Dana M.D.   On: 01/18/2023  14:24    Pending Labs Unresulted Labs (From admission, onward)     Start     Ordered   01/19/23 0350  Basic metabolic panel  Tomorrow morning,   R        01/18/23 1613   01/19/23 0500  CBC  Tomorrow morning,   R        01/18/23 1613   01/18/23 2000  Hemoglobin and hematocrit, blood  Once-Timed,   TIMED        01/18/23 1613   01/18/23 1617  Hemoglobin A1c  Once,   R       Comments: To assess prior glycemic control    01/18/23 1617   01/18/23 1508  Vitamin B12  (Anemia Panel (PNL))  Once,   URGENT        01/18/23 1507   01/18/23 1508  Folate  (Anemia Panel (PNL))  Once,   URGENT        01/18/23 1507   01/18/23 1508  Iron and TIBC  (Anemia Panel (PNL))  Once,   URGENT        01/18/23 1507   01/18/23 1508  Ferritin  (Anemia Panel (PNL))  Once,   URGENT        01/18/23 1507            Vitals/Pain Today's Vitals   01/18/23 1142 01/18/23 1200 01/18/23 1300 01/18/23 1400  BP:  (!) 191/84 (!) 191/86 (!) 187/80  Pulse:  96 91 90  Resp:  (!) 21 16 19   Temp:      TempSrc:      SpO2:  99% 100% 100%  Weight: 108.9 kg     Height: 5\' 6"  (1.676 m)     PainSc:        Isolation Precautions Airborne and Contact precautions  Medications Medications  0.9 %  sodium chloride infusion (Manually program via Guardrails IV Fluids) (has no administration in time range)  furosemide (LASIX) injection 80 mg (has no administration in time range)  amLODipine (NORVASC) tablet 10  mg (has no administration in time range)  atorvastatin (LIPITOR) tablet 20 mg (has no administration in time range)  carvedilol (COREG) tablet 25 mg (has no administration in time range)  hydrALAZINE (APRESOLINE) tablet 100 mg (has no administration in time range)  isosorbide mononitrate (IMDUR) 24 hr tablet 30 mg (has no administration in time range)  DULoxetine (CYMBALTA) DR capsule 30 mg (has no administration in time range)  traZODone (DESYREL) tablet 50 mg (has no administration in time range)  insulin degludec  (TRESIBA) FlexTouch Pen SOPN (has no administration in time range)  famotidine (PEPCID) tablet 20 mg (has no administration in time range)  pantoprazole (PROTONIX) EC tablet 40 mg (has no administration in time range)  sodium bicarbonate tablet 650 mg (has no administration in time range)  sodium polystyrene (KAYEXALATE) 15 GM/60ML suspension 15 g (has no administration in time range)  azelastine (ASTELIN) 0.1 % nasal spray 2 spray (has no administration in time range)  fluticasone (FLONASE) 50 MCG/ACT nasal spray 2 spray (has no administration in time range)  levocetirizine (XYZAL) tablet 5 mg (has no administration in time range)  promethazine (PHENERGAN) tablet 25 mg (has no administration in time range)  insulin aspart (novoLOG) injection 0-9 Units (has no administration in time range)    Mobility walks     Focused Assessments Pulmonary Assessment Handoff:  Lung sounds: Bilateral Breath Sounds: Diminished L Breath Sounds: Diminished R Breath Sounds: Diminished O2 Device: Tracheostomy Collar O2 Flow Rate (L/min): 5.5 L/min (humidified)    R Recommendations: See Admitting Provider Note  Report given to:   Additional Notes:

## 2023-01-18 NOTE — ED Provider Notes (Signed)
[facility] Provider Note   CSN: [id number] Arrival date & time: 01/18/23  1123     History  Chief Complaint  Patient presents with   Hemoptysis   Shortness of Breath    Maureen Jones is a 57 y.o. female.  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   She comes in because over the last 2 days she has been having cough, today she started having hemoptysis.  She notes that her tracheostomy stoma was clogged, and when she coughed it got unclogged, followed by blood clots coming out.  Patient denies any chest pain, shortness of breath, but states that she feels a little tight with her chest.  She denies any fevers, chills but EMS noted that patient was febrile.  Patient states that she was sick last week with chills, fevers and a cough and bodyaches.  Patient denies any blood loss.  She is not on any blood thinners. Review of system is positive for malaise, leg swelling.  Home Medications Prior to Admission medications   Medication Sig Start Date End Date Taking? Authorizing Provider  amLODipine (NORVASC) 10 MG tablet Take 1 tablet (10 mg total) by mouth at bedtime. 04/01/22  Yes Nche, Charlene Brooke, NP  atorvastatin (LIPITOR) 20 MG tablet Take 1 tablet (20 mg total) by mouth daily. 04/01/22  Yes Nche, Charlene Brooke, NP  azelastine (ASTELIN) 0.1 % nasal spray Place 2 sprays into both nostrils 2 (two) times daily.   Yes [provider]  carvedilol (COREG) 25 MG tablet Take 1 tablet (25 mg total) by mouth 2 (two) times daily. 04/01/22  Yes Nche, Charlene Brooke, NP  cloNIDine (CATAPRES) 0.3 MG tablet Take 1 tablet (0.3 mg total) by mouth 3 (three) times daily. 11/02/22  Yes Nche, Charlene Brooke, NP  Continuous Blood Gluc Sensor (FREESTYLE LIBRE 14 DAY SENSOR) MISC 1 Units by Does not apply route every 14 (fourteen)  days. 07/04/22  Yes Nche, Charlene Brooke, NP  Cyanocobalamin (VITAMIN B12 PO) Take 1 tablet by mouth daily.   Yes [provider]  DULoxetine (CYMBALTA) 30 MG capsule Take 1 capsule (30 mg total) by mouth daily. 11/02/22  Yes Nche, Charlene Brooke, NP  EXCEDRIN MIGRAINE [phone] MG tablet Take 1 tablet by mouth every 6 (six) hours as needed for headache or migraine (or pain).   Yes [provider]  famotidine (PEPCID) 20 MG tablet Take 1 tablet (20 mg total) by mouth at bedtime. 11/10/22  Yes Nche, Charlene Brooke, NP  fluticasone (FLONASE) 50 MCG/ACT nasal spray Place 2 sprays into both nostrils daily. Patient taking differently: Place 1-2 sprays into both nostrils 2 (two) times daily. 04/12/22  Yes Nche, Charlene Brooke, NP  Humidifier MISC 1 Device as directed.   Yes [provider]  hydrALAZINE (APRESOLINE) 100 MG tablet Take 1 tablet (100 mg total) by mouth 3 (three) times daily. 04/01/22  Yes Nche, Charlene Brooke, NP  levocetirizine (XYZAL) 5 MG tablet Take 1 tablet (5 mg total) by mouth every evening. 04/12/22  Yes Nche, Charlene Brooke, NP  OXYGEN Inhale 4 L/min into the lungs at bedtime.   Yes [provider]  pantoprazole (PROTONIX) 40 MG tablet Take 1 tablet (40 mg total) by mouth daily. Patient taking differently: Take 40 mg by mouth daily before breakfast. 11/14/22  Yes Nche, Charlene Brooke, NP  promethazine (PHENERGAN) 25 MG tablet  Take 1 tablet (25 mg total) by mouth every 8 (eight) hours as needed for nausea or vomiting. 10/27/22  Yes Nche, Charlene Brooke, NP  sodium bicarbonate 650 MG tablet Take 1 tablet (650 mg total) by mouth 2 (two) times daily. 11/02/22  Yes Nche, Charlene Brooke, NP  torsemide (DEMADEX) 20 MG tablet Take 40 mg by mouth in the morning.   Yes [provider]  traZODone (DESYREL) 50 MG tablet Take 1 tablet (50 mg total) by mouth at bedtime. 07/22/22  Yes Nche, Charlene Brooke, NP  TRESIBA FLEXTOUCH 200 UNIT/ML FlexTouch Pen Inject 18 Units  into the skin 2 (two) times daily. Patient taking differently: Inject 32 Units into the skin 2 (two) times daily. 10/14/22  Yes Vann, Jessica U, DO  VITAMIN E PO Take 1 capsule by mouth daily.   Yes [provider]  glucose blood (ONETOUCH VERIO) test strip Use as instructed 04/01/22   Nche, Charlene Brooke, NP  Insulin Pen Needle 32G X 4 MM MISC Use to inject insulin up to 4 times daily as directed 04/01/22   Nche, Charlene Brooke, NP  sodium polystyrene (KAYEXALATE) 15 GM/60ML suspension Take 60 mLs (15 g total) by mouth daily. Patient not taking: Reported on 01/19/2023 10/14/22   Geradine Girt, DO  insulin aspart (NOVOLOG) 100 UNIT/ML injection Insulin (Novolog) sliding scale for additional Novolog: administer 45mnutes before meals. Sugar <150 - no units Sugar >151-200 - 4 units Sugar >200-250 - 6 units Sugar >251-300 - 10 units and call office. Patient taking differently: Inject 4-10 Units into the skin See admin instructions. Insulin (Novolog) sliding scale for additional Novolog: administer 564mutes before meals. Sugar <150 - no units Sugar >151-200 - 4 units Sugar >200-250 - 6 units Sugar >251-300 - 10 units and call office. 11/22/21 12/17/21  Nche, ChCharlene BrookeNP      Allergies    Patient has no known allergies.    Review of Systems   Review of Systems  All other systems reviewed and are negative.   Physical Exam Updated Vital Signs BP (!) 165/83 (BP Location: Left Arm)   Pulse 93   Temp 98.6 F (37 C) (Oral)   Resp 20   Ht 5' 6"$  (1.676 m)   Wt 104.8 kg   SpO2 97%   BMI 37.29 kg/m  Physical Exam Vitals and nursing note reviewed.  Constitutional:      Appearance: She is well-developed.  HENT:     Head: Atraumatic.  Neck:     Vascular: No JVD.  Cardiovascular:     Rate and Rhythm: Normal rate.  Pulmonary:     Effort: Pulmonary effort is normal.  Musculoskeletal:     Cervical back: Normal range of motion and neck supple.     Right lower leg: Edema present.      Left lower leg: Edema present.  Skin:    General: Skin is warm and dry.  Neurological:     Mental Status: She is alert and oriented to person, place, and time.     ED Results / Procedures / Treatments   Labs (all labs ordered are listed, but only abnormal results are displayed) Labs Reviewed  BASIC METABOLIC PANEL - Abnormal; Notable for the following components:      Result Value   CO2 19 (*)    Glucose, Bld 275 (*)    BUN 74 (*)    Creatinine, Ser 8.49 (*)    Calcium 7.9 (*)    GFR, Estimated 5 (*)  All other components within normal limits  CBC WITH DIFFERENTIAL/PLATELET - Abnormal; Notable for the following components:   WBC 15.5 (*)    RBC 2.32 (*)    Hemoglobin 6.8 (*)    HCT 21.2 (*)    Neutro Abs 11.4 (*)    Monocytes Absolute 1.3 (*)    Abs Immature Granulocytes 0.10 (*)    All other components within normal limits  VITAMIN B12 - Abnormal; Notable for the following components:   Vitamin B-12 1,255 (*)    All other components within normal limits  IRON AND TIBC - Abnormal; Notable for the following components:   Iron 26 (*)    TIBC 225 (*)    All other components within normal limits  RETICULOCYTES - Abnormal; Notable for the following components:   RBC. 2.48 (*)    Immature Retic Fract 23.0 (*)    All other components within normal limits  HEMOGLOBIN AND HEMATOCRIT, BLOOD - Abnormal; Notable for the following components:   Hemoglobin 7.3 (*)    HCT 23.7 (*)    All other components within normal limits  URIC ACID - Abnormal; Notable for the following components:   Uric Acid, Serum 8.6 (*)    All other components within normal limits  CK - Abnormal; Notable for the following components:   Total CK 290 (*)    All other components within normal limits  BASIC METABOLIC PANEL - Abnormal; Notable for the following components:   CO2 17 (*)    Glucose, Bld 204 (*)    BUN 73 (*)    Creatinine, Ser 8.06 (*)    Calcium 8.0 (*)    GFR, Estimated 5 (*)     Anion gap 16 (*)    All other components within normal limits  CBC - Abnormal; Notable for the following components:   WBC 14.4 (*)    RBC 2.56 (*)    Hemoglobin 7.5 (*)    HCT 23.6 (*)    All other components within normal limits  GLUCOSE, CAPILLARY - Abnormal; Notable for the following components:   Glucose-Capillary 228 (*)    All other components within normal limits  HEMOGLOBIN A1C - Abnormal; Notable for the following components:   Hgb A1c MFr Bld 7.6 (*)    All other components within normal limits  GLUCOSE, CAPILLARY - Abnormal; Notable for the following components:   Glucose-Capillary 199 (*)    All other components within normal limits  GLUCOSE, CAPILLARY - Abnormal; Notable for the following components:   Glucose-Capillary 168 (*)    All other components within normal limits  GLUCOSE, CAPILLARY - Abnormal; Notable for the following components:   Glucose-Capillary 192 (*)    All other components within normal limits  GLUCOSE, CAPILLARY - Abnormal; Notable for the following components:   Glucose-Capillary 211 (*)    All other components within normal limits  COMPREHENSIVE METABOLIC PANEL - Abnormal; Notable for the following components:   CO2 19 (*)    Glucose, Bld 212 (*)    BUN 76 (*)    Creatinine, Ser 8.63 (*)    Calcium 7.5 (*)    Total Protein 5.9 (*)    Albumin 2.1 (*)    AST 14 (*)    GFR, Estimated 5 (*)    All other components within normal limits  CBC - Abnormal; Notable for the following components:   WBC 13.6 (*)    RBC 2.44 (*)    Hemoglobin 7.0 (*)  HCT 22.7 (*)    All other components within normal limits  GLUCOSE, CAPILLARY - Abnormal; Notable for the following components:   Glucose-Capillary 198 (*)    All other components within normal limits  VITAMIN B12 - Abnormal; Notable for the following components:   Vitamin B-12 1,306 (*)    All other components within normal limits  MAGNESIUM - Abnormal; Notable for the following components:    Magnesium 1.6 (*)    All other components within normal limits  PHOSPHORUS - Abnormal; Notable for the following components:   Phosphorus 6.9 (*)    All other components within normal limits  GLUCOSE, CAPILLARY - Abnormal; Notable for the following components:   Glucose-Capillary 198 (*)    All other components within normal limits  GLUCOSE, CAPILLARY - Abnormal; Notable for the following components:   Glucose-Capillary 133 (*)    All other components within normal limits  GLUCOSE, CAPILLARY - Abnormal; Notable for the following components:   Glucose-Capillary 234 (*)    All other components within normal limits  COMPREHENSIVE METABOLIC PANEL - Abnormal; Notable for the following components:   CO2 20 (*)    Glucose, Bld 128 (*)    BUN 76 (*)    Creatinine, Ser 8.38 (*)    Calcium 7.7 (*)    Total Protein 6.0 (*)    Albumin 2.1 (*)    AST 12 (*)    GFR, Estimated 5 (*)    All other components within normal limits  CBC - Abnormal; Notable for the following components:   WBC 12.4 (*)    RBC 2.44 (*)    Hemoglobin 7.2 (*)    HCT 22.2 (*)    All other components within normal limits  GLUCOSE, CAPILLARY - Abnormal; Notable for the following components:   Glucose-Capillary 204 (*)    All other components within normal limits  GLUCOSE, CAPILLARY - Abnormal; Notable for the following components:   Glucose-Capillary 169 (*)    All other components within normal limits  RESP PANEL BY RT-PCR (RSV, FLU A&B, COVID)  RVPGX2  EXPECTORATED SPUTUM ASSESSMENT W GRAM STAIN, RFLX TO RESP C  FOLATE  FERRITIN  MYCOPLASMA PNEUMONIAE ANTIBODY, IGM  LEGIONELLA PNEUMOPHILA SEROGP 1 UR AG  PROCALCITONIN  PROCALCITONIN  PROCALCITONIN  TSH  MAGNESIUM  TYPE AND SCREEN  PREPARE RBC (CROSSMATCH)    EKG None  Radiology CT HEAD WO CONTRAST (5MM)  Result Date: 01/20/2023 CLINICAL DATA:  Transient ischemic attack. EXAM: CT HEAD WITHOUT CONTRAST TECHNIQUE: Contiguous axial images were obtained  from the base of the skull through the vertex without intravenous contrast. RADIATION DOSE REDUCTION: This exam was performed according to the departmental dose-optimization program which includes automated exposure control, adjustment of the mA and/or kV according to patient size and/or use of iterative reconstruction technique. COMPARISON:  Head CT 09/20/2021.  MRI brain 09/20/2021. FINDINGS: Brain: No acute hemorrhage, mass effect or midline shift. Gray-white differentiation is preserved. No hydrocephalus. No extra-axial collection. Basilar cisterns are patent. Vascular: No hyperdense vessel or unexpected calcification. Skull: No calvarial fracture or suspicious bone lesion. Skull base is unremarkable. Sinuses/Orbits: Unremarkable. Other: None. IMPRESSION: No acute intracranial abnormality. Electronically Signed   By: Emmit Alexanders M.D.   On: 01/20/2023 15:35    Procedures .Critical Care  Performed by: Varney Biles, MD Authorized by: Varney Biles, MD   Critical care provider statement:    Critical care time (minutes):  48   Critical care was necessary to treat or prevent imminent or  life-threatening deterioration of the following conditions:  Cardiac failure and circulatory failure   Critical care was time spent personally by me on the following activities:  Development of treatment plan with patient or surrogate, discussions with consultants, evaluation of patient's response to treatment, examination of patient, ordering and review of laboratory studies, ordering and review of radiographic studies, ordering and performing treatments and interventions, pulse oximetry, re-evaluation of patient's condition, review of old charts and obtaining history from patient or surrogate     Medications Ordered in ED Medications  amLODipine (NORVASC) tablet 10 mg (10 mg Oral Given 01/20/23 2150)  atorvastatin (LIPITOR) tablet 20 mg (20 mg Oral Given 01/21/23 1006)  carvedilol (COREG) tablet 25 mg (25 mg  Oral Given 01/21/23 1006)  hydrALAZINE (APRESOLINE) tablet 100 mg (100 mg Oral Given 01/21/23 1207)  isosorbide mononitrate (IMDUR) 24 hr tablet 30 mg (30 mg Oral Given 01/21/23 1006)  DULoxetine (CYMBALTA) DR capsule 30 mg (30 mg Oral Given 01/21/23 1006)  traZODone (DESYREL) tablet 50 mg (50 mg Oral Given 01/20/23 2150)  famotidine (PEPCID) tablet 20 mg (20 mg Oral Given 01/20/23 2149)  pantoprazole (PROTONIX) EC tablet 40 mg (40 mg Oral Given 01/21/23 1006)  sodium bicarbonate tablet 650 mg (650 mg Oral Given 01/21/23 1007)  sodium polystyrene (KAYEXALATE) 15 GM/60ML suspension 15 g (15 g Oral Given 01/21/23 1007)  azelastine (ASTELIN) 0.1 % nasal spray 2 spray (2 sprays Each Nare Given 01/21/23 1007)  fluticasone (FLONASE) 50 MCG/ACT nasal spray 2 spray (2 sprays Each Nare Given 01/21/23 1007)  cetirizine (ZYRTEC) tablet 5 mg (5 mg Oral Given 01/20/23 1800)  promethazine (PHENERGAN) tablet 25 mg (25 mg Oral Given 01/20/23 1339)  insulin aspart (novoLOG) injection 0-9 Units (2 Units Subcutaneous Given 01/21/23 1208)  cefTRIAXone (ROCEPHIN) 2 g in sodium chloride 0.9 % 100 mL IVPB (2 g Intravenous New Bag/Given 01/20/23 1808)  azithromycin (ZITHROMAX) 500 mg in sodium chloride 0.9 % 250 mL IVPB (500 mg Intravenous New Bag/Given 01/20/23 1653)  guaiFENesin (MUCINEX) 12 hr tablet 1,200 mg (1,200 mg Oral Given 01/21/23 1207)  cloNIDine (CATAPRES) tablet 0.2 mg (0.2 mg Oral Given 01/21/23 1005)  labetalol (NORMODYNE) injection 10 mg (has no administration in time range)  ferrous sulfate tablet 325 mg (325 mg Oral Given 01/21/23 1006)  insulin glargine-yfgn (SEMGLEE) injection 18 Units (18 Units Subcutaneous Given 01/21/23 1012)  0.9 %  sodium chloride infusion (has no administration in time range)  ipratropium-albuterol (DUONEB) 0.5-2.5 (3) MG/3ML nebulizer solution 3 mL (has no administration in time range)  lidocaine (LMX) 4 % cream (has no administration in time range)  gabapentin (NEURONTIN) capsule 100 mg (100 mg  Oral Given 01/20/23 2148)  torsemide (DEMADEX) tablet 40 mg (40 mg Oral Given 01/21/23 1005)  0.9 %  sodium chloride infusion (Manually program via Guardrails IV Fluids) (0 mLs Intravenous Stopping previously hung infusion 01/18/23 2011)  Darbepoetin Alfa (ARANESP) injection 60 mcg (60 mcg Subcutaneous Given 01/18/23 2045)  magnesium sulfate IVPB 4 g 100 mL (4 g Intravenous New Bag/Given 01/20/23 0925)    ED Course/ Medical Decision Making/ A&P                             Medical Decision Making 58 year old female with history of hypertension, diabetes, asthma, subglottic stenosis leading to trach placement comes in with chief complaint of hemoptysis.  Patient has been having cough, URI-like symptoms for the last few days.  On her lung exam, she  have rhonchorous breath sounds.  She recommends subjective fevers.  Patient noted to be tachycardic, tachypneic in the emergency room.  Differential diagnosis for her includes community-acquired pneumonia, aspiration pneumonia, PE, lung tumor/nodule.  High suspicion for this being infectious in nature.  X-ray of the chest independently interpreted, it looks overall reassuring. In the ER, patient is not having severe hemoptysis.  No indication for CT scan right now.  Plan is to admit her as a pneumonia, ensure that she is not getting worse, in which case she can be discharged.  Amount and/or Complexity of Data Reviewed Labs: ordered. Radiology: ordered.  Risk Prescription drug management. Decision regarding hospitalization.    Final Clinical Impression(s) / ED Diagnoses Final diagnoses:  Symptomatic anemia  Hemoptysis    Rx / DC Orders ED Discharge Orders     None         Varney Biles, MD 01/21/23 1551

## 2023-01-18 NOTE — H&P (Signed)
History and Physical    Maureen Jones EXN:[id number] DOB: [birth year] DOA: 01/18/2023  PCP: Flossie Buffy, NP (Confirm with patient/family/NH records and if not entered, this has to be entered at [facility] point of entry) Patient coming from: Home  I have personally briefly reviewed patient's old medical records in [place]  Chief Complaint: SOB, feeling weak, coughing up blood  HPI: Maureen Jones is a 57 y.o. 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  Symptoms started 2 days ago, initially, patient had uncontrolled sinusitis with increasing runny nose and postnasal drips, soon she developed productive cough with clear phlegm and yesterday she started to see streaks of blood and blood clot with the phlegm, and increasing shortness of breath.  This morning, patient spiked fever of 101.0 and decided to come to ED denies any chest pain, denies any abdominal pain nauseous vomiting melena or bloody stool.  She takes torsemide 80 mg twice daily however she has seen increasing bilateral lower extremity swelling and she also noticed strong urine smell this week.  ED Course: Blood pressure significant elevated, O2 saturation above her baseline with 6 L via tracheostomy, 30 showed mild pulmonary congestion with right lower lobe atelectasis.  Hemoglobin 6.8 compared to her baseline around 8.0, creatinine 8.4 compared to baseline 7.8 last month, BUN 74, K4.6.  One unit of PRBC ordered in ED.  Review of Systems: As per HPI otherwise 14 point review of systems negative.    Past Medical History:  Diagnosis Date   Acute respiratory failure with hypoxemia ([place]) 12/07/2021   Acute respiratory failure with hypoxia ([facility]) 12/07/2021   Asthma    Colitis    Diabetes ([place])    Encephalopathy  acute 09/20/2021   Glottic and subglottic edema 2022   Hypertension     Past Surgical History:  Procedure Laterality Date   TRACHEOSTOMY REVISION  12/16/2021   Procedure: TRACHEOSTOMY EXCHANGE;  Surgeon: Juanito Doom, MD;  Location: [facility] ENDOSCOPY;  Service: Cardiopulmonary;;     reports that she has quit smoking. Her smoking use included cigarettes. She has never used smokeless tobacco. She reports that she does not drink alcohol and does not use drugs.  No Known Allergies  Family History  Family history unknown: Yes     Prior to Admission medications   Medication Sig Start Date End Date Taking? Authorizing Provider  amLODipine (NORVASC) 10 MG tablet Take 1 tablet (10 mg total) by mouth at bedtime. 04/01/22   Nche, Charlene Brooke, NP  atorvastatin (LIPITOR) 20 MG tablet Take 1 tablet (20 mg total) by mouth daily. 04/01/22   Nche, Charlene Brooke, NP  azelastine (ASTELIN) 0.1 % nasal spray Place 2 sprays into both nostrils 2 (two) times daily. Use in each nostril as directed    [provider]  carvedilol (COREG) 25 MG tablet Take 1 tablet (25 mg total) by mouth 2 (two) times daily. 04/01/22   Nche, Charlene Brooke, NP  cloNIDine (CATAPRES) 0.3 MG tablet Take 1 tablet (0.3 mg total) by mouth 3 (three) times daily. 11/02/22   Nche, Charlene Brooke, NP  Continuous Blood Gluc Sensor (FREESTYLE LIBRE 14 DAY SENSOR) MISC 1 Units by Does not apply route every 14 (fourteen) days. 07/04/22   Nche, Charlene Brooke, NP  DULoxetine (CYMBALTA) 30 MG capsule Take 1 capsule (30 mg total) by  mouth daily. 11/02/22   Nche, Charlene Brooke, NP  famotidine (PEPCID) 20 MG tablet Take 1 tablet (20 mg total) by mouth at bedtime. 11/10/22   Nche, Charlene Brooke, NP  fluticasone (FLONASE) 50 MCG/ACT nasal spray Place 2 sprays into both nostrils daily. 04/12/22   Nche, Charlene Brooke, NP  glucose blood (ONETOUCH VERIO) test strip Use as instructed 04/01/22   Nche, Charlene Brooke, NP  hydrALAZINE (APRESOLINE) 100 MG  tablet Take 1 tablet (100 mg total) by mouth 3 (three) times daily. 04/01/22   Nche, Charlene Brooke, NP  Insulin Pen Needle 32G X 4 MM MISC Use to inject insulin up to 4 times daily as directed 04/01/22   Nche, Charlene Brooke, NP  levocetirizine (XYZAL) 5 MG tablet Take 1 tablet (5 mg total) by mouth every evening. 04/12/22   Nche, Charlene Brooke, NP  pantoprazole (PROTONIX) 40 MG tablet Take 1 tablet (40 mg total) by mouth daily. 11/14/22   Nche, Charlene Brooke, NP  promethazine (PHENERGAN) 25 MG tablet Take 1 tablet (25 mg total) by mouth every 8 (eight) hours as needed for nausea or vomiting. 10/27/22   Nche, Charlene Brooke, NP  sodium bicarbonate 650 MG tablet Take 1 tablet (650 mg total) by mouth 2 (two) times daily. 11/02/22   Nche, Charlene Brooke, NP  sodium polystyrene (KAYEXALATE) 15 GM/60ML suspension Take 60 mLs (15 g total) by mouth daily. 10/14/22   Geradine Girt, DO  traZODone (DESYREL) 50 MG tablet Take 1 tablet (50 mg total) by mouth at bedtime. 07/22/22   Nche, Charlene Brooke, NP  TRESIBA FLEXTOUCH 200 UNIT/ML FlexTouch Pen Inject 18 Units into the skin 2 (two) times daily. Patient taking differently: Inject 32 Units into the skin 2 (two) times daily. 10/14/22   Geradine Girt, DO  insulin aspart (NOVOLOG) 100 UNIT/ML injection Insulin (Novolog) sliding scale for additional Novolog: administer 60minutes before meals. Sugar <150 - no units Sugar >151-200 - 4 units Sugar >200-250 - 6 units Sugar >251-300 - 10 units and call office. Patient taking differently: Inject 4-10 Units into the skin See admin instructions. Insulin (Novolog) sliding scale for additional Novolog: administer 50minutes before meals. Sugar <150 - no units Sugar >151-200 - 4 units Sugar >200-250 - 6 units Sugar >251-300 - 10 units and call office. 11/22/21 12/17/21  Flossie Buffy, NP    Physical Exam: Vitals:   01/18/23 1142 01/18/23 1200 01/18/23 1300 01/18/23 1400  BP:  (!) 191/84 (!) 191/86 (!) 187/80  Pulse:  96  91 90  Resp:  (!) 21 16 19   Temp:      TempSrc:      SpO2:  99% 100% 100%  Weight: 108.9 kg     Height: 5\' 6"  (1.676 m)       Constitutional: NAD, calm, comfortable Vitals:   01/18/23 1142 01/18/23 1200 01/18/23 1300 01/18/23 1400  BP:  (!) 191/84 (!) 191/86 (!) 187/80  Pulse:  96 91 90  Resp:  (!) 21 16 19   Temp:      TempSrc:      SpO2:  99% 100% 100%  Weight: 108.9 kg     Height: 5\' 6"  (1.676 m)      Eyes: PERRL, lids and conjunctivae normal ENMT: Mucous membranes are moist. Posterior pharynx clear of any exudate or lesions.Normal dentition.  Neck: normal, supple, no masses, no thyromegaly Respiratory: Diminished breathing sound right lower field, no wheezing, fine crackles on bilateral lung fields, increasing respiratory effort. No accessory muscle use.  Cardiovascular: Regular rate and rhythm, no murmurs / rubs / gallops. 2+extremity edema. 2+ pedal pulses. No carotid bruits.  Abdomen: no tenderness, no masses palpated. No hepatosplenomegaly. Bowel sounds positive.  Musculoskeletal: no clubbing / cyanosis. No joint deformity upper and lower extremities. Good ROM, no contractures. Normal muscle tone.  Skin: no rashes, lesions, ulcers. No induration Neurologic: CN 2-12 grossly intact. Sensation intact, DTR normal. Strength 5/5 in all 4.  Psychiatric: Normal judgment and insight. Alert and oriented x 3. Normal mood.     Labs on Admission: I have personally reviewed following labs and imaging studies  CBC: Recent Labs  Lab 01/18/23 1246  WBC 15.5*  NEUTROABS 11.4*  HGB 6.8*  HCT 21.2*  MCV 91.4  PLT 245   Basic Metabolic Panel: Recent Labs  Lab 01/18/23 1246  NA 138  K 4.6  CL 106  CO2 19*  GLUCOSE 275*  BUN 74*  CREATININE 8.49*  CALCIUM 7.9*   GFR: Estimated Creatinine Clearance: 9.3 mL/min (A) (by C-G formula based on SCr of 8.49 mg/dL (H)). Liver Function Tests: No results for input(s): "AST", "ALT", "ALKPHOS", "BILITOT", "PROT", "ALBUMIN" in the  last 168 hours. No results for input(s): "LIPASE", "AMYLASE" in the last 168 hours. No results for input(s): "AMMONIA" in the last 168 hours. Coagulation Profile: No results for input(s): "INR", "PROTIME" in the last 168 hours. Cardiac Enzymes: No results for input(s): "CKTOTAL", "CKMB", "CKMBINDEX", "TROPONINI" in the last 168 hours. BNP (last 3 results) Recent Labs    04/01/22 1147  PROBNP 306*   HbA1C: No results for input(s): "HGBA1C" in the last 72 hours. CBG: No results for input(s): "GLUCAP" in the last 168 hours. Lipid Profile: No results for input(s): "CHOL", "HDL", "LDLCALC", "TRIG", "CHOLHDL", "LDLDIRECT" in the last 72 hours. Thyroid Function Tests: No results for input(s): "TSH", "T4TOTAL", "FREET4", "T3FREE", "THYROIDAB" in the last 72 hours. Anemia Panel: Recent Labs    01/18/23 1522  RETICCTPCT 2.5   Urine analysis:    Component Value Date/Time   COLORURINE STRAW (A) 12/12/2022 0135   APPEARANCEUR CLEAR 12/12/2022 0135   LABSPEC 1.010 12/12/2022 0135   PHURINE 6.0 12/12/2022 0135   GLUCOSEU >=500 (A) 12/12/2022 0135   HGBUR SMALL (A) 12/12/2022 0135   BILIRUBINUR NEGATIVE 12/12/2022 0135   KETONESUR NEGATIVE 12/12/2022 0135   PROTEINUR >=300 (A) 12/12/2022 0135   NITRITE NEGATIVE 12/12/2022 0135   LEUKOCYTESUR NEGATIVE 12/12/2022 0135    Radiological Exams on Admission: DG Chest 2 View  Result Date: 01/18/2023 CLINICAL DATA:  Shortness of breath, chest pain, hemoptysis, fever, cough, and congestion for 2 days, EXAM: CHEST - 2 VIEW COMPARISON:  12/12/2022 FINDINGS: Tracheostomy tube projects over tracheal air column above carina. Enlargement of cardiac silhouette. Mediastinal contours and pulmonary vascularity normal. Subsegmental atelectasis RIGHT base, increased. Remaining lungs clear. No infiltrate, pleural effusion, or pneumothorax. Osseous structures unremarkable. IMPRESSION: RIGHT basilar atelectasis. Enlargement of cardiac silhouette. Electronically  Signed   By: Lavonia Dana M.D.   On: 01/18/2023 14:24    EKG: Ordered  Assessment/Plan Principal Problem:   CAP (community acquired pneumonia) Active Problems:   Endotracheal tube present   Vocal cord dysfunction   OSA (obstructive sleep apnea)   DM (diabetes mellitus) (HCC)   CKD (chronic kidney disease) stage 5, GFR less than 15 ml/min (HCC)   CAP (community acquired pneumonia) due to Pneumococcus ([place])   AKI (acute kidney injury) ([place])  (please populate well all problems here in Problem List. (For example, if patient is on BP  meds at home and you resume or decide to hold them, it is a problem that needs to be her. Same for CAD, COPD, HLD and so on)  Acute on chronic hypoxic respiratory failure -With chronic tracheostomy -Likely secondary to CAP, given the worsening of productive cough and new fever -CAP coverage with ceftriaxone and azithromycin, culture sputum, sent atypical studies including mycoplasma and Legionella -Other supportive medication and measures including DuoNebs, guaifenesin, incentive spirometry and flutter valve  Acute on chronic symptomatic anemia -Probably worsening of her baseline CKD related anemia, may also has a element of hemoptysis probably associated with worsening cough and PNA.  -Iron study pending -Low reticulocyte count compatible with CKD associated anemia, will see her iron study result then decide whether to give her 1 dose of EPO on this admission.  Hemoptysis -Likely related to pneumonia, getting PRBC, repeat H&H tonight -Treat pneumonia then reevaluate.  Uremia is chronic, as of now there is no symptoms signs of life-threatening bleeding, will hold off DDAVP.  Acute on chronic HFpEF -Likely secondary to combined effect of symptomatic anemia and worsening of her kidney function.  Patient was seen by nephrology about 1 month ago and was told that plan is to postpone HD as soon as possible.  Currently, patient still makes sufficient urine, and  resolve her creatinine and BUN appears to be gradual.  Modify her HTN and diabetic control and switch p.o. torsemide 80 mg BID to IV Lasix 80 mg twice daily and monitor kidney function.  AKI on CKD stage V -Significant fluid overload, management as above -Continue bicarb twice daily and Lokelma -Uric acid and CK level sent  Chronic proteinuria -Appears to be in the nephrotic range -ACEI was discontinued November 2023 due to hyperkalemia. -Will need more stringent HTN aiming at 130/80, and diabetic control as below.  IDDM with insulin resistance -Continue Lantus 32 unit twice daily -Add sliding scale  HTN uncontrolled -Continue amlodipine and Coreg -Continue hydralazine 100 mg 3 times daily, add Imdur -Wean off clonidine in 2-3 days, given the history of CHF  Morbid obesity -BMI=38 -Cut down calorie intake   DVT prophylaxis: SCD Code Status: Full code Family Communication: None at bedside Disposition Plan: Expect more than 2 midnight hospital stay, as patient is sick with acute on chronic anemia, CHF decompensation, and pneumonia requiring inpatient management. Consults called: None Admission status: Telemetry admission   Lequita Halt MD [facility] Pager [phone]  01/18/2023, 4:15 PM

## 2023-01-18 NOTE — ED Notes (Signed)
Pt to CT

## 2023-01-18 NOTE — Progress Notes (Addendum)
Iron level borderline low, one time dose of Epogen ordered   Start iron supplement  CT chest reviewed. Right now, can no tell the difference of fluid overload vs multifocal PNA, and patient is on both ABX and diuresis. Will seen procalcitonin

## 2023-01-18 NOTE — ED Notes (Signed)
Pt to xray

## 2023-01-18 NOTE — ED Notes (Signed)
Blood consent completed and verified by this RN. Consent remains at bedside.

## 2023-01-18 NOTE — ED Triage Notes (Signed)
Pt BIB EMS from home for 2 days of feeling "sick", pt noticed bloody secretions and strong urine smell. EMS gave nitro, 650 tylenol, 4 zofran and pt refused ASA. PT is usually on 4lpm oxygen at home. Pt is A&Ox4, NAD at this time.

## 2023-01-19 DIAGNOSIS — R042 Hemoptysis: Secondary | ICD-10-CM

## 2023-01-19 DIAGNOSIS — D649 Anemia, unspecified: Secondary | ICD-10-CM

## 2023-01-19 LAB — PROCALCITONIN
Procalcitonin: 0.19 ng/mL
Procalcitonin: 0.19 ng/mL

## 2023-01-19 LAB — BASIC METABOLIC PANEL
Anion gap: 16 — ABNORMAL HIGH (ref 5–15)
BUN: 73 mg/dL — ABNORMAL HIGH (ref 6–20)
CO2: 17 mmol/L — ABNORMAL LOW (ref 22–32)
Calcium: 8 mg/dL — ABNORMAL LOW (ref 8.9–10.3)
Chloride: 106 mmol/L (ref 98–111)
Creatinine, Ser: 8.06 mg/dL — ABNORMAL HIGH (ref 0.44–1.00)
GFR, Estimated: 5 mL/min — ABNORMAL LOW (ref >60)
Glucose, Bld: 204 mg/dL — ABNORMAL HIGH (ref 70–99)
Potassium: 4.9 mmol/L (ref 3.5–5.1)
Sodium: 139 mmol/L (ref 135–145)

## 2023-01-19 LAB — TYPE AND SCREEN
ABO/RH(D): O POS
Antibody Screen: NEGATIVE
Unit division: 0

## 2023-01-19 LAB — BPAM RBC
Blood Product Expiration Date: 202403082359
ISSUE DATE / TIME: 202402071654
Unit Type and Rh: 5100

## 2023-01-19 LAB — GLUCOSE, CAPILLARY
Glucose-Capillary: 168 mg/dL — ABNORMAL HIGH (ref 70–99)
Glucose-Capillary: 192 mg/dL — ABNORMAL HIGH (ref 70–99)
Glucose-Capillary: 211 mg/dL — ABNORMAL HIGH (ref 70–99)
Glucose-Capillary: 198 mg/dL — ABNORMAL HIGH (ref 70–99)

## 2023-01-19 LAB — CBC
HCT: 23.6 % — ABNORMAL LOW (ref 36.0–46.0)
Hemoglobin: 7.5 g/dL — ABNORMAL LOW (ref 12.0–15.0)
MCH: 29.3 pg (ref 26.0–34.0)
MCHC: 31.8 g/dL (ref 30.0–36.0)
MCV: 92.2 fL (ref 80.0–100.0)
Platelets: 269 10*3/uL (ref 150–400)
RBC: 2.56 MIL/uL — ABNORMAL LOW (ref 3.87–5.11)
RDW: 12.9 % (ref 11.5–15.5)
WBC: 14.4 10*3/uL — ABNORMAL HIGH (ref 4.0–10.5)
nRBC: 0 % (ref 0.0–0.2)

## 2023-01-19 MED ORDER — IPRATROPIUM-ALBUTEROL 0.5-2.5 (3) MG/3ML IN SOLN
3.0000 mL | Freq: Four times a day (QID) | RESPIRATORY_TRACT | Status: DC | PRN
  Administered 2023-01-26: 3 mL via RESPIRATORY_TRACT
  Filled 2023-01-19: qty 3

## 2023-01-19 NOTE — Progress Notes (Signed)
  Progress Note   Patient: Maureen Jones [id number]:[id number] DOB: June 29, 1966 DOA: 01/18/2023     1 DOS: the patient was seen and examined on 01/19/2023   Brief hospital course: 57 y.o. 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   Pt presented with sob, streaky blood in sputum, reported fevers. Pt admitted for concerns of volume overload and CAP  Assessment and Plan: Acute on chronic hypoxic respiratory failure -With chronic tracheostomy -chest imaging reviewed. Patchy disease that can be both edema vs PNA -Likely underlying CAP, given the worsening of productive cough and new fever -CAP coverage with ceftriaxone and azithromycin, culture sputum, sent atypical studies including mycoplasma and Legionella -Other supportive medication and measures including DuoNebs, guaifenesin, incentive spirometry and flutter valve -continued on diuretics as tolerated. Pt did report 20lb wt gain over past month   Acute on chronic symptomatic anemia -Probably worsening of her baseline CKD related anemia -Pt s/p 1 unit PRBC -Recheck cbc in AM   Hemoptysis -Likely related to pneumonia -Seems stable at this time   Acute on chronic HFpEF -Responding to lasix, diuresing well -Cont diuresis as tolerated -Cr down to 8.0 this AM   AKI on CKD stage V -Significant fluid overload, management as above -Continue bicarb twice daily and Lokelma -Baseline Cr around 7.5, presenting at 8.4 but down to 8.0 this AM -Not uremic on exam, diuresing well with lasix -recheck bmet in AM   Chronic proteinuria -Appears to be in the nephrotic range -ACEI was discontinued November 2023 due to hyperkalemia.   IDDM with insulin resistance -On semglee 18 units bid -Cont SSI   HTN uncontrolled -Continue amlodipine and  Coreg -Continue hydralazine 100 mg 3 times daily, add Imdur -Wean off clonidine in 2-3 days, given the history of CHF   Morbid obesity -BMI=38 -Cut down calorie intake      Subjective: Reports feeling better today, asking about going home   Physical Exam: Vitals:   01/19/23 0851 01/19/23 1007 01/19/23 1100 01/19/23 1518  BP:  (!) 163/76    Pulse: 78 85 82 86  Resp:  18 18   Temp:  98.4 F (36.9 C)    TempSrc:  Oral    SpO2: 95% 100% 100% 97%  Weight:      Height:       General exam: Awake, laying in bed, in nad Respiratory system: Normal respiratory effort, no wheezing, trach in place Cardiovascular system: regular rate, s1, s2 Gastrointestinal system: Soft, nondistended, positive BS Central nervous system: CN2-12 grossly intact, strength intact Extremities: Perfused, no clubbing Skin: Normal skin turgor, no notable skin lesions seen Psychiatry: Mood normal // no visual hallucinations   Data Reviewed:  Labs reviewed: na 139, K 4.9, Cr 8.06  Family Communication: Pt in room, family not at bedside  Disposition: Status is: Inpatient Remains inpatient appropriate because: Severity of illness  Planned Discharge Destination: Home    Author: Marylu Lund, MD 01/19/2023 4:15 PM  For on call review [email or web address].

## 2023-01-19 NOTE — Hospital Course (Addendum)
Maureen Jones is a 57 y.o. 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   Pt presented with sob, streaky blood in sputum, reported fevers. Pt admitted for concerns of volume overload and CAP.

## 2023-01-20 ENCOUNTER — Inpatient Hospital Stay (HOSPITAL_COMMUNITY): Payer: Medicare Other

## 2023-01-20 DIAGNOSIS — R042 Hemoptysis: Secondary | ICD-10-CM | POA: Diagnosis not present

## 2023-01-20 DIAGNOSIS — D649 Anemia, unspecified: Secondary | ICD-10-CM | POA: Diagnosis not present

## 2023-01-20 LAB — CBC
HCT: 22.7 % — ABNORMAL LOW (ref 36.0–46.0)
Hemoglobin: 7 g/dL — ABNORMAL LOW (ref 12.0–15.0)
MCH: 28.7 pg (ref 26.0–34.0)
MCHC: 30.8 g/dL (ref 30.0–36.0)
MCV: 93 fL (ref 80.0–100.0)
Platelets: 250 10*3/uL (ref 150–400)
RBC: 2.44 MIL/uL — ABNORMAL LOW (ref 3.87–5.11)
RDW: 13.2 % (ref 11.5–15.5)
WBC: 13.6 10*3/uL — ABNORMAL HIGH (ref 4.0–10.5)
nRBC: 0 % (ref 0.0–0.2)

## 2023-01-20 LAB — LEGIONELLA PNEUMOPHILA SEROGP 1 UR AG: L. pneumophila Serogp 1 Ur Ag: NEGATIVE

## 2023-01-20 LAB — COMPREHENSIVE METABOLIC PANEL
ALT: 10 U/L (ref 0–44)
AST: 14 U/L — ABNORMAL LOW (ref 15–41)
Albumin: 2.1 g/dL — ABNORMAL LOW (ref 3.5–5.0)
Alkaline Phosphatase: 102 U/L (ref 38–126)
Anion gap: 14 (ref 5–15)
BUN: 76 mg/dL — ABNORMAL HIGH (ref 6–20)
CO2: 19 mmol/L — ABNORMAL LOW (ref 22–32)
Calcium: 7.5 mg/dL — ABNORMAL LOW (ref 8.9–10.3)
Chloride: 105 mmol/L (ref 98–111)
Creatinine, Ser: 8.63 mg/dL — ABNORMAL HIGH (ref 0.44–1.00)
GFR, Estimated: 5 mL/min — ABNORMAL LOW (ref >60)
Glucose, Bld: 212 mg/dL — ABNORMAL HIGH (ref 70–99)
Potassium: 4.4 mmol/L (ref 3.5–5.1)
Sodium: 138 mmol/L (ref 135–145)
Total Bilirubin: 0.4 mg/dL (ref 0.3–1.2)
Total Protein: 5.9 g/dL — ABNORMAL LOW (ref 6.5–8.1)

## 2023-01-20 LAB — MAGNESIUM: Magnesium: 1.6 mg/dL — ABNORMAL LOW (ref 1.7–2.4)

## 2023-01-20 LAB — GLUCOSE, CAPILLARY
Glucose-Capillary: 198 mg/dL — ABNORMAL HIGH (ref 70–99)
Glucose-Capillary: 133 mg/dL — ABNORMAL HIGH (ref 70–99)
Glucose-Capillary: 234 mg/dL — ABNORMAL HIGH (ref 70–99)
Glucose-Capillary: 204 mg/dL — ABNORMAL HIGH (ref 70–99)

## 2023-01-20 LAB — MYCOPLASMA PNEUMONIAE ANTIBODY, IGM: Mycoplasma pneumo IgM: <770 U/mL (ref 0–769)

## 2023-01-20 LAB — TSH: TSH: 3.531 u[IU]/mL (ref 0.350–4.500)

## 2023-01-20 LAB — PROCALCITONIN: Procalcitonin: 0.2 ng/mL

## 2023-01-20 LAB — VITAMIN B12: Vitamin B-12: 1306 pg/mL — ABNORMAL HIGH (ref 180–914)

## 2023-01-20 LAB — PHOSPHORUS: Phosphorus: 6.9 mg/dL — ABNORMAL HIGH (ref 2.5–4.6)

## 2023-01-20 MED ORDER — LIDOCAINE 4 % EX CREA: TOPICAL_CREAM | Freq: Every day | CUTANEOUS | Status: DC | PRN

## 2023-01-20 MED ORDER — MAGNESIUM SULFATE 4 GM/100ML IV SOLN
4.0000 g | Freq: Once | INTRAVENOUS | Status: AC
  Administered 2023-01-20: 4 g via INTRAVENOUS
  Filled 2023-01-20: qty 100

## 2023-01-20 MED ORDER — TORSEMIDE 20 MG PO TABS
40.0000 mg | ORAL_TABLET | Freq: Every day | ORAL | Status: DC
  Administered 2023-01-21: 40 mg via ORAL
  Filled 2023-01-20: qty 2

## 2023-01-20 MED ORDER — GABAPENTIN 100 MG PO CAPS
100.0000 mg | ORAL_CAPSULE | Freq: Every day | ORAL | Status: DC
  Administered 2023-01-20 – 2023-01-27 (×8): 100 mg via ORAL
  Filled 2023-01-20 (×8): qty 1

## 2023-01-20 NOTE — Progress Notes (Signed)
Mobility Specialist Progress Note   01/20/23 1542  Mobility  Activity Ambulated with assistance in hallway  Level of Assistance Standby assist, set-up cues, supervision of patient - no hands on  Assistive Device Other (Comment) (HHA)  Distance Ambulated (ft) 210 ft  Activity Response Tolerated well  Mobility Referral Yes  $Mobility charge 1 Mobility   Received pt in hallway ambulating w/o assistance, joined pt d/t swaying gait. No physical assist needed w/ pt guarding rail. Returned back to room w/o fault and all needs met.   Holland Falling Mobility Specialist Please contact via SecureChat or  Rehab office at [phone]

## 2023-01-20 NOTE — Care Management Important Message (Signed)
Important Message  Patient Details  Name: Maureen Jones MRN: [id number] Date of Birth: 1966-11-07   Medicare Important Message Given:  Yes     Saran Laviolette 01/20/2023, 1:52 PM

## 2023-01-20 NOTE — Progress Notes (Signed)
  Progress Note   Patient: Maureen Jones SAY:[id number] DOB: 1966/08/03 DOA: 01/18/2023     2 DOS: the patient was seen and examined on 01/20/2023   Brief hospital course: 57 y.o. 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   Pt presented with sob, streaky blood in sputum, reported fevers. Pt admitted for concerns of volume overload and CAP  Assessment and Plan: Acute on chronic hypoxic respiratory failure -With chronic tracheostomy -chest imaging reviewed. Patchy disease that can be both edema vs PNA -Suspect underlying CAP, given the worsening of productive cough and new fever -CAP coverage with ceftriaxone and azithromycin -Other supportive medication and measures including DuoNebs, guaifenesin, incentive spirometry and flutter valve -Pt was continued on scheduled IV lasix with good diuresis, now on hold secondary to rising Cr   Acute on chronic symptomatic anemia -Probably worsening of her baseline CKD related anemia -Pt s/p 1 unit PRBC -Cont to follow CBC trends   Hemoptysis -Likely related to pneumonia -Seems stable at this time with no further episodes thus far   Acute on chronic HFpEF -Voided very well with IV lasix    AKI on CKD stage V -Significant fluid overload, management as above -Continue bicarb twice daily and Lokelma -Cr now up to 8.63, thus IV lasix on hold -Wt today 104k.8g with dry wt closer to 110-111kg -recheck bmet in AM   Chronic proteinuria -Appears to be in the nephrotic range -ACEI was discontinued November 2023 due to hyperkalemia.   IDDM with insulin resistance -On semglee 18 units bid -Cont SSI   HTN uncontrolled -Continue amlodipine and Coreg -Continue hydralazine 100 mg 3 times daily, add Imdur -Wean off clonidine in 2-3 days, given  the history of CHF   Morbid obesity -BMI=38 -Cut down calorie intake  Abnormal sensorum -Overnight events noted -CT head performed, pending final read -This AM, pt reports symptoms much better  Diabetic neuropathy -Reports significant burning/tingling in B feet worse at night -Will give trial of gabapentin 100mg  qhs -Have ordered trial of topical lidocaine as well  Hypomagnesemia -Replaced      Subjective: States feeling better this AM. Does complain of neuropathy pains that are worse at night  Physical Exam: Vitals:   01/20/23 0815 01/20/23 0907 01/20/23 1023 01/20/23 1635  BP:  (!) 167/63  (!) 160/72  Pulse: 84 88  92  Resp: 20 18    Temp:  99 F (37.2 C)  98.9 F (37.2 C)  TempSrc:  Oral  Oral  SpO2: 99% 100% 100% 100%  Weight:      Height:       General exam: Conversant, in no acute distress Respiratory system: normal chest rise, clear, no audible wheezing Cardiovascular system: regular rhythm, s1-s2 Gastrointestinal system: Nondistended, nontender, pos BS Central nervous system: No seizures, no tremors Extremities: No cyanosis, no joint deformities Skin: No rashes, no pallor Psychiatry: Affect normal // no auditory hallucinations   Data Reviewed:  Labs reviewed: Na 138, K 4.4, Cr 8.63, Mg 1.6  Family Communication: Pt in room, family not at bedside  Disposition: Status is: Inpatient Remains inpatient appropriate because: Severity of illness  Planned Discharge Destination: Home    Author: Marylu Lund, MD 01/20/2023 4:43 PM  For on call review [email or web address].

## 2023-01-20 NOTE — Evaluation (Addendum)
Physical Therapy Evaluation Patient Details Name: Maureen Jones MRN: [id number] DOB: 04-22-1966 Today's Date: 01/20/2023  History of Present Illness  57 y.o. female admitted 2/7 with malaise, shortness of breath, and coughing up blood. Acute on chronic hypoxic respiratory failure, Acute on chronic symptomatic anemia, AKI on CKD stage V,  and Acute on chronic HFpEF.  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  Clinical Impression  Pt admitted with above diagnosis. Up to min assist but mostly at a min guard level for mobility today. Pt will need to progress to mod I for home or arrange additional assist in current state. Hopeful that she will improve and be able to go home with HHPT. Will continue to progress as tolerated during admission. SpO2 92% on room air while working with therapy. 99% on 4L trach collar. Amb with RW up to 70 feet, mild instability with RW for support. Pt currently with functional limitations due to the deficits listed below (see PT Problem List). Pt will benefit from skilled PT to increase their independence and safety with mobility to allow discharge to the venue listed below.       Addendum 01/20/23 1627: Pt observed ambulating with OT in room and hallway with improved stability from initial evaluation. Note from [place] acknowledged and pt is able to arrange additional assist at d/c if needed. Updated discharge recs to [facility]. Will follow and progress.   Recommendations for follow up therapy are one component of a multi-disciplinary discharge planning process, led by the attending physician.  Recommendations may be updated based on patient status, additional functional criteria and insurance authorization.  Follow Up Recommendations Home Health PT Can patient physically be transported by private vehicle: Yes    Assistance Recommended at Discharge Set up  Supervision/Assistance  Patient can return home with the following  A little help with walking and/or transfers;A little help with bathing/dressing/bathroom;Assistance with cooking/housework;Assist for transportation;Help with stairs or ramp for entrance    Equipment Recommendations None recommended by PT  Recommendations for Other Services       Functional Status Assessment Patient has had a recent decline in their functional status and demonstrates the ability to make significant improvements in function in a reasonable and predictable amount of time.     Precautions / Restrictions Precautions Precautions: Fall Precaution Comments: Monitor O2 Restrictions Weight Bearing Restrictions: No      Mobility  Bed Mobility Overal bed mobility: Needs Assistance Bed Mobility: Supine to Sit     Supine to sit: Supervision     General bed mobility comments: Requires extra time, supervision for safety and line/lead management.    Transfers Overall transfer level: Needs assistance Equipment used: Rolling walker (2 wheels) Transfers: Sit to/from Stand Sit to Stand: Min assist           General transfer comment: Light min assist for first attempt. Progressed to min guard with practice. Cues for technique.    Ambulation/Gait Ambulation/Gait assistance: Min guard Gait Distance (Feet): 70 Feet Assistive device: Rolling walker (2 wheels) Gait Pattern/deviations: Step-through pattern, Drifts right/left Gait velocity: slow Gait velocity interpretation: <1.31 ft/sec, indicative of household ambulator   General Gait Details: Slow with intermittent drift and mild instability but able to self correct. Requires short standing rest breaks during bout. SpO2 92% on RA. No buckling, educated on RW use for support.  Stairs  Wheelchair Mobility    Modified Rankin (Stroke Patients Only)       Balance Overall balance assessment: Needs assistance Sitting-balance support:  No upper extremity supported, Feet supported Sitting balance-[name]: Good     Standing balance support: No upper extremity supported, During functional activity Standing balance-[name]: Fair Standing balance comment: able to stand without support. Mild sway arranging items on table                             Pertinent Vitals/Pain Pain Assessment Pain Assessment: No/denies pain    Home Living Family/patient expects to be discharged to:: Private residence (Moving soon.) Living Arrangements: Other relatives Available Help at Discharge: Family;Available PRN/intermittently Type of Home: House Home Access: Stairs to enter Entrance Stairs-Rails: Left Entrance Stairs-Number of Steps: 4   Home Layout: One level Home Equipment: Conservation officer, nature (2 wheels);BSC/3in1 Additional Comments: Plans on moving this month, so situation will be different.    Prior Function Prior Level of Function : Independent/Modified Independent                     Hand Dominance   Dominant Hand: Right    Extremity/Trunk Assessment   Upper Extremity Assessment Upper Extremity Assessment: Defer to OT evaluation    Lower Extremity Assessment Lower Extremity Assessment: Generalized weakness       Communication   Communication: Tracheostomy  Cognition Arousal/Alertness: Awake/alert Behavior During Therapy: WFL for tasks assessed/performed Overall Cognitive Status: Within Functional Limits for tasks assessed                                          General Comments General comments (skin integrity, edema, etc.): SpO2 99% on trach collar 4L, 92% on RA.    Exercises     Assessment/Plan    PT Assessment Patient needs continued PT services  PT Problem List Decreased strength;Decreased activity tolerance;Decreased balance;Decreased mobility;Decreased knowledge of use of DME;Cardiopulmonary status limiting activity;Obesity       PT Treatment  Interventions DME instruction;Gait training;Stair training;Functional mobility training;Therapeutic activities;Therapeutic exercise;Balance training;Neuromuscular re-education;Patient/family education    PT Goals (Current goals can be found in the Care Plan section)  Acute Rehab PT Goals Patient Stated Goal: Get well, move to DC at the end of the month PT Goal Formulation: With patient Time For Goal Achievement: 02/03/23 Potential to Achieve Goals: Good    Frequency Min 3X/week     Co-evaluation               AM-PAC PT "6 Clicks" Mobility  Outcome Measure Help needed turning from your back to your side while in a flat bed without using bedrails?: None Help needed moving from lying on your back to sitting on the side of a flat bed without using bedrails?: None Help needed moving to and from a bed to a chair (including a wheelchair)?: A Little Help needed standing up from a chair using your arms (e.g., wheelchair or bedside chair)?: A Little Help needed to walk in hospital room?: A Little Help needed climbing 3-5 steps with a railing? : A Lot 6 Click Score: 19    End of Session Equipment Utilized During Treatment: Gait belt Activity Tolerance: Patient tolerated treatment well Patient left: in chair;with call bell/phone within reach;with chair alarm set Nurse Communication: Mobility status PT Visit Diagnosis: Unsteadiness on feet (  R26.81);Other abnormalities of gait and mobility (R26.89);Muscle weakness (generalized) (M62.81);Difficulty in walking, not elsewhere classified (R26.2)    Time: [id number] PT Time Calculation (min) (ACUTE ONLY): 26 min   Charges:   PT Evaluation $PT Eval Low Complexity: 1 Low PT Treatments $Gait Training: 8-22 mins        Candie Mile, PT, DPT Physical Therapist Acute Rehabilitation Services [place]   [name] 01/20/2023, 1:52 PM

## 2023-01-20 NOTE — Evaluation (Signed)
Occupational Therapy Evaluation Patient Details Name: Maureen Jones MRN: [id number] DOB: [birth year] Today's Date: 01/20/2023   History of Present Illness 57 y.o. female admitted 2/7 with malaise, shortness of breath, and coughing up blood. Acute on chronic hypoxic respiratory failure, Acute on chronic symptomatic anemia, AKI on CKD stage V,  and Acute on chronic HFpEF.  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   Clinical Impression   Pt functioning modified independently by end of session. She reports fatigue and shortness of breath with LB ADLs and is interested in AE. She reports LB ADLs are more difficult when LEs are edematous.      Recommendations for follow up therapy are one component of a multi-disciplinary discharge planning process, led by the attending physician.  Recommendations may be updated based on patient status, additional functional criteria and insurance authorization.   Follow Up Recommendations  No OT follow up     Assistance Recommended at Discharge PRN  Patient can return home with the following      Functional Status Assessment  Patient has had a recent decline in their functional status and demonstrates the ability to make significant improvements in function in a reasonable and predictable amount of time.  Equipment Recommendations  None recommended by OT    Recommendations for Other Services       Precautions / Restrictions Precautions Precautions: Fall Precaution Comments: Monitor O2 Restrictions Weight Bearing Restrictions: No      Mobility Bed Mobility Overal bed mobility: Modified Independent             General bed mobility comments: HOB up    Transfers Overall transfer level: Modified independent Equipment used: None                      Balance Overall balance assessment: Needs assistance Sitting-balance  support: No upper extremity supported, Feet supported Sitting balance-[name]: Good     Standing balance support: No upper extremity supported, During functional activity Standing balance-[name]: Good                             ADL either performed or assessed with clinical judgement   ADL Overall ADL's : Modified independent                                             Vision Baseline Vision/History: 0 No visual deficits Ability to See in Adequate Light: 0 Adequate Patient Visual Report: No change from baseline       Perception     Praxis      Pertinent Vitals/Pain Pain Assessment Pain Assessment: No/denies pain     Hand Dominance Right   Extremity/Trunk Assessment Upper Extremity Assessment Upper Extremity Assessment: Overall WFL for tasks assessed   Lower Extremity Assessment Lower Extremity Assessment: Defer to PT evaluation   Cervical / Trunk Assessment Cervical / Trunk Assessment: Other exceptions (obesity)   Communication Communication Communication: Tracheostomy   Cognition Arousal/Alertness: Awake/alert Behavior During Therapy: WFL for tasks assessed/performed Overall Cognitive Status: Within Functional Limits for tasks assessed  General Comments  SpO2 99% on trach collar 4L, 92% on RA.    Exercises     Shoulder Instructions      Home Living Family/patient expects to be discharged to:: Private residence Living Arrangements: Other relatives Available Help at Discharge: Family;Available PRN/intermittently Type of Home: House Home Access: Stairs to enter [employer] of Steps: 4 Entrance Stairs-Rails: Left Home Layout: One level     Bathroom Shower/Tub: Teacher, early years/pre: Standard     Home Equipment: Conservation officer, nature (2 wheels);BSC/3in1   Additional Comments: Plans on moving this month, so situation will be different.       Prior Functioning/Environment Prior Level of Function : Independent/Modified Independent                        OT Problem List: Decreased activity tolerance      OT Treatment/Interventions: DME and/or AE instruction;Self-care/ADL training    OT Goals(Current goals can be found in the care plan section) Acute Rehab OT Goals OT Goal Formulation: With patient Time For Goal Achievement: 02/03/23 Potential to Achieve Goals: Good  OT Frequency: Min 2X/week    Co-evaluation              AM-PAC OT "6 Clicks" Daily Activity     Outcome Measure Help from another person eating meals?: None Help from another person taking care of personal grooming?: None Help from another person toileting, which includes using toliet, bedpan, or urinal?: None Help from another person bathing (including washing, rinsing, drying)?: None Help from another person to put on and taking off regular upper body clothing?: None Help from another person to put on and taking off regular lower body clothing?: None 6 Click Score: 24   End of Session Equipment Utilized During Treatment: Gait belt  Activity Tolerance: Patient tolerated treatment well Patient left: in bed;with call bell/phone within reach  OT Visit Diagnosis: Other (comment) (decreased activity tolerance)                Time: [id number] OT Time Calculation (min): 30 min Charges:  OT General Charges $OT Visit: 1 Visit OT Evaluation $OT Eval Low Complexity: 1 Low OT Treatments $Self Care/Home Management : 8-22 mins  Cleta Alberts, OTR/L Acute Rehabilitation Services Office: [phone]   Malka So 01/20/2023, 3:48 PM

## 2023-01-20 NOTE — TOC Progression Note (Signed)
Transition of Care [facility]) - Progression Note    Patient Details  Name: Maureen Jones MRN: [id number] Date of Birth: 1966/03/31  Transition of [facility]) CM/SW Contact  [name], [name], RN Phone Number: 01/20/2023, 4:27 PM  Clinical Narrative:     CM notified by LCSW that patient declined SNF. CM spoke with patient at bedside and she states she prefers going home with Home Health PT and she was active with [facility] before. CM called in referral to [facility] and [facility] declined referral. [name] also declined. [name] does not accept trach patient for home health disciplines.   Patient is on Chronic Trach collar home O2 and gets her supplies from [employer].  Does not have ME's at home, [place] ordered from [facility] to deliver to patient at bedside.  PCP is [place], Charlene Brooke, NP and uses [facility] [address].  Patient requests to use [facility] to transport at discharge.  CM will continue to follow as patient progresses with care towards discharge.         Expected Discharge Plan: [place] Barriers to Discharge: Continued Medical Work up  Expected Discharge Plan and Services lIn-house Referral: Clinical Social Work Discharge Planning Services: CM Consult   Living arrangements for the past 2 months: Single Family Home                 DME Arranged: Kasandra Knudsen DME Agency: [employer] Date DME Agency Contacted: 01/20/23 Time DME Agency Contacted: 1620 Representative spoke with at DME Agency: Erasmo Downer HH Arranged: PT [place] Agency: [facility] Date [place]: 01/20/23 Time [place]: 1622 Representative spoke with at [place]: [place] ([place]) Interventions SDOH Screenings   Transportation Needs: No Transportation Needs (12/21/2022)  Depression (PHQ2-9): Medium Risk (01/02/2023)  Physical Activity: Inactive (12/20/2022)  Stress: Stress Concern Present (01/02/2023)  Tobacco Use: Medium Risk  (11/23/2022)    Readmission Risk Interventions    12/13/2021   12:35 PM 11/15/2021   10:13 AM  Readmission Risk Prevention Plan  Transportation Screening Complete Complete  PCP or Specialist Appt within 3-5 Days Complete Complete  HRI or [place] Complete Complete  Social Work Consult for [place] Planning/Counseling Complete Patient refused  Palliative Care Screening Not Applicable Not Applicable  Medication Review Press photographer) Complete Complete

## 2023-01-20 NOTE — Progress Notes (Signed)
Called by nursing.  Patient has been complaining of generalized tingling all over her face and increased sensation all day.  Patient has repeatedly complained of the sensory changes.  This is new.  Will obtain CT head but doubt TIA or CVA.  Likely less organic cause.  Patient's potassium 4.4 with at the magnesium, phosphorus, TSH and vitamin B12 levels to a.m. labs

## 2023-01-20 NOTE — TOC Initial Note (Signed)
Transition of Care [facility]) - Initial/Assessment Note    Patient Details  Name: Maureen Jones MRN: [id number] Date of Birth: 08-21-1966  Transition of Care [facility]) CM/SW Contact:    Milinda Antis, Erin Phone Number: 01/20/2023, 3:45 PM  Clinical Narrative:                 Physical Therapy recommended SNF placement at time of discharge. CSW spoke with patient at bedside with OT present.  Patient expressed understanding of PT's recommendation and prefers to go home with home health at discharge.  Patient reports that she lives with her god daughter, but if more assistance with mobility and ADLs is needed, patient will stay with sister.  Patient reports that [facility] provided home health services previously and that she would prefer this agency.  Patient request a cane to assist with balance when walking up steps to get into the home.  TOC following.    Expected Discharge Plan: [place] Barriers to Discharge: Continued Medical Work up   Patient Goals and CMS Choice Patient states their goals for this hospitalization and ongoing recovery are:: To return home CMS Medicare.gov Compare Post Acute Care list provided to:: Patient Choice offered to / list presented to : Patient      Expected Discharge Plan and Services In-house Referral: Clinical Social Work     Living arrangements for the past 2 months: Single Family Home                                      Prior Living Arrangements/Services Living arrangements for the past 2 months: Single Family Home Lives with:: Other (Comment) (god-daughter) Patient language and need for interpreter reviewed:: Yes        Need for Family Participation in Patient Care: Yes (Comment) Care giver support system in place?: Yes (comment)   Criminal Activity/Legal Involvement Pertinent to Current Situation/Hospitalization: No - Comment as needed  Activities of Daily Living Home Assistive Devices/Equipment: None ADL  Screening (condition at time of admission) Patient's cognitive ability adequate to safely complete daily activities?: Yes Is the patient deaf or have difficulty hearing?: No Does the patient have difficulty seeing, even when wearing glasses/contacts?: No Does the patient have difficulty concentrating, remembering, or making decisions?: No Patient able to express need for assistance with ADLs?: Yes Does the patient have difficulty dressing or bathing?: No Independently performs ADLs?: Yes (appropriate for developmental age) Does the patient have difficulty walking or climbing stairs?: No Weakness of Legs: None Weakness of Arms/Hands: None  Permission Sought/Granted   Permission granted to share information with : Yes, Verbal Permission Granted     Permission granted to share info w AGENCY: DME and Home health agencies        Emotional Assessment Appearance:: Appears stated age Attitude/Demeanor/Rapport: Engaged, Self-Confident Affect (typically observed): Accepting Orientation: : Oriented to Situation, Oriented to  Time, Oriented to Place, Oriented to Self Alcohol / Substance Use: Not Applicable Psych Involvement: No (comment)  Admission diagnosis:  CAP (community acquired pneumonia) [J18.9] Hemoptysis [R04.2] Symptomatic anemia [D64.9] Patient Active Problem List   Diagnosis Date Noted   CAP (community acquired pneumonia) 01/18/2023   Sinusitis 12/01/2022   Pressure injury due to medical device 12/01/2022   AKI (acute kidney injury) ([place]) 10/05/2022   LPRD (laryngopharyngeal reflux disease) 09/27/2022   Morbid (severe) obesity due to excess calories ([place]) 07/22/2022   Difficulty with speech  07/05/2022   Tracheostomy status ([place])    Subglottic stenosis    Nausea & vomiting 02/13/2022   CAP (community acquired pneumonia) due to Pneumococcus ([place]) 12/06/2021   CKD (chronic kidney disease) stage 5, GFR less than 15 ml/min ([place]) 11/22/2021   Mood disorder ([place]) 11/22/2021    Tracheostomy dependence ([place])    Subglottic edema    Vocal cord dysfunction    Endotracheal tube present    Bilateral carpal tunnel syndrome 11/15/2019   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   Insomnia 11/15/2019   Mild intermittent asthma without complication [id number]   OSA (obstructive sleep apnea) 11/15/2019   Seasonal allergic rhinitis 11/15/2019   Tobacco abuse 11/15/2019   DM (diabetes mellitus) ([place]) 11/15/2019   PCP:  Flossie Buffy, NP [id number] [place] [place] [address] [address]Phone: [phone]:[phone]    Social Determinants of Health (SDOH) Social History: SDOH Screenings   Transportation Needs: No Transportation Needs (12/21/2022)  Depression (PHQ2-9): Medium Risk (01/02/2023)  Physical Activity: Inactive (12/20/2022)  Stress: Stress Concern Present (01/02/2023)  Tobacco Use: Medium Risk (11/23/2022)   SDOH Interventions:     Readmission Risk Interventions    12/13/2021   12:35 PM 11/15/2021   10:13 AM  Readmission Risk Prevention Plan  Transportation Screening Complete Complete  PCP or Specialist Appt within 3-5 Days Complete Complete  [place]RI or [place] Complete  Social Work Consult for R[place]anning/Counseling Complete Patient refused  Palliative Care Screening Not Applicable Not Applicable  Medication Review (Press photographer Complete Complete

## 2023-01-20 NOTE — Inpatient Diabetes Management (Signed)
Inpatient Diabetes Program Recommendations  AACE/ADA: New Consensus Statement on Inpatient Glycemic Control (2015)  Target Ranges:  Prepandial:   less than 140 mg/dL      Peak postprandial:   less than 180 mg/dL (1-2 hours)      Critically ill patients:  140 - 180 mg/dL   Lab Results  Component Value Date   GLUCAP 198 (H) 01/20/2023   HGBA1C 7.6 (H) 01/18/2023    Review of Glycemic Control  Latest Reference Range & Units 01/19/23 07:27 01/19/23 11:35 01/19/23 16:47 01/19/23 21:26 01/20/23 08:41  Glucose-Capillary 70 - 99 mg/dL 168 (H) 192 (H) 211 (H) 198 (H) 198 (H)   Diabetes history: DM type 2 Outpatient Diabetes medications: Tresiba 18 units bid (32 units bid prescribed) Current orders for Inpatient glycemic control:  Semglee 18 units bid Novolog 0-9 units tid  A1c 7.6% on 2/7  Inpatient Diabetes Program Recommendations:    Glucose trends increase after meal intake  -  Consider adding Novolog 2 units tid meal coverage if eating >50% of meals.  Thanks,  Tama Headings RN, MSN, BC-ADM Inpatient Diabetes Coordinator Team Pager [phone] (8a-5p)

## 2023-01-21 DIAGNOSIS — R042 Hemoptysis: Secondary | ICD-10-CM | POA: Diagnosis not present

## 2023-01-21 DIAGNOSIS — D649 Anemia, unspecified: Secondary | ICD-10-CM | POA: Diagnosis not present

## 2023-01-21 LAB — COMPREHENSIVE METABOLIC PANEL
ALT: 10 U/L (ref 0–44)
AST: 12 U/L — ABNORMAL LOW (ref 15–41)
Albumin: 2.1 g/dL — ABNORMAL LOW (ref 3.5–5.0)
Alkaline Phosphatase: 105 U/L (ref 38–126)
Anion gap: 15 (ref 5–15)
BUN: 76 mg/dL — ABNORMAL HIGH (ref 6–20)
CO2: 20 mmol/L — ABNORMAL LOW (ref 22–32)
Calcium: 7.7 mg/dL — ABNORMAL LOW (ref 8.9–10.3)
Chloride: 104 mmol/L (ref 98–111)
Creatinine, Ser: 8.38 mg/dL — ABNORMAL HIGH (ref 0.44–1.00)
GFR, Estimated: 5 mL/min — ABNORMAL LOW (ref >60)
Glucose, Bld: 128 mg/dL — ABNORMAL HIGH (ref 70–99)
Potassium: 4.2 mmol/L (ref 3.5–5.1)
Sodium: 139 mmol/L (ref 135–145)
Total Bilirubin: 0.8 mg/dL (ref 0.3–1.2)
Total Protein: 6 g/dL — ABNORMAL LOW (ref 6.5–8.1)

## 2023-01-21 LAB — GLUCOSE, CAPILLARY
Glucose-Capillary: 169 mg/dL — ABNORMAL HIGH (ref 70–99)
Glucose-Capillary: 237 mg/dL — ABNORMAL HIGH (ref 70–99)
Glucose-Capillary: 205 mg/dL — ABNORMAL HIGH (ref 70–99)

## 2023-01-21 LAB — MAGNESIUM: Magnesium: 2.3 mg/dL (ref 1.7–2.4)

## 2023-01-21 LAB — CBC
HCT: 22.2 % — ABNORMAL LOW (ref 36.0–46.0)
Hemoglobin: 7.2 g/dL — ABNORMAL LOW (ref 12.0–15.0)
MCH: 29.5 pg (ref 26.0–34.0)
MCHC: 32.4 g/dL (ref 30.0–36.0)
MCV: 91 fL (ref 80.0–100.0)
Platelets: 268 10*3/uL (ref 150–400)
RBC: 2.44 MIL/uL — ABNORMAL LOW (ref 3.87–5.11)
RDW: 13.1 % (ref 11.5–15.5)
WBC: 12.4 10*3/uL — ABNORMAL HIGH (ref 4.0–10.5)
nRBC: 0 % (ref 0.0–0.2)

## 2023-01-21 MED ORDER — FUROSEMIDE 10 MG/ML IJ SOLN
80.0000 mg | Freq: Two times a day (BID) | INTRAMUSCULAR | Status: DC
  Administered 2023-01-21 – 2023-01-22 (×3): 80 mg via INTRAVENOUS
  Filled 2023-01-21 (×3): qty 8

## 2023-01-21 NOTE — Progress Notes (Signed)
  Progress Note   Patient: Maureen Jones HEN:[id number] DOB: 21-Dec-1965 DOA: 01/18/2023     3 DOS: the patient was seen and examined on 01/21/2023   Brief hospital course: 57 y.o. 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   Pt presented with sob, streaky blood in sputum, reported fevers. Pt admitted for concerns of volume overload and CAP  Assessment and Plan: Acute on chronic hypoxic respiratory failure -With chronic tracheostomy -chest imaging reviewed. Patchy disease that can be both edema vs PNA -Suspect underlying CAP, given the worsening of productive cough and new fever -CAP coverage with ceftriaxone and azithromycin -Other supportive medication and measures including DuoNebs, guaifenesin, incentive spirometry and flutter valve -Pt was continued on scheduled IV lasix with good diuresis,now back on home torsemide -Wean o2 to pt's baseline requirements of 4L as tolerated   Acute on chronic symptomatic anemia -Probably worsening of her baseline CKD related anemia -Pt s/p 1 unit PRBC -Hgb seems stable at this time, following CBC trends   Hemoptysis -Likely related to pneumonia -No further episodes   Acute on chronic HFpEF -Voided very well with IV lasix    AKI on CKD stage V -Significant fluid overload, management as above -Continue bicarb twice daily and Lokelma -Cr peaked to 8.63 with IV lasix on hold, resumed home torsemide -Most recent wt of 104k.8g with dry wt closer to 110-111kg -Cr remains over baseline, currently 8.3. Have consulted nephrology   Chronic proteinuria -Appears to be in the nephrotic range -ACEI was discontinued November 2023 due to hyperkalemia.   IDDM with insulin resistance -On semglee 18 units bid -Cont SSI   HTN uncontrolled -Continue  amlodipine and Coreg -Continue hydralazine 100 mg 3 times daily, add Imdur -Wean off clonidine in 2-3 days, given the history of CHF   Morbid obesity -BMI=38 -Cut down calorie intake  Abnormal sensorum -Recent events noted -CT head performed, neg -On further questioning, pt described feeling "foggy headed" -suspect related to renal disease  Diabetic neuropathy -Reports significant burning/tingling in B feet worse at night -giving trial of gabapentin 100mg  qhs -Have ordered trial of topical lidocaine as well  Hypomagnesemia -Replaced      Subjective: Still having abnormal sensorum described as foggy headed feeling.   Physical Exam: Vitals:   01/20/23 2335 01/21/23 0259 01/21/23 0517 01/21/23 1202  BP:   (!) 158/76 (!) 165/83  Pulse: 79 78 81 93  Resp:   18 20  Temp:   98.6 F (37 C)   TempSrc:   Oral   SpO2: 98% 97% 97% 97%  Weight:      Height:       General exam: Awake, laying in bed, in nad Respiratory system: Normal respiratory effort, no wheezing Cardiovascular system: regular rate, s1, s2 Gastrointestinal system: Soft, nondistended, positive BS Central nervous system: CN2-12 grossly intact, strength intact Extremities: Perfused, no clubbing Skin: Normal skin turgor, no notable skin lesions seen Psychiatry: Mood normal // no visual hallucinations   Data Reviewed:  Labs reviewed: na 139, K 4.2, Cr 8.38, WBC 12.4, Hgb 7.2  Family Communication: Pt in room, family not at bedside  Disposition: Status is: Inpatient Remains inpatient appropriate because: Severity of illness  Planned Discharge Destination: Home    Author: Marylu Lund, MD 01/21/2023 1:48 PM  For on call review [email or web address].

## 2023-01-21 NOTE — Consult Note (Signed)
Renal Service Consult Note [facility]  Maureen Jones 01/21/2023 Sol Blazing, MD Requesting Physician: Dr. Wyline Copas Reason for Consult: Renal failure HPI: The patient is a 57 y.o. year-old w/ PMH as below who presented on 2/07 for feeling "sick" for 2 days w/ cough and SOB. Also increasing LE edema and 20 lb wt gain. In ED  CXR showed atelectasis and poss mild congestion. Hb 6.8, creat 8.4 (7.8 last month). K+ 4.6. Pt has known advanced CKD f/b Dr Hollie Salk of [place] and has not required dialysis yet. She has chronic tracheostomy which may complicate outpatient HD. Pt was admitted for A/C hypoxic RF likely due to CAP and/or vol overload. She was given IV abx and IV lasix w/ decent UOP of 2900 cc over 2.5 days and wts are down 9 lbs. IV lasix was held today w/ bump in creatinine. We are asked to see for renal failure.   Pt seen in room, still SOB a bit w/ DOE. No n/v or confusion or jerking of extremities.     ROS - denies CP, no joint pain, no HA, no blurry vision, no rash, no diarrhea, no nausea/ vomiting, no dysuria, no difficulty voiding   Past Medical History  Past Medical History:  Diagnosis Date   Acute respiratory failure with hypoxemia ([place]) 12/07/2021   Acute respiratory failure with hypoxia ([place]) 12/07/2021   Asthma    Colitis    Diabetes ([place])    Encephalopathy acute 09/20/2021   Glottic and subglottic edema 2022   Hypertension    Past Surgical History  Past Surgical History:  Procedure Laterality Date   TRACHEOSTOMY REVISION  12/16/2021   Procedure: TRACHEOSTOMY EXCHANGE;  Surgeon: Juanito Doom, MD;  Location: [facility] ENDOSCOPY;  Service: Cardiopulmonary;;   Family History  Family History  Family history unknown: Yes   Social History  reports that she has quit smoking. Her smoking use included cigarettes. She has never used smokeless tobacco. She reports that she does not drink alcohol and does not use drugs. Allergies No Known Allergies Home  medications Prior to Admission medications   Medication Sig Start Date End Date Taking? Authorizing Provider  amLODipine (NORVASC) 10 MG tablet Take 1 tablet (10 mg total) by mouth at bedtime. 04/01/22  Yes Nche, Charlene Brooke, NP  atorvastatin (LIPITOR) 20 MG tablet Take 1 tablet (20 mg total) by mouth daily. 04/01/22  Yes Nche, Charlene Brooke, NP  azelastine (ASTELIN) 0.1 % nasal spray Place 2 sprays into both nostrils 2 (two) times daily.   Yes [provider]  carvedilol (COREG) 25 MG tablet Take 1 tablet (25 mg total) by mouth 2 (two) times daily. 04/01/22  Yes Nche, Charlene Brooke, NP  cloNIDine (CATAPRES) 0.3 MG tablet Take 1 tablet (0.3 mg total) by mouth 3 (three) times daily. 11/02/22  Yes Nche, Charlene Brooke, NP  Continuous Blood Gluc Sensor (FREESTYLE LIBRE 14 DAY SENSOR) MISC 1 Units by Does not apply route every 14 (fourteen) days. 07/04/22  Yes Nche, Charlene Brooke, NP  Cyanocobalamin (VITAMIN B12 PO) Take 1 tablet by mouth daily.   Yes [provider]  DULoxetine (CYMBALTA) 30 MG capsule Take 1 capsule (30 mg total) by mouth daily. 11/02/22  Yes Nche, Charlene Brooke, NP  EXCEDRIN MIGRAINE [id number] MG tablet Take 1 tablet by mouth every 6 (six) hours as needed for headache or migraine (or pain).   Yes [provider]  famotidine (PEPCID) 20 MG tablet Take 1 tablet (20 mg total) by mouth at  bedtime. 11/10/22  Yes Nche, Charlene Brooke, NP  fluticasone (FLONASE) 50 MCG/ACT nasal spray Place 2 sprays into both nostrils daily. Patient taking differently: Place 1-2 sprays into both nostrils 2 (two) times daily. 04/12/22  Yes Nche, Charlene Brooke, NP  Humidifier MISC 1 Device as directed.   Yes [provider]  hydrALAZINE (APRESOLINE) 100 MG tablet Take 1 tablet (100 mg total) by mouth 3 (three) times daily. 04/01/22  Yes Nche, Charlene Brooke, NP  levocetirizine (XYZAL) 5 MG tablet Take 1 tablet (5 mg total) by mouth every evening. 04/12/22  Yes Nche, Charlene Brooke, NP   OXYGEN Inhale 4 L/min into the lungs at bedtime.   Yes [provider]  pantoprazole (PROTONIX) 40 MG tablet Take 1 tablet (40 mg total) by mouth daily. Patient taking differently: Take 40 mg by mouth daily before breakfast. 11/14/22  Yes Nche, Charlene Brooke, NP  promethazine (PHENERGAN) 25 MG tablet Take 1 tablet (25 mg total) by mouth every 8 (eight) hours as needed for nausea or vomiting. 10/27/22  Yes Nche, Charlene Brooke, NP  sodium bicarbonate 650 MG tablet Take 1 tablet (650 mg total) by mouth 2 (two) times daily. 11/02/22  Yes Nche, Charlene Brooke, NP  torsemide (DEMADEX) 20 MG tablet Take 40 mg by mouth in the morning.   Yes [provider]  traZODone (DESYREL) 50 MG tablet Take 1 tablet (50 mg total) by mouth at bedtime. 07/22/22  Yes Nche, Charlene Brooke, NP  TRESIBA FLEXTOUCH 200 UNIT/ML FlexTouch Pen Inject 18 Units into the skin 2 (two) times daily. Patient taking differently: Inject 32 Units into the skin 2 (two) times daily. 10/14/22  Yes Vann, Jessica U, DO  VITAMIN E PO Take 1 capsule by mouth daily.   Yes [provider]  glucose blood (ONETOUCH VERIO) test strip Use as instructed 04/01/22   Nche, Charlene Brooke, NP  Insulin Pen Needle 32G X 4 MM MISC Use to inject insulin up to 4 times daily as directed 04/01/22   Nche, Charlene Brooke, NP  sodium polystyrene (KAYEXALATE) 15 GM/60ML suspension Take 60 mLs (15 g total) by mouth daily. Patient not taking: Reported on 01/19/2023 10/14/22   Geradine Girt, DO  insulin aspart (NOVOLOG) 100 UNIT/ML injection Insulin (Novolog) sliding scale for additional Novolog: administer 6mnutes before meals. Sugar <150 - no units Sugar >151-200 - 4 units Sugar >200-250 - 6 units Sugar >251-300 - 10 units and call office. Patient taking differently: Inject 4-10 Units into the skin See admin instructions. Insulin (Novolog) sliding scale for additional Novolog: administer 549mutes before meals. Sugar <150 - no units Sugar >151-200  - 4 units Sugar >200-250 - 6 units Sugar >251-300 - 10 units and call office. 11/22/21 12/17/21  NcFlossie BuffyNP     Vitals:   01/21/23 0517 01/21/23 1202 01/21/23 1621 01/21/23 2031  BP: (!) 158/76 (!) 165/83 (!) 170/92 (!) 160/72  Pulse: 81 93 87 82  Resp: 18 20 18 18  $ Temp: 98.6 F (37 C)  98 F (36.7 C) 98 F (36.7 C)  TempSrc: Oral   Oral  SpO2: 97% 97% 98% 98%  Weight:      Height:       Exam Gen alert, no distress No rash, cyanosis or gangrene Sclera anicteric, throat clear  No jvd or bruits Chest clear bilat to bases, no rales/ wheezing RRR no MRG Abd soft ntnd no mass or ascites +bs GU defer MS no joint effusions or deformity Ext 1+ pretib  pitting bilat LE edema Neuro is alert, Ox 3 , nf    Home meds include - norvasc 10, lipitor, coreg 25 bid, clonidine 0.3 tid, cymbalta, pepcid, hydralazine 100 tid, protonix, sod bicarb, trazodone, tresiba insulin, prns/ vits/ supps    UA - pending    UNa, UCr pending     B/l creatinine from oct 2023 - jan 2024 is 6.8 - 7.5, eGFR 6- 7 ml/min      Na 139  K 4.2 CO2 20       Creat 8.49 -> 8.06 -> 8.63 -> 8.38 today      BUN   74- > 73 -> 76 -> 76 today   Assessment/ Plan: AKI on CKD 5 - B/l creatinine from oct 2023 - jan 2024 is 6.8 - 7.5, eGFR 6- 7 ml/min. Creat here was 8.4 on admission in setting of AHRF due to CAP and/or vol overload. CT showed diffuse GG changes. Pt was diuresed for 2 days w/ good response, still 11 lbs above usual wt. Pt's renal function is failing and she will need HD soon most likely, but not today. She is awake and alert w/o any sig uremic signs. BP's good. Will restart the IV lasix 80 bid and try and get more fluid off. Will follow.  A/C hypoxic resp failure - getting IV abx for CAP and IV lasix for vol overload Chron resp failure / chronic trach-dependent IDDM - per pmd Uncont HTN - better      Kelly Splinter  MD [facility] 01/21/2023, 8:52 PM  Recent Labs  Lab 01/20/23 0125 01/20/23 0529  01/21/23 0258  HGB 7.0*  --  7.2*  ALBUMIN 2.1*  --  2.1*  CALCIUM 7.5*  --  7.7*  PHOS  --  6.9*  --   CREATININE 8.63*  --  8.38*  K 4.4  --  4.2   Inpatient medications:  amLODipine  10 mg Oral QHS   atorvastatin  20 mg Oral Daily   azelastine  2 spray Each Nare BID   carvedilol  25 mg Oral BID WC   cetirizine  5 mg Oral QPM   cloNIDine  0.2 mg Oral TID   DULoxetine  30 mg Oral Daily   famotidine  20 mg Oral QHS   ferrous sulfate  325 mg Oral Q breakfast   fluticasone  2 spray Each Nare Daily   furosemide  80 mg Intravenous BID   gabapentin  100 mg Oral QHS   guaiFENesin  1,200 mg Oral BID   hydrALAZINE  100 mg Oral TID   insulin aspart  0-9 Units Subcutaneous TID WC   insulin glargine-yfgn  18 Units Subcutaneous BID   isosorbide mononitrate  30 mg Oral Daily   pantoprazole  40 mg Oral Daily   sodium bicarbonate  650 mg Oral BID   sodium polystyrene  15 g Oral Daily   traZODone  50 mg Oral QHS    sodium chloride     azithromycin 500 mg (01/21/23 1636)   cefTRIAXone (ROCEPHIN)  IV 2 g (01/21/23 1744)   sodium chloride, ipratropium-albuterol, labetalol, lidocaine, promethazine

## 2023-01-21 NOTE — Progress Notes (Signed)
Occupational Therapy Treatment Patient Details Name: Maureen Jones MRN: [id number] DOB: Jul 16, 1966 Today's Date: 01/21/2023   History of present illness 57 y.o. female admitted 2/7 with malaise, shortness of breath, and coughing up blood. Acute on chronic hypoxic respiratory failure, Acute on chronic symptomatic anemia, AKI on CKD stage V,  and Acute on chronic HFpEF.  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   OT comments  Pt is making excellent progress and is likely near her functional baseline. Generalized superivsion A provided throughout but pt demonstrated mod I functional mobility, toileting, grooming, and LB ADLs. Educated pt on AE for LB ADLs for energy conservation, she verbalized great understanding - hand out given with information on where to purchase. OT to continue to follow acutely. POC remains appropriate.    Recommendations for follow up therapy are one component of a multi-disciplinary discharge planning process, led by the attending physician.  Recommendations may be updated based on patient status, additional functional criteria and insurance authorization.    Follow Up Recommendations  No OT follow up     Assistance Recommended at Discharge PRN     Equipment Recommendations  None recommended by OT       Precautions / Restrictions Precautions Precautions: Fall Precaution Comments: Monitor O2, trach Restrictions Weight Bearing Restrictions: No       Mobility Bed Mobility Overal bed mobility: Modified Independent Bed Mobility: Supine to Sit, Sit to Supine                Transfers Overall transfer level: Needs assistance Equipment used: None Transfers: Sit to/from Stand Sit to Stand: Supervision                 Balance Overall balance assessment: Needs assistance Sitting-balance support: No upper extremity supported, Feet  supported Sitting balance-[name]: Good     Standing balance support: No upper extremity supported, During functional activity Standing balance-[name]: Good                             ADL either performed or assessed with clinical judgement   ADL Overall ADL's : Modified independent                                       General ADL Comments: supervision A initially for safety, pt demonstrated mod I toileting, grooming, LB bathing and LB ADLs with good dunderstanding of AE    Extremity/Trunk Assessment Upper Extremity Assessment Upper Extremity Assessment: Overall WFL for tasks assessed   Lower Extremity Assessment Lower Extremity Assessment: Defer to PT evaluation        Vision   Vision Assessment?: No apparent visual deficits   Perception Perception Perception: Not tested   Praxis Praxis Praxis: Not tested    Cognition Arousal/Alertness: Awake/alert Behavior During Therapy: WFL for tasks assessed/performed Overall Cognitive Status: Within Functional Limits for tasks assessed                      General Comments VSS on RA    Pertinent Vitals/ Pain       Pain Assessment Pain Assessment: No/denies pain   Frequency  Min 2X/week        Progress Toward Goals  OT Goals(current goals can now be found in  the care plan section)  Progress towards OT goals: Progressing toward goals  Acute Rehab OT Goals OT Goal Formulation: With patient Time For Goal Achievement: 02/03/23 Potential to Achieve Goals: Good ADL Goals Additional ADL Goal #1: Pt will be knowledgeable in use of AE for LB ADLs.  Plan Discharge plan remains appropriate       AM-PAC OT "6 Clicks" Daily Activity     Outcome Measure   Help from another person eating meals?: None Help from another person taking care of personal grooming?: None Help from another person toileting, which includes using toliet, bedpan, or urinal?: None Help from another  person bathing (including washing, rinsing, drying)?: None Help from another person to put on and taking off regular upper body clothing?: None Help from another person to put on and taking off regular lower body clothing?: None 6 Click Score: 24    End of Session    OT Visit Diagnosis: Other (comment)   Activity Tolerance Patient tolerated treatment well   Patient Left in bed;with call bell/phone within reach   Nurse Communication Mobility status        Time: 1352-1415 OT Time Calculation (min): 23 min  Charges: OT General Charges $OT Visit: 1 Visit OT Treatments $Self Care/Home Management : 23-37 mins  Shade Flood, OTR/L [place] Office [phone] Secure Chat Communication Preferred   Elliot Cousin 01/21/2023, 3:41 PM

## 2023-01-21 NOTE — Progress Notes (Signed)
Physical Therapy Treatment Patient Details Name: Maureen Jones MRN: [id number] DOB: 01-Jan-1966 Today's Date: 01/21/2023   History of Present Illness 57 y.o. female admitted 2/7 with malaise, shortness of breath, and coughing up blood. Acute on chronic hypoxic respiratory failure, Acute on chronic symptomatic anemia, AKI on CKD stage V,  and Acute on chronic HFpEF.  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    PT Comments    Good progress since treatment yesterday. Has worked with OT and been up with mobility techs. Still shows some instability with gait and static balance. Feel she has improved to a level where intermittent supervision from family and HHPT will be adequate for patient to continue her progress with functional recovery. Reviewed some balance and strengthening exercises with pt to perform at home. Will benefit from [facility]. Patient will continue to benefit from skilled physical therapy services to further improve independence with functional mobility.    Recommendations for follow up therapy are one component of a multi-disciplinary discharge planning process, led by the attending physician.  Recommendations may be updated based on patient status, additional functional criteria and insurance authorization.  Follow Up Recommendations  Home health PT Can patient physically be transported by private vehicle: Yes   Assistance Recommended at Discharge Set up Supervision/Assistance  Patient can return home with the following A little help with walking and/or transfers;A little help with bathing/dressing/bathroom;Assistance with cooking/housework;Assist for transportation;Help with stairs or ramp for entrance   Equipment Recommendations  Cane    Recommendations for Other Services       Precautions / Restrictions Precautions Precautions: Fall Precaution Comments: Monitor  O2 Restrictions Weight Bearing Restrictions: No     Mobility  Bed Mobility Overal bed mobility: Modified Independent Bed Mobility: Supine to Sit     Supine to sit: Supervision     General bed mobility comments: HOB up    Transfers Overall transfer level: Needs assistance Equipment used: None Transfers: Sit to/from Stand Sit to Stand: Supervision           General transfer comment: Supervision for safety, minor instability,able to self correct    Ambulation/Gait Ambulation/Gait assistance: Supervision Gait Distance (Feet): 125 Feet Assistive device: None Gait Pattern/deviations: Step-through pattern, Drifts right/left, Staggering left Gait velocity: slow Gait velocity interpretation: <1.31 ft/sec, indicative of household ambulator   General Gait Details: Slight instability with left lateral sway intermittently but able to self correct. No overt LOB, or buckling today. Ambulating in halls with supervision for safety, cues for symmetry and awareness.   Stairs             Wheelchair Mobility    Modified Rankin (Stroke Patients Only)       Balance Overall balance assessment: Needs assistance Sitting-balance support: No upper extremity supported, Feet supported Sitting balance-[name]: Good     Standing balance support: No upper extremity supported, During functional activity Standing balance-[name]: Good Standing balance comment: able to stand without support. Mild sway arranging items on table                            Cognition Arousal/Alertness: Awake/alert Behavior During Therapy: WFL for tasks assessed/performed Overall Cognitive Status: Within Functional Limits for tasks assessed  Exercises General Exercises - Lower Extremity Ankle Circles/Pumps: AROM, Both, 20 reps, Seated Long Arc Quad: Strengthening, Both, 10 reps, Seated Other Exercises Other Exercises:  Standing mini quats, unsupported, tactile cues for alignment Other Exercises: Standing balance, semi-tandem stance, trunk and head rotation, eyes open, eyes closed.    General Comments        Pertinent Vitals/Pain Pain Assessment Pain Assessment: No/denies pain    Home Living                          Prior Function            PT Goals (current goals can now be found in the care plan section) Acute Rehab PT Goals Patient Stated Goal: Get well, move to DC at the end of the month PT Goal Formulation: With patient Time For Goal Achievement: 02/03/23 Potential to Achieve Goals: Good Progress towards PT goals: Progressing toward goals    Frequency    Min 3X/week      PT Plan Discharge plan needs to be updated;Equipment recommendations need to be updated    Co-evaluation              AM-PAC PT "6 Clicks" Mobility   Outcome Measure  Help needed turning from your back to your side while in a flat bed without using bedrails?: None Help needed moving from lying on your back to sitting on the side of a flat bed without using bedrails?: None Help needed moving to and from a bed to a chair (including a wheelchair)?: A Little Help needed standing up from a chair using your arms (e.g., wheelchair or bedside chair)?: A Little Help needed to walk in hospital room?: A Little Help needed climbing 3-5 steps with a railing? : A Lot 6 Click Score: 19    End of Session Equipment Utilized During Treatment: Gait belt Activity Tolerance: Patient tolerated treatment well Patient left: in chair;with call bell/phone within reach;with chair alarm set Nurse Communication: Mobility status PT Visit Diagnosis: Unsteadiness on feet (R26.81);Other abnormalities of gait and mobility (R26.89);Muscle weakness (generalized) (M62.81);Difficulty in walking, not elsewhere classified (R26.2)     Time: [id number] PT Time Calculation (min) (ACUTE ONLY): 22 min  Charges:  $Gait Training:  8-22 mins                     Maureen Jones, PT, DPT Physical Therapist Acute Rehabilitation Services [place] [facility]    Maureen Jones 01/21/2023, 1:18 PM

## 2023-01-22 ENCOUNTER — Inpatient Hospital Stay (HOSPITAL_COMMUNITY): Payer: Medicare Other

## 2023-01-22 DIAGNOSIS — R042 Hemoptysis: Secondary | ICD-10-CM | POA: Diagnosis not present

## 2023-01-22 DIAGNOSIS — D649 Anemia, unspecified: Secondary | ICD-10-CM | POA: Diagnosis not present

## 2023-01-22 LAB — COMPREHENSIVE METABOLIC PANEL
ALT: 11 U/L (ref 0–44)
AST: 13 U/L — ABNORMAL LOW (ref 15–41)
Albumin: 2.2 g/dL — ABNORMAL LOW (ref 3.5–5.0)
Alkaline Phosphatase: 117 U/L (ref 38–126)
Anion gap: 15 (ref 5–15)
BUN: 77 mg/dL — ABNORMAL HIGH (ref 6–20)
CO2: 19 mmol/L — ABNORMAL LOW (ref 22–32)
Calcium: 8 mg/dL — ABNORMAL LOW (ref 8.9–10.3)
Chloride: 105 mmol/L (ref 98–111)
Creatinine, Ser: 8.1 mg/dL — ABNORMAL HIGH (ref 0.44–1.00)
GFR, Estimated: 5 mL/min — ABNORMAL LOW (ref >60)
Glucose, Bld: 165 mg/dL — ABNORMAL HIGH (ref 70–99)
Potassium: 3.4 mmol/L — ABNORMAL LOW (ref 3.5–5.1)
Sodium: 139 mmol/L (ref 135–145)
Total Bilirubin: 0.9 mg/dL (ref 0.3–1.2)
Total Protein: 6.4 g/dL — ABNORMAL LOW (ref 6.5–8.1)

## 2023-01-22 LAB — URINALYSIS, ROUTINE W REFLEX MICROSCOPIC
Bilirubin Urine: NEGATIVE
Glucose, UA: 150 mg/dL — AB
Hgb urine dipstick: NEGATIVE
Ketones, ur: NEGATIVE mg/dL
Leukocytes,Ua: NEGATIVE
Nitrite: NEGATIVE
Protein, ur: >=300 mg/dL — AB
Specific Gravity, Urine: 1.012 (ref 1.005–1.030)
pH: 5 (ref 5.0–8.0)

## 2023-01-22 LAB — CBC
HCT: 24.7 % — ABNORMAL LOW (ref 36.0–46.0)
Hemoglobin: 7.8 g/dL — ABNORMAL LOW (ref 12.0–15.0)
MCH: 28.5 pg (ref 26.0–34.0)
MCHC: 31.6 g/dL (ref 30.0–36.0)
MCV: 90.1 fL (ref 80.0–100.0)
Platelets: 300 10*3/uL (ref 150–400)
RBC: 2.74 MIL/uL — ABNORMAL LOW (ref 3.87–5.11)
RDW: 13.2 % (ref 11.5–15.5)
WBC: 15.2 10*3/uL — ABNORMAL HIGH (ref 4.0–10.5)
nRBC: 0 % (ref 0.0–0.2)

## 2023-01-22 LAB — MAGNESIUM: Magnesium: 2.1 mg/dL (ref 1.7–2.4)

## 2023-01-22 LAB — GLUCOSE, CAPILLARY
Glucose-Capillary: 67 mg/dL — ABNORMAL LOW (ref 70–99)
Glucose-Capillary: 124 mg/dL — ABNORMAL HIGH (ref 70–99)
Glucose-Capillary: 167 mg/dL — ABNORMAL HIGH (ref 70–99)
Glucose-Capillary: 126 mg/dL — ABNORMAL HIGH (ref 70–99)
Glucose-Capillary: 258 mg/dL — ABNORMAL HIGH (ref 70–99)

## 2023-01-22 LAB — SODIUM, URINE, RANDOM: Sodium, Ur: 93 mmol/L

## 2023-01-22 LAB — CREATININE, URINE, RANDOM: Creatinine, Urine: 58 mg/dL

## 2023-01-22 MED ORDER — FUROSEMIDE 10 MG/ML IJ SOLN
120.0000 mg | Freq: Three times a day (TID) | INTRAVENOUS | Status: DC
  Administered 2023-01-22 – 2023-01-25 (×8): 120 mg via INTRAVENOUS
  Filled 2023-01-22 (×2): qty 10
  Filled 2023-01-22: qty 2
  Filled 2023-01-22: qty 10
  Filled 2023-01-22: qty 2
  Filled 2023-01-22 (×2): qty 12
  Filled 2023-01-22 (×3): qty 10

## 2023-01-22 MED ORDER — CLONIDINE HCL 0.1 MG PO TABS
0.1000 mg | ORAL_TABLET | Freq: Three times a day (TID) | ORAL | Status: DC
  Administered 2023-01-22 – 2023-01-28 (×17): 0.1 mg via ORAL
  Filled 2023-01-22 (×17): qty 1

## 2023-01-22 MED ORDER — INSULIN GLARGINE-YFGN 100 UNIT/ML ~~LOC~~ SOLN
15.0000 [IU] | Freq: Two times a day (BID) | SUBCUTANEOUS | Status: DC
  Administered 2023-01-22 – 2023-01-28 (×13): 15 [IU] via SUBCUTANEOUS
  Filled 2023-01-22 (×14): qty 0.15

## 2023-01-22 MED ORDER — FUROSEMIDE 10 MG/ML IJ SOLN: 100.0000 mg | Freq: Three times a day (TID) | INTRAVENOUS | Status: DC

## 2023-01-22 NOTE — Plan of Care (Signed)
  Problem: Activity: Goal: Ability to tolerate increased activity will improve Outcome: Progressing   

## 2023-01-22 NOTE — Progress Notes (Addendum)
[facility] Progress Note  Subjective: UOP 1400 yes  Vitals:   01/22/23 0510 01/22/23 0910 01/22/23 0930 01/22/23 1635  BP: (!) 153/83  (!) 145/69 (!) 146/75  Pulse: 79 77 79 78  Resp: 18 16 18 18  $ Temp: 98.5 F (36.9 C)  97.9 F (36.6 C) 98.2 F (36.8 C)  TempSrc: Oral  Oral Oral  SpO2: 100% 99% 100% 100%  Weight:      Height:        Exam: Gen alert, no distress No rash, cyanosis or gangrene Sclera anicteric, throat clear  No jvd or bruits Chest clear bilat to bases, no rales/ wheezing RRR no MRG Abd soft ntnd no mass or ascites +bs GU defer MS no joint effusions or deformity Ext 1+ pretib pitting bilat LE edema Neuro is alert, Ox 3 , nf      Home meds include - norvasc 10, lipitor, coreg 25 bid, clonidine 0.3 tid, cymbalta, pepcid, hydralazine 100 tid, protonix, sod bicarb, trazodone, tresiba insulin, prns/ vits/ supps     UA - pending    UNa, UCr pending     B/l creatinine from oct 2023 - jan 2024 is 6.8 - 7.5, eGFR 6- 7 ml/min           Assessment/ Plan: AKI on CKD 5 - B/l creatinine from oct 2023 - jan 2024 is 6.8 - 7.5, eGFR 6- 7 ml/min. F/b Dr Hollie Salk at [facility]. Creat here was 8.4 on admission in setting of AHRF due to CAP and/or vol overload. CT chest showed diffuse GG changes. Pt was diuresed for 2 days w/ good response, still 11 lbs above usual wt. UA and renal US pending. Pt's renal function is failing and she will likely need HD sometime soon, but does not have uremic findings at this point and RRT not needed at this time. BP's good. Resumed IV lasix 80 bid yesterday - UOP good but could be better. Will ^ IV lasix to 177m tid, f/u labs / UOP in am. Will need to tolerate rise in creatinine potentially. Will follow.  A/C hypoxic resp failure - getting IV abx for CAP and IV lasix for vol overload Chron resp failure / chronic trach-dependent IDDM - per pmd Uncont HTN - better    RKelly SplinterMD [facility] 01/22/2023, 6:20 PM  Recent Labs  Lab  01/20/23 0529 01/21/23 0258 01/22/23 0846  HGB  --  7.2* 7.8*  ALBUMIN  --  2.1* 2.2*  CALCIUM  --  7.7* 8.0*  PHOS 6.9*  --   --   CREATININE  --  8.38* 8.10*  K  --  4.2 3.4*   Recent Labs  Lab 01/18/23 1522  IRON 26*  TIBC 225*  FERRITIN 284   Inpatient medications:  amLODipine  10 mg Oral QHS   atorvastatin  20 mg Oral Daily   azelastine  2 spray Each Nare BID   carvedilol  25 mg Oral BID WC   cetirizine  5 mg Oral QPM   cloNIDine  0.1 mg Oral TID   DULoxetine  30 mg Oral Daily   famotidine  20 mg Oral QHS   ferrous sulfate  325 mg Oral Q breakfast   fluticasone  2 spray Each Nare Daily   furosemide  80 mg Intravenous BID   gabapentin  100 mg Oral QHS   guaiFENesin  1,200 mg Oral BID   hydrALAZINE  100 mg Oral TID   insulin aspart  0-9 Units Subcutaneous TID  WC   insulin glargine-yfgn  15 Units Subcutaneous BID   isosorbide mononitrate  30 mg Oral Daily   pantoprazole  40 mg Oral Daily   sodium bicarbonate  650 mg Oral BID   sodium polystyrene  15 g Oral Daily   traZODone  50 mg Oral QHS    sodium chloride     azithromycin 500 mg (01/22/23 1759)   cefTRIAXone (ROCEPHIN)  IV 2 g (01/22/23 1736)   sodium chloride, ipratropium-albuterol, labetalol, lidocaine, promethazine

## 2023-01-22 NOTE — Progress Notes (Addendum)
  Progress Note   Patient: Maureen Jones CNO:[id number] DOB: [birth year] DOA: 01/18/2023     4 DOS: the patient was seen and examined on 01/22/2023   Brief hospital course: 56 y.o. 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   Pt presented with sob, streaky blood in sputum, reported fevers. Pt admitted for concerns of volume overload and CAP  Assessment and Plan: Acute on chronic hypoxic respiratory failure -With chronic tracheostomy -chest imaging reviewed. Patchy disease that can be both edema vs PNA -Suspect underlying CAP, given the worsening of productive cough and new fever -CAP coverage with ceftriaxone and azithromycin -Other supportive medication and measures including DuoNebs, guaifenesin, incentive spirometry and flutter valve -Wean o2 to pt's baseline requirements -Continued on IV lasix per Nephrology   Acute on chronic symptomatic anemia -Probably worsening of her baseline CKD related anemia -Pt s/p 1 unit PRBC -Hgb seems stable at this time, following CBC trends   Hemoptysis -Likely related to pneumonia -No further episodes   Acute on chronic HFpEF -Voided very well with IV lasix -Continue IV lasix per nephrology   AKI on CKD stage V -Significant fluid overload, management as above -Cr remains over baseline, currently 8.1 -Appreciate Nephrology assistance. Still vol overloaded and continued on lasix IV BID   Chronic proteinuria -Appears to be in the nephrotic range -ACEI was discontinued November 2023 due to hyperkalemia.   IDDM with insulin resistance -On semglee 15 units bid -glucose of 67 noted this AM. Will decrease insulin to 15u U AM and 15 U PM -Cont SSI   HTN uncontrolled -Continue amlodipine and Coreg -Continue hydralazine 100 mg 3 times  daily, add Imdur -Wean off clonidine in 2-3 days, given the history of CHF   Morbid obesity -BMI=38 -Cut down calorie intake  Abnormal sensorum -Recent events noted -CT head performed, neg -On further questioning, pt described feeling "foggy headed" -suspect related to renal disease  Diabetic neuropathy -Reports significant burning/tingling in B feet worse at night -giving trial of gabapentin 100mg  qhs -Have ordered trial of topical lidocaine as well  Hypomagnesemia -Replaced      Subjective: Without complaints. Reports voiding well  Physical Exam: Vitals:   01/22/23 0510 01/22/23 0910 01/22/23 0930 01/22/23 1635  BP: (!) 153/83  (!) 145/69 (!) 146/75  Pulse: 79 77 79 78  Resp: 18 16 18 18   Temp: 98.5 F (36.9 C)  97.9 F (36.6 C) 98.2 F (36.8 C)  TempSrc: Oral  Oral Oral  SpO2: 100% 99% 100% 100%  Weight:      Height:       General exam: Awake, laying in bed, in nad Respiratory system: Normal respiratory effort, no wheezing Cardiovascular system: regular rate, s1, s2 Gastrointestinal system: Soft, nondistended, positive BS Central nervous system: CN2-12 grossly intact, strength intact Extremities: Perfused, no clubbing Skin: Normal skin turgor, no notable skin lesions seen Psychiatry: Mood normal // no visual hallucinations   Data Reviewed:  Labs reviewed: Na 139, K 3.4, Cr 8.10, hgb 7.8  Family Communication: Pt in room, family not at bedside  Disposition: Status is: Inpatient Remains inpatient appropriate because: Severity of illness  Planned Discharge Destination: Home    Author: Marylu Lund, MD 01/22/2023 4:42 PM  For on call review [email or web address].

## 2023-01-22 NOTE — Plan of Care (Signed)
  Problem: Activity: Goal: Ability to tolerate increased activity will improve 01/22/2023 2324 by Karl Bales, RN Outcome: Progressing 01/22/2023 2322 by Karl Bales, RN Outcome: Progressing   Problem: Respiratory: Goal: Ability to maintain adequate ventilation will improve Outcome: Progressing Goal: Ability to maintain a clear airway will improve Outcome: Progressing   Problem: Clinical Measurements: Goal: Ability to maintain clinical measurements within normal limits will improve Outcome: Progressing Goal: Will remain free from infection Outcome: Progressing

## 2023-01-23 ENCOUNTER — Ambulatory Visit: Payer: Medicare Other | Admitting: Nurse Practitioner

## 2023-01-23 DIAGNOSIS — D649 Anemia, unspecified: Secondary | ICD-10-CM | POA: Diagnosis not present

## 2023-01-23 DIAGNOSIS — R042 Hemoptysis: Secondary | ICD-10-CM | POA: Diagnosis not present

## 2023-01-23 LAB — COMPREHENSIVE METABOLIC PANEL
ALT: 11 U/L (ref 0–44)
AST: 14 U/L — ABNORMAL LOW (ref 15–41)
Albumin: 2.3 g/dL — ABNORMAL LOW (ref 3.5–5.0)
Alkaline Phosphatase: 105 U/L (ref 38–126)
Anion gap: 16 — ABNORMAL HIGH (ref 5–15)
BUN: 82 mg/dL — ABNORMAL HIGH (ref 6–20)
CO2: 19 mmol/L — ABNORMAL LOW (ref 22–32)
Calcium: 7.7 mg/dL — ABNORMAL LOW (ref 8.9–10.3)
Chloride: 106 mmol/L (ref 98–111)
Creatinine, Ser: 8.36 mg/dL — ABNORMAL HIGH (ref 0.44–1.00)
GFR, Estimated: 5 mL/min — ABNORMAL LOW (ref >60)
Glucose, Bld: 140 mg/dL — ABNORMAL HIGH (ref 70–99)
Potassium: 3.8 mmol/L (ref 3.5–5.1)
Sodium: 141 mmol/L (ref 135–145)
Total Bilirubin: 0.6 mg/dL (ref 0.3–1.2)
Total Protein: 6 g/dL — ABNORMAL LOW (ref 6.5–8.1)

## 2023-01-23 LAB — CBC
HCT: 23.7 % — ABNORMAL LOW (ref 36.0–46.0)
Hemoglobin: 7.2 g/dL — ABNORMAL LOW (ref 12.0–15.0)
MCH: 28.6 pg (ref 26.0–34.0)
MCHC: 30.4 g/dL (ref 30.0–36.0)
MCV: 94 fL (ref 80.0–100.0)
Platelets: 290 10*3/uL (ref 150–400)
RBC: 2.52 MIL/uL — ABNORMAL LOW (ref 3.87–5.11)
RDW: 13.3 % (ref 11.5–15.5)
WBC: 12 10*3/uL — ABNORMAL HIGH (ref 4.0–10.5)
nRBC: 0 % (ref 0.0–0.2)

## 2023-01-23 LAB — GLUCOSE, CAPILLARY
Glucose-Capillary: 136 mg/dL — ABNORMAL HIGH (ref 70–99)
Glucose-Capillary: 130 mg/dL — ABNORMAL HIGH (ref 70–99)
Glucose-Capillary: 179 mg/dL — ABNORMAL HIGH (ref 70–99)
Glucose-Capillary: 223 mg/dL — ABNORMAL HIGH (ref 70–99)

## 2023-01-23 MED ORDER — METOLAZONE 5 MG PO TABS
5.0000 mg | ORAL_TABLET | Freq: Once | ORAL | Status: AC
  Administered 2023-01-23: 5 mg via ORAL
  Filled 2023-01-23: qty 1

## 2023-01-23 MED ORDER — DARBEPOETIN ALFA 100 MCG/0.5ML IJ SOSY
100.0000 ug | PREFILLED_SYRINGE | INTRAMUSCULAR | Status: DC
  Administered 2023-01-25: 100 ug via SUBCUTANEOUS
  Filled 2023-01-23: qty 0.5

## 2023-01-23 NOTE — Progress Notes (Signed)
Nephrology Follow-Up Consult note   Assessment/Recommendations: Maureen Jones is a/an 57 y.o. female with a past medical history significant for chronic hypoxic respiratory failure, CKD 5, DM2, morbid obesity, admitted for acute on chronic hypoxic respiratory failure.       Nonoliguric AKI on CKD 5: Baseline creatinine around 6.8-7.5 followed by Dr. Hollie Salk in the outpatient setting.  Creatinine slightly worse at this time with some volume overload.  Minimal response to diuretics at this time but hypoxia overall improved  There are a lot of barriers for this patient's care regarding nearing ESRD.  The patient cannot easily undergo hemodialysis in center because of her trach.  There has been tentative plan for home dialysis with Dr. Hollie Salk.  However the patient is planning to move to [place] in a couple weeks and so this plan would not be feasible.  Ultimately we will do our best to keep the patient off dialysis until she can moved to [place].  Once there she can find a way to get dialysis.  This is not an optimal plan but our options are limited -IV Lasix 120 3 times daily, add metolazone 5 mg today -Continue to monitor daily Cr, Dose meds for GFR -Monitor Daily I/Os, Daily weight  -Maintain MAP>65 for optimal renal perfusion.  -Avoid nephrotoxic medications including NSAIDs -Use synthetic opioids (Fentanyl/Dilaudid) if needed -Monitor closely for hemodialysis needs -Hold on access creation so as not to complicate potential discharge  Acute on chronic hypoxic respiratory failure: Likely multifactorial with some volume overload as well as CAP.  Antibiotics per primary.  Diuretics as above  Anemia of CKD: Likely multifactorial but hemoglobin significantly low.  Recently received transfusion.  Will add on ESA  Hypoalbuminemia: Consider IV albumin  Metabolic acidosis: Mild.  Continue to monitor  Uncontrolled Diabetes Mellitus Type 2 with Hyperglycemia: Management per primary  team   Recommendations conveyed to primary service.    [facility] 01/23/2023 10:16 AM  ___________________________________________________________  CC: Shortness of breath  Interval History/Subjective: Patient feels well today with no complaints.  Creatinine slightly increased to 8.4 from 8.1.   Medications:  Current Facility-Administered Medications  Medication Dose Route Frequency Provider Last Rate Last Admin   0.9 %  sodium chloride infusion   Intravenous PRN Wynetta Fines T, MD       amLODipine (NORVASC) tablet 10 mg  10 mg Oral QHS Wynetta Fines T, MD   10 mg at 01/22/23 2107   atorvastatin (LIPITOR) tablet 20 mg  20 mg Oral Daily Wynetta Fines T, MD   20 mg at 01/23/23 0858   azelastine (ASTELIN) 0.1 % nasal spray 2 spray  2 spray Each Nare BID Wynetta Fines T, MD   2 spray at 01/23/23 0902   azithromycin (ZITHROMAX) 500 mg in sodium chloride 0.9 % 250 mL IVPB  500 mg Intravenous Q24H Wynetta Fines T, MD 250 mL/hr at 01/22/23 1759 500 mg at 01/22/23 1759   carvedilol (COREG) tablet 25 mg  25 mg Oral BID WC Wynetta Fines T, MD   25 mg at 01/23/23 0858   cefTRIAXone (ROCEPHIN) 2 g in sodium chloride 0.9 % 100 mL IVPB  2 g Intravenous Q24H Wynetta Fines T, MD 200 mL/hr at 01/22/23 1736 2 g at 01/22/23 1736   cetirizine (ZYRTEC) tablet 5 mg  5 mg Oral QPM Wynetta Fines T, MD   5 mg at 01/22/23 1738   cloNIDine (CATAPRES) tablet 0.1 mg  0.1 mg Oral TID Donne Hazel, MD  0.1 mg at 01/23/23 0858   DULoxetine (CYMBALTA) DR capsule 30 mg  30 mg Oral Daily Wynetta Fines T, MD   30 mg at 01/23/23 0857   famotidine (PEPCID) tablet 20 mg  20 mg Oral QHS Wynetta Fines T, MD   20 mg at 01/22/23 2108   ferrous sulfate tablet 325 mg  325 mg Oral Q breakfast Wynetta Fines T, MD   325 mg at 01/23/23 0858   fluticasone (FLONASE) 50 MCG/ACT nasal spray 2 spray  2 spray Each Nare Daily Wynetta Fines T, MD   2 spray at 01/23/23 0902   furosemide (LASIX) 120 mg in dextrose 5 % 50 mL IVPB   120 mg Intravenous Q8H Roney Jaffe, MD 62 mL/hr at 01/23/23 0552 120 mg at 01/23/23 0552   gabapentin (NEURONTIN) capsule 100 mg  100 mg Oral QHS Donne Hazel, MD   100 mg at 01/22/23 2108   guaiFENesin (MUCINEX) 12 hr tablet 1,200 mg  1,200 mg Oral BID Wynetta Fines T, MD   1,200 mg at 01/23/23 0859   hydrALAZINE (APRESOLINE) tablet 100 mg  100 mg Oral TID Wynetta Fines T, MD   100 mg at 01/23/23 0859   insulin aspart (novoLOG) injection 0-9 Units  0-9 Units Subcutaneous TID WC Wynetta Fines T, MD   1 Units at 01/23/23 0900   insulin glargine-yfgn (SEMGLEE) injection 15 Units  15 Units Subcutaneous BID Donne Hazel, MD   15 Units at 01/23/23 0859   ipratropium-albuterol (DUONEB) 0.5-2.5 (3) MG/3ML nebulizer solution 3 mL  3 mL Nebulization Q6H PRN Donne Hazel, MD       isosorbide mononitrate (IMDUR) 24 hr tablet 30 mg  30 mg Oral Daily Wynetta Fines T, MD   30 mg at 01/23/23 0859   labetalol (NORMODYNE) injection 10 mg  10 mg Intravenous Q4H PRN Wynetta Fines T, MD       lidocaine (LMX) 4 % cream   Topical Daily PRN Donne Hazel, MD       pantoprazole (PROTONIX) EC tablet 40 mg  40 mg Oral Daily Wynetta Fines T, MD   40 mg at 01/23/23 0859   promethazine (PHENERGAN) tablet 25 mg  25 mg Oral Q8H PRN Wynetta Fines T, MD   25 mg at 01/22/23 [id number]   sodium bicarbonate tablet 650 mg  650 mg Oral BID Wynetta Fines T, MD   650 mg at 01/23/23 0859   traZODone (DESYREL) tablet 50 mg  50 mg Oral QHS Wynetta Fines T, MD   50 mg at 01/22/23 2107      Review of Systems: 10 systems reviewed and negative except per interval history/subjective  Physical Exam: Vitals:   01/23/23 0759 01/23/23 0843  BP:  (!) 162/80  Pulse: 82 86  Resp: 18 19  Temp:  97.6 F (36.4 C)  SpO2: 98% 93%   No intake/output data recorded.  Intake/Output Summary (Last 24 hours) at 01/23/2023 1016 Last data filed at 01/23/2023 0600 Gross per 24 hour  Intake 972.91 ml  Output 1000 ml  Net -27.09 ml   Constitutional:  well-appearing, no acute distress ENMT: ears and nose without scars or lesions, MMM CV: normal rate, 1+ lower extremity edema Respiratory: clear to auscultation, normal work of breathing, trach present Gastrointestinal: soft, non-tender, no palpable masses or hernias Skin: no visible lesions or rashes Psych: alert, judgement/insight appropriate, appropriate mood and affect   Test Results I personally reviewed new and old clinical labs and radiology tests  Lab Results  Component Value Date   NA 141 01/23/2023   K 3.8 01/23/2023   CL 106 01/23/2023   CO2 19 (L) 01/23/2023   BUN 82 (H) 01/23/2023   CREATININE 8.36 (H) 01/23/2023   GFR 6.98 (LL) 10/27/2022   CALCIUM 7.7 (L) 01/23/2023   ALBUMIN 2.3 (L) 01/23/2023   PHOS 6.9 (H) 01/20/2023    CBC Recent Labs  Lab 01/18/23 1246 01/18/23 2205 01/21/23 0258 01/22/23 0846 01/23/23 0439  WBC 15.5*   < > 12.4* 15.2* 12.0*  NEUTROABS 11.4*  --   --   --   --   HGB 6.8*   < > 7.2* 7.8* 7.2*  HCT 21.2*   < > 22.2* 24.7* 23.7*  MCV 91.4   < > 91.0 90.1 94.0  PLT 298   < > 268 300 290   < > = values in this interval not displayed.

## 2023-01-23 NOTE — Progress Notes (Signed)
  Progress Note   Patient: Maureen Jones DXA:[id number] DOB: Jan 17, 1966 DOA: 01/18/2023     5 DOS: the patient was seen and examined on 01/23/2023   Brief hospital course: 57 y.o. 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   Pt presented with sob, streaky blood in sputum, reported fevers. Pt admitted for concerns of volume overload and CAP  Assessment and Plan: Acute on chronic hypoxic respiratory failure -With chronic tracheostomy -chest imaging reviewed. Patchy disease that can be both edema vs PNA -Suspect underlying CAP, given the worsening of productive cough and new fever -CAP coverage with ceftriaxone and azithromycin -Other supportive medication and measures including DuoNebs, guaifenesin, incentive spirometry and flutter valve -Now weaned to room air -Continued diuresis per Nephrology   Acute on chronic symptomatic anemia -Probably worsening of her baseline CKD related anemia -Pt s/p 1 unit PRBC this visit -Hgb seems stable at this time, following CBC trends   Hemoptysis -Likely related to pneumonia -No further episodes   Acute on chronic HFpEF -Voided very well with IV lasix -Continue diuresis per Nephrology   AKI on CKD stage V -Significant fluid overload, management as above -Cr remains over baseline, currently 8.36 -Appreciate Nephrology assistance. Trying to hold off dialysis as possible until pt can move to [place] and once there, pt to find a way to get HD there - Current barrier to HD is pt's trach, which she intends to f/u at [facility] after her move   Chronic proteinuria -Appears to be in the nephrotic range -ACEI was discontinued November 2023 due to hyperkalemia.   IDDM with insulin resistance -On semglee 15 units bid -Glycemic trends  stable -Cont SSI   HTN uncontrolled -Continue amlodipine and Coreg -Continue hydralazine 100 mg 3 times daily, add Imdur -on clonidine 0.1mg  tid   Morbid obesity -BMI=38 -Cut down calorie intake  Abnormal sensorum -Recent events noted -CT head performed, neg -On further questioning, pt described feeling "foggy headed" -suspect related to renal disease  Diabetic neuropathy -Reports significant burning/tingling in B feet worse at night -giving trial of gabapentin 100mg  qhs -Have ordered trial of topical lidocaine as well  Hypomagnesemia -Replaced      Subjective: Reports breathing better now. Without complaints  Physical Exam: Vitals:   01/23/23 0320 01/23/23 0431 01/23/23 0759 01/23/23 0843  BP:  (!) 143/69  (!) 162/80  Pulse:  84 82 86  Resp:  18 18 19   Temp:  97.9 F (36.6 C)  97.6 F (36.4 C)  TempSrc:  Oral  Oral  SpO2: 100% 99% 98% 93%  Weight:      Height:       General exam: Conversant, in no acute distress Respiratory system: normal chest rise, clear, no audible wheezing, on room air Cardiovascular system: regular rhythm, s1-s2 Gastrointestinal system: Nondistended, nontender, pos BS Central nervous system: No seizures, no tremors Extremities: No cyanosis, no joint deformities Skin: No rashes, no pallor Psychiatry: Affect normal // no auditory hallucinations   Data Reviewed:  Labs reviewed: Na 141, K 3.8, Cr 8.36, WBC 12.0, Hgb 7.2  Family Communication: Pt in room, family not at bedside  Disposition: Status is: Inpatient Remains inpatient appropriate because: Severity of illness  Planned Discharge Destination: Home    Author: Marylu Lund, MD 01/23/2023 2:08 PM  For on call review [email or web address].

## 2023-01-23 NOTE — Progress Notes (Signed)
Physical Therapy Treatment Patient Details Name: Maureen Jones MRN: [id number] DOB: 03/28/1966 Today's Date: 01/23/2023   History of Present Illness 57 y.o. female admitted 2/7 with malaise, shortness of breath, and coughing up blood. Acute on chronic hypoxic respiratory failure, Acute on chronic symptomatic anemia, AKI on CKD stage V,  and Acute on chronic HFpEF.  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    PT Comments    Progressing well towards functional goals. Safely completed stair training today, navigating without physical assistance, using single rail similar to home set-up. Tolerated dynamic balance exercises with min guard for safety. Ambulating at a supervision level, no AD but will benefit from [facility] for mild instability present (will attempt to bring [facility] next session.) SpO2 92-93% on RA throughout session. HR 105 on stairs. Patient will continue to benefit from skilled physical therapy services to further improve independence with functional mobility.    Recommendations for follow up therapy are one component of a multi-disciplinary discharge planning process, led by the attending physician.  Recommendations may be updated based on patient status, additional functional criteria and insurance authorization.  Follow Up Recommendations  Home health PT Can patient physically be transported by private vehicle: Yes   Assistance Recommended at Discharge Set up Supervision/Assistance  Patient can return home with the following A little help with walking and/or transfers;A little help with bathing/dressing/bathroom;Assistance with cooking/housework;Assist for transportation;Help with stairs or ramp for entrance   Equipment Recommendations  Cane    Recommendations for Other Services       Precautions / Restrictions Precautions Precautions: Fall Precaution Comments:  Monitor O2, trach Restrictions Weight Bearing Restrictions: No     Mobility  Bed Mobility               General bed mobility comments: in recliner    Transfers Overall transfer level: Needs assistance Equipment used: None Transfers: Sit to/from Stand Sit to Stand: Supervision           General transfer comment: Supervision for safety, minimal instability. Cues to be aware of COB prior to ambulating.    Ambulation/Gait Ambulation/Gait assistance: Supervision Gait Distance (Feet): 215 Feet Assistive device: None Gait Pattern/deviations: Step-through pattern, Drifts right/left, Wide base of support Gait velocity: decr Gait velocity interpretation: <1.8 ft/sec, indicate of risk for recurrent falls   General Gait Details: No overt LOB today, still showing some drift but sway is improving. Supervision for safety throughout. Increasing pace gradually.   Stairs Stairs: Yes Stairs assistance: Supervision Stair Management: One rail Left, Step to pattern, Forwards, Sideways Number of Stairs: 7 General stair comments: Practiced forward and lateral steps with single rail on Lt. Pt more confident and stable with lateral approach bil hands for support. Slow to step up, good control with step down. Noticeable weakness pressing up but without buckle or need for physical assist. Cues for sequencing. Feels confident with task. Set-up similar to home environment.   Wheelchair Mobility    Modified Rankin (Stroke Patients Only)       Balance Overall balance assessment: Needs assistance Sitting-balance support: No upper extremity supported, Feet supported Sitting balance-[name]: Good     Standing balance support: No upper extremity supported, During functional activity Standing balance-[name]: Good Standing balance comment: able to stand without support. Mild sway  Cognition Arousal/Alertness: Awake/alert Behavior During  Therapy: WFL for tasks assessed/performed Overall Cognitive Status: Within Functional Limits for tasks assessed                                          Exercises Other Exercises Other Exercises: Dynamic balance activities performed in hallway: using rail for light support as needed. Lateral steps 10' x 4, forward march 10' x2, backwards steps 10' x2. Min guard for safety, some intermittent instability with lateral steps but able to self correct.    General Comments        Pertinent Vitals/Pain Pain Assessment Pain Assessment: No/denies pain    Home Living                          Prior Function            PT Goals (current goals can now be found in the care plan section) Acute Rehab PT Goals Patient Stated Goal: Get well, move to [place] at the end of the month PT Goal Formulation: With patient Time For Goal Achievement: 02/03/23 Potential to Achieve Goals: Good Progress towards PT goals: Progressing toward goals    Frequency    Min 3X/week      PT Plan Current plan remains appropriate    Co-evaluation              AM-PAC PT "6 Clicks" Mobility   Outcome Measure  Help needed turning from your back to your side while in a flat bed without using bedrails?: None Help needed moving from lying on your back to sitting on the side of a flat bed without using bedrails?: None Help needed moving to and from a bed to a chair (including a wheelchair)?: A Little Help needed standing up from a chair using your arms (e.g., wheelchair or bedside chair)?: A Little Help needed to walk in hospital room?: A Little Help needed climbing 3-5 steps with a railing? : A Lot 6 Click Score: 19    End of Session Equipment Utilized During Treatment: Gait belt Activity Tolerance: Patient tolerated treatment well Patient left: in chair;with call bell/phone within reach;with chair alarm set;with SCD's reapplied Nurse Communication: Mobility status PT Visit  Diagnosis: Unsteadiness on feet (R26.81);Other abnormalities of gait and mobility (R26.89);Muscle weakness (generalized) (M62.81);Difficulty in walking, not elsewhere classified (R26.2)     Time: [id number] PT Time Calculation (min) (ACUTE ONLY): 19 min  Charges:  $Gait Training: 8-22 mins                     Candie Mile, PT, DPT Physical Therapist Acute Rehabilitation Services [place]    [name] 01/23/2023, 11:57 AM

## 2023-01-23 NOTE — Progress Notes (Signed)
Mobility Specialist Progress Note   01/23/23 1640  Mobility  Activity Ambulated with assistance in hallway  Level of Assistance Contact guard assist, steadying assist  Assistive Device Cane  Distance Ambulated (ft) 440 ft  Activity Response Tolerated well  Mobility Referral Yes  $Mobility charge 1 Mobility   During Mobility: 95 HR, 93% SpO2 on RA  Post Mobility: 95 HR, 94% SpO2 on RA  Received pt in bed having no complaints and agreeable. Suggested use of SPC per PT note and pt requiring time to get adjusted, x1 slight LOB but recovered by self. Pt requiring x2 standing rest breaks d/t SOB but remaining > 92% SpO2. Returned back to room w/o incident, call bell in reach and all needs met. Pt onboard w/ using AD again.     Holland Falling Mobility Specialist Please contact via SecureChat or  Rehab office at [phone]

## 2023-01-24 DIAGNOSIS — D649 Anemia, unspecified: Secondary | ICD-10-CM | POA: Diagnosis not present

## 2023-01-24 DIAGNOSIS — R042 Hemoptysis: Secondary | ICD-10-CM | POA: Diagnosis not present

## 2023-01-24 LAB — GLUCOSE, CAPILLARY
Glucose-Capillary: 96 mg/dL (ref 70–99)
Glucose-Capillary: 155 mg/dL — ABNORMAL HIGH (ref 70–99)
Glucose-Capillary: 181 mg/dL — ABNORMAL HIGH (ref 70–99)
Glucose-Capillary: 196 mg/dL — ABNORMAL HIGH (ref 70–99)

## 2023-01-24 LAB — COMPREHENSIVE METABOLIC PANEL
ALT: 12 U/L (ref 0–44)
AST: 16 U/L (ref 15–41)
Albumin: 2.3 g/dL — ABNORMAL LOW (ref 3.5–5.0)
Alkaline Phosphatase: 109 U/L (ref 38–126)
Anion gap: 15 (ref 5–15)
BUN: 86 mg/dL — ABNORMAL HIGH (ref 6–20)
CO2: 19 mmol/L — ABNORMAL LOW (ref 22–32)
Calcium: 7.7 mg/dL — ABNORMAL LOW (ref 8.9–10.3)
Chloride: 104 mmol/L (ref 98–111)
Creatinine, Ser: 8.65 mg/dL — ABNORMAL HIGH (ref 0.44–1.00)
GFR, Estimated: 5 mL/min — ABNORMAL LOW (ref >60)
Glucose, Bld: 140 mg/dL — ABNORMAL HIGH (ref 70–99)
Potassium: 3.6 mmol/L (ref 3.5–5.1)
Sodium: 138 mmol/L (ref 135–145)
Total Bilirubin: 0.5 mg/dL (ref 0.3–1.2)
Total Protein: 6 g/dL — ABNORMAL LOW (ref 6.5–8.1)

## 2023-01-24 LAB — CBC
HCT: 22.1 % — ABNORMAL LOW (ref 36.0–46.0)
Hemoglobin: 7.2 g/dL — ABNORMAL LOW (ref 12.0–15.0)
MCH: 29.6 pg (ref 26.0–34.0)
MCHC: 32.6 g/dL (ref 30.0–36.0)
MCV: 90.9 fL (ref 80.0–100.0)
Platelets: 284 10*3/uL (ref 150–400)
RBC: 2.43 MIL/uL — ABNORMAL LOW (ref 3.87–5.11)
RDW: 13.1 % (ref 11.5–15.5)
WBC: 12.8 10*3/uL — ABNORMAL HIGH (ref 4.0–10.5)
nRBC: 0 % (ref 0.0–0.2)

## 2023-01-24 MED ORDER — METOLAZONE 5 MG PO TABS
5.0000 mg | ORAL_TABLET | Freq: Once | ORAL | Status: AC
  Administered 2023-01-24: 5 mg via ORAL
  Filled 2023-01-24: qty 1

## 2023-01-24 NOTE — TOC Progression Note (Addendum)
Transition of Care [facility]) - Progression Note    Patient Details  Name: Maureen Jones MRN: [id number] Date of Birth: September 15, 1966  Transition of Care [facility]) CM/SW Contact  [name], [name], RN Phone Number: 01/24/2023, 10:20 AM  Clinical Narrative:     Patient continues diuresis per Nephrology, on IV Lasix. Trach collar on room air. Completed IV abx yesterday 01/23/23.  Home Health referral called in to [facility] and Claiborne Billings voiced acceptance with a delay in start of care till 01/30/23. Info on AVS. CM will continue to follow as patient progresses with care towards discharge.        Expected Discharge Plan: [place] Barriers to Discharge: Continued Medical Work up  Expected Discharge Plan and Services In-house Referral: Clinical Social Work Discharge Planning Services: CM Consult   Living arrangements for the past 2 months: Single Family Home                 DME Arranged: Kasandra Knudsen DME Agency: [employer] Date DME Agency Contacted: 01/20/23 Time DME Agency Contacted: 1620 Representative spoke with at DME Agency: Erasmo Downer HH Arranged: PT [place] Agency: [facility] Date [place]: 01/20/23 Time [place]: 1622 Representative spoke with at [place]: Corinne ([place]) Interventions SDOH Screenings   Transportation Needs: No Transportation Needs (12/21/2022)  Depression (PHQ2-9): Medium Risk (01/02/2023)  Physical Activity: Inactive (12/20/2022)  Stress: Stress Concern Present (01/02/2023)  Tobacco Use: Medium Risk (11/23/2022)    Readmission Risk Interventions    12/13/2021   12:35 PM 11/15/2021   10:13 AM  Readmission Risk Prevention Plan  Transportation Screening Complete Complete  PCP or Specialist Appt within 3-5 Days Complete Complete  HRI or [place] Complete Complete  Social Work Consult for [place] Planning/Counseling Complete Patient refused  Palliative Care Screening Not  Applicable Not Applicable  Medication Review Press photographer) Complete Complete

## 2023-01-24 NOTE — Progress Notes (Signed)
Physical Therapy Treatment Patient Details Name: Maureen Jones MRN: [id number] DOB: August 09, 1966 Today's Date: 01/24/2023   History of Present Illness 57 y.o. female admitted 2/7 with malaise, shortness of breath, and coughing up blood. Acute on chronic hypoxic respiratory failure, Acute on chronic symptomatic anemia, AKI on CKD stage V,  and Acute on chronic HFpEF.  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    PT Comments    Pt notably more fatigued today. Regardless eager to work with PT. Educated on gait with SPC use. Some instability, corrected by placing in Rt hand which improved symmetry. LE fatigued quickly with short bout of 150 compared to baseline bouts. SpO2 was noted to be 88% upon returning to room, no overt DOE. Quickly rose to upper 90s again upon sitting with reapplication of trach collar O2. Performed LE exercises in sitting. Instructed to only walk with staff due to being a bit off balance today. Patient will continue to benefit from skilled physical therapy services to further improve independence with functional mobility.    Recommendations for follow up therapy are one component of a multi-disciplinary discharge planning process, led by the attending physician.  Recommendations may be updated based on patient status, additional functional criteria and insurance authorization.  Follow Up Recommendations  Home health PT Can patient physically be transported by private vehicle: Yes   Assistance Recommended at Discharge Set up Supervision/Assistance  Patient can return home with the following A little help with walking and/or transfers;A little help with bathing/dressing/bathroom;Assistance with cooking/housework;Assist for transportation;Help with stairs or ramp for entrance   Equipment Recommendations  Cane    Recommendations for Other Services        Precautions / Restrictions Precautions Precautions: Fall Precaution Comments: Monitor O2, trach Restrictions Weight Bearing Restrictions: No     Mobility  Bed Mobility Overal bed mobility: Modified Independent Bed Mobility: Supine to Sit           General bed mobility comments: Mod I, extra time. Managed lines/leads    Transfers Overall transfer level: Needs assistance Equipment used: Straight cane Transfers: Sit to/from Stand Sit to Stand: Supervision           General transfer comment: Educated on transition to rise and sit using SPC. Supervision for safety. Stable once upright with suppor.t    Ambulation/Gait Ambulation/Gait assistance: Min guard Gait Distance (Feet): 150 Feet Assistive device: Straight cane Gait Pattern/deviations: Step-through pattern, Drifts right/left, Wide base of support, Staggering left Gait velocity: decr Gait velocity interpretation: <1.31 ft/sec, indicative of household ambulator   General Gait Details: Increased sway today. Educated on [facility] use for ease at home including stairs. Pt demonstrated a couple episodes of instability when walking with SPC in left hand. Corrected by placing in Rt hand to counter leftward stagger, assuming it facilitates rightward comensation. Much improved gait symmetry with cane in Rt hand. Still having some instability with turns though. SpO2 came down to 88% until sitting back down with supplemental O2 applied.   Stairs         General stair comments: declines today   Wheelchair Mobility    Modified Rankin (Stroke Patients Only)       Balance Overall balance assessment: Needs assistance Sitting-balance support: No upper extremity supported, Feet supported Sitting balance-[name]: Good     Standing balance support: No upper extremity supported, During functional activity Standing balance-[name]: Fair Standing balance comment: Some  increased sway today                             Cognition Arousal/Alertness: Awake/alert Behavior During Therapy: WFL for tasks assessed/performed Overall Cognitive Status: Within Functional Limits for tasks assessed                                          Exercises General Exercises - Lower Extremity Ankle Circles/Pumps: AROM, Both, 20 reps, Seated Gluteal Sets: Strengthening, Both, 10 reps, Seated Long Arc Quad: Strengthening, Both, 10 reps, Seated Hip ABduction/ADduction: Strengthening, Both, 10 reps, Seated Hip Flexion/Marching: Strengthening, Both, 10 reps, Seated    General Comments General comments (skin integrity, edema, etc.): SpO2 99% on trach collar. down to 88% on RA while ambulating.      Pertinent Vitals/Pain Pain Assessment Pain Assessment: No/denies pain    Home Living                          Prior Function            PT Goals (current goals can now be found in the care plan section) Acute Rehab PT Goals Patient Stated Goal: Get well, move to DC at the end of the month PT Goal Formulation: With patient Time For Goal Achievement: 02/03/23 Potential to Achieve Goals: Good Progress towards PT goals: Progressing toward goals    Frequency    Min 3X/week      PT Plan Current plan remains appropriate    Co-evaluation              AM-PAC PT "6 Clicks" Mobility   Outcome Measure  Help needed turning from your back to your side while in a flat bed without using bedrails?: None Help needed moving from lying on your back to sitting on the side of a flat bed without using bedrails?: None Help needed moving to and from a bed to a chair (including a wheelchair)?: A Little Help needed standing up from a chair using your arms (e.g., wheelchair or bedside chair)?: A Little Help needed to walk in hospital room?: A Little Help needed climbing 3-5 steps with a railing? : A Lot 6 Click Score: 19    End of Session Equipment Utilized During Treatment: Gait  belt Activity Tolerance: Patient tolerated treatment well Patient left: in chair;with call bell/phone within reach;with chair alarm set;Other (comment) (OT to room) Nurse Communication: Mobility status PT Visit Diagnosis: Unsteadiness on feet (R26.81);Other abnormalities of gait and mobility (R26.89);Muscle weakness (generalized) (M62.81);Difficulty in walking, not elsewhere classified (R26.2)     Time: [id number] PT Time Calculation (min) (ACUTE ONLY): 21 min  Charges:  $Gait Training: 8-22 mins                     Candie Mile, PT, DPT Physical Therapist Acute Rehabilitation Services [place]    Maureen Jones 01/24/2023, 2:18 PM

## 2023-01-24 NOTE — Progress Notes (Signed)
OT Cancellation Note  Patient Details Name: Maureen Jones MRN: [id number] DOB: 1966-08-04   Cancelled Treatment:    Reason Eval/Treat Not Completed: Other (comment) Pt resting on OT attempts. Will follow up as schedule permits.   Layla Maw 01/24/2023, 12:44 PM

## 2023-01-24 NOTE — Progress Notes (Signed)
  Progress Note   Patient: Maureen Jones [id number] DOB: October 20, 1966 DOA: 01/18/2023     6 DOS: the patient was seen and examined on 01/24/2023   Brief hospital course: 57 y.o. 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   Pt presented with sob, streaky blood in sputum, reported fevers. Pt admitted for concerns of volume overload and CAP  Assessment and Plan: Acute on chronic hypoxic respiratory failure -With chronic tracheostomy -chest imaging reviewed. Patchy disease that can be both edema vs PNA -Suspect underlying CAP, given the worsening of productive cough and new fever -completed course of azithromycin and rocephin -Now weaned to baseline O2 requirements -Continued diuresis per Nephrology   Acute on chronic symptomatic anemia -Probably worsening of her baseline CKD related anemia -Pt s/p 1 unit PRBC this visit -Hgb seems stable at this time -cont to follow cbc trends   Hemoptysis -Likely related to pneumonia -No further episodes   Acute on chronic HFpEF -Continue diuresis per Nephrology   AKI on CKD stage V -Significant fluid overload, management as above -Cr remains over baseline, currently 8.65 -Appreciate Nephrology assistance. Trying to hold off dialysis as possible until pt can move to [place] and once there, pt to find a way to get HD there - Continued on 120mg  Q8h IV lasix   Chronic proteinuria -Appears to be in the nephrotic range -ACEI was discontinued November 2023 due to hyperkalemia.   IDDM with insulin resistance -On semglee 15 units bid -Glycemic trends stable -Cont SSI   HTN uncontrolled -Continue amlodipine and Coreg -Continue hydralazine 100 mg 3 times daily, add Imdur -on clonidine 0.1mg  tid   Morbid obesity -BMI=38 -Cut down  calorie intake  Abnormal sensorum -Recent events noted -CT head performed, neg -suspect related to renal disease  Diabetic neuropathy -Reported significant burning/tingling in B feet worse at night -giving trial of gabapentin 100mg  qhs -Have ordered trial of topical lidocaine as well  Hypomagnesemia -Replaced      Subjective: Reports feeling better today  Physical Exam: Vitals:   01/24/23 0605 01/24/23 0841 01/24/23 0939 01/24/23 1214  BP: (!) 162/76  (!) 145/70   Pulse: 85 77 81 84  Resp: 18 18 18 18   Temp: 98 F (36.7 C)  98.2 F (36.8 C)   TempSrc: Oral  Oral   SpO2: 98% 99% (!) 88% 97%  Weight:      Height:       General exam: Awake, laying in bed, in nad Respiratory system: Normal respiratory effort, no wheezing, trach in place Cardiovascular system: regular rate, s1, s2 Gastrointestinal system: Soft, nondistended, positive BS Central nervous system: CN2-12 grossly intact, strength intact Extremities: Perfused, no clubbing Skin: Normal skin turgor, no notable skin lesions seen Psychiatry: Mood normal // no visual hallucinations   Data Reviewed:  Labs reviewed: Na 138, K 3.6, Cr 8.65, WBC 12.8, Hgb 7.2   Family Communication: Pt in room, family not at bedside  Disposition: Status is: Inpatient Remains inpatient appropriate because: Severity of illness  Planned Discharge Destination: Home    Author: Marylu Lund, MD 01/24/2023 12:51 PM  For on call review [email or web address].

## 2023-01-24 NOTE — Progress Notes (Signed)
Occupational Therapy Treatment Patient Details Name: Maureen Jones MRN: [id number] DOB: June 24, 1966 Today's Date: 01/24/2023   History of present illness 57 y.o. female admitted 2/7 with malaise, shortness of breath, and coughing up blood. Acute on chronic hypoxic respiratory failure, Acute on chronic symptomatic anemia, AKI on CKD stage V,  and Acute on chronic HFpEF.  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   OT comments  Focus on AE practice for LB ADLs with pt able to return demo well. Active education regarding energy conservation during session as well with handouts provided to maximize carryover. Pt able to demo bathroom mobility with Modified Independence including toileting hygiene. Will follow for likely one additional OT session prior to DC. Pt appreciative of all education.    Recommendations for follow up therapy are one component of a multi-disciplinary discharge planning process, led by the attending physician.  Recommendations may be updated based on patient status, additional functional criteria and insurance authorization.    Follow Up Recommendations  No OT follow up     Assistance Recommended at Discharge PRN  Patient can return home with the following      Equipment Recommendations  None recommended by OT;Other (comment) (AE for LB ADL, shower chair with pt planning to search online or communicate needs with insurance company)    Recommendations for Other Services      Precautions / Restrictions Precautions Precautions: Fall Precaution Comments: Monitor O2, trach Restrictions Weight Bearing Restrictions: No       Mobility Bed Mobility               General bed mobility comments: received after ambulation with PT    Transfers Overall transfer level: Modified independent Equipment used: None Transfers: Sit to/from Stand Sit to Stand:  Modified independent (Device/Increase time)           General transfer comment: no AD from recliner, grab bars at toilet     Balance Overall balance assessment: Needs assistance Sitting-balance support: No upper extremity supported, Feet supported Sitting balance-[name]: Good     Standing balance support: No upper extremity supported, During functional activity Standing balance-[name]: Fair                             ADL either performed or assessed with clinical judgement   ADL Overall ADL's : Modified independent                                       General ADL Comments: Emphasis on AE use for LB ADLs w/ pt practicing during session and good carryover. Use of reacher, shoehorn, dressing stick or positional strategies to remove socks. Use of sock aid to don socks, educated on use of cloth sock aide d/t edema and use of smaller socks. Educated on use of reacher and straight cane to don underwear. Pt MOD I for mobility to/from bathroom and toileting tasks. Emphasis on energy conservation strategies with handout provided and active discussion regarding ADLs (showering, looking online for shower chair) and IADLs (gardening)    Extremity/Trunk Assessment Upper Extremity Assessment Upper Extremity Assessment: Overall WFL for tasks assessed   Lower Extremity Assessment Lower Extremity Assessment: Defer to PT evaluation        Vision   Vision Assessment?:  No apparent visual deficits   Perception     Praxis      Cognition Arousal/Alertness: Awake/alert Behavior During Therapy: WFL for tasks assessed/performed Overall Cognitive Status: Within Functional Limits for tasks assessed                                          Exercises      Shoulder Instructions       General Comments SpO2 99% on trach collar. down to 88% on RA while ambulating.    Pertinent Vitals/ Pain       Pain Assessment Pain Assessment:  No/denies pain  Home Living                                          Prior Functioning/Environment              Frequency  Min 2X/week        Progress Toward Goals  OT Goals(current goals can now be found in the care plan section)  Progress towards OT goals: Progressing toward goals  Acute Rehab OT Goals OT Goal Formulation: With patient Time For Goal Achievement: 02/03/23 Potential to Achieve Goals: Good ADL Goals Additional ADL Goal #1: Pt will be knowledgeable in use of AE for LB ADLs.  Plan Discharge plan remains appropriate    Co-evaluation                 AM-PAC OT "6 Clicks" Daily Activity     Outcome Measure   Help from another person eating meals?: None Help from another person taking care of personal grooming?: None Help from another person toileting, which includes using toliet, bedpan, or urinal?: None Help from another person bathing (including washing, rinsing, drying)?: None Help from another person to put on and taking off regular upper body clothing?: None Help from another person to put on and taking off regular lower body clothing?: None 6 Click Score: 24    End of Session Equipment Utilized During Treatment: Oxygen  OT Visit Diagnosis: Other (comment)   Activity Tolerance Patient tolerated treatment well   Patient Left in bed;with call bell/phone within reach   Nurse Communication Mobility status        Time: [id number] OT Time Calculation (min): 42 min  Charges: OT General Charges $OT Visit: 1 Visit OT Treatments $Self Care/Home Management : 38-52 mins  Malachy Chamber, OTR/L Acute Rehab Services Office: [phone]   Layla Maw 01/24/2023, 2:48 PM

## 2023-01-24 NOTE — Progress Notes (Signed)
Nephrology Follow-Up Consult note   Assessment/Recommendations: Maureen Jones is a/an 57 y.o. female with a past medical history significant for chronic hypoxic respiratory failure, CKD 5, DM2, morbid obesity, admitted for acute on chronic hypoxic respiratory failure.       Nonoliguric AKI on CKD 5: Baseline creatinine around 6.8-7.5 followed by Dr. Hollie Salk in the outpatient setting.  Creatinine slightly worse at this time with some volume overload.  Moderate response to diuretics so far.  There are a lot of barriers for this patient's care regarding nearing ESRD.  The patient cannot easily undergo hemodialysis in center because of her trach.  There has been tentative plan for home dialysis with Dr. Hollie Salk.  However the patient is planning to move to [place] in a couple weeks and so this plan would not be feasible.  Ultimately we will do our best to keep the patient off dialysis until she can moved to [place].  Once there she can find a way to get dialysis.  This is not an optimal plan but our options are limited.  She is starting to respond better to diuretics.  Continues to feel like she is volume overloaded -IV Lasix 120 3 times daily, again metolazone 5 mg today -Continue to monitor daily Cr, Dose meds for GFR -Monitor Daily I/Os, Daily weight  -Maintain MAP>65 for optimal renal perfusion.  -Avoid nephrotoxic medications including NSAIDs -Use synthetic opioids (Fentanyl/Dilaudid) if needed -Monitor closely for hemodialysis needs -Hold on access creation so as not to complicate potential discharge  Acute on chronic hypoxic respiratory failure: Likely multifactorial with some volume overload as well as CAP.  Antibiotics per primary.  Diuretics as above.  Try and wean oxygen as tolerated  Anemia of CKD: Likely multifactorial but hemoglobin significantly low.  Recently received transfusion.  ESA also added on  Metabolic acidosis: Mild.  Continue to monitor  Uncontrolled Diabetes  Mellitus Type 2 with Hyperglycemia: Management per primary team   Recommendations conveyed to primary service.    [facility] 01/24/2023 10:26 AM  ___________________________________________________________  CC: Shortness of breath  Interval History/Subjective: Patient continues to feel little better.  Urine output seem to be more robust yesterday with 2 L documented and some undocumented.  Creatinine slightly higher at 8.7.  Patient denies any uremic symptoms.  Continues on supplemental oxygen but trying to wean today   Medications:  Current Facility-Administered Medications  Medication Dose Route Frequency Provider Last Rate Last Admin   0.9 %  sodium chloride infusion   Intravenous PRN Wynetta Fines T, MD       amLODipine (NORVASC) tablet 10 mg  10 mg Oral QHS Wynetta Fines T, MD   10 mg at 01/23/23 2126   atorvastatin (LIPITOR) tablet 20 mg  20 mg Oral Daily Wynetta Fines T, MD   20 mg at 01/24/23 0902   azelastine (ASTELIN) 0.1 % nasal spray 2 spray  2 spray Each Nare BID Wynetta Fines T, MD   2 spray at 01/24/23 0903   carvedilol (COREG) tablet 25 mg  25 mg Oral BID WC Wynetta Fines T, MD   25 mg at 01/24/23 0723   cetirizine (ZYRTEC) tablet 5 mg  5 mg Oral QPM Wynetta Fines T, MD   5 mg at 01/23/23 1824   cloNIDine (CATAPRES) tablet 0.1 mg  0.1 mg Oral TID Donne Hazel, MD   0.1 mg at 01/24/23 0902   [START ON 01/25/2023] Darbepoetin Alfa (ARANESP) injection 100 mcg  100 mcg Subcutaneous  Q [id number] Reesa Chew, MD       DULoxetine (CYMBALTA) DR capsule 30 mg  30 mg Oral Daily Wynetta Fines T, MD   30 mg at 01/24/23 0902   famotidine (PEPCID) tablet 20 mg  20 mg Oral QHS Wynetta Fines T, MD   20 mg at 01/23/23 2126   ferrous sulfate tablet 325 mg  325 mg Oral Q breakfast Wynetta Fines T, MD   325 mg at 01/24/23 0723   fluticasone (FLONASE) 50 MCG/ACT nasal spray 2 spray  2 spray Each Nare Daily Lequita Halt, MD   2 spray at 01/24/23 0903   furosemide  (LASIX) 120 mg in dextrose 5 % 50 mL IVPB  120 mg Intravenous Q8H Roney Jaffe, MD 62 mL/hr at 01/24/23 0604 120 mg at 01/24/23 0604   gabapentin (NEURONTIN) capsule 100 mg  100 mg Oral QHS Donne Hazel, MD   100 mg at 01/23/23 2125   guaiFENesin ([place]) 12 hr tablet 1,200 mg  1,200 mg Oral BID Wynetta Fines T, MD   1,200 mg at 01/24/23 [id number]   hydrALAZINE (APRESOLINE) tablet 100 mg  100 mg Oral TID Wynetta Fines T, MD   100 mg at 01/24/23 0902   insulin aspart (novoLOG) injection 0-9 Units  0-9 Units Subcutaneous TID WC Wynetta Fines T, MD   1 Units at 01/23/23 0900   insulin glargine-yfgn (SEMGLEE) injection 15 Units  15 Units Subcutaneous BID Donne Hazel, MD   15 Units at 01/24/23 0901   ipratropium-albuterol (DUONEB) 0.5-2.5 (3) MG/3ML nebulizer solution 3 mL  3 mL Nebulization Q6H PRN Donne Hazel, MD       isosorbide mononitrate (IMDUR) 24 hr tablet 30 mg  30 mg Oral Daily Wynetta Fines T, MD   30 mg at 01/24/23 0902   labetalol (NORMODYNE) injection 10 mg  10 mg Intravenous Q4H PRN Lequita Halt, MD       lidocaine (LMX) 4 % cream   Topical Daily PRN Donne Hazel, MD       pantoprazole (PROTONIX) EC tablet 40 mg  40 mg Oral Daily Wynetta Fines T, MD   40 mg at 01/24/23 0902   promethazine (PHENERGAN) tablet 25 mg  25 mg Oral Q8H PRN Wynetta Fines T, MD   25 mg at 01/22/23 [id number]   sodium bicarbonate tablet 650 mg  650 mg Oral BID Lequita Halt, MD   650 mg at 01/24/23 0902   traZODone (DESYREL) tablet 50 mg  50 mg Oral QHS Lequita Halt, MD   50 mg at 01/23/23 2126      Review of Systems: 10 systems reviewed and negative except per interval history/subjective  Physical Exam: Vitals:   01/24/23 0841 01/24/23 0939  BP:  (!) 145/70  Pulse: 77 81  Resp: 18 18  Temp:  98.2 F (36.8 C)  SpO2: 99% (!) 88%   Total I/O In: 240 [P.O.:240] Out: -   Intake/Output Summary (Last 24 hours) at 01/24/2023 1026 Last data filed at 01/24/2023 0900 Gross per 24 hour  Intake 480 ml  Output  1100 ml  Net -620 ml   Constitutional: well-appearing, no acute distress ENMT: ears and nose without scars or lesions, MMM CV: normal rate, trace edema in the lower extremities Respiratory: Bilateral chest rise, normal work of breathing, trach present Gastrointestinal: soft, non-tender, no palpable masses or hernias Skin: no visible lesions or rashes Psych: alert, judgement/insight appropriate, appropriate mood and affect   Test  Results I personally reviewed new and old clinical labs and radiology tests Lab Results  Component Value Date   NA 138 01/24/2023   K 3.6 01/24/2023   CL 104 01/24/2023   CO2 19 (L) 01/24/2023   BUN 86 (H) 01/24/2023   CREATININE 8.65 (H) 01/24/2023   GFR 6.98 (LL) 10/27/2022   CALCIUM 7.7 (L) 01/24/2023   ALBUMIN 2.3 (L) 01/24/2023   PHOS 6.9 (H) 01/20/2023    CBC Recent Labs  Lab 01/18/23 1246 01/18/23 2205 01/22/23 0846 01/23/23 0439 01/24/23 0233  WBC 15.5*   < > 15.2* 12.0* 12.8*  NEUTROABS 11.4*  --   --   --   --   HGB 6.8*   < > 7.8* 7.2* 7.2*  HCT 21.2*   < > 24.7* 23.7* 22.1*  MCV 91.4   < > 90.1 94.0 90.9  PLT 298   < > 300 290 284   < > = values in this interval not displayed.

## 2023-01-25 DIAGNOSIS — N185 Chronic kidney disease, stage 5: Secondary | ICD-10-CM | POA: Diagnosis not present

## 2023-01-25 DIAGNOSIS — N179 Acute kidney failure, unspecified: Secondary | ICD-10-CM

## 2023-01-25 DIAGNOSIS — E1140 Type 2 diabetes mellitus with diabetic neuropathy, unspecified: Secondary | ICD-10-CM | POA: Insufficient documentation

## 2023-01-25 DIAGNOSIS — J189 Pneumonia, unspecified organism: Secondary | ICD-10-CM | POA: Diagnosis not present

## 2023-01-25 DIAGNOSIS — D649 Anemia, unspecified: Secondary | ICD-10-CM | POA: Insufficient documentation

## 2023-01-25 LAB — COMPREHENSIVE METABOLIC PANEL
ALT: 13 U/L (ref 0–44)
AST: 17 U/L (ref 15–41)
Albumin: 2.6 g/dL — ABNORMAL LOW (ref 3.5–5.0)
Alkaline Phosphatase: 123 U/L (ref 38–126)
Anion gap: 16 — ABNORMAL HIGH (ref 5–15)
BUN: 91 mg/dL — ABNORMAL HIGH (ref 6–20)
CO2: 19 mmol/L — ABNORMAL LOW (ref 22–32)
Calcium: 7.8 mg/dL — ABNORMAL LOW (ref 8.9–10.3)
Chloride: 102 mmol/L (ref 98–111)
Creatinine, Ser: 8.8 mg/dL — ABNORMAL HIGH (ref 0.44–1.00)
GFR, Estimated: 5 mL/min — ABNORMAL LOW (ref >60)
Glucose, Bld: 142 mg/dL — ABNORMAL HIGH (ref 70–99)
Potassium: 3.8 mmol/L (ref 3.5–5.1)
Sodium: 137 mmol/L (ref 135–145)
Total Bilirubin: 0.7 mg/dL (ref 0.3–1.2)
Total Protein: 6.8 g/dL (ref 6.5–8.1)

## 2023-01-25 LAB — CBC
HCT: 25.1 % — ABNORMAL LOW (ref 36.0–46.0)
Hemoglobin: 7.9 g/dL — ABNORMAL LOW (ref 12.0–15.0)
MCH: 28.6 pg (ref 26.0–34.0)
MCHC: 31.5 g/dL (ref 30.0–36.0)
MCV: 90.9 fL (ref 80.0–100.0)
Platelets: 329 10*3/uL (ref 150–400)
RBC: 2.76 MIL/uL — ABNORMAL LOW (ref 3.87–5.11)
RDW: 13.4 % (ref 11.5–15.5)
WBC: 14 10*3/uL — ABNORMAL HIGH (ref 4.0–10.5)
nRBC: 0 % (ref 0.0–0.2)

## 2023-01-25 LAB — GLUCOSE, CAPILLARY
Glucose-Capillary: 166 mg/dL — ABNORMAL HIGH (ref 70–99)
Glucose-Capillary: 115 mg/dL — ABNORMAL HIGH (ref 70–99)
Glucose-Capillary: 137 mg/dL — ABNORMAL HIGH (ref 70–99)

## 2023-01-25 LAB — MAGNESIUM: Magnesium: 2 mg/dL (ref 1.7–2.4)

## 2023-01-25 MED ORDER — TORSEMIDE 100 MG PO TABS
100.0000 mg | ORAL_TABLET | Freq: Two times a day (BID) | ORAL | Status: DC
  Administered 2023-01-25 – 2023-01-26 (×4): 100 mg via ORAL
  Filled 2023-01-25 (×5): qty 1

## 2023-01-25 MED ORDER — CAPSAICIN 0.025 % EX CREA
TOPICAL_CREAM | Freq: Every day | CUTANEOUS | Status: DC
  Filled 2023-01-25: qty 60

## 2023-01-25 MED ORDER — METOLAZONE 5 MG PO TABS
5.0000 mg | ORAL_TABLET | Freq: Once | ORAL | Status: AC
  Administered 2023-01-25: 5 mg via ORAL
  Filled 2023-01-25: qty 1

## 2023-01-25 NOTE — Progress Notes (Signed)
Occupational Therapy Treatment/Discharge Patient Details Name: Maureen Jones MRN: [id number] DOB: 1966/09/19 Today's Date: 01/25/2023   History of present illness 57 y.o. female admitted 2/7 with malaise, shortness of breath, and coughing up blood. Acute on chronic hypoxic respiratory failure, Acute on chronic symptomatic anemia, AKI on CKD stage V,  and Acute on chronic HFpEF.  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   OT comments  Focus on reinforcement of energy conservation, use of AE for LB ADLs and safe strategies to retrieve items from floor. Pt actively involved in education and denies any concerns/questions. No further skilled OT services needed at acute level or on DC.   Recommendations for follow up therapy are one component of a multi-disciplinary discharge planning process, led by the attending physician.  Recommendations may be updated based on patient status, additional functional criteria and insurance authorization.    Follow Up Recommendations  No OT follow up     Assistance Recommended at Discharge PRN  Patient can return home with the following      Equipment Recommendations  Other (comment) (AE for LB ADL, shower chair with pt planning to search online or communicate needs with insurance company)    Recommendations for Other Services      Precautions / Restrictions Precautions Precautions: Fall Precaution Comments: Monitor O2, trach Restrictions Weight Bearing Restrictions: No       Mobility Bed Mobility               General bed mobility comments: EOB on entry    Transfers Overall transfer level: Independent Equipment used: None Transfers: Sit to/from [employer] transfer comment: at bedside     Balance Overall balance assessment: Needs assistance Sitting-balance support: No upper extremity supported, Feet  supported Sitting balance-[name]: Good     Standing balance support: No upper extremity supported, During functional activity Standing balance-[name]: Fair Standing balance comment: Fair+                           ADL either performed or assessed with clinical judgement   ADL Overall ADL's : Modified independent                                       General ADL Comments: Pt reports comfort with AE use for ADLs, denied need to practice again. Faciliated ability to obtain items from floor using reacher vs squatting due to discomfort/difficulty breathing baseline when bending with trach. Reinforced energy conservation strategies for new senior apartment pt moving to w/ no questions noted    Extremity/Trunk Assessment Upper Extremity Assessment Upper Extremity Assessment: Overall WFL for tasks assessed   Lower Extremity Assessment Lower Extremity Assessment: Defer to PT evaluation        Vision   Vision Assessment?: No apparent visual deficits   Perception     Praxis      Cognition Arousal/Alertness: Awake/alert Behavior During Therapy: WFL for tasks assessed/performed Overall Cognitive Status: Within Functional Limits for tasks assessed  Exercises      Shoulder Instructions       General Comments      Pertinent Vitals/ Pain       Pain Assessment Pain Assessment: No/denies pain  Home Living                                          Prior Functioning/Environment              Frequency  Min 2X/week        Progress Toward Goals  OT Goals(current goals can now be found in the care plan section)  Progress towards OT goals: Goals met/education completed, patient discharged from OT  Acute Rehab OT Goals OT Goal Formulation: With patient Time For Goal Achievement: 02/03/23 Potential to Achieve Goals: Good ADL Goals Additional ADL Goal #1:  Pt will be knowledgeable in use of AE for LB ADLs.  Plan Discharge plan remains appropriate;All goals met and education completed, patient discharged from OT services    Co-evaluation                 AM-PAC OT "6 Clicks" Daily Activity     Outcome Measure   Help from another person eating meals?: None Help from another person taking care of personal grooming?: None Help from another person toileting, which includes using toliet, bedpan, or urinal?: None Help from another person bathing (including washing, rinsing, drying)?: None Help from another person to put on and taking off regular upper body clothing?: None Help from another person to put on and taking off regular lower body clothing?: None 6 Click Score: 24    End of Session    OT Visit Diagnosis: Other (comment)   Activity Tolerance Patient tolerated treatment well   Patient Left in bed;with call bell/phone within reach   Nurse Communication          Time: [id number] OT Time Calculation (min): 20 min  Charges: OT General Charges $OT Visit: 1 Visit OT Treatments $Self Care/Home Management : 8-22 mins  Malachy Chamber, OTR/L Acute Rehab Services Office: [phone]   Layla Maw 01/25/2023, 1:28 PM

## 2023-01-25 NOTE — Progress Notes (Signed)
Nephrology Follow-Up Consult note   Assessment/Recommendations: Maureen Jones is a/an 57 y.o. female with a past medical history significant for chronic hypoxic respiratory failure, CKD 5, DM2, morbid obesity, admitted for acute on chronic hypoxic respiratory failure.       Nonoliguric AKI on CKD 5: Baseline creatinine around 6.8-7.5 followed by Dr. Hollie Salk in the outpatient setting.  Creatinine slightly worse at this time with some volume overload.  Moderate response to diuretics so far.  There are a lot of barriers for this patient's care regarding nearing ESRD.  The patient cannot easily undergo hemodialysis in center because of her trach.  There has been tentative plan for home dialysis with Dr. Hollie Salk.  However the patient is planning to move to [place] in a couple weeks and so this plan would not be feasible.  Ultimately we will do our best to keep the patient off dialysis until she can moved to [place].  Once there she can find a way to get dialysis.  This is not an optimal plan but our options are limited.  She is starting to respond better to diuretics.  Continues to feel like she is volume overloaded -Switch to oral torsemide 100 mg twice daily, continue metolazone again today -Continue to monitor daily Cr, Dose meds for GFR -Monitor Daily I/Os, Daily weight  -Maintain MAP>65 for optimal renal perfusion.  -Avoid nephrotoxic medications including NSAIDs -Use synthetic opioids (Fentanyl/Dilaudid) if needed -Monitor closely for hemodialysis needs -Hold on access creation so as not to complicate potential discharge  Acute on chronic hypoxic respiratory failure: Likely multifactorial with some volume overload as well as CAP.  Antibiotics per primary.  Diuretics as above.  Has been able to wean off oxygen during the day  Anemia of CKD: Likely multifactorial but hemoglobin significantly low.  Recently received transfusion.  ESA also added on  Metabolic acidosis: Mild.  Continue to  monitor  Uncontrolled Diabetes Mellitus Type 2 with Hyperglycemia: Management per primary team   Recommendations conveyed to primary service.    [facility] 01/25/2023 12:29 PM  ___________________________________________________________  CC: Shortness of breath  Interval History/Subjective: Patient feels jittery today and also having some pain in her feet but otherwise no complaints.  Breathing is overall improved.  Feels like her volume status is improving but not optimized   Medications:  Current Facility-Administered Medications  Medication Dose Route Frequency Provider Last Rate Last Admin   0.9 %  sodium chloride infusion   Intravenous PRN Lequita Halt, MD 10 mL/hr at 01/25/23 0651 New Bag at 01/25/23 0651   amLODipine (NORVASC) tablet 10 mg  10 mg Oral QHS Wynetta Fines T, MD   10 mg at 01/24/23 2202   atorvastatin (LIPITOR) tablet 20 mg  20 mg Oral Daily Wynetta Fines T, MD   20 mg at 01/25/23 0906   azelastine (ASTELIN) 0.1 % nasal spray 2 spray  2 spray Each Nare BID Wynetta Fines T, MD   2 spray at 01/25/23 0907   capsaicin (ZOSTRIX) 0.025 % cream   Topical Daily Dwyane Dee, MD       carvedilol (COREG) tablet 25 mg  25 mg Oral BID WC Wynetta Fines T, MD   25 mg at 01/25/23 0904   cetirizine (ZYRTEC) tablet 5 mg  5 mg Oral QPM Wynetta Fines T, MD   5 mg at 01/24/23 1712   cloNIDine (CATAPRES) tablet 0.1 mg  0.1 mg Oral TID Donne Hazel, MD   0.1 mg at  01/25/23 0906   Darbepoetin Alfa (ARANESP) injection 100 mcg  100 mcg Subcutaneous Q Wed-1800 Reesa Chew, MD       DULoxetine (CYMBALTA) DR capsule 30 mg  30 mg Oral Daily Wynetta Fines T, MD   30 mg at 01/25/23 0904   famotidine (PEPCID) tablet 20 mg  20 mg Oral QHS Wynetta Fines T, MD   20 mg at 01/24/23 2203   ferrous sulfate tablet 325 mg  325 mg Oral Q breakfast Wynetta Fines T, MD   325 mg at 01/25/23 0904   fluticasone (FLONASE) 50 MCG/ACT nasal spray 2 spray  2 spray Each Nare Daily  Lequita Halt, MD   2 spray at 01/25/23 [id number]   gabapentin (NEURONTIN) capsule 100 mg  100 mg Oral QHS Donne Hazel, MD   100 mg at 01/24/23 2204   guaiFENesin ([place]IN[place]) 12 hr tablet 1,200 mg  1,200 mg Oral BID Wynetta Fines T, MD   1,200 mg at 01/25/23 [id number]   hydrALAZINE (APRESOLINE) tablet 100 mg  100 mg Oral TID Wynetta Fines T, MD   100 mg at 01/25/23 0905   insulin aspart (novoLOG) injection 0-9 Units  0-9 Units Subcutaneous TID WC Lequita Halt, MD   2 Units at 01/25/23 0908   insulin glargine-yfgn (SEMGLEE) injection 15 Units  15 Units Subcutaneous BID Donne Hazel, MD   15 Units at 01/25/23 0906   ipratropium-albuterol (DUONEB) 0.5-2.5 (3) MG/3ML nebulizer solution 3 mL  3 mL Nebulization Q6H PRN Donne Hazel, MD       isosorbide mononitrate (IMDUR) 24 hr tablet 30 mg  30 mg Oral Daily Wynetta Fines T, MD   30 mg at 01/25/23 0904   labetalol (NORMODYNE) injection 10 mg  10 mg Intravenous Q4H PRN Lequita Halt, MD       lidocaine (LMX) 4 % cream   Topical Daily PRN Donne Hazel, MD       pantoprazole (PROTONIX) EC tablet 40 mg  40 mg Oral Daily Wynetta Fines T, MD   40 mg at 01/25/23 0905   promethazine (PHENERGAN) tablet 25 mg  25 mg Oral Q8H PRN Wynetta Fines T, MD   25 mg at 01/22/23 [id number]   sodium bicarbonate tablet 650 mg  650 mg Oral BID Wynetta Fines T, MD   650 mg at 01/25/23 0904   torsemide (DEMADEX) tablet 100 mg  100 mg Oral BID Reesa Chew, MD   100 mg at 01/25/23 [id number]   traZODone (DESYREL) tablet 50 mg  50 mg Oral QHS Lequita Halt, MD   50 mg at 01/24/23 2202      Review of Systems: 10 systems reviewed and negative except per interval history/subjective  Physical Exam: Vitals:   01/25/23 0903 01/25/23 1212  BP: (!) 158/69   Pulse: 84 80  Resp: 16 14  Temp: 97.8 F (36.6 C)   SpO2: 92% 98%   Total I/O In: 240 [P.O.:240] Out: -   Intake/Output Summary (Last 24 hours) at 01/25/2023 1229 Last data filed at 01/25/2023 0800 Gross per 24 hour  Intake 960 ml   Output 1350 ml  Net -390 ml   Constitutional: well-appearing, no acute distress ENMT: ears and nose without scars or lesions, MMM CV: normal rate, trace edema in the lower extremities Respiratory: Bilateral chest rise, normal work of breathing, trach present Gastrointestinal: soft, non-tender, no palpable masses or hernias Skin: no visible lesions or rashes Psych: alert, judgement/insight appropriate, appropriate mood  and affect   Test Results I personally reviewed new and old clinical labs and radiology tests Lab Results  Component Value Date   NA 137 01/25/2023   K 3.8 01/25/2023   CL 102 01/25/2023   CO2 19 (L) 01/25/2023   BUN 91 (H) 01/25/2023   CREATININE 8.80 (H) 01/25/2023   GFR 6.98 (LL) 10/27/2022   CALCIUM 7.8 (L) 01/25/2023   ALBUMIN 2.6 (L) 01/25/2023   PHOS 6.9 (H) 01/20/2023    CBC Recent Labs  Lab 01/18/23 1246 01/18/23 2205 01/23/23 0439 01/24/23 0233 01/25/23 0151  WBC 15.5*   < > 12.0* 12.8* 14.0*  NEUTROABS 11.4*  --   --   --   --   HGB 6.8*   < > 7.2* 7.2* 7.9*  HCT 21.2*   < > 23.7* 22.1* 25.1*  MCV 91.4   < > 94.0 90.9 90.9  PLT 298   < > 290 284 329   < > = values in this interval not displayed.

## 2023-01-25 NOTE — Progress Notes (Signed)
Progress Note    Maureen Jones   [id number]  DOB: 09-Mar-1966  DOA: 01/18/2023     7 PCP: Flossie Buffy, NP  Initial CC: SOB, malaise   Hospital Course: Maureen Jones is a 57 y.o. 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   Pt presented with sob, streaky blood in sputum, reported fevers. Pt admitted for concerns of volume overload and CAP.  Interval History:  No events overnight.  Trach in place and patient talking and breathing comfortably this morning.  Her plan is to try and get back to [place] by the 25th.  Assessment and Plan:  AKI on CKD stage V -Significant fluid overload, management as above -Cr remains over baseline, currently 8.8 -Appreciate Nephrology assistance. Trying to hold off dialysis as possible until pt can move to [place] and once there, pt to find a way to get HD there - s/p lasix  Acute on chronic hypoxic respiratory failure -With chronic tracheostomy; wears O2 at night over trach or when napping -chest imaging reviewed. Patchy disease that can be both edema vs PNA -Suspect underlying CAP, given the worsening of productive cough and new fever -completed course of azithromycin and rocephin -Now weaned to baseline O2 requirements -Continued diuresis per Nephrology   Acute on chronic symptomatic anemia -Probably worsening of her baseline CKD related anemia -Pt s/p 1 unit PRBC this visit -Hgb seems stable at this time -cont to follow cbc trends   Hemoptysis -Likely related to pneumonia -No further episodes   Acute on chronic HFpEF -Continue diuresis per Nephrology   Chronic proteinuria -Appears to be in the nephrotic range -ACEI was discontinued November 2023 due to hyperkalemia.   IDDM with insulin resistance -On semglee 15 units bid -Glycemic  trends stable -Cont SSI   HTN uncontrolled -Continue amlodipine and Coreg -Continue hydralazine 100 mg 3 times daily, add Imdur -on clonidine 0.10m tid   Morbid obesity -BMI=38 -Cut down calorie intake   Abnormal sensorum -Recent events noted -CT head performed, neg -suspect related to renal disease   Diabetic neuropathy -Reported significant burning/tingling in B feet worse at night -giving trial of gabapentin [id number] -Have ordered trial of topical lidocaine as well   Hypomagnesemia -Replaced    Old records reviewed in assessment of this patient  DVT prophylaxis:  SCDs Start: 01/18/23 1612   Code Status:   Code Status: Full Code  Mobility Assessment (last 72 hours)     Mobility Assessment     Row Name 01/25/23 1212 01/24/23 2015 01/24/23 1444 01/24/23 1400 01/24/23 0800   Does patient have an order for bedrest or is patient medically unstable No - Continue assessment No - Continue assessment -- -- No - Continue assessment   What is the highest level of mobility based on the progressive mobility assessment? Level 5 (Walks with assist in room/hall) - Balance while stepping forward/back and can walk in room with assist - Complete Level 5 (Walks with assist in room/hall) - Balance while stepping forward/back and can walk in room with assist - Complete Level 5 (Walks with assist in room/hall) - Balance while stepping forward/back and can walk in room with assist - Complete Level 5 (Walks with assist in room/hall) - Balance while stepping forward/back and can walk in room with assist - Complete Level 5 (Walks with assist in room/hall) -  Balance while stepping forward/back and can walk in room with assist - Complete    Row Name 01/23/23 1950 01/23/23 1100 01/23/23 0858       Does patient have an order for bedrest or is patient medically unstable No - Continue assessment -- No - Continue assessment     What is the highest level of mobility based on the progressive mobility  assessment? Level 5 (Walks with assist in room/hall) - Balance while stepping forward/back and can walk in room with assist - Complete Level 5 (Walks with assist in room/hall) - Balance while stepping forward/back and can walk in room with assist - Complete Level 5 (Walks with assist in room/hall) - Balance while stepping forward/back and can walk in room with assist - Complete              Barriers to discharge:  Disposition Plan:  Home Status is: Inpt  Objective: Blood pressure (!) 158/69, pulse 80, temperature 97.8 F (36.6 C), temperature source Oral, resp. rate 14, height 5' 6"$  (1.676 m), weight 104.8 kg, SpO2 98 %.  Examination:  Physical Exam Constitutional:      General: She is not in acute distress.    Appearance: She is well-developed.  HENT:     Head: Normocephalic and atraumatic.     Mouth/Throat:     Mouth: Mucous membranes are moist.  Eyes:     Extraocular Movements: Extraocular movements intact.  Neck:     Comments: Trach in place Cardiovascular:     Rate and Rhythm: Normal rate.     Heart sounds: Normal heart sounds.  Pulmonary:     Effort: Pulmonary effort is normal. No respiratory distress.     Breath sounds: Normal breath sounds. No wheezing.  Abdominal:     General: Bowel sounds are normal. There is no distension.     Palpations: Abdomen is soft.     Tenderness: There is no abdominal tenderness.  Musculoskeletal:        General: Normal range of motion.     Cervical back: Normal range of motion and neck supple.     Comments: B/L LE edema noted  Skin:    General: Skin is warm and dry.  Neurological:     General: No focal deficit present.     Mental Status: She is alert.  Psychiatric:        Mood and Affect: Mood normal.      Consultants:  Nephrology   Procedures:    Data Reviewed: Results for orders placed or performed during the hospital encounter of 01/18/23 (from the past 24 hour(s))  Glucose, capillary     Status: Abnormal    Collection Time: 01/24/23  4:29 PM  Result Value Ref Range   Glucose-Capillary 181 (H) 70 - 99 mg/dL  Glucose, capillary     Status: Abnormal   Collection Time: 01/24/23  9:26 PM  Result Value Ref Range   Glucose-Capillary 196 (H) 70 - 99 mg/dL  Comprehensive metabolic panel     Status: Abnormal   Collection Time: 01/25/23  1:51 AM  Result Value Ref Range   Sodium 137 135 - 145 mmol/L   Potassium 3.8 3.5 - 5.1 mmol/L   Chloride 102 98 - 111 mmol/L   CO2 19 (L) 22 - 32 mmol/L   Glucose, Bld 142 (H) 70 - 99 mg/dL   BUN 91 (H) 6 - 20 mg/dL   Creatinine, Ser 8.80 (H) 0.44 - 1.00 mg/dL   Calcium 7.8 (L)  8.9 - 10.3 mg/dL   Total Protein 6.8 6.5 - 8.1 g/dL   Albumin 2.6 (L) 3.5 - 5.0 g/dL   AST 17 15 - 41 U/L   ALT 13 0 - 44 U/L   Alkaline Phosphatase 123 38 - 126 U/L   Total Bilirubin 0.7 0.3 - 1.2 mg/dL   GFR, Estimated 5 (L) >60 mL/min   Anion gap 16 (H) 5 - 15  CBC     Status: Abnormal   Collection Time: 01/25/23  1:51 AM  Result Value Ref Range   WBC 14.0 (H) 4.0 - 10.5 K/uL   RBC 2.76 (L) 3.87 - 5.11 MIL/uL   Hemoglobin 7.9 (L) 12.0 - 15.0 g/dL   HCT 25.1 (L) 36.0 - 46.0 %   MCV 90.9 80.0 - 100.0 fL   MCH 28.6 26.0 - 34.0 pg   MCHC 31.5 30.0 - 36.0 g/dL   RDW 13.4 11.5 - 15.5 %   Platelets 329 150 - 400 K/uL   nRBC 0.0 0.0 - 0.2 %  Magnesium     Status: None   Collection Time: 01/25/23  1:51 AM  Result Value Ref Range   Magnesium 2.0 1.7 - 2.4 mg/dL  Glucose, capillary     Status: Abnormal   Collection Time: 01/25/23  7:44 AM  Result Value Ref Range   Glucose-Capillary 166 (H) 70 - 99 mg/dL  Glucose, capillary     Status: Abnormal   Collection Time: 01/25/23 11:31 AM  Result Value Ref Range   Glucose-Capillary 115 (H) 70 - 99 mg/dL    I have reviewed pertinent nursing notes, vitals, labs, and images as necessary. I have ordered labwork to follow up on as indicated.  I have reviewed the last notes from staff over past 24 hours. I have discussed patient's care plan  and test results with nursing staff, CM/SW, and other staff as appropriate.  Time spent: Greater than 50% of the 55 minute visit was spent in counseling/coordination of care for the patient as laid out in the A&P.   LOS: 7 days   Dwyane Dee, MD [facility] 01/25/2023, 1:23 PM

## 2023-01-26 DIAGNOSIS — J189 Pneumonia, unspecified organism: Secondary | ICD-10-CM | POA: Diagnosis not present

## 2023-01-26 DIAGNOSIS — N179 Acute kidney failure, unspecified: Secondary | ICD-10-CM | POA: Diagnosis not present

## 2023-01-26 DIAGNOSIS — N185 Chronic kidney disease, stage 5: Secondary | ICD-10-CM | POA: Diagnosis not present

## 2023-01-26 LAB — GLUCOSE, CAPILLARY
Glucose-Capillary: 166 mg/dL — ABNORMAL HIGH (ref 70–99)
Glucose-Capillary: 131 mg/dL — ABNORMAL HIGH (ref 70–99)
Glucose-Capillary: 206 mg/dL — ABNORMAL HIGH (ref 70–99)
Glucose-Capillary: 295 mg/dL — ABNORMAL HIGH (ref 70–99)

## 2023-01-26 LAB — CBC WITH DIFFERENTIAL/PLATELET
Abs Immature Granulocytes: 0.09 10*3/uL — ABNORMAL HIGH (ref 0.00–0.07)
Basophils Absolute: 0 10*3/uL (ref 0.0–0.1)
Basophils Relative: 0 %
Eosinophils Absolute: 0.3 10*3/uL (ref 0.0–0.5)
Eosinophils Relative: 3 %
HCT: 24.2 % — ABNORMAL LOW (ref 36.0–46.0)
Hemoglobin: 7.8 g/dL — ABNORMAL LOW (ref 12.0–15.0)
Immature Granulocytes: 1 %
Lymphocytes Relative: 22 %
Lymphs Abs: 2.7 10*3/uL (ref 0.7–4.0)
MCH: 29.4 pg (ref 26.0–34.0)
MCHC: 32.2 g/dL (ref 30.0–36.0)
MCV: 91.3 fL (ref 80.0–100.0)
Monocytes Absolute: 1.1 10*3/uL — ABNORMAL HIGH (ref 0.1–1.0)
Monocytes Relative: 9 %
Neutro Abs: 7.9 10*3/uL — ABNORMAL HIGH (ref 1.7–7.7)
Neutrophils Relative %: 65 %
Platelets: 313 10*3/uL (ref 150–400)
RBC: 2.65 MIL/uL — ABNORMAL LOW (ref 3.87–5.11)
RDW: 13.4 % (ref 11.5–15.5)
WBC: 12.2 10*3/uL — ABNORMAL HIGH (ref 4.0–10.5)
nRBC: 0 % (ref 0.0–0.2)

## 2023-01-26 LAB — RENAL FUNCTION PANEL
Albumin: 2.5 g/dL — ABNORMAL LOW (ref 3.5–5.0)
Anion gap: 19 — ABNORMAL HIGH (ref 5–15)
BUN: 95 mg/dL — ABNORMAL HIGH (ref 6–20)
CO2: 19 mmol/L — ABNORMAL LOW (ref 22–32)
Calcium: 7.5 mg/dL — ABNORMAL LOW (ref 8.9–10.3)
Chloride: 100 mmol/L (ref 98–111)
Creatinine, Ser: 9 mg/dL — ABNORMAL HIGH (ref 0.44–1.00)
GFR, Estimated: 5 mL/min — ABNORMAL LOW (ref >60)
Glucose, Bld: 152 mg/dL — ABNORMAL HIGH (ref 70–99)
Phosphorus: 8.5 mg/dL — ABNORMAL HIGH (ref 2.5–4.6)
Potassium: 3.7 mmol/L (ref 3.5–5.1)
Sodium: 138 mmol/L (ref 135–145)

## 2023-01-26 LAB — MAGNESIUM: Magnesium: 1.9 mg/dL (ref 1.7–2.4)

## 2023-01-26 NOTE — Progress Notes (Signed)
Nephrology Follow-Up Consult note   Assessment/Recommendations: Maureen Jones is a/an 57 y.o. female with a past medical history significant for chronic hypoxic respiratory failure, CKD 5, DM2, morbid obesity, admitted for acute on chronic hypoxic respiratory failure.       Nonoliguric AKI on CKD 5: Baseline creatinine around 6.8-7.5 followed by Dr. Hollie Salk in the outpatient setting.  Creatinine slightly worse at this time with some volume overload.  Moderate response to diuretics so far.  There are a lot of barriers for this patient's care regarding nearing ESRD.  The patient cannot easily undergo hemodialysis in center because of her trach.  There has been tentative plan for home dialysis with Dr. Hollie Salk.  However the patient is planning to move to [place] in a couple weeks and so this plan would not be feasible.  Ultimately we will do our best to keep the patient off dialysis until she can moved to [place].  Once there she can find a way to get dialysis.  This is not an optimal plan but our options are limited.  Volume status has improved -Continue oral torsemide 100 mg twice daily, hold further metolazone -If crt stabilizes and weight is maintained on oral diuretics she could discharge -No uremic symptoms -Continue to monitor daily Cr, Dose meds for GFR -Monitor Daily I/Os, Daily weight  -Maintain MAP>65 for optimal renal perfusion.  -Avoid nephrotoxic medications including NSAIDs -Use synthetic opioids (Fentanyl/Dilaudid) if needed -Monitor closely for hemodialysis needs -Hold on access creation so as not to complicate potential discharge  Acute on chronic hypoxic respiratory failure: Likely multifactorial with some volume overload as well as CAP.  Antibiotics per primary.  Diuretics as above.  Respiratory status overall improved  Anemia of CKD: Likely multifactorial but hemoglobin significantly low.  Recently received transfusion.  ESA also added on  Metabolic acidosis: Mild.   Continue to monitor  Uncontrolled Diabetes Mellitus Type 2 with Hyperglycemia: Management per primary team   Recommendations conveyed to primary service.    [facility] 01/26/2023 10:21 AM  ___________________________________________________________  CC: Shortness of breath  Interval History/Subjective: Patient is feeling better today.  States she was on room air yesterday with no issues.  She is on 6 L right now and does not feel like she needs it.  Does feel like her volume status is overall improved.  Creatinine slightly higher at 9.   Medications:  Current Facility-Administered Medications  Medication Dose Route Frequency Provider Last Rate Last Admin   0.9 %  sodium chloride infusion   Intravenous PRN Wynetta Fines T, MD 10 mL/hr at 01/25/23 0651 New Bag at 01/25/23 0651   amLODipine (NORVASC) tablet 10 mg  10 mg Oral QHS Wynetta Fines T, MD   10 mg at 01/25/23 2159   atorvastatin (LIPITOR) tablet 20 mg  20 mg Oral Daily Wynetta Fines T, MD   20 mg at 01/26/23 1000   azelastine (ASTELIN) 0.1 % nasal spray 2 spray  2 spray Each Nare BID Wynetta Fines T, MD   2 spray at 01/26/23 1001   capsaicin (ZOSTRIX) 0.025 % cream   Topical Daily Dwyane Dee, MD   Given at 01/26/23 1001   carvedilol (COREG) tablet 25 mg  25 mg Oral BID WC Wynetta Fines T, MD   25 mg at 01/26/23 1000   cetirizine (ZYRTEC) tablet 5 mg  5 mg Oral QPM Wynetta Fines T, MD   5 mg at 01/25/23 1706   cloNIDine (CATAPRES) tablet 0.1 mg  0.1 mg Oral TID Donne Hazel, MD   0.1 mg at 01/26/23 1000   Darbepoetin Alfa (ARANESP) injection 100 mcg  100 mcg Subcutaneous Q Wed-1800 Reesa Chew, MD   100 mcg at 01/25/23 1706   DULoxetine (CYMBALTA) DR capsule 30 mg  30 mg Oral Daily Wynetta Fines T, MD   30 mg at 01/26/23 1000   famotidine (PEPCID) tablet 20 mg  20 mg Oral QHS Wynetta Fines T, MD   20 mg at 01/25/23 2201   ferrous sulfate tablet 325 mg  325 mg Oral Q breakfast Wynetta Fines T, MD   325  mg at 01/26/23 1000   fluticasone (FLONASE) 50 MCG/ACT nasal spray 2 spray  2 spray Each Nare Daily Wynetta Fines T, MD   2 spray at 01/26/23 1001   gabapentin (NEURONTIN) capsule 100 mg  100 mg Oral QHS Donne Hazel, MD   100 mg at 01/25/23 2202   guaiFENesin (MUCINEX) 12 hr tablet 1,200 mg  1,200 mg Oral BID Wynetta Fines T, MD   1,200 mg at 01/26/23 1000   hydrALAZINE (APRESOLINE) tablet 100 mg  100 mg Oral TID Wynetta Fines T, MD   100 mg at 01/26/23 1000   insulin aspart (novoLOG) injection 0-9 Units  0-9 Units Subcutaneous TID WC Lequita Halt, MD   2 Units at 01/26/23 0859   insulin glargine-yfgn (SEMGLEE) injection 15 Units  15 Units Subcutaneous BID Donne Hazel, MD   15 Units at 01/26/23 0959   ipratropium-albuterol (DUONEB) 0.5-2.5 (3) MG/3ML nebulizer solution 3 mL  3 mL Nebulization Q6H PRN Donne Hazel, MD       isosorbide mononitrate (IMDUR) 24 hr tablet 30 mg  30 mg Oral Daily Wynetta Fines T, MD   30 mg at 01/26/23 1000   labetalol (NORMODYNE) injection 10 mg  10 mg Intravenous Q4H PRN Wynetta Fines T, MD       lidocaine (LMX) 4 % cream   Topical Daily PRN Donne Hazel, MD       pantoprazole (PROTONIX) EC tablet 40 mg  40 mg Oral Daily Wynetta Fines T, MD   40 mg at 01/26/23 1000   promethazine (PHENERGAN) tablet 25 mg  25 mg Oral Q8H PRN Wynetta Fines T, MD   25 mg at 01/22/23 [id number]   sodium bicarbonate tablet 650 mg  650 mg Oral BID Wynetta Fines T, MD   650 mg at 01/26/23 1000   torsemide (DEMADEX) tablet 100 mg  100 mg Oral BID Reesa Chew, MD   100 mg at 01/26/23 1000   traZODone (DESYREL) tablet 50 mg  50 mg Oral QHS Wynetta Fines T, MD   50 mg at 01/25/23 2201      Review of Systems: 10 systems reviewed and negative except per interval history/subjective  Physical Exam: Vitals:   01/26/23 0755 01/26/23 0948  BP:  (!) 150/77  Pulse: 85 65  Resp: 17 18  Temp:  98.8 F (37.1 C)  SpO2: 93% 100%   No intake/output data recorded.  Intake/Output Summary (Last 24  hours) at 01/26/2023 1021 Last data filed at 01/26/2023 0425 Gross per 24 hour  Intake 900 ml  Output 1900 ml  Net -1000 ml   Constitutional: well-appearing, no acute distress ENMT: ears and nose without scars or lesions, MMM CV: normal rate, trace edema in the lower extremities Respiratory: Bilateral chest rise, normal work of breathing, trach present Gastrointestinal: soft, non-tender, no palpable masses or hernias  Skin: no visible lesions or rashes Psych: alert, judgement/insight appropriate, appropriate mood and affect   Test Results I personally reviewed new and old clinical labs and radiology tests Lab Results  Component Value Date   NA 138 01/26/2023   K 3.7 01/26/2023   CL 100 01/26/2023   CO2 19 (L) 01/26/2023   BUN 95 (H) 01/26/2023   CREATININE 9.00 (H) 01/26/2023   GFR 6.98 (LL) 10/27/2022   CALCIUM 7.5 (L) 01/26/2023   ALBUMIN 2.5 (L) 01/26/2023   PHOS 8.5 (H) 01/26/2023    CBC Recent Labs  Lab 01/24/23 0233 01/25/23 0151 01/26/23 0150  WBC 12.8* 14.0* 12.2*  NEUTROABS  --   --  7.9*  HGB 7.2* 7.9* 7.8*  HCT 22.1* 25.1* 24.2*  MCV 90.9 90.9 91.3  PLT [phone]

## 2023-01-26 NOTE — Consult Note (Signed)
   [facility] CM Inpatient Consult   01/26/2023  Maureen Jones [birth year] [id number]  [name]  Accountable Care Organization [ACO] Patient: [employer]  Primary Care Provider:  Flossie Buffy, NP   Patient is currently active with [place] [facility] for chronic disease management services.  Patient has been engaged by a [facility] RNCM and [facility] Education officer, museum.  Our community based plan of care has focused on disease management and community resource support.    Met with the patient at the bedside regarding ongoing service needs patient with trach states she is currently planning to leave this area to live in [place] with senior housing apartments.  She states her ongoing issue is to find more assistance with her electric bill due to the equipment needed for her medical care needs.    Plan: Will update current [facility] Social Worker regarding request for a sooner appointment which is currently for 02/06/23.  Patient is planning to move on 02/05/23. Continue to follow.  Of note, [facility] Care Management services does not replace or interfere with any services that are needed or arranged by inpatient [facility] care management team.   For additional questions or referrals please contact:  Natividad Brood, RN BSN [place]  [phone] business mobile phone Toll free office [phone]  *[place]  [phone] Fax number: [phone] [name]

## 2023-01-26 NOTE — Progress Notes (Signed)
Mobility Specialist Progress Note   01/26/23 1100  Mobility  Activity Ambulated with assistance in hallway  Level of Assistance Contact guard assist, steadying assist  Assistive Device Cane  Distance Ambulated (ft) 500 ft  Activity Response Tolerated well  Mobility Referral Yes  $Mobility charge 1 Mobility   Pre Mobility: 96% SpO2 on 2LO2 During Mobility: 91% SpO2 on RA Post Mobility: 95% SpO2 on 2LO2  Received in bed having no complaints and agreeable. Pt ambulating more steadily today w/ SPC in R hand but still having slight unsteadiness w/ turns. Ambulated at about ~91% SpO2 on RA w/ momentary drops to 89% but recovering quickly. Returned back to room w/o fault. Left sitting EOB w/ call bell in reach and needs met.   [place] Mobility Specialist Please contact via SecureChat or  Rehab office at [phone]

## 2023-01-26 NOTE — Progress Notes (Signed)
Progress Note    Maureen Jones   [id number]  DOB: August 27, 1966  DOA: 01/18/2023     8 PCP: Flossie Buffy, NP  Initial CC: SOB, malaise   Hospital Course: [name] is a 57 y.o. 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   Pt presented with sob, streaky blood in sputum, reported fevers. Pt admitted for concerns of volume overload and CAP.  Interval History:  No events overnight.  Still feeling okay today and has ambulated with mobility.  Goal is still trying to discharge without HD being initiated.   Assessment and Plan:  AKI on CKD stage V -Significant fluid overload, management as above -Cr remains over baseline, currently 8.8 -Appreciate Nephrology assistance. Trying to hold off dialysis as possible until pt can move to [place] and once there, pt to find a way to get HD there - s/p lasix -Now on torsemide per nephrology.  Metolazone discontinued - Renal function not significantly worse/changed today.  Hopefully is plateauing  Acute on chronic hypoxic respiratory failure -With chronic tracheostomy; wears O2 at night over trach or when napping -chest imaging reviewed. Patchy disease that can be both edema vs PNA -Suspect underlying CAP, given the worsening of productive cough and new fever -completed course of azithromycin and rocephin -Now weaned to baseline O2 requirements -Continued diuresis per Nephrology   Acute on chronic symptomatic anemia -Probably worsening of her baseline CKD related anemia -Pt s/p 1 unit PRBC this visit -Hgb seems stable at this time -cont to follow cbc trends   Hemoptysis -Likely related to pneumonia -No further episodes   Acute on chronic HFpEF -Continue diuresis per Nephrology   Chronic proteinuria -Appears to be in the nephrotic  range -ACEI was discontinued November 2023 due to hyperkalemia.   IDDM with insulin resistance -On semglee 15 units bid -Glycemic trends stable -Cont SSI   HTN uncontrolled -Continue amlodipine and Coreg -Continue hydralazine 100 mg 3 times daily, add Imdur -on clonidine 0.71m tid   Morbid obesity -BMI=38 -Cut down calorie intake   Abnormal sensorum -Recent events noted -CT head performed, neg -suspect related to renal disease   Diabetic neuropathy -Reported significant burning/tingling in B feet worse at night -giving trial of gabapentin [id number] -Have ordered trial of topical lidocaine as well   Hypomagnesemia -Replaced    Old records reviewed in assessment of this patient  DVT prophylaxis:  SCDs Start: 01/18/23 1612   Code Status:   Code Status: Full Code  Mobility Assessment (last 72 hours)     Mobility Assessment     Row Name 01/26/23 0801 01/26/23 0800 01/25/23 2015 01/25/23 1300 01/25/23 1212   Does patient have an order for bedrest or is patient medically unstable No - Continue assessment No - Continue assessment No - Continue assessment -- No - Continue assessment   What is the highest level of mobility based on the progressive mobility assessment? Level 5 (Walks with assist in room/hall) - Balance while stepping forward/back and can walk in room with assist - Complete Level 5 (Walks with assist in room/hall) - Balance while stepping forward/back and can walk in room with assist - Complete Level 5 (Walks with assist in room/hall) - Balance while stepping forward/back and can walk in room with assist - Complete Level 5 (Walks with assist in room/hall) - Balance while  stepping forward/back and can walk in room with assist - Complete Level 5 (Walks with assist in room/hall) - Balance while stepping forward/back and can walk in room with assist - Complete    Row Name 01/24/23 2015 01/24/23 1444 01/24/23 1400 01/24/23 0800 01/23/23 1950   Does patient have an  order for bedrest or is patient medically unstable No - Continue assessment -- -- No - Continue assessment No - Continue assessment   What is the highest level of mobility based on the progressive mobility assessment? Level 5 (Walks with assist in room/hall) - Balance while stepping forward/back and can walk in room with assist - Complete Level 5 (Walks with assist in room/hall) - Balance while stepping forward/back and can walk in room with assist - Complete Level 5 (Walks with assist in room/hall) - Balance while stepping forward/back and can walk in room with assist - Complete Level 5 (Walks with assist in room/hall) - Balance while stepping forward/back and can walk in room with assist - Complete Level 5 (Walks with assist in room/hall) - Balance while stepping forward/back and can walk in room with assist - Complete            Barriers to discharge:  Disposition Plan:  Home Status is: Inpt  Objective: Blood pressure (!) 150/77, pulse 72, temperature 98.8 F (37.1 C), temperature source Oral, resp. rate 18, height 5' 6"$  (1.676 m), weight 100.1 kg, SpO2 94 %.  Examination:  Physical Exam Constitutional:      General: She is not in acute distress.    Appearance: She is well-developed.  HENT:     Head: Normocephalic and atraumatic.     Mouth/Throat:     Mouth: Mucous membranes are moist.  Eyes:     Extraocular Movements: Extraocular movements intact.  Neck:     Comments: Trach in place Cardiovascular:     Rate and Rhythm: Normal rate.     Heart sounds: Normal heart sounds.  Pulmonary:     Effort: Pulmonary effort is normal. No respiratory distress.     Breath sounds: Normal breath sounds. No wheezing.  Abdominal:     General: Bowel sounds are normal. There is no distension.     Palpations: Abdomen is soft.     Tenderness: There is no abdominal tenderness.  Musculoskeletal:        General: Normal range of motion.     Cervical back: Normal range of motion and neck supple.      Comments: B/L LE edema noted  Skin:    General: Skin is warm and dry.  Neurological:     General: No focal deficit present.     Mental Status: She is alert.  Psychiatric:        Mood and Affect: Mood normal.      Consultants:  Nephrology   Procedures:    Data Reviewed: Results for orders placed or performed during the hospital encounter of 01/18/23 (from the past 24 hour(s))  Glucose, capillary     Status: Abnormal   Collection Time: 01/25/23  4:28 PM  Result Value Ref Range   Glucose-Capillary 137 (H) 70 - 99 mg/dL  CBC with Differential/Platelet     Status: Abnormal   Collection Time: 01/26/23  1:50 AM  Result Value Ref Range   WBC 12.2 (H) 4.0 - 10.5 K/uL   RBC 2.65 (L) 3.87 - 5.11 MIL/uL   Hemoglobin 7.8 (L) 12.0 - 15.0 g/dL   HCT 24.2 (L) 36.0 - 46.0 %  MCV 91.3 80.0 - 100.0 fL   MCH 29.4 26.0 - 34.0 pg   MCHC 32.2 30.0 - 36.0 g/dL   RDW 13.4 11.5 - 15.5 %   Platelets 313 150 - 400 K/uL   nRBC 0.0 0.0 - 0.2 %   Neutrophils Relative % 65 %   Neutro Abs 7.9 (H) 1.7 - 7.7 K/uL   Lymphocytes Relative 22 %   Lymphs Abs 2.7 0.7 - 4.0 K/uL   Monocytes Relative 9 %   Monocytes Absolute 1.1 (H) 0.1 - 1.0 K/uL   Eosinophils Relative 3 %   Eosinophils Absolute 0.3 0.0 - 0.5 K/uL   Basophils Relative 0 %   Basophils Absolute 0.0 0.0 - 0.1 K/uL   Immature Granulocytes 1 %   Abs Immature Granulocytes 0.09 (H) 0.00 - 0.07 K/uL  Magnesium     Status: None   Collection Time: 01/26/23  1:50 AM  Result Value Ref Range   Magnesium 1.9 1.7 - 2.4 mg/dL  Renal function panel     Status: Abnormal   Collection Time: 01/26/23  1:50 AM  Result Value Ref Range   Sodium 138 135 - 145 mmol/L   Potassium 3.7 3.5 - 5.1 mmol/L   Chloride 100 98 - 111 mmol/L   CO2 19 (L) 22 - 32 mmol/L   Glucose, Bld 152 (H) 70 - 99 mg/dL   BUN 95 (H) 6 - 20 mg/dL   Creatinine, Ser 9.00 (H) 0.44 - 1.00 mg/dL   Calcium 7.5 (L) 8.9 - 10.3 mg/dL   Phosphorus 8.5 (H) 2.5 - 4.6 mg/dL   Albumin 2.5 (L)  3.5 - 5.0 g/dL   GFR, Estimated 5 (L) >60 mL/min   Anion gap 19 (H) 5 - 15  Glucose, capillary     Status: Abnormal   Collection Time: 01/26/23  8:15 AM  Result Value Ref Range   Glucose-Capillary 166 (H) 70 - 99 mg/dL  Glucose, capillary     Status: Abnormal   Collection Time: 01/26/23 11:21 AM  Result Value Ref Range   Glucose-Capillary 131 (H) 70 - 99 mg/dL    I have reviewed pertinent nursing notes, vitals, labs, and images as necessary. I have ordered labwork to follow up on as indicated.  I have reviewed the last notes from staff over past 24 hours. I have discussed patient's care plan and test results with nursing staff, CM/SW, and other staff as appropriate.  Time spent: Greater than 50% of the 55 minute visit was spent in counseling/coordination of care for the patient as laid out in the A&P.   LOS: 8 days   Dwyane Dee, MD [facility] 01/26/2023, 3:47 PM

## 2023-01-27 DIAGNOSIS — N179 Acute kidney failure, unspecified: Secondary | ICD-10-CM | POA: Diagnosis not present

## 2023-01-27 DIAGNOSIS — J189 Pneumonia, unspecified organism: Secondary | ICD-10-CM | POA: Diagnosis not present

## 2023-01-27 DIAGNOSIS — N185 Chronic kidney disease, stage 5: Secondary | ICD-10-CM | POA: Diagnosis not present

## 2023-01-27 LAB — CBC WITH DIFFERENTIAL/PLATELET
Abs Immature Granulocytes: 0.1 10*3/uL — ABNORMAL HIGH (ref 0.00–0.07)
Basophils Absolute: 0.1 10*3/uL (ref 0.0–0.1)
Basophils Relative: 1 %
Eosinophils Absolute: 0.3 10*3/uL (ref 0.0–0.5)
Eosinophils Relative: 3 %
HCT: 24.5 % — ABNORMAL LOW (ref 36.0–46.0)
Hemoglobin: 7.7 g/dL — ABNORMAL LOW (ref 12.0–15.0)
Immature Granulocytes: 1 %
Lymphocytes Relative: 23 %
Lymphs Abs: 2.4 10*3/uL (ref 0.7–4.0)
MCH: 28.6 pg (ref 26.0–34.0)
MCHC: 31.4 g/dL (ref 30.0–36.0)
MCV: 91.1 fL (ref 80.0–100.0)
Monocytes Absolute: 1.1 10*3/uL — ABNORMAL HIGH (ref 0.1–1.0)
Monocytes Relative: 11 %
Neutro Abs: 6.6 10*3/uL (ref 1.7–7.7)
Neutrophils Relative %: 61 %
Platelets: 291 10*3/uL (ref 150–400)
RBC: 2.69 MIL/uL — ABNORMAL LOW (ref 3.87–5.11)
RDW: 13.5 % (ref 11.5–15.5)
WBC: 10.6 10*3/uL — ABNORMAL HIGH (ref 4.0–10.5)
nRBC: 0 % (ref 0.0–0.2)

## 2023-01-27 LAB — RENAL FUNCTION PANEL
Albumin: 2.5 g/dL — ABNORMAL LOW (ref 3.5–5.0)
Anion gap: 16 — ABNORMAL HIGH (ref 5–15)
BUN: 106 mg/dL — ABNORMAL HIGH (ref 6–20)
CO2: 21 mmol/L — ABNORMAL LOW (ref 22–32)
Calcium: 7.4 mg/dL — ABNORMAL LOW (ref 8.9–10.3)
Chloride: 99 mmol/L (ref 98–111)
Creatinine, Ser: 9.61 mg/dL — ABNORMAL HIGH (ref 0.44–1.00)
GFR, Estimated: 4 mL/min — ABNORMAL LOW (ref >60)
Glucose, Bld: 210 mg/dL — ABNORMAL HIGH (ref 70–99)
Phosphorus: 8.7 mg/dL — ABNORMAL HIGH (ref 2.5–4.6)
Potassium: 3.5 mmol/L (ref 3.5–5.1)
Sodium: 136 mmol/L (ref 135–145)

## 2023-01-27 LAB — GLUCOSE, CAPILLARY
Glucose-Capillary: 170 mg/dL — ABNORMAL HIGH (ref 70–99)
Glucose-Capillary: 188 mg/dL — ABNORMAL HIGH (ref 70–99)
Glucose-Capillary: 191 mg/dL — ABNORMAL HIGH (ref 70–99)
Glucose-Capillary: 230 mg/dL — ABNORMAL HIGH (ref 70–99)

## 2023-01-27 LAB — MAGNESIUM: Magnesium: 1.9 mg/dL (ref 1.7–2.4)

## 2023-01-27 MED ORDER — INSULIN ASPART 100 UNIT/ML IJ SOLN
0.0000 [IU] | Freq: Three times a day (TID) | INTRAMUSCULAR | Status: DC
  Administered 2023-01-27: 3 [IU] via SUBCUTANEOUS
  Administered 2023-01-27 – 2023-01-28 (×2): 2 [IU] via SUBCUTANEOUS
  Administered 2023-01-28: 1 [IU] via SUBCUTANEOUS

## 2023-01-27 MED ORDER — TORSEMIDE 100 MG PO TABS: 100.0000 mg | ORAL_TABLET | Freq: Two times a day (BID) | ORAL | Status: DC

## 2023-01-27 MED ORDER — TORSEMIDE 20 MG PO TABS
60.0000 mg | ORAL_TABLET | Freq: Two times a day (BID) | ORAL | Status: DC
  Administered 2023-01-28: 60 mg via ORAL
  Filled 2023-01-27: qty 3

## 2023-01-27 NOTE — TOC Progression Note (Signed)
Transition of Care [facility]) - Initial/Assessment Note    Patient Details  Name: Maureen Jones MRN: [id number] Date of Birth: 01-16-1966  [facility]) CM/SW Contact:    Milinda Antis, LCSWA Phone Number: 01/27/2023, 1:25 PM  Clinical Narrative:                 LCSW met with patient at bedside.  Patient reports plans to move to [place] after discharge.  LCSW informed the patient of how to retrieve medical records after discharge.  TOC following.   Expected Discharge Plan: [place] Barriers to Discharge: Continued Medical Work up   Patient Goals and CMS Choice Patient states their goals for this hospitalization and ongoing recovery are:: To return home CMS Medicare.gov Compare Post Acute Care list provided to:: Patient Choice offered to / list presented to : Patient      Expected Discharge Plan and Services In-house Referral: Clinical Social Work Discharge Planning Services: CM Consult   Living arrangements for the past 2 months: Single Family Home                 DME Arranged: Kasandra Knudsen DME Agency: [employer] Date DME Agency Contacted: 01/20/23 Time DME Agency Contacted: 1620 Representative spoke with at DME Agency: Erasmo Downer HH Arranged: PT [place]: [place] Date [place]: 01/24/23 Time [place]: 1107 Representative spoke with at [place]: Claiborne Billings  Prior Living Arrangements/Services Living arrangements for the past 2 months: [place] with:: Other (Comment) (god-daughter) Patient language and need for interpreter reviewed:: Yes Do you feel safe going back to the place where you live?: Yes      Need for Family Participation in Patient Care: Yes (Comment) Care giver support system in place?: Yes (comment)   Criminal Activity/Legal Involvement Pertinent to Current Situation/Hospitalization: No - Comment as needed  Activities of Daily Living Home Assistive Devices/Equipment: None ADL  Screening (condition at time of admission) Patient's cognitive ability adequate to safely complete daily activities?: Yes Is the patient deaf or have difficulty hearing?: No Does the patient have difficulty seeing, even when wearing glasses/contacts?: No Does the patient have difficulty concentrating, remembering, or making decisions?: No Patient able to express need for assistance with ADLs?: Yes Does the patient have difficulty dressing or bathing?: No Independently performs ADLs?: Yes (appropriate for developmental age) Does the patient have difficulty walking or climbing stairs?: No Weakness of Legs: None Weakness of Arms/Hands: None  Permission Sought/Granted Permission sought to share information with : Case Manager, Customer service manager, Family Supports Permission granted to share information with : Yes, Verbal Permission Granted     Permission granted to share info w AGENCY: DME and Home health agencies        Emotional Assessment Appearance:: Appears stated age Attitude/Demeanor/Rapport: Engaged, Self-Confident Affect (typically observed): Accepting Orientation: : Oriented to Situation, Oriented to  Time, Oriented to Place, Oriented to Self Alcohol / Substance Use: Not Applicable Psych Involvement: No (comment)  Admission diagnosis:  CAP (community acquired pneumonia) [J18.9] Hemoptysis [R04.2] Symptomatic anemia [D64.9] Patient Active Problem List   Diagnosis Date Noted   Normocytic anemia 01/25/2023   Hypomagnesemia 01/25/2023   Diabetic neuropathy ([place]) 01/25/2023   Sinusitis 12/01/2022   Pressure injury due to medical device 12/01/2022   Acute renal failure superimposed on stage 5 chronic kidney disease, not on chronic dialysis ([place]) 10/05/2022   LPRD (laryngopharyngeal reflux disease) 09/27/2022   Morbid (severe) obesity due to excess calories ([place]) 07/22/2022  Difficulty with speech 07/05/2022   Tracheostomy status (HCC)    Subglottic stenosis     Nausea & vomiting 02/13/2022   CAP (community acquired pneumonia) due to Pneumococcus ([place]) 12/06/2021   CKD (chronic kidney disease) stage 5, GFR less than 15 ml/min ([place]) 11/22/2021   Mood disorder ([place]) 11/22/2021   Tracheostomy dependence ([place])    Subglottic edema    Vocal cord dysfunction    Endotracheal tube present    Bilateral carpal tunnel syndrome 11/15/2019   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   Insomnia 11/15/2019   Mild intermittent asthma without complication [id number]   OSA (obstructive sleep apnea) 11/15/2019   Seasonal allergic rhinitis 11/15/2019   Tobacco abuse 11/15/2019   DM (diabetes mellitus) ([place]) 11/15/2019   PCP:  Flossie Buffy, NP Pharmacy:   [id number] [place] [address]Phone: [phone]:[phone]    Social Determinants of Health (SDOH) Social History: SDOH Screenings   Transportation Needs: No Transportation Needs (12/21/2022)  Depression (PHQ2-9): Medium Risk (01/02/2023)  Physical Activity: Inactive (12/20/2022)  Stress: Stress Concern Present (01/02/2023)  Tobacco Use: Medium Risk (11/23/2022)   SDOH Interventions:     Readmission Risk Interventions    12/13/2021   12:35 PM 11/15/2021   10:13 AM  Readmission Risk Prevention Plan  Transportation Screening Complete Complete  PCP or Specialist Appt within 3-5 Days Complete Complete  HRI or [place] Complete  Social Work Consult for [place]Counseling Complete Patient refused  Palliative Care Screening Not Applicable Not Applicable  Medication Review (Press photographer Complete Complete

## 2023-01-27 NOTE — Progress Notes (Signed)
Nephrology Follow-Up Consult note   Assessment/Recommendations: Maureen Jones is a/an 57 y.o. female with a past medical history significant for chronic hypoxic respiratory failure, CKD 5, DM2, morbid obesity, admitted for acute on chronic hypoxic respiratory failure.       Nonoliguric AKI on CKD 5: Baseline creatinine around 6.8-7.5 followed by Dr. Hollie Salk in the outpatient setting.  Creatinine has worsened during this time with diuresis but no obvious uremic symptoms.  There are a lot of barriers for this patient's care regarding nearing ESRD.  The patient cannot easily undergo hemodialysis in center because of her trach.  There has been tentative plan for home dialysis with Dr. Hollie Salk.  However the patient is planning to move to [place] in a couple weeks and so this plan would not be feasible.  Ultimately we will do our best to keep the patient off dialysis until she can moved to [place].  Once there she can find a way to get dialysis.  This is not an optimal plan but our options are limited.  Volume status has improved -Hold today and restarted 60 mg twice daily tomorrow -If crt stabilizes and weight is maintained on oral diuretics she could discharge -No uremic symptoms -Continue to monitor daily Cr, Dose meds for GFR -Monitor Daily I/Os, Daily weight  -Maintain MAP>65 for optimal renal perfusion.  -Avoid nephrotoxic medications including NSAIDs -Use synthetic opioids (Fentanyl/Dilaudid) if needed -Monitor closely for hemodialysis needs -Hold on access creation so as not to complicate potential discharge  Acute on chronic hypoxic respiratory failure: Likely multifactorial with some volume overload as well as CAP.  Antibiotics per primary.  Diuretics as above.  Respiratory status overall improved  Anemia of CKD: Likely multifactorial but hemoglobin significantly low.  Recently received transfusion.  ESA also added on  Metabolic acidosis: Mild.  Continue to monitor  Uncontrolled  Diabetes Mellitus Type 2 with Hyperglycemia: Management per primary team   Recommendations conveyed to primary service.    [facility] 01/27/2023 10:52 AM  ___________________________________________________________  CC: Shortness of breath  Interval History/Subjective: Patient feels well but still having jittering.  Her gabapentin helps with this.  Otherwise feels well without nausea or vomiting.  Appetite is stable.   Medications:  Current Facility-Administered Medications  Medication Dose Route Frequency Provider Last Rate Last Admin   0.9 %  sodium chloride infusion   Intravenous PRN Wynetta Fines T, MD 10 mL/hr at 01/25/23 0651 New Bag at 01/25/23 0651   amLODipine (NORVASC) tablet 10 mg  10 mg Oral QHS Wynetta Fines T, MD   10 mg at 01/26/23 2205   atorvastatin (LIPITOR) tablet 20 mg  20 mg Oral Daily Wynetta Fines T, MD   20 mg at 01/27/23 0800   azelastine (ASTELIN) 0.1 % nasal spray 2 spray  2 spray Each Nare BID Wynetta Fines T, MD   2 spray at 01/27/23 0802   capsaicin (ZOSTRIX) 0.025 % cream   Topical Daily Dwyane Dee, MD   Given at 01/27/23 0802   carvedilol (COREG) tablet 25 mg  25 mg Oral BID WC Wynetta Fines T, MD   25 mg at 01/27/23 0801   cetirizine (ZYRTEC) tablet 5 mg  5 mg Oral QPM Wynetta Fines T, MD   5 mg at 01/26/23 1748   cloNIDine (CATAPRES) tablet 0.1 mg  0.1 mg Oral TID Donne Hazel, MD   0.1 mg at 01/27/23 0801   Darbepoetin Alfa (ARANESP) injection 100 mcg  100 mcg Subcutaneous Q  [id number] Reesa Chew, MD   100 mcg at 01/25/23 1706   DULoxetine (CYMBALTA) DR capsule 30 mg  30 mg Oral Daily Wynetta Fines T, MD   30 mg at 01/27/23 0800   famotidine (PEPCID) tablet 20 mg  20 mg Oral QHS Wynetta Fines T, MD   20 mg at 01/26/23 2207   ferrous sulfate tablet 325 mg  325 mg Oral Q breakfast Wynetta Fines T, MD   325 mg at 01/27/23 0801   fluticasone (FLONASE) 50 MCG/ACT nasal spray 2 spray  2 spray Each Nare Daily Wynetta Fines T, MD   2  spray at 01/27/23 0803   gabapentin (NEURONTIN) capsule 100 mg  100 mg Oral QHS Donne Hazel, MD   100 mg at 01/26/23 2206   guaiFENesin (MUCINEX) 12 hr tablet 1,200 mg  1,200 mg Oral BID Wynetta Fines T, MD   1,200 mg at 01/27/23 0801   hydrALAZINE (APRESOLINE) tablet 100 mg  100 mg Oral TID Wynetta Fines T, MD   100 mg at 01/27/23 0801   insulin aspart (novoLOG) injection 0-9 Units  0-9 Units Subcutaneous TID WC Lequita Halt, MD   2 Units at 01/27/23 0732   insulin glargine-yfgn (SEMGLEE) injection 15 Units  15 Units Subcutaneous BID Donne Hazel, MD   15 Units at 01/27/23 0801   ipratropium-albuterol (DUONEB) 0.5-2.5 (3) MG/3ML nebulizer solution 3 mL  3 mL Nebulization Q6H PRN Donne Hazel, MD   3 mL at 01/26/23 2000   isosorbide mononitrate (IMDUR) 24 hr tablet 30 mg  30 mg Oral Daily Wynetta Fines T, MD   30 mg at 01/27/23 0801   labetalol (NORMODYNE) injection 10 mg  10 mg Intravenous Q4H PRN Wynetta Fines T, MD       lidocaine (LMX) 4 % cream   Topical Daily PRN Donne Hazel, MD       pantoprazole (PROTONIX) EC tablet 40 mg  40 mg Oral Daily Wynetta Fines T, MD   40 mg at 01/27/23 0800   promethazine (PHENERGAN) tablet 25 mg  25 mg Oral Q8H PRN Wynetta Fines T, MD   25 mg at 01/22/23 [id number]   sodium bicarbonate tablet 650 mg  650 mg Oral BID Wynetta Fines T, MD   650 mg at 01/27/23 0801   [START ON 01/28/2023] torsemide (DEMADEX) tablet 60 mg  60 mg Oral BID Reesa Chew, MD       traZODone (DESYREL) tablet 50 mg  50 mg Oral QHS Wynetta Fines T, MD   50 mg at 01/26/23 2207      Review of Systems: 10 systems reviewed and negative except per interval history/subjective  Physical Exam: Vitals:   01/27/23 0753 01/27/23 0759  BP:  136/77  Pulse: 82 83  Resp: 18 18  Temp:  98.1 F (36.7 C)  SpO2: 95% 93%   No intake/output data recorded.  Intake/Output Summary (Last 24 hours) at 01/27/2023 1052 Last data filed at 01/27/2023 0402 Gross per 24 hour  Intake 600 ml  Output 625 ml   Net -25 ml   Constitutional: well-appearing, no acute distress ENMT: ears and nose without scars or lesions, MMM CV: normal rate, trace edema in the lower extremities Respiratory: Bilateral chest rise, normal work of breathing, trach present Gastrointestinal: soft, non-tender, no palpable masses or hernias Skin: no visible lesions or rashes Psych: alert, judgement/insight appropriate, appropriate mood and affect   Test Results I personally reviewed new and old clinical labs  and radiology tests Lab Results  Component Value Date   NA 136 01/27/2023   K 3.5 01/27/2023   CL 99 01/27/2023   CO2 21 (L) 01/27/2023   BUN 106 (H) 01/27/2023   CREATININE 9.61 (H) 01/27/2023   GFR 6.98 (LL) 10/27/2022   CALCIUM 7.4 (L) 01/27/2023   ALBUMIN 2.5 (L) 01/27/2023   PHOS 8.7 (H) 01/27/2023    CBC Recent Labs  Lab 01/25/23 0151 01/26/23 0150 01/27/23 0300  WBC 14.0* 12.2* 10.6*  NEUTROABS  --  7.9* 6.6  HGB 7.9* 7.8* 7.7*  HCT 25.1* 24.2* 24.5*  MCV 90.9 91.3 91.1  PLT 329 313 291

## 2023-01-27 NOTE — Progress Notes (Signed)
Progress Note    Maureen Jones   [id number]  DOB: December 09, 1966  DOA: 01/18/2023     9 PCP: Flossie Buffy, NP  Initial CC: SOB, malaise   Hospital Course: Maureen Jones is a 57 y.o. 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   Pt presented with sob, streaky blood in sputum, reported fevers. Pt admitted for concerns of volume overload and CAP.  Interval History:  No events overnight.  Still ambulating well today.  Maureen Jones is able to remain on room air via trach throughout the day easily. No tremor/asterixis nor any SOB. Still does have LE edema.   Assessment and Plan:  AKI on CKD stage V -Presented originally with volume overload which has slowly been improving - Renal function still not stabilizing and slowly uptrending although does not meet criteria for warranting dialysis at this time.  Hope is to avoid need for dialysis given complex social aspects as noted -Appreciate Nephrology assistance. Trying to hold off dialysis as possible until pt can move to [place] and once there, pt to find a way to get HD there - s/p lasix -Now on torsemide per nephrology.  Metolazone discontinued - Renal function still not stable yet but no criteria for HD yet; persistent LE edema however  Acute on chronic hypoxic respiratory failure -With chronic tracheostomy; wears O2 at night over trach or when napping -Tolerates room air easily during the day -chest imaging reviewed. Patchy disease that can be both edema vs PNA -Suspect underlying CAP, given the worsening of productive cough and new fever -completed course of azithromycin and rocephin -Continued diuresis per Nephrology   Acute on chronic symptomatic anemia -Probably worsening of her baseline CKD related anemia -Pt s/p 1 unit PRBC this visit -Hgb  seems stable at this time -cont to follow cbc trends   Hemoptysis - resolved  -Likely related to pneumonia -No further episodes   Acute on chronic HFpEF -Continue diuresis per Nephrology   Chronic proteinuria -Appears to be in the nephrotic range -ACEI was discontinued November 2023 due to hyperkalemia.   IDDM with insulin resistance -On semglee 15 units bid -Cont SSI   HTN -Continue amlodipine and Coreg -Continue hydralazine 100 mg 3 times daily, add Imdur -on clonidine 0.4m tid   Morbid obesity -BMI=38 -Cut down calorie intake   Abnormal sensorum - resolved  -Recent events noted -CT head performed, neg -suspect related to renal disease   Diabetic neuropathy -Reported significant burning/tingling in B feet worse at night -giving trial of gabapentin [id number] -Have ordered trial of topical lidocaine as well   Hypomagnesemia -Replaced    Old records reviewed in assessment of this patient  DVT prophylaxis:  SCDs Start: 01/18/23 1612   Code Status:   Code Status: Full Code  Mobility Assessment (last 72 hours)     Mobility Assessment     Row Name 01/27/23 1000 01/27/23 0958 01/26/23 2300 01/26/23 0801 01/26/23 0800   Does patient have an order for bedrest or is patient medically unstable -- No - Continue assessment No - Continue assessment No - Continue assessment No - Continue assessment   What is the highest level of mobility based on the progressive mobility assessment? Level 5 (Walks with assist in room/hall) - Balance while stepping forward/back and can walk in room with assist - Complete Level 4 (  Walks with assist in room) - Balance while marching in place and cannot step forward and back - Complete Level 4 (Walks with assist in room) - Balance while marching in place and cannot step forward and back - Complete Level 5 (Walks with assist in room/hall) - Balance while stepping forward/back and can walk in room with assist - Complete Level 5 (Walks with assist  in room/hall) - Balance while stepping forward/back and can walk in room with assist - Complete    Row Name 01/25/23 2015 01/25/23 1300 01/25/23 1212 01/24/23 2015     Does patient have an order for bedrest or is patient medically unstable No - Continue assessment -- No - Continue assessment No - Continue assessment    What is the highest level of mobility based on the progressive mobility assessment? Level 5 (Walks with assist in room/hall) - Balance while stepping forward/back and can walk in room with assist - Complete Level 5 (Walks with assist in room/hall) - Balance while stepping forward/back and can walk in room with assist - Complete Level 5 (Walks with assist in room/hall) - Balance while stepping forward/back and can walk in room with assist - Complete Level 5 (Walks with assist in room/hall) - Balance while stepping forward/back and can walk in room with assist - Complete             Barriers to discharge:  Disposition Plan:  Home Status is: Inpt  Objective: Blood pressure 136/77, pulse 84, temperature 98.1 F (36.7 C), temperature source Oral, resp. rate 15, height 5' 6"$  (1.676 m), weight 100.1 kg, SpO2 94 %.  Examination:  Physical Exam Constitutional:      General: Maureen Jones is not in acute distress.    Appearance: Maureen Jones is well-developed.  HENT:     Head: Normocephalic and atraumatic.     Mouth/Throat:     Mouth: Mucous membranes are moist.  Eyes:     Extraocular Movements: Extraocular movements intact.  Neck:     Comments: Trach in place Cardiovascular:     Rate and Rhythm: Normal rate.     Heart sounds: Normal heart sounds.  Pulmonary:     Effort: Pulmonary effort is normal. No respiratory distress.     Breath sounds: Normal breath sounds. No wheezing.  Abdominal:     General: Bowel sounds are normal. There is no distension.     Palpations: Abdomen is soft.     Tenderness: There is no abdominal tenderness.  Musculoskeletal:        General: Normal range of motion.      Cervical back: Normal range of motion and neck supple.     Comments: B/L LE edema noted, approx 2+  Skin:    General: Skin is warm and dry.  Neurological:     General: No focal deficit present.     Mental Status: Maureen Jones is alert.  Psychiatric:        Mood and Affect: Mood normal.      Consultants:  Nephrology   Procedures:    Data Reviewed: Results for orders placed or performed during the hospital encounter of 01/18/23 (from the past 24 hour(s))  Glucose, capillary     Status: Abnormal   Collection Time: 01/26/23  5:08 PM  Result Value Ref Range   Glucose-Capillary 206 (H) 70 - 99 mg/dL  Glucose, capillary     Status: Abnormal   Collection Time: 01/26/23  9:08 PM  Result Value Ref Range   Glucose-Capillary 295 (H) 70 -  99 mg/dL  CBC with Differential/Platelet     Status: Abnormal   Collection Time: 01/27/23  3:00 AM  Result Value Ref Range   WBC 10.6 (H) 4.0 - 10.5 K/uL   RBC 2.69 (L) 3.87 - 5.11 MIL/uL   Hemoglobin 7.7 (L) 12.0 - 15.0 g/dL   HCT 24.5 (L) 36.0 - 46.0 %   MCV 91.1 80.0 - 100.0 fL   MCH 28.6 26.0 - 34.0 pg   MCHC 31.4 30.0 - 36.0 g/dL   RDW 13.5 11.5 - 15.5 %   Platelets 291 150 - 400 K/uL   nRBC 0.0 0.0 - 0.2 %   Neutrophils Relative % 61 %   Neutro Abs 6.6 1.7 - 7.7 K/uL   Lymphocytes Relative 23 %   Lymphs Abs 2.4 0.7 - 4.0 K/uL   Monocytes Relative 11 %   Monocytes Absolute 1.1 (H) 0.1 - 1.0 K/uL   Eosinophils Relative 3 %   Eosinophils Absolute 0.3 0.0 - 0.5 K/uL   Basophils Relative 1 %   Basophils Absolute 0.1 0.0 - 0.1 K/uL   Immature Granulocytes 1 %   Abs Immature Granulocytes 0.10 (H) 0.00 - 0.07 K/uL  Magnesium     Status: None   Collection Time: 01/27/23  3:00 AM  Result Value Ref Range   Magnesium 1.9 1.7 - 2.4 mg/dL  Renal function panel     Status: Abnormal   Collection Time: 01/27/23  3:00 AM  Result Value Ref Range   Sodium 136 135 - 145 mmol/L   Potassium 3.5 3.5 - 5.1 mmol/L   Chloride 99 98 - 111 mmol/L   CO2 21 (L)  22 - 32 mmol/L   Glucose, Bld 210 (H) 70 - 99 mg/dL   BUN 106 (H) 6 - 20 mg/dL   Creatinine, Ser 9.61 (H) 0.44 - 1.00 mg/dL   Calcium 7.4 (L) 8.9 - 10.3 mg/dL   Phosphorus 8.7 (H) 2.5 - 4.6 mg/dL   Albumin 2.5 (L) 3.5 - 5.0 g/dL   GFR, Estimated 4 (L) >60 mL/min   Anion gap 16 (H) 5 - 15  Glucose, capillary     Status: Abnormal   Collection Time: 01/27/23  7:19 AM  Result Value Ref Range   Glucose-Capillary 170 (H) 70 - 99 mg/dL  Glucose, capillary     Status: Abnormal   Collection Time: 01/27/23 11:28 AM  Result Value Ref Range   Glucose-Capillary 188 (H) 70 - 99 mg/dL    I have reviewed pertinent nursing notes, vitals, labs, and images as necessary. I have ordered labwork to follow up on as indicated.  I have reviewed the last notes from staff over past 24 hours. I have discussed patient's care plan and test results with nursing staff, CM/SW, and other staff as appropriate.  Time spent: Greater than 50% of the 55 minute visit was spent in counseling/coordination of care for the patient as laid out in the A&P.   LOS: 9 days   Dwyane Dee, MD [facility] 01/27/2023, 3:00 PM

## 2023-01-27 NOTE — Progress Notes (Signed)
Mobility Specialist Progress Note   01/27/23 1626  Mobility  Activity Ambulated with assistance in hallway  Level of Assistance Modified independent, requires aide device or extra time  Assistive Device Cane  Distance Ambulated (ft) 500 ft  Activity Response Tolerated well  Mobility Referral Yes  $Mobility charge 1 Mobility   Pt agreeable to mobility. X1 standing rest break d/t fatigue, recovered quickly and returned back to room w/o fault. Left sitting EOB w/ call bell in reach and needs met.   Holland Falling Mobility Specialist Please contact via SecureChat or  Rehab office at [phone]

## 2023-01-27 NOTE — Plan of Care (Signed)
  Problem: Education: Goal: Ability to describe self-care measures that may prevent or decrease complications (Diabetes Survival Skills Education) will improve Outcome: Progressing Goal: Individualized Educational Video(s) Outcome: Progressing   Problem: Coping: Goal: Ability to adjust to condition or change in health will improve Outcome: Progressing   Problem: Fluid Volume: Goal: Ability to maintain a balanced intake and output will improve Outcome: Progressing   Problem: Health Behavior/Discharge Planning: Goal: Ability to identify and utilize available resources and services will improve Outcome: Progressing Goal: Ability to manage health-related needs will improve Outcome: Progressing   Problem: Metabolic: Goal: Ability to maintain appropriate glucose levels will improve Outcome: Progressing   Problem: Nutritional: Goal: Maintenance of adequate nutrition will improve Outcome: Progressing Goal: Progress toward achieving an optimal weight will improve Outcome: Progressing   Problem: Skin Integrity: Goal: Risk for impaired skin integrity will decrease Outcome: Progressing   Problem: Tissue Perfusion: Goal: Adequacy of tissue perfusion will improve Outcome: Progressing   Problem: Activity: Goal: Ability to tolerate increased activity will improve Outcome: Progressing   Problem: Respiratory: Goal: Ability to maintain adequate ventilation will improve Outcome: Progressing Goal: Ability to maintain a clear airway will improve Outcome: Progressing   Problem: Clinical Measurements: Goal: Ability to maintain clinical measurements within normal limits will improve Outcome: Progressing Goal: Will remain free from infection Outcome: Progressing Goal: Respiratory complications will improve Outcome: Progressing Goal: Cardiovascular complication will be avoided Outcome: Progressing   Problem: Activity: Goal: Risk for activity intolerance will decrease Outcome:  Progressing   Problem: Nutrition: Goal: Adequate nutrition will be maintained Outcome: Progressing   Problem: Coping: Goal: Level of anxiety will decrease Outcome: Progressing   Problem: Elimination: Goal: Will not experience complications related to bowel motility Outcome: Progressing Goal: Will not experience complications related to urinary retention Outcome: Progressing   Problem: Pain Managment: Goal: General experience of comfort will improve Outcome: Progressing   Problem: Safety: Goal: Ability to remain free from injury will improve Outcome: Progressing   Problem: Skin Integrity: Goal: Risk for impaired skin integrity will decrease Outcome: Progressing   Problem: Education: Goal: Knowledge of disease and its progression will improve Outcome: Progressing   Problem: Health Behavior/Discharge Planning: Goal: Ability to manage health-related needs will improve Outcome: Progressing   Problem: Clinical Measurements: Goal: Complications related to the disease process or treatment will be avoided or minimized Outcome: Progressing Goal: Dialysis access will remain free of complications Outcome: Progressing   Problem: Activity: Goal: Activity intolerance will improve Outcome: Progressing   Problem: Fluid Volume: Goal: Fluid volume balance will be maintained or improved Outcome: Progressing   Problem: Nutritional: Goal: Ability to make appropriate dietary choices will improve Outcome: Progressing   Problem: Respiratory: Goal: Respiratory symptoms related to disease process will be avoided Outcome: Progressing   Problem: Self-Concept: Goal: Body image disturbance will be avoided or minimized Outcome: Progressing   Problem: Urinary Elimination: Goal: Progression of disease will be identified and treated Outcome: Progressing

## 2023-01-27 NOTE — Progress Notes (Signed)
Physical Therapy Treatment Patient Details Name: Maureen Jones MRN: [id number] DOB: 06/21/1966 Today's Date: 01/27/2023   History of Present Illness 57 y.o. female admitted 2/7 with malaise, shortness of breath, and coughing up blood. Acute on chronic hypoxic respiratory failure, Acute on chronic symptomatic anemia, AKI on CKD stage V,  and Acute on chronic HFpEF.  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    PT Comments    Progressing well. Improved stability with gait training today using SPC >165 feet. Challenged with dynamic balance tasks at a supervision level. SpO2 98% on RA throughout session. Patient will continue to benefit from skilled physical therapy services to further improve independence with functional mobility.    Recommendations for follow up therapy are one component of a multi-disciplinary discharge planning process, led by the attending physician.  Recommendations may be updated based on patient status, additional functional criteria and insurance authorization.  Follow Up Recommendations  Home health PT Can patient physically be transported by private vehicle: Yes   Assistance Recommended at Discharge Set up Supervision/Assistance  Patient can return home with the following A little help with walking and/or transfers;A little help with bathing/dressing/bathroom;Assistance with cooking/housework;Assist for transportation;Help with stairs or ramp for entrance   Equipment Recommendations  Cane    Recommendations for Other Services       Precautions / Restrictions Precautions Precautions: Fall Precaution Comments: Monitor O2, trach Restrictions Weight Bearing Restrictions: No     Mobility  Bed Mobility Overal bed mobility: Modified Independent             General bed mobility comments: No assist, extra time.    Transfers Overall transfer  level: Modified independent Equipment used: None, Straight cane Transfers: Sit to/from Stand Sit to Stand: Modified independent (Device/Increase time)           General transfer comment: Performed with and without AD, no assist needed.    Ambulation/Gait Ambulation/Gait assistance: Supervision Gait Distance (Feet): 165 Feet Assistive device: Straight cane Gait Pattern/deviations: Step-through pattern, Drifts right/left, Wide base of support, Staggering left Gait velocity: decr Gait velocity interpretation: <1.8 ft/sec, indicate of risk for recurrent falls   General Gait Details: Further gait training with SPC today showing improved stability from prior two sessions. I think the repetition and frequent OOB mobility is helping a lot. Practiced sharp turns c/w and cc/w with some instability but able to self correct. Cues for wider BOS when backing up.   Stairs             Wheelchair Mobility    Modified Rankin (Stroke Patients Only)       Balance Overall balance assessment: Needs assistance Sitting-balance support: No upper extremity supported, Feet supported Sitting balance-[name]: Good     Standing balance support: No upper extremity supported, During functional activity Standing balance-[name]: Fair                              Cognition Arousal/Alertness: Awake/alert Behavior During Therapy: WFL for tasks assessed/performed Overall Cognitive Status: Within Functional Limits for tasks assessed                                          Exercises Other Exercises Other Exercises: Dynamic balance activities performed in hallway:  using SPC for light support: Lateral steps 10' x 4, forward march 10' x2, backwards steps 10' x1. Supervision for safety, cues for technique.    General Comments General comments (skin integrity, edema, etc.): SpOw 98% on RA during session.      Pertinent Vitals/Pain Pain Assessment Pain  Assessment: No/denies pain    Home Living                          Prior Function            PT Goals (current goals can now be found in the care plan section) Acute Rehab PT Goals Patient Stated Goal: Get well, move to [place] at the end of the month PT Goal Formulation: With patient Time For Goal Achievement: 02/03/23 Potential to Achieve Goals: Good Progress towards PT goals: Progressing toward goals    Frequency    Min 3X/week      PT Plan Current plan remains appropriate    Co-evaluation              AM-PAC PT "6 Clicks" Mobility   Outcome Measure  Help needed turning from your back to your side while in a flat bed without using bedrails?: None Help needed moving from lying on your back to sitting on the side of a flat bed without using bedrails?: None Help needed moving to and from a bed to a chair (including a wheelchair)?: A Little Help needed standing up from a chair using your arms (e.g., wheelchair or bedside chair)?: A Little Help needed to walk in hospital room?: A Little Help needed climbing 3-5 steps with a railing? : A Lot 6 Click Score: 19    End of Session Equipment Utilized During Treatment: Gait belt Activity Tolerance: Patient tolerated treatment well Patient left: with call bell/phone within reach;in bed Nurse Communication: Mobility status PT Visit Diagnosis: Unsteadiness on feet (R26.81);Other abnormalities of gait and mobility (R26.89);Muscle weakness (generalized) (M62.81);Difficulty in walking, not elsewhere classified (R26.2)     Time: [id number] PT Time Calculation (min) (ACUTE ONLY): 25 min  Charges:  $Gait Training: 8-22 mins $Neuromuscular Re-education: 8-22 mins                     Maureen Jones, PT, DPT Physical Therapist Acute Rehabilitation Services [employer]    Maureen Jones 01/27/2023, 10:46  AM

## 2023-01-27 NOTE — Plan of Care (Signed)
Nutrition Education Note  RD consulted for Renal Education.   57 y.o. female admits related to SOB, feeling weak and coughing up blood. PMH includes: chronic hypoxic respiratory failure s/p trach, CKD stage V, IDDM. Pt is currently receiving medical management related to AKI on CKD stage V.   Provided Choose-A-Meal Booklet to patient/family. Reviewed food groups and provided written recommended serving sizes specifically determined for patient's current nutritional status.   Explained why diet restrictions are needed and provided lists of foods to limit/avoid that are high potassium, sodium, and phosphorus. Provided specific recommendations on safer alternatives of these foods. Strongly encouraged compliance of this diet.   Discussed importance of protein intake at each meal and snack. Provided examples of how to maximize protein intake throughout the day. Discussed need for fluid restriction with dialysis, importance of minimizing weight gain between HD treatments, and renal-friendly beverage options.  Encouraged pt to discuss specific diet questions/concerns with RD at HD outpatient facility. Teach back method used.  Expect good  compliance.  Body mass index is 35.62 kg/m. Pt meets criteria for morbid obesity based on current BMI.  Current diet order is Renal/CHO mod, 1800 mL , patient is consuming approximately 100% of meals at this time. Labs and medications reviewed. No further nutrition interventions warranted at this time. RD contact information provided. If additional nutrition issues arise, please re-consult RD.  Thalia Bloodgood, RD, LDN, CNSC

## 2023-01-27 NOTE — Plan of Care (Signed)
  Problem: Education: Goal: Ability to describe self-care measures that may prevent or decrease complications (Diabetes Survival Skills Education) will improve Outcome: Completed/Met   

## 2023-01-27 NOTE — Inpatient Diabetes Management (Signed)
Inpatient Diabetes Program Recommendations  AACE/ADA: New Consensus Statement on Inpatient Glycemic Control (2015)  Target Ranges:  Prepandial:   less than 140 mg/dL      Peak postprandial:   less than 180 mg/dL (1-2 hours)      Critically ill patients:  140 - 180 mg/dL   Lab Results  Component Value Date   GLUCAP 170 (H) 01/27/2023   HGBA1C 7.6 (H) 01/18/2023    Review of Glycemic Control  Latest Reference Range & Units 01/26/23 08:15 01/26/23 11:21 01/26/23 17:08 01/26/23 21:08 01/27/23 07:19  Glucose-Capillary 70 - 99 mg/dL 166 (H)  Novolog 2 units 131 (H)  Novolog 1 unit 206 (H)  Novolog 3 units 295 (H) 170 (H)  Novolog 2 units   Diabetes history: DM 2 Outpatient Diabetes medications: Tresiba 32 units bid Current orders for Inpatient glycemic control:  Semglee 15 units bid Novolog 0-9 units tid  Inpatient Diabetes Program Recommendations:    -  Add Novolog hs scale  Thanks,  Tama Headings RN, MSN, BC-ADM Inpatient Diabetes Coordinator Team Pager [phone] (8a-5p)

## 2023-01-28 DIAGNOSIS — N185 Chronic kidney disease, stage 5: Secondary | ICD-10-CM | POA: Diagnosis not present

## 2023-01-28 DIAGNOSIS — J189 Pneumonia, unspecified organism: Secondary | ICD-10-CM | POA: Diagnosis not present

## 2023-01-28 DIAGNOSIS — N179 Acute kidney failure, unspecified: Secondary | ICD-10-CM | POA: Diagnosis not present

## 2023-01-28 LAB — CBC WITH DIFFERENTIAL/PLATELET
Abs Immature Granulocytes: 0.13 10*3/uL — ABNORMAL HIGH (ref 0.00–0.07)
Basophils Absolute: 0 10*3/uL (ref 0.0–0.1)
Basophils Relative: 0 %
Eosinophils Absolute: 0.4 10*3/uL (ref 0.0–0.5)
Eosinophils Relative: 3 %
HCT: 24.6 % — ABNORMAL LOW (ref 36.0–46.0)
Hemoglobin: 8 g/dL — ABNORMAL LOW (ref 12.0–15.0)
Immature Granulocytes: 1 %
Lymphocytes Relative: 26 %
Lymphs Abs: 3.1 10*3/uL (ref 0.7–4.0)
MCH: 29 pg (ref 26.0–34.0)
MCHC: 32.5 g/dL (ref 30.0–36.0)
MCV: 89.1 fL (ref 80.0–100.0)
Monocytes Absolute: 1.4 10*3/uL — ABNORMAL HIGH (ref 0.1–1.0)
Monocytes Relative: 12 %
Neutro Abs: 6.9 10*3/uL (ref 1.7–7.7)
Neutrophils Relative %: 58 %
Platelets: 300 10*3/uL (ref 150–400)
RBC: 2.76 MIL/uL — ABNORMAL LOW (ref 3.87–5.11)
RDW: 13.7 % (ref 11.5–15.5)
WBC: 11.9 10*3/uL — ABNORMAL HIGH (ref 4.0–10.5)
nRBC: 0.3 % — ABNORMAL HIGH (ref 0.0–0.2)

## 2023-01-28 LAB — RENAL FUNCTION PANEL
Albumin: 2.6 g/dL — ABNORMAL LOW (ref 3.5–5.0)
Anion gap: 15 (ref 5–15)
BUN: 112 mg/dL — ABNORMAL HIGH (ref 6–20)
CO2: 22 mmol/L (ref 22–32)
Calcium: 7.4 mg/dL — ABNORMAL LOW (ref 8.9–10.3)
Chloride: 98 mmol/L (ref 98–111)
Creatinine, Ser: 9.74 mg/dL — ABNORMAL HIGH (ref 0.44–1.00)
GFR, Estimated: 4 mL/min — ABNORMAL LOW (ref >60)
Glucose, Bld: 186 mg/dL — ABNORMAL HIGH (ref 70–99)
Phosphorus: 9 mg/dL — ABNORMAL HIGH (ref 2.5–4.6)
Potassium: 3.7 mmol/L (ref 3.5–5.1)
Sodium: 135 mmol/L (ref 135–145)

## 2023-01-28 LAB — GLUCOSE, CAPILLARY
Glucose-Capillary: 149 mg/dL — ABNORMAL HIGH (ref 70–99)
Glucose-Capillary: 192 mg/dL — ABNORMAL HIGH (ref 70–99)

## 2023-01-28 LAB — MAGNESIUM: Magnesium: 1.9 mg/dL (ref 1.7–2.4)

## 2023-01-28 MED ORDER — FERROUS SULFATE 325 (65 FE) MG PO TABS: 325.0000 mg | ORAL_TABLET | Freq: Every day | ORAL | 3 refills | Status: AC

## 2023-01-28 MED ORDER — SEVELAMER CARBONATE 800 MG PO TABS
800.0000 mg | ORAL_TABLET | Freq: Three times a day (TID) | ORAL | Status: DC
  Administered 2023-01-28 (×2): 800 mg via ORAL
  Filled 2023-01-28 (×2): qty 1

## 2023-01-28 MED ORDER — SEVELAMER CARBONATE 800 MG PO TABS: 800.0000 mg | ORAL_TABLET | Freq: Three times a day (TID) | ORAL | 3 refills | Status: AC

## 2023-01-28 MED ORDER — TORSEMIDE 60 MG PO TABS: 60.0000 mg | ORAL_TABLET | Freq: Two times a day (BID) | ORAL | 3 refills | Status: DC

## 2023-01-28 MED ORDER — ISOSORBIDE MONONITRATE ER 30 MG PO TB24: 30.0000 mg | ORAL_TABLET | Freq: Every day | ORAL | 3 refills | Status: AC

## 2023-01-28 NOTE — Progress Notes (Signed)
Nephrology Follow-Up Consult note   Assessment/Recommendations: Maureen Jones is a/an 57 y.o. female with a past medical history significant for chronic hypoxic respiratory failure, CKD 5, DM2, morbid obesity, admitted for acute on chronic hypoxic respiratory failure.       Nonoliguric AKI on CKD 5: Baseline creatinine around 6.8-7.5 followed by Dr. Hollie Salk in the outpatient setting.  Creatinine has worsened during this time with diuresis but no obvious uremic symptoms.  There are a lot of barriers for this patient's care regarding nearing ESRD.  The patient cannot easily undergo hemodialysis in center because of her trach.  There has been tentative plan for home dialysis with Dr. Hollie Salk.  However the patient is planning to move to [place] in a couple weeks and so this plan would not be feasible.  Ultimately we will do our best to keep the patient off dialysis until she can moved to [place].  Once there she can find a way to get dialysis.  This is not an optimal plan but our options are limited.  Volume status has improved -Restarting torsemide 60 mg twice daily -Would need the torsemide at discharge -No uremic symptoms -Continue to monitor daily Cr, Dose meds for GFR -Monitor Daily I/Os, Daily weight  -Maintain MAP>65 for optimal renal perfusion.  -Avoid nephrotoxic medications including NSAIDs -Use synthetic opioids (Fentanyl/Dilaudid) if needed -Monitor closely for hemodialysis needs -Hold on access creation so as not to complicate potential discharge -Arranging for BMP early next week to monitor kidney function before she moves to [place] on 2/25  Acute on chronic hypoxic respiratory failure: Likely multifactorial with some volume overload as well as CAP.  Antibiotics per primary.  Diuretics as above.  Respiratory status now back to baseline  Anemia of CKD: Likely multifactorial but hemoglobin significantly low.  Recently received transfusion.  ESA also added on  Metabolic  acidosis: Mild.  Continue to monitor  Uncontrolled Diabetes Mellitus Type 2 with Hyperglycemia: Management per primary team   Recommendations conveyed to primary service.    [facility] 01/28/2023 9:09 AM  ___________________________________________________________  CC: Shortness of breath  Interval History/Subjective: Patient states she feels a lot better today.  Less jittering and breathing is doing really good.  Feels like it is at her baseline.  Creatinine essentially the same today.  BUN slightly higher.  We had a long discussion regarding her care.  Ongoing efforts to help her move.  She feels like she is stable to go home   Medications:  Current Facility-Administered Medications  Medication Dose Route Frequency Provider Last Rate Last Admin   0.9 %  sodium chloride infusion   Intravenous PRN Lequita Halt, MD 10 mL/hr at 01/25/23 0651 New Bag at 01/25/23 0651   amLODipine (NORVASC) tablet 10 mg  10 mg Oral QHS Wynetta Fines T, MD   10 mg at 01/27/23 2121   atorvastatin (LIPITOR) tablet 20 mg  20 mg Oral Daily Wynetta Fines T, MD   20 mg at 01/27/23 0800   azelastine (ASTELIN) 0.1 % nasal spray 2 spray  2 spray Each Nare BID Wynetta Fines T, MD   2 spray at 01/27/23 0802   capsaicin (ZOSTRIX) 0.025 % cream   Topical Daily Dwyane Dee, MD   Given at 01/27/23 0802   carvedilol (COREG) tablet 25 mg  25 mg Oral BID WC Wynetta Fines T, MD   25 mg at 01/27/23 1603   cetirizine (ZYRTEC) tablet 5 mg  5 mg Oral QPM Wynetta Fines  T, MD   5 mg at 01/27/23 2122   cloNIDine (CATAPRES) tablet 0.1 mg  0.1 mg Oral TID Donne Hazel, MD   0.1 mg at 01/27/23 2120   Darbepoetin Alfa (ARANESP) injection 100 mcg  100 mcg Subcutaneous Q Wed-1800 Reesa Chew, MD   100 mcg at 01/25/23 1706   DULoxetine (CYMBALTA) DR capsule 30 mg  30 mg Oral Daily Wynetta Fines T, MD   30 mg at 01/27/23 0800   famotidine (PEPCID) tablet 20 mg  20 mg Oral QHS Wynetta Fines T, MD   20 mg at  01/27/23 2120   ferrous sulfate tablet 325 mg  325 mg Oral Q breakfast Wynetta Fines T, MD   325 mg at 01/27/23 0801   fluticasone (FLONASE) 50 MCG/ACT nasal spray 2 spray  2 spray Each Nare Daily Wynetta Fines T, MD   2 spray at 01/27/23 0803   gabapentin (NEURONTIN) capsule 100 mg  100 mg Oral QHS Donne Hazel, MD   100 mg at 01/27/23 2120   guaiFENesin (MUCINEX) 12 hr tablet 1,200 mg  1,200 mg Oral BID Wynetta Fines T, MD   1,200 mg at 01/27/23 2120   hydrALAZINE (APRESOLINE) tablet 100 mg  100 mg Oral TID Wynetta Fines T, MD   100 mg at 01/27/23 2121   insulin aspart (novoLOG) injection 0-9 Units  0-9 Units Subcutaneous TID AC & HS Dwyane Dee, MD   1 Units at 01/28/23 0736   insulin glargine-yfgn (SEMGLEE) injection 15 Units  15 Units Subcutaneous BID Donne Hazel, MD   15 Units at 01/27/23 2121   ipratropium-albuterol (DUONEB) 0.5-2.5 (3) MG/3ML nebulizer solution 3 mL  3 mL Nebulization Q6H PRN Donne Hazel, MD   3 mL at 01/26/23 2000   isosorbide mononitrate (IMDUR) 24 hr tablet 30 mg  30 mg Oral Daily Wynetta Fines T, MD   30 mg at 01/27/23 0801   labetalol (NORMODYNE) injection 10 mg  10 mg Intravenous Q4H PRN Wynetta Fines T, MD       lidocaine (LMX) 4 % cream   Topical Daily PRN Donne Hazel, MD       pantoprazole (PROTONIX) EC tablet 40 mg  40 mg Oral Daily Wynetta Fines T, MD   40 mg at 01/27/23 0800   promethazine (PHENERGAN) tablet 25 mg  25 mg Oral Q8H PRN Wynetta Fines T, MD   25 mg at 01/22/23 [id number]   sevelamer carbonate (RENVELA) tablet 800 mg  800 mg Oral TID WC Reesa Chew, MD       sodium bicarbonate tablet 650 mg  650 mg Oral BID Wynetta Fines T, MD   650 mg at 01/27/23 2120   torsemide (DEMADEX) tablet 60 mg  60 mg Oral BID Reesa Chew, MD       traZODone (DESYREL) tablet 50 mg  50 mg Oral QHS Wynetta Fines T, MD   50 mg at 01/27/23 2120      Review of Systems: 10 systems reviewed and negative except per interval history/subjective  Physical Exam: Vitals:    01/28/23 0733 01/28/23 0816  BP: (!) 149/73   Pulse: 85 80  Resp: 18 19  Temp: 98.2 F (36.8 C)   SpO2: 97% 98%   No intake/output data recorded.  Intake/Output Summary (Last 24 hours) at 01/28/2023 0909 Last data filed at 01/28/2023 0600 Gross per 24 hour  Intake 360 ml  Output 200 ml  Net 160 ml   Constitutional:  well-appearing, no acute distress ENMT: ears and nose without scars or lesions, MMM CV: normal rate, trace edema in the lower extremities Respiratory: Bilateral chest rise, normal work of breathing, trach present Gastrointestinal: soft, non-tender, no palpable masses or hernias Skin: no visible lesions or rashes Psych: alert, judgement/insight appropriate, appropriate mood and affect   Test Results I personally reviewed new and old clinical labs and radiology tests Lab Results  Component Value Date   NA 135 01/28/2023   K 3.7 01/28/2023   CL 98 01/28/2023   CO2 22 01/28/2023   BUN 112 (H) 01/28/2023   CREATININE 9.74 (H) 01/28/2023   GFR 6.98 (LL) 10/27/2022   CALCIUM 7.4 (L) 01/28/2023   ALBUMIN 2.6 (L) 01/28/2023   PHOS 9.0 (H) 01/28/2023    CBC Recent Labs  Lab 01/26/23 0150 01/27/23 0300 01/28/23 0301  WBC 12.2* 10.6* 11.9*  NEUTROABS 7.9* 6.6 6.9  HGB 7.8* 7.7* 8.0*  HCT 24.2* 24.5* 24.6*  MCV 91.3 91.1 89.1  PLT 313 291 300

## 2023-01-28 NOTE — Plan of Care (Signed)
  Problem: Education: Goal: Individualized Educational Video(s) Outcome: Progressing   Problem: Coping: Goal: Ability to adjust to condition or change in health will improve Outcome: Progressing   Problem: Fluid Volume: Goal: Ability to maintain a balanced intake and output will improve Outcome: Progressing   Problem: Health Behavior/Discharge Planning: Goal: Ability to identify and utilize available resources and services will improve Outcome: Progressing Goal: Ability to manage health-related needs will improve Outcome: Progressing   Problem: Metabolic: Goal: Ability to maintain appropriate glucose levels will improve Outcome: Progressing   Problem: Nutritional: Goal: Maintenance of adequate nutrition will improve Outcome: Progressing Goal: Progress toward achieving an optimal weight will improve Outcome: Progressing   Problem: Skin Integrity: Goal: Risk for impaired skin integrity will decrease Outcome: Progressing   Problem: Tissue Perfusion: Goal: Adequacy of tissue perfusion will improve Outcome: Progressing   Problem: Activity: Goal: Ability to tolerate increased activity will improve Outcome: Progressing   Problem: Respiratory: Goal: Ability to maintain adequate ventilation will improve Outcome: Progressing Goal: Ability to maintain a clear airway will improve Outcome: Progressing   Problem: Clinical Measurements: Goal: Ability to maintain clinical measurements within normal limits will improve Outcome: Progressing Goal: Will remain free from infection Outcome: Progressing Goal: Respiratory complications will improve Outcome: Progressing Goal: Cardiovascular complication will be avoided Outcome: Progressing   Problem: Activity: Goal: Risk for activity intolerance will decrease Outcome: Progressing   Problem: Nutrition: Goal: Adequate nutrition will be maintained Outcome: Progressing   Problem: Coping: Goal: Level of anxiety will  decrease Outcome: Progressing   Problem: Elimination: Goal: Will not experience complications related to bowel motility Outcome: Progressing Goal: Will not experience complications related to urinary retention Outcome: Progressing   Problem: Pain Managment: Goal: General experience of comfort will improve Outcome: Progressing   Problem: Safety: Goal: Ability to remain free from injury will improve Outcome: Progressing   Problem: Skin Integrity: Goal: Risk for impaired skin integrity will decrease Outcome: Progressing   Problem: Education: Goal: Knowledge of disease and its progression will improve Outcome: Progressing   Problem: Health Behavior/Discharge Planning: Goal: Ability to manage health-related needs will improve Outcome: Progressing   Problem: Clinical Measurements: Goal: Complications related to the disease process or treatment will be avoided or minimized Outcome: Progressing Goal: Dialysis access will remain free of complications Outcome: Progressing   Problem: Activity: Goal: Activity intolerance will improve Outcome: Progressing   Problem: Fluid Volume: Goal: Fluid volume balance will be maintained or improved Outcome: Progressing   Problem: Nutritional: Goal: Ability to make appropriate dietary choices will improve Outcome: Progressing   Problem: Respiratory: Goal: Respiratory symptoms related to disease process will be avoided Outcome: Progressing   Problem: Self-Concept: Goal: Body image disturbance will be avoided or minimized Outcome: Progressing   Problem: Urinary Elimination: Goal: Progression of disease will be identified and treated Outcome: Progressing

## 2023-01-28 NOTE — Progress Notes (Signed)
DISCHARGE NOTE HOME Maureen Jones to be discharged Home per MD order. Discussed prescriptions and follow up appointments with the patient. Prescriptions given to patient; medication list explained in detail. Patient verbalized understanding.  Skin clean, dry and intact without evidence of skin break down, no evidence of skin tears noted. IV catheter discontinued intact. Site without signs and symptoms of complications. Dressing and pressure applied. Pt denies pain at the site currently. No complaints noted.  Patient free of lines, drains, and wounds.   An After Visit Summary (AVS) was printed and given to the patient. Patient transported home via [place], RN

## 2023-01-28 NOTE — TOC Transition Note (Signed)
Transition of Care [facility]) - CM/SW Discharge Note   Patient Details  Name: Maureen Jones MRN: [id number] Date of Birth: September 24, 1966  [facility]) CM/SW Contact:  [name], [name], RN Phone Number: 01/28/2023, 10:41 AM   Clinical Narrative:     Patient is scheduled for discharge today. Home health info on AVS. Request & Authorization for use /Disclosure of Protected Health Information Form given to patient at bedside per her request to fill out and send to Medical Records. PTAR scheduled for Transportation. No further TOC needs noted.             Final next level of care: Home w Home Health Services Barriers to Discharge: Barriers Resolved   Patient Goals and CMS Choice CMS Medicare.gov Compare Post Acute Care list provided to:: Patient Choice offered to / list presented to : Patient  Discharge Placement                  Patient to be transferred to facility by: [facility]      Discharge Plan and Services Additional resources added to the After Visit Summary for   In-house Referral: Clinical Social Work Discharge Planning Services: CM Consult            DME Arranged: Kasandra Knudsen DME Agency: [employer] Date DME Agency Contacted: 01/20/23 Time DME Agency Contacted: 1620 Representative spoke with at DME Agency: Erasmo Downer HH Arranged: PT [place]: [place] Date [place]: 01/24/23 Time [place]: 1107 Representative spoke with at [place]: [place] ([place]) Interventions SDOH Screenings   Transportation Needs: No Transportation Needs (12/21/2022)  Depression (PHQ2-9): Medium Risk (01/02/2023)  Physical Activity: Inactive (12/20/2022)  Stress: Stress Concern Present (01/02/2023)  Tobacco Use: Medium Risk (11/23/2022)     Readmission Risk Interventions    12/13/2021   12:35 PM 11/15/2021   10:13 AM  Readmission Risk Prevention Plan  Transportation Screening Complete Complete  PCP or  Specialist Appt within 3-5 Days Complete Complete  HRI or Home Care Consult Complete Complete  Social Work Consult for [place] Planning/Counseling Complete Patient refused  Palliative Care Screening Not Applicable Not Applicable  Medication Review Press photographer) Complete Complete

## 2023-01-28 NOTE — Discharge Summary (Signed)
Physician Discharge Summary   Maureen Jones [id number] DOB: 04-Apr-1966 DOA: 01/18/2023  PCP: Flossie Buffy, NP  Admit date: 01/18/2023 Discharge date: 01/28/2023   Admitted From: Home Disposition:  Home Discharging physician: Dwyane Dee, [place] Barriers to discharge:   Recommendations for Outpatient Follow-up:  Follow up with nephrology; outpatient labs Patient planning to re-locate to [place] by the 25th if possible; needs to ensure she establishes care once moved back to [place]  Discharge Condition: stable CODE STATUS: Full Diet recommendation:  Diet Orders (From admission, onward)     Start     Ordered   01/18/23 1613  Diet renal/carb modified with fluid restriction Diet-HS Snack? Nothing; Fluid restriction: 1800 mL Fluid; Room service appropriate? Yes; Fluid consistency: Thin  Diet effective now       Question Answer Comment  Diet-HS Snack? Nothing   Fluid restriction: 1800 mL Fluid   Room service appropriate? Yes   Fluid consistency: Thin      01/18/23 1613            Hospital Course: Maureen Jones is a 57 y.o. 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   Pt presented with sob, streaky blood in sputum, reported fevers. Pt admitted for concerns of volume overload and CAP.  Assessment and Plan:   AKI on CKD stage V -Presented originally with volume overload which has slowly been improving - Renal function still not stabilizing and slowly uptrending although does not meet criteria for warranting dialysis at this time.  Hope is to avoid need for dialysis given complex social aspects as noted -Appreciate Nephrology assistance. Trying to hold off dialysis as possible until pt can move to [place] and once there, pt to find a way to get HD there - s/p lasix -Now on torsemide  per nephrology.  Metolazone discontinued - Renal function still not stable yet but no criteria for HD yet; persistent LE edema however - given that she's asymptomatic despite creat changes, it was discussed with nephrology that she was stable enough for discharging home with close outpatient follow-up for repeat lab work; patient was also amenable with this plan   Acute on chronic hypoxic respiratory failure -With chronic tracheostomy; wears O2 at night over trach or when napping -Tolerates room air easily during the day -chest imaging reviewed. Patchy disease that can be both edema vs PNA -Suspect underlying CAP, given the worsening of productive cough and new fever -completed course of azithromycin and rocephin   Acute on chronic symptomatic anemia -Probably worsening of her baseline CKD related anemia -Pt s/p 1 unit PRBC this visit -Hemoglobin remaining stable at discharge; 8 g/dL   Hemoptysis - resolved  -Likely related to pneumonia -No further episodes   Acute on chronic HFpEF -Continue diuresis   Chronic proteinuria -Appears to be in the nephrotic range -ACEI was discontinued November 2023 due to hyperkalemia.   IDDM with insulin resistance -Home regimen resumed   HTN -Continue amlodipine and Coreg -Continue hydralazine 100 mg 3 times daily, add Imdur -on clonidine 0.40m tid   Morbid obesity -BMI=38 -Cut down calorie intake   Abnormal sensorum - resolved  -Recent events noted -CT head performed, neg -suspect related to renal disease   Diabetic neuropathy -Reported significant burning/tingling in B feet worse at night - trial of gaba inpatient, but in context of labile renal  function, decided not to prescribe at discharge given concerns about potential toxicity   Hypomagnesemia -Replaced    The patient's chronic medical conditions were treated accordingly per the patient's home medication regimen except as noted.  On day of discharge, patient was felt deemed  stable for discharge. Patient/family member advised to call PCP or come back to ER if needed.   Principal Diagnosis: Acute renal failure superimposed on stage 5 chronic kidney disease, not on chronic dialysis [facility])  Discharge Diagnoses: Active Hospital Problems   Diagnosis Date Noted   Acute renal failure superimposed on stage 5 chronic kidney disease, not on chronic dialysis ([place]) 10/05/2022    Priority: 1.   Normocytic anemia 01/25/2023   Hypomagnesemia 01/25/2023   Diabetic neuropathy ([place]) 01/25/2023   Vocal cord dysfunction    Endotracheal tube present    DM (diabetes mellitus) ([place]) 11/15/2019   OSA (obstructive sleep apnea) 11/15/2019   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    Resolved Hospital Problems   Diagnosis Date Noted Date Resolved   CAP (community acquired pneumonia) 01/18/2023 01/25/2023    Priority: 1.     Discharge Instructions     Increase activity slowly   Complete by: As directed       Allergies as of 01/28/2023   No Known Allergies      Medication List     STOP taking these medications    Excedrin Migraine 250-250-65 MG tablet Generic drug: aspirin-acetaminophen-caffeine   SPS 15 GM/60ML suspension Generic drug: sodium polystyrene       TAKE these medications    amLODipine 10 MG tablet Commonly known as: NORVASC Take 1 tablet (10 mg total) by mouth at bedtime.   atorvastatin 20 MG tablet Commonly known as: LIPITOR Take 1 tablet (20 mg total) by mouth daily.   azelastine 0.1 % nasal spray Commonly known as: ASTELIN Place 2 sprays into both nostrils 2 (two) times daily.   carvedilol 25 MG tablet Commonly known as: COREG Take 1 tablet (25 mg total) by mouth 2 (two) times daily.   cloNIDine 0.3 MG tablet Commonly known as: CATAPRES Take 1 tablet (0.3 mg total) by mouth 3 (three) times daily.   DULoxetine 30 MG capsule Commonly known as: CYMBALTA Take 1 capsule (30 mg total) by mouth daily.   famotidine 20 MG tablet Commonly  known as: PEPCID Take 1 tablet (20 mg total) by mouth at bedtime.   ferrous sulfate 325 (65 FE) MG tablet Take 1 tablet (325 mg total) by mouth daily with breakfast. Start taking on: January 29, 2023   fluticasone 50 MCG/ACT nasal spray Commonly known as: FLONASE Place 2 sprays into both nostrils daily. What changed:  how much to take when to take this   FreeStyle Libre 14 Day Sensor Misc 1 Units by Does not apply route every 14 (fourteen) days.   Humidifier Misc 1 Device as directed.   hydrALAZINE 100 MG tablet Commonly known as: APRESOLINE Take 1 tablet (100 mg total) by mouth 3 (three) times daily.   Insulin Pen Needle 32G X 4 MM Misc Use to inject insulin up to 4 times daily as directed   isosorbide mononitrate 30 MG 24 hr tablet Commonly known as: IMDUR Take 1 tablet (30 mg total) by mouth daily. Start taking on: January 29, 2023   levocetirizine 5 MG tablet Commonly known as: XYZAL Take 1 tablet (5 mg total) by mouth every evening.   OneTouch Verio test strip Generic drug: glucose blood Use as instructed   OXYGEN  Inhale 4 L/min into the lungs at bedtime.   pantoprazole 40 MG tablet Commonly known as: Protonix Take 1 tablet (40 mg total) by mouth daily. What changed: when to take this   promethazine 25 MG tablet Commonly known as: PHENERGAN Take 1 tablet (25 mg total) by mouth every 8 (eight) hours as needed for nausea or vomiting.   sevelamer carbonate 800 MG tablet Commonly known as: RENVELA Take 1 tablet (800 mg total) by mouth 3 (three) times daily with meals.   sodium bicarbonate 650 MG tablet Take 1 tablet (650 mg total) by mouth 2 (two) times daily.   Torsemide 60 MG Tabs Take 60 mg by mouth 2 (two) times daily. What changed:  medication strength how much to take when to take this   traZODone 50 MG tablet Commonly known as: DESYREL Take 1 tablet (50 mg total) by mouth at bedtime.   Tyler Aas FlexTouch 200 UNIT/ML FlexTouch Pen Generic  drug: insulin degludec Inject 18 Units into the skin 2 (two) times daily. What changed: how much to take   VITAMIN B12 PO Take 1 tablet by mouth daily.   VITAMIN E PO Take 1 capsule by mouth daily.               Durable Medical Equipment  (From admission, onward)           Start     Ordered   01/20/23 1622  For home use only DME Cane  Once        01/20/23 1621   01/20/23 1414  For home use only DME Cane  Once        01/20/23 1413            Follow-up Information     [facility] [place] Follow up.   Specialty: Home Health Services Why: Someone will call you to schedule first home visit. If you have not received a call after two days of discharging home, call their number listed. If no one comes to assess, call Case Manager at [phone]. Contact information: [address] [place] [address] [phone]                No Known Allergies  Consultations: Nephrology  Procedures:   Discharge Exam: BP (!) 149/73   Pulse 80   Temp 98.2 F (36.8 C) (Oral)   Resp 19   Ht 5' 6"$  (1.676 m)   Wt 100.1 kg   SpO2 98%   BMI 35.62 kg/m  Physical Exam Constitutional:      General: She is not in acute distress.    Appearance: She is well-developed.  HENT:     Head: Normocephalic and atraumatic.     Mouth/Throat:     Mouth: Mucous membranes are moist.  Eyes:     Extraocular Movements: Extraocular movements intact.  Neck:     Comments: Trach in place Cardiovascular:     Rate and Rhythm: Normal rate.     Heart sounds: Normal heart sounds.  Pulmonary:     Effort: Pulmonary effort is normal. No respiratory distress.     Breath sounds: Normal breath sounds. No wheezing.  Abdominal:     General: Bowel sounds are normal. There is no distension.     Palpations: Abdomen is soft.     Tenderness: There is no abdominal tenderness.  Musculoskeletal:        General: Normal range of motion.     Cervical back: Normal range of motion and  neck supple.     Comments: B/L LE edema noted, approx 2+  Skin:    General: Skin is warm and dry.  Neurological:     General: No focal deficit present.     Mental Status: She is alert.  Psychiatric:        Mood and Affect: Mood normal.      The results of significant diagnostics from this hospitalization (including imaging, microbiology, ancillary and laboratory) are listed below for reference.   Microbiology: No results found for this or any previous visit (from the past 240 hour(s)).   Labs: BNP (last 3 results) Recent Labs    02/14/22 0537 05/22/22 2032 12/12/22 0135  BNP 269.0* 63.9 0000000*   Basic Metabolic Panel: Recent Labs  Lab 01/22/23 0846 01/23/23 0439 01/24/23 0233 01/25/23 0151 01/26/23 0150 01/27/23 0300 01/28/23 0301  NA 139   < > 138 137 138 136 135  K 3.4*   < > 3.6 3.8 3.7 3.5 3.7  CL 105   < > 104 102 100 99 98  CO2 19*   < > 19* 19* 19* 21* 22  GLUCOSE 165*   < > 140* 142* 152* 210* 186*  BUN 77*   < > 86* 91* 95* 106* 112*  CREATININE 8.10*   < > 8.65* 8.80* 9.00* 9.61* 9.74*  CALCIUM 8.0*   < > 7.7* 7.8* 7.5* 7.4* 7.4*  MG 2.1  --   --  2.0 1.9 1.9 1.9  PHOS  --   --   --   --  8.5* 8.7* 9.0*   < > = values in this interval not displayed.   Liver Function Tests: Recent Labs  Lab 01/22/23 0846 01/23/23 0439 01/24/23 0233 01/25/23 0151 01/26/23 0150 01/27/23 0300 01/28/23 0301  AST 13* 14* 16 17  --   --   --   ALT 11 11 12 13  $ --   --   --   ALKPHOS 117 105 109 123  --   --   --   BILITOT 0.9 0.6 0.5 0.7  --   --   --   PROT 6.4* 6.0* 6.0* 6.8  --   --   --   ALBUMIN 2.2* 2.3* 2.3* 2.6* 2.5* 2.5* 2.6*   No results for input(s): "LIPASE", "AMYLASE" in the last 168 hours. No results for input(s): "AMMONIA" in the last 168 hours. CBC: Recent Labs  Lab 01/24/23 0233 01/25/23 0151 01/26/23 0150 01/27/23 0300 01/28/23 0301  WBC 12.8* 14.0* 12.2* 10.6* 11.9*  NEUTROABS  --   --  7.9* 6.6 6.9  HGB 7.2* 7.9* 7.8* 7.7* 8.0*  HCT  22.1* 25.1* 24.2* 24.5* 24.6*  MCV 90.9 90.9 91.3 91.1 89.1  PLT 284 329 313 291 300   Cardiac Enzymes: No results for input(s): "CKTOTAL", "CKMB", "CKMBINDEX", "TROPONINI" in the last 168 hours. BNP: Invalid input(s): "POCBNP" CBG: Recent Labs  Lab 01/27/23 1128 01/27/23 1600 01/27/23 2059 01/28/23 0722 01/28/23 1127  GLUCAP 188* 191* 230* 149* 192*   D-Dimer No results for input(s): "DDIMER" in the last 72 hours. Hgb A1c No results for input(s): "HGBA1C" in the last 72 hours. Lipid Profile No results for input(s): "CHOL", "HDL", "LDLCALC", "TRIG", "CHOLHDL", "LDLDIRECT" in the last 72 hours. Thyroid function studies No results for input(s): "TSH", "T4TOTAL", "T3FREE", "THYROIDAB" in the last 72 hours.  Invalid input(s): "FREET3" Anemia work up No results for input(s): "VITAMINB12", "FOLATE", "FERRITIN", "TIBC", "IRON", "RETICCTPCT" in the last 72 hours. Urinalysis  Component Value Date/Time   COLORURINE STRAW (A) 01/22/2023 1823   APPEARANCEUR CLEAR 01/22/2023 1823   LABSPEC 1.012 01/22/2023 1823   PHURINE 5.0 01/22/2023 1823   GLUCOSEU 150 (A) 01/22/2023 1823   HGBUR NEGATIVE 01/22/2023 1823   BILIRUBINUR NEGATIVE 01/22/2023 1823   KETONESUR NEGATIVE 01/22/2023 1823   PROTEINUR >=300 (A) 01/22/2023 1823   NITRITE NEGATIVE 01/22/2023 1823   LEUKOCYTESUR NEGATIVE 01/22/2023 1823   Sepsis Labs Recent Labs  Lab 01/25/23 0151 01/26/23 0150 01/27/23 0300 01/28/23 0301  WBC 14.0* 12.2* 10.6* 11.9*   Microbiology No results found for this or any previous visit (from the past 240 hour(s)).  Procedures/Studies: US RENAL  Result Date: 01/22/2023 CLINICAL DATA:  Chronic kidney disease EXAM: RENAL / URINARY TRACT ULTRASOUND COMPLETE COMPARISON:  CT abdomen and pelvis 10/05/2022 FINDINGS: Right Kidney: Renal measurements: 11.3 x 5.8 x 6.7 cm = volume: 228 mL. Echogenicity is increased. No mass or hydronephrosis visualized. Left Kidney: Renal measurements: 10.3 x 6.7  x 5.3 cm = volume: 190 mL. Echogenicity is increased. No mass or hydronephrosis visualized. Bladder: Appears normal for degree of bladder distention. Other: None. IMPRESSION: Increased echogenicity of the bilateral kidneys as can be seen in medical renal disease. Electronically Signed   By: Ronney Asters M.D.   On: 01/22/2023 19:18   CT HEAD WO CONTRAST (5MM)  Result Date: 01/20/2023 CLINICAL DATA:  Transient ischemic attack. EXAM: CT HEAD WITHOUT CONTRAST TECHNIQUE: Contiguous axial images were obtained from the base of the skull through the vertex without intravenous contrast. RADIATION DOSE REDUCTION: This exam was performed according to the departmental dose-optimization program which includes automated exposure control, adjustment of the mA and/or kV according to patient size and/or use of iterative reconstruction technique. COMPARISON:  Head CT 09/20/2021.  MRI brain 09/20/2021. FINDINGS: Brain: No acute hemorrhage, mass effect or midline shift. Gray-white differentiation is preserved. No hydrocephalus. No extra-axial collection. Basilar cisterns are patent. Vascular: No hyperdense vessel or unexpected calcification. Skull: No calvarial fracture or suspicious bone lesion. Skull base is unremarkable. Sinuses/Orbits: Unremarkable. Other: None. IMPRESSION: No acute intracranial abnormality. Electronically Signed   By: Emmit Alexanders M.D.   On: 01/20/2023 15:35   CT Chest Wo Contrast  Result Date: 01/18/2023 CLINICAL DATA:  Bloody secretions. EXAM: CT CHEST WITHOUT CONTRAST TECHNIQUE: Multidetector CT imaging of the chest was performed following the standard protocol without IV contrast. RADIATION DOSE REDUCTION: This exam was performed according to the departmental dose-optimization program which includes automated exposure control, adjustment of the mA and/or kV according to patient size and/or use of iterative reconstruction technique. COMPARISON:  Chest x-ray earlier today. FINDINGS: Cardiovascular:  Mild cardiac enlargement with small amount of pericardial fluid. Normal caliber thoracic aorta and central pulmonary arteries. Some calcified coronary artery plaque is visible in the distribution of the LAD. Mediastinum/Nodes: Tracheostomy tube in normal position. No enlarged mediastinal or axillary lymph nodes. Thyroid gland, trachea, and esophagus demonstrate no significant findings. Lungs/Pleura: Patchy ground-glass opacity in both upper lobes and to a lesser extent in the right middle lobe and both lower lobes. This can be on the basis of pneumonia, pulmonary hemorrhage or pulmonary edema. No pleural fluid, pneumothorax or nodule identified. Upper Abdomen: No acute abnormality. Musculoskeletal: No chest wall mass or suspicious bone lesions identified. IMPRESSION: 1. Patchy ground-glass opacity in both upper lobes and to a lesser extent in the right middle lobe and both lower lobes. This can be on the basis of pneumonia, pulmonary hemorrhage or pulmonary edema. 2. Mild cardiac enlargement  with small amount of pericardial fluid. 3. Coronary atherosclerosis with calcified coronary artery plaque in the distribution of the LAD. Electronically Signed   By: Aletta Edouard M.D.   On: 01/18/2023 16:43   DG Chest 2 View  Result Date: 01/18/2023 CLINICAL DATA:  Shortness of breath, chest pain, hemoptysis, fever, cough, and congestion for 2 days, EXAM: CHEST - 2 VIEW COMPARISON:  12/12/2022 FINDINGS: Tracheostomy tube projects over tracheal air column above carina. Enlargement of cardiac silhouette. Mediastinal contours and pulmonary vascularity normal. Subsegmental atelectasis RIGHT base, increased. Remaining lungs clear. No infiltrate, pleural effusion, or pneumothorax. Osseous structures unremarkable. IMPRESSION: RIGHT basilar atelectasis. Enlargement of cardiac silhouette. Electronically Signed   By: Lavonia Dana M.D.   On: 01/18/2023 14:24     Time coordinating discharge: Over 30 minutes    Dwyane Dee,  MD  [facility] 01/28/2023, 3:18 PM

## 2023-01-30 ENCOUNTER — Telehealth: Payer: Self-pay

## 2023-01-30 ENCOUNTER — Telehealth: Payer: Self-pay | Admitting: *Deleted

## 2023-01-30 ENCOUNTER — Ambulatory Visit: Payer: Self-pay | Admitting: Licensed Clinical Social Worker

## 2023-01-30 NOTE — Transitions of Care (Post Inpatient/ED Visit) (Signed)
   01/30/2023  Name: Maureen Jones MRN: [id number] DOB: Jul 29, 1966  Today's TOC FU Call Status: Today's TOC FU Call Status:: Successful TOC FU Call Competed TOC FU Call Complete Date: 01/30/23  Transition Care Management Follow-up Telephone Call Date of Discharge: 01/28/23 Discharge [facility] Type of Discharge: Inpatient Admission Primary Inpatient Discharge Diagnosis:: Acute Renal Failure Stage 5 How have you been since you were released from the hospital?: Better Any questions or concerns?: No  Items Reviewed: Did you receive and understand the discharge instructions provided?: No Medications obtained and verified?: Yes (Medications Reviewed) Any new allergies since your discharge?: No Dietary orders reviewed?: Yes Type of Diet Ordered:: Diet renal/carb modified with fluid restriction Diet-HS Snack? Nothing; Fluid restriction: 1800 mL Fluid; Room service appropriate? Yes; Fluid consistency: Thin Do you have support at home?: Yes (Limited) Name of Support/Comfort Primary Source: friends  Home Care and Equipment/Supplies: [place] Ordered?: Yes Name of [place]:: [place] set up a time to come to your home?: No EMR reviewed for [place] Orders: Orders present/patient has not received call (refer to CM for follow-up) Any new equipment or medical supplies ordered?: Yes Name of Medical supply agency?: [employer] Kasandra Knudsen Were you able to get the equipment/medical supplies?: Yes Do you have any questions related to the use of the equipment/supplies?: No  Functional Questionnaire: Do you need assistance with bathing/showering or dressing?: No Do you need assistance with meal preparation?: No Do you need assistance with eating?: No Do you have difficulty maintaining continence: No Do you need assistance with getting out of bed/getting out of a chair/moving?: No Do you have difficulty managing or taking your  medications?: No  Folllow up appointments reviewed: PCP Follow-up appointment confirmed?: Yes Date of PCP follow-up appointment?: 01/31/23 Follow-up Provider: Wilfred Lacy, NP Specialist Hospital Follow-up appointment confirmed?: NA Do you understand care options if your condition(s) worsen?: Yes-patient verbalized understanding  SDOH Interventions Today    Flowsheet Row Most Recent Value  SDOH Interventions   Food Insecurity Interventions Intervention Not Indicated  Housing Interventions Intervention Not Indicated  [Patient moving out of state this month]  Transportation Interventions Other (Comment)  [provided [facility] transportation line since PCP appt tomorrow.  Patient too young for [place] transportation]      Johnney Killian, RN, BSN, CCM Care Management Coordinator [place] Phone: [phone]: [phone]

## 2023-01-30 NOTE — Telephone Encounter (Signed)
  Care Coordination  Note  01/30/2023 Name: Kaydyn Basa MRN: [id number] DOB: 02-05-1966  Bentlie Dorazio is a 57 y.o. year old primary care patient of Nche, Charlene Brooke, NP. I reached out to [name] by phone today to assist with scheduling a follow up appointment. Lilyauna Beckert verbally consented to my assistance.       Follow up plan: Hospital Follow Up appointment scheduled with  Nche, Charlene Brooke, NP on 01/31/23 at [place]  Direct Dial: [phone]

## 2023-01-30 NOTE — Patient Outreach (Signed)
  Care Coordination   Follow Up Visit Note   01/30/2023 Name: Maureen Jones MRN: [id number] DOB: May 15, 1966  Maureen Jones is a 57 y.o. year old female who sees Nche, Charlene Brooke, NP for primary care. I spoke with  Maureen Jones by phone today.  What matters to the patients health and wellness today? Client is moving on February 05, 2023 to [place]             This Visit's Progress    Patient Stated she is moving on February 05, 2023 to [place]       Interventions: Discussed housing needs with client today via phone. She is moving on February 05, 2023 to [place] program support. She applied for LIEPP help and was approved for LIEPP support in amount of $600.00 Provided counseling support Encouraged client to call LCSW at [phone] as needed for SW support        SDOH assessments and interventions completed:  Yes    Care Coordination Interventions:  Yes, provided   Interventions Today    Flowsheet Row Most Recent Value  Chronic Disease   Chronic disease during today's visit Other  [discussed housing needs]  General Interventions   General Interventions Discussed/Reviewed General Interventions Discussed  [discussed housing needs, utility bill needs]  Education Interventions   Education Provided Provided Education  [Discussed LIEPP program support]  Provided Verbal Education On [employer]        Follow up plan: No further intervention required.   Encounter Outcome:  Pt. Visit Completed   Maureen Jones.Vincenta Steffey MSW, [place] Holiday representative [facility] Care Management [phone]

## 2023-01-30 NOTE — Patient Instructions (Signed)
Visit Information  Thank you for taking time to visit with me today. Please don't hesitate to contact me if I can be of assistance to you before our next scheduled telephone appointment.  Following are the goals we discussed today:   No further interventions are needed at present  Please call the care guide team at [phone] if you need to cancel or reschedule your appointment.   If you are experiencing a Mental Health or [place] or need someone to talk to, please go to [facility] [place] [phone])   Following is a copy of your full plan of care:   Interventions: Discussed housing needs with client today via phone. She is moving on February 05, 2023 to [place] program support. She applied for LIEPP help and was approved for LIEPP support in amount of $600.00 Provided counseling support Encouraged client to call LCSW at [phone] as needed for SW support  [name] was given information about Care Management services by the embedded care coordination team including:  Care Management services include personalized support from designated clinical staff supervised by her physician, including individualized plan of care and coordination with other care providers 24/7 contact phone numbers for assistance for urgent and routine care needs. The patient may stop CCM services at any time (effective at the end of the month) by phone call to the office staff.  Patient agreed to services and verbal consent obtained.   Norva Riffle.Eriyanna Kofoed MSW, [place] Holiday representative [facility] Care Management [phone]

## 2023-01-30 NOTE — Telephone Encounter (Signed)
Spoke with patient regarding transportation request made through the [place]. Transportation scheduled for 01/31/23 and 02/01/2023. Transportation waiver review with the patient and patient verbally consented.  [place] Manager, Patient and [facility] [employer]formation

## 2023-01-31 ENCOUNTER — Encounter: Payer: Self-pay | Admitting: Nurse Practitioner

## 2023-01-31 ENCOUNTER — Ambulatory Visit (INDEPENDENT_AMBULATORY_CARE_PROVIDER_SITE_OTHER): Payer: Medicare Other | Admitting: Nurse Practitioner

## 2023-01-31 ENCOUNTER — Telehealth: Payer: Self-pay | Admitting: Nurse Practitioner

## 2023-01-31 VITALS — BP 132/68 | HR 80 | Temp 98.9°F | Ht 66.0 in | Wt 231.0 lb

## 2023-01-31 DIAGNOSIS — J13 Pneumonia due to Streptococcus pneumoniae: Secondary | ICD-10-CM | POA: Diagnosis not present

## 2023-01-31 DIAGNOSIS — D631 Anemia in chronic kidney disease: Secondary | ICD-10-CM

## 2023-01-31 DIAGNOSIS — R8290 Unspecified abnormal findings in urine: Secondary | ICD-10-CM | POA: Diagnosis not present

## 2023-01-31 DIAGNOSIS — Z930 Tracheostomy status: Secondary | ICD-10-CM | POA: Diagnosis not present

## 2023-01-31 DIAGNOSIS — E1142 Type 2 diabetes mellitus with diabetic polyneuropathy: Secondary | ICD-10-CM

## 2023-01-31 DIAGNOSIS — Z794 Long term (current) use of insulin: Secondary | ICD-10-CM

## 2023-01-31 DIAGNOSIS — N185 Chronic kidney disease, stage 5: Secondary | ICD-10-CM | POA: Diagnosis not present

## 2023-01-31 LAB — CBC
HCT: 25.8 % — ABNORMAL LOW (ref 36.0–46.0)
Hemoglobin: 8.2 g/dL — ABNORMAL LOW (ref 12.0–15.0)
MCHC: 31.6 g/dL (ref 30.0–36.0)
MCV: 91.2 fl (ref 78.0–100.0)
Platelets: 303 10*3/uL (ref 150.0–400.0)
RBC: 2.83 Mil/uL — ABNORMAL LOW (ref 3.87–5.11)
RDW: 14.9 % (ref 11.5–15.5)
WBC: 16.5 10*3/uL — ABNORMAL HIGH (ref 4.0–10.5)

## 2023-01-31 LAB — GLUCOSE, POCT (MANUAL RESULT ENTRY): POC Glucose: 137 mg/dl — AB (ref 70–99)

## 2023-01-31 MED ORDER — TORSEMIDE 40 MG PO TABS: 80.0000 mg | ORAL_TABLET | Freq: Two times a day (BID) | ORAL | 0 refills | Status: DC

## 2023-01-31 NOTE — Patient Instructions (Addendum)
Please establish with new primary care provider, nephrology and endocrinology in [place] as soon as possible. Send medical release form to get your medical record faxed to new provider Maintain current medications Go to lab

## 2023-01-31 NOTE — Telephone Encounter (Signed)
Talked to Dr. Madelon Lips about need for appt this week due to upcoming move to [place] on 02/05/2023 and 10lbs weight gain since hospital discharge. Dr. Benjamine Mola stated her office will contact Ms. Murton to schedule an appt. She also suggested for torsemide dose to be increased to 68m BID. LVM  for Ms. HPhylliss Bobto call office

## 2023-01-31 NOTE — Progress Notes (Signed)
Established Patient Visit  Patient: Maureen Jones   DOB: 1966/05/09   57 y.o. Female  MRN: [id number] Visit Date: 02/06/2023  Subjective:    Chief Complaint  Patient presents with   Hospitalization [facility] follow up per patient seen for UTI and pneumonia, patient states that she have much improved but felt a little dizzy with some headaches that come and go started last night.    HPI Hospitalization: 02/07 to [address]2717/24 TCM call completed 01/30/2023 Diagnosed with CAP, acute on chronic CKD and symptomatic anemia. 1unit of PRBC transfused, pneumonia Treated with azithromycin and rocephin.  No dialysis performed due to her move to [place] in 5days. Torsemide and renvela prescribed. Today she report intermittent dizziness and headache since hospital discharge Denies any syncope or chest pain or cough or fever or syncope. Glucose this AM at home 267, repeat glucose in office 137. Upcoming relocation to [place] on 02/05/2023. she plans to live with her cousin. She states her cousin will assist in finding a primary care provider, endocrinologist, cardiology and nephrology.  BP Readings from Last 3 Encounters:  01/31/23 132/68  01/28/23 (!) 149/73  12/12/22 (!) 172/80    Wt Readings from Last 3 Encounters:  01/31/23 231 lb (104.8 kg)  01/26/23 220 lb 10.9 oz (100.1 kg)  11/23/22 244 lb (110.7 kg)    Reviewed hospital lab results (CBC, BMP, glucose, magnesium,  cmp, urine sodium, phosphorus, B12, procalcitonin, hgbA1c, uric acid, iron, folate, bnp,  respiratory swab) and radiology reports (CT head, CT, chest, and CXRs).  Reviewed medical, surgical, and social history today  Medications: Outpatient Medications Prior to Visit  Medication Sig   amLODipine (NORVASC) 10 MG tablet Take 1 tablet (10 mg total) by mouth at bedtime.   atorvastatin (LIPITOR) 20 MG tablet Take 1 tablet (20 mg total) by mouth daily.   azelastine (ASTELIN) 0.1 % nasal spray Place 2 sprays into  both nostrils 2 (two) times daily.   carvedilol (COREG) 25 MG tablet Take 1 tablet (25 mg total) by mouth 2 (two) times daily.   cloNIDine (CATAPRES) 0.3 MG tablet Take 1 tablet (0.3 mg total) by mouth 3 (three) times daily.   Continuous Blood Gluc Sensor (FREESTYLE LIBRE 14 DAY SENSOR) MISC 1 Units by Does not apply route every 14 (fourteen) days.   Cyanocobalamin (VITAMIN B12 PO) Take 1 tablet by mouth daily.   DULoxetine (CYMBALTA) 30 MG capsule Take 1 capsule (30 mg total) by mouth daily.   famotidine (PEPCID) 20 MG tablet Take 1 tablet (20 mg total) by mouth at bedtime.   ferrous sulfate 325 (65 FE) MG tablet Take 1 tablet (325 mg total) by mouth daily with breakfast.   fluticasone (FLONASE) 50 MCG/ACT nasal spray Place 2 sprays into both nostrils daily. (Patient taking differently: Place 1-2 sprays into both nostrils 2 (two) times daily.)   glucose blood (ONETOUCH VERIO) test strip Use as instructed   Humidifier MISC 1 Device as directed.   hydrALAZINE (APRESOLINE) 100 MG tablet Take 1 tablet (100 mg total) by mouth 3 (three) times daily.   Insulin Pen Needle 32G X 4 MM MISC Use to inject insulin up to 4 times daily as directed   isosorbide mononitrate (IMDUR) 30 MG 24 hr tablet Take 1 tablet (30 mg total) by mouth daily.   levocetirizine (XYZAL) 5 MG tablet Take 1 tablet (5 mg total) by mouth every evening.  OXYGEN Inhale 4 L/min into the lungs at bedtime.   pantoprazole (PROTONIX) 40 MG tablet Take 1 tablet (40 mg total) by mouth daily. (Patient taking differently: Take 40 mg by mouth daily before breakfast.)   promethazine (PHENERGAN) 25 MG tablet Take 1 tablet (25 mg total) by mouth every 8 (eight) hours as needed for nausea or vomiting.   sevelamer carbonate (RENVELA) 800 MG tablet Take 1 tablet (800 mg total) by mouth 3 (three) times daily with meals.   sodium bicarbonate 650 MG tablet Take 1 tablet (650 mg total) by mouth 2 (two) times daily.   traZODone (DESYREL) 50 MG tablet Take  1 tablet (50 mg total) by mouth at bedtime.   TRESIBA FLEXTOUCH 200 UNIT/ML FlexTouch Pen Inject 18 Units into the skin 2 (two) times daily. (Patient taking differently: Inject 32 Units into the skin 2 (two) times daily.)   VITAMIN E PO Take 1 capsule by mouth daily.   [DISCONTINUED] torsemide 60 MG TABS Take 60 mg by mouth 2 (two) times daily.   No facility-administered medications prior to visit.   Reviewed past medical and social history.   ROS per HPI above  Last CBC Lab Results  Component Value Date   WBC 16.5 (H) 01/31/2023   HGB 8.2 (L) 01/31/2023   HCT 25.8 (L) 01/31/2023   MCV 91.2 01/31/2023   MCH 29.0 01/28/2023   RDW 14.9 01/31/2023   PLT 303.0 0000000   Last metabolic panel Lab Results  Component Value Date   GLUCOSE 186 (H) 01/28/2023   NA 135 01/28/2023   K 3.7 01/28/2023   CL 98 01/28/2023   CO2 22 01/28/2023   BUN 112 (H) 01/28/2023   CREATININE 9.74 (H) 01/28/2023   GFRNONAA 4 (L) 01/28/2023   CALCIUM 7.4 (L) 01/28/2023   PHOS 9.0 (H) 01/28/2023   PROT 6.8 01/25/2023   ALBUMIN 2.6 (L) 01/28/2023   BILITOT 0.7 01/25/2023   ALKPHOS 123 01/25/2023   AST 17 01/25/2023   ALT 13 01/25/2023   ANIONGAP 15 01/28/2023   Last hemoglobin A1c Lab Results  Component Value Date   HGBA1C 7.6 (H) 01/18/2023      Objective:  BP 132/68 (BP Location: Left Arm, Patient Position: Sitting, Cuff Size: Large)   Pulse 80   Temp 98.9 F (37.2 C) (Temporal)   Ht '5\' 6"'$  (1.676 m)   Wt 231 lb (104.8 kg)   SpO2 90%   BMI 37.28 kg/m      Physical Exam Vitals reviewed.  Cardiovascular:     Rate and Rhythm: Normal rate and regular rhythm.     Pulses: Normal pulses.     Heart sounds: Normal heart sounds.  Pulmonary:     Effort: Pulmonary effort is normal.     Breath sounds: Normal breath sounds.  Musculoskeletal:     Right lower leg: Edema present.     Left lower leg: Edema present.  Neurological:     Mental Status: She is alert and oriented to person,  place, and time.     Cranial Nerves: No cranial nerve deficit.     Motor: No weakness.  Psychiatric:        Mood and Affect: Mood normal.        Behavior: Behavior normal.        Thought Content: Thought content normal.     Results for orders placed or performed in visit on 01/31/23  CBC  Result Value Ref Range   WBC 16.5 (H) 4.0 -  10.5 K/uL   RBC 2.83 (L) 3.87 - 5.11 Mil/uL   Platelets 303.0 150.0 - 400.0 K/uL   Hemoglobin 8.2 (L) 12.0 - 15.0 g/dL   HCT 25.8 (L) 36.0 - 46.0 %   MCV 91.2 78.0 - 100.0 fl   MCHC 31.6 30.0 - 36.0 g/dL   RDW 14.9 11.5 - 15.5 %  Urinalysis w microscopic + reflex cultur   Specimen: Urine  Result Value Ref Range   Color, Urine YELLOW YELLOW   APPearance CLEAR CLEAR   Specific Gravity, Urine 1.014 1.001 - 1.035   pH 5.5 5.0 - 8.0   Glucose, UA 1+ (A) NEGATIVE   Bilirubin Urine NEGATIVE NEGATIVE   Ketones, ur NEGATIVE NEGATIVE   Hgb urine dipstick NEGATIVE NEGATIVE   Protein, ur 3+ (A) NEGATIVE   Nitrites, Initial NEGATIVE NEGATIVE   Leukocyte Esterase NEGATIVE NEGATIVE   WBC, UA 0-5 0 - 5 /HPF   RBC / HPF NONE SEEN 0 - 2 /HPF   Squamous Epithelial / HPF 0-5 < OR = 5 /HPF   Bacteria, UA NONE SEEN NONE SEEN /HPF   Hyaline Cast NONE SEEN NONE SEEN /LPF   Note    REFLEXIVE URINE CULTURE  Result Value Ref Range   Reflexve Urine Culture    POCT Glucose (CBG)  Result Value Ref Range   POC Glucose 137 (A) 70 - 99 mg/dl      Assessment & Plan:    Problem List Items Addressed This Visit       Respiratory   CAP (community acquired pneumonia) due to Pneumococcus ([place])   Relevant Orders   CBC (Completed)     Endocrine   DM (diabetes mellitus) ([place]) - Primary    No hypoglycemia Maintain current insulin dose      Relevant Orders   POCT Glucose (CBG) (Completed)     Genitourinary   CKD (chronic kidney disease) stage 5, GFR less than 15 ml/min (HCC)    10lbs weight gain noted today. She reports he is compliant with low sodium diet and  current med doses. We discussed need for f/up appt with nephrology before her departure to [place]. She agreed to schedule appt. I discussed patient care with Dr. Hollie Salk via telephone. She advised to increased torsemide to '80mg'$  BID and her office will reach out to patient to schedule f/up appt this week. Informed Ms. Hardbarger about above plan. She verbalized understanding. 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     Wilfred Lacy, NP

## 2023-02-01 ENCOUNTER — Ambulatory Visit (HOSPITAL_COMMUNITY)
Admission: RE | Admit: 2023-02-01 | Discharge: 2023-02-01 | Disposition: A | Discharge status: Home / Self Care | Payer: Medicare Other | Source: Ambulatory Visit | Attending: Acute Care | Admitting: Acute Care

## 2023-02-01 DIAGNOSIS — G4733 Obstructive sleep apnea (adult) (pediatric): Secondary | ICD-10-CM | POA: Diagnosis present

## 2023-02-01 DIAGNOSIS — Z930 Tracheostomy status: Secondary | ICD-10-CM | POA: Diagnosis not present

## 2023-02-01 DIAGNOSIS — K219 Gastro-esophageal reflux disease without esophagitis: Secondary | ICD-10-CM | POA: Diagnosis not present

## 2023-02-01 DIAGNOSIS — J383 Other diseases of vocal cords: Secondary | ICD-10-CM | POA: Diagnosis present

## 2023-02-01 DIAGNOSIS — J386 Stenosis of larynx: Secondary | ICD-10-CM | POA: Diagnosis present

## 2023-02-01 LAB — URINALYSIS W MICROSCOPIC + REFLEX CULTURE
Bacteria, UA: NONE SEEN /HPF
Bilirubin Urine: NEGATIVE
Hgb urine dipstick: NEGATIVE
Hyaline Cast: NONE SEEN /LPF
Ketones, ur: NEGATIVE
Leukocyte Esterase: NEGATIVE
Nitrites, Initial: NEGATIVE
RBC / HPF: NONE SEEN /HPF (ref 0–2)
Specific Gravity, Urine: 1.014 (ref 1.001–1.035)
pH: 5.5 (ref 5.0–8.0)

## 2023-02-01 LAB — NO CULTURE INDICATED

## 2023-02-01 NOTE — Progress Notes (Signed)
Reason for visit Trach change  HPI Maureen Jones presents today for f/u trach care. She has known h/o VCD, I have been following her for trach management. I recently referred her to [facility] ENT w/ concern for Subglottic stenosis. 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  Presents today for planned trach change   Review of Systems  Constitutional: Negative.   HENT: Negative.    Eyes: Negative.   Respiratory: Negative.    Cardiovascular: Negative.   Gastrointestinal: Negative.   Genitourinary: Negative.   Musculoskeletal: Negative.   Skin: Negative.   Endo/Heme/Allergies: Negative.     Exam   General 57 year old female who presents today for planned trach change HENT NCAT trach site unremarkable. Phonation quality weak but no worse than baseline. Still talks in 1-2 word phrases Pulm clear Card rrr Abd soft Ext warm and dry  Neuro intact   Procedures The 5 xlt was removed. Site inspected and was unremarkable. New trach placed. Placement confirmed via ETCO2   Active Problems:   Tracheostomy dependence (HCC)   Vocal cord dysfunction   OSA (obstructive sleep apnea)   Subglottic stenosis   LPRD (laryngopharyngeal reflux disease)  Tracheostomy dependence (HCC) Overview: Size 5 prox xlt/ last change 2/21  Discussion Not candidate for decannulation until subglottic stenosis addressed.  Changing every 4 weeks due to granulation tissue at stoma site Will need ENT follow when she moves (she will establish)   Plan  Will see again in 4 weeks for planned change as long as she still lives in [place] ACNP-BC [place] Pager # [phone] OR # [phone] if no answer

## 2023-02-01 NOTE — Progress Notes (Signed)
Tracheostomy Procedure Note  Solmarie Hilgendorf [id number] 30-Dec-1965  Pre Procedure Tracheostomy Information  Trach Brand: Shiley Size:  6.0 6.0 XLTUP Style: Uncuffed Secured by: Velcro   Procedure: Trach Cleaning and Trach Change    Post Procedure Tracheostomy Information  Trach Brand: Shiley Size:  6.0 60 XLTUP Style: Uncuffed Secured by: Velcro   Post Procedure Evaluation:  ETCO2 positive color change from yellow to purple : Yes.   Vital signs:VSS Patients current condition: stable Complications: No apparent complications Trach site exam: clean, dry Wound care done: 4 x 4 gauze drain Patient did tolerate procedure well.   Education: none  Prescription needs: none    Additional needs:  An additional trach given to patient at this visit

## 2023-02-02 ENCOUNTER — Telehealth: Payer: Self-pay | Admitting: Nurse Practitioner

## 2023-02-02 NOTE — Telephone Encounter (Signed)
Called patient to schedule Medicare Annual Wellness Visit (AWV). Unable to reach patient.  Last date of AWV:  DUE 03/13/2019 AWVI PER [place]  Please schedule an appointment at any time with [facility] [place] .  If any questions, please contact me at [phone].  Thank you ,  Barkley Boards AWV direct phone # [phone]

## 2023-02-04 ENCOUNTER — Other Ambulatory Visit: Payer: Self-pay | Admitting: Nurse Practitioner

## 2023-02-04 DIAGNOSIS — F5101 Primary insomnia: Secondary | ICD-10-CM

## 2023-02-06 ENCOUNTER — Telehealth: Payer: Self-pay | Admitting: Nurse Practitioner

## 2023-02-06 ENCOUNTER — Ambulatory Visit: Payer: Self-pay | Admitting: Licensed Clinical Social Worker

## 2023-02-06 NOTE — Telephone Encounter (Signed)
Maureen Jones reports she was not able to maintain appt with nephrology prior to her departure to [place] due to lack of transportation. She also reports she forgot to increase torsemide dose to '80mg'$  BID as previously instructed. She is currently in [place] with her cousin. She also reports worsening LE edema. She denies any SOB or cough or CP or dizziness or nausea or fever at this time.She has no access to BP machine or scale at this time. I advised her to increase torsemide dose as instructed; needs to monitor BP, glucose and weight daily; needs to schedule appt with new pcp, nephrology and endocrinology; needs to go to a local ED if weight gain >5lbs in 3days or elevated BP or elevated glucose. She verbalized understanding.

## 2023-02-06 NOTE — Assessment & Plan Note (Addendum)
10lbs weight gain noted today. She reports he is compliant with low sodium diet and current med doses. We discussed need for f/up appt with nephrology before her departure to [place]. She agreed to schedule appt. I discussed patient care with Dr. Hollie Salk via telephone. She advised to increased torsemide to '80mg'$  BID and her office will reach out to patient to schedule f/up appt this week. Informed Ms. Pradia about above plan. She verbalized understanding. Asked Ms. Fullman to consider changing relocation date, so we can stabilize her health, but she declined. BP Readings from Last 3 Encounters:  01/31/23 132/68  01/28/23 (!) 149/73  12/12/22 (!) 172/80    Wt Readings from Last 3 Encounters:  01/31/23 231 lb (104.8 kg)  01/26/23 220 lb 10.9 oz (100.1 kg)  11/23/22 244 lb (110.7 kg)

## 2023-02-06 NOTE — Patient Outreach (Signed)
  Care Coordination   Follow Up Visit Note   02/06/2023 Name: Kielyn Dudziak MRN: [id number] DOB: 1966-11-20  Dinorah Horan is a 57 y.o. year old female who sees Nche, Charlene Brooke, NP for primary care. I spoke with [name], sister of client via phone today about client status. .  What matters to the patients health and wellness today?  Patient wants to talk with LCSW about managing depression issues and symptoms    Goals Addressed               This Visit's Progress     Patient wants to talk with LCSW about managing depression issues and symptoms (pt-stated)        Interventions:   LCSW called [name], sister of client, via phone today to talk with [name] about client recent move to [place].  [name] reported to LCSW that client moved to [place] on February 05, 2023 Client is in process of getting an apartment in [place]. Client will be living close to family members in [place]. Client will establish care with PCP in [place].   LCSW talked with [name] about client communication with her PCP in [place], Dr. Wilfred Lacy.  LCSW discussed with [name] that client may need to complete consent form if she desires for her medical record from Dr. Lorayne Marek to be transferred to new PCP in [place].   [name] said family members were helping client already to complete errands since client has arrived in [place] to reside. LCSW thanked [name] for phone call with LCSW today         SDOH assessments and interventions completed:  Yes  SDOH Interventions Today    Flowsheet Row Most Recent Value  SDOH Interventions   Depression Interventions/Treatment  Medication  Stress Interventions Other (Comment)  [client has stress in managing medical needs]        Care Coordination Interventions:  Yes, provided   Interventions Today    Flowsheet Row Most Recent Value  Chronic Disease   Chronic disease during today's visit Other  [spoke with [name], sister of client, about client move to [place] on 02/05/23]  General Interventions   General Interventions Discussed/Reviewed General Interventions Discussed        Follow up plan: No further intervention required.   Encounter Outcome:  Pt. Visit Completed   Norva Riffle.Lynard Postlewait MSW, [place] representative [facility] Care Management [phone]

## 2023-02-06 NOTE — Patient Instructions (Signed)
Visit Information  Thank you for taking time to visit with me today. Please don't hesitate to contact me if I can be of assistance to you before our next scheduled telephone appointment.  Following are the goals we discussed today:   No further intervention required  Please call the care guide team at [phone] if you need to cancel or reschedule your appointment.   If you are experiencing a Mental Health or [place] or need someone to talk to, please go to [facility] [place] [phone])   Following is a copy of your full plan of care:  Interventions:   LCSW called [name], sister of client, via phone today to talk with [name] about client recent move to [place].  [name] reported to LCSW that client moved to [place] on February 05, 2023 Client is in process of getting an apartment in [place]. Client will be living close to family members in [place]. Client will establish care with PCP in [place].   LCSW talked with [name] about client communication with her PCP in [place], Dr. Wilfred Lacy.  LCSW discussed with [name] that client may need to complete consent form if she desires for her medical record from Dr. Lorayne Marek to be transferred to new PCP in [place].   [name] said family members were helping client already to complete errands since client has arrived in [place] to reside. LCSW thanked [name] for phone call with LCSW today  Ms. Hurlbert was given information about Care Management services by the embedded care coordination team including:  Care Management services include personalized support from designated clinical staff supervised by her physician, including individualized plan of care and coordination with other care providers 24/7 contact phone numbers for assistance for urgent and routine care needs. The patient may stop CCM services at any time (effective at the end of  the month) by phone call to the office staff.  Patient agreed to services and verbal consent obtained.   Norva Riffle.Kathia Covington MSW, [place] representative [facility] Care Management [phone]

## 2023-02-06 NOTE — Assessment & Plan Note (Signed)
No hypoglycemia Maintain current insulin dose

## 2023-02-07 ENCOUNTER — Encounter (HOSPITAL_COMMUNITY): Payer: Medicare Other

## 2023-02-14 ENCOUNTER — Other Ambulatory Visit: Payer: Self-pay | Admitting: Nurse Practitioner

## 2023-02-14 DIAGNOSIS — N185 Chronic kidney disease, stage 5: Secondary | ICD-10-CM

## 2023-02-21 ENCOUNTER — Other Ambulatory Visit: Payer: Self-pay | Admitting: Nurse Practitioner

## 2023-02-21 DIAGNOSIS — F5101 Primary insomnia: Secondary | ICD-10-CM

## 2023-02-23 ENCOUNTER — Other Ambulatory Visit: Payer: Self-pay | Admitting: Nurse Practitioner

## 2023-02-23 NOTE — Progress Notes (Unsigned)
Care Management & Coordination Services Pharmacy Note  02/23/2023 Name:  Maureen Jones MRN:  [id number] DOB:  [birth year]  Summary: Patient presents for follow-up consult.   Patient has been sick the past few days. She reports nausea, one episode of vomiting. Her blood sugars have also been variable and patient reports low overall appetite.   She had previously received patient assistance application for Tyler Aas, but mailed it into [employer] without bringing it to her PCP first. Will resend PAP.   Recommendations/Changes made from today's visit: Continue current medications  Follow up plan: CPP follow-up 1 month  Subjective: Maureen Jones is an 57 y.o. year old female who is a primary patient of Nche, Charlene Brooke, NP.  The care coordination team was consulted for assistance with disease management and care coordination needs.    Engaged with patient by telephone for follow up visit.  Patient Care Team: Nche, Charlene Brooke, NP as PCP - General (Internal Medicine) Germaine Pomfret, [facility] (Pharmacist) Shea Evans Norva Riffle, LCSW as [place] [facility] (Licensed Clinical Social Worker)  Recent office visits: 01/31/23: Patient presented to Wilfred Lacy, NP for hospital follow-up. Torsemide 80 mg BID.   10/27/22: Patient presented to Wilfred Lacy, NP for follow-up.  07/22/2022 Wilfred Lacy NP (PCP) Start Trazodone 50 mg nightly, Return in about 4 weeks   Recent consult visits: 08/09/2022 Dr. Sabino Gasser MD (otolaryngology) Increase her daily Flonase and Astelin use to twice daily, referral to [facility],   07/14/2022 Lanae Crumbly DPM (Podiatry) Start triamcinolone cream (KENALOG) 0.1 %  Hospital visits: 2/7-2/17: Patient hospitalized for AKI.   12/12/22: Patient presented to ED for edema.  10/25-11/3: Patient hospitalized for gastroenteritis  Admitted to the hospital on 05/22/2022 due to Shortness of breath. Discharge date was  05/23/2022. Discharged from [facility].     Admitted to the hospital on 03/31/2022 due to Tracheostomy complication. Discharge date was 03/31/2022. Discharged from [facility].   Objective:  Lab Results  Component Value Date   CREATININE 9.74 (H) 01/28/2023   BUN 112 (H) 01/28/2023   GFR 6.98 (LL) 10/27/2022   GFRNONAA 4 (L) 01/28/2023   NA 135 01/28/2023   K 3.7 01/28/2023   CALCIUM 7.4 (L) 01/28/2023   CO2 22 01/28/2023   GLUCOSE 186 (H) 01/28/2023    Lab Results  Component Value Date/Time   HGBA1C 7.6 (H) 01/18/2023 10:05 PM   HGBA1C 8.5 (H) 07/04/2022 10:14 AM   GFR 6.98 (LL) 10/27/2022 12:19 PM   GFR 9.86 (LL) 07/04/2022 10:14 AM   MICROALBUR 334.3 (H) 10/27/2022 12:19 PM    Last diabetic Eye exam: No results found for: "HMDIABEYEEXA"  Last diabetic Foot exam: No results found for: "HMDIABFOOTEX"   Lab Results  Component Value Date   TRIG 212 (H) 10/14/2021       Latest Ref Rng & Units 01/28/2023    3:01 AM 01/27/2023    3:00 AM 01/26/2023    1:50 AM  Hepatic Function  Albumin 3.5 - 5.0 g/dL 2.6  2.5  2.5     Lab Results  Component Value Date/Time   TSH 3.531 01/20/2023 05:29 AM       Latest Ref Rng & Units 01/31/2023   10:53 AM 01/28/2023    3:01 AM 01/27/2023    3:00 AM  CBC  WBC 4.0 - 10.5 K/uL 16.5  11.9  10.6   Hemoglobin 12.0 - 15.0 g/dL 8.2  C 8.0  7.7   Hematocrit  36.0 - 46.0 % 25.8  C 24.6  24.5   Platelets 150.0 - 400.0 K/uL 303.0  300  291     C Corrected result    Lab Results  Component Value Date/Time   VITAMINB12 1,306 (H) 01/20/2023 05:29 AM   VITAMINB12 1,255 (H) 01/18/2023 03:22 PM    Clinical ASCVD: No  The 10-year ASCVD risk score (Arnett DK, et al., 2019) is: 23.8%   Values used to calculate the score:     Age: 57 years     Sex: Female     Is Non-Hispanic African American: Yes     Diabetic: Yes     Tobacco smoker: Yes     Systolic Blood Pressure: Q000111Q mmHg     Is BP treated: Yes     HDL  Cholesterol: 39 MG/DL     Total Cholesterol: 141 MG/DL       02/06/2023   11:34 AM 01/31/2023   10:09 AM 01/02/2023    2:47 PM  Depression screen PHQ 2/9  Decreased Interest 1 0 1  Down, Depressed, Hopeless 1 0 1  PHQ - 2 Score 2 0 2  Altered sleeping   2  Tired, decreased energy 1  2  Change in appetite 0  0  Feeling bad or failure about yourself  1  1  Trouble concentrating 1  1  Moving slowly or fidgety/restless 1  1  Suicidal thoughts 0  0  PHQ-9 Score   9  Difficult doing work/chores Somewhat difficult  Somewhat difficult    Social History   Tobacco Use  Smoking Status Former   Types: Cigarettes  Smokeless Tobacco Never   BP Readings from Last 3 Encounters:  01/31/23 132/68  01/28/23 (!) 149/73  12/12/22 (!) 172/80   Pulse Readings from Last 3 Encounters:  01/31/23 80  01/28/23 80  12/12/22 92   Wt Readings from Last 3 Encounters:  01/31/23 231 lb (104.8 kg)  01/26/23 220 lb 10.9 oz (100.1 kg)  11/23/22 244 lb (110.7 kg)   BMI Readings from Last 3 Encounters:  01/31/23 37.28 kg/m  01/26/23 35.62 kg/m  11/23/22 39.38 kg/m    No Known Allergies  Medications Reviewed Today     Reviewed by Lynda Rainwater, [place] (Certified Medical Assistant) on 01/31/23 at 1010  Med List Status: <None>   Medication Order Taking? Sig Documenting Provider Last Dose Status Informant  amLODipine (NORVASC) 10 MG tablet [id number] Yes Take 1 tablet (10 mg total) by mouth at bedtime. Nche, Charlene Brooke, NP Taking Active Self  atorvastatin (LIPITOR) 20 MG tablet [id number] Yes Take 1 tablet (20 mg total) by mouth daily. Nche, Charlene Brooke, NP Taking Active Self  azelastine (ASTELIN) 0.1 % nasal spray [id number] Yes Place 2 sprays into both nostrils 2 (two) times daily. [provider] Taking Active Self  carvedilol (COREG) 25 MG tablet [id number] Yes Take 1 tablet (25 mg total) by mouth 2 (two) times daily. Nche, Charlene Brooke, NP Taking Active Self  cloNIDine (CATAPRES) 0.3  MG tablet [id number] Yes Take 1 tablet (0.3 mg total) by mouth 3 (three) times daily. Nche, Charlene Brooke, NP Taking Active Self  Continuous Blood Gluc Sensor (FREESTYLE LIBRE 14 DAY SENSOR) [place] [id number] Yes 1 Units by Does not apply route every 14 (fourteen) days. Flossie Buffy, NP Taking Active Self  Cyanocobalamin (VITAMIN B12 PO) [id number] Yes Take 1 tablet by mouth daily. [provider] Taking Active Self  DULoxetine (CYMBALTA) 30 MG capsule [id number] Yes Take  1 capsule (30 mg total) by mouth daily. Nche, Charlene Brooke, NP Taking Active Self  famotidine (PEPCID) 20 MG tablet [id number] Yes Take 1 tablet (20 mg total) by mouth at bedtime. Nche, Charlene Brooke, NP Taking Active Self  ferrous sulfate 325 (65 FE) MG tablet [id number] Yes Take 1 tablet (325 mg total) by mouth daily with breakfast. Dwyane Dee, MD Taking Active   fluticasone [facility]) 50 MCG/ACT nasal spray [id number] Yes Place 2 sprays into both nostrils daily.  Patient taking differently: Place 1-2 sprays into both nostrils 2 (two) times daily.   Flossie Buffy, NP Taking Active Self  glucose blood [facility] VERIO) test strip [id number] Yes Use as instructed Nche, Charlene Brooke, NP Taking Active Self  Humidifier MISC [id number] Yes 1 Device as directed. [provider] Taking Active Self  hydrALAZINE (APRESOLINE) 100 MG tablet [id number] Yes Take 1 tablet (100 mg total) by mouth 3 (three) times daily. Flossie Buffy, NP Taking Active Self   Patient taking differently:   Discontinued 12/17/21 1307 (Reorder) Insulin Pen Needle 32G X 4 MM MISC [id number] Yes Use to inject insulin up to 4 times daily as directed Nche, Charlene Brooke, NP Taking Active Self  isosorbide mononitrate (IMDUR) 30 MG 24 hr tablet [id number] Yes Take 1 tablet (30 mg total) by mouth daily. Dwyane Dee, MD Taking Active   levocetirizine (XYZAL) 5 MG tablet [id number] Yes Take 1 tablet (5 mg total) by mouth every evening. Flossie Buffy, NP Taking Active Self  OXYGEN [id number] Yes Inhale 4 L/min into the lungs at bedtime. [provider] Taking Active Self  pantoprazole (PROTONIX) 40 MG tablet [id number] Yes Take 1 tablet (40 mg total) by mouth daily.  Patient taking differently: Take 40 mg by mouth daily before breakfast.   Nche, Charlene Brooke, NP Taking Active Self  promethazine (PHENERGAN) 25 MG tablet [id number] Yes Take 1 tablet (25 mg total) by mouth every 8 (eight) hours as needed for nausea or vomiting. Nche, Charlene Brooke, NP Taking Active Self  sevelamer carbonate (RENVELA) 800 MG tablet [id number] Yes Take 1 tablet (800 mg total) by mouth 3 (three) times daily with meals. Dwyane Dee, MD Taking Active   sodium bicarbonate 650 MG tablet [id number] Yes Take 1 tablet (650 mg total) by mouth 2 (two) times daily. Flossie Buffy, NP Taking Active Self  torsemide 60 MG TABS [id number] Yes Take 60 mg by mouth 2 (two) times daily. Dwyane Dee, MD Taking Active   traZODone (DESYREL) 50 MG tablet [id number] Yes Take 1 tablet (50 mg total) by mouth at bedtime. Flossie Buffy, NP Taking Active Self  TRESIBA FLEXTOUCH 200 UNIT/ML FlexTouch Pen [id number] Yes Inject 18 Units into the skin 2 (two) times daily.  Patient taking differently: Inject 32 Units into the skin 2 (two) times daily.   Geradine Girt, DO Taking Active Self  VITAMIN E PO [id number] Yes Take 1 capsule by mouth daily. 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   LPRD (laryngopharyngeal reflux disease) 08/09/2022   Difficulty with  speech 07/05/2022   Tracheostomy  status ([place])    Nausea & vomiting 02/13/2022   CAP (community acquired pneumonia) due to Pneumococcus ([place]) 12/06/2021   CKD (chronic kidney disease) stage 5, GFR less than 15 ml/min (HCC) 11/22/2021   Subglottic edema    Vocal cord dysfunction    Endotracheal tube present    Bilateral carpal tunnel syndrome 11/15/2019   Essential hypertension 11/15/2019   Insomnia 11/15/2019   Mild intermittent asthma without complication Q000111Q   OSA (obstructive sleep apnea) 11/15/2019   Seasonal allergic rhinitis 11/15/2019   Tobacco abuse 11/15/2019   Type 2 diabetes mellitus with hyperlipidemia ([place]) 11/15/2019   Morbid obesity ([place]) 11/15/2019     There is no immunization history on file for this patient.   Compliance/Adherence/Medication fill history: Care Gaps: Ophthalmology Exam Hepatitis C Screening Colonoscopy Mammogram Influenza Vaccine HTN  Star-Rating Drugs: Atorvastatin 20 mg last filled 06/29/20[facility]  Losartan 50 mg last filled 06/29/2022 [facility].   SDOH:  (Social Determinants of Health) assessments and interventions performed: Yes SDOH Interventions    Flowsheet Row Care Coordination from 02/06/2023 in [place] Telephone from 01/30/2023 in [place] ED to Hosp-Admission (Discharged) from 01/18/2023 in [place] Coordination from 01/02/2023 in [place] Telephone from 12/21/2022 in [place] Coordination from 12/20/2022 in [place] Interventions        Food Insecurity Interventions -- Intervention Not Indicated -- -- -- --  Housing Interventions -- Intervention Not Indicated  [Patient moving out of state this month] -- -- -- --  Transportation  Interventions -- Other (Comment)  [provided [facility] transportation line since PCP appt tomorrow.  Patient too young for [facility] transportation] Taxi Voucher Given, Inpatient TOC -- Contracted Vendor  [Has united health care transportation] --  Depression Interventions/Treatment  Medication -- -- Counseling -- Counseling  Physical Activity Interventions -- -- -- -- -- Other (Comments)  [walks slowly through her modular home. Said she could benefit from having a cane.]  Stress Interventions Other (Comment)  [client has stress in managing medical needs] -- -- Provide Counseling  [client has stress related to managing medical needs and managing financial needs] -- Provide Counseling  [client has stress related to managing medical needs. has some stress related to paying rent and paying her monthly bills]      SDOH Screenings   Food Insecurity: No Food Insecurity (01/30/2023)  Housing: Low Risk  (01/30/2023)  Transportation Needs: Unmet Transportation Needs (01/30/2023)  Depression (PHQ2-9): Medium Risk (02/06/2023)  Physical Activity: Inactive (12/20/2022)  Stress: Stress Concern Present (02/06/2023)  Tobacco Use: Medium Risk (01/31/2023)    Medication Assistance: Application for [employer]  medication assistance program. in process.  Anticipated assistance start date TBD.  See plan of care for additional detail.  Medication Access: Within the past 30 days, how often has patient missed a dose of medication? None Is a pillbox or other method used to improve adherence? Yes  Factors that may affect medication adherence? financial need and transportation problems Are meds synced by current pharmacy? No  Are meds delivered by current pharmacy? No  Does patient experience delays in picking up medications due to transportation concerns? No   [facility] Services Reviewed: Is patient disadvantaged to use [facility]?: No  Current Rx insurance plan: [facility] Name and [facility] of [id number]  [place], [place] [address] [place] [address] [phone] [phone]  [facility] services reviewed with patient today?: No  Patient requests to transfer care to [facility] Pharmacy?: No  Reason patient declined to change pharmacies: Loyalty to other pharmacy/Patient preference   Assessment/Plan  Hypertension (BP goal <140/90) -Uncontrolled -Current treatment: Amlodipine 10 mg nightly  Carvedilol 25 mg twice daily  Clonidine 0.3 mg three times daily  Hydralazine 100 mg three times daily  Torsemide 80 mg twice daily  -Medications previously tried: NA  -Current home readings:  -Denies hypotensive/hypertensive symptoms -Recommended to continue current medication  Hyperlipidemia: (LDL goal < 70) -Controlled -Current treatment: Atorvastatin 20 mg daily  -Medications previously tried: NA  -Recommended to continue current medication  Diabetes (A1c goal <8%) -Uncontrolled -Current medications: Tresiba 32 units twice daily  -Medications previously tried: NA  -Current home glucose readings fasting glucose: 69,    Afternoon: 264 -Reports hypoglycemic/hyperglycemic symptoms -Appetite is reduced  -Recommended to continue current medication  Insomnia (Goal: Improve sleep quality) -Uncontrolled -Current treatment  Trazodone 50 mg nightly  -Medications previously tried: NA  -Reports trazodone is ineffective most nights. She only gets 2-3 hours of sleep at most. She also has trouble falling asleep, typically requiring a few hours.  -Recommended to continue current medication  Allergic Rhinitis (Goal: Minimize symptoms) -Uncontrolled -Seasonal  -Current treatment  Asetalin nasal spray  Flonase 2 sprays daily  Levocetirizine 5 mg nightly  -Medications previously tried: Claritin, Pseudofed, Benadryll,  Zyrtec, Zinc  -Recommended to continue current medication  Chronic Kidney Disease Stage 5  -All medications assessed for renal  dosing and appropriateness in chronic kidney disease. -Recommended to continue current medication  Junius Argyle, PharmD, [place], CPP Clinical Pharmacist Practitioner  [facility]  [phone]

## 2023-02-25 ENCOUNTER — Encounter: Payer: Self-pay | Admitting: Nurse Practitioner

## 2023-04-24 ENCOUNTER — Other Ambulatory Visit: Payer: Self-pay | Admitting: Nurse Practitioner

## 2023-04-24 DIAGNOSIS — I160 Hypertensive urgency: Secondary | ICD-10-CM

## 2023-04-24 DIAGNOSIS — E1165 Type 2 diabetes mellitus with hyperglycemia: Secondary | ICD-10-CM

## 2023-04-25 ENCOUNTER — Other Ambulatory Visit: Payer: Self-pay | Admitting: Nurse Practitioner

## 2023-04-25 DIAGNOSIS — J4520 Mild intermittent asthma, uncomplicated: Secondary | ICD-10-CM

## 2023-07-13 ENCOUNTER — Other Ambulatory Visit: Payer: Self-pay | Admitting: Nurse Practitioner

## 2023-07-13 DIAGNOSIS — E1165 Type 2 diabetes mellitus with hyperglycemia: Secondary | ICD-10-CM

## 2023-09-13 ENCOUNTER — Other Ambulatory Visit: Payer: Self-pay | Admitting: Nurse Practitioner

## 2023-09-13 DIAGNOSIS — E1169 Type 2 diabetes mellitus with other specified complication: Secondary | ICD-10-CM

## 2024-06-09 ENCOUNTER — Other Ambulatory Visit: Payer: Self-pay | Admitting: Nurse Practitioner

## 2024-06-09 DIAGNOSIS — J4520 Mild intermittent asthma, uncomplicated: Secondary | ICD-10-CM

## 2024-06-10 NOTE — Telephone Encounter (Signed)
 Called and left a voice message per DPR on file asking to give me a call back at the office at [phone]

## 2024-06-10 NOTE — Telephone Encounter (Signed)
 Medication: Levocetirizine (Xyzal ) 5 mg  Directions: Take 1 tablet by mouth every evening daily  Last given: 04/25/23 Number refills: 3 Last o/v: 01/31/23 Follow up: 4 week (around 02/28/23)-NOT scheduled  Labs:

## 2024-07-02 NOTE — Discharge Summary (Signed)
 DISCHARGE SUMMARY  Discharge Final Diagnosis: 1.  Asthma exacerbation-resolved.  .  Prednisone  has tapered off.  Continue as needed albuterol  metered-dose inhaler   2.  Pneumonia-Pseudomonas pneumonia.  Appreciate input from ID.  Completed course of cefepime .  s  Without fever or other SIRS criteria.  Chest x-ray showed no new pneumonia, procalcitonin 0.31 procalcitonin remained stable   3.  History of obstructive sleep apnea-continue nightly CPAP.   4.  History of tracheostomy-secondary to vocal cord paralysis capped for removal of tracheostomy by Dr. Ortencia outpatient follow-up   5.  Diarrhea-negative for C. difficile and fecal leukocytes.  Diarrhea has resolved   6.  Pseudo hypocalcemia-corrected calcium  8.1   7.  End-stage renal disease-continue with hemodialysis Monday Wednesday and Friday.  Follow-up with Dr. Burnell.   8.  Elevated troponin-stayed flat.  EKG no acute ST changes.  Chest pain-free   9.  Hypertension-blood pressure stable continue Catapres  0.1 mg twice daily, Exforge 5/160 Toprol -XL 50 mg daily  10.  Diabetes-improved continue Lantus  18 units every morning.  Increased Premeal insulin  to 6 units 3 times daily   11.  Persistent leukocytosis-off of steroids.  Without fever tachycardia or tachypnea procalcitonin improved to 0.31.  Chest x-ray no pneumonia.  Unlikely infectious process consult made to hematology workup ongoing could be inflammatory or delayed reaction from steroids.  Will follow with Dr. Jerri as outpatient.  ID and hematology okay with patient discharging with outpatient follow-up as she is without SIRS criteria   12.  Incidental finding questionable 4 mm nodule in the left upper lung  13.  Hyperkalemia underwent dialysis today  Hospital Course (include Reason for Hospitalization):   This is a 58 year old AA female with past medical history of asthma, hypertension, type 2 diabetes mellitus, ESRD on hemodialysis (MWF), s/p tracheostomy for respiratory  failure during a hospitalization in 2022, left vocal cord paralysis, obstructive sleep apnea, who presented to the ER with complaints of worsening shortness of breath and cough symptoms are worse after the trach was changed with more mucus building up in her throat since the exchange.   7/09: ENT evaluation, Trach tube is widely patent Distal airway is easily seen and has thick white purulent material coming up from both mainstem bronchi. No tracheitis. Recommends outpatient follow up.  Pulmonary medicine also saw the patient in consultation Dr. Dustin.  Patient's asthma exacerbation resolved.  She completed a course of steroid therapy   7/11: s/p hemodialysis.   Patient had issues with persistent leukocytosis without other signs of sepsis.  ID consult was made to Dr.Modjtabai as well as hematology Dr. Jerri.  To investigate leukocytosis.  It was thought that it was perhaps delayed reaction to steroid.  We stopped antibiotics on the 21st.  She had been on cefepime  white blood cell count did not elevate and there was no other SIRS criteria thus it was decided to discharge the patient home with follow-up with her PCP, Dr. VEAR Jerri from hematology.  Day of discharge patient was eager to go no chest pain no shortness of breath.  Physical exam Visit Vitals BP (!) 152/72 (BP Location: Right arm, Patient Position: Sitting)  Pulse 95  Temp 36.6 C (97.8 F) (Oral)  Resp 17  Ht 1.67 m (65.75)  Wt 90 kg (198 lb 6.6 oz)  SpO2 96%  BMI 32.27 kg/m  OB Status Hysterectomy  Smoking Status Never  BSA 1.99 m   General-alert and oriented x 3 no apparent distress Neck-trach Cardio-S1-S2 regular rate and rhythm  Lungs-scant coarse breath sounds Abdomen-nondistended Extremities-no cyanosis clubbing or edema Neuro-focal, power 5/5   Results from last 7 days  Lab Units 07/02/24 0418 07/01/24 0420 06/30/24 0515 06/29/24 0332  WBC AUTO K/mcL 18.0* 19.4* 19.2* 19.5*  HEMOGLOBIN g/dL 8.9* 8.7* 9.5* 9.2*   HEMATOCRIT % 27.4* 27.8* 30.2* 30.4*  PLATELETS K/mcL 173 171 181 193  LYMPHS PCT MAN %  --   --  30.5  --   LYMPHS PCT AUTO % 20.6 22.5  --  38.4  MONO PCT MAN %  --   --  5.7  --   MONO PCT AUTO % 9.6 8.2  --  9.7  EOS PCT AUTO % 1.2 1.1  --  1.1    Results from last 7 days  Lab Units 07/02/24 1146 07/02/24 0418 07/01/24 2054 07/01/24 0735 07/01/24 0420 06/30/24 0751 06/30/24 0515  SODIUM mmol/L  --  135  --   --  134*  --  135  POTASSIUM mmol/L  --  5.6*  --   --  5.0  --  5.1  CHLORIDE mmol/L  --  99  --   --  98  --  99  CO2 mmol/L  --  24  --   --  24  --  25  BUN mg/dL  --  83*  --   --  74*  --  65*  CREATININE mg/dL  --  89.47*  --   --  0.42*  --  8.02*  CALCIUM  mg/dL  --  8.4*  --   --  8.5*  --  8.9  TOTAL PROTEIN g/dL  --  5.9*  --   --  5.7*  --  6.0*  BILIRUBIN TOTAL mg/dL  --  0.3  --   --  0.4  --  0.5  ALK PHOS unit/L  --  81  --   --  68  --  74  ALT unit/L  --  14  --   --  13  --  15  AST unit/L  --  18  --   --  15  --  16  POCT GLUCOSE mg/dL 853*  --  829*   < >  --    < >  --   GLUCOSE mg/dL  --  788*  --   --  848*  --  164*   < > = values in this interval not displayed.      IMAGING XR Chest 1 View [[id number]] Collected: 06/29/24 0948  Order Status: Completed Updated: 06/29/24 0949  Narrative:    REPORT-ID:[id number]  INDICATION: Pneumonia, unresolved  EXAMINATION/TECHNIQUE: X-RAY - XR Chest 1 View  COMPARISON: Prior study dated: 06/23/2024 ____________________________________________  FINDINGS:  LINES/DEVICES: Tracheostomy tube in stable position. LUNGS: Mild prominence of the pulmonary vasculature. No evidence of pleural effusions. MEDIASTINUM AND CARDIOVASCULAR STRUCTURES: Cardiac silhouette not enlarged. Central airways and mediastinal contour are unremarkable. BONES AND SOFT TISSUES: Unremarkable.  Impression:     Mild pulmonary venous congestion.  Electronically Signed: Bassam Zahlan, MD 2024/06/29 at  9:48 EDT Reading Location ID and State: [address] / [place] Tel [phone], Service support  [phone], Fax [phone]  XR Chest 1 View [[id number]] Collected: 06/24/24 0323  Order Status: Completed Updated: 06/24/24 0324  Narrative:    REPORT-ID:[id number]  EXAM:  XR CHEST, 1 VIEW  CLINICAL INDICATION:  pneumonia, worsening wbc  TECHNIQUE:  Frontal view of the chest.  COMPARISON:  06/22/2024.  FINDINGS: LUNGS AND PLEURAL SPACES:  Low  lung volumes limit the exam.  No consolidations. No pneumothorax.  No effusion. HEART:  Unremarkable.  Cardiac silhouette not enlarged. MEDIASTINUM:  Central airways and mediastinal contour are unremarkable. BONES/JOINTS:  Unremarkable.  No acute fracture. SOFT TISSUES:  Unremarkable. TUBES, LINES AND DEVICES:  Tracheostomy tube remains in good position.  Impression:     1.  Low lung volumes limit the exam. No acute cardiopulmonary abnormality.  2.  Tracheostomy tube remains in good position.  Electronically Signed: Ozell Senters, MD 2024/06/24 at 3:23 EDT Reading Location ID and State: [address] / [place] Tel [phone], Service support  [phone], Fax [phone]  XR Chest 1 View [[id number]] Collected: 06/23/24 2140  Order Status: Completed Updated: 06/23/24 2141  Narrative:    REPORT-ID:[id number]  INDICATION: dyspnea- evaluate for resolution of pulmonary edema after intense dialysis  EXAMINATION/TECHNIQUE: X-RAY - XR Chest 1 View  Comparison: June 17, 2024  A single view of the chest.  There is a tracheostomy tube present. Heart is normal in size. Lungs are under expanded. There is linear atelectasis at the left base. There is no acute infiltrate, effusion or pneumothorax. No significant congestive changes are seen. Mild degenerative disc disease changes are seen in the spine.  Impression:    Underexpanded lungs with linear atelectasis at the left base.  Electronically Signed: Selinda Georgi, MD 2024/06/23 at 21:40 EDT Reading Location ID and State: [address] / [place] Tel [phone], Service support  [phone], Fax [phone]  XR Chest 1 View [[id number]] Collected: 06/17/24 0217  Order Status: Completed Updated: 06/17/24 0217  Narrative:    REPORT-ID:[id number]  INDICATION: Shortness of breath  EXAMINATION/TECHNIQUE: X-RAY - XR Chest 1 View  COMPARISON: Prior study dated: 06/14/2024 ____________________________________________  FINDINGS:  LINES/DEVICES: Tracheostomy tube is above the carina, unchanged. LUNGS: Low lung volumes. Prominence of interstitial lung markings appears relatively increased compared to prior. No consolidation.  No pneumothorax. MEDIASTINUM: Unremarkable. CARDIAC SILHOUETTE: Not enlarged. BONES AND SOFT TISSUES: No acute abnormalities. ____________________________________________  Impression:     Increased interstitial opacities may be mild degree of edema or inflammatory/pneumonitis.  Electronically Signed: Elenor Simpers, MD 2024/06/17 at 2:17 EDT Reading Location ID and State: [address] / [place] Tel [phone], Service support  [phone], Fax [phone]  CT Chest wo Contrast [[id number]] Collected: 06/14/24 1838  Order Status: Completed Updated: 06/14/24 1839  Narrative:    REPORT-ID:[id number]  INDICATION: Chest pain or SOB, pleurisy or effusion suspected- Cough, persistent- Dyspnea, chronic, unclear etiology- Respiratory illness, nondiagnostic xray  EXAMINATION: CT CHEST WITHOUT CONTRAST - CT Chest W/O Contrast Injection  TECHNIQUE: Helically acquired images were obtained of the chest. The protocol utilizes one or more of the following dose reduction techniques: automated exposure control, adjustment of mA and/or kV according to patient size,and/or use of iterative reconstruction technique.  IV Contrast dosage and agent: None.  COMPARISON:  FINDINGS:  LUNGS, PLEURA AND LARGE  AIRWAYS: No masses, consolidation, or edema.  No pleural effusion or thickening.  No pneumothorax. Tracheostomy tube in place. Questionable 4 mm intraluminal nodule within the proximal left upper lobe bronchus, image 24 series 3.  THYROID: No thyroid lesions.  HEART AND PERICARDIUM: Heart size is normal.  No pericardial effusion.  CORONARY ARTERIES: Coronary artery calcifications are noted  VESSELS: Thoracic aorta is not dilated.  MEDIASTINUM AND HILA: No mediastinal or hilar adenopathy.  Esophagus is unremarkable.  No hiatal hernia.  UPPER ABDOMEN: No acute pathology.  BONES: No suspicious lytic or blastic abnormality.  Impression:     Questionable 4 mm intraluminal nodule within the proximal  left upper lobe bronchus.  Electronically Signed: Norah Crochet, DO 2024/06/14 at 18:38 EDT Reading Location ID and State: [address] / [place] Tel [phone], Service support  [phone], Fax [phone]  XR Chest 1 View [[id number]] Collected: 06/14/24 1330  Order Status: Completed Updated: 06/14/24 1331  Narrative:    REPORT-ID:[id number]  EXAM:  XR CHEST, 1 VIEW  CLINICAL INDICATION:  Shortness of breath  TECHNIQUE:  Frontal view of the chest.  COMPARISON:  XR Chest dated 05/19/2024  FINDINGS:  LUNGS AND PLEURAL SPACES:  Normal. No consolidation or edema.  No pneumothorax. No effusion.  HEART:  Normal heart size.  MEDIASTINUM:  No mediastinal or hilar mass.  BONES/JOINTS:  No acute abnormality.  TUBES, LINES AND DEVICES:  Tracheostomy tube remains in place.  Impression:     No acute cardiopulmonary abnormality. No interval change.   Test Results Pending At Discharge: Pending Labs     Order Current Status   Clostridium difficile molecular study Collected (06/23/24 1416)   Leukemia, lymphoma evaluation by flow cytometry In process   Leukemia, lymphoma evaluation by flow cytometry In process       Outpatient Follow-Up [place]: [place] [address]  [address] [place] [address] [phone]  Follow up in 3 day(s)   Lajune Ashdown, MD [address] [place] [address] [phone]  Follow up in 1 week(s)   Lynwood Cummins, MD [address] [place] [address] [phone]  Follow up in 1 week(s)   Courtland Drayton, MD [address] [place] [address] [phone]  Schedule an appointment as soon as possible for a visit     Discharge Medication List:   Your medication list     START taking these medications      Instructions Last Dose Given Next Dose Due  budesonide -formoteroL  80-4.5 mcg/actuation inhaler Commonly known as: SYMBICORT    Inhale 2 puffs by mouth 2 (two) times a day. Rinse mouth with water  after use to reduce aftertaste and incidence of candidiasis. Do not swallow.     calcitrioL 0.25 mcg capsule Commonly known as: ROCALTROL Start taking on: July 03, 2024    Take 1 capsule (0.25 mcg total) by mouth 1 (one) time each day.     dextromethorphan -guaiFENesin  10-100 mg/5 mL syrup Commonly known as: ROBITUSSIN-DM    Take 10 mL by mouth every 4 (four) hours if needed for cough for up to 10 days.         CHANGE how you take these medications      Instructions Last Dose Given Next Dose Due  insulin  lispro 100 unit/mL injection What changed: how much to take    Inject 6 Units under the skin 3 (three) times a day before meals. -Administer within 15 minutes of a meal     Tresiba  FlexTouch U-200 200 unit/mL (3 mL) CONCENTRATED injection pen Generic drug: insulin  degludec What changed: how much to take    Inject 18 Units under the skin at bedtime. HOLD BLOOD GLUCOSE <120, Use Home Supply         CONTINUE taking these medications      Instructions Last Dose Given Next Dose Due  albuterol  HFA 90 mcg/actuation inhaler Commonly known as: PROAIR  HFA ; PROVENTIL  HFA ; VENTOLIN  HFA    Inhale 2 puffs by mouth every 4 (four) hours if needed  for wheezing.     amLODIPine -valsartan 5-160 mg per tablet Commonly known as: EXFORGE    Take 1 tablet  by mouth 1 (one) time each day.     azelastine  137 mcg (0.1 %) nasal spray Commonly known as: ASTELIN     Administer 1 spray into each nostril at bedtime. Use in each nostril as directed     cloNIDine  0.1 mg tablet Commonly known as: CATAPRES     Take 1 tablet (0.1 mg total) by mouth 2 (two) times a day if needed for high blood pressure.     fluticasone  propionate 50 mcg/actuation nasal spray Commonly known as: FLONASE     Administer 1 spray into each nostril 2 (two) times a day. Shake gently. Before first use, prime pump. After use, clean tip and replace cap.     levocetirizine 5 mg tablet Commonly known as: XYZAL     Take 1 tablet (5 mg total) by mouth 1 (one) time each day in the evening.     metoprolol  succinate 50 mg 24 hr tablet Commonly known as: TOPROL -XL    Take 1 tablet (50 mg total) by mouth 1 (one) time each day.     rosuvastatin 20 mg tablet Commonly known as: CRESTOR    1 tablet (20 mg total).     sevelamer  800 mg tablet Commonly known as: RENAGEL     Take 2 tablets (1,600 mg total) by mouth 3 (three) times a day with meals. Swallow tablet whole; do not crush, break, or chew.         STOP taking these medications    famotidine  20 mg tablet Commonly known as: PEPCID      hydrALAZINE  25 mg tablet Commonly known as: APRESOLINE      hydrALAZINE  50 mg tablet Commonly known as: APRESOLINE      Ozempic 1 mg/dose (4 mg/3 mL) injection pen Generic drug: semaglutide     torsemide  20 mg tablet Commonly known as: DEMADEX            Where to Get Your Medications     These medications were sent to [id number] - [place] , [place] - [address] AT [facility] [address]  [address] [place], [place]  [place] [address]    Phone: [phone]  amLODIPine -valsartan 5-160 mg per tablet budesonide -formoteroL  80-4.5 mcg/actuation  inhaler calcitrioL 0.25 mcg capsule dextromethorphan -guaiFENesin  10-100 mg/5 mL syrup insulin  lispro 100 unit/mL injection metoprolol  succinate 50 mg 24 hr tablet Tresiba  FlexTouch U-200 200 unit/mL (3 mL) CONCENTRATED injection pen     Patient Condition and Disposition at Time of Discharge:   Discharge Instructions: Discharge Procedure Orders  Discharge Diet: Cardiac Heart Healthy Diet (Low Cholesterol / Low Fat / No Added Salt)   Order Specific Question Answer Comments  Discharge diet you should follow at home Cardiac Heart Healthy Diet (Low Cholesterol / Low Fat / No Added Salt)    Discharge Diet: Diabetic 2000 Calorie Diet, Renal Diet   Order Specific Question Answer Comments  Discharge diet you should follow at home Diabetic 2000 Calorie Diet   Discharge diet you should follow at home Renal Diet    Notify provider - General   Order Specific Question Answer Comments  Reason(s) If your symptoms return or worsen   Reason(s) If you experience severe uncontrolled pain or pain not relieved by medication   Reason(s) If you experience severe lightheadedness or dizziness   Reason(s) If you experience a fever above 101 degree Fahrenheit (38.3 degrees Celsius) or chills   Reason(s) If you experience increased confusion, irritability, slurred or incoherent speech   Reason(s) If you experience increasing drowsiness or extreme fatigue   Reason(s) If  you experience difficulty breathing, headache, or visual disturbances   Reason(s) If you experience an inability to eat, drink, or take medication   Reason(s) If you experience persistent nausea or vomiting   Reason(s) If you experience decreased urine output, increased heart rate, facial flushing and/or any other signs of dehydration   Reason(s) If you experience skin that becomes itchy, red, swollen and/or a new rash   Reason(s) If you experience unusual weight gain or loss    No restrictions, resume your usual activities  Order  Comments: As tolerated  Time spent 35 minutes

## 2024-07-03 NOTE — Claim Note (Signed)
 Case Manager/Utilization Note  Admission Information Maureen Jones, Maureen Jones   MRN: [id number]   AGE: 58 y.o.  Sex: female Admission Date: 06/17/2024    07/02/2024 - Discharge disposition: Home or Self Care

## 2024-07-31 ENCOUNTER — Telehealth: Payer: Self-pay

## 2024-07-31 NOTE — Telephone Encounter (Signed)
 Called the requesting company of [employer] and spoke with [place]. I informed her that our office received 3 different confirmation of order forms from their office on this patient. I informed her that the patient is no longer under the care of Roselie Mood, NP last seen in February 2024. She thanked me for calling and stated that she will reach out to the patient.

## 2024-08-09 NOTE — Telephone Encounter (Signed)
 Received another fax from [employer] for trach supplies for patient. I called and spoke with Vickie and informed her that I call on 08/02/24 and spoke with someone informing that the patient is no longer under the care of Roselie Mood, NP. She stated that she will send an email to the person who is faxing the order form to make aware

## 2024-08-22 ENCOUNTER — Telehealth: Payer: Self-pay

## 2024-08-22 NOTE — Telephone Encounter (Signed)
 Received a fax from patient's pharmacy requesting refills fro [employer] and for Levocetirizine 5 mg. Called patient's pharmacy and spoke with [facility] and informed her of why I was calling. She asked if I know who the patients new PCP is and I informed her that information I do not have she will need to contact patient for that.

## 2024-09-25 ENCOUNTER — Other Ambulatory Visit: Payer: Self-pay | Admitting: Nurse Practitioner

## 2024-09-25 DIAGNOSIS — E1169 Type 2 diabetes mellitus with other specified complication: Secondary | ICD-10-CM

## 2024-09-26 NOTE — Telephone Encounter (Signed)
Patient no longer under providers care.
